# Patient Record
Sex: Female | Born: 1955
Health system: Southern US, Community
[De-identification: ages and names within clinical notes are randomized; demographics above are authoritative.]

## PROBLEM LIST (undated history)

## (undated) DIAGNOSIS — Z973 Presence of spectacles and contact lenses: Secondary | ICD-10-CM

## (undated) DIAGNOSIS — N186 End stage renal disease: Secondary | ICD-10-CM

## (undated) DIAGNOSIS — Z9289 Personal history of other medical treatment: Secondary | ICD-10-CM

## (undated) DIAGNOSIS — F329 Major depressive disorder, single episode, unspecified: Secondary | ICD-10-CM

## (undated) DIAGNOSIS — N189 Chronic kidney disease, unspecified: Secondary | ICD-10-CM

## (undated) DIAGNOSIS — H269 Unspecified cataract: Secondary | ICD-10-CM

## (undated) DIAGNOSIS — F32A Depression, unspecified: Secondary | ICD-10-CM

## (undated) DIAGNOSIS — E039 Hypothyroidism, unspecified: Secondary | ICD-10-CM

## (undated) DIAGNOSIS — C539 Malignant neoplasm of cervix uteri, unspecified: Secondary | ICD-10-CM

## (undated) DIAGNOSIS — N2581 Secondary hyperparathyroidism of renal origin: Secondary | ICD-10-CM

## (undated) DIAGNOSIS — N1330 Unspecified hydronephrosis: Secondary | ICD-10-CM

## (undated) DIAGNOSIS — I739 Peripheral vascular disease, unspecified: Secondary | ICD-10-CM

## (undated) DIAGNOSIS — D509 Iron deficiency anemia, unspecified: Secondary | ICD-10-CM

## (undated) DIAGNOSIS — G4700 Insomnia, unspecified: Secondary | ICD-10-CM

## (undated) HISTORY — DX: Peripheral vascular disease, unspecified: I73.9

## (undated) HISTORY — DX: Depression, unspecified: F32.A

## (undated) HISTORY — PX: TUBAL LIGATION: SHX77

## (undated) HISTORY — PX: URETER SURGERY: SHX823

## (undated) NOTE — *Deleted (*Deleted)
Critical lab: Hemoglobin-6.6 Dr. Cyd Silence paged.

---
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
## 1997-08-01 ENCOUNTER — Other Ambulatory Visit: Admission: RE | Admit: 1997-08-01 | Discharge: 1997-08-01 | Payer: Self-pay | Admitting: Gynecology

## 2001-04-06 ENCOUNTER — Other Ambulatory Visit: Admission: RE | Admit: 2001-04-06 | Discharge: 2001-04-06 | Payer: Self-pay | Admitting: Gynecology

## 2004-03-16 DIAGNOSIS — N1330 Unspecified hydronephrosis: Secondary | ICD-10-CM

## 2004-03-16 DIAGNOSIS — C539 Malignant neoplasm of cervix uteri, unspecified: Secondary | ICD-10-CM

## 2004-03-16 HISTORY — DX: Unspecified hydronephrosis: N13.30

## 2004-03-16 HISTORY — DX: Malignant neoplasm of cervix uteri, unspecified: C53.9

## 2004-05-29 ENCOUNTER — Encounter: Admission: RE | Admit: 2004-05-29 | Discharge: 2004-05-29 | Payer: Self-pay | Admitting: Internal Medicine

## 2004-05-29 IMAGING — US US RETROPERITONEAL COMPLETE
1 series · 14 of 25 positions shown · non-contrast
Comparison: none

CLINICAL DATA: Acute renal failure.
 ULTRASOUND OF THE KIDNEYS:
 Scans over the kidneys were performed.  There is moderate bilateral hydronephrosis present.  The ureters are dilated in to the pelvis.  The right kidney measures 10.7 cm sagittally with the left kidney measuring 11.8 cm.  The urinary bladder is grossly normal.  CT of the abdomen and pelvis are recommended to assess for the cause of bilateral hydronephrosis.

[Series 1: unknown · 0.26mm/px · 14 of 42 slices shown]
[im 1/42]
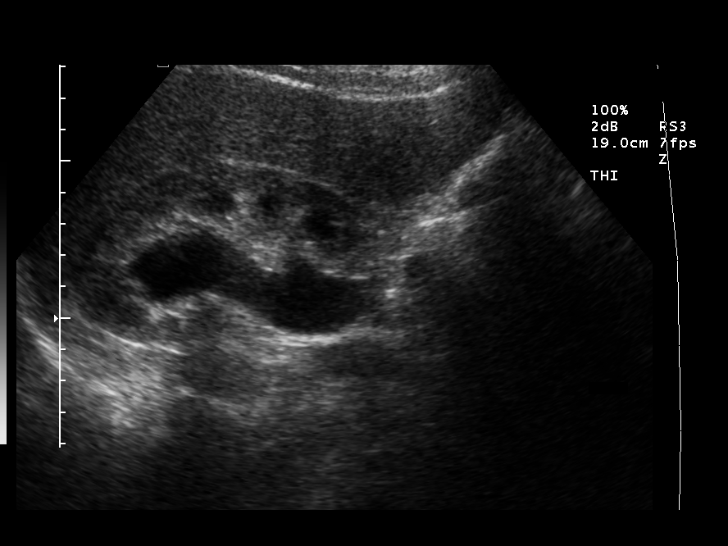
[im 4/42]
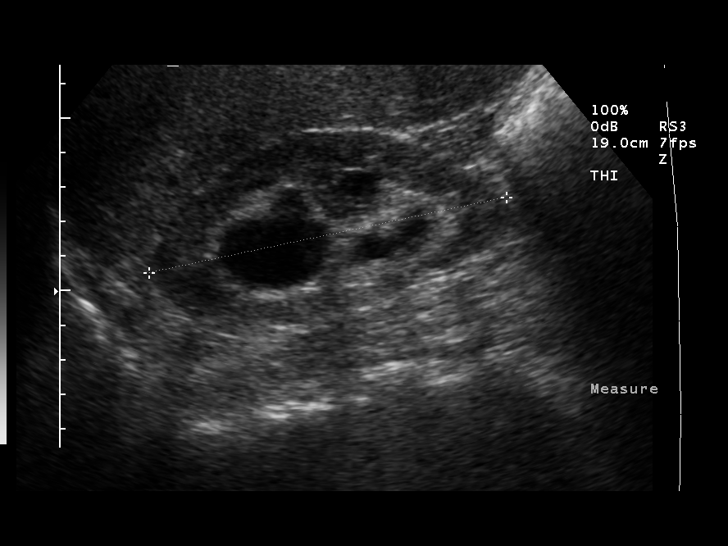
[im 7/42]
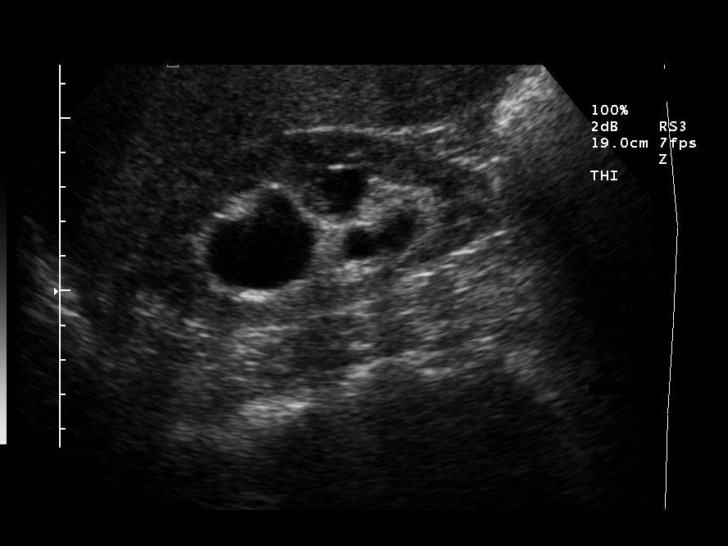
[im 11/42]
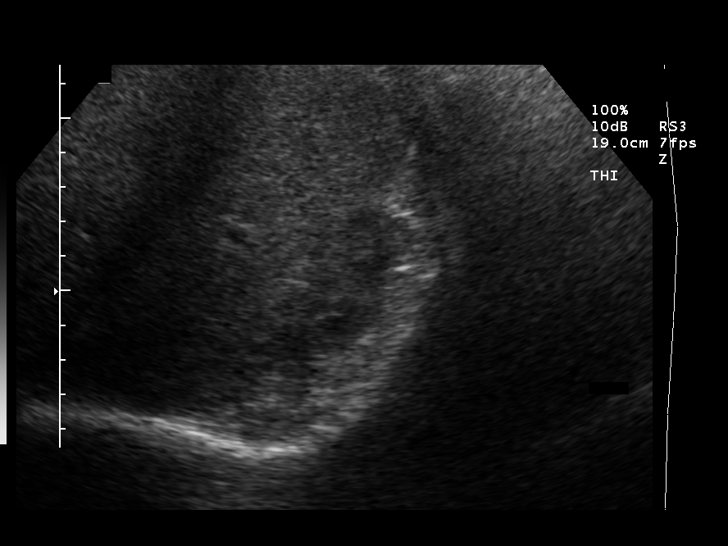
[im 14/42]
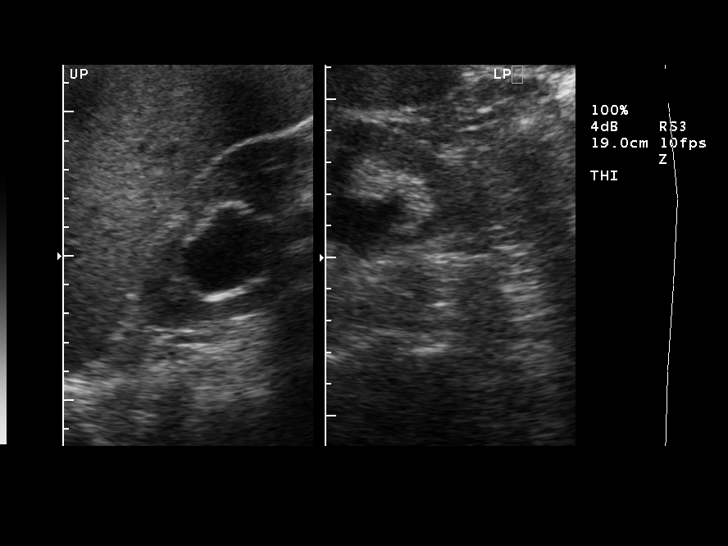
[im 16/42]
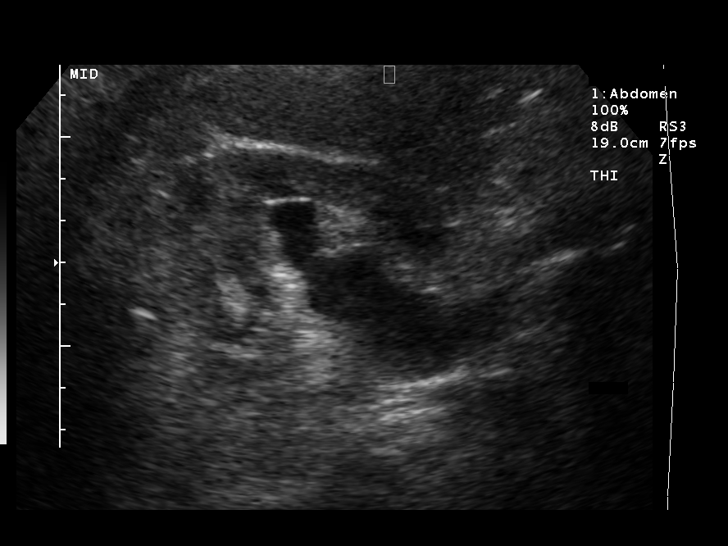
[im 19/42]
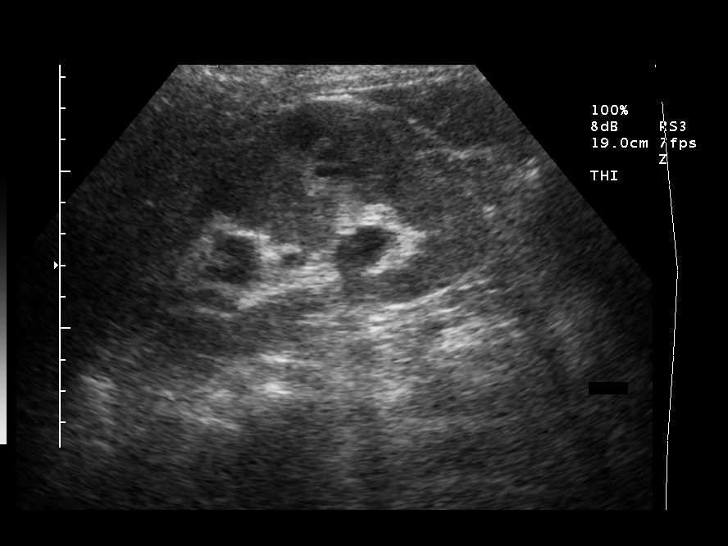
[im 23/42]
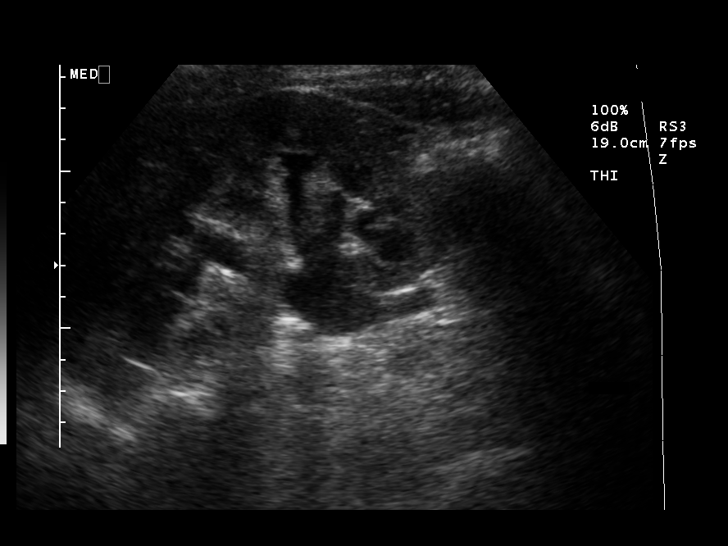
[im 26/42]
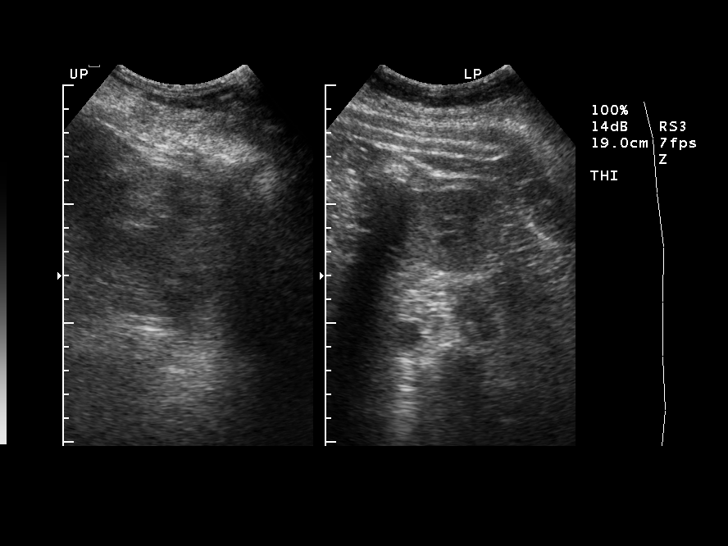
[im 28/42]
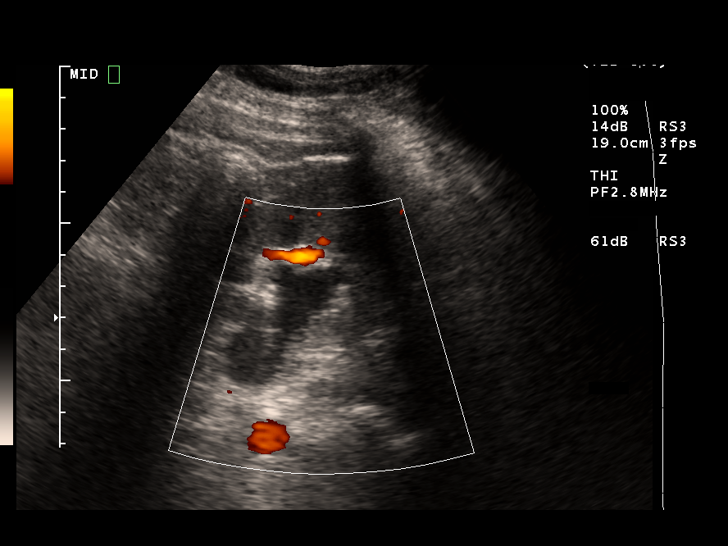
[im 31/42]
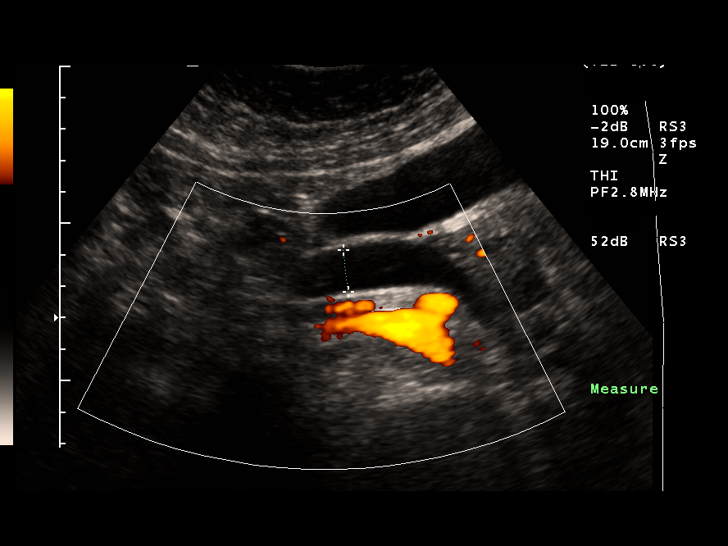
[im 35/42]
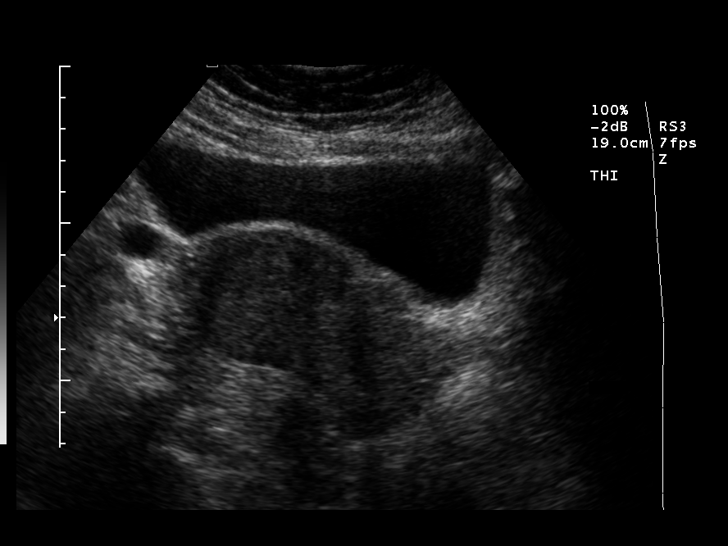
[im 38/42]
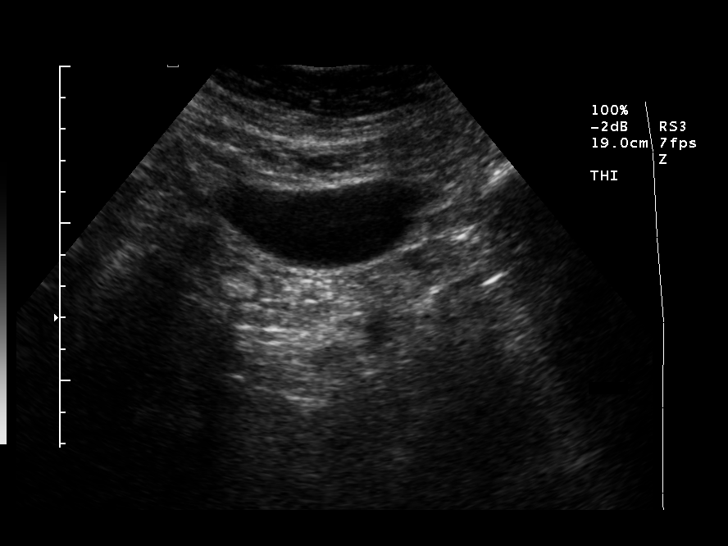
[im 42/42]
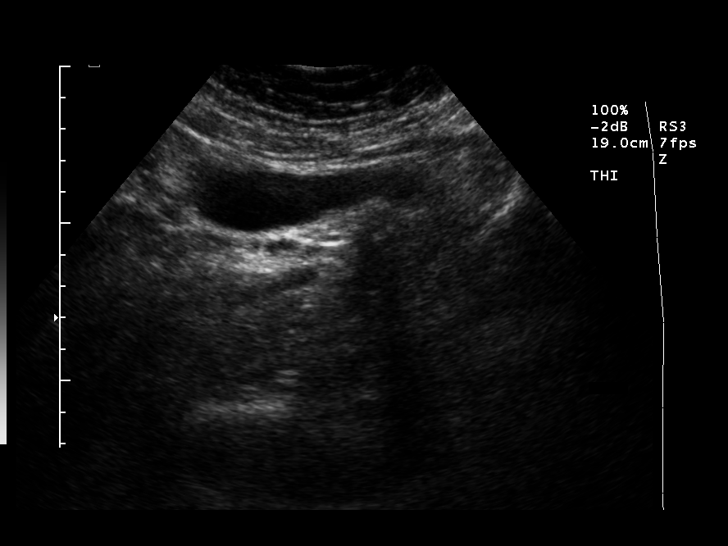

[14 of 25 positions shown; findings below may reference images not displayed]

IMPRESSION: 1.  Moderate bilateral hydronephrosis.  Suggest CT of the abdomen and pelvis to assess further. 
 2.  Incidental gallstones.

## 2004-05-29 IMAGING — CT CT PELVIS W/O CM
1 of 2 series · 15 of 32 positions shown, 19 images · IV contrast (agent unspecified)
Comparison: none

CLINICAL DATA: Acute renal failure. 
ABDOMINAL CT ? NO CONTRAST:

[Series 2: — · axial · 0.76mm/px · z∈[-384,+11]mm · 15 of 87 slices shown, 19 images]
[im 4/87  soft-tissue]
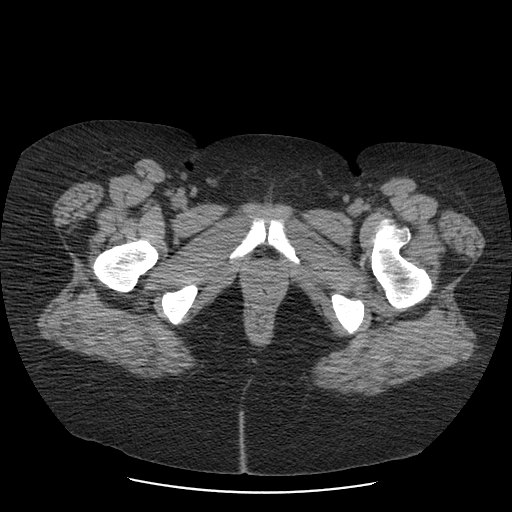
[im 4/87  bone]
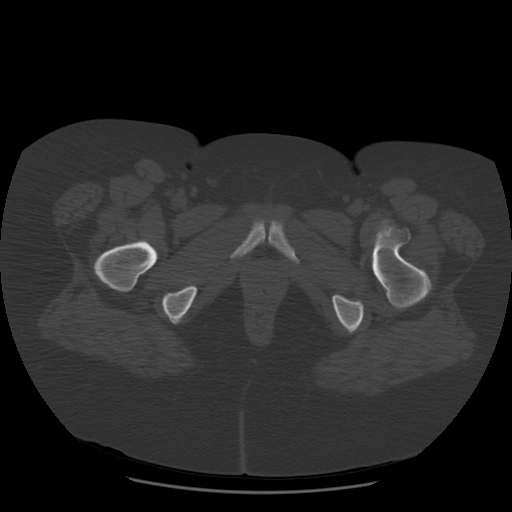
[im 12/87  soft-tissue]
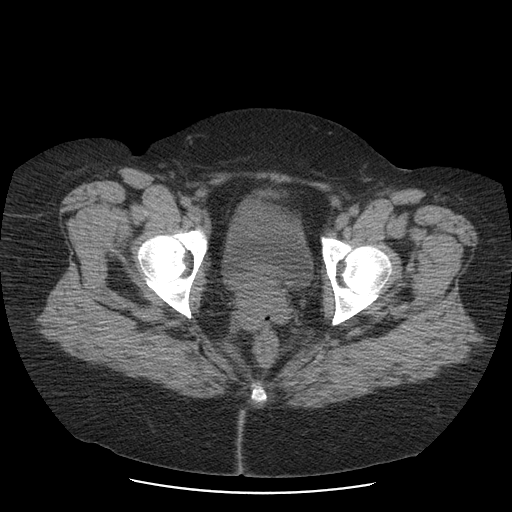
[im 19/87  soft-tissue]
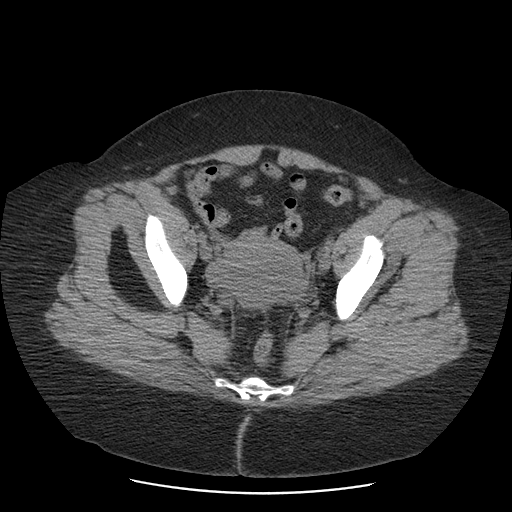
[im 23/87  soft-tissue]
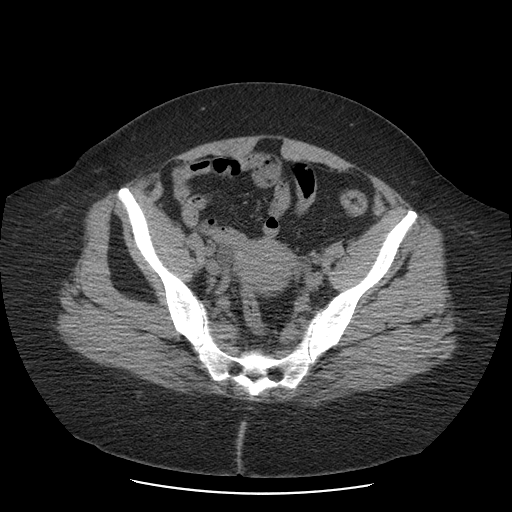
[im 30/87  soft-tissue]
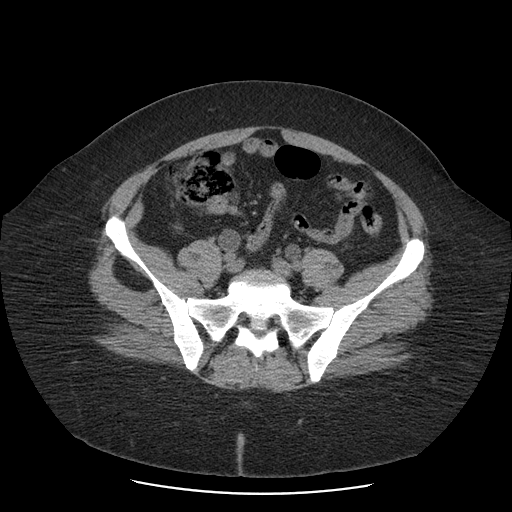
[im 38/87  soft-tissue]
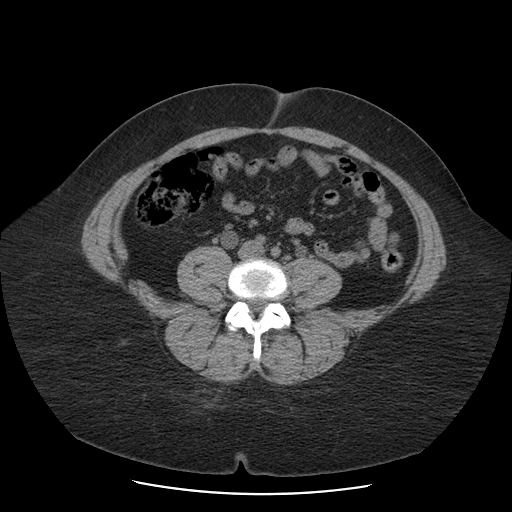
[im 45/87  soft-tissue]
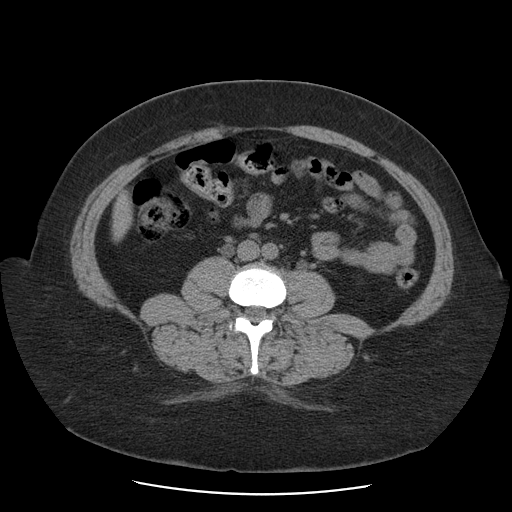
[im 49/87  soft-tissue]
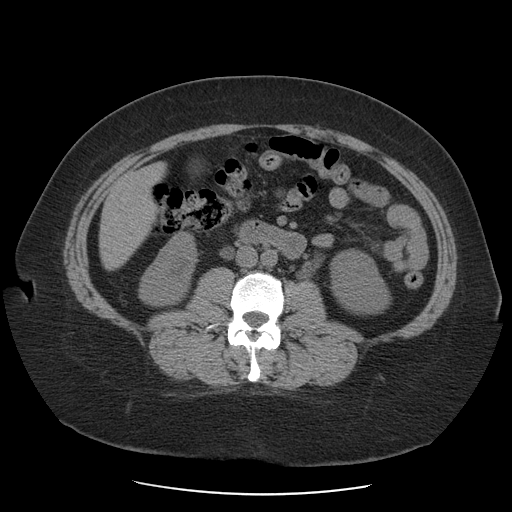
[im 57/87  soft-tissue]
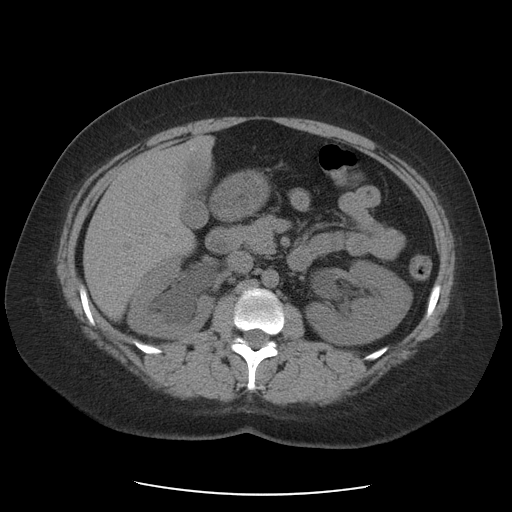
[im 57/87  bone]
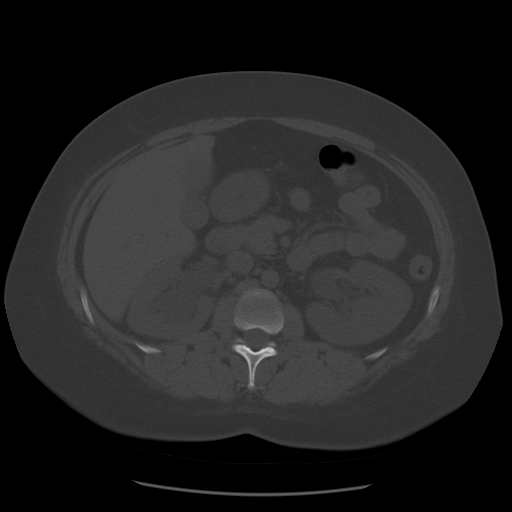
[im 64/87  soft-tissue]
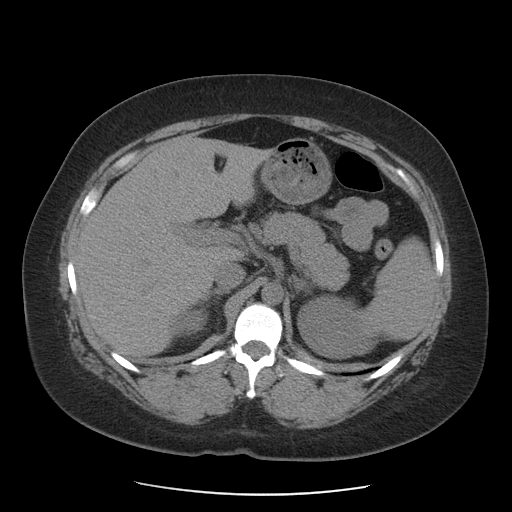
[im 68/87  soft-tissue]
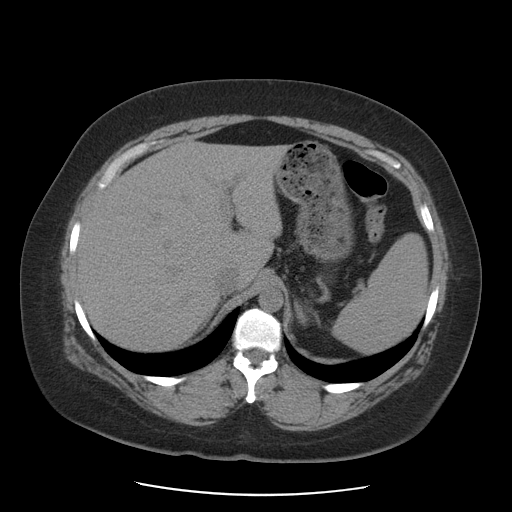
[im 72/87  lung]
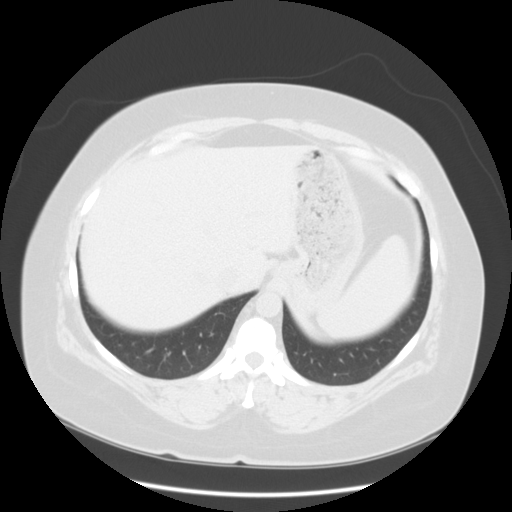
[im 75/87  soft-tissue]
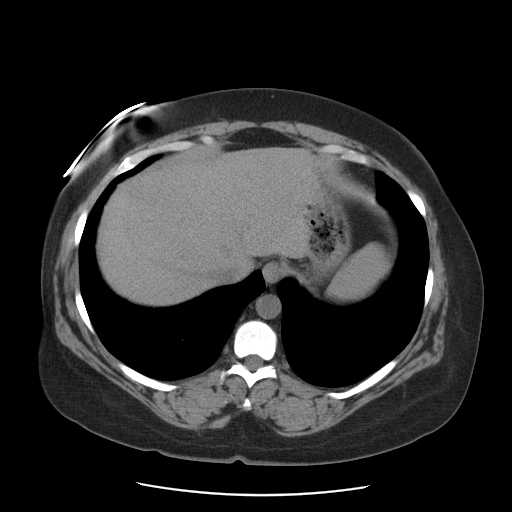
[im 75/87  lung]
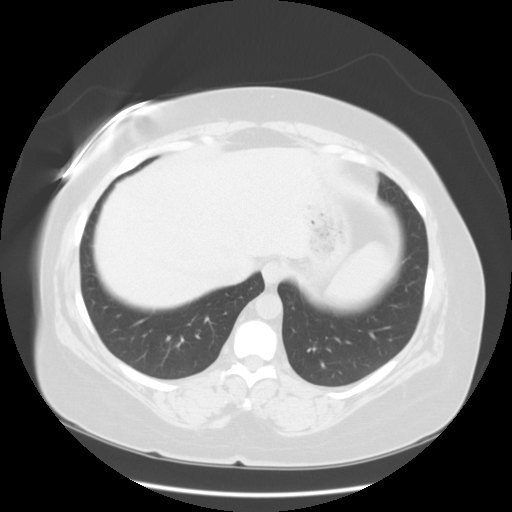
[im 79/87  lung]
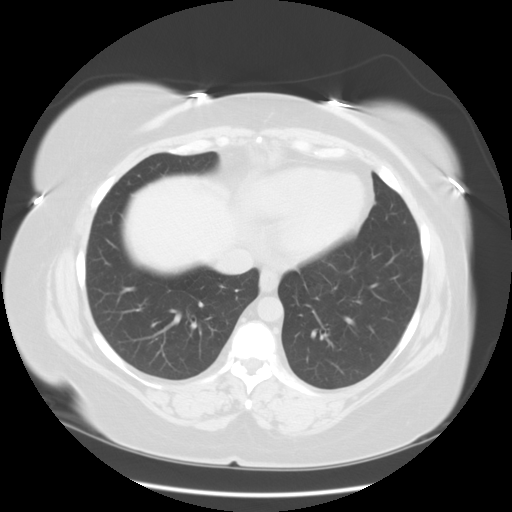
[im 83/87  soft-tissue]
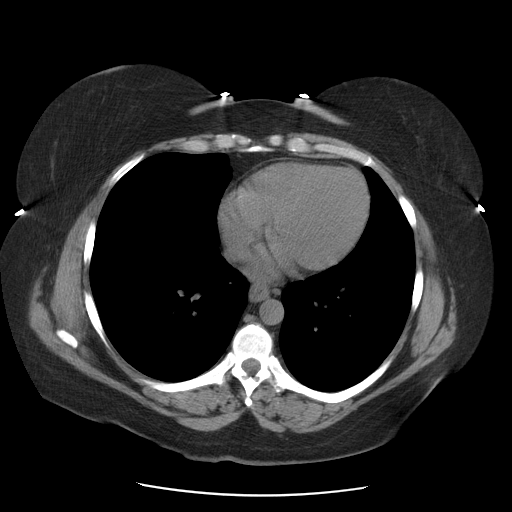
[im 83/87  lung]
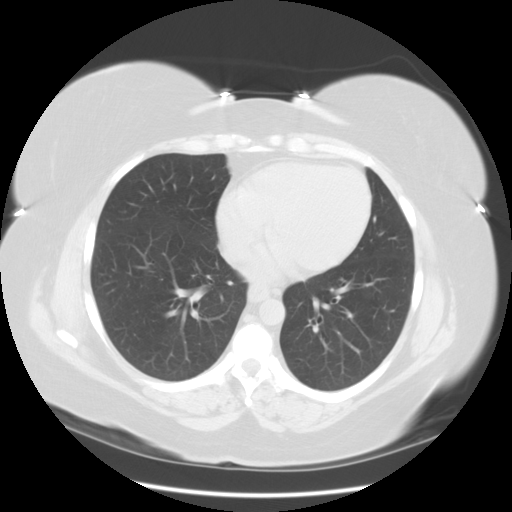

[15 of 32 positions shown; findings below may reference images not displayed]

FINDINGS: CT urogram technique with no oral nor IV contrast demonstrates marked bilateral hydronephrosis and hydroureter to the level of the distal pelvis.  Etiology of obstruction is not currently determined with no CT evidence for obstructing distal ureteral calculi.  Non-obstructing 2.2 cm gallbladder neck gallstone is seen.  Clear lung bases are noted.  The remaining abdominal organs appear normal allowing for no contrast with no adenopathy.  Right gluteal lipoma is seen interposed between the right gluteus medius and minimus.
IMPRESSION: 1.  Marked bilateral hydronephrosis and hydroureter with etiology of distal ureteral obstruction not determined on current study.
2.  Non-obstructing 2.2 cm gallstone.
3.  Incidental right gluteal lipoma.
4.  Otherwise negative.
PELVIC CT ? NO CONTRAST:
FINDINGS: CT urogram technique with no oral nor IV contrast demonstrates dilated right greater than left distal ureters (image 71) with no obstructing mass nor calculus identified.  The etiology of the obstruction is not determined on the current study.  No other significant abnormality is seen with specifically normal air-filled appendix noted and right gluteal lipoma seen.
IMPRESSION: 1.  Distal ureterectasis with etiology not determined on pelvic CT.
2.  Right gluteal lipoma.

## 2004-05-30 ENCOUNTER — Inpatient Hospital Stay (HOSPITAL_COMMUNITY): Admission: EM | Admit: 2004-05-30 | Discharge: 2004-06-03 | Payer: Self-pay | Admitting: *Deleted

## 2004-06-01 IMAGING — US US PELVIS COMPLETE MODIFY
1 series · 14 of 25 positions shown · non-contrast
Comparison: none

CLINICAL DATA: pelvic pain; acute renal failure secondary to ureteral obstruction; bilateral ureteral stent placement
 TRANSVAGINAL NON-OB/PELVIS ULTRASOUND:
 The uterus is normal in size and contour measuring 8.1 x 5.5 x 5.2cm in size.  There is a sonolucent area seen within the fundal portion of the uterus.  This may be fluid located superiorly within the endometrial cavity.  This also may represent a relatively hypoechoic uterine fibroid.  Followup study over time would be helpful (4 to 6 weeks) with attenuation to this area.  The right ovary is normal in size and contour.  The left ovary could not be visualized.  There is no free pelvic fluid.  The endometrial stripe thickness measures 7mm.

[Series 1: gyn · 0.38mm/px · 14 of 39 slices shown]
[im 1/39]
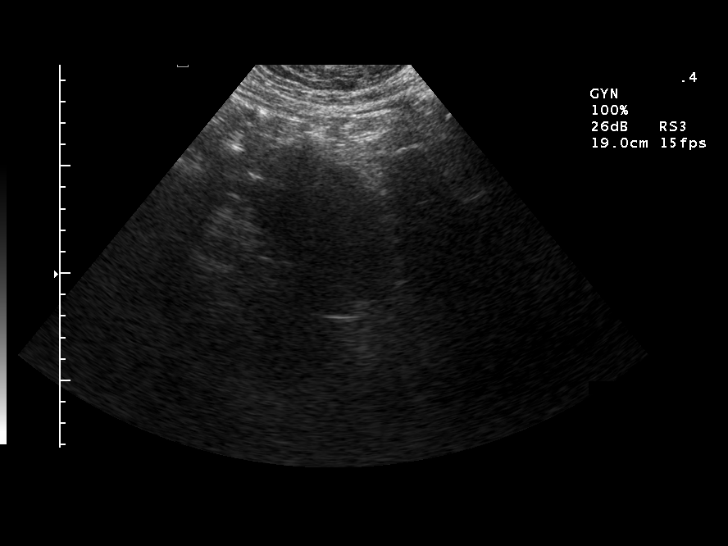
[im 4/39]
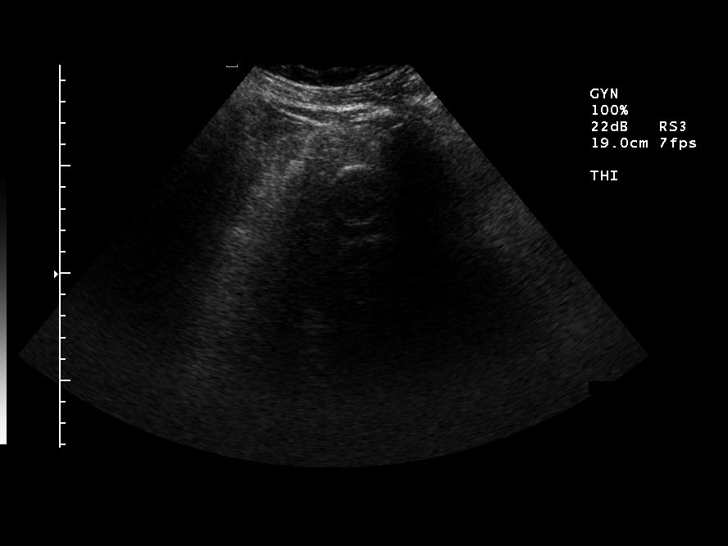
[im 7/39]
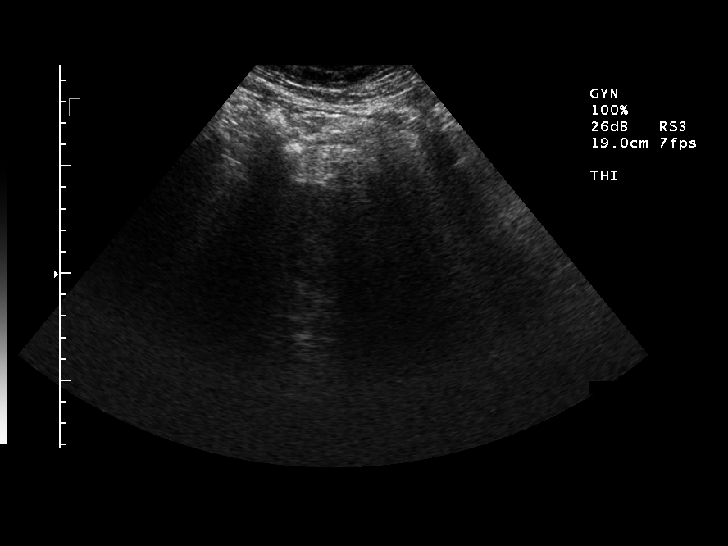
[im 10/39]
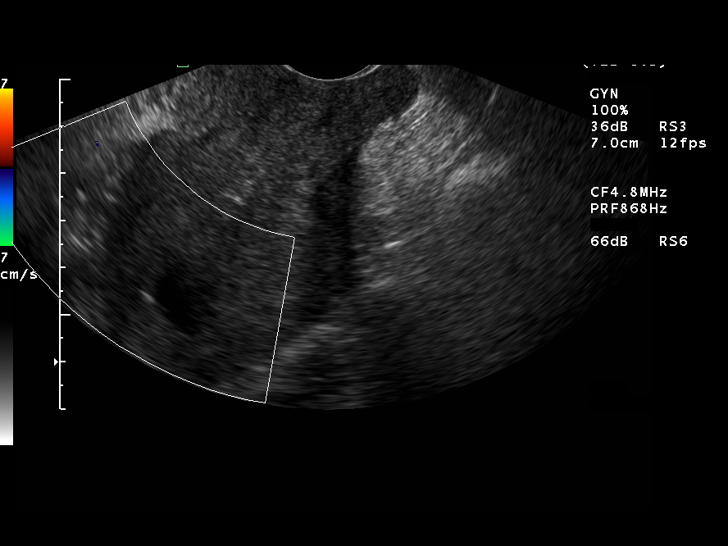
[im 13/39]
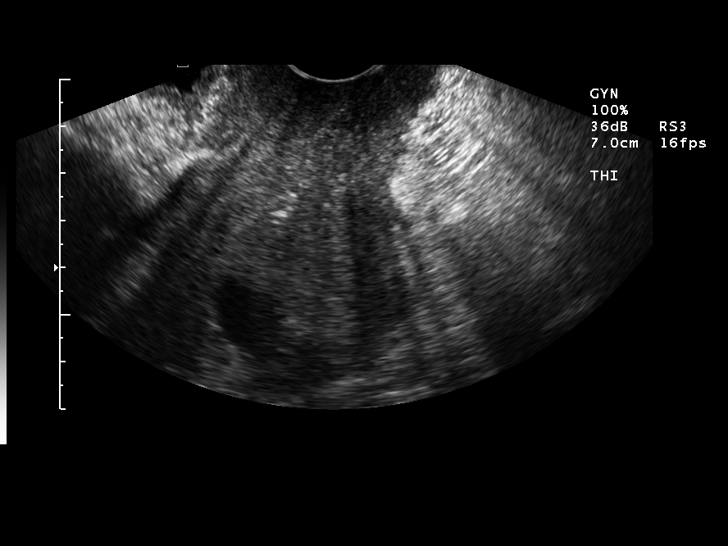
[im 15/39]
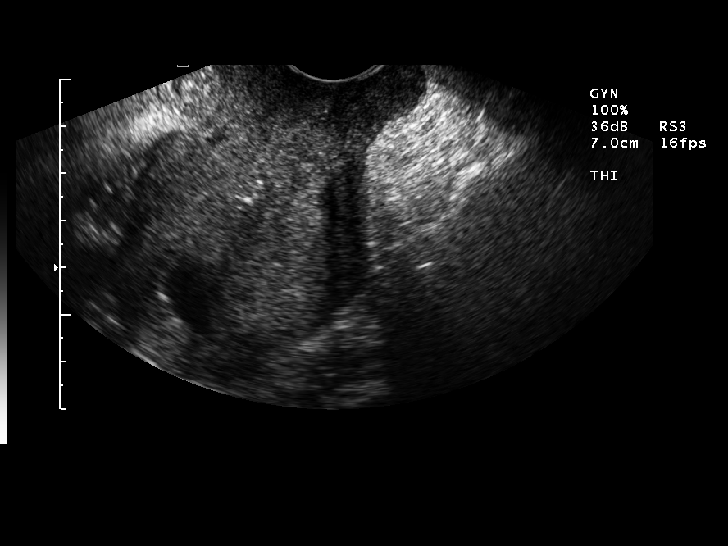
[im 18/39]
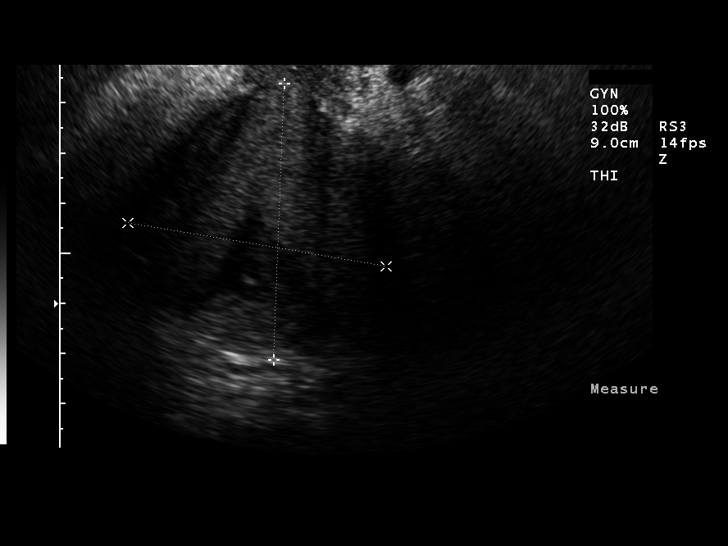
[im 21/39]
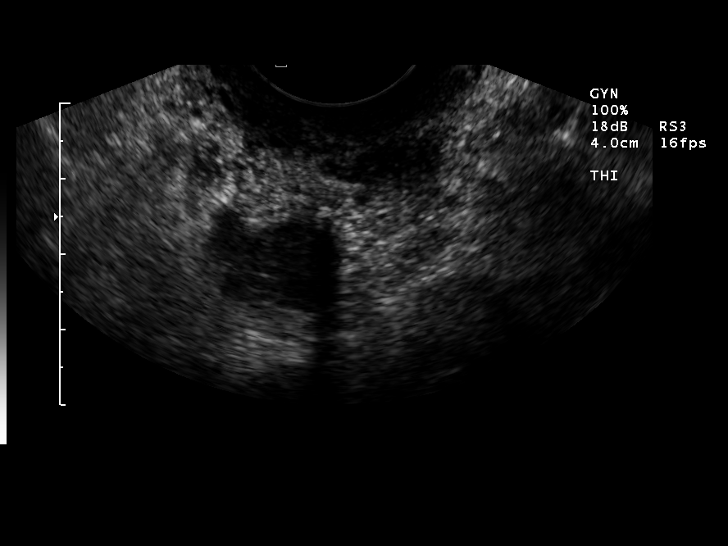
[im 24/39]
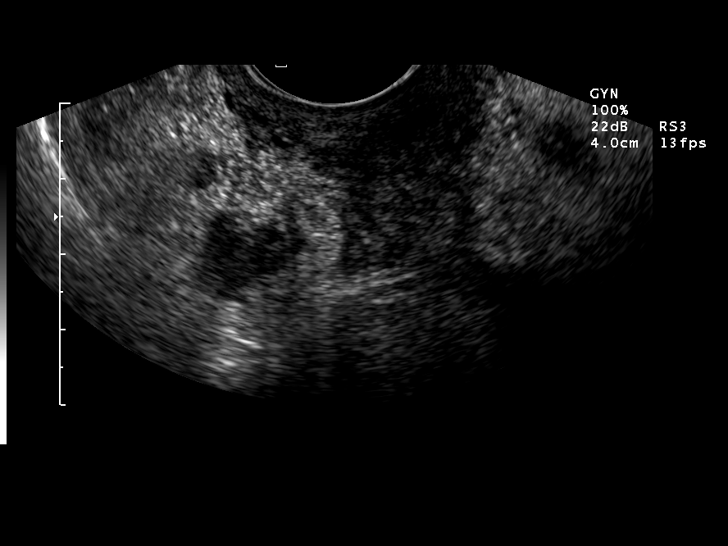
[im 26/39]
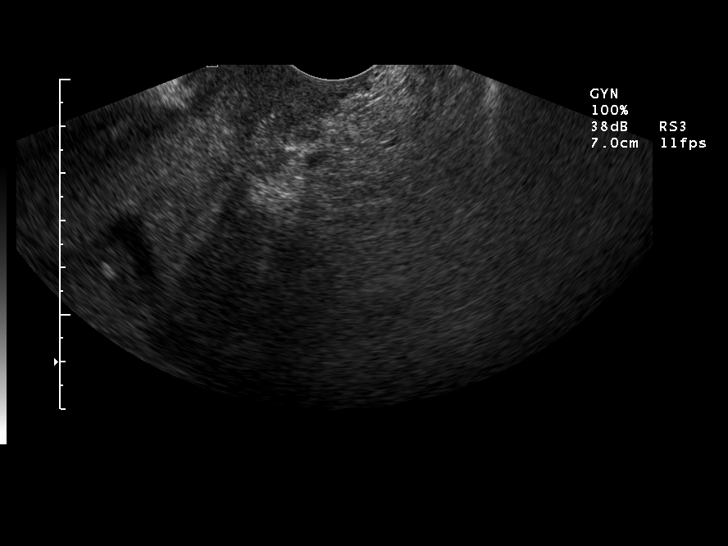
[im 29/39]
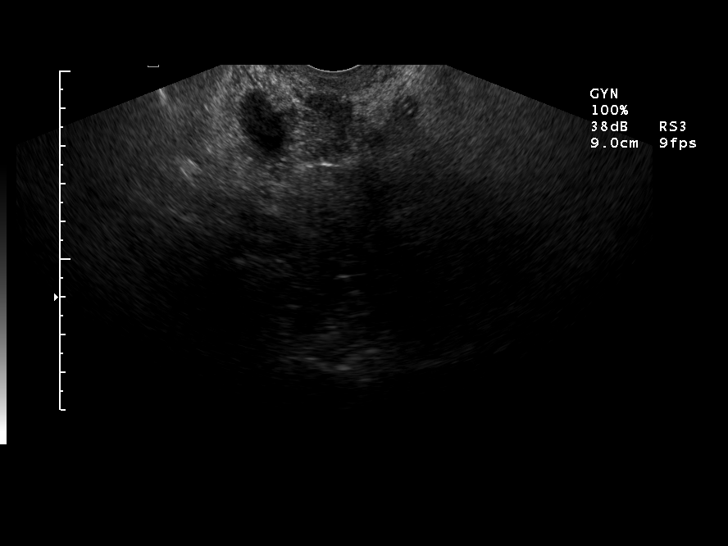
[im 32/39]
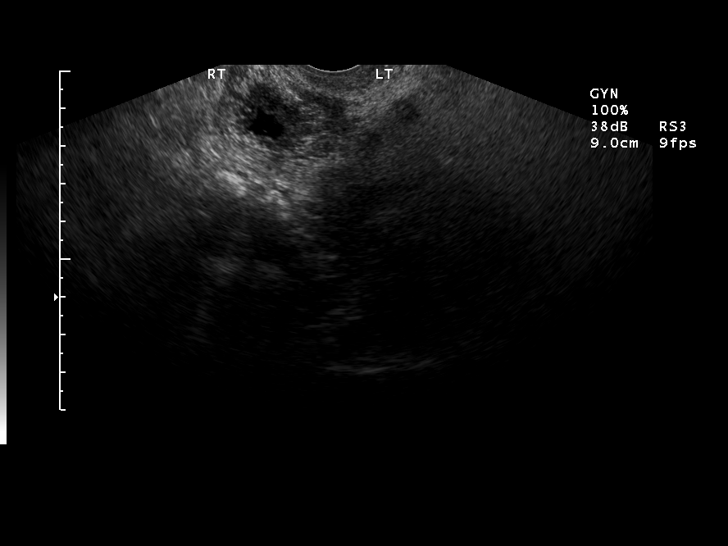
[im 35/39]
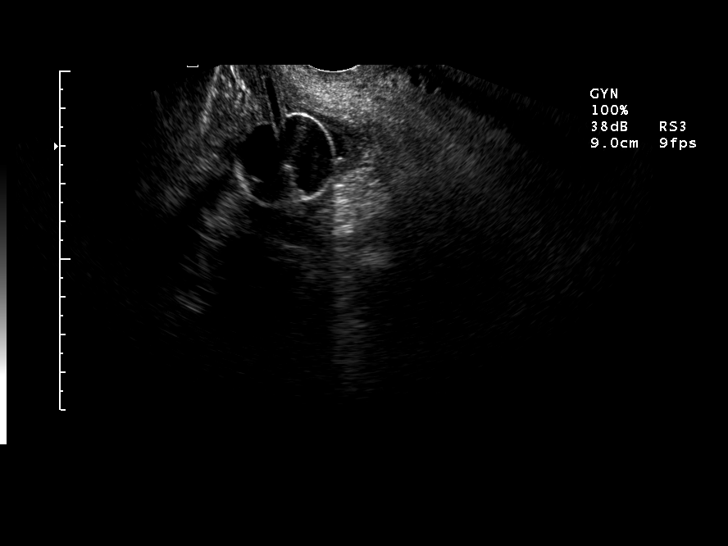
[im 39/39]
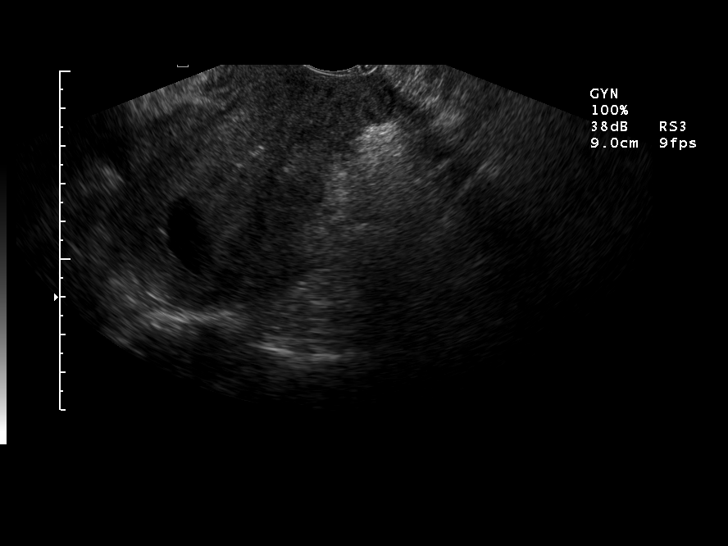

[14 of 25 positions shown; findings below may reference images not displayed]

IMPRESSION: 2.1 x 1.2cm in size hypoechoic area seen within the fundal portion of the uterus.  This may represent fluid within the superior portion of the endometrial cavity or possibly a hypoechoic fibroid.  Followup study in 4 to 6 weeks may be helpful to determine if this persists or resolves.  Normal-appearing right ovary with nonvisualization of the left ovary.

## 2004-06-04 ENCOUNTER — Other Ambulatory Visit: Admission: RE | Admit: 2004-06-04 | Discharge: 2004-06-04 | Payer: Self-pay | Admitting: Gynecology

## 2004-06-09 ENCOUNTER — Ambulatory Visit: Admission: RE | Admit: 2004-06-09 | Discharge: 2004-08-29 | Payer: Self-pay | Admitting: Radiation Oncology

## 2004-06-12 ENCOUNTER — Ambulatory Visit: Payer: Self-pay | Admitting: Internal Medicine

## 2004-07-25 IMAGING — CR DG CHEST 2V
2 series · 2 of 2 positions shown · non-contrast
Comparison: none

CLINICAL DATA: Cervical cancer, pre-op chest. 
 CHEST - 2 VIEW: 
 The lungs are clear and the heart and mediastinal structures are normal.

[view not recorded (1 of 2)]
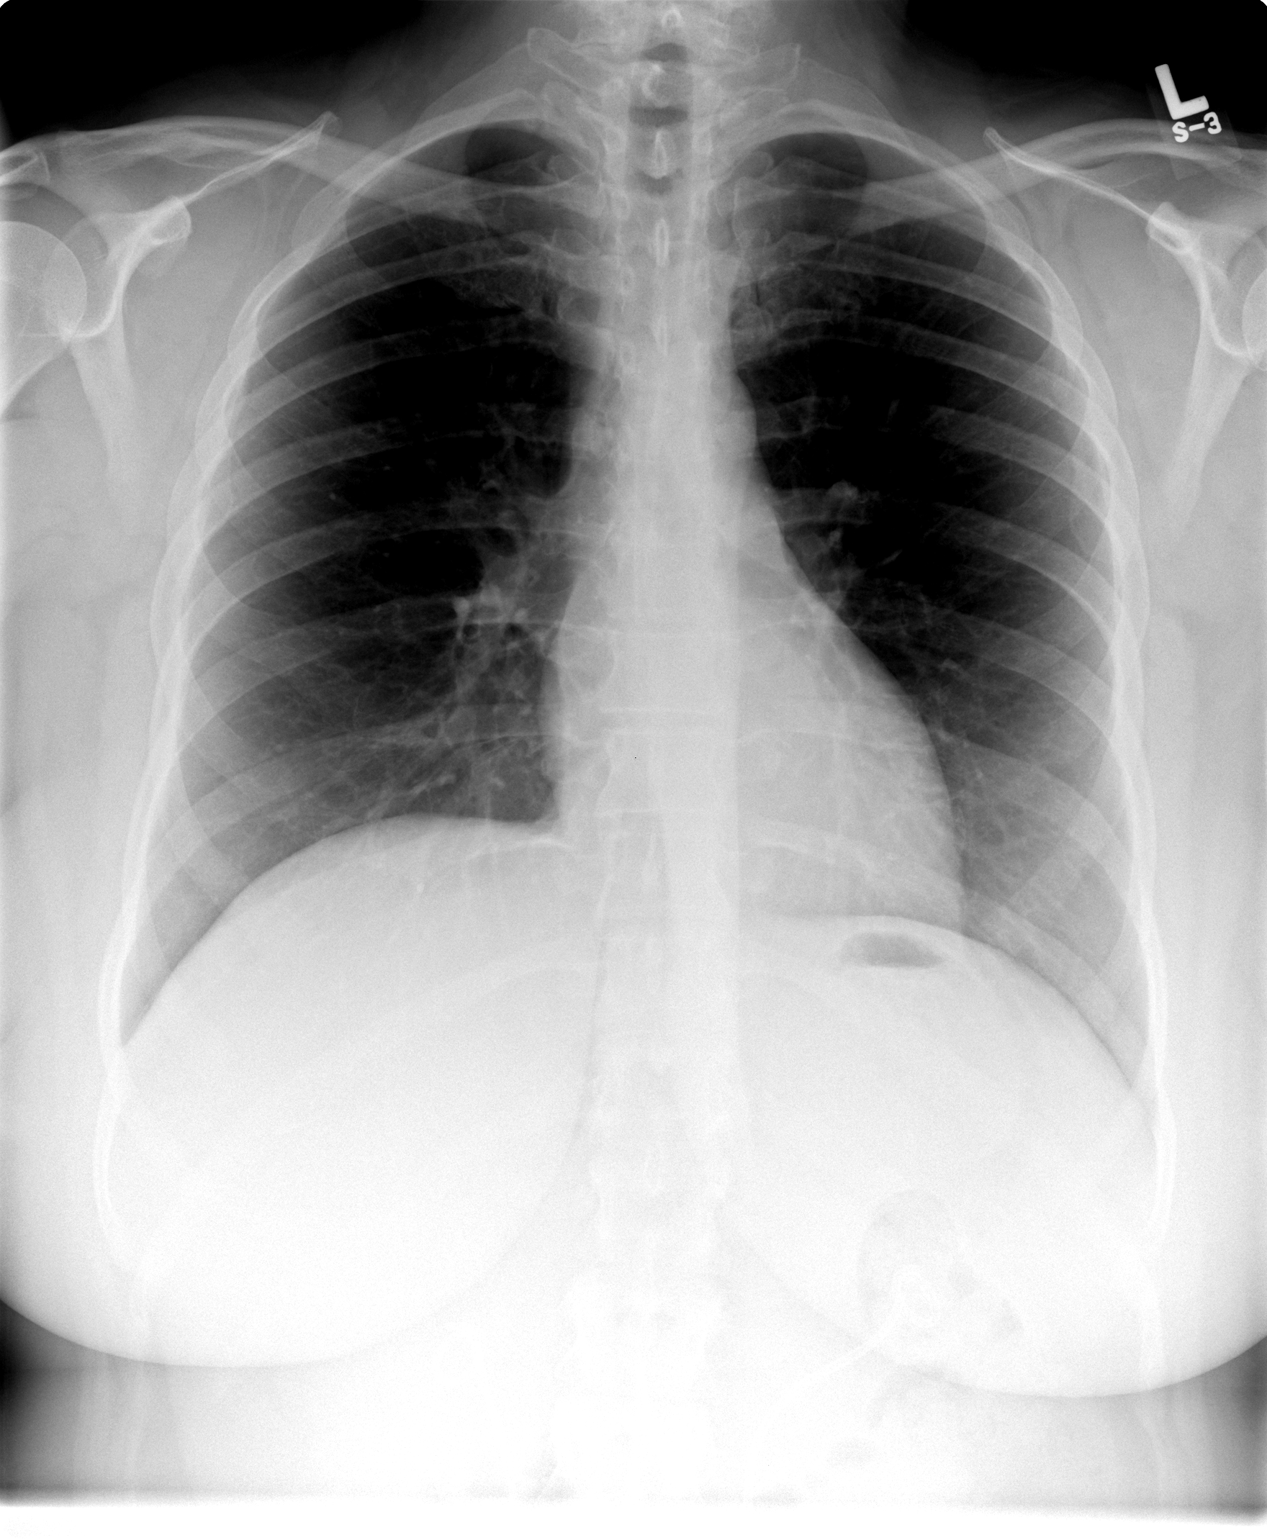

[view not recorded (2 of 2)]
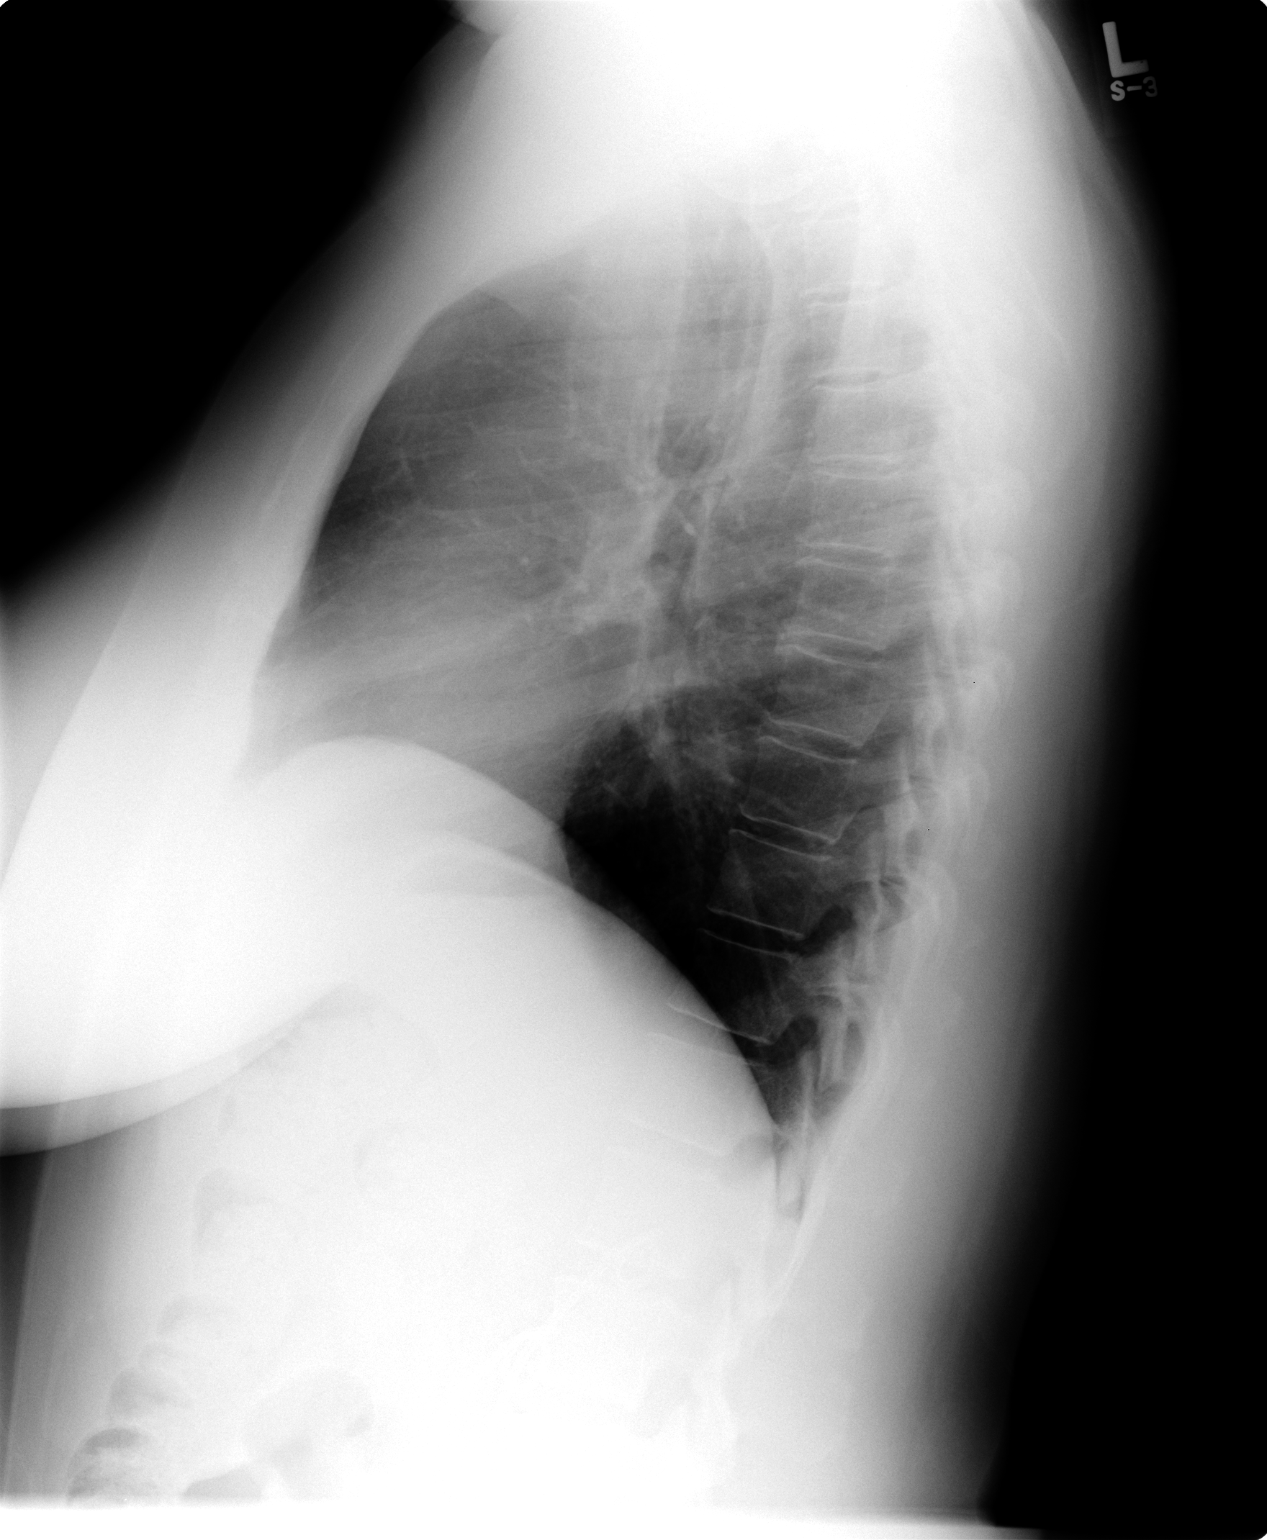

[2 of 2 positions shown; findings below may reference images not displayed]

IMPRESSION: No evidence for active chest disease.

## 2004-07-29 ENCOUNTER — Inpatient Hospital Stay (HOSPITAL_COMMUNITY): Admission: RE | Admit: 2004-07-29 | Discharge: 2004-08-01 | Payer: Self-pay | Admitting: Radiation Oncology

## 2004-07-29 IMAGING — US US INTRAOPERATIVE - NO REPORT
1 series · 10 of 10 positions shown · non-contrast
Comparison: none

CLINICAL DATA: 48 year old female; cervical carcinoma.  
 LIMITED INTRAOPERATIVE ULTRASOUND:
 Intraoperative transabdominal ultrasound was provided for cesium implant assistance.  The procedure was performed by Dr. CHAI.  No radiologist was present.
 Imaging reveals a Foley catheter in the bladder which is mildly distended.  The uterus is noted posteriorly with increased echogenicity along the lower uterine segment and endometrium related to the cesium implant.

[Series 1: unknown · 0.33mm/px · 10 of 10 slices shown]
[im 1/10]
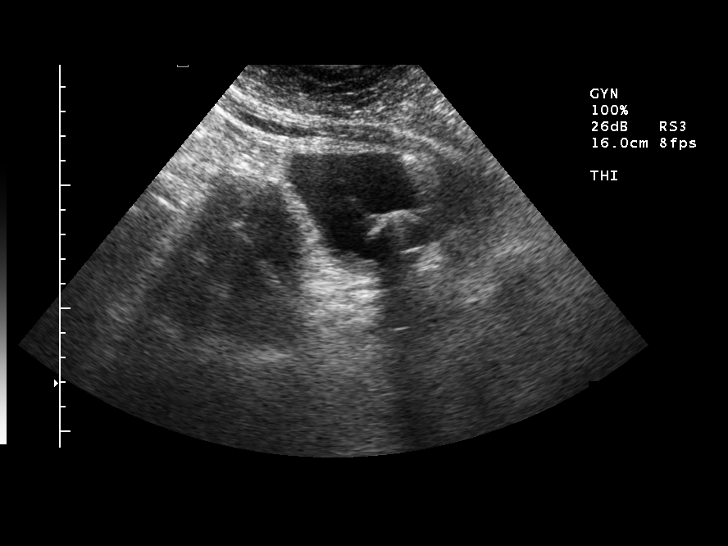
[im 2/10]
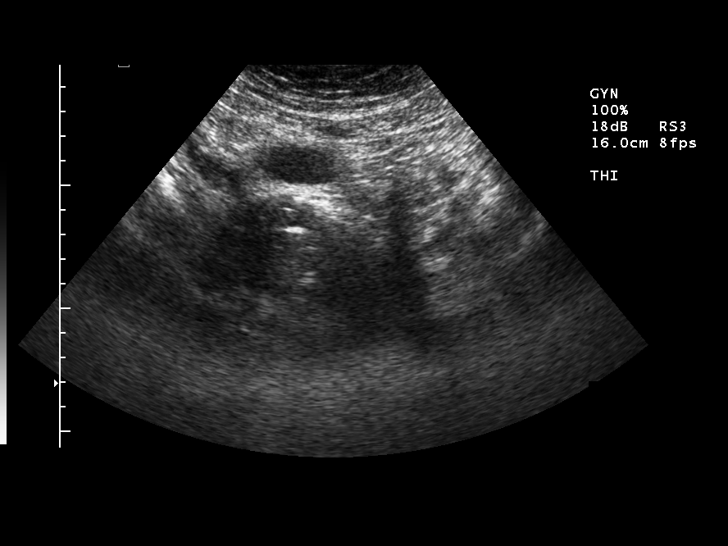
[im 3/10]
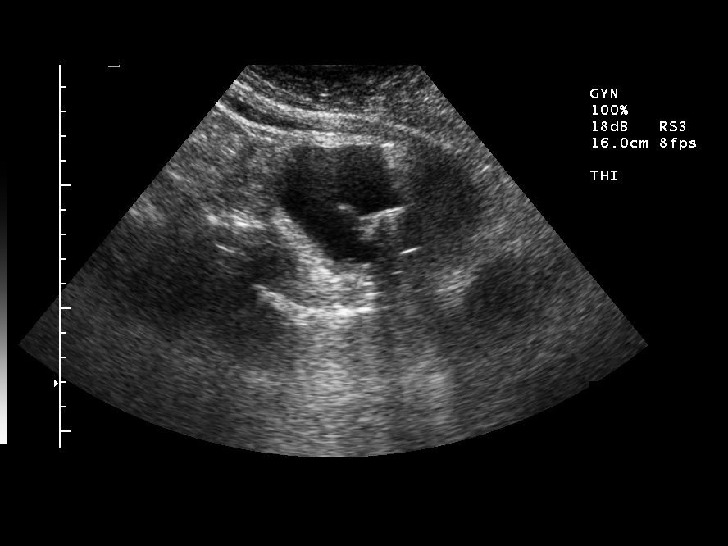
[im 4/10]
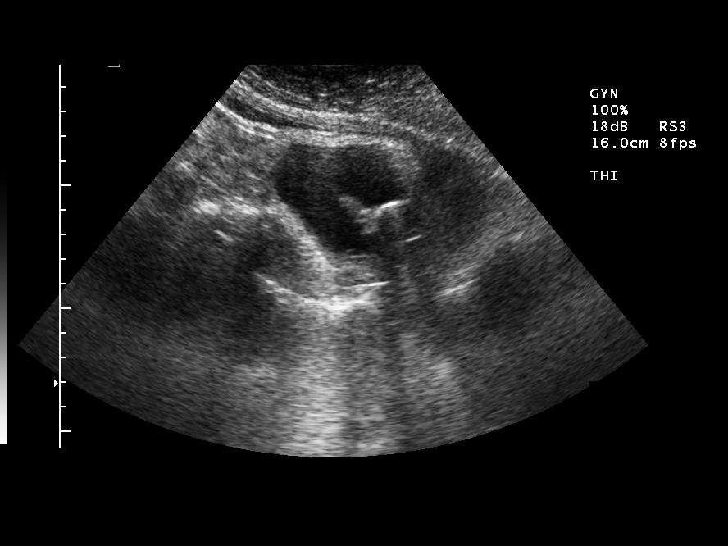
[im 5/10]
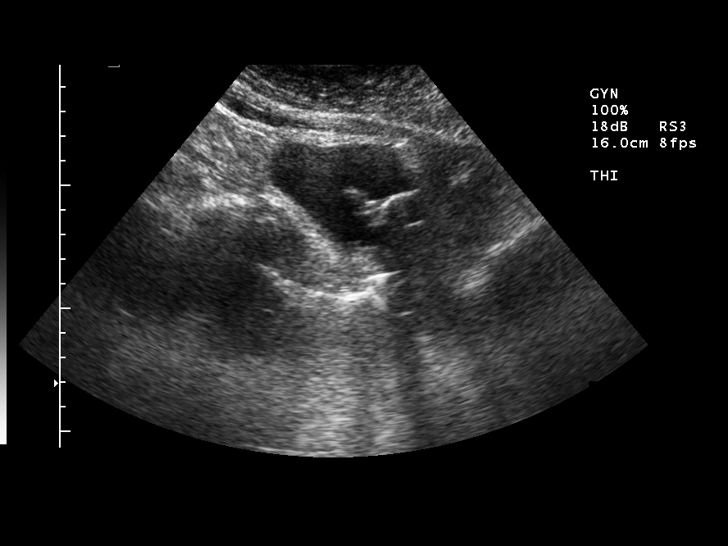
[im 6/10]
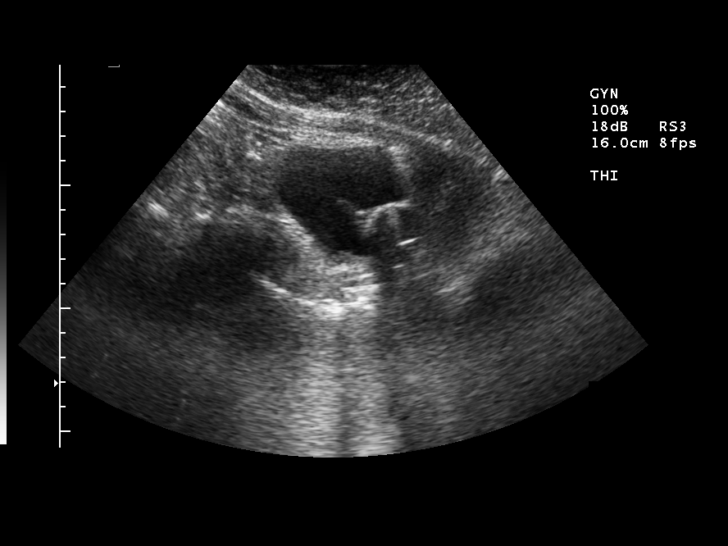
[im 7/10]
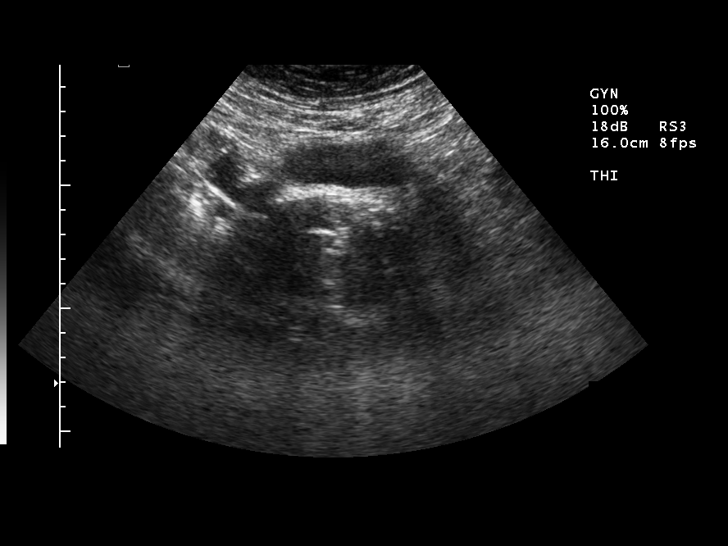
[im 8/10]
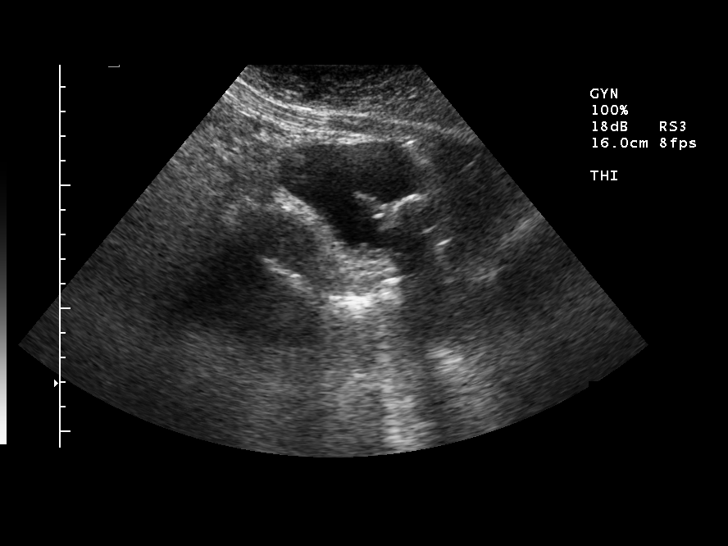
[im 9/10]
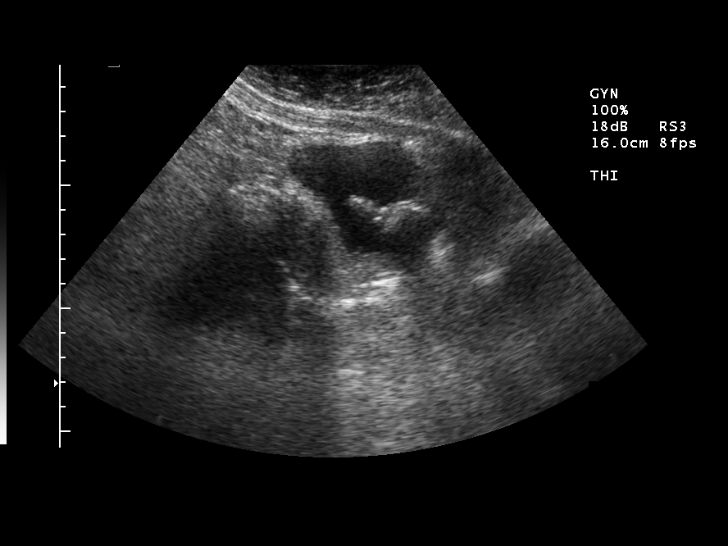
[im 10/10]
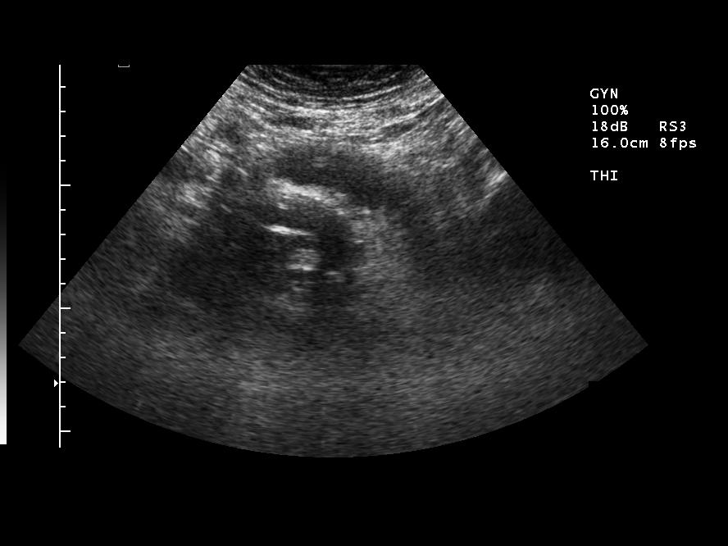

[10 of 10 positions shown; findings below may reference images not displayed]

IMPRESSION: Intraoperative ultrasound assistance for cesium implant performed by Dr. CHAI.

## 2004-08-08 ENCOUNTER — Ambulatory Visit: Payer: Self-pay | Admitting: Internal Medicine

## 2004-09-24 ENCOUNTER — Ambulatory Visit: Admission: RE | Admit: 2004-09-24 | Discharge: 2004-09-24 | Payer: Self-pay | Admitting: Radiation Oncology

## 2004-10-01 ENCOUNTER — Ambulatory Visit: Admission: RE | Admit: 2004-10-01 | Discharge: 2004-10-01 | Payer: Self-pay | Admitting: Radiation Oncology

## 2004-10-03 ENCOUNTER — Ambulatory Visit (HOSPITAL_COMMUNITY): Admission: RE | Admit: 2004-10-03 | Discharge: 2004-10-03 | Payer: Self-pay | Admitting: Urology

## 2004-10-03 ENCOUNTER — Ambulatory Visit (HOSPITAL_BASED_OUTPATIENT_CLINIC_OR_DEPARTMENT_OTHER): Admission: RE | Admit: 2004-10-03 | Discharge: 2004-10-03 | Payer: Self-pay | Admitting: Urology

## 2005-02-12 ENCOUNTER — Ambulatory Visit (HOSPITAL_COMMUNITY): Admission: RE | Admit: 2005-02-12 | Discharge: 2005-02-12 | Payer: Self-pay | Admitting: Urology

## 2005-02-12 ENCOUNTER — Ambulatory Visit (HOSPITAL_BASED_OUTPATIENT_CLINIC_OR_DEPARTMENT_OTHER): Admission: RE | Admit: 2005-02-12 | Discharge: 2005-02-12 | Payer: Self-pay | Admitting: Urology

## 2005-03-06 ENCOUNTER — Other Ambulatory Visit: Admission: RE | Admit: 2005-03-06 | Discharge: 2005-03-06 | Payer: Self-pay | Admitting: Radiation Oncology

## 2006-08-29 ENCOUNTER — Inpatient Hospital Stay (HOSPITAL_COMMUNITY): Admission: EM | Admit: 2006-08-29 | Discharge: 2006-09-03 | Payer: Self-pay | Admitting: Emergency Medicine

## 2006-08-29 IMAGING — CT CT PELVIS W/O CM
2 of 3 series · 16 of 46 positions shown, 18 images · IV contrast (agent unspecified)
Comparison: CT abdomen and pelvis [DATE].

CLINICAL DATA: Patient with history of cervical cancer causing urinary tract obstruction.  Ureteral tents in place.  Two days of new left flank pain.  
ABDOMEN CT WITHOUT CONTRAST:
TECHNIQUE: Multidetector CT imaging of the abdomen was performed following the standard protocol without IV contrast.
TECHNIQUE: Multidetector CT imaging of the pelvis was performed following the standard protocol without IV contrast.

[Series 2: stone_wo 5.0 b40f st · axial · 0.55mm/px · z∈[-462,-146]mm · 13 of 91 slices shown, 15 images]
[im 6/91  soft-tissue]
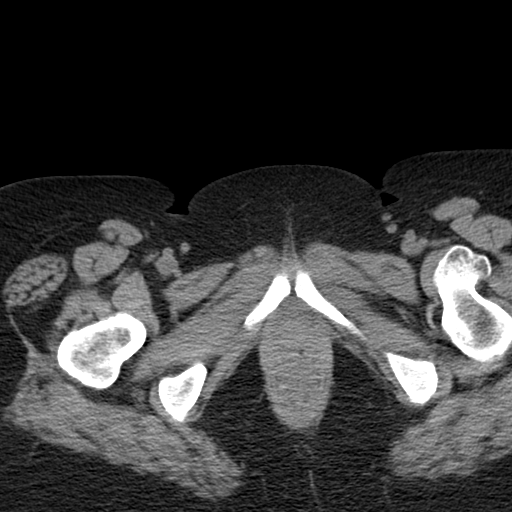
[im 6/91  bone]
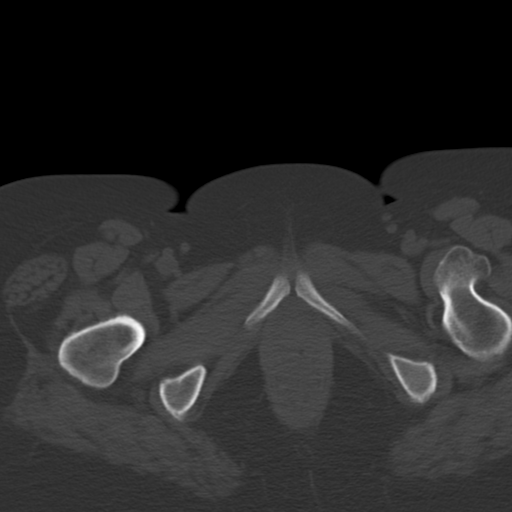
[im 12/91  soft-tissue]
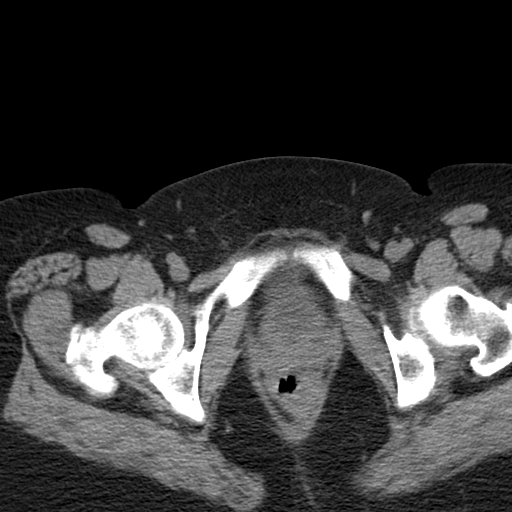
[im 18/91  soft-tissue]
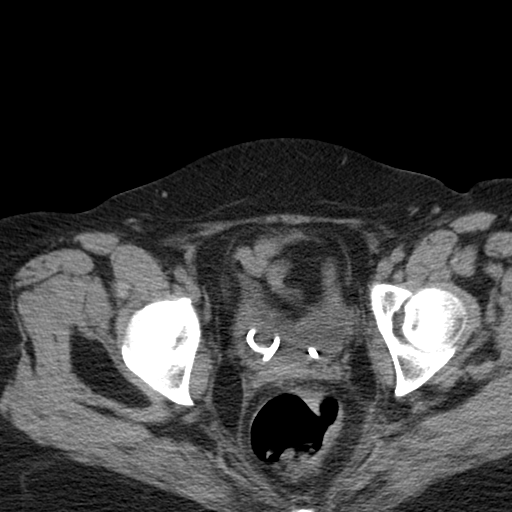
[im 27/91  soft-tissue]
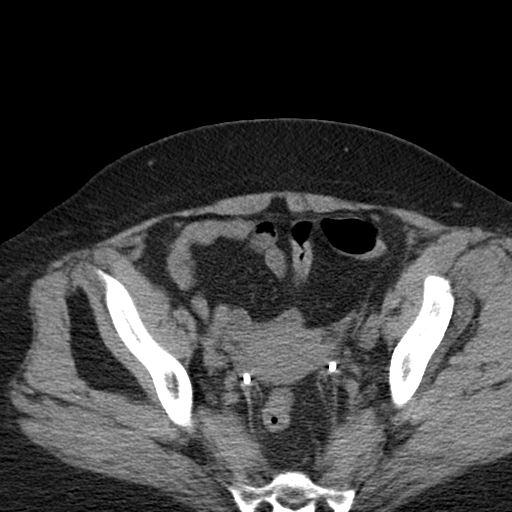
[im 32/91  soft-tissue]
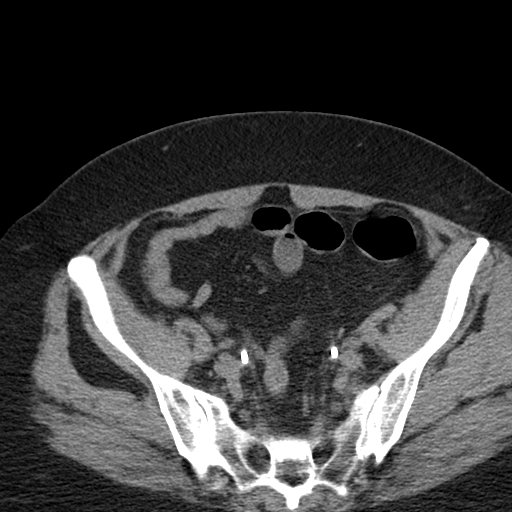
[im 38/91  soft-tissue]
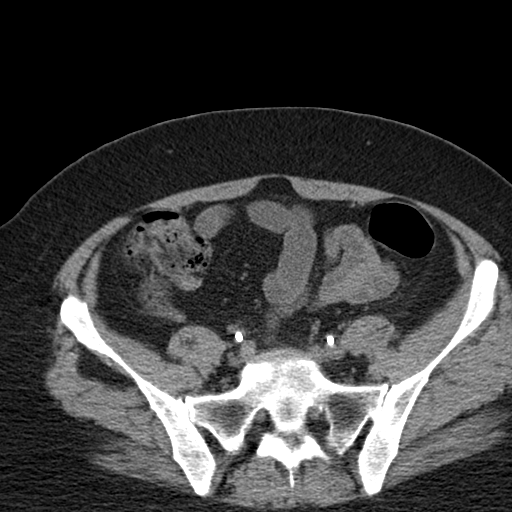
[im 47/91  soft-tissue]
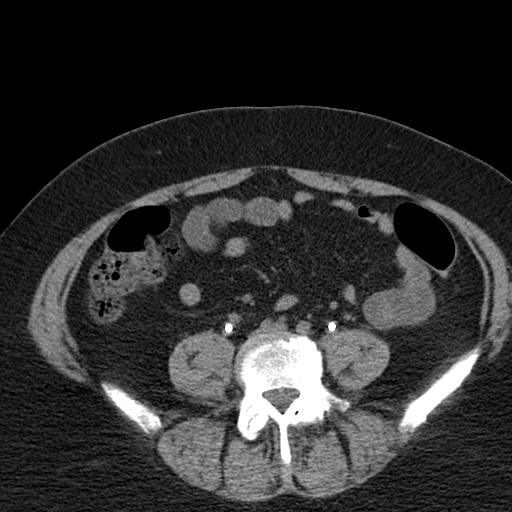
[im 53/91  soft-tissue]
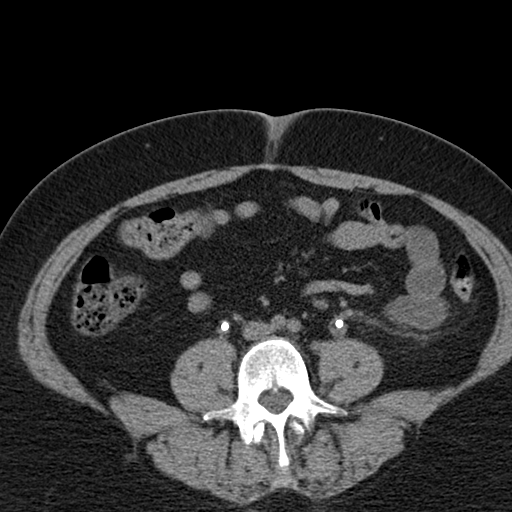
[im 59/91  soft-tissue]
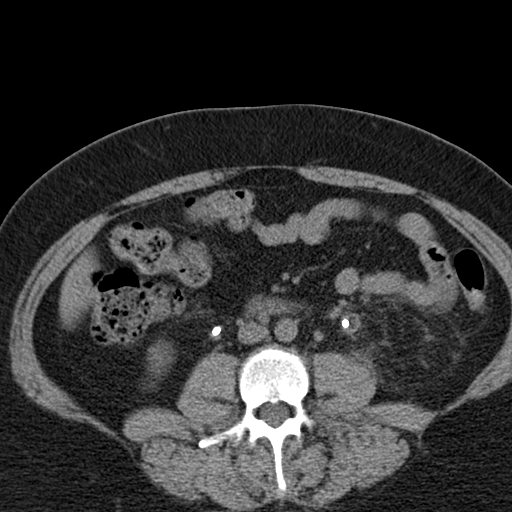
[im 59/91  bone]
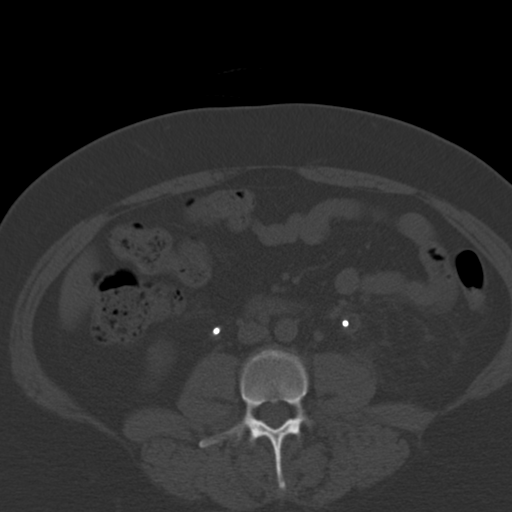
[im 64/91  soft-tissue]
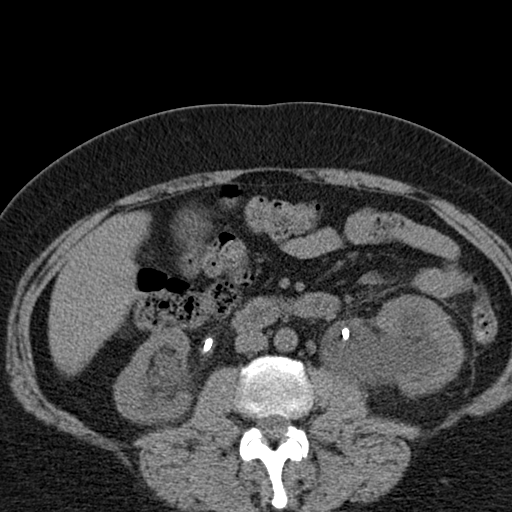
[im 73/91  soft-tissue]
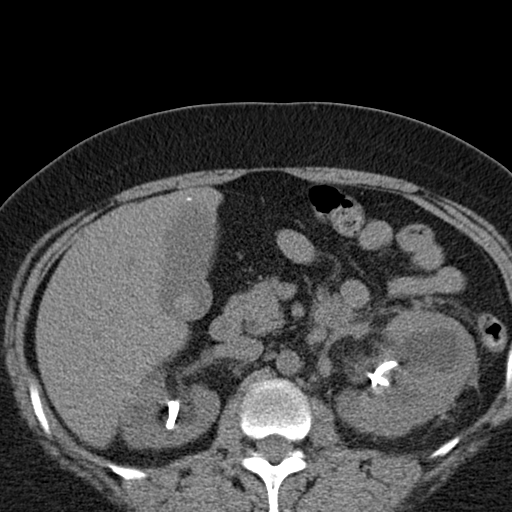
[im 79/91  soft-tissue]
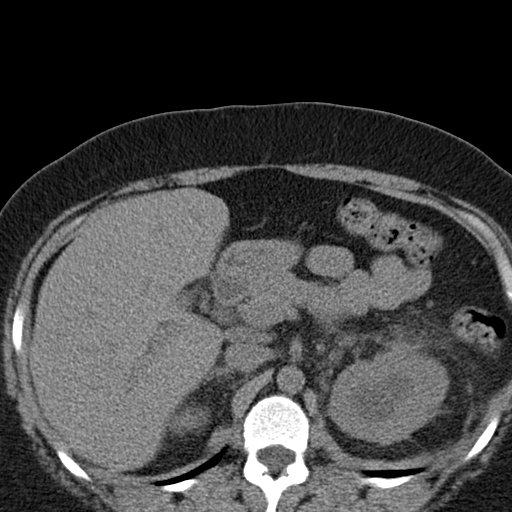
[im 85/91  soft-tissue]
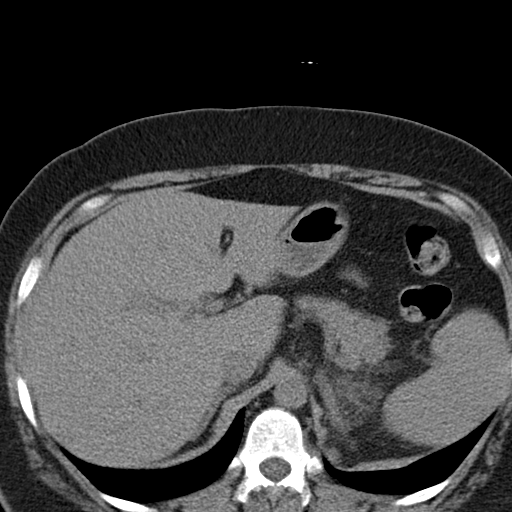

[Series 602: coronal · coronal · 0.75mm/px · 3 of 95 slices shown]
[im 32/95  soft-tissue]
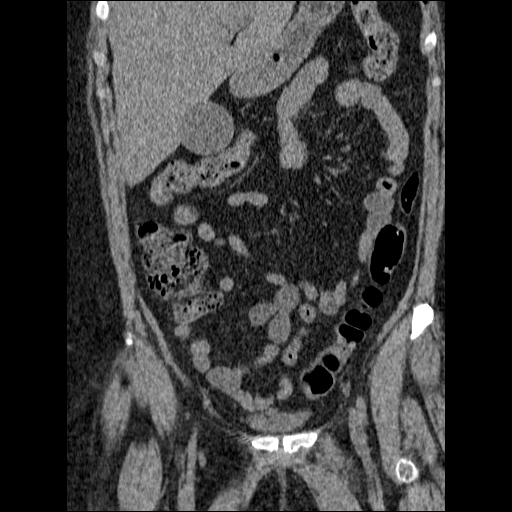
[im 42/95  soft-tissue]
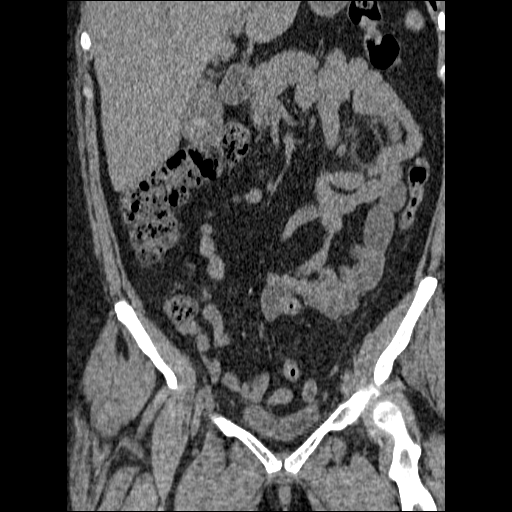
[im 53/95  soft-tissue]
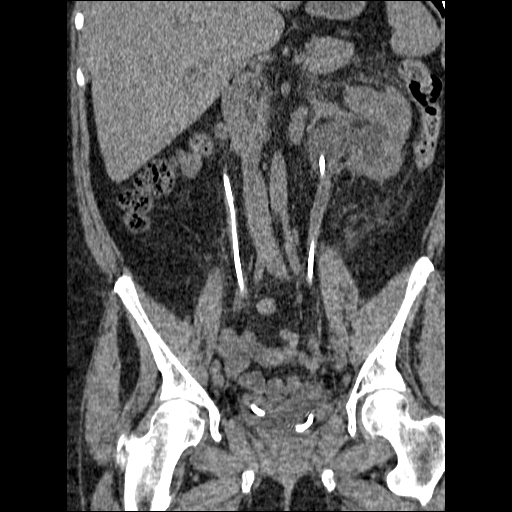

[16 of 46 positions shown; findings below may reference images not displayed]

FINDINGS: Small amount of lung parenchyma is imaged and is clear.  No pleural effusion.  The patient has severe left hydroureteronephrosis with double-J stent in place.  Stent appears to be in good position with the proximal loop in the renal pelvis and distal loop in the urinary bladder.  No stone is identified.  There is perirenal and periureteral stranding.  The patient also has a right double-J stent catheter in place with mild fullness in the right renal collecting system and periureteral stranding, but no frank hydronephrosis.  No right urinary tract stones.  Kidneys are otherwise unremarkable.  
The uninfused appearance of the spleen, pancreas, adrenal glands, and imaged liver is normal.  Stones are again noted within the gallbladder, but there is no CT evidence of acute cholecystitis, and the biliary tree is unremarkable.  The patient has new retroperitoneal lymphadenopathy including a left paracaval lymph node on image #22, measuring 1.3 x 1.2 cm in the axial plane by 2.2 cm craniocaudal.  New lymph node just superior to the left renal hilum posterior to the left renal vein measures 0.9 cm transverse x 1.3 cm AP on image #15.  Several smaller lymph nodes are identified in the retroperitoneum.  Stomach and small bowel appear normal.  There is no focal fluid collection.  No focal bony abnormality.
IMPRESSION: 1.  Severe left hydroureteronephrosis with a double-J stent in place in good position.  Cause for obstruction is not visualized.
2.  New retroperitoneal lymphadenopathy.  This could be reactive in nature, but metastatic disease is also possible. 
3.  Gallstones. 
PELVIS CT WITHOUT CONTRAST:
FINDINGS: Distal ends of double-J stents are again noted.  Postoperative/treatment changes noted in the cervix.  The urinary bladder is decompressed, but otherwise unremarkable.  Some small pelvic sidewall lymph nodes are noted, but no pathological lymphadenopathy by CT size criteria.  Simple-appearing lipoma is noted within the right gluteus medius muscle.  The colon and appendix are normal in appearance.  No focal fluid collection.  Osteochondroma off the anterior aspect of the left femur is noted.
IMPRESSION: No acute finding in the pelvis.

## 2006-08-31 ENCOUNTER — Ambulatory Visit: Payer: Self-pay | Admitting: Internal Medicine

## 2006-09-02 ENCOUNTER — Encounter (INDEPENDENT_AMBULATORY_CARE_PROVIDER_SITE_OTHER): Payer: Self-pay | Admitting: Interventional Radiology

## 2006-09-02 IMAGING — CT CT BIOPSY
4 of 10 series · 11 of 32 positions shown, 16 images · non-contrast
Comparison: none

CLINICAL DATA: History of cervical carcinoma.  Mildly enlarged left periaortic retroperitoneal lymph node.  Patient presents for biopsy of this node.
 CT GUIDED NEEDLE ASPIRATE AND CORE BIOPSY OF LEFT PERIAORTIC RETROPERITONEAL LYMPH NODE ? [DATE]: 
 Prior to the procedure informed consent was obtained.   
 Sedation:  2 mg IV Versed, 100 mcg IV Versed.  
 Total Moderate Sedation Time:   30 minutes.
 Procedure:  Preliminary unenhanced CT was performed through the mid abdomen with the patient in a prone position.  The left paraspinous region was then marked and prepped and draped.  Local anesthesia was provided with 1% lidocaine.   A 22 gauge spinal needle was introduced to act as a guide for trocar needle placement as well as a method of administrating deep anesthetic.  A 17 gauge trocar needle was then advanced to the level of the left periaortic lymph node.  Initial core biopsy was performed with an automated 18 gauge device.  Three core samples were obtained and placed in formalin.  In order to maximize yield of biopsy, needle aspirates were also obtained for cytology.  Three aspirates were placed on slides utilizing 22 gauge HASAN ENES.  HASAN ENES fourth needle aspirate was placed in solution.
 Complications:  None.

[Series 2: abd_pel 5.0 b40f st · axial · 0.59mm/px · z∈[+2,+62]mm · 5 of 18 slices shown, 10 images (1 of 4)]
[im 3/18  soft-tissue]
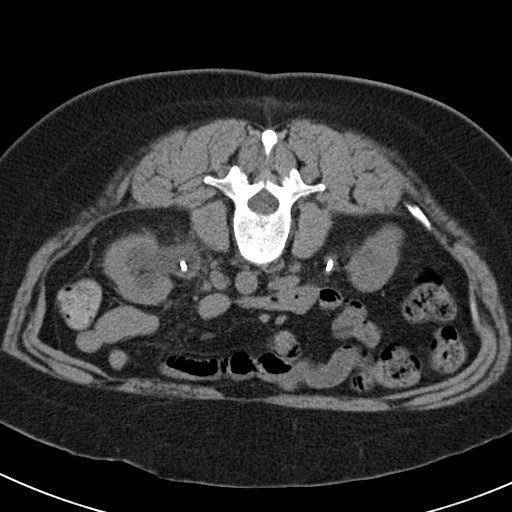
[im 3/18  bone]
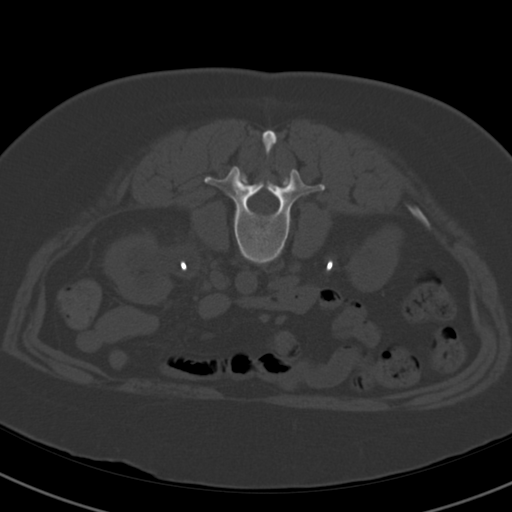
[im 6/18  soft-tissue]
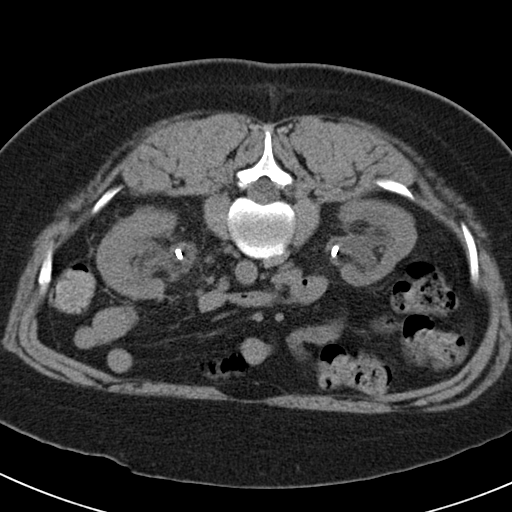
[im 6/18  lung]
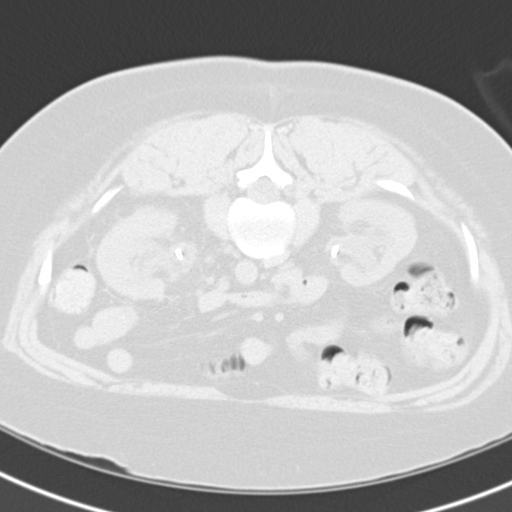
[im 9/18  soft-tissue]
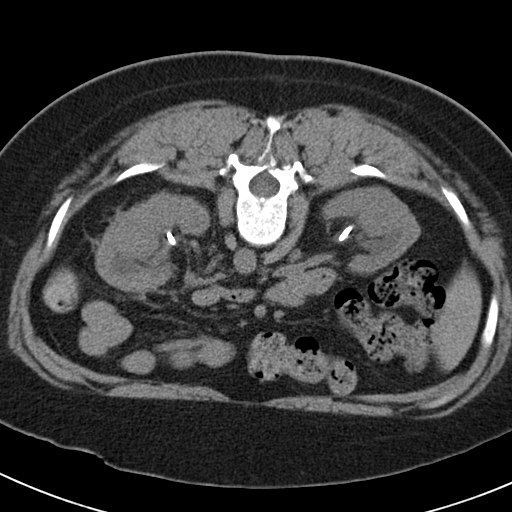
[im 9/18  lung]
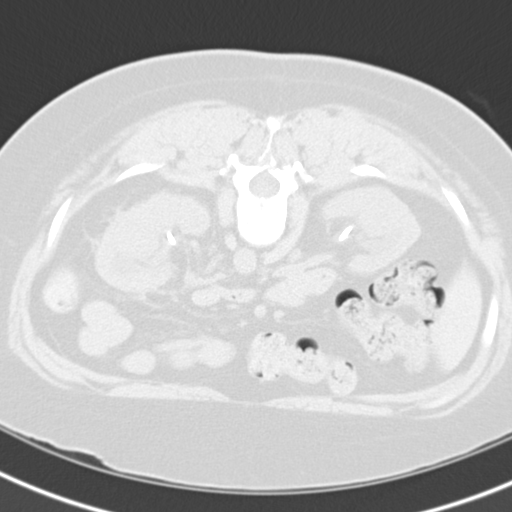
[im 12/18  soft-tissue]
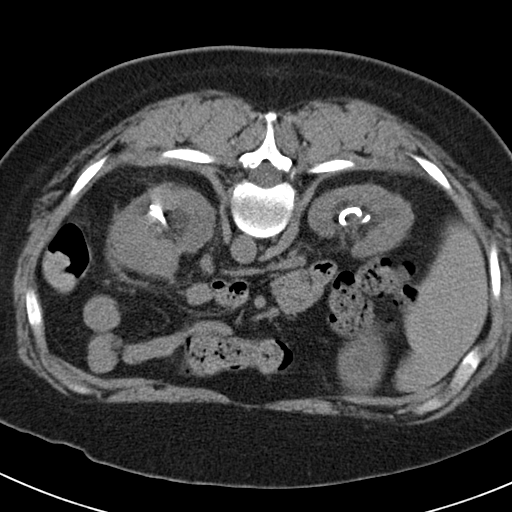
[im 12/18  lung]
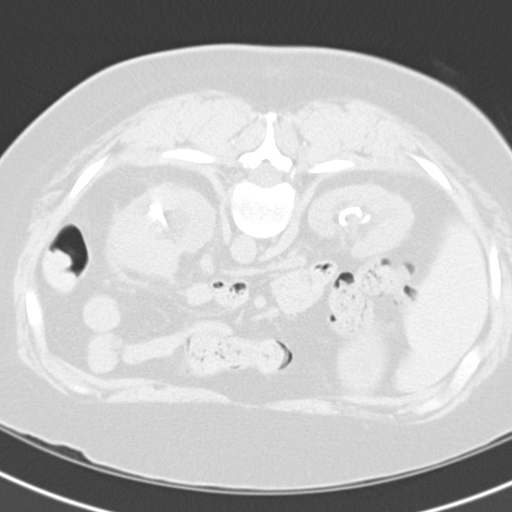
[im 15/18  soft-tissue]
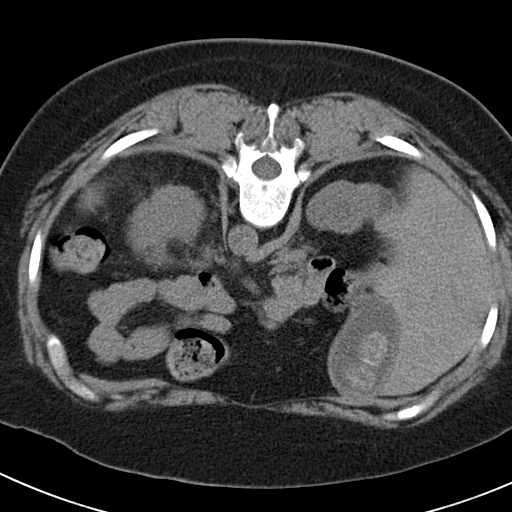
[im 15/18  lung]
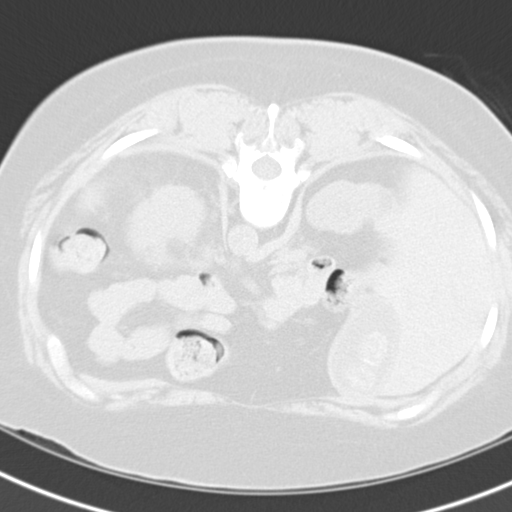

[Series 7: abd_pel 5.0 b40f st · axial · 0.47mm/px · z∈[+0,+10]mm · 2 of 8 slices shown (2 of 4)]
[im 3/8  soft-tissue]
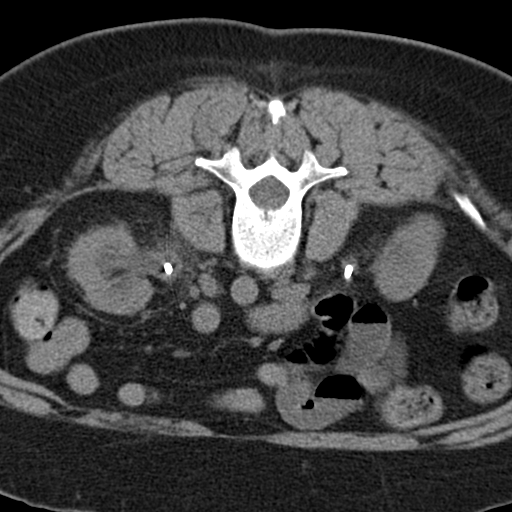
[im 5/8  soft-tissue]
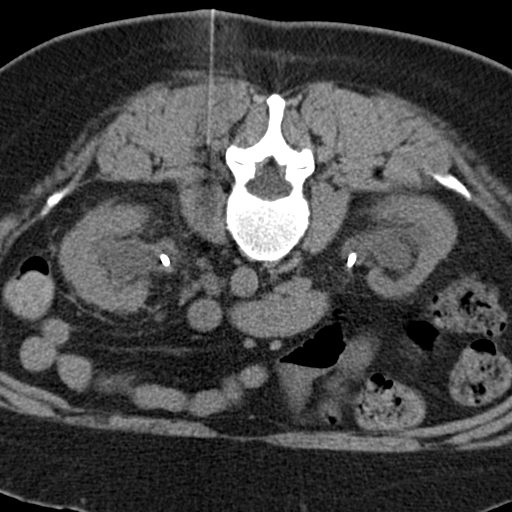

[Series 8: abd_pel 5.0 b40f st · axial · 0.47mm/px · z∈[+0,+10]mm · 2 of 8 slices shown (3 of 4)]
[im 3/8  soft-tissue]
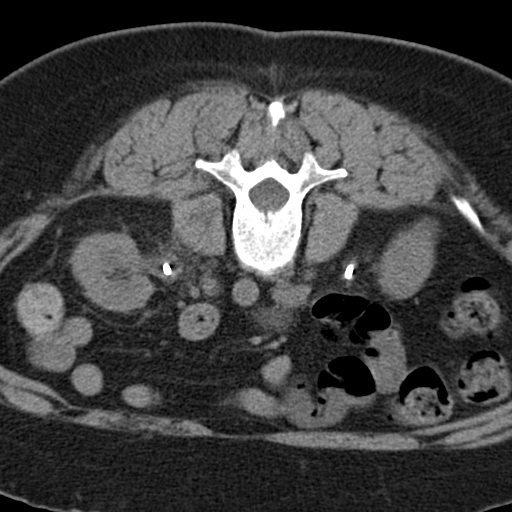
[im 5/8  soft-tissue]
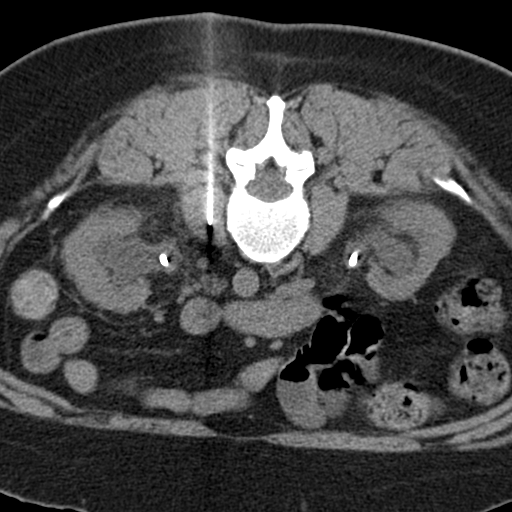

[Series 9: abd_pel 5.0 b40f st · axial · 0.47mm/px · z∈[+0,+10]mm · 2 of 8 slices shown (4 of 4)]
[im 3/8  soft-tissue]
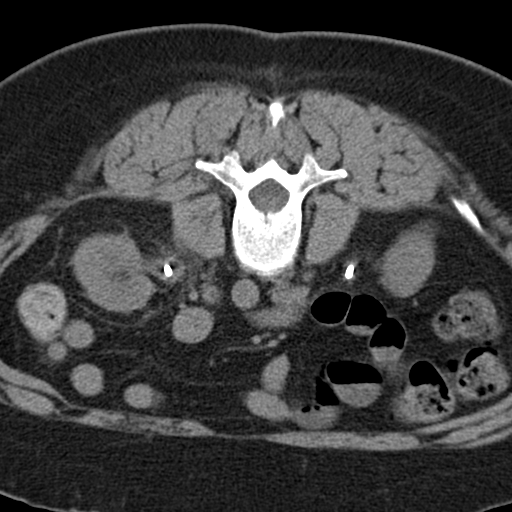
[im 5/8  soft-tissue]
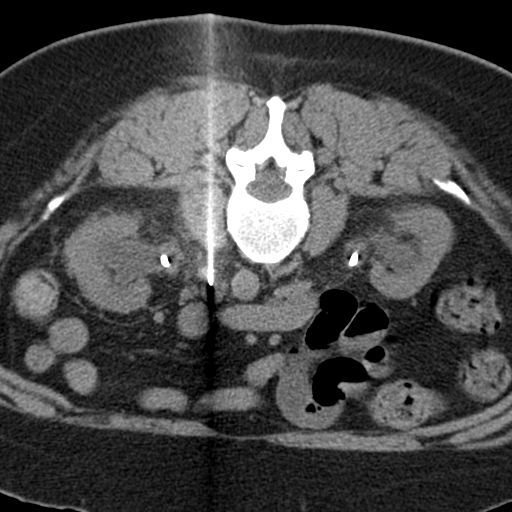

[11 of 32 positions shown; findings below may reference images not displayed]

FINDINGS: Biopsy was performed at the level of the single prominent left periaortic node.  There were no immediate bleeding complications. The patient is currently hospitalized and will be placed on bedrest for three hours after the biopsy procedure.
IMPRESSION: CT-guided needle aspirate and core biopsy of left periaortic lymph node.  18 gauge core samples and 22 gauge needle aspirate samples were obtained.

## 2006-12-20 ENCOUNTER — Ambulatory Visit (HOSPITAL_BASED_OUTPATIENT_CLINIC_OR_DEPARTMENT_OTHER): Admission: RE | Admit: 2006-12-20 | Discharge: 2006-12-20 | Payer: Self-pay | Admitting: Urology

## 2007-01-24 ENCOUNTER — Ambulatory Visit (HOSPITAL_COMMUNITY): Admission: RE | Admit: 2007-01-24 | Discharge: 2007-01-24 | Payer: Self-pay | Admitting: Urology

## 2007-02-15 ENCOUNTER — Ambulatory Visit: Payer: Self-pay | Admitting: Internal Medicine

## 2007-04-28 ENCOUNTER — Ambulatory Visit (HOSPITAL_BASED_OUTPATIENT_CLINIC_OR_DEPARTMENT_OTHER): Admission: RE | Admit: 2007-04-28 | Discharge: 2007-04-28 | Payer: Self-pay | Admitting: Urology

## 2007-08-22 ENCOUNTER — Ambulatory Visit (HOSPITAL_BASED_OUTPATIENT_CLINIC_OR_DEPARTMENT_OTHER): Admission: RE | Admit: 2007-08-22 | Discharge: 2007-08-22 | Payer: Self-pay | Admitting: Urology

## 2008-04-23 ENCOUNTER — Ambulatory Visit (HOSPITAL_BASED_OUTPATIENT_CLINIC_OR_DEPARTMENT_OTHER): Admission: RE | Admit: 2008-04-23 | Discharge: 2008-04-23 | Payer: Self-pay | Admitting: Urology

## 2010-04-06 ENCOUNTER — Encounter: Payer: Self-pay | Admitting: Internal Medicine

## 2010-07-01 LAB — CBC
HCT: 38 % (ref 36.0–46.0)
Hemoglobin: 12.7 g/dL (ref 12.0–15.0)
MCHC: 33.4 g/dL (ref 30.0–36.0)
MCV: 91.9 fL (ref 78.0–100.0)
RBC: 4.14 MIL/uL (ref 3.87–5.11)
RDW: 13.9 % (ref 11.5–15.5)
WBC: 9.2 10*3/uL (ref 4.0–10.5)

## 2010-07-01 LAB — COMPREHENSIVE METABOLIC PANEL
ALT: 13 U/L (ref 0–35)
AST: 18 U/L (ref 0–37)
Albumin: 3.6 g/dL (ref 3.5–5.2)
Alkaline Phosphatase: 128 U/L — ABNORMAL HIGH (ref 39–117)
BUN: 42 mg/dL — ABNORMAL HIGH (ref 6–23)
CO2: 19 mEq/L (ref 19–32)
Calcium: 9 mg/dL (ref 8.4–10.5)
Chloride: 111 mEq/L (ref 96–112)
Creatinine, Ser: 3.71 mg/dL — ABNORMAL HIGH (ref 0.4–1.2)
GFR calc Af Amer: 15 mL/min — ABNORMAL LOW (ref >60)
GFR calc non Af Amer: 13 mL/min — ABNORMAL LOW (ref >60)
Glucose, Bld: 121 mg/dL — ABNORMAL HIGH (ref 70–99)
Potassium: 3.9 mEq/L (ref 3.5–5.1)
Sodium: 140 mEq/L (ref 135–145)
Total Bilirubin: 0.6 mg/dL (ref 0.3–1.2)
Total Protein: 7.2 g/dL (ref 6.0–8.3)

## 2010-07-29 NOTE — Op Note (Signed)
Mary Wilcox NO.:  [id number]   MEDICAL RECORD NO.:  [id number]         PATIENT TYPE:  LAMB   LOCATION:                                FACILITY:  [facility]   PHYSICIAN:  Bernestine Amass, M.D.  DATE OF BIRTH:  1956-02-22   DATE OF PROCEDURE:  01/24/2007  DATE OF DISCHARGE:                               OPERATIVE REPORT   PREOPERATIVE DIAGNOSES:  1. Severe left hydronephrosis.  2. Chronic renal failure.  3. History of cervical cancer.   POSTOPERATIVE DIAGNOSIS:  1. Severe left hydronephrosis.  2. Chronic renal failure.  3. History of cervical cancer.   PROCEDURE PERFORMED:  Cystoscopy, left retrograde pyelogram and left  double-J stent placement.   SURGEON:  Bernestine Amass, M.D.   ANESTHESIA:  General.   INDICATIONS:  Ms. Mary Wilcox has a somewhat complicated history.  She is 55  years of age.  She had originally presented with relatively asymptomatic  severe bilateral hydronephrosis with severe renal insufficiency.  At  that time we noted that she had a markedly abnormal cervix and  eventually we ended up placing double-J stents and she had treatment for  what turned out to be squamous cell carcinoma of the cervix and had  chemotherapy and radiation.  Renal function returned to normal but she  never came back in for any follow-up with regard to her stents.  She  does got readmitted to the hospital with recurrent renal insufficiency  and a febrile urinary tract infection.  One of my partners ended up  exchanging her double-J stents and her creatinine improved to about 3.5  but never got down to her fairly normal baseline levels.  The patient  had had some retroperitoneal adenopathy but a CT guided biopsy was  negative.  The patient elected not to follow-up with her oncologist and  has not been keeping up with Nephrology either.  About a month ago, we  reassessed her.  Retrograde pyelogram on the left side did not look bad  at that time.  We ended up  exchanging her right double-J stent but  taking out her left stent.  Follow-up showed recurrent severe  hydronephrosis with worsening of her creatinine up to approximately 6.  We recommended repeat insertion of left double-J stent along with  subsequent follow-up with Dr. Justin Mend and also additional oncologic follow  with Dr. Julien Nordmann.  She presents now for this.  Of note, her hemoglobin  preoperatively is 8.9, undoubtedly due to her chronic renal failure and  her creatinine today was 6.1.   TECHNIQUE AND FINDINGS:  The patient was brought to the operating room  where she had successful induction of general anesthesia.  She was  placed in lithotomy position, prepped and draped in the usual manner.  Cystoscopy showed the right double-J stent to be in good position.   Left retrograde pyelogram was done with fluoroscopic guidance utilizing  the open-ended catheter.  The entire distal ureter appeared to show  evidence of some extrinsic compression.  At the junction of the distal  and mid ureter, there was a fairly significant transition  point, at  which point the ureter and whole collecting system became markedly  dilated.  A guidewire was placed up to the left renal pelvis.   Stent was then placed.  We chose a 7-French 24 cm stent which was  positioned with fluoroscopic as well as visual guidance without  difficulty.  The patient appeared to tolerate the procedure well and  there were no obvious complications or problems.           ______________________________  Bernestine Amass, M.D.  Electronically Signed     DSG/MEDQ  D:  01/24/2007  T:  01/24/2007  Job:  [id number]   cc:   Sherril Croon, M.D.  Fax: [phone]   Eilleen Kempf, MD  Fax: [phone]

## 2010-07-29 NOTE — Op Note (Signed)
NAMEKENNETHA, LUDWIG NO.:  [id number]   MEDICAL RECORD NO.:  [id number]          PATIENT TYPE:  AMB   LOCATION:  [facility]                         FACILITY:  [facility]   PHYSICIAN:  Bernestine Amass, M.D.  DATE OF BIRTH:  [birth year]   DATE OF PROCEDURE:  12/20/2006  DATE OF DISCHARGE:  12/20/2006                               OPERATIVE REPORT   PREOPERATIVE DIAGNOSES:  1. Bilateral hydronephrosis with retained double-J stents.  2. History of cervical cancer.   POSTOPERATIVE DIAGNOSES:  1. Bilateral hydronephrosis with retained double-J stents.  2. History of cervical cancer.   PROCEDURE PERFORMED:  Cystoscopy with bilateral retrograde pyelography  and removal of left double-J stent and replacement of right double-J  stent.   SURGEON:  Bernestine Amass, M.D.   ANESTHESIA:  General.   INDICATIONS:  [name] is a 55 year old female.  She has a somewhat  complex history.  She was originally seen about 2-1/2 years ago with at  that time asymptomatic unexplained severe bilateral hydronephrosis with  marked renal insufficiency.  She had extrinsic compression of both of  her distal ureters and a markedly hard, indurated cervix.  She was  diagnosis with squamous cell carcinoma of the cervix and eventually had  chemotherapy and radiation therapy.  Renal function subsequently  improved and she had a stent exchange.  She never came back for any  follow-up and subsequently presented to the hospital with acute renal  failure and a febrile urinary tract infection.  The patient understood  that she had failed to follow-up and to keep her part of the doctor-  patient relationship.  Eventually one of my partners ended up taking out  her double-J stents and replacing those.  She was also noted to have  retroperitoneal adenopathy and during hospitalization had a CT guided  biopsy which was negative.  The patient's creatinine improved but never  returned to normal and has been in  the 3 to 4 range.  When we saw her we  felt was appropriate to reassess her.  The patient had some repeat of  blood studies done in August which showed her creatinine to be 3.5.  We  felt that she needed repeat assessment and determination of whether she  still need for double-J stent.  She presents now for that.   TECHNIQUE AND FINDINGS:  The patient was brought to the operating room  where she had successful induction of general anesthesia.  She was  placed in lithotomy position and prepped and draped in the usual manner.  Cystoscopy revealed two double-J stents in good position.  These were  both removed.   Attention was then turned towards retrograde pyelography.  This was done  with an 8-French cone-tip catheter on both sides.  We found that both  ureters were of relatively normal caliber without obvious significant  extrinsic compression.  There was slight dilation of both collecting  systems consistent with chronic double-J stent placement but there did  not appear to be any significant obstruction and both sides drained  reasonably well.  I felt it would probably be  prudent not to remove both  double-Js at the same time, but rather to place another double-J in one  of the kidneys and to see how she did.  For that reason I chose the  right kidney and a new 6-French 24 cm stent was placed  with fluoroscopic as well as direct visual guidance.  We elected to  leave the stent out of the left kidney and to monitor her closely with  serial ultrasounds, clinical assessment and renal function studies.  The  patient appeared to tolerate the procedure well.  There were no obvious  complications or difficulties.           ______________________________  Bernestine Amass, M.D.  Electronically Signed     DSG/MEDQ  D:  12/29/2006  T:  12/30/2006  Job:  [id number]

## 2010-07-29 NOTE — Consult Note (Signed)
NAMEKYLEA, CHAPMAN NO.:  [id number]   MEDICAL RECORD NO.:  [id number]          PATIENT TYPE:  INP   LOCATION:  0107                         FACILITY:  [facility]   PHYSICIAN:  Raynelle Bring, MD      DATE OF BIRTH:  10-21-1955   DATE OF CONSULTATION:  08/29/2006  DATE OF DISCHARGE:                                 CONSULTATION   REASON FOR CONSULTATION:  1. Acute renal failure.  2. Ureteral obstruction.  3. Febrile urinary tract infection.   REQUESTING PHYSICIAN:  Dr. Davonna Belling.   HISTORY:  Mary Wilcox is a 55 year old female with a history of cervical  cancer who also had a history of bilateral ureteral obstruction  secondary to her cervical cancer.  She underwent ureteral stent  placement in 2006 by Dr. Risa Grill with her last stents being placed in the  fall of 2006.  The patient received radiation and chemotherapy for  treatment of her cervical cancer and has been told that she has no  evidence of disease recurrence since her treatment.  She presented today  with a 2-day history of left-sided flank pain with some radiation to her  left upper quadrant.  She also has had fever to 101.7 degrees  Fahrenheit.  The patient also has nausea and vomiting and has not been  able to eat over the past 24 hours.  On evaluation the emergency  department, the patient was found to be in acute renal failure with a  creatinine of 5.5.  A CT scan was performed without contrast which  demonstrated left-sided hydronephrosis.  Her stents were noted to be in  proper position.   PAST MEDICAL HISTORY:  Cervical cancer.   PAST SURGICAL HISTORY:  None.   MEDICATIONS:  None.   ALLERGIES:  PROZAC and a questionable allergy to an ANESTHETIC AGENT.   FAMILY HISTORY:  The patient's father died of lung cancer.   SOCIAL HISTORY:  No tobacco or alcohol.   REVIEW OF SYSTEMS:  A complete review of systems was performed.  Pertinent positives include recent fever as well as nausea and  vomiting  and anorexia.   PHYSICAL EXAM:  VITAL SIGNS:  Temperature 101.7, heart rate 105,  respirations 16, blood pressure 158/99.  CONSTITUTIONAL:  Alert and oriented in no acute distress.  HEENT:  Normocephalic, atraumatic.  NECK:  Supple without lymphadenopathy.  CARDIOVASCULAR:  Regular rate and rhythm without obvious murmurs.  LUNGS:  Clear bilaterally.  ABDOMEN:  Soft, nondistended with minimal left upper quadrant  tenderness.  BACK:  Moderate left CVA tenderness.  EXTREMITIES:  No edema.  PSYCHIATRIC:  Normal mood and affect.  LYMPH NODE SURVEY:  No cervical, supraclavicular, or inguinal  lymphadenopathy.   LABS:  Urinalysis with pH 6.5, with 7-10 white blood cells, 3-6 red  blood cells, and many bacteria.   White blood count 16.9, hemoglobin 10.   Basic metabolic panel demonstrate a potassium of 5.2, serum creatinine  is 5.5.   IMAGING:  The patient's CT scan was independently reviewed.  This  demonstrates bilateral ureteral stents in the appropriate position.  There is,  however, significant left-sided hydronephrosis.  There does  not appear to be extensive calcification along the stents bilaterally.   IMPRESSION:  1. Acute renal failure.  2. Ureteral obstruction.  3. Febrile urinary tract infection.   PLAN:  The patient has a urine culture that has been sent.  She has been  empirically started on ciprofloxacin.  She will be taken to the  operating room for an attempted bilateral ureteral stent change.  Due to  the lengthy time that her stents have been in place, she was counseled  that this could prove to be difficult and if her stents are unable to be  removed, that she may require bilateral nephrostomy drainage.  Risks,  complications and alternatives to the proposed procedure were discussed  with the patient and informed consent was obtained.   The patient will then be admitted to the hospital for careful monitoring  of a possible postobstructive diuresis  and to monitor her renal  function.           ______________________________  Raynelle Bring, MD  Electronically Signed     LB/MEDQ  D:  08/29/2006  T:  08/30/2006  Job:  [id number]   cc:   Mackie Pai, MD

## 2010-07-29 NOTE — Discharge Summary (Signed)
NAMETIAJAH, LABEAN NO.:  [id number]   MEDICAL RECORD NO.:  [id number]          PATIENT TYPE:  INP   LOCATION:  [id number]                         FACILITY:  [facility] [place]   PHYSICIAN:  Raynelle Bring, MD      DATE OF BIRTH:  Jul 22, 1955   DATE OF ADMISSION:  08/29/2006  DATE OF DISCHARGE:                               DISCHARGE SUMMARY   ADMISSION DIAGNOSES:  1. Ureteral obstruction.  2. Acute renal failure.  3. Possible recurrent cervical cancer.   DISCHARGE DIAGNOSES:  1. Ureteral obstruction.  2. Acute renal failure.  3. Possible recurrent cervical cancer.   HISTORY AND PHYSICAL:  For full details, please see admission history  and physical.  Briefly, Mary Wilcox is a 55 year old female with a  history of cervical cancer and bilateral ureteral obstruction secondary  to her cervical cancer.  She underwent therapy with chemoradiation and  her ureteral obstruction was managed with ureteral stent placement.  She  has not had further followup since 2006 and presented to the emergency  department with flank pain.  She was found to have bilateral  hydronephrosis, worse on the left, with acute renal failure.   HOSPITAL COURSE:  The patient was taken to the operating room on August 29, 2006, and underwent a bilateral ureteral stent change which was  uncomplicated.  She was then admitted to the hospital and monitored for  postobstructive diuresis.  Her electrolytes and fluid status appeared  stable over the first 2 days of admission and her renal function  improved but then stabilized at 4.4.  A nephrology consultation was  obtained as it was felt she likely had an element of chronic kidney  disease as well as acute renal failure.  During her hospitalization, the  patient did have a difficult time with blood draws, but prior to  discharge her creatinine continued to improve at 4.1.  She was felt to  be stable for discharge from a nephrology standpoint with further care  directed as an outpatient.  She also was noted to have some  retroperitoneal lymphadenopathy on her CT scan noted on admission.  This  was concerning for possible recurrent cervical cancer.  The patient  therefore underwent a CT-guided biopsy at the request of Dr. Earlie Server,  who was consulted during her hospitalization.  This was performed  without complication.  The pathology results are currently pending at  the time of this dictation.  On September 03, 2006, the patient appeared to  be stable for discharge.   DISPOSITION:  Home.   DISCHARGE MEDICATIONS:  The patient will not be placed on any new  medications.   FOLLOWUP:  The patient was instructed to call [place]  and the [facility] for followup within the next 1-2 weeks  for further treatment of her oncologic and nephrology issues.  I also  personally put in a call to both the cancer center and [place] to notify them about Ms. Mary Wilcox  need for an appointment.  Mary Wilcox has previously been taken care of  by Dr. Risa Grill for  her urologic needs.  While I have been caring for her  due to Dr. Cy Blamer absence this week, I will plan to schedule the  patient to follow up with Dr. Risa Grill in 2 months for further assessment  of her urologic needs and to consider her next stent change.           ______________________________  Raynelle Bring, MD  Electronically Signed     LB/MEDQ  D:  09/03/2006  T:  09/03/2006  Job:  [id number]

## 2010-07-29 NOTE — Op Note (Signed)
Mary Wilcox, Mary Wilcox NO.:  [id number]   MEDICAL RECORD NO.:  [id number]          PATIENT TYPE:  INP   LOCATION:  0107                         FACILITY:  [facility]   PHYSICIAN:  Raynelle Bring, MD      DATE OF BIRTH:  1955/10/20   DATE OF PROCEDURE:  08/29/2006  DATE OF DISCHARGE:                               OPERATIVE REPORT   PREOPERATIVE DIAGNOSES:  1. Acute renal failure.  2. Ureteral obstruction.  3. Febrile urinary tract infection.   POSTOPERATIVE DIAGNOSES:  1. Acute renal failure.  2. Ureteral obstruction.  3. Febrile urinary tract infection.   PROCEDURES:  1. Cystoscopy.  2. Left retrograde pyelography.  3. Bilateral ureteral stent change (6 x 24).   SURGEON:  Raynelle Bring, MD   ANESTHESIA:  LMA.   COMPLICATIONS:  None.   INDICATION:  Ms. Mary Wilcox is a 55 year old female with a history of  cervical cancer and a history of bilateral ureteral obstruction due to  her cervical cancer.  By report, her last followup following treatment  of her cervical cancer revealed no evidence of disease recurrence and  she informed me that she believed she is cancer-free at this time.  She  did have a history of ureteral obstruction managed by ureteral stents  last placed in the fall of 2006.  She presented today in acute renal  failure and with evidence of hydronephrosis on a CT scan and also with  fever.  After discussing options with the patient, it was decided to  proceed with ureteral stent change.  Potential risks, complications and  alternatives were discussed with the patient as well as the likely need  for possible nephrostomy tube placement.  Informed consent was obtained.   DESCRIPTION OF PROCEDURE:  The patient was taken to the operating room  and a general anesthetic was administered.  The patient was given  preoperative antibiotics in the emergency department, placed in the  dorsal lithotomy position and prepped and draped in the usual sterile  fashion.  Next a preoperative timeout was performed.  Cystourethroscopy  was then performed which demonstrated indwelling bilateral ureteral  stents along with debris within the bladder.  No obvious bladder tumors  or other mucosal pathology was identified.  The left ureteral stent was  then grasped with the flexible graspers and brought out through the  ureteral meatus.  A 0.38 Sensor guidewire was able to be advanced up the  stent and into the left renal pelvis with relative ease.  The stent was  removed without difficulty.  To confirm placement, a 6-French ureteral  catheter was advanced over the wire and contrast was injected, which  demonstrated the stent to be within the left renal pelvis.  The left  renal pelvis was noted to be extremely hydronephrotic without evidence  of filling defects.  Care was taken to keep the intrarenal pressures low  due to the patient's likely febrile urinary tract infection.  A Sensor  guidewire was then replaced into the left renal pelvis; this wire was  backloaded onto the cystoscopy and a 6 x 24 double-J ureteral stent  was  placed using Seldinger technique.  It was appropriately positioned under  fluoroscopic and cystoscopic guidance  and the wire was removed with a  good curl noted in the renal pelvis as well as in the bladder.  Attention then turned to the left ureteral stent, which was also able to  be brought out through the urethral meatus with ease.  Again, a 0.38  Sensor guidewire was advanced through the stent up into the left renal  pelvis under fluoroscopic guidance.  The stent was removed without  difficulty and the wire was again backloaded onto the cystoscope.  A 6 x  24 double-J ureteral stent was then advanced over the wire and again  positioned under fluoroscopic and cystoscopic guidance.  The wire was  removed again with a good curl noted in the renal pelvis as well as in  the bladder.  A 16-French Foley catheter was placed into the  bladder.  The patient tolerated the procedure well and without complication.  She  was able to be awakened and transferred to the recovery unit in  satisfactory condition.           ______________________________  Raynelle Bring, MD  Electronically Signed     LB/MEDQ  D:  08/29/2006  T:  08/30/2006  Job:  [id number]

## 2010-07-29 NOTE — Consult Note (Signed)
NAME:  Mary Wilcox, Mary Wilcox NO.:  [id number]   MEDICAL RECORD NO.:  [id number]          PATIENT TYPE:  INP   LOCATION:  1431                         [facility]ACILITY:  [facility]   PHYSICIAN:  Sherril Croon, M.D.   DATE OF BIRTH:  1956-02-10   DATE OF CONSULTATION:  09/01/2006  DATE OF DISCHARGE:                                 CONSULTATION   This is a 55 year old white female with stage IIIA cervical cancer  diagnosed in May of 2008. She also at the same time had developed  bilateral obstructive uropathy status post double J stents placed by Dr.  Risa Grill March 2006. Unfortunately, she was lost to followup after  receiving combination chemotherapy and radiation treatment to cervix.  Serum creatinine in July 2006 was last measured at 1.2. She denies any  regular medications. She denies any over-the-counter nonsteroidal anti-  inflammatory drugs. She states that over the past week she has been  complaining of left sided flank pain, nausea and vomiting over the last  3 to 4 days, decreased p.o. intake. Presented to the emergency room with  a creatinine of 6.9, and a CT demonstrated left sided hydronephrosis.  She developed tremors, rigors, fevers up to 101.5. She was taken to the  operating room for stent exchange by Dr. Alinda Money August 29, 2006. She was  also found on CT scan to have retroperitoneal lymphadenopathy.   PAST MEDICAL HISTORY:  1. History of cervical cancer, stage IIIA.  2. History of bilateral obstructive uropathy.   CURRENT MEDICATIONS:  Cipro 500 mg daily.   ALLERGIES:  PROZAC, NONSTEROIDAL ANTI-INFLAMMATORY DRUGS.   REVIEW OF SYSTEMS:  Fever on admission. No sweats. History of chills. No  weight loss. No growth on current urine culture. EYES:  No visual loss,  no diplopia, no eye pain, no redness. EARS, NOSE, MOUTH, THROAT:  No  hearing loss, no epistaxis, no sore throat, no dental complaints.  CARDIOVASCULAR:  No chest pain, shortness of breath, syncope or  palpitations. RESPIRATORY:  Denies cough, wheeze, hemoptysis. ABDOMINAL  SYSTEM:  Flank pain. Not improved. No change in bowel habits. Does have  a history of nausea and vomiting over the past 3 to 4 days. UROGENITAL:  Frequency of urine over the past week, foaminess of urine over the past  week. MUSCULOSKELETAL:  No nonsteroidal anti-inflammatories. No back  pain. NEUROLOGICAL:  No strokes, seizures, numbness, tingling, pins or  needles, weakness. No diplopia, dysarthria, dysphagia. DERMATOLOGIC:  No  skin rashes or itching. ENDOCRINE:  No diabetes. No thyroid disease.   FAMILY HISTORY:  Father died of lung cancer.   SOCIAL HISTORY:  She is divorced. Two children in their 39s. No tobacco.  No alcohol. Works at __________ . Has a cat at home.   PHYSICAL EXAMINATION:  Alert, pale lady in no obvious distress. Blood  pressure 137/78, pulse 55, temperature 98.4.  HEAD AND EYES:  Normocephalic, atraumatic. Pupils are equal, round, and  reactive. Funduscopic evaluation deferred. Pupils are round, equal and  reactive. Extraocular movements are intact. EARS, NOSE, MOUTH AND  THROAT:  __________ was normal. Nasal mucosa clear. Oropharynx  was  clear.  NECK:  Was supple. JVP not elevated.  CARDIOVASCULAR:  Regular rate and rhythm. No murmurs, rubs, or gallops.  BREAST EVALUATION:  Deferred.  LUNGS:  Fields clear to auscultation. No wheezes or rales.  ABDOMEN:  Soft, nontender. Bowel sounds present. No organosplenomegaly  palpable.  UROGENITAL:  Foley catheter to straight drain.  EXTREMITIES:  No edema. Pulses bilateral, symmetric.   Most recent labs:  Sodium 138, potassium 4.3, chloride 107, CO2 22, BUN  44, creatinine 4.4, glucose 98, calcium 8.5. WBC 60.9, hemoglobin 10,  platelets 524. Urinalysis:  Greater than 300 of protein, moderate blood,  WBCs 7 to 10, RBCs 3 to 6, bacteruria.   ASSESSMENT AND PLAN:  1. Acute renal failure status post hydronephrosis, now slowly      recovering with  renal function. Will continue to follow.  2. History of urinary tract infection. Renally adjusted Cipro 500 mg      daily.  3. Anemia. Hemoglobin and hematocrit stable. Target hemoglobin of 10.      Bones.  Will check PTH and 25-hydroxyvitamin D level.  4. New lymphadenopathy. Will need biopsy and then salvage      chemotherapy.  5. Proteinuria. Possibly paraneoplastic syndrome. Will check      protein/creatinine ratio to quantitate.      Sherril Croon, M.D.  Electronically Signed     MWW/MEDQ  D:  09/01/2006  T:  09/01/2006  Job:  [id number]   cc:   Dutch Gray, M.D.  Fax: [phone]

## 2010-07-29 NOTE — Consult Note (Signed)
NAMECATHEY, BATTLE NO.:  [id number]   MEDICAL RECORD NO.:  [id number]          PATIENT TYPE:  INP   LOCATION:  [id number]                         [facility]ACILITY:  [facility]   PHYSICIAN:  Eilleen Kempf, MD  DATE OF BIRTH:  November 21, 1955   DATE OF CONSULTATION:  08/31/2006  DATE OF DISCHARGE:  09/03/2006                                 CONSULTATION   HISTORY:  We were asked to see this patient by Dr. Alinda Money.  [name]  is a 55 year old white female from [place], [place], admitted  with acute renal failure, ureteral obstruction and febrile UTI.  She has  a history of stage III cervical cancer diagnosed in March 2006, treated  with concurrent chemoradiation with chemotherapy in the form of  Cisplatin.  She is followed by Dr. Risa Grill for her bilateral  hydronephrosis and is status post J stent placement.  A CT abdomen of  the abdomen and pelvis on August 29, 2006, revealed new retroperitoneal  lymphadenopathy and we are being consulted regarding this new finding.   REVIEW OF SYSTEMS:  Prior to admission, patient had a 7-10 day history  of back pain that progressively worsened and was associated with nausea,  vomiting and fever.  Today she denied fever, chills, headache, blurred  division or double vision.  She has had no chest pain, no shortness of  breath, cough, hemoptysis, palpitations or syncope.  She denied  abdominal pain, diarrhea, constipation, no dysuria or hematuria.   PAST MEDICAL HISTORY:  1. Hypertension.  2. Renal insufficiency secondary to bilateral hydronephrosis.  3. History of stage III cervical cancer.  4. She denied any history of diabetes or coronary artery disease.   FAMILY HISTORY:  Significant for father with lung cancer diagnosed at  the age of 53.  Mother was diagnosed with thyroid and kidney cancer.   SOCIAL HISTORY:  She is divorced with two children, both in their late  39s.  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  She has a  remote  brief history of social cigarette smoking.  She denied any history of  alcohol or drug abuse.   ALLERGIES:  She is allergic to [facility].   HOME MEDICATIONS:  None.   PHYSICAL EXAMINATION:  VITAL SIGNS:  Temperature 98.2 with a pulse of  78, respirations 18, blood pressure 156/90, O2 saturation on room air  was 98%.  GENERAL APPEARANCE:  A very pleasant 55 year old white female who is  awake, alert and in no acute distress.  HEENT:  Normocephalic and atraumatic.  Sclerae are anicteric.  Pharynx  clear.  NECK:  Supple without lymphadenopathy.  CHEST:  Clear to auscultation without wheezing, rhonchi or rales.  CARDIOVASCULAR:  Examination reveals an occasional ectopic beat,  otherwise regular rate and rhythm without appreciable murmurs, rubs, or  gallops.  ABDOMEN:  Obese, soft, nontender and nondistended with no appreciable  masses.  EXTREMITIES:  Without edema.   IMAGING STUDIES:  1. CT of the abdomen and pelvis without contrast revealed one severe      left hydroureteronephrosis with a double J stent in place  in good      position.  2. New retroperitoneal lymphadenopathy which is questionably reactive      versus metastatic disease.  3. Gallstones.   ASSESSMENT/PLAN:  1. Acute renal failure.  2. Ureteral obstruction.  3. Febrile urinary tract infection.  Management will be continued per      Dr. Alinda Money.  4. New retroperitoneal lymphadenopathy which may be reactive versus      questionable metastatic disease from previous cervical cancer.   RECOMMENDATIONS:  A CT guided biopsy of the retroperitoneal lymph nodes  to obtain tissue diagnosis.   Thank you for allowing [place] to participate in [name] care.      Awilda Metro, PA.      Eilleen Kempf, MD  Electronically Signed    AJ/MEDQ  D:  09/05/2006  T:  09/06/2006  Job:  [id number]

## 2010-07-29 NOTE — Op Note (Signed)
NAME:  Mary Wilcox, WALLOCH NO.:  [id number]   MEDICAL RECORD NO.:  [id number]          PATIENT TYPE:  AMB   LOCATION:  NESC                         FACILITY:  [facility]   PHYSICIAN:  Bernestine Amass, M.D.  DATE OF BIRTH:  January 19, 1956   DATE OF PROCEDURE:  DATE OF DISCHARGE:                               OPERATIVE REPORT   PREOPERATIVE DIAGNOSES:  1. Bilateral chronic hydronephrosis.  2. Chronic renal failure.  3. History of metastatic cervical cancer.   POSTOPERATIVE DIAGNOSES:  1. Bilateral chronic hydronephrosis.  2. Chronic renal failure.  3. History of metastatic cervical cancer.   PROCEDURE PERFORMED:  Cystoscopy with bilateral double-J stent exchange.   SURGEON:  Bernestine Amass, M.D.   ANESTHESIA:  General.   INDICATIONS:  [name] is a 55 year old female.  She has had chronic  longstanding bilateral hydronephrosis secondary to a prior history of  cervical cancer.  She also has significant chronic renal insufficiency  with a creatinine that typically runs approximately 5.  The status of  her current cancer is somewhat unknown since the patient has refused to  go back for oncologic follow up.  She supposedly under the care of Dr.  Edrick Oh for chronic renal failure, but also remains somewhat in  denial with regard to her chronic renal insufficiency.  We had attempted  to remove stents in the past, but she developed recurrent hydronephrosis  and flank pain.  Whether this represents continued evidence of  retroperitoneal cancer or scarring from prior treatment is unclear,  again since she has not gone back for any follow-up.  She has agreed to  bilateral double-J stent exchange.  In the past, was noncompliant in  coming in for that procedure as well.  Her last stent exchange occurred  about 4 months ago, and she currently complains of no abdominal or flank  pain and has been doing well.  She is here now for routine stent  exchange.   TECHNIQUE AND  FINDINGS:  The patient was brought to the operating room  where she had successful induction of general anesthesia.  Cystoscopy  revealed both stents to be in good position.  Both stents were partially  removed with a grasper and guidewires were placed up both stents in the  renal pelvis.  Both stents were found to be patent without any evidence  of significant incrustation.  New 7-French 24 cm stents were placed  bilaterally with  fluoroscopic, as well as direct visual guidance and confirmed to be in  good position.  The patient had no obvious complications or procedures.  Again, preoperatively she was encouraged to continue with oncologic and  nephrology care, but she is unlikely to be compliant with those  recommendations.           ______________________________  Bernestine Amass, M.D.  Electronically Signed     DSG/MEDQ  D:  08/22/2007  T:  08/22/2007  Job:  [id number]   cc:   Eilleen Kempf, MD  Fax: [phone]   Sherril Croon, M.D.  Fax: [phone]

## 2010-08-01 NOTE — Op Note (Signed)
Mary Wilcox, Mary Wilcox NO.:  [id number]   MEDICAL RECORD NO.:  [id number]          PATIENT TYPE:  AMB   LOCATION:  NESC                         FACILITY:  [facility]   PHYSICIAN:  Bernestine Amass, M.D.  DATE OF BIRTH:  12/06/1955   DATE OF PROCEDURE:  10/06/2004  DATE OF DISCHARGE:  10/03/2004                                 OPERATIVE REPORT   PREOPERATIVE DIAGNOSES:  1.  Bilateral hydronephrosis.  2.  Bilateral distal ureteral obstruction secondary to locally advanced      cervical carcinoma.   POSTOPERATIVE DIAGNOSES:  1.  Bilateral hydronephrosis.  2.  Bilateral distal ureteral obstruction secondary to locally advanced      cervical carcinoma.   PROCEDURE PERFORMED:  Cystoscopy, bilateral retrograde pyelography,  bilateral double-J stent exchange.   SURGEON:  Bernestine Amass, M.D.   ANESTHESIA:  General.   INDICATIONS:  [name] originally presented to see me in March of this  year. She was sent because of unexplained renal insufficiency with a  creatinine in the 3-4 three to four range. The patient's creatinine  increased further, and an ultrasound showed bilateral hydronephrosis, again  of uncertain etiology.  At the time of evaluation, she had bilateral distal  ureteral obstruction and a very firm cervix.  She eventually was diagnosed  with cervical carcinoma and has undergone treatment for this.  She is now  due for stent exchange or reassessment of her urinary tract with possible  removal over double-J stents pending assessment. She has tolerated the  stents reasonably well and has no major difficulties. She has been diagnosed  with was felt to be stage IIB squamous cell carcinoma of the cervix.  Again  she has received chemotherapy and has had radiation therapy.   TECHNIQUE AND FINDINGS:  The patient was brought to the operating room where  she had the successful induction of general anesthesia.  She placed in the  lithotomy position, prepped,  draped in usual manner.  Cystoscopy revealed  two normally placed double-J stents. The right side was removed through a  guidewire.  A retrograde pyelogram was done with a cone-tip catheter. There  did not appear to be any high-grade obstruction, but there still was some  narrowing in the distal ureter.  For that reason we felt it prudent to go  ahead and put another double-J stent in.  The contralateral side was  similar.  The stent was removed without difficulty and retrograde continue  show an area of narrowing distally.  For that reason, the stent was  replaced.  At this point it is a little difficult to tell how much  obstruction she really has.  I think at this point would prudent to allow  her to have the double-J stents in for least a couple more months. We may  then remove one at a time in the office and follow her closely clinically  with  regard to pain as well as her renal function.  We will need to continue to  get serial ultrasounds, possible IVPs or renal scans to assess her.  If she  does well with one stent out we will probably remove the second one and then  continue to monitor her carefully.  She was brought to the recovery room in  stable condition.       DSG/MEDQ  D:  10/06/2004  T:  10/06/2004  Job:  [id number]   cc:   Selinda Orion, M.D.  [address]  [place] [address]  Fax: [phone]   Marti Sleigh, M.D.   Wallie Renshaw, M.D.  [address]  [place] [address]  Fax: [phone]   Blair Promise, M.D.  [address] [address]  Fax: [phone]

## 2010-08-01 NOTE — Discharge Summary (Signed)
NAMESURAIYA, BARIS NO.:  [id number]   MEDICAL RECORD NO.:  [id number]          PATIENT TYPE:  INP   LOCATION:  [id number]                         FACILITY:  [facility]   PHYSICIAN:  Blair Promise, M.D.  DATE OF BIRTH:  1955/11/17   DATE OF ADMISSION:  07/29/2004  DATE OF DISCHARGE:  08/01/2004                                 DISCHARGE SUMMARY   ADMISSION DIAGNOSES:  Stage III-B squamous cell carcinoma of the cervix.   REASON FOR ADMISSION:  Cesium 137 radiation intracavitary treatment.   DISCHARGE MEDICATIONS:  The patient is to resume her outpatient Norvasc 5 mg  p.o. daily, the patient is also to resume Compazine 10 mg q.6 h as needed  for nausea.   DISCHARGE INSTRUCTIONS:  The patient is to call if she should experience a  temperature of greater than 101.5 or to experience significant vaginal  bleeding.   FOLLOW UP:  The patient is to return to the radiation oncology clinic May 24  to resume her external beam radiation treatment.   DIET:  No restrictions.   ACTIVITY:  No restrictions.   HISTORY:  [name] is a very pleasant 55 year old female who earlier this  year was diagnosed with stage III-B squamous cell carcinoma of the cervix.  She has recently completed approximately 4 1/2 weeks of external beam  radiation therapy and was admitted earlier in the week for her cesium  intracavitary treatment using the Fletcher-Suit applicator.   On the morning of May 16, the patient was taken to the operating room and on  examination under anesthesia was noted to have an excellent response to her  radiation and chemotherapy. The patient subsequently had placement of a  Fletcher-Suit applicator under ultrasound guidance documenting good  placement of the tandem within the uterine canal. The patient, after  planning, then proceeded to undergo her treatment. The start time for the  patient's radiation was 3 p.m. on May 16. The end time and date for the  patient's  treatment was May 19 at 7 a.m., total duration for the patient's  treatment was 64 hours. The tandem was loaded with 20, 15, 10 mg sources.  The ovoids were each loaded with 10 mg sources. The average point A dose was  approximately 60 mg per hour. On the morning of May 19, the patient's  radiation sources were subsequently removed after the prescribed 64 hour  implant. A radiation survey was performed by Patrina Levering, CMD showing no  retained sources within the patient, the room or bedside. The patient's  Fletcher-Suit applicator was subsequently removed without difficulty. Later  in the morning, the patient was  subsequently discharged home. She was to resume her outpatient radiation  therapy in the middle of next week to complete her whole pelvis radiation.  The patient will then proceed with a side wall boost to complete her  definitive course of radiation and chemotherapy.      JDK/MEDQ  D:  08/01/2004  T:  08/01/2004  Job:  [id number]   cc:   Marti Sleigh, M.D.   Eilleen Kempf, MD  Fax: [phone]

## 2010-08-01 NOTE — Op Note (Signed)
NAMERENDA, BERNSTEIN NO.:  [id number]   MEDICAL RECORD NO.:  [id number]          PATIENT TYPE:  AMB   LOCATION:  NESC                         FACILITY:  [facility]   PHYSICIAN:  Bernestine Amass, M.D.  DATE OF BIRTH:  [birth year]   DATE OF PROCEDURE:  02/12/2005  DATE OF DISCHARGE:                                 OPERATIVE REPORT   PREOPERATIVE DIAGNOSIS:  Acute renal failure with bilateral hydronephrosis.   POSTOPERATIVE DIAGNOSIS:  Acute renal failure with bilateral hydronephrosis.   PROCEDURE PERFORMED:  Cystoscopy with bilateral double-J stent exchange.   SURGEON:  Bernestine Amass, M.D.   ANESTHESIA:  MAC.   INDICATIONS:  Ms. Mary Wilcox is a 55 year old female who recently was sent to  me because of unexplained bilateral hydronephrosis and renal insufficiency  with creatinine of around 5-6.  At the time of my assessment, I felt that  she had an abnormal cervical mass.  She ended up having bilateral double-J  stents placed, which were subsequently exchanged 3-4 months ago.  Her renal  function did return to normal status post initiation of the ureteral  drainage. She was due to come back and see me but has not followed up.  She  was recently in to see Dr. Jacelyn Grip, and noted have a creatinine of 6.  Ultrasound in our office did confirm some bilateral hydronephrosis, and we  felt that she probably had obstructive uropathy and that the prudent thing  to do initially was to do a double-J stent exchange and see if her  renal  function improved.  She presents now for that.   TECHNIQUE AND FINDINGS:  The patient was brought to the operating room where  she had some intravenous sedation. She was placed in the lithotomy position,  prepped and draped in the usual manner. Cystoscopy revealed stents in good  position.  These were 6-French 24 cm stents.  The right was partially  removed, and a guidewire was placed through the stent to the renal pelvis.  Over the guidewire, we  then placed a new 7-French 24 cm double-J  stent.  The same procedure was done on the contralateral side.  The patient  appeared to tolerate the procedure well.  There were no obvious  complications or problems.  She was brought to the recovery in stable  condition.  The patient will be seen in approximately 5-7 days to be sure  the renal function has indeed improved.           ______________________________  Bernestine Amass, M.D.  Electronically Signed     DSG/MEDQ  D:  02/12/2005  T:  02/12/2005  Job:  [id number]   cc:   Eilleen Kempf, MD  Fax: [phone]   Blair Promise, M.D.  Fax: [place]. Jacelyn Grip, M.D.  Fax: [phone]   Marti Sleigh, M.D.  [place]  [place]  [place] [address]

## 2010-08-01 NOTE — H&P (Signed)
Mary Wilcox, Mary Wilcox NO.:  [id number]   MEDICAL RECORD NO.:  [id number]          PATIENT TYPE:  EMS   LOCATION:  ED                           FACILITY:  [facility]   PHYSICIAN:  Ashby Dawes. Polite, M.D. DATE OF BIRTH:  [birth year]   DATE OF ADMISSION:  05/30/2004  DATE OF DISCHARGE:                                HISTORY & PHYSICAL   CHIEF COMPLAINT:  Leg swelling, hypertension.   HISTORY OF PRESENT ILLNESS:  A 55 year old female with no significant past  medical history except for hypertension diagnosed 1 week ago and leg  swelling at night. The patient presents to the ED for direct admission after  being evaluated on an outpatient basis for the above complaints. The patient  was found to have elevated blood pressure and acute renal failure. Initial  creatinine is 3.8. Follow-up creatinine here in the ED is 7.6. Other  outpatient labs showed that the patient had obstructive uropathy by  ultrasound confirmed by CT; however, a demonstrable mass could not be  identified. The patient is sent to the ED for direct admission and urologic  evaluation for cystoscopy with possible stent. The patient denies any fever  or chills, no nausea, no vomiting, no diarrhea, no constipation. Does admit  to some change in her urine output, that she has been going more frequently.  Denies any blood in her urine, denies any foul odor in her urine. As far as  nephrotoxic medications, the patient states she does take Motrin  approximately two tablets a day approximately 3-4 times a week. She has been  doing this since January for headache symptoms. As stated, the patient has  acute renal failure with, most likely obstructive uropathy. Therefore,  admission is deemed necessary for further evaluation and treatment.   PAST MEDICAL HISTORY:  As stated above. In addition, the patient had  reactive depression in 1996.   MEDICATIONS ON ADMISSION:  The patient was given Norvasc and Lasix on March  16.   SOCIAL HISTORY:  Negative for tobacco, alcohol, or drugs.   PAST SURGICAL HISTORY:  None.   ALLERGIES:  PROZAC, which causes hives.   FAMILY HISTORY:  Noncontributory.   REVIEW OF SYSTEMS:  As stated in the HPI; otherwise, negative.   PHYSICAL EXAMINATION:  GENERAL:  The patient is alert and oriented x3, no  apparent distress.  VITAL SIGNS:  Blood pressure 170/80 after IV labetalol. On presentation to  the ED, blood pressure 206/94. Pulse 89, respiratory rate 18, temperature  97.9, saturating 99%.  HEENT:  Within normal limits.  CHEST:  Clear to auscultation, no rales, no rhonchi.  CARDIOVASCULAR:  Regular, no S3, no rub.  ABDOMEN:  Soft, nontender, no hepatosplenomegaly.  EXTREMITIES:  Show 1+ edema, 2+ pulses.  NEUROLOGIC:  Nonfocal.   DATA:  CBC:  White count 11.5, hemoglobin 12.8, hematocrit 36.8, platelets  288, neutrophil count 79. BMET:  Sodium 138, potassium 4.8, chloride 106,  carbon dioxide 22, BUN 63, creatinine 7.6, AST and ALT within normal limits,  bilirubin 0.6. UA:  Specific gravity 1.007, glucose negative, ketones  negative, LE and nitrite  negative.   ASSESSMENT:  1.  Acute renal failure secondary to obstructive uropathy. Please note,      creatinine has worsened from 3.8 to 7.6. Also, please note there is no      problem with hyperkalemia or acidosis on current BMET. Please note, the      patient has had a CT on an outpatient basis which showed bilateral      hydronephrosis and hydroureter to the distal pelvis. However, no stone      or demonstrable obstructive mass was identified.  2.  Hypertension, most likely secondary to problem #1.  3.  Obesity.   RECOMMEND:  The patient be admitted to a telemetry bed. Obtain a STAT  urologic evaluation for urgent cystoscopy and possible stent. Will check  follow-up electrolytes daily and start IV fluids for gentle hydration. Will  use IV labetalol p.r.n. for blood pressure control. Will make further   recommendations after review of the above studies.      RDP/MEDQ  D:  05/30/2004  T:  05/30/2004  Job:  [id number]   cc:   Wallie Renshaw, M.D.  [address] [place]  [place] [address]  Fax: [phone]

## 2010-08-01 NOTE — Op Note (Signed)
NAMEMALIKA, Wilcox NO.:  [id number]   MEDICAL RECORD NO.:  [id number]          PATIENT TYPE:  INP   LOCATION:  0356                         FACILITY:  [facility]   PHYSICIAN:  Bernestine Amass, M.D.  DATE OF BIRTH:  05-25-1955   DATE OF PROCEDURE:  DATE OF DISCHARGE:                                 OPERATIVE REPORT   PREOPERATIVE DIAGNOSES:  1.  Acute renal failure.  2.  Bilateral hydronephrosis.   POSTOPERATIVE DIAGNOSES:  1.  Acute renal failure.  2.  Bilateral hydronephrosis.   PROCEDURE PERFORMED:  Cystoscopy, bilateral retrograde pyelography,  bilateral double-J stent placement, exam under anesthesia.   SURGEON:  Bernestine Amass, M.D.   ANESTHESIA:  General.   INDICATIONS:  Ms. Mary Wilcox is a young woman with no previous urologic  history.  She apparently went in to see her primary care doctor, Dr. Wallie Renshaw, recently because of some lower extremity edema.  She apparently  was noted to be hypertensive, which was a new diagnosis.  Some routine labs  were obtained, which showed an elevated serum creatinine in the 3-4 range.  She was sent appropriately for an ultrasound, which showed bilateral  hydronephrosis of uncertain etiology.  She subsequently had a noncontrast CT  scan which showed fairly moderate-to-severe bilateral hydronephrosis with  dilated ureters down to the level of the pelvis/bladder.  We were contacted  by Dr. Yaakov Guthrie.  We suggested that she be urgently admitted to the  hospital with assessment.  I spoke with the patient about her situation.  At  this point, all we know is that she has acute renal failure of uncertain  etiology, but it is likely to be obstructive in nature.  She clearly has  bilateral distal ureteral obstruction without evidence of obvious stone.  She had no evidence of obvious pelvic tumor, and the patient had no previous  history of malignancy.  My concern would be that she has an unrecognized  advanced  malignancy potentially causing extrinsic compression of her  ureters.   TECHNIQUE/FINDINGS:  Patient was brought to the operating room, where she  had successful induction of general anesthesia.  She was placed in the  lithotomy position and prepped and draped in the usual manner.  Cystoscopy  revealed a relatively unremarkable bladder with normal ureteral orifices  bilaterally.  Retrograde pyelograms bilaterally showed a severe ureteral  obstruction.  The distal 1-2 cm of both ureters appear to be extrinsically  compressed.  In the more proximal ureter and collecting system bilaterally  showed significant dilation.  With some difficulty, we were able to get a  Glidewire up both sides, and double-J stents were placed.  On the left, we  were able to get a 7 French 24 cm stent on the right.  We were limited to a  6 French 24 cm stent.  Both of these were done with fluoroscopic as well as  cystoscopic guidance and appeared to be in excellent position.  I feel that  the patient has extrinsic compression of her most distal intermural ureter.  The concern would be  that she has an infiltrative malignant process  obstructing both ureters.  There did not appear to be an obvious tumor  within her bladder, and my concern would be that she has an unrecognized GYN  malignancy as a potential cause for this.  Under examination under  anesthesia, I did feel that she had some induration of her cervix, but I did  not feel an  obvious discrete mass, and there appeared to be no bladder fixation.  At the  end of the procedure, a Foley catheter was inserted, and the patient did  appear to have evidence of a fairly immediate post-obstructive diuresis.  She was brought to the recovery room in stable condition.      DSG/MEDQ  D:  05/30/2004  T:  05/30/2004  Job:  [id number]   cc:   Edwyna Shell. Jacelyn Grip, M.D.  [address]  [place]  [place] [address]  Fax: [phone]

## 2010-08-01 NOTE — Discharge Summary (Signed)
Mary Wilcox, Mary Wilcox               ACCOUNT NO.:  [id number]   MEDICAL RECORD NO.:  [id number]          PATIENT TYPE:  INP   LOCATION:  [id number]                         FACILITY:  [facility]   PHYSICIAN:  Sheila Oats, M.D.DATE OF BIRTH:  [birth year]   DATE OF ADMISSION:  05/30/2004  DATE OF DISCHARGE:                                 DISCHARGE SUMMARY   DISCHARGE DIAGNOSES:  1.  Obstructive nephropathy/bilateral ureteral obstruction with bilateral      hydronephrosis.  2.  Acute renal failure secondary to #1.  3.  Hypertension, uncontrolled.  4.  History of depression.  5.  Obesity.   STUDIES/PROCEDURES:  1.  CT scan of the abdomen and pelvis.  There is marked bilateral      hydronephrosis and hydroureter with etiology of distal ureteral      obstruction, not determined on current study.  A nonobstructing 2.2 cm      gallstone.  Incidental right gluteal lipoma.  On the pelvic CT, distal      ureterectasis with etiology not determined on pelvic CT, right gluteal      lipoma.  2.  Renal ultrasound, moderate bilateral hydronephrosis.  Incidental      gallstones.  3.  Ultrasound of the pelvis.  There is a 2.1 x 1.2 cm in size hypoechoic      area in the fundal portion of the uterus.  May represent fluid within      the superior portion of the endometrial cavity or possibly hypoechoic      fibroid.  Follow-up study in 4-6 weeks may be helpful to determine if      persists or resolves.  Normal-appearing right ovary with      nonvisualization of the left ovary.  4.  Bilateral J-J stent placement, per urology, on March 17.   HISTORY:  The patient is a 55 year old white female with no significant past  medical history except for hypertension diagnosed about a week prior to  presentation, who presents with leg swelling.  The patient was brought to  the emergency department for direct admission after he was evaluated in the  outpatient clinic with the above complaints.  The patient was found  to have  an elevated blood pressure as well as acute renal failure.  The initial  creatinine was 3.8 and follow-up creatinine in the ER was noted to be 7.6.  Other outpatient labs showed that the patient had obstructive uropathy by  ultrasound, confirmed on CT; however, a mass or obstructing etiology could  not be identified.  The patient denied fever, chills, nausea, vomiting,  diarrhea, or constipation.  She did admit to a change in her urine output,  stating that she had been urinating more frequently.  She denied hematuria.  She admitted to taking Motrin, about two tablets a day, approximately 3-4  times a week and had been doing this since January for headaches.  The  patient reported that she had previously had acute renal failure that might  also have been secondary to obstructive uropathy.  The patient was admitted  to the [facility]' service  for further evaluation and management.   PHYSICAL EXAMINATION ON ADMISSION:  VITAL SIGNS:  Blood pressure 170/80  after she received IV labetalol.  Prior blood pressure was 206/94.  Her  pulse was 89.  Respiratory rate 18.  Temperature 97.9.  O2 sat 99%.  EXTREMITIES:  There was 1+ edema, +2 pulses.  The rest of the exam was  within normal limits.   LABORATORY DATA:  White cell count 11.5, hemoglobin 12.8, hematocrit 36.8,  platelet count 288, neutrophil count 79.  Sodium 138, potassium 4.8,  chloride 106, CO2 22, BUN 63, creatinine 7.6.  LFTs within normal limits.  The specific gravity of the urine was 1.007.  Glucose negative.  Ketones  negative.  Leukocyte esterase and nitrite negative.   HOSPITAL COURSE:  Problem 1:  Obstructive nephropathy/bilateral ureteral  obstruction:  Urology was consulted upon admission, and Dr. Risa Grill saw the  patient.  J stents were placed on May 30, 2004 with steady improvement in  her creatinine from 7.6 on admission to her last creatinine today, which was  3.  As already discussed, CT scan did not  show any etiology for the  obstruction.  A pelvic/intravaginal ultrasound was done, and the results are  as stated above with no clear cause of her obstruction still.  Dr. Risa Grill  has talked with the patient's gynecologist, and she is to follow up with her  OB/GYN for further evaluation for other possible causes of this obstruction.  Dr. Risa Grill saw the patient and recommended that she be discharged.  He gave  the patient a prescription for Macrobid 1 p.o. q.d. as well as Ditropan XL  10 mg p.o. daily.  Patient is to follow up with him.  Problem 2:  Acute renal failure secondary to #1:  It was also noted that the  patient had been on Motrin.  [name] creatinine steadily decreased after  the stents were placed, as already discussed above.  The patient is to  follow up with Dr. Risa Grill as scheduled.  NSAIDs were discontinued while the  patient was in the hospital.  Patient is to avoid this on discharge.  Problem 3:  Uncontrolled hypertension, likely secondary to #2:  The patient  was started on clonidine patches upon admission weekly and IV labetalol  p.r.n., but this was not controlling the patient's blood pressure and so  Norvasc 10 mg daily was added.  This antihypertensive still did not control  the patient's blood pressure, and so the clonidine patches were  discontinued, and the patient was started on the scheduled oral labetalol  along with her Norvasc.  The patient's blood pressure appeared to do better,  but shortly after she was given her BP meds, she was noted to be  orthostatic.  The patient was therefore kept in the hospital to monitor her  blood pressures and further adjust her blood pressure medications prior to  discharge.  The patient is currently on labetalol 200 mg p.o. b.i.d. and  Norvasc 10 mg p.o. q.d. and clonidine is ordered only on a p.r.n. basis for  systolic blood pressures greater than 160.  As already discussed, the patient's blood pressures will be monitored.  If  they remain stable and  controlled overnight, the patient will be discharged with the appropriate  antihypertensives to follow up with her primary care physician.  Problem 4:  Hypokalemia:  [name] potassium was replaced while she was in  the hospital.   DISCHARGE MEDICATIONS:  1.  Norvasc 10 mg p.o. q.d.  2.  Labetalol 200 mg p.o. q.d.  3.  Patient is to discontinue Lasix until she follows up with Dr. Risa Grill and      her primary care physician, Dr. Browning Blas.  4.  Macrobid 1 p.o. q.d.  5.  Ditropan XL 10 mg 1 p.o. q.d.  6.  Patient is to avoid anti-inflammatories/NSAIDs.   FOLLOW-UP CARE:  1.  Dr. Ubaldo Glassing, OB/GYN, upon discharge as scheduled.  2.  Dr. Risa Grill, urologist, as scheduled.  3.  Dr. Sela Hilding, primary care physician, in one week.   DISCHARGE CONDITION:  Improved.      ACV/MEDQ  D:  06/02/2004  T:  06/02/2004  Job:  [id number]   cc:   Wallie Renshaw, M.D.  [address] [place]  [place] [address]  Fax: [phone]

## 2010-08-01 NOTE — Op Note (Signed)
NAME:  Mary Wilcox, PACKER NO.:  [id number]   MEDICAL RECORD NO.:  [id number]          PATIENT TYPE:  INP   LOCATION:  0004                         FACILITY:  [facility]   PHYSICIAN:  Blair Promise, M.D.  DATE OF BIRTH:  19-May-1955   DATE OF PROCEDURE:  07/29/2004  DATE OF DISCHARGE:                                 OPERATIVE REPORT   PREOPERATIVE DIAGNOSES:  Stage IIIB squamous cell carcinoma of the cervix.   POSTOPERATIVE DIAGNOSES:  Stage IIIB squamous cell carcinoma of the cervix.   PROCEDURE:  1.  Examination under anesthesia.  2.  Placement of a Fletcher-Suit applicator in preparation for cesium 137      radiation treatment.   ANESTHESIA:  General.   SPECIMENS:  None.   DRAINS:  Foley catheter.   COMPLICATIONS:  None.   ESTIMATED BLOOD LOSS:  Minimal.   HISTORY:  [name] is a very pleasant 55 year old female with a history  of stage IIIB squamous cell carcinoma of the cervix. She initially presented  in renal failure and was found to have a creatinine of 7.6. She had  placement of bilateral ureteral stents for her hydronephrosis. She was  subsequently found to have on biopsy a cervical cancer. She was staged as a  clinical stage IIIB. The patient proceeded to undergo a combination of  external beam radiation therapy directed at the pelvis. In addition, the  patient receive radiosensitizing chemotherapy under the direction of Dr.  Julien Nordmann. The patient is now brought to the operating room for her  brachytherapy to complete her definitive course of treatment.   DESCRIPTION OF PROCEDURE:  [name] was taken to the operating room and  prepped in the usual sterile fashion. On examination under anesthesia, the  patient's cervical mass had decreased significantly in size. The vaginal  fornices were obliterated  by tumor. Two gold seed markers were placed at  the region of the cervix. The patient then underwent uterine sounding  dilatation. The uterus was  noted to be mildly anteverted. Intraoperative  ultrasound was used initially showing good image to the uterine sound and  equipment within the uterus. The patient did have approximately 200 mL of  sterile water placed within the bladder to improve ultrasound images. The  uterus sounded to approximately 6 cm. A moderate curvature tandem was placed  within the uterus without difficulty. The patient then had two ovoids placed  within the problem vagina. In light of the narrowed vaginal fornices  secondary to tumor, minimal shielding caps were placed to admit inferior  positioning of the ovoids. After placement of the  patient's ovoids then had packing of the vaginal area with sterile gauze  soaked in Estrace cream. The labia majora was then sewn together with two  sutures. The patient tolerated the procedure well. Later in the day, she  will undergo planning and eventual loading of her Fletcher-Suit applicator  for her cesium 137 radiation treatment.      JDK/MEDQ  D:  07/29/2004  T:  07/29/2004  Job:  [id number]   cc:   Eilleen Kempf, MD  Fax: [phone]   Marti Sleigh, M.D.

## 2010-08-01 NOTE — Op Note (Signed)
Mary Wilcox, BOTERO NO.:  [id number]   MEDICAL RECORD NO.:  [id number]          PATIENT TYPE:  AMB   LOCATION:  [facility]                         FACILITY:  [facility]   PHYSICIAN:  Bernestine Amass, M.D.  DATE OF BIRTH:  02/28/1956   DATE OF PROCEDURE:  04/24/2008  DATE OF DISCHARGE:                               OPERATIVE REPORT   PREOPERATIVE DIAGNOSES:  1. Chronic bilateral hydronephrosis.  2. History of cervical cancer.  3. Chronic renal failure.   POSTOPERATIVE DIAGNOSES:  1. Chronic bilateral hydronephrosis.  2. History of cervical cancer.  3. Chronic renal failure.   PROCEDURE PERFORMED:  Cystoscopy with bilateral retrograde pyelogram and  bilateral double-J stent exchange.   SURGEON:  Bernestine Amass, M.D.   ANESTHESIA:  General.   INDICATIONS:  Mary Wilcox has had a complicated history.  She is  currently 55 years of age.  The patient has had some longstanding  bilateral hydronephrosis.  This is thought to be secondary to her prior  history of cervical cancer with treatment and extrinsic compression of  her ureters.  Because of chronic hydronephrosis, she does have chronic  renal insufficiency.  Her creatinine had been in the 5 to 6 range.  Today her creatinine had actually improved to 3.7.  The actual status of  her cancer is not entirely clear since she has not had any follow-up  with oncology despite numerous suggestions that she make an appointment  for reassessment.  She has also been only intermittently compliant with  seeing her nephrologist.  The patient's last stent exchange was done in  June 2009 and therefore she is way overdue for double-J stent exchange.  Clinically she has, however, done well.   TECHNIQUE AND FINDINGS:  The patient was brought to the operating room  where she had successful induction of general anesthesia.  She received  perioperative antibiotics.  Cystoscopy revealed some mild diffuse  bladder erythema and inflammation.   Stents were seen in good position.  The left stent was partially extracted.  A guidewire was placed through  the stent.  The stent was then removed.  There were minimal to no  encrustations despite approximately 50-month dwell on the stent.  Retrograde pyelogram was performed by placing an open-ended catheter  over the wire and then removing the guidewire.  There continued to be  some chronic dilation of the renal pelvis.  One could not see a lot of  extrinsic compression of her ureter, however.  The guidewire was  replaced and a 7-French 24 cm stent was again inserted over the  guidewire.  Everything was done an analogous manner on the contralateral  side.  Again there was some dilation of the renal pelvis on retrograde  fluoroscopic interpretation.  Stent exchange occurred without  difficulty.  The patient does not appear to have any high-grade  obstruction of her ureter but the last time we did not put a stent and  she developed severe recurrent flank discomfort.  We need to continue to  encourage her to seek oncologic follow up as well as nephrology follow-  up  with regard to her situation.  In the interim, we will  continue to manage her with her double-J stents since I am concerned  about her compliance and the fact that she did develop severe recurrent  hydronephrosis and flank pain the last time we tried to leave those out.  She was brought to recovery room in stable condition.      Bernestine Amass, M.D.  Electronically Signed     DSG/MEDQ  D:  04/24/2008  T:  04/24/2008  Job:  [id number]   cc:   Eilleen Kempf, MD  Fax: [phone]   Sherril Croon, M.D.  Fax: [phone]

## 2010-08-01 NOTE — Op Note (Signed)
[name], Mary Wilcox NO.:  [id number]   MEDICAL RECORD NO.:  [id number]          PATIENT TYPE:  AMB   LOCATION:  [facility]                         FACILITY:  [facility]   PHYSICIAN:  Bernestine Amass, M.D.  DATE OF BIRTH:  [birth year]   DATE OF PROCEDURE:  DATE OF DISCHARGE:  04/28/2007                               OPERATIVE REPORT   PREOPERATIVE DIAGNOSES:  1. Chronic bilateral hydronephrosis.  2. Acute left flank pain with increased hydronephrosis.  3. History of metastatic/locally advanced cervical carcinoma.   POSTOPERATIVE DIAGNOSES:  1. Chronic bilateral hydronephrosis.  2. Acute left flank pain with increased hydronephrosis.  3. History of metastatic/locally advanced cervical carcinoma.   PROCEDURE PERFORMED:  Cystoscopy with bilateral double-J stent exchange  and left retrograde pyelogram.   SURGEON:  Bernestine Amass, MD   ANESTHESIA:  General.   INDICATIONS:  Mary Wilcox has had a complicated urologic history.  She  initially presented with acute renal failure and severe bilateral  hydronephrosis.  We noted abnormally hard cervix and eventually she was  diagnosed with locally advanced cervical cancer.  She was treated by  oncology and radiation oncology.  She never came back in for stent  exchange.  She subsequently presented with worsening of her renal  function.  New stents were placed, but her renal function never returned  to normal.  She has been a very noncompliant patient and just recently  missed a followup appointment to discuss stent exchange.  She has also  failed to go back to oncology despite what we feel is a high likelihood  of metastatic and/or recurrent disease.  She does have substantial  chronic renal insufficiency and sees Dr. Justin Mend for this.  She recently  presented with substantial increase in left flank pain.  CT showed  stents to be in good position, but what appeared to be severe  hydronephrosis suggesting an occluded left stent.   She presents now for  bilateral stent exchange.   TECHNIQUE AND FINDINGS:  The patient was brought to the operating room.  She had successful induction of general anesthesia.  Cystoscopy revealed  well-positioned stents bilaterally.  The right stent was partially  removed and the guide wire was placed through the stent.  A new 7-French  24 cm double-J stent was placed with direct visual as well as  fluoroscopic guidance.  On the left the stent was also partially removed  and the guide wire threaded through the stent.  We then placed an open-  ended catheter over the guide wire and removed the guide wire.  Retrograde pyelogram confirmed marked dilation of the left renal pelvis.  There did not appear to be an obvious filling defect, and I suspected  her hydronephrosis was due to stent occlusion.   Over the guide wire ew placed another 7-French, 24 cm stent, and again  confirmed good positioning.  The patient appeared to tolerate the  procedure well.  She was brought to the recovery room in stable  condition.           ______________________________  Bernestine Amass, M.D.  Electronically  Signed     DSG/MEDQ  D:  04/29/2007  T:  04/30/2007  Job:  [id number]

## 2010-08-13 ENCOUNTER — Ambulatory Visit (HOSPITAL_COMMUNITY)
Admission: RE | Admit: 2010-08-13 | Discharge: 2010-08-13 | Disposition: A | Discharge status: Home / Self Care | Payer: BC Managed Care – PPO | Source: Ambulatory Visit | Attending: Urology | Admitting: Urology

## 2010-08-13 DIAGNOSIS — C539 Malignant neoplasm of cervix uteri, unspecified: Secondary | ICD-10-CM | POA: Insufficient documentation

## 2010-08-13 DIAGNOSIS — Z91199 Patient's noncompliance with other medical treatment and regimen due to unspecified reason: Secondary | ICD-10-CM | POA: Insufficient documentation

## 2010-08-13 DIAGNOSIS — D649 Anemia, unspecified: Secondary | ICD-10-CM | POA: Insufficient documentation

## 2010-08-13 DIAGNOSIS — Z01812 Encounter for preprocedural laboratory examination: Secondary | ICD-10-CM | POA: Insufficient documentation

## 2010-08-13 DIAGNOSIS — Z9119 Patient's noncompliance with other medical treatment and regimen: Secondary | ICD-10-CM | POA: Insufficient documentation

## 2010-08-13 DIAGNOSIS — N189 Chronic kidney disease, unspecified: Secondary | ICD-10-CM | POA: Insufficient documentation

## 2010-08-13 DIAGNOSIS — N1330 Unspecified hydronephrosis: Secondary | ICD-10-CM | POA: Insufficient documentation

## 2010-08-13 LAB — CBC
HCT: 32.1 % — ABNORMAL LOW (ref 36.0–46.0)
Hemoglobin: 10.8 g/dL — ABNORMAL LOW (ref 12.0–15.0)
MCH: 30.1 pg (ref 26.0–34.0)
MCHC: 33.6 g/dL (ref 30.0–36.0)
MCV: 89.4 fL (ref 78.0–100.0)
Platelets: 383 10*3/uL (ref 150–400)
RBC: 3.59 MIL/uL — ABNORMAL LOW (ref 3.87–5.11)
RDW: 12.7 % (ref 11.5–15.5)
WBC: 11.5 10*3/uL — ABNORMAL HIGH (ref 4.0–10.5)

## 2010-08-13 LAB — POCT I-STAT 4, (NA,K, GLUC, HGB,HCT)
Glucose, Bld: 122 mg/dL — ABNORMAL HIGH (ref 70–99)
HCT: 35 % — ABNORMAL LOW (ref 36.0–46.0)
Hemoglobin: 11.9 g/dL — ABNORMAL LOW (ref 12.0–15.0)
Potassium: 3.8 mEq/L (ref 3.5–5.1)
Sodium: 139 mEq/L (ref 135–145)

## 2010-08-13 LAB — SURGICAL PCR SCREEN
MRSA, PCR: NEGATIVE
Staphylococcus aureus: NEGATIVE

## 2010-08-18 ENCOUNTER — Ambulatory Visit (HOSPITAL_BASED_OUTPATIENT_CLINIC_OR_DEPARTMENT_OTHER): Admission: RE | Admit: 2010-08-18 | Payer: BC Managed Care – PPO | Source: Ambulatory Visit | Admitting: Urology

## 2010-08-18 NOTE — Op Note (Signed)
Mary Wilcox, Mary Wilcox NO.:  [id number]  MEDICAL RECORD NO.:  [id number]           PATIENT TYPE:  O  LOCATION:  [facility]                         FACILITY:  [facility]  PHYSICIAN:  Bernestine Amass, M.D.  DATE OF BIRTH:  23-Mar-1955  DATE OF PROCEDURE: DATE OF DISCHARGE:                              OPERATIVE REPORT   PREOPERATIVE DIAGNOSES: 1. Chronic bilateral hydronephrosis. 2. Chronic renal failure.  POSTOPERATIVE DIAGNOSES: 1. Chronic bilateral hydronephrosis. 2. Chronic renal failure.  PROCEDURE PERFORMED:  Cystoscopy, bilateral retrograde pyelograms with fluoroscopic interpretation, bilateral double-J stent exchange.  SURGEON:  Bernestine Amass, M.D.  ANESTHESIA:  General.  INDICATIONS:  Ms. Mary Wilcox is 55 years of age.  We have cared for her some time but she is a chronically noncompliant patient.  When we first saw her, she had bilateral hydronephrosis and a markedly hard and fixed cervix.  She was subsequent diagnosed with cervical cancer and required double-J stents with normalization of her renal function.  The patient, subsequently, was seen by Oncology and also Nephrology.  Subsequently, she had her stents removed on one occasion but developed recurrent significant flank discomfort.  Double-J stents were replaced but she has chronically been noncompliant and has not shown up for stent exchanges. The patient previously had a baseline creatinine that had increased to the 3.5-5.5 range but the patient refused to have any ongoing followup with Dr. Edrick Wilcox from nephrology and also refused to have any oncology followup.  She continued to report that she felt fine and then think it was necessary.  The patient last had double-J stent exchanges in 2010.  She understood that she was supposed to have those exchanged every 4 to 6 months but has not been back to our office for over 2 years.  Again, this is because she has been feeling fine.  Recently, she did  have a little bit of low back discomfort on the right side but that resolved and she was concerned that maybe at this point she did need to be reassessed.  She was in our office recently and a KUB showed the stents to be in what appeared to be good position without any definitive calcifications or encrustations which was really quite surprising given the time frame involved.  Her creatinine, however, was over 7 with a normal potassium.  It was unclear how much of this has just been progression, of what is obviously some chronic renal failure versus obstruction due to poorly draining stents at this point.  We have moved her surgery up as urgently as possible and she presents now today for bilateral stent exchange.  TECHNIQUE AND FINDINGS:  The patient was brought to the operating room. She had successful induction of general anesthesia.  She was placed in lithotomy position and prepped and draped in the usual manner. Appropriate surgical time-out was performed.  Cystoscopy showed 2 stents to be in good position without any significant encrustations.  The right double-J stent was partially removed and a guidewire was placed up the stent without difficulty.  We then placed an open-ended catheter over the guidewire and the guidewire was removed.  Retrograde pyelogram showed a dilated proximal ureter and collecting system.  This certainly could be chronic dilation from reflux from the stent and there was a definitive evidence of obvious obstruction.  A new 7-French 24-cm stent was then placed over the guidewire with fluoroscopic as well as visual guidance.  Good positioning confirmed.  Attention was turned to the left side which was done in an analogous manner.  Retrograde pyelogram was done with fluoroscopic interpretation. Her ureter appeared fairly delicate but she did have a dilated renal pelvis once again.  A new stent was placed without difficulty.  Her bladder was emptied and she was  brought to recovery room in stable condition.  Preoperatively, I discussed the importance of her following up to assess renal function.  My plan will be to recheck a BUN and creatinine on her in approximately 4 to 5 days to see if there is a trend towards improvement.  It is extremely unlikely that urine creatinine is going to improve a whole lot given her history and I think it is imperative that she be reassessed by Nephrology.  We will try to get her back and see Dr. Edrick Wilcox.     Bernestine Amass, M.D.     DSG/MEDQ  D:  08/13/2010  T:  08/13/2010  Job:  [id number]  cc:   Sherril Croon, M.D. [phone]  Electronically Signed by Rana Snare M.D. on 08/18/2010 12:13:52 PM

## 2010-09-12 ENCOUNTER — Encounter: Payer: BC Managed Care – PPO | Admitting: Internal Medicine

## 2010-09-23 ENCOUNTER — Encounter (INDEPENDENT_AMBULATORY_CARE_PROVIDER_SITE_OTHER): Payer: BC Managed Care – PPO

## 2010-09-23 ENCOUNTER — Ambulatory Visit (INDEPENDENT_AMBULATORY_CARE_PROVIDER_SITE_OTHER): Payer: BC Managed Care – PPO | Admitting: Vascular Surgery

## 2010-09-23 ENCOUNTER — Ambulatory Visit: Payer: BC Managed Care – PPO | Admitting: Vascular Surgery

## 2010-09-23 DIAGNOSIS — Z01810 Encounter for preprocedural cardiovascular examination: Secondary | ICD-10-CM

## 2010-09-23 DIAGNOSIS — N189 Chronic kidney disease, unspecified: Secondary | ICD-10-CM

## 2010-09-23 DIAGNOSIS — N186 End stage renal disease: Secondary | ICD-10-CM

## 2010-09-23 NOTE — Consult Note (Signed)
NEW PATIENT CONSULTATION  Mary Wilcox, Mary Wilcox DOB:  [birth year]                                       09/23/2010 CHART#:[id number]  The patient is a 55 year old female with chronic renal insufficiency approaching hemodialysis referred for vascular access by Dr. Edrick Oh.  The patient is right-handed.  She has never been on hemodialysis. She has a history of ureteral stents performed by Dr. Risa Grill and recently had a creatinine up to 7 which decreased to 6 after some further stenting procedures.  CHRONIC MEDICAL PROBLEMS: 1. History of cervical cancer with chemotherapy utilized through left     upper extremity the most. 2. History of ureteral stents. 3. Chronic renal insufficiency. 4. Negative for diabetes, hypertension, hyperlipidemia, COPD or     stroke.  SOCIAL HISTORY:  The patient is single, has 2 children, is a Counsellor. Does not use tobacco or alcohol.  FAMILY HISTORY:  Negative for coronary artery disease, diabetes or stroke.  REVIEW OF SYSTEMS:  Negative for chest pain, dyspnea on exertion, PND, orthopnea.  Does have depression, chronic renal insufficiency.  All other systems negative in a complete review of systems.  PHYSICAL EXAM:  Vital signs:  Blood pressure 164/70, heart rate 95, respirations 18.  General:  She is a well-developed, well-nourished female in no apparent distress, alert and oriented x3.  HEENT:  Exam normal for age.  EOMs intact.  Lungs:  Clear to auscultation.  No rhonchi or wheezing.  Cardiovascular:  Regular rhythm.  No murmurs. Carotid pulses 3+.  No audible bruits.  Abdomen:  Soft, nontender with no masses.  Musculoskeletal:  Exam is free of major deformities. Neurological:  Normal.  Skin:  Free of rashes.  Upper extremities:  Exam reveals 3+ brachial and 2+ radial pulses bilaterally.  She does not have any obvious surface veins on physical exam which would be adequate for AV fistula.  Today I ordered a bilateral upper  extremity vein mapping which I have reviewed and interpreted and I also performed an independent bedside SonoSite ultrasound exam of both upper extremities.  She has borderline cephalic veins in the upper arms bilaterally which are slightly deep and may not be suitable in the long run for AV fistulas although their use is a possibility for maturation.  Basilic veins are larger with the right being larger than the left and a right basilic vein transposition would be the next option after brachiocephalic fistulas.  I discussed this at length with her and suggested that we try a left upper arm brachiocephalic fistula as an initial step which I think has about a 50/50 chance of maturing and being adequate.  If that is not adequate then I think we should then move to a right basilic vein transposition as the next step.  She is not ready to proceed with this and wants to talk further with Dr. Justin Mend and when she is ready to proceed we will be happy to schedule it as soon as our schedule permits.    Mary Wilcox, M.D. Electronically Signed  JDL/MEDQ  D:  09/23/2010  T:  09/23/2010  Job:  [id number]  cc:   Sherril Croon, M.D.

## 2010-10-01 NOTE — Procedures (Unsigned)
CEPHALIC VEIN MAPPING  INDICATION:  Upper extremity preoperative vein mapping.  HISTORY:  EXAM:  The right cephalic vein is compressible.  Diameter measurements range from 0.24 to 0.40 cm.  The right basilic vein is compressible.  Diameter measurements range from 0.3 to 0.5 cm.  The left cephalic vein is compressible.  Diameter measurements range from 0.25 to 0.3 cm.  The left basilic vein is compressible.  Diameter measurements range from 0.3 to 0.46 cm.  See attached worksheet for all measurements.  IMPRESSION:  Patent bilateral cephalic and basilic veins with diameter measurements as described above.  ___________________________________________ Nelda Severe. Kellie Simmering, M.D.  CH/MEDQ  D:  09/25/2010  T:  09/25/2010  Job:  [id number]

## 2010-12-05 LAB — POCT I-STAT 4, (NA,K, GLUC, HGB,HCT)
Glucose, Bld: 90
HCT: 36
Hemoglobin: 12.2
Operator id: 268271
Potassium: 4.3
Sodium: 140

## 2010-12-11 LAB — POCT I-STAT 4, (NA,K, GLUC, HGB,HCT)
Glucose, Bld: 91
HCT: 33 — ABNORMAL LOW
Hemoglobin: 11.2 — ABNORMAL LOW
Operator id: 268271
Potassium: 4.3
Sodium: 142

## 2010-12-23 LAB — CBC
HCT: 25.8 — ABNORMAL LOW
Hemoglobin: 8.9 — ABNORMAL LOW
MCHC: 34.3
MCV: 86.3
Platelets: 507 — ABNORMAL HIGH
RBC: 2.99 — ABNORMAL LOW
RDW: 13.8
WBC: 13.4 — ABNORMAL HIGH

## 2010-12-23 LAB — BASIC METABOLIC PANEL
BUN: 58 — ABNORMAL HIGH
CO2: 20
Calcium: 8.8
Chloride: 107
Creatinine, Ser: 6.3 — ABNORMAL HIGH
GFR calc Af Amer: 8 — ABNORMAL LOW
GFR calc non Af Amer: 7 — ABNORMAL LOW
Glucose, Bld: 133 — ABNORMAL HIGH
Potassium: 3.9
Sodium: 138

## 2010-12-25 LAB — POCT HEMOGLOBIN-HEMACUE
Hemoglobin: 12.7
Operator id: 268271

## 2010-12-31 LAB — DIFFERENTIAL
Basophils Absolute: 0
Basophils Relative: 0
Eosinophils Absolute: 0.1
Eosinophils Relative: 1
Lymphocytes Relative: 7 — ABNORMAL LOW
Lymphs Abs: 1.1
Monocytes Absolute: 0.8 — ABNORMAL HIGH
Monocytes Relative: 5
Neutro Abs: 14.9 — ABNORMAL HIGH
Neutrophils Relative %: 88 — ABNORMAL HIGH

## 2010-12-31 LAB — BASIC METABOLIC PANEL
BUN: 44 — ABNORMAL HIGH
BUN: 44 — ABNORMAL HIGH
BUN: 44 — ABNORMAL HIGH
BUN: 47 — ABNORMAL HIGH
BUN: 54 — ABNORMAL HIGH
CO2: 20
CO2: 21
CO2: 21
CO2: 22
CO2: 24
Calcium: 8.2 — ABNORMAL LOW
Calcium: 8.3 — ABNORMAL LOW
Calcium: 8.5
Calcium: 8.5
Calcium: 8.7
Chloride: 105
Chloride: 106
Chloride: 106
Chloride: 107
Chloride: 108
Creatinine, Ser: 4.41 — ABNORMAL HIGH
Creatinine, Ser: 4.46 — ABNORMAL HIGH
Creatinine, Ser: 4.59 — ABNORMAL HIGH
Creatinine, Ser: 5.01 — ABNORMAL HIGH
Creatinine, Ser: 5.51 — ABNORMAL HIGH
GFR calc Af Amer: 10 — ABNORMAL LOW
GFR calc Af Amer: 11 — ABNORMAL LOW
GFR calc Af Amer: 12 — ABNORMAL LOW
GFR calc Af Amer: 13 — ABNORMAL LOW
GFR calc Af Amer: 13 — ABNORMAL LOW
GFR calc non Af Amer: 10 — ABNORMAL LOW
GFR calc non Af Amer: 10 — ABNORMAL LOW
GFR calc non Af Amer: 11 — ABNORMAL LOW
GFR calc non Af Amer: 8 — ABNORMAL LOW
GFR calc non Af Amer: 9 — ABNORMAL LOW
Glucose, Bld: 105 — ABNORMAL HIGH
Glucose, Bld: 112 — ABNORMAL HIGH
Glucose, Bld: 126 — ABNORMAL HIGH
Glucose, Bld: 133 — ABNORMAL HIGH
Glucose, Bld: 98
Potassium: 4.1
Potassium: 4.3
Potassium: 4.4
Potassium: 4.9
Potassium: 5.2 — ABNORMAL HIGH
Sodium: 136
Sodium: 137
Sodium: 138
Sodium: 138
Sodium: 140

## 2010-12-31 LAB — URINALYSIS, ROUTINE W REFLEX MICROSCOPIC
Bilirubin Urine: NEGATIVE
Glucose, UA: NEGATIVE
Ketones, ur: NEGATIVE
Nitrite: POSITIVE — AB
Protein, ur: >300 — AB
Specific Gravity, Urine: 1.013
Urobilinogen, UA: 0.2
pH: 6.5

## 2010-12-31 LAB — URINE MICROSCOPIC-ADD ON

## 2010-12-31 LAB — CBC
HCT: 29.8 — ABNORMAL LOW
Hemoglobin: 10 — ABNORMAL LOW
MCHC: 33.7
MCV: 90.9
Platelets: 524 — ABNORMAL HIGH
RBC: 3.28 — ABNORMAL LOW
RDW: 14
WBC: 16.9 — ABNORMAL HIGH

## 2010-12-31 LAB — URINE CULTURE
Colony Count: NO GROWTH
Culture: NO GROWTH

## 2010-12-31 LAB — RENAL FUNCTION PANEL
Albumin: 2.6 — ABNORMAL LOW
BUN: 51 — ABNORMAL HIGH
CO2: 20
Calcium: 8.7
Chloride: 109
Creatinine, Ser: 4.12 — ABNORMAL HIGH
GFR calc Af Amer: 14 — ABNORMAL LOW
GFR calc non Af Amer: 11 — ABNORMAL LOW
Glucose, Bld: 92
Phosphorus: 5.8 — ABNORMAL HIGH
Potassium: 4
Sodium: 139

## 2010-12-31 LAB — CREATININE, URINE, RANDOM: Creatinine, Urine: 55.4

## 2010-12-31 LAB — PROTEIN, URINE, RANDOM: Total Protein, Urine: 124

## 2011-02-27 ENCOUNTER — Other Ambulatory Visit: Payer: Self-pay | Admitting: *Deleted

## 2011-03-04 ENCOUNTER — Encounter (HOSPITAL_COMMUNITY): Payer: Self-pay

## 2011-03-04 ENCOUNTER — Inpatient Hospital Stay: Admission: RE | Admit: 2011-03-04 | Payer: BC Managed Care – PPO | Source: Ambulatory Visit

## 2011-03-04 ENCOUNTER — Other Ambulatory Visit: Payer: Self-pay | Admitting: *Deleted

## 2011-03-04 ENCOUNTER — Inpatient Hospital Stay (HOSPITAL_COMMUNITY)
Admission: EM | Admit: 2011-03-04 | Discharge: 2011-03-15 | DRG: 315 | Disposition: A | Discharge status: Home / Self Care | Payer: BC Managed Care – PPO | Attending: Internal Medicine | Admitting: Internal Medicine

## 2011-03-04 ENCOUNTER — Emergency Department (HOSPITAL_COMMUNITY): Payer: BC Managed Care – PPO

## 2011-03-04 ENCOUNTER — Other Ambulatory Visit: Payer: Self-pay | Admitting: Nephrology

## 2011-03-04 ENCOUNTER — Encounter (HOSPITAL_COMMUNITY): Payer: Self-pay | Admitting: *Deleted

## 2011-03-04 DIAGNOSIS — D473 Essential (hemorrhagic) thrombocythemia: Secondary | ICD-10-CM | POA: Diagnosis not present

## 2011-03-04 DIAGNOSIS — E872 Acidosis, unspecified: Secondary | ICD-10-CM

## 2011-03-04 DIAGNOSIS — N1330 Unspecified hydronephrosis: Secondary | ICD-10-CM

## 2011-03-04 DIAGNOSIS — N185 Chronic kidney disease, stage 5: Secondary | ICD-10-CM

## 2011-03-04 DIAGNOSIS — M545 Low back pain, unspecified: Secondary | ICD-10-CM

## 2011-03-04 DIAGNOSIS — D72829 Elevated white blood cell count, unspecified: Secondary | ICD-10-CM

## 2011-03-04 DIAGNOSIS — D75839 Thrombocytosis, unspecified: Secondary | ICD-10-CM | POA: Diagnosis present

## 2011-03-04 DIAGNOSIS — N13722 Vesicoureteral-reflux with reflux nephropathy without hydroureter, bilateral: Secondary | ICD-10-CM | POA: Diagnosis present

## 2011-03-04 DIAGNOSIS — N19 Unspecified kidney failure: Secondary | ICD-10-CM

## 2011-03-04 DIAGNOSIS — B965 Pseudomonas (aeruginosa) (mallei) (pseudomallei) as the cause of diseases classified elsewhere: Secondary | ICD-10-CM | POA: Diagnosis present

## 2011-03-04 DIAGNOSIS — R1032 Left lower quadrant pain: Secondary | ICD-10-CM

## 2011-03-04 DIAGNOSIS — N186 End stage renal disease: Principal | ICD-10-CM

## 2011-03-04 DIAGNOSIS — E079 Disorder of thyroid, unspecified: Secondary | ICD-10-CM | POA: Diagnosis present

## 2011-03-04 DIAGNOSIS — N179 Acute kidney failure, unspecified: Secondary | ICD-10-CM

## 2011-03-04 DIAGNOSIS — G253 Myoclonus: Secondary | ICD-10-CM | POA: Diagnosis present

## 2011-03-04 DIAGNOSIS — N390 Urinary tract infection, site not specified: Secondary | ICD-10-CM

## 2011-03-04 DIAGNOSIS — R5381 Other malaise: Secondary | ICD-10-CM | POA: Diagnosis not present

## 2011-03-04 DIAGNOSIS — Z8541 Personal history of malignant neoplasm of cervix uteri: Secondary | ICD-10-CM

## 2011-03-04 DIAGNOSIS — R209 Unspecified disturbances of skin sensation: Secondary | ICD-10-CM | POA: Diagnosis not present

## 2011-03-04 DIAGNOSIS — N189 Chronic kidney disease, unspecified: Secondary | ICD-10-CM | POA: Diagnosis present

## 2011-03-04 DIAGNOSIS — D631 Anemia in chronic kidney disease: Secondary | ICD-10-CM

## 2011-03-04 DIAGNOSIS — N039 Chronic nephritic syndrome with unspecified morphologic changes: Secondary | ICD-10-CM | POA: Diagnosis present

## 2011-03-04 DIAGNOSIS — N2581 Secondary hyperparathyroidism of renal origin: Secondary | ICD-10-CM | POA: Diagnosis present

## 2011-03-04 DIAGNOSIS — R112 Nausea with vomiting, unspecified: Secondary | ICD-10-CM | POA: Diagnosis present

## 2011-03-04 DIAGNOSIS — I10 Essential (primary) hypertension: Secondary | ICD-10-CM

## 2011-03-04 LAB — URINALYSIS, ROUTINE W REFLEX MICROSCOPIC
Bilirubin Urine: NEGATIVE
Glucose, UA: NEGATIVE mg/dL
Ketones, ur: NEGATIVE mg/dL
Nitrite: NEGATIVE
Protein, ur: >300 mg/dL — AB
Specific Gravity, Urine: 1.016 (ref 1.005–1.030)
Urobilinogen, UA: 0.2 mg/dL (ref 0.0–1.0)
pH: 5.5 (ref 5.0–8.0)

## 2011-03-04 LAB — URINE MICROSCOPIC-ADD ON

## 2011-03-04 LAB — DIFFERENTIAL
Basophils Absolute: 0 10*3/uL (ref 0.0–0.1)
Basophils Relative: 0 % (ref 0–1)
Eosinophils Absolute: 0 10*3/uL (ref 0.0–0.7)
Eosinophils Relative: 0 % (ref 0–5)
Lymphocytes Relative: 8 % — ABNORMAL LOW (ref 12–46)
Lymphs Abs: 2.5 10*3/uL (ref 0.7–4.0)
Monocytes Absolute: 1.9 10*3/uL — ABNORMAL HIGH (ref 0.1–1.0)
Monocytes Relative: 6 % (ref 3–12)
Neutro Abs: 26.7 10*3/uL — ABNORMAL HIGH (ref 1.7–7.7)
Neutrophils Relative %: 86 % — ABNORMAL HIGH (ref 43–77)

## 2011-03-04 LAB — POCT I-STAT 3, ART BLOOD GAS (G3+)
Acid-base deficit: 17 mmol/L — ABNORMAL HIGH (ref 0.0–2.0)
Bicarbonate: 7.9 mEq/L — ABNORMAL LOW (ref 20.0–24.0)
O2 Saturation: 98 %
TCO2: 8 mmol/L (ref 0–100)
pCO2 arterial: 17.5 mmHg — CL (ref 35.0–45.0)
pH, Arterial: 7.264 — ABNORMAL LOW (ref 7.350–7.400)
pO2, Arterial: 115 mmHg — ABNORMAL HIGH (ref 80.0–100.0)

## 2011-03-04 LAB — CBC
HCT: 24.6 % — ABNORMAL LOW (ref 36.0–46.0)
Hemoglobin: 8.8 g/dL — ABNORMAL LOW (ref 12.0–15.0)
MCH: 31.4 pg (ref 26.0–34.0)
MCHC: 35.8 g/dL (ref 30.0–36.0)
MCV: 87.9 fL (ref 78.0–100.0)
Platelets: 741 10*3/uL — ABNORMAL HIGH (ref 150–400)
RBC: 2.8 MIL/uL — ABNORMAL LOW (ref 3.87–5.11)
RDW: 14.8 % (ref 11.5–15.5)
WBC: 31.1 10*3/uL — ABNORMAL HIGH (ref 4.0–10.5)

## 2011-03-04 LAB — BASIC METABOLIC PANEL
BUN: 211 mg/dL — ABNORMAL HIGH (ref 6–23)
CO2: 9 mEq/L — CL (ref 19–32)
Calcium: 11.1 mg/dL — ABNORMAL HIGH (ref 8.4–10.5)
Chloride: 85 mEq/L — ABNORMAL LOW (ref 96–112)
Creatinine, Ser: 16.44 mg/dL — ABNORMAL HIGH (ref 0.50–1.10)
GFR calc Af Amer: 2 mL/min — ABNORMAL LOW (ref >90)
GFR calc non Af Amer: 2 mL/min — ABNORMAL LOW (ref >90)
Glucose, Bld: 100 mg/dL — ABNORMAL HIGH (ref 70–99)
Potassium: 3.9 mEq/L (ref 3.5–5.1)
Sodium: 127 mEq/L — ABNORMAL LOW (ref 135–145)

## 2011-03-04 LAB — PROTIME-INR
INR: 1.3 (ref 0.00–1.49)
Prothrombin Time: 16.4 seconds — ABNORMAL HIGH (ref 11.6–15.2)

## 2011-03-04 LAB — APTT: aPTT: 37 seconds (ref 24–37)

## 2011-03-04 IMAGING — CR DG CHEST 1V PORT
1 series · 1 of 1 positions shown · non-contrast
Comparison: [HOSPITAL] chest radiograph dated [DATE]

CLINICAL DATA: Shortness of breath, elevated white count

PORTABLE CHEST - 1 VIEW

[view not recorded]
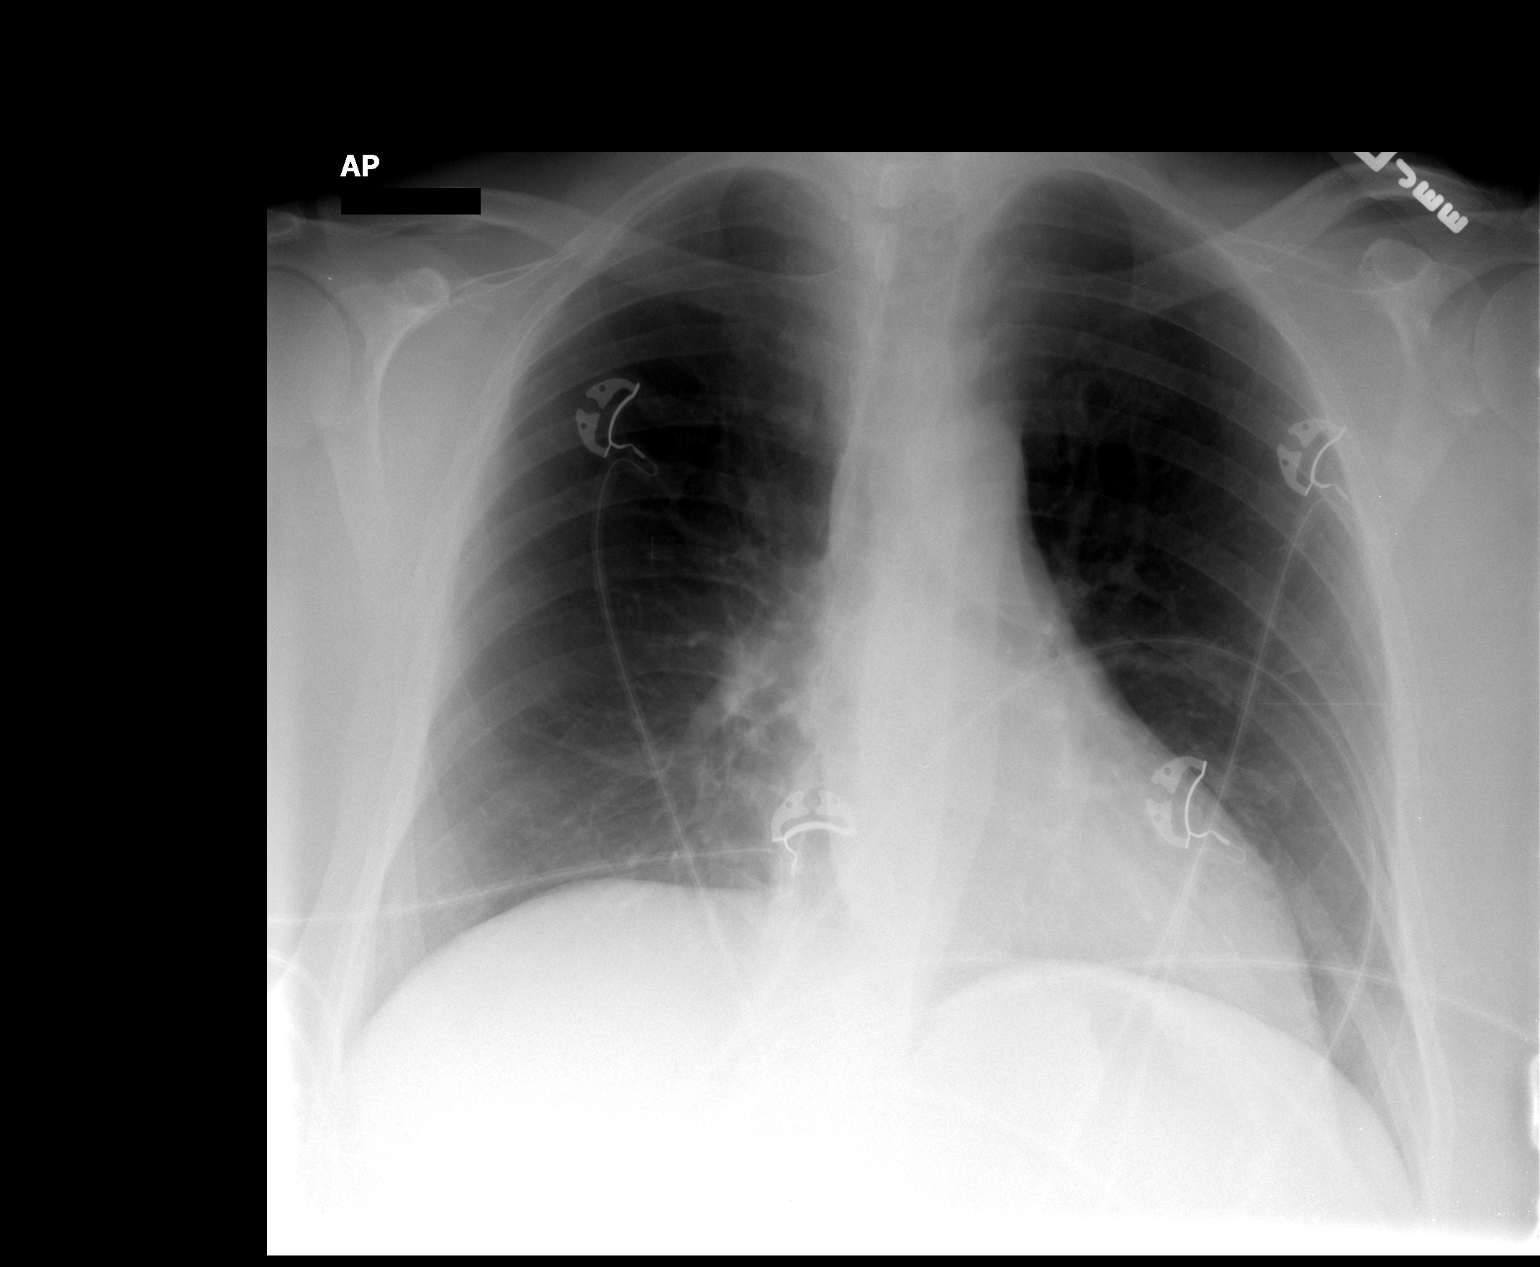

[1 of 1 positions shown; findings below may reference images not displayed]

FINDINGS: Lungs are clear.  No pleural effusion or pneumothorax.

Cardiomediastinal silhouette is within normal limits.
IMPRESSION: No evidence of acute cardiopulmonary disease.

## 2011-03-04 MED ORDER — SODIUM BICARBONATE 8.4 % IV SOLN
INTRAVENOUS | Status: DC
  Administered 2011-03-04 – 2011-03-05 (×2): via INTRAVENOUS
  Filled 2011-03-04 (×5): qty 150

## 2011-03-04 MED ORDER — SODIUM CHLORIDE 0.9 % IV SOLN: INTRAVENOUS | Status: DC

## 2011-03-04 MED ORDER — DEXTROSE 5 % IV SOLN
1.5000 g | INTRAVENOUS | Status: DC
  Filled 2011-03-04: qty 1.5

## 2011-03-04 NOTE — ED Notes (Signed)
Received pt. From or via w/c pt. Alert and oriented NAD noted, pt. Transferred to stretcher , weakness noted, pt. Placed on cardiac monitor

## 2011-03-04 NOTE — ED Provider Notes (Signed)
History     CSN: [id number] Arrival date & time: 03/04/2011  5:57 PM   First MD Initiated Contact with Patient 03/04/11 1911      Chief Complaint  Patient presents with  . Acute Renal Failure    (Consider location/radiation/quality/duration/timing/severity/associated sxs/prior treatment) The history is provided by the patient.   patient states she was told to come in for dialysis. She states she saw Dr. Justin Mend was told that she had dialysis. She states she just feels bad overall. No nausea vomiting diarrhea. No chest pain. Feels little dizzy.  Past Medical History  Diagnosis Date  . Chronic kidney disease   . Thyroid disorder   . Cancer     cervical ca 2006    Past Surgical History  Procedure Date  . Ureter surgery     stent  . Tubal ligation     History reviewed. No pertinent family history.  History  Substance Use Topics  . Smoking status: Never Smoker   . Smokeless tobacco: Not on file  . Alcohol Use: No    OB History    Grav Para Term Preterm Abortions TAB SAB Ect Mult Living                  Review of Systems  Constitutional: Positive for chills and fatigue.  HENT: Negative for ear pain.   Respiratory: Positive for shortness of breath. Negative for cough.   Cardiovascular: Negative for chest pain and leg swelling.  Genitourinary: Negative for flank pain.  Musculoskeletal: Negative for back pain.  Neurological: Negative for numbness.    Allergies  Prozac  Home Medications   Current Outpatient Rx  Name Route Sig Dispense Refill  . RENA-VITE PO TABS Oral Take 1 tablet by mouth daily.      Marland Kitchen PARICALCITOL 1 MCG PO CAPS Oral Take 1 mcg by mouth daily.        BP 157/83  Pulse 61  Temp(Src) 97.9 F (36.6 C) (Oral)  Resp 22  SpO2 100%  Physical Exam  Nursing note and vitals reviewed. Constitutional: She is oriented to person, place, and time. She appears well-developed and well-nourished.       Patient was initially ill appearing, but improved  somewhat with IV fluids.  HENT:  Head: Normocephalic and atraumatic.  Eyes: EOM are normal. Pupils are equal, round, and reactive to light.  Neck: Normal range of motion. Neck supple.  Cardiovascular: Normal rate, regular rhythm and normal heart sounds.   No murmur heard. Pulmonary/Chest: Effort normal and breath sounds normal. No respiratory distress. She has no wheezes. She has no rales.  Abdominal: Soft. Bowel sounds are normal. She exhibits no distension. There is no tenderness. There is no rebound and no guarding.  Musculoskeletal: Normal range of motion.  Neurological: She is alert and oriented to person, place, and time. No cranial nerve deficit.  Skin: Skin is warm and dry.  Psychiatric: She has a normal mood and affect. Her speech is normal.    ED Course  Procedures (including critical care time)  Labs Reviewed  CBC - Abnormal; Notable for the following:    WBC 31.1 (*)    RBC 2.80 (*)    Hemoglobin 8.8 (*)    HCT 24.6 (*)    Platelets 741 (*)    All other components within normal limits  DIFFERENTIAL - Abnormal; Notable for the following:    Neutrophils Relative 86 (*)    Lymphocytes Relative 8 (*)    Neutro Abs 26.7 (*)  Monocytes Absolute 1.9 (*)    All other components within normal limits  BASIC METABOLIC PANEL - Abnormal; Notable for the following:    Sodium 127 (*)    Chloride 85 (*)    CO2 9 (*)    Glucose, Bld 100 (*)    BUN 211 (*)    Creatinine, Ser 16.44 (*)    Calcium 11.1 (*)    GFR calc non Af Amer 2 (*)    GFR calc Af Amer 2 (*)    All other components within normal limits  PROTIME-INR - Abnormal; Notable for the following:    Prothrombin Time 16.4 (*)    All other components within normal limits  URINALYSIS, ROUTINE W REFLEX MICROSCOPIC - Abnormal; Notable for the following:    Color, Urine RED (*) BIOCHEMICALS MAY BE AFFECTED BY COLOR   APPearance CLOUDY (*)    Hgb urine dipstick LARGE (*)    Protein, ur >300 (*)    Leukocytes, UA LARGE  (*)    All other components within normal limits  POCT I-STAT 3, BLOOD GAS (G3+) - Abnormal; Notable for the following:    pH, Arterial 7.264 (*)    pCO2 arterial 17.5 (*)    pO2, Arterial 115.0 (*)    Bicarbonate 7.9 (*)    Acid-base deficit 17.0 (*)    All other components within normal limits  URINE MICROSCOPIC-ADD ON - Abnormal; Notable for the following:    Squamous Epithelial / LPF MANY (*)    Bacteria, UA MANY (*)    All other components within normal limits  APTT   Dg Chest Port 1 View  03/04/2011  *RADIOLOGY REPORT*  Clinical Data: Shortness of breath, elevated white count  PORTABLE CHEST - 1 VIEW  Comparison: [facility] chest radiograph dated 07/25/2004  Findings: Lungs are clear.  No pleural effusion or pneumothorax.  Cardiomediastinal silhouette is within normal limits.  IMPRESSION: No evidence of acute cardiopulmonary disease.  Original Report Authenticated By: Julian Hy, M.D.     1. Renal failure   2. Metabolic acidosis   3. Leukocytosis     Date: 03/04/2011  Rate: 62  Rhythm: normal sinus rhythm  QRS Axis: normal  Intervals: normal  ST/T Wave abnormalities: normal  Conduction Disutrbances:none  Narrative Interpretation:   Old EKG Reviewed: unchanged     MDM  Patient was sent in from nephrology for admission to medicine. I discussed with Dr. Posey Pronto. Patient had outside laboratory showed a white count of 29. BUN 200. Creatinine 17. A bicarbonate of 8. Her potassium was 4.8. She's not had dialysis access yet. She initially looked ill but improved with fluids. Chest x-ray does not show infection. Urine now shows a UTI. She's in metabolic and respiratory acidosis. She was seen by critical care, but they discuss with medicine and she'll be a step down admission for medicine.        Jasper Riling. Alvino Chapel, MD 03/05/11 [phone]

## 2011-03-04 NOTE — ED Notes (Signed)
pt

## 2011-03-05 ENCOUNTER — Encounter (HOSPITAL_COMMUNITY)
Admission: EM | Disposition: A | Discharge status: Home / Self Care | Payer: Self-pay | Source: Home / Self Care | Attending: Internal Medicine

## 2011-03-05 ENCOUNTER — Ambulatory Visit (HOSPITAL_COMMUNITY)
Admission: RE | Admit: 2011-03-05 | Payer: BC Managed Care – PPO | Source: Ambulatory Visit | Admitting: Vascular Surgery

## 2011-03-05 ENCOUNTER — Inpatient Hospital Stay (HOSPITAL_COMMUNITY): Payer: BC Managed Care – PPO | Admitting: Anesthesiology

## 2011-03-05 ENCOUNTER — Encounter (HOSPITAL_COMMUNITY): Payer: Self-pay | Admitting: Anesthesiology

## 2011-03-05 ENCOUNTER — Encounter (HOSPITAL_COMMUNITY): Payer: Self-pay | Admitting: Internal Medicine

## 2011-03-05 ENCOUNTER — Inpatient Hospital Stay (HOSPITAL_COMMUNITY): Payer: BC Managed Care – PPO

## 2011-03-05 ENCOUNTER — Other Ambulatory Visit: Payer: Self-pay

## 2011-03-05 DIAGNOSIS — E872 Acidosis: Secondary | ICD-10-CM | POA: Diagnosis present

## 2011-03-05 DIAGNOSIS — N1330 Unspecified hydronephrosis: Secondary | ICD-10-CM | POA: Diagnosis present

## 2011-03-05 DIAGNOSIS — D72829 Elevated white blood cell count, unspecified: Secondary | ICD-10-CM | POA: Diagnosis present

## 2011-03-05 DIAGNOSIS — N186 End stage renal disease: Secondary | ICD-10-CM | POA: Diagnosis present

## 2011-03-05 DIAGNOSIS — N390 Urinary tract infection, site not specified: Secondary | ICD-10-CM | POA: Diagnosis present

## 2011-03-05 DIAGNOSIS — Z992 Dependence on renal dialysis: Secondary | ICD-10-CM | POA: Diagnosis present

## 2011-03-05 HISTORY — PX: AV FISTULA PLACEMENT: SHX1204

## 2011-03-05 HISTORY — PX: INSERTION OF DIALYSIS CATHETER: SHX1324

## 2011-03-05 HISTORY — DX: Chronic kidney disease, unspecified: N18.9

## 2011-03-05 LAB — COMPREHENSIVE METABOLIC PANEL
ALT: 24 U/L (ref 0–35)
AST: 17 U/L (ref 0–37)
Albumin: 3 g/dL — ABNORMAL LOW (ref 3.5–5.2)
Alkaline Phosphatase: 96 U/L (ref 39–117)
BUN: 211 mg/dL — ABNORMAL HIGH (ref 6–23)
CO2: 12 mEq/L — ABNORMAL LOW (ref 19–32)
Calcium: 10.6 mg/dL — ABNORMAL HIGH (ref 8.4–10.5)
Chloride: 86 mEq/L — ABNORMAL LOW (ref 96–112)
Creatinine, Ser: 15.62 mg/dL — ABNORMAL HIGH (ref 0.50–1.10)
GFR calc Af Amer: 3 mL/min — ABNORMAL LOW (ref >90)
GFR calc non Af Amer: 2 mL/min — ABNORMAL LOW (ref >90)
Glucose, Bld: 116 mg/dL — ABNORMAL HIGH (ref 70–99)
Potassium: 3.3 mEq/L — ABNORMAL LOW (ref 3.5–5.1)
Sodium: 132 mEq/L — ABNORMAL LOW (ref 135–145)
Total Bilirubin: 0.2 mg/dL — ABNORMAL LOW (ref 0.3–1.2)
Total Protein: 9.1 g/dL — ABNORMAL HIGH (ref 6.0–8.3)

## 2011-03-05 LAB — IRON AND TIBC
Iron: 73 ug/dL (ref 42–135)
Saturation Ratios: 39 % (ref 20–55)
TIBC: 189 ug/dL — ABNORMAL LOW (ref 250–470)
UIBC: 116 ug/dL — ABNORMAL LOW (ref 125–400)

## 2011-03-05 LAB — CBC
HCT: 23.3 % — ABNORMAL LOW (ref 36.0–46.0)
Hemoglobin: 8.3 g/dL — ABNORMAL LOW (ref 12.0–15.0)
MCH: 31.1 pg (ref 26.0–34.0)
MCHC: 35.6 g/dL (ref 30.0–36.0)
MCV: 87.3 fL (ref 78.0–100.0)
Platelets: 731 K/uL — ABNORMAL HIGH (ref 150–400)
RBC: 2.67 MIL/uL — ABNORMAL LOW (ref 3.87–5.11)
RDW: 14.8 % (ref 11.5–15.5)
WBC: 27.7 K/uL — ABNORMAL HIGH (ref 4.0–10.5)

## 2011-03-05 LAB — URINE CULTURE
Colony Count: NO GROWTH
Culture  Setup Time: 201212200406
Culture: NO GROWTH

## 2011-03-05 LAB — FERRITIN: Ferritin: 1188 ng/mL — ABNORMAL HIGH (ref 10–291)

## 2011-03-05 LAB — TSH: TSH: 2.447 u[IU]/mL (ref 0.350–4.500)

## 2011-03-05 LAB — MRSA PCR SCREENING: MRSA by PCR: NEGATIVE

## 2011-03-05 LAB — HEPATITIS B SURFACE ANTIGEN: Hepatitis B Surface Ag: NEGATIVE

## 2011-03-05 LAB — ALT: ALT: 17 U/L (ref 0–35)

## 2011-03-05 LAB — HEPATITIS B SURFACE ANTIBODY,QUALITATIVE: Hep B S Ab: NEGATIVE

## 2011-03-05 LAB — PROTIME-INR
INR: 1.27 (ref 0.00–1.49)
Prothrombin Time: 16.2 s — ABNORMAL HIGH (ref 11.6–15.2)

## 2011-03-05 LAB — PHOSPHORUS: Phosphorus: 9.3 mg/dL — ABNORMAL HIGH (ref 2.3–4.6)

## 2011-03-05 IMAGING — CR DG CHEST 1V
1 series · 1 of 1 positions shown · non-contrast
Comparison: Portable chest x-ray of [DATE]

CLINICAL DATA: Post dialysis catheter placement

CHEST - 1 VIEW

[AP]
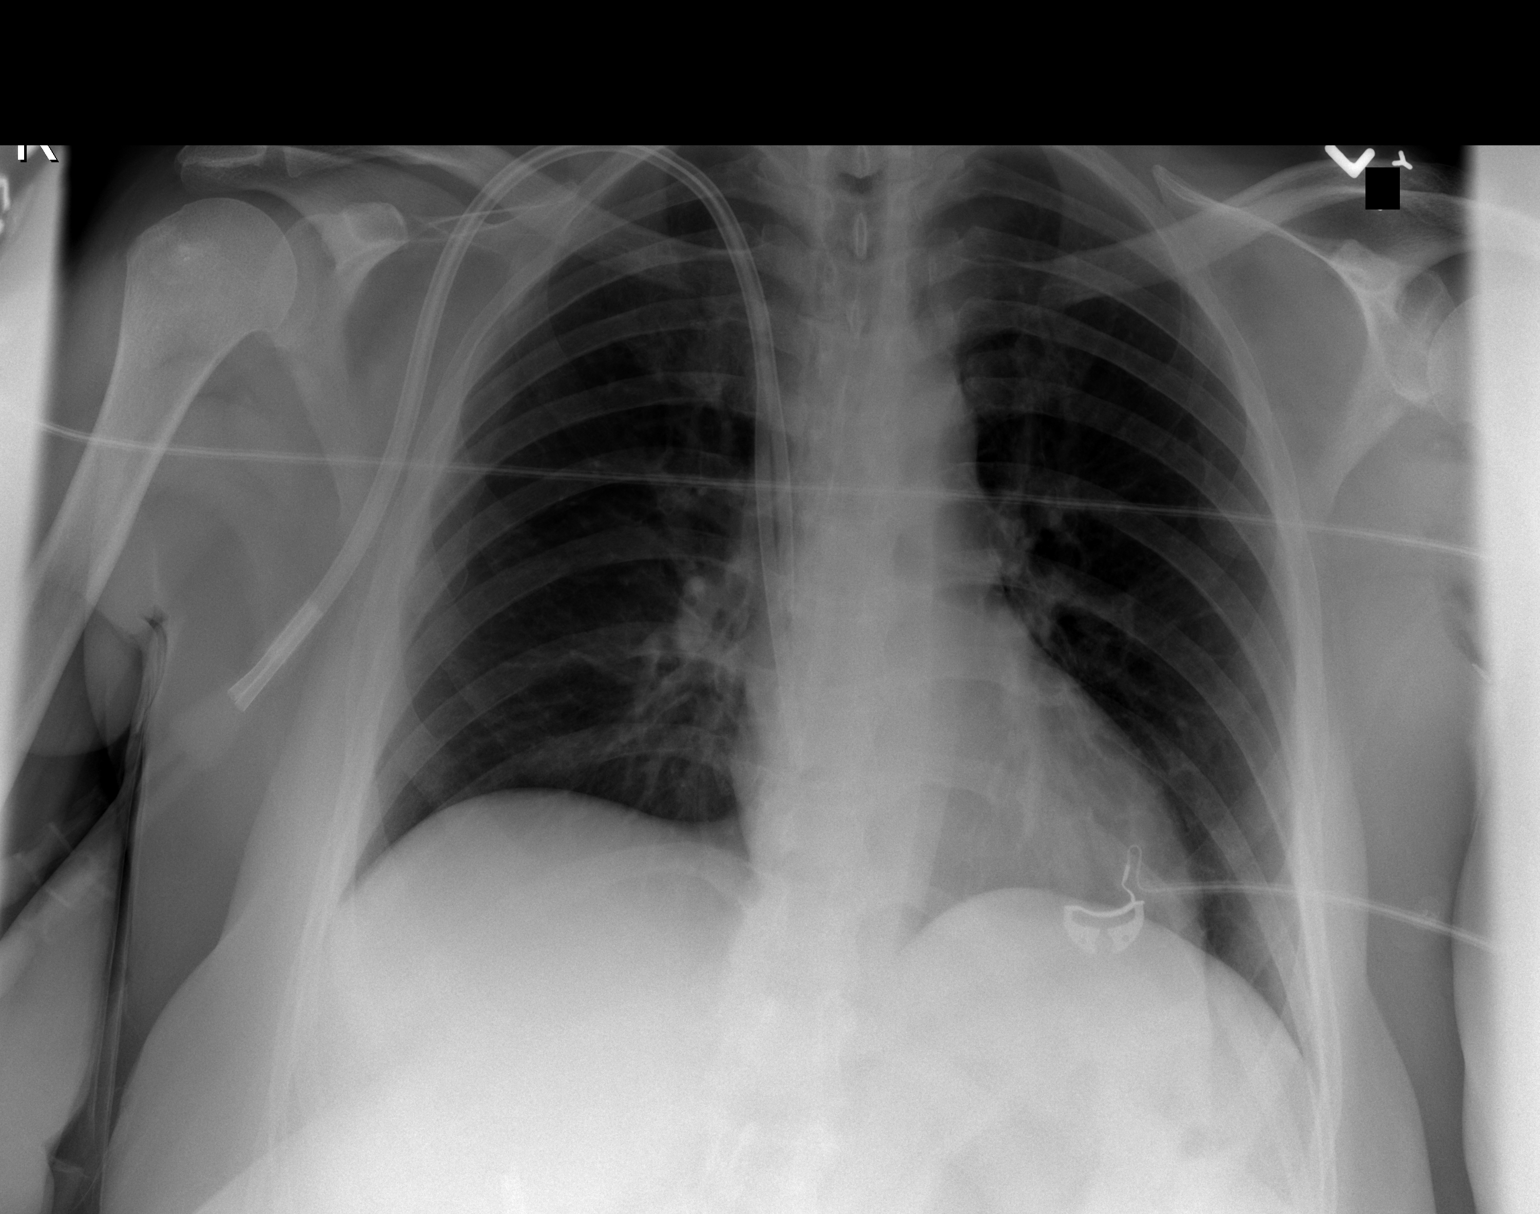

[1 of 1 positions shown; findings below may reference images not displayed]

FINDINGS: A right-sided dialysis catheter is present with the tips
at the expected SVC - RA junction.  No pneumothorax is seen.  The
lungs are clear.  The heart is within normal limits in size.
Mediastinal contours are stable.
IMPRESSION: Dialysis catheter tips at SVC/RA junction.  No pneumothorax.

## 2011-03-05 SURGERY — INSERTION OF DIALYSIS CATHETER
Anesthesia: Monitor Anesthesia Care | Site: Neck | Laterality: Right | Wound class: Clean

## 2011-03-05 MED ORDER — SODIUM CHLORIDE 0.9 % IR SOLN
Status: DC | PRN
  Administered 2011-03-05: 12:00:00

## 2011-03-05 MED ORDER — ZOLPIDEM TARTRATE 5 MG PO TABS
5.0000 mg | ORAL_TABLET | Freq: Every evening | ORAL | Status: DC | PRN
  Administered 2011-03-05: 5 mg via ORAL
  Filled 2011-03-05: qty 1

## 2011-03-05 MED ORDER — THROMBIN 20000 UNITS EX KIT
PACK | CUTANEOUS | Status: DC | PRN
  Administered 2011-03-05: 12:00:00 via TOPICAL

## 2011-03-05 MED ORDER — HEPARIN SODIUM (PORCINE) 1000 UNIT/ML DIALYSIS
1000.0000 [IU] | INTRAMUSCULAR | Status: DC | PRN
  Filled 2011-03-05: qty 1

## 2011-03-05 MED ORDER — SODIUM CHLORIDE 0.9 % IV SOLN
INTRAVENOUS | Status: DC
  Administered 2011-03-05: 03:00:00 via INTRAVENOUS

## 2011-03-05 MED ORDER — SODIUM CHLORIDE 0.9 % IV SOLN: 100.0000 mL | INTRAVENOUS | Status: DC | PRN

## 2011-03-05 MED ORDER — HEMOSTATIC AGENTS (NO CHARGE) OPTIME: TOPICAL | Status: DC | PRN

## 2011-03-05 MED ORDER — LIDOCAINE-PRILOCAINE 2.5-2.5 % EX CREA: 1.0000 "application " | TOPICAL_CREAM | CUTANEOUS | Status: DC | PRN

## 2011-03-05 MED ORDER — CALCIUM CARBONATE 1250 MG/5ML PO SUSP
500.0000 mg | Freq: Four times a day (QID) | ORAL | Status: DC | PRN
  Filled 2011-03-05: qty 5

## 2011-03-05 MED ORDER — SODIUM CHLORIDE 0.9 % IV SOLN
INTRAVENOUS | Status: DC | PRN
  Administered 2011-03-05: 10:00:00 via INTRAVENOUS

## 2011-03-05 MED ORDER — NEPRO/CARBSTEADY PO LIQD: 237.0000 mL | Freq: Three times a day (TID) | ORAL | Status: DC | PRN

## 2011-03-05 MED ORDER — FENTANYL CITRATE 0.05 MG/ML IJ SOLN
INTRAMUSCULAR | Status: DC | PRN
  Administered 2011-03-05: 50 ug via INTRAVENOUS

## 2011-03-05 MED ORDER — DEXTROSE 5 % IV SOLN
1.0000 g | Freq: Once | INTRAVENOUS | Status: AC
  Administered 2011-03-05: 1 g via INTRAVENOUS
  Filled 2011-03-05: qty 10

## 2011-03-05 MED ORDER — RENA-VITE PO TABS
1.0000 | ORAL_TABLET | Freq: Every day | ORAL | Status: DC
  Administered 2011-03-06 – 2011-03-15 (×9): 1 via ORAL
  Filled 2011-03-05 (×10): qty 1

## 2011-03-05 MED ORDER — PENTAFLUOROPROP-TETRAFLUOROETH EX AERO: 1.0000 "application " | INHALATION_SPRAY | CUTANEOUS | Status: DC | PRN

## 2011-03-05 MED ORDER — ACETAMINOPHEN 325 MG PO TABS
650.0000 mg | ORAL_TABLET | Freq: Four times a day (QID) | ORAL | Status: DC | PRN
Start: 2011-03-05 — End: 2011-03-15
  Administered 2011-03-05: 650 mg via ORAL

## 2011-03-05 MED ORDER — ONDANSETRON HCL 4 MG PO TABS: 4.0000 mg | ORAL_TABLET | Freq: Four times a day (QID) | ORAL | Status: DC | PRN

## 2011-03-05 MED ORDER — HEPARIN SODIUM (PORCINE) 1000 UNIT/ML DIALYSIS
40.0000 [IU]/kg | Freq: Once | INTRAMUSCULAR | Status: AC
  Administered 2011-03-05: 3000 [IU] via INTRAVENOUS_CENTRAL
  Filled 2011-03-05: qty 3

## 2011-03-05 MED ORDER — ONDANSETRON HCL 4 MG PO TABS
4.0000 mg | ORAL_TABLET | Freq: Four times a day (QID) | ORAL | Status: DC | PRN
  Filled 2011-03-05: qty 1

## 2011-03-05 MED ORDER — ACETAMINOPHEN 325 MG PO TABS
650.0000 mg | ORAL_TABLET | Freq: Four times a day (QID) | ORAL | Status: DC | PRN
  Filled 2011-03-05: qty 2

## 2011-03-05 MED ORDER — ACETAMINOPHEN 650 MG RE SUPP: 650.0000 mg | Freq: Four times a day (QID) | RECTAL | Status: DC | PRN

## 2011-03-05 MED ORDER — HYDROXYZINE HCL 25 MG PO TABS
25.0000 mg | ORAL_TABLET | Freq: Three times a day (TID) | ORAL | Status: DC | PRN
  Filled 2011-03-05: qty 1

## 2011-03-05 MED ORDER — OXYCODONE HCL 5 MG PO TABS
5.0000 mg | ORAL_TABLET | Freq: Three times a day (TID) | ORAL | Status: DC | PRN
  Administered 2011-03-05 (×2): 5 mg via ORAL
  Filled 2011-03-05: qty 1

## 2011-03-05 MED ORDER — DARBEPOETIN ALFA-POLYSORBATE 200 MCG/0.4ML IJ SOLN
INTRAMUSCULAR | Status: AC
  Filled 2011-03-05: qty 0.4

## 2011-03-05 MED ORDER — FENTANYL CITRATE 0.05 MG/ML IJ SOLN: 25.0000 ug | INTRAMUSCULAR | Status: DC | PRN

## 2011-03-05 MED ORDER — LIDOCAINE HCL (PF) 1 % IJ SOLN
5.0000 mL | INTRAMUSCULAR | Status: DC | PRN
  Filled 2011-03-05: qty 5

## 2011-03-05 MED ORDER — SODIUM CHLORIDE 0.9 % IR SOLN
Status: DC | PRN
  Administered 2011-03-05: 1000 mL

## 2011-03-05 MED ORDER — MIDAZOLAM HCL 5 MG/5ML IJ SOLN
INTRAMUSCULAR | Status: DC | PRN
  Administered 2011-03-05: 2 mg via INTRAVENOUS

## 2011-03-05 MED ORDER — INFLUENZA VIRUS VACC SPLIT PF IM SUSP
0.5000 mL | INTRAMUSCULAR | Status: AC
  Administered 2011-03-06: 0.5 mL via INTRAMUSCULAR
  Filled 2011-03-05: qty 0.5

## 2011-03-05 MED ORDER — SORBITOL 70 % SOLN
30.0000 mL | Status: DC | PRN
  Filled 2011-03-05: qty 30

## 2011-03-05 MED ORDER — OXYCODONE HCL 5 MG PO TABS
ORAL_TABLET | ORAL | Status: AC
  Administered 2011-03-06: 5 mg via ORAL
  Filled 2011-03-05: qty 1

## 2011-03-05 MED ORDER — DARBEPOETIN ALFA-POLYSORBATE 200 MCG/0.4ML IJ SOLN
200.0000 ug | INTRAMUSCULAR | Status: DC
  Administered 2011-03-05 – 2011-03-12 (×2): 200 ug via INTRAVENOUS
  Filled 2011-03-05: qty 0.4

## 2011-03-05 MED ORDER — HYDRALAZINE HCL 20 MG/ML IJ SOLN
5.0000 mg | Freq: Three times a day (TID) | INTRAMUSCULAR | Status: DC | PRN
  Administered 2011-03-05: 5 mg via INTRAVENOUS
  Filled 2011-03-05: qty 0.25

## 2011-03-05 MED ORDER — PROPOFOL 10 MG/ML IV EMUL
INTRAVENOUS | Status: DC | PRN
  Administered 2011-03-05: 75 ug/kg/min via INTRAVENOUS

## 2011-03-05 MED ORDER — METOCLOPRAMIDE HCL 5 MG/ML IJ SOLN
10.0000 mg | Freq: Once | INTRAMUSCULAR | Status: DC | PRN
  Filled 2011-03-05: qty 2

## 2011-03-05 MED ORDER — PROTAMINE SULFATE 10 MG/ML IV SOLN
INTRAVENOUS | Status: DC | PRN
  Administered 2011-03-05: 10 mg via INTRAVENOUS
  Administered 2011-03-05: 20 mg via INTRAVENOUS

## 2011-03-05 MED ORDER — ALTEPLASE 2 MG IJ SOLR
2.0000 mg | Freq: Once | INTRAMUSCULAR | Status: AC | PRN
  Filled 2011-03-05: qty 2

## 2011-03-05 MED ORDER — ONDANSETRON HCL 4 MG/2ML IJ SOLN: 4.0000 mg | Freq: Four times a day (QID) | INTRAMUSCULAR | Status: DC | PRN

## 2011-03-05 MED ORDER — DOCUSATE SODIUM 283 MG RE ENEM
1.0000 | ENEMA | RECTAL | Status: DC | PRN
  Filled 2011-03-05: qty 1

## 2011-03-05 MED ORDER — HEPARIN SODIUM (PORCINE) 1000 UNIT/ML IJ SOLN
INTRAMUSCULAR | Status: DC | PRN
  Administered 2011-03-05: 1000 [IU]

## 2011-03-05 MED ORDER — ACETAMINOPHEN 650 MG RE SUPP
650.0000 mg | Freq: Four times a day (QID) | RECTAL | Status: DC | PRN
  Filled 2011-03-05: qty 1

## 2011-03-05 MED ORDER — LIDOCAINE-EPINEPHRINE (PF) 1 %-1:200000 IJ SOLN
INTRAMUSCULAR | Status: DC | PRN
  Administered 2011-03-05: 30 mL

## 2011-03-05 MED ORDER — LIDOCAINE HCL (PF) 1 % IJ SOLN
INTRAMUSCULAR | Status: DC | PRN
  Administered 2011-03-05: 19 mL

## 2011-03-05 MED ORDER — CAMPHOR-MENTHOL 0.5-0.5 % EX LOTN: 1.0000 "application " | TOPICAL_LOTION | Freq: Three times a day (TID) | CUTANEOUS | Status: DC | PRN

## 2011-03-05 MED ORDER — ONDANSETRON HCL 4 MG/2ML IJ SOLN
4.0000 mg | Freq: Four times a day (QID) | INTRAMUSCULAR | Status: DC | PRN
  Administered 2011-03-06: 4 mg via INTRAVENOUS
  Filled 2011-03-05 (×2): qty 2

## 2011-03-05 MED ORDER — NEPRO/CARBSTEADY PO LIQD
237.0000 mL | ORAL | Status: DC | PRN
  Filled 2011-03-05: qty 237

## 2011-03-05 SURGICAL SUPPLY — 58 items
ADH SKN CLS APL DERMABOND .7 (GAUZE/BANDAGES/DRESSINGS) ×3
BAG DECANTER FOR FLEXI CONT (MISCELLANEOUS) ×2 IMPLANT
CANISTER SUCTION 2500CC (MISCELLANEOUS) ×2 IMPLANT
CATH CANNON HEMO 15F 50CM (CATHETERS) IMPLANT
CATH CANNON HEMO 15FR 19 (HEMODIALYSIS SUPPLIES) IMPLANT
CATH CANNON HEMO 15FR 23CM (HEMODIALYSIS SUPPLIES) ×2 IMPLANT
CATH CANNON HEMO 15FR 31CM (HEMODIALYSIS SUPPLIES) IMPLANT
CATH CANNON HEMO 15FR 32 (HEMODIALYSIS SUPPLIES) IMPLANT
CATH CANNON HEMO 15FR 32CM (HEMODIALYSIS SUPPLIES) IMPLANT
CLIP TI MEDIUM 6 (CLIP) ×2 IMPLANT
CLIP TI WIDE RED SMALL 6 (CLIP) ×4 IMPLANT
CLOTH BEACON ORANGE TIMEOUT ST (SAFETY) ×4 IMPLANT
COVER PROBE W GEL 5X96 (DRAPES) ×2 IMPLANT
COVER SURGICAL LIGHT HANDLE (MISCELLANEOUS) ×6 IMPLANT
DERMABOND ADVANCED (GAUZE/BANDAGES/DRESSINGS) ×1
DERMABOND ADVANCED .7 DNX12 (GAUZE/BANDAGES/DRESSINGS) ×1 IMPLANT
DRAPE C-ARM 42X72 X-RAY (DRAPES) ×4 IMPLANT
DRAPE CHEST BREAST 15X10 FENES (DRAPES) ×4 IMPLANT
GAUZE SPONGE 2X2 8PLY STRL LF (GAUZE/BANDAGES/DRESSINGS) ×3 IMPLANT
GAUZE SPONGE 4X4 16PLY XRAY LF (GAUZE/BANDAGES/DRESSINGS) ×6 IMPLANT
GLOVE BIO SURGEON STRL SZ 6.5 (GLOVE) ×4 IMPLANT
GLOVE BIO SURGEON STRL SZ7.5 (GLOVE) ×12 IMPLANT
GLOVE BIOGEL PI IND STRL 6.5 (GLOVE) ×4 IMPLANT
GLOVE BIOGEL PI IND STRL 7.5 (GLOVE) ×7 IMPLANT
GLOVE BIOGEL PI INDICATOR 6.5 (GLOVE) ×4
GLOVE BIOGEL PI INDICATOR 7.5 (GLOVE) ×5
GLOVE ECLIPSE 6.0 STRL STRAW (GLOVE) ×2 IMPLANT
GOWN PREVENTION PLUS XLARGE (GOWN DISPOSABLE) ×4 IMPLANT
GOWN STRL NON-REIN LRG LVL3 (GOWN DISPOSABLE) ×10 IMPLANT
GRAFT GORETEX STRT 4-7X45 (Vascular Products) ×2 IMPLANT
KIT BASIN OR (CUSTOM PROCEDURE TRAY) ×4 IMPLANT
KIT ROOM TURNOVER OR (KITS) ×4 IMPLANT
NDL 18GX1X1/2 (RX/OR ONLY) (NEEDLE) ×2 IMPLANT
NDL HYPO 25GX1X1/2 BEV (NEEDLE) ×2 IMPLANT
NEEDLE 18GX1X1/2 (RX/OR ONLY) (NEEDLE) ×4 IMPLANT
NEEDLE 22X1 1/2 (OR ONLY) (NEEDLE) ×4 IMPLANT
NEEDLE HYPO 25GX1X1/2 BEV (NEEDLE) ×4 IMPLANT
NS IRRIG 1000ML POUR BTL (IV SOLUTION) ×6 IMPLANT
PACK CV ACCESS (CUSTOM PROCEDURE TRAY) ×2 IMPLANT
PACK SURGICAL SETUP 50X90 (CUSTOM PROCEDURE TRAY) ×2 IMPLANT
PAD ARMBOARD 7.5X6 YLW CONV (MISCELLANEOUS) ×10 IMPLANT
SOAP 2 % CHG 4 OZ (WOUND CARE) ×4 IMPLANT
SPONGE GAUZE 2X2 STER 10/PKG (GAUZE/BANDAGES/DRESSINGS) ×1
SPONGE GAUZE 4X4 12PLY (GAUZE/BANDAGES/DRESSINGS) ×2 IMPLANT
SUT ETHILON 3 0 PS 1 (SUTURE) ×4 IMPLANT
SUT PROLENE 6 0 BV (SUTURE) ×6 IMPLANT
SUT VIC AB 3-0 SH 27 (SUTURE) ×4
SUT VIC AB 3-0 SH 27X BRD (SUTURE) ×1 IMPLANT
SUT VICRYL 4-0 PS2 18IN ABS (SUTURE) ×8 IMPLANT
SYR 20CC LL (SYRINGE) ×6 IMPLANT
SYR 30ML LL (SYRINGE) IMPLANT
SYR 5ML LL (SYRINGE) ×8 IMPLANT
SYR CONTROL 10ML LL (SYRINGE) ×2 IMPLANT
SYRINGE 10CC LL (SYRINGE) ×4 IMPLANT
TOWEL OR 17X24 6PK STRL BLUE (TOWEL DISPOSABLE) ×4 IMPLANT
TOWEL OR 17X26 10 PK STRL BLUE (TOWEL DISPOSABLE) ×4 IMPLANT
UNDERPAD 30X30 INCONTINENT (UNDERPADS AND DIAPERS) ×2 IMPLANT
WATER STERILE IRR 1000ML POUR (IV SOLUTION) ×6 IMPLANT

## 2011-03-05 NOTE — Progress Notes (Signed)
I have examined the patient and reviewed the chart. I agree with the above note.   Debbe Odea MD [phone]

## 2011-03-05 NOTE — ED Notes (Signed)
Report called pt. Transferred to admit bed with RN and cardiac Monitor, NAD noted, pt. Alert and orierted

## 2011-03-05 NOTE — Consult Note (Signed)
Name: KRISHA FANTAUZZI MRN: [id number] DOB: 20-Jun-1955  LOS:   CRITICAL CARE CONSULT NOTE  History of Present Illness: 55 y/o female with CKD followed by Dr. Justin Mend as an outpatient who was instructed by Dr. Justin Mend to come to the ED this evening for markedly abnormal labs and to initiate hemodialysis during this admission.  The patient states that she has been feeling well recently, no fevers, chills, nausea, or vomiting. She has had some hiccups in the last 24 hours, but no abdominal pain.  She has noticed some fatigue in the last few weeks and becomes short of breath on exertion.  She has otherwise noted no resting dyspnea, swelling, or orthopnea.  No recent cough, dysuria, diarrhea.  She states that she was surprised to hear that her labs had progressed so quickly.  Her urine output has been consistent in the last few weeks.  We were called to consider ICU placement because her serum bicarb was 8.  Lines / Drains:   Cultures / Sepsis markers:   Antibiotics:   Tests / Events:      Past Medical History  Diagnosis Date  . Chronic kidney disease   . Thyroid disorder   . Cancer     cervical ca 2006   Past Surgical History  Procedure Date  . Ureter surgery     stent  . Tubal ligation    Prior to Admission medications   Medication Sig Start Date End Date Taking? Authorizing Provider  multivitamin (RENA-VIT) TABS tablet Take 1 tablet by mouth daily.      Historical Provider, MD  paricalcitol (ZEMPLAR) 1 MCG capsule Take 1 mcg by mouth daily.      Historical Provider, MD   Allergies  Allergen Reactions  . Prozac (Fluoxetine Hcl) Hives   Family History  Problem Relation Age of Onset  . Lung cancer Father    Social History  reports that she has never smoked. She does not have any smokeless tobacco history on file. She reports that she does not drink alcohol or use illicit drugs.  Review Of Systems   Gen: Denies fever, chills, weight change, + fatigue, no night sweats HEENT:  Denies blurred vision, double vision, hearing loss, tinnitus, sinus congestion, rhinorrhea, sore throat, neck stiffness, dysphagia PULM: Denies shortness of breath, cough, sputum production, hemoptysis, wheezing CV: Denies chest pain, edema, orthopnea, paroxysmal nocturnal dyspnea, palpitations GI: Denies abdominal pain, nausea, vomiting, diarrhea, hematochezia, melena, constipation, change in bowel habits GU: Denies dysuria, hematuria, polyuria, oliguria, urethral discharge Endocrine: Denies hot or cold intolerance, polyuria, polyphagia or appetite change Derm: Denies rash, dry skin, scaling or peeling skin change Heme: Denies easy bruising, bleeding, bleeding gums Neuro: Denies headache, numbness, weakness, slurred speech, loss of memory or consciousness  Vital Signs:   97.9 HR 56 BP 131/81 RR 18, O2 99% RA  Physical Examination: Gen: well appearing, no acute distress HEENT: NCAT, PERRL, EOMi, OP clear, MMM Neck: supple without masses PULM: CTA B CV: RRR, no mgr, no JVD AB: BS+, soft, nontender, no hsm Ext: warm, no edema, no clubbing, no cyanosis Derm: no rash or skin breakdown Neuro: A&Ox4, CN II-XII intact, strength 5/5 in all 4 extremities Psyche: Normal mood and affect  Labs and Imaging:    BMET    Component Value Date/Time   NA 127* 03/04/2011 1946   K 3.9 03/04/2011 1946   CL 85* 03/04/2011 1946   CO2 9* 03/04/2011 1946   GLUCOSE 100* 03/04/2011 1946   BUN 211* 03/04/2011  1946   CREATININE 16.44* 03/04/2011 1946   CALCIUM 11.1* 03/04/2011 1946   GFRNONAA 2* 03/04/2011 1946   GFRAA 2* 03/04/2011 1946    CBC    Component Value Date/Time   WBC 31.1* 03/04/2011 1946   RBC 2.80* 03/04/2011 1946   HGB 8.8* 03/04/2011 1946   HCT 24.6* 03/04/2011 1946   PLT 741* 03/04/2011 1946   MCV 87.9 03/04/2011 1946   MCH 31.4 03/04/2011 1946   MCHC 35.8 03/04/2011 1946   RDW 14.8 03/04/2011 1946   LYMPHSABS 2.5 03/04/2011 1946   MONOABS 1.9* 03/04/2011 1946   EOSABS 0.0  03/04/2011 1946   BASOSABS 0.0 03/04/2011 1946    Impression: 1) CKD, now ESRD 2) Metabolic acidosis 3) Uremia 4) hypertension  Assessment and Plan:  55 y/o female with CKD, now ESRD based on labs who has a BUN >200 and low serum bicarb.  She is currently compensating for her metabolic acidosis from renal failure quite well but needs dialysis in the morning.  At this point she is not volume overloaded, hyperkalemic and does not need dialysis at this time.  Does not need ICU placement at this point.  We agree with step down admission on [facility] hospitalist service. At this point the plan is for her to have an AV fistula and perm cath placed in the OR tomorrow and to start dialysis afterwards.  We agree with plans to give a bicarbonate drip tonight.  We will be available to see her on 03/05/11 and can place hemodialysis access tomorrow if her permcath placement is put on hold for some reason.  Roselie Awkward, M.D. Pulmonary and [place] Pager: [phone]

## 2011-03-05 NOTE — Transfer of Care (Signed)
Immediate Anesthesia Transfer of Care Note  Patient: CHANIYAH VANDONGEN  Procedure(s) Performed:  INSERTION OF DIALYSIS CATHETER - start [id number]- finish 1051; INSERTION OF ARTERIOVENOUS (AV) GORE-TEX GRAFT ARM - start 1115   finish 1250  Patient Location: PACU  Anesthesia Type: MAC  Level of Consciousness: awake and alert   Airway & Oxygen Therapy: Patient Spontanous Breathing and Patient connected to nasal cannula oxygen  Post-op Assessment: Report given to PACU RN and Patient moving all extremities X 4  Post vital signs: Reviewed and stable  Complications: No apparent anesthesia complications

## 2011-03-05 NOTE — Anesthesia Postprocedure Evaluation (Signed)
  Anesthesia Post-op Note  Patient: Mary Wilcox  Procedure(s) Performed:  INSERTION OF DIALYSIS CATHETER - start [id number]- finish 1051; INSERTION OF ARTERIOVENOUS (AV) GORE-TEX GRAFT ARM - start 1115   finish 1250  Patient Location: PACU  Anesthesia Type: MAC  Level of Consciousness: awake and alert   Airway and Oxygen Therapy: Patient Spontanous Breathing and Patient connected to nasal cannula oxygen  Post-op Pain: mild  Post-op Assessment: Post-op Vital signs reviewed, [name] Cardiovascular Status Stable, Respiratory Function Stable, Patent Airway, No signs of Nausea or vomiting and Pain level controlled  Post-op Vital Signs: Reviewed and stable  Complications: No apparent anesthesia complications

## 2011-03-05 NOTE — Anesthesia Preprocedure Evaluation (Addendum)
Anesthesia Evaluation  Patient identified by MRN, date of birth, ID band Patient awake    Reviewed: Allergy & Precautions, H&P , NPO status , [name] Chart, lab work & pertinent test results, reviewed documented beta blocker date and time   Airway Mallampati: II TM Distance: >3 FB Neck ROM: full    Dental  (+) Dental Advidsory Given   Pulmonary neg pulmonary ROS,          Cardiovascular neg cardio ROS     Neuro/Psych Negative Neurological ROS  Negative Psych ROS   GI/Hepatic negative GI ROS, Neg liver ROS,   Endo/Other  Negative Endocrine ROS  Renal/GU CRF and ARF  Genitourinary negative   Musculoskeletal   Abdominal   Peds  Hematology negative hematology ROS (+)   Anesthesia Other Findings See surgeon's H&P   Reproductive/Obstetrics negative OB ROS                          Anesthesia Physical Anesthesia Plan  ASA: III  Anesthesia Plan: MAC   Post-op Pain Management:    Induction:   Airway Management Planned: Simple Face Mask  Additional Equipment:   Intra-op Plan:   Post-operative Plan:   Informed Consent: I have reviewed the patients History and Physical, chart, labs and discussed the procedure including the risks, benefits and alternatives for the proposed anesthesia with the patient or authorized representative who has indicated his/her understanding and acceptance.     Plan Discussed with: CRNA and Surgeon  Anesthesia Plan Comments:         Anesthesia Quick Evaluation

## 2011-03-05 NOTE — Progress Notes (Signed)
   CARE MANAGEMENT NOTE 03/05/2011  Patient:  Mary Wilcox, Mary Wilcox   Account Number:  [id number]  Date Initiated:  03/05/2011  Documentation initiated by:  Lars Pinks  Subjective/Objective Assessment:   PT WAS ADMITTED WITH CREAT OF 16 AND BICARD OF 7     Action/Plan:   PROGRESSION OF CARE AND DISCHARGE PLANNING   Anticipated DC Date:  03/09/2011   Anticipated DC Plan:  [facility]  CM consult      Choice offered to / List presented to:             Status of service:  In process, will continue to follow Medicare Important Message given?   (If response is "NO", the following Medicare IM given date fields will be blank) Date Medicare IM given:   Date Additional Medicare IM given:    Discharge Disposition:    Per UR Regulation:  Reviewed for med. necessity/level of care/duration of stay  Comments:  UR COMPLETED 03/05/2011 Gaudencio Chesnut, RN,BSN [id number] PT WAS ADMITTED AND PLACED ON BICARB GTT, WILL F/U ON DC NEEDS

## 2011-03-05 NOTE — Preoperative (Signed)
Beta Blockers   Reason not to administer Beta Blockers:Not Applicable 

## 2011-03-05 NOTE — Progress Notes (Signed)
Subjective: Flat affect. Denies any complaints such as chest pain, shortness of breath, orthopnea cough,fevers or chills. She did endorse she was continuing to make urine at home. Transporter at bedside awaiting patient.  Objective: Vital signs in last 24 hours: Temp:  [97.5 F (36.4 C)-97.9 F (36.6 C)] 97.6 F (36.4 C) (12/20 0800) Pulse Rate:  [56-84] 65  (12/20 0800) Resp:  [15-24] 16  (12/20 0800) BP: (113-191)/(63-109) 139/65 mmHg (12/20 0800) SpO2:  [98 %-100 %] 99 % (12/20 0800) Weight:  [75.5 kg (166 lb 7.2 oz)-76 kg (167 lb 8.8 oz)] 167 lb 8.8 oz (76 kg) (12/20 0500) Weight change:  Last BM Date: 03/05/11  Intake/Output from previous day: 12/19 0701 - 12/20 0700 In: 440 [I.V.:440] Out: 50 [Urine:50] Intake/Output this shift: Total I/O In: 200 [I.V.:200] Out: 0   General appearance: cooperative, appears older than stated age, no distress and slowed mentation, uremic-appearing Resp: clear to auscultation bilaterally, she is on room air maintaining oxygen saturations between 99 and 100%, respiratory effort is nonlabored nontachypneic. Cardio: Normal sinus rhythm with regular rate and rhythm, S1, S2 normal, no murmur, click, rub or gallop GI: soft, non-tender; bowel sounds normal; no masses,  no organomegaly Extremities: extremities normal, atraumatic, no cyanosis or edema Neurologic: Grossly normal but with an extremely flat affect, exam is otherwise nonfocal, patient is oriented x4.  Lab Results:  Basename 03/05/11 0500 03/04/11 1946  WBC 27.7* 31.1*  HGB 8.3* 8.8*  HCT 23.3* 24.6*  PLT 731* 741*   BMET  Basename 03/05/11 0500 03/04/11 1946  NA 132* 127*  K 3.3* 3.9  CL 86* 85*  CO2 12* 9*  GLUCOSE 116* 100*  BUN 211* 211*  CREATININE 15.62* 16.44*  CALCIUM 10.6* 11.1*    Studies/Results: Dg Chest Port 1 View  03/04/2011  *RADIOLOGY REPORT*  Clinical Data: Shortness of breath, elevated white count  PORTABLE CHEST - 1 VIEW  Comparison: [facility] chest  radiograph dated 07/25/2004  Findings: Lungs are clear.  No pleural effusion or pneumothorax.  Cardiomediastinal silhouette is within normal limits.  IMPRESSION: No evidence of acute cardiopulmonary disease.  Original Report Authenticated By: Julian Hy, M.D.    Medications:  I have reviewed the patient's current medications. Scheduled:   . cefTRIAXone (ROCEPHIN)  IV  1 g Intravenous Once  . darbepoetin (ARANESP) injection - DIALYSIS  200 mcg Intravenous Q Thu-HD  . heparin  40 Units/kg Dialysis Once in dialysis  . influenza  inactive virus vaccine  0.5 mL Intramuscular Tomorrow-1000  . multivitamin  1 tablet Oral Daily    Assessment/Plan:  Principal Problem:  *Chronic kidney disease, stage V requiring chronic dialysis/Metabolic acidosis At admission this patient endorsed that her baseline creatinine was 7.0 but unfortunately at presentation her creatinine had increased to 16.43 her pH on the blood gas was 7.26 with a compensatory hypocarbia of 17.5, her bicarbonate was low at 7.9 she had a significant base deficit of 17 all of this consistent with renal mediated acute on chronic metabolic acidosis. She was started on a bicarbonate drip by the admitting physician but this is only a temporizing measure until patient can undergo hemodialysis treatments. Fortunately she does not have associated hyperkalemia. Dr. Posey Pronto has seen this patient in consultation for the renal service. She is to undergo placement of a Diatek catheter today and begin dialysis treatments urgently on 03/05/2011.  I have reviewed the nephrologist consultation note in the etiology of this patient's chronic kidney disease is related to bilateral hydronephrosis and reflux  nephropathy. She also has a recurrent chronic UTIs. She has been poorly compliant in the past and has refused dialysis/access planning. Over the past 2 weeks prior to presentation she is been experiencing progressive nausea weakness and anorexia and  subsequently developed myoclonic jerks consistent with severe uremia.  Active Problems:  Leukocytosis/Hydronephrosis (bilateral)/Chronic UTI She presented with a WBC count of 31,100. Given her history of recurrent UTIs it is suspected that this is the likely source of the leukocytosis although given her acute renal decompensation this could all just be related to acidosis and inflammatory change. She is afebrile so it is less likely that an acute infectious process is the etiology to the leukocytosis. Blood cultures and urine cultures have been obtained. Her urinalysis is abnormal with greater than 300 of protein, large leukocytes, too numerous to count wbc's and rbc's, many squamous epithelials and many bacteria. She has been placed empirically on Rocephin IV-today is day #2. Lower PT CBC in the morning.  Disposition At the present time she will remain in the step down unit until she demonstrates she is tolerating hemodialysis well and her electrolyte abnormalities have stabilized. She also may have the degrees of SIRS or involved sepsis appear  LOS: 1 day   Erin Hearing, ANP pager [phone]  [facility]-team 8 [email or web address] Password: [email or web address]  03/05/2011, 11:42 AM

## 2011-03-05 NOTE — Op Note (Signed)
NAME: Mary Wilcox    MRN: [id number] DOB: Mar 19, 1955    DATE OF OPERATION: 03/05/2011  PREOP DIAGNOSIS: Chronic kidney disease  POSTOP DIAGNOSIS: same  PROCEDURE: 1. Ultrasound guided placement of right IJ 23 cm dietetic catheter. 2. Exploration of left upper arm cephalic vein and basilic vein. 3. Placement of left upper arm loop AV graft  SURGEON: Judeth Cornfield. Scot Dock, MD, FACS  ASSIST: Evorn Gong PA  ANESTHESIA: local with sedation   EBL: local with sedation  INDICATIONS: Mary Wilcox is a 55 y.o. female with chronic kidney disease. We were asked to place a dialysis access and she is to begin dialysis.  FINDINGS: the cephalic vein was tiny and not usable for an AV fistula. Likewise the basilic vein in the left arm was not usable. She had a very small brachial artery at the antecubital level,  therefore an upper arm loop graft was placed.  TECHNIQUE: the patient was taken to the operating room and sedated by anesthesia. The neck and upper chest were prepped and draped in usual sterile fashion. After the skin was infiltrated with 1% lidocaine, and under ultrasound guidance, the right IJ was cannulated and a guidewire introduced into the superior vena cava under fluoroscopic control. The tract over the wire was dilated and then a dilator and peel-away sheath were passed over the wire and the wire and dilator removed. The catheter was passed through the peel-away sheath in position and the right atrium. The exit site the cath was selected and the skin anesthetized between the 2 areas. The catheter was then brought through the tunnel cut to the appropriate length and the distal ports were attached. Both ports withdrew easily it was then flushed with heparinized saline and filled with concentrated heparin. The catheter was secured at its exit site with a 3-0 nylon suture. The IJ cannulation site was closed with a 4-0 subcuticular stitch. A dressing was applied.  Next the left upper  extremity was prepped and draped in the usual sterile fashion. Preoperative vein mapping suggested the basilic vein and cephalic veins were very small. I interrogated myself with the Doppler the veins appeared very small. I elected to explore them however to give her every chance of having a fistula. After the skin was anesthetized, a transverse incision was made at the antecubital level. Here the cephalic vein was noted to be 1-2 mm in size and was not usable as a fistula. Likewise the basilic vein was very small. The brachial artery itself was very small also. I therefore elected to place an upper arm loop graft in order to take the graft off a larger artery. A separate longitudinal incision was made beneath the axilla after the skin was anesthetized. Here the high brachial vein and high brachial artery were dissected free. The artery was larger at this level. A 4-7 mm graft was then tunneled in a loop fashion in the upper arm with the arterial aspect laterally. The patient was then heparinized. The high brachial artery was clamped proximally and distally. A longitudinal arteriotomy was made.a short segment of the 4 mm end of the graft was excised, the graft slightly spatulated, and the graft was sewn end-to-side to the brachial artery using continuous 6-0 Prolene suture. The graft and pulled the appropriate length for anastomosis to the brachial vein. The vein was ligated distally. Then spatulated proximally. It took a 5 mm dilator. The graft was cut to the appropriate length spatulated and sewn end-to-end to the vein using continuous  6-0 Prolene suture. At the completion was an excellent thrill in the graft. There was a radial and ulnar signal with the Doppler. Hemostasis was obtained in the wounds. The wounds were closed the deep layer of 3-0 Vicryl and the skin closed with 4-0 Vicryl. Dermabond was applied. All needle and sponge counts were correct. The patient tolerated the procedure well and was transferred  to the recovery room in stable condition.  Deitra Mayo, MD, FACS [facility]  DATE OF DICTATION:   03/05/2011

## 2011-03-05 NOTE — Progress Notes (Signed)
This patient presents for placement of a left AV fistula and Diatek catheter. I have reviewed her vein mapped from July when she was seen in the office by Dr. Kellie Simmering. The vein appears quite small. Both the cephalic vein and basilic veins are quite small. I plan on placement of the catheter. The veins are not adequate, we'll place an AV graft as discussed with Dr. Posey Pronto.  Mary Wilcox. Scot Dock, [place], [place] [phone] 03/05/2011

## 2011-03-05 NOTE — H&P (Signed)
Mary Wilcox is an 55 y.o. female.   PCP - Usually followed by Dr. Justin Mend Nephrologist. Chief Complaint: Abnormal labs was told to come to the ER. HPI: 55 year-old female with chronic kidney disease and as per patient her creatinine is usually 7 had blood work done yesterday and was told by her nephrologist to come to the hospital today because of abnormal labs. Her bicarbonate in the hospital labs done today is around 8 and creatinines around 16 she also has significant leukocytosis. Denies any shortness of breath or chest pain she states she makes urine. Denies abdominal pain nausea vomiting or diarrhea. Denies any fever chills. Patient has significant metabolic acidosis initially pulmonary critical care was called for admission but at this time hospitalist has been requested for admission.  Past Medical History  Diagnosis Date  . Chronic kidney disease   . Thyroid disorder   . Cancer     cervical ca 2006    Past Surgical History  Procedure Date  . Ureter surgery     stent  . Tubal ligation     History reviewed. No pertinent family history. Social History:  reports that she has never smoked. She does not have any smokeless tobacco history on file. She reports that she does not drink alcohol or use illicit drugs.  Allergies:  Allergies  Allergen Reactions  . Prozac (Fluoxetine Hcl) Hives    Medications Prior to Admission  Medication Dose Route Frequency Provider Last Rate Last Dose  . 0.9 %  sodium chloride infusion   Intravenous Continuous Angelia Mould, MD      . cefTRIAXone (ROCEPHIN) 1 g in dextrose 5 % 50 mL IVPB  1 g Intravenous Once Jasper Riling. Pickering, MD      . cefUROXime (ZINACEF) 1.5 g in dextrose 5 % 50 mL IVPB  1.5 g Intravenous On Call to [place], MD      . sodium bicarbonate 150 mEq in dextrose 5 % 1,000 mL infusion   Intravenous Continuous Jasper Riling. Alvino Chapel, MD 100 mL/hr at 03/04/11 2230     No current outpatient prescriptions on file  as of 03/04/2011.    Results for orders placed during the hospital encounter of 03/04/11 (from the past 48 hour(s))  CBC     Status: Abnormal   Collection Time   03/04/11  7:46 PM      Component Value Range Comment   WBC 31.1 (*) 4.0 - 10.5 (K/uL)    RBC 2.80 (*) 3.87 - 5.11 (MIL/uL)    Hemoglobin 8.8 (*) 12.0 - 15.0 (g/dL)    HCT 24.6 (*) 36.0 - 46.0 (%)    MCV 87.9  78.0 - 100.0 (fL)    MCH 31.4  26.0 - 34.0 (pg)    MCHC 35.8  30.0 - 36.0 (g/dL)    RDW 14.8  11.5 - 15.5 (%)    Platelets 741 (*) 150 - 400 (K/uL)   DIFFERENTIAL     Status: Abnormal   Collection Time   03/04/11  7:46 PM      Component Value Range Comment   Neutrophils Relative 86 (*) 43 - 77 (%)    Lymphocytes Relative 8 (*) 12 - 46 (%)    Monocytes Relative 6  3 - 12 (%)    Eosinophils Relative 0  0 - 5 (%)    Basophils Relative 0  0 - 1 (%)    Neutro Abs 26.7 (*) 1.7 - 7.7 (K/uL)  Lymphs Abs 2.5  0.7 - 4.0 (K/uL)    Monocytes Absolute 1.9 (*) 0.1 - 1.0 (K/uL)    Eosinophils Absolute 0.0  0.0 - 0.7 (K/uL)    Basophils Absolute 0.0  0.0 - 0.1 (K/uL)    WBC Morphology MILD LEFT SHIFT (1-5% METAS, OCC MYELO, OCC BANDS)      Smear Review LARGE PLATELETS PRESENT     BASIC METABOLIC PANEL     Status: Abnormal   Collection Time   03/04/11  7:46 PM      Component Value Range Comment   Sodium 127 (*) 135 - 145 (mEq/L)    Potassium 3.9  3.5 - 5.1 (mEq/L)    Chloride 85 (*) 96 - 112 (mEq/L)    CO2 9 (*) 19 - 32 (mEq/L)    Glucose, Bld 100 (*) 70 - 99 (mg/dL)    BUN 211 (*) 6 - 23 (mg/dL)    Creatinine, Ser 16.44 (*) 0.50 - 1.10 (mg/dL)    Calcium 11.1 (*) 8.4 - 10.5 (mg/dL)    GFR calc non Af Amer 2 (*) >90 (mL/min)    GFR calc Af Amer 2 (*) >90 (mL/min)   PROTIME-INR     Status: Abnormal   Collection Time   03/04/11  7:46 PM      Component Value Range Comment   Prothrombin Time 16.4 (*) 11.6 - 15.2 (seconds)    INR 1.30  0.00 - 1.49    APTT     Status: Normal   Collection Time   03/04/11  7:46 PM       Component Value Range Comment   aPTT 37  24 - 37 (seconds)   POCT I-STAT 3, BLOOD GAS (G3+)     Status: Abnormal   Collection Time   03/04/11  9:23 PM      Component Value Range Comment   pH, Arterial 7.264 (*) 7.350 - 7.400     pCO2 arterial 17.5 (*) 35.0 - 45.0 (mmHg)    pO2, Arterial 115.0 (*) 80.0 - 100.0 (mmHg)    Bicarbonate 7.9 (*) 20.0 - 24.0 (mEq/L)    TCO2 8  0 - 100 (mmol/L)    O2 Saturation 98.0      Acid-base deficit 17.0 (*) 0.0 - 2.0 (mmol/L)    Collection site RADIAL, ALLEN'S TEST ACCEPTABLE      Drawn by RT      Sample type ARTERIAL      Comment NOTIFIED PHYSICIAN     URINALYSIS, ROUTINE W REFLEX MICROSCOPIC     Status: Abnormal   Collection Time   03/04/11  9:36 PM      Component Value Range Comment   Color, Urine RED (*) YELLOW  BIOCHEMICALS MAY BE AFFECTED BY COLOR   APPearance CLOUDY (*) CLEAR     Specific Gravity, Urine 1.016  1.005 - 1.030     pH 5.5  5.0 - 8.0     Glucose, UA NEGATIVE  NEGATIVE (mg/dL)    Hgb urine dipstick LARGE (*) NEGATIVE     Bilirubin Urine NEGATIVE  NEGATIVE     Ketones, ur NEGATIVE  NEGATIVE (mg/dL)    Protein, ur >300 (*) NEGATIVE (mg/dL)    Urobilinogen, UA 0.2  0.0 - 1.0 (mg/dL)    Nitrite NEGATIVE  NEGATIVE     Leukocytes, UA LARGE (*) NEGATIVE    URINE MICROSCOPIC-ADD ON     Status: Abnormal   Collection Time   03/04/11  9:36 PM  Component Value Range Comment   Squamous Epithelial / LPF MANY (*) RARE     WBC, UA TOO NUMEROUS TO COUNT  <3 (WBC/hpf)    RBC / HPF TOO NUMEROUS TO COUNT  <3 (RBC/hpf)    Bacteria, UA MANY (*) RARE     Dg Chest Port 1 View  03/04/2011  *RADIOLOGY REPORT*  Clinical Data: Shortness of breath, elevated white count  PORTABLE CHEST - 1 VIEW  Comparison: [facility] chest radiograph dated 07/25/2004  Findings: Lungs are clear.  No pleural effusion or pneumothorax.  Cardiomediastinal silhouette is within normal limits.  IMPRESSION: No evidence of acute cardiopulmonary disease.  Original Report  Authenticated By: Julian Hy, M.D.    Review of Systems  HENT: Negative.   Eyes: Negative.   Respiratory: Negative.   Cardiovascular: Negative.   Gastrointestinal: Negative.   Genitourinary: Negative.   Musculoskeletal: Negative.   Skin: Negative.   Neurological: Positive for weakness.  Endo/Heme/Allergies: Negative.   Psychiatric/Behavioral: Negative.     Blood pressure 157/83, pulse 61, temperature 97.9 F (36.6 C), temperature source Oral, resp. rate 22, SpO2 100.00%. Physical Exam  Constitutional: She is oriented to person, place, and time. She appears well-developed and well-nourished. No distress.  HENT:  Head: Normocephalic and atraumatic.  Right Ear: External ear normal.  Left Ear: External ear normal.  Nose: Nose normal.  Mouth/Throat: Oropharynx is clear and moist. No oropharyngeal exudate.  Eyes: Conjunctivae and EOM are normal. Pupils are equal, round, and reactive to light. Right eye exhibits no discharge. Left eye exhibits no discharge. No scleral icterus.  Neck: Normal range of motion. Neck supple.  Cardiovascular: Normal rate, regular rhythm and normal heart sounds.   Respiratory: Effort normal and breath sounds normal. No respiratory distress. She has no wheezes. She has no rales.  GI: Soft. Bowel sounds are normal. She exhibits no distension. There is no tenderness. There is no rebound.  Musculoskeletal: Normal range of motion. She exhibits no edema and no tenderness.  Neurological: She is alert and oriented to person, place, and time.       Moves upper and lower limbs.  Skin: Skin is warm and dry. No rash noted. She is not diaphoretic. No erythema.  Psychiatric: Her behavior is normal.     Assessment/Plan #1. Acute renal failure on chronic kidney disease with metabolic acidosis - patient has been already started on bicarbonate drip recheck her metabolic panel in a.m. Keep patient on strict intake output and daily weights. I have discussed with Dr. Graylon Gunning nephrologist on call. Further recommendations per nephrology. #2. Significant leukocytosis - we'll get blood cultures and urine cultures and at this time UA shows possibility of UTI so patient has been started on ceftriaxone which we will continue. #3. History of cancer cervix status post radiation and chemotherapy.  CODE STATUS - full code.    Evelina Lore N. 03/05/2011, 12:17 AM

## 2011-03-05 NOTE — Consult Note (Signed)
Reason for Consult: Advanced CKD, now needing dialysis Referring Physician: Debera Lat MD  Mary Wilcox is an 55 y.o. female.  HPI: 55 yo WF with a history of cervical cancer with bilateral hydronephrosis s/p double J stent placement/revision with CKD from what appears to be reflux nephropathy. Has chronic UTIs as well that have compounded CKD progression. She has been poorly compliant in the past refusing dialysis/access planning. Now for the last 2-3 weeks has been having worsening nausea, weakness and decreasing appetite along with development of myoclonic jerks. Seen earlier today in clinic by Dr.Webb who did labs and found her to have a creatinine that was up to 18 and BUN of 211. Also has a metabolic acidosis with bicarbonate of 8. Not hyperkalemic and volume status acceptable. Advised to be admitted to start HD- now to which she is agreeable.     Past Medical History  Diagnosis Date  . Chronic kidney disease   . Thyroid disorder   . Cancer     cervical ca 2006    Past Surgical History  Procedure Date  . Ureter surgery     stent  . Tubal ligation     History reviewed. No pertinent family history.  Social History:  reports that she has never smoked. She does not have any smokeless tobacco history on file. She reports that she does not drink alcohol or use illicit drugs.  Allergies:  Allergies  Allergen Reactions  . Prozac (Fluoxetine Hcl) Hives    Medications:  Scheduled:   . cefTRIAXone (ROCEPHIN)  IV  1 g Intravenous Once    Results for orders placed during the hospital encounter of 03/04/11 (from the past 48 hour(s))  CBC     Status: Abnormal   Collection Time   03/04/11  7:46 PM      Component Value Range Comment   WBC 31.1 (*) 4.0 - 10.5 (K/uL)    RBC 2.80 (*) 3.87 - 5.11 (MIL/uL)    Hemoglobin 8.8 (*) 12.0 - 15.0 (g/dL)    HCT 24.6 (*) 36.0 - 46.0 (%)    MCV 87.9  78.0 - 100.0 (fL)    MCH 31.4  26.0 - 34.0 (pg)    MCHC 35.8  30.0 - 36.0 (g/dL)    RDW 14.8  11.5 - 15.5 (%)    Platelets 741 (*) 150 - 400 (K/uL)   DIFFERENTIAL     Status: Abnormal   Collection Time   03/04/11  7:46 PM      Component Value Range Comment   Neutrophils Relative 86 (*) 43 - 77 (%)    Lymphocytes Relative 8 (*) 12 - 46 (%)    Monocytes Relative 6  3 - 12 (%)    Eosinophils Relative 0  0 - 5 (%)    Basophils Relative 0  0 - 1 (%)    Neutro Abs 26.7 (*) 1.7 - 7.7 (K/uL)    Lymphs Abs 2.5  0.7 - 4.0 (K/uL)    Monocytes Absolute 1.9 (*) 0.1 - 1.0 (K/uL)    Eosinophils Absolute 0.0  0.0 - 0.7 (K/uL)    Basophils Absolute 0.0  0.0 - 0.1 (K/uL)    WBC Morphology MILD LEFT SHIFT (1-5% METAS, OCC MYELO, OCC BANDS)      Smear Review LARGE PLATELETS PRESENT     BASIC METABOLIC PANEL     Status: Abnormal   Collection Time   03/04/11  7:46 PM      Component Value Range Comment  Sodium 127 (*) 135 - 145 (mEq/L)    Potassium 3.9  3.5 - 5.1 (mEq/L)    Chloride 85 (*) 96 - 112 (mEq/L)    CO2 9 (*) 19 - 32 (mEq/L)    Glucose, Bld 100 (*) 70 - 99 (mg/dL)    BUN 211 (*) 6 - 23 (mg/dL)    Creatinine, Ser 16.44 (*) 0.50 - 1.10 (mg/dL)    Calcium 11.1 (*) 8.4 - 10.5 (mg/dL)    GFR calc non Af Amer 2 (*) >90 (mL/min)    GFR calc Af Amer 2 (*) >90 (mL/min)   PROTIME-INR     Status: Abnormal   Collection Time   03/04/11  7:46 PM      Component Value Range Comment   Prothrombin Time 16.4 (*) 11.6 - 15.2 (seconds)    INR 1.30  0.00 - 1.49    APTT     Status: Normal   Collection Time   03/04/11  7:46 PM      Component Value Range Comment   aPTT 37  24 - 37 (seconds)   POCT I-STAT 3, BLOOD GAS (G3+)     Status: Abnormal   Collection Time   03/04/11  9:23 PM      Component Value Range Comment   pH, Arterial 7.264 (*) 7.350 - 7.400     pCO2 arterial 17.5 (*) 35.0 - 45.0 (mmHg)    pO2, Arterial 115.0 (*) 80.0 - 100.0 (mmHg)    Bicarbonate 7.9 (*) 20.0 - 24.0 (mEq/L)    TCO2 8  0 - 100 (mmol/L)    O2 Saturation 98.0      Acid-base deficit 17.0 (*) 0.0 - 2.0  (mmol/L)    Collection site RADIAL, ALLEN'S TEST ACCEPTABLE      Drawn by RT      Sample type ARTERIAL      Comment NOTIFIED PHYSICIAN     URINALYSIS, ROUTINE W REFLEX MICROSCOPIC     Status: Abnormal   Collection Time   03/04/11  9:36 PM      Component Value Range Comment   Color, Urine RED (*) YELLOW  BIOCHEMICALS MAY BE AFFECTED BY COLOR   APPearance CLOUDY (*) CLEAR     Specific Gravity, Urine 1.016  1.005 - 1.030     pH 5.5  5.0 - 8.0     Glucose, UA NEGATIVE  NEGATIVE (mg/dL)    Hgb urine dipstick LARGE (*) NEGATIVE     Bilirubin Urine NEGATIVE  NEGATIVE     Ketones, ur NEGATIVE  NEGATIVE (mg/dL)    Protein, ur >300 (*) NEGATIVE (mg/dL)    Urobilinogen, UA 0.2  0.0 - 1.0 (mg/dL)    Nitrite NEGATIVE  NEGATIVE     Leukocytes, UA LARGE (*) NEGATIVE    URINE MICROSCOPIC-ADD ON     Status: Abnormal   Collection Time   03/04/11  9:36 PM      Component Value Range Comment   Squamous Epithelial / LPF MANY (*) RARE     WBC, UA TOO NUMEROUS TO COUNT  <3 (WBC/hpf)    RBC / HPF TOO NUMEROUS TO COUNT  <3 (RBC/hpf)    Bacteria, UA MANY (*) RARE      Dg Chest Port 1 View  03/04/2011  *RADIOLOGY REPORT*  Clinical Data: Shortness of breath, elevated white count  PORTABLE CHEST - 1 VIEW  Comparison: [facility] chest radiograph dated 07/25/2004  Findings: Lungs are clear.  No pleural effusion or pneumothorax.  Cardiomediastinal  silhouette is within normal limits.  IMPRESSION: No evidence of acute cardiopulmonary disease.  Original Report Authenticated By: Julian Hy, M.D.    Review of Systems  Constitutional: Positive for chills, weight loss and malaise/fatigue. Negative for fever and diaphoresis.  HENT: Positive for neck pain. Negative for hearing loss, ear pain, nosebleeds, congestion, sore throat, tinnitus and ear discharge.   Eyes: Negative for blurred vision, double vision, photophobia, pain, discharge and redness.  Respiratory: Positive for cough and shortness of breath.  Negative for hemoptysis, sputum production, wheezing and stridor.   Cardiovascular: Negative for chest pain, palpitations, orthopnea, claudication, leg swelling and PND.  Gastrointestinal: Positive for nausea and abdominal pain. Negative for heartburn, vomiting, diarrhea, constipation, blood in stool and melena.  Genitourinary: Positive for urgency, frequency and flank pain. Negative for dysuria and hematuria.  Musculoskeletal: Positive for myalgias and back pain. Negative for joint pain and falls.  Skin: Negative for itching and rash.  Neurological: Positive for dizziness, tremors and weakness. Negative for tingling, sensory change, speech change, focal weakness, seizures, loss of consciousness and headaches.  Endo/Heme/Allergies: Negative for environmental allergies and polydipsia. Does not bruise/bleed easily.  Psychiatric/Behavioral: Negative for depression, suicidal ideas, hallucinations, memory loss and substance abuse. The patient is nervous/anxious. The patient does not have insomnia.   All other systems reviewed and are negative.   Blood pressure 173/86, pulse 70, temperature 97.9 F (36.6 C), temperature source Oral, resp. rate 21, SpO2 99.00%. Physical Exam  Nursing note and vitals reviewed. Constitutional: She is oriented to person, place, and time. She appears well-developed and well-nourished. No distress.  HENT:  Head: Normocephalic and atraumatic.  Right Ear: External ear normal.  Left Ear: External ear normal.  Nose: Nose normal.  Mouth/Throat: No oropharyngeal exudate.  Eyes: Conjunctivae and EOM are normal. Pupils are equal, round, and reactive to light. Right eye exhibits no discharge. Left eye exhibits no discharge. No scleral icterus.  Neck: Normal range of motion. Neck supple. No JVD present. No tracheal deviation present. No thyromegaly present.  Cardiovascular: Normal rate and regular rhythm.  Exam reveals no gallop and no friction rub.   Murmur heard. Respiratory:  Effort normal. No stridor. No respiratory distress. She has no wheezes. She has rales. She exhibits no tenderness.  GI: Soft. She exhibits distension. She exhibits no mass. There is tenderness. There is no rebound and no guarding.  Musculoskeletal: Normal range of motion. She exhibits edema. She exhibits no tenderness.  Lymphadenopathy:    She has no cervical adenopathy.  Neurological: She is alert and oriented to person, place, and time. No cranial nerve deficit. Coordination normal.       ASTERIXIS PRESENT  Skin: Skin is warm and dry. No rash noted. She is not diaphoretic. No erythema. No pallor.  Psychiatric: Her behavior is normal. Judgment and thought content normal.    Assessment/Plan: 1. Advanced CKD: Now ESRD and plan to start dialysis as soon as tomorrow after dialysis catheter placed (in tandem with AVF for permanent access option). Agree with intravenous bicarbonate drip for acidemia management overnight. No acute dialysis needs tonight. Clinically now displaying signs of uremia.  2. Metabolic acidosis: Bicarb drip, dialysis tomorrow 3. Anemia: Check iron studies and start ESA 4. UTI/Leukocytosis: on rocephin, urin cultures pending  Raihan Kimmel K. 03/05/2011, 12:51 AM

## 2011-03-05 NOTE — Progress Notes (Signed)
Subjective: Interval History: has complaints unhappy.  Objective: Vital signs in last 24 hours: Temp:  [97.5 F (36.4 C)-97.9 F (36.6 C)] 97.6 F (36.4 C) (12/20 0800) Pulse Rate:  [56-84] 65  (12/20 0800) Resp:  [15-24] 16  (12/20 0800) BP: (113-191)/(63-109) 139/65 mmHg (12/20 0800) SpO2:  [98 %-100 %] 99 % (12/20 0800) Weight:  [75.5 kg (166 lb 7.2 oz)-76 kg (167 lb 8.8 oz)] 167 lb 8.8 oz (76 kg) (12/20 0500) Weight change:   Intake/Output from previous day: 12/19 0701 - 12/20 0700 In: 440 [I.V.:440] Out: 50 [Urine:50] Intake/Output this shift:    General appearance: alert and pale Resp: diminished breath sounds bilaterally Cardio: S1, S2 normal and systolic murmur: holosystolic 2/6, blowing at apex GI: liver down 4 cm Extremities: edema 2 plus  Lab Results:  Basename 03/05/11 0500 03/04/11 1946  WBC 27.7* 31.1*  HGB 8.3* 8.8*  HCT 23.3* 24.6*  PLT 731* 741*   BMET:  Basename 03/05/11 0500 03/04/11 1946  NA 132* 127*  K 3.3* 3.9  CL 86* 85*  CO2 12* 9*  GLUCOSE 116* 100*  BUN 211* 211*  CREATININE 15.62* 16.44*  CALCIUM 10.6* 11.1*   No results found for this basename: PTH:2 in the last 72 hours Iron Studies: No results found for this basename: IRON,TIBC,TRANSFERRIN,FERRITIN in the last 72 hours  Studies/Results: Dg Chest Port 1 View  03/04/2011  *RADIOLOGY REPORT*  Clinical Data: Shortness of breath, elevated white count  PORTABLE CHEST - 1 VIEW  Comparison: [facility] chest radiograph dated 07/25/2004  Findings: Lungs are clear.  No pleural effusion or pneumothorax.  Cardiomediastinal silhouette is within normal limits.  IMPRESSION: No evidence of acute cardiopulmonary disease.  Original Report Authenticated By: Julian Hy, M.D.    I have reviewed the patient's current medications.  Assessment/Plan: 1 ESRD adherence and non acceptance are primary issues.  [place], needs HD after access 2 Anemia start EPO, check fe 3 HPTH check 4 Non adherence  counsel P access, HD, counsel, check labs    LOS: 1 day   Mary Wilcox 03/05/2011,8:55 AM

## 2011-03-06 ENCOUNTER — Inpatient Hospital Stay (HOSPITAL_COMMUNITY): Payer: BC Managed Care – PPO

## 2011-03-06 ENCOUNTER — Encounter (HOSPITAL_COMMUNITY): Payer: Self-pay | Admitting: Vascular Surgery

## 2011-03-06 DIAGNOSIS — N189 Chronic kidney disease, unspecified: Secondary | ICD-10-CM | POA: Diagnosis present

## 2011-03-06 DIAGNOSIS — D75839 Thrombocytosis, unspecified: Secondary | ICD-10-CM | POA: Diagnosis present

## 2011-03-06 DIAGNOSIS — D473 Essential (hemorrhagic) thrombocythemia: Secondary | ICD-10-CM | POA: Diagnosis present

## 2011-03-06 DIAGNOSIS — E782 Mixed hyperlipidemia: Secondary | ICD-10-CM

## 2011-03-06 DIAGNOSIS — N179 Acute kidney failure, unspecified: Secondary | ICD-10-CM

## 2011-03-06 DIAGNOSIS — I10 Essential (primary) hypertension: Secondary | ICD-10-CM

## 2011-03-06 LAB — RENAL FUNCTION PANEL
Albumin: 2.9 g/dL — ABNORMAL LOW (ref 3.5–5.2)
Albumin: 2.7 g/dL — ABNORMAL LOW (ref 3.5–5.2)
BUN: 102 mg/dL — ABNORMAL HIGH (ref 6–23)
BUN: 98 mg/dL — ABNORMAL HIGH (ref 6–23)
CO2: 18 mEq/L — ABNORMAL LOW (ref 19–32)
CO2: 21 mEq/L (ref 19–32)
Calcium: 9.4 mg/dL (ref 8.4–10.5)
Calcium: 9.6 mg/dL (ref 8.4–10.5)
Chloride: 95 mEq/L — ABNORMAL LOW (ref 96–112)
Chloride: 92 mEq/L — ABNORMAL LOW (ref 96–112)
Creatinine, Ser: 8.94 mg/dL — ABNORMAL HIGH (ref 0.50–1.10)
Creatinine, Ser: 8.07 mg/dL — ABNORMAL HIGH (ref 0.50–1.10)
GFR calc Af Amer: 5 mL/min — ABNORMAL LOW (ref >90)
GFR calc Af Amer: 6 mL/min — ABNORMAL LOW (ref >90)
GFR calc non Af Amer: 4 mL/min — ABNORMAL LOW (ref >90)
GFR calc non Af Amer: 5 mL/min — ABNORMAL LOW (ref >90)
Glucose, Bld: 123 mg/dL — ABNORMAL HIGH (ref 70–99)
Glucose, Bld: 136 mg/dL — ABNORMAL HIGH (ref 70–99)
Phosphorus: 7.2 mg/dL — ABNORMAL HIGH (ref 2.3–4.6)
Phosphorus: 6 mg/dL — ABNORMAL HIGH (ref 2.3–4.6)
Potassium: 3.4 mEq/L — ABNORMAL LOW (ref 3.5–5.1)
Potassium: 2.7 mEq/L — CL (ref 3.5–5.1)
Sodium: 139 mEq/L (ref 135–145)
Sodium: 135 mEq/L (ref 135–145)

## 2011-03-06 LAB — DIFFERENTIAL
Basophils Absolute: 0.1 10*3/uL (ref 0.0–0.1)
Basophils Relative: 0 % (ref 0–1)
Eosinophils Absolute: 0 10*3/uL (ref 0.0–0.7)
Eosinophils Relative: 0 % (ref 0–5)
Lymphocytes Relative: 5 % — ABNORMAL LOW (ref 12–46)
Lymphs Abs: 1.5 10*3/uL (ref 0.7–4.0)
Monocytes Absolute: 1.7 10*3/uL — ABNORMAL HIGH (ref 0.1–1.0)
Monocytes Relative: 6 % (ref 3–12)
Neutro Abs: 27.2 10*3/uL — ABNORMAL HIGH (ref 1.7–7.7)
Neutrophils Relative %: 89 % — ABNORMAL HIGH (ref 43–77)

## 2011-03-06 LAB — CBC
HCT: 20.6 % — ABNORMAL LOW (ref 36.0–46.0)
HCT: 19.2 % — ABNORMAL LOW (ref 36.0–46.0)
Hemoglobin: 7.5 g/dL — ABNORMAL LOW (ref 12.0–15.0)
Hemoglobin: 6.6 g/dL — CL (ref 12.0–15.0)
MCH: 29.9 pg (ref 26.0–34.0)
MCH: 30.6 pg (ref 26.0–34.0)
MCHC: 33.5 g/dL (ref 30.0–36.0)
MCHC: 34.4 g/dL (ref 30.0–36.0)
MCV: 89.2 fL (ref 78.0–100.0)
MCV: 88.9 fL (ref 78.0–100.0)
Platelets: 568 10*3/uL — ABNORMAL HIGH (ref 150–400)
Platelets: 488 10*3/uL — ABNORMAL HIGH (ref 150–400)
RBC: 2.31 MIL/uL — ABNORMAL LOW (ref 3.87–5.11)
RBC: 2.16 MIL/uL — ABNORMAL LOW (ref 3.87–5.11)
RDW: 14.9 % (ref 11.5–15.5)
RDW: 14.9 % (ref 11.5–15.5)
WBC: 30.5 10*3/uL — ABNORMAL HIGH (ref 4.0–10.5)
WBC: 22.1 10*3/uL — ABNORMAL HIGH (ref 4.0–10.5)

## 2011-03-06 LAB — HEPATITIS B CORE ANTIBODY, TOTAL: Hep B Core Total Ab: NEGATIVE

## 2011-03-06 IMAGING — US US RENAL PORT
1 series · 14 of 21 positions shown · non-contrast
Comparison: [HOSPITAL]CT [DATE]. [HOSPITAL]
ultrasound [DATE].

CLINICAL DATA: Renal failure.

RENAL/URINARY TRACT ULTRASOUND COMPLETE

[Series 1: us renal port · 0.30mm/px · 14 of 21 slices shown]
[im 1/21]
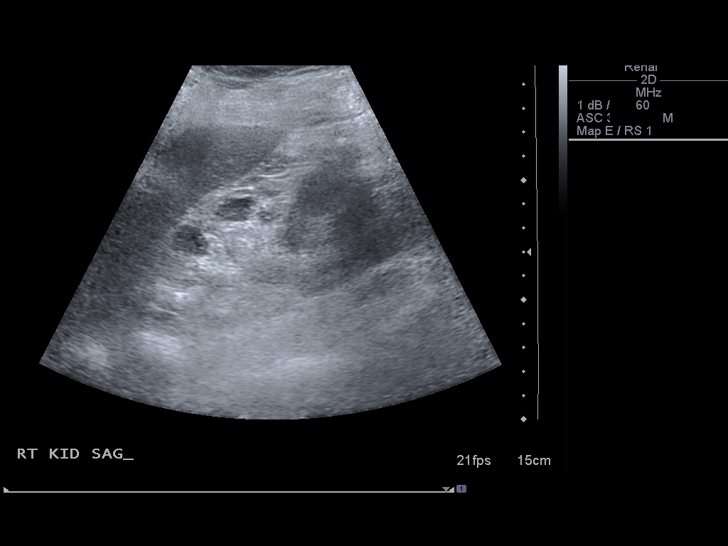
[im 3/21]
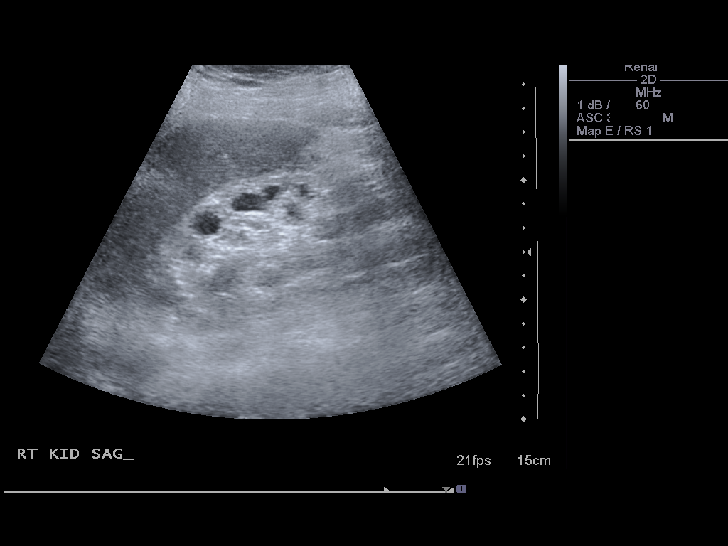
[im 4/21]
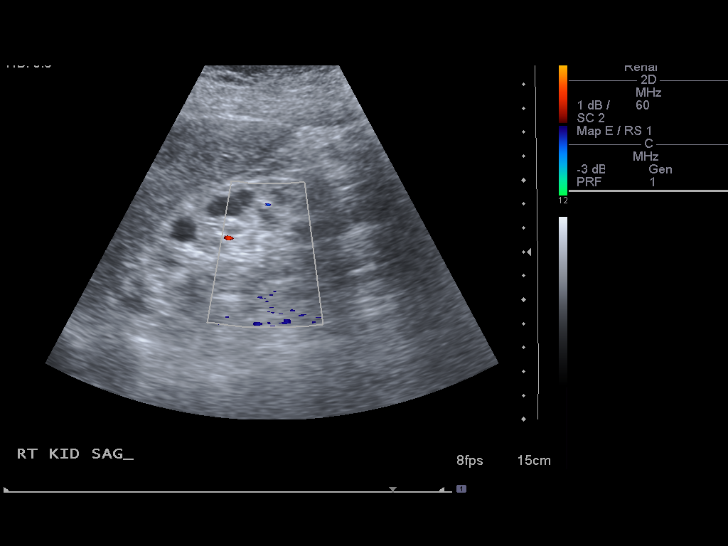
[im 6/21]
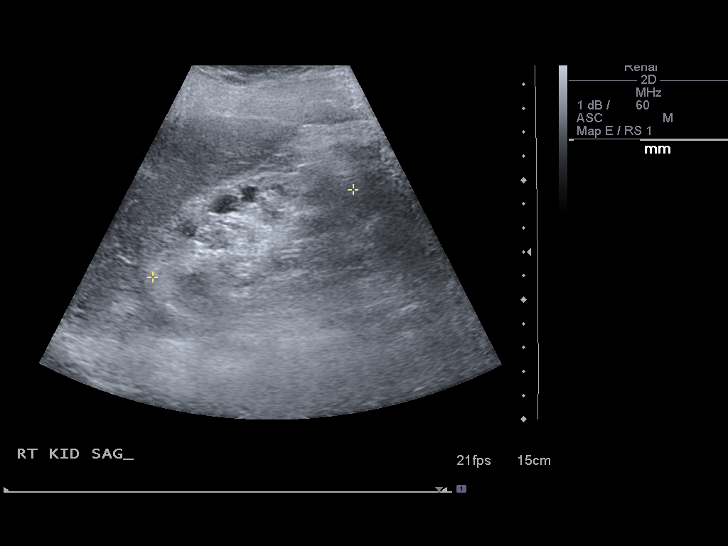
[im 7/21]
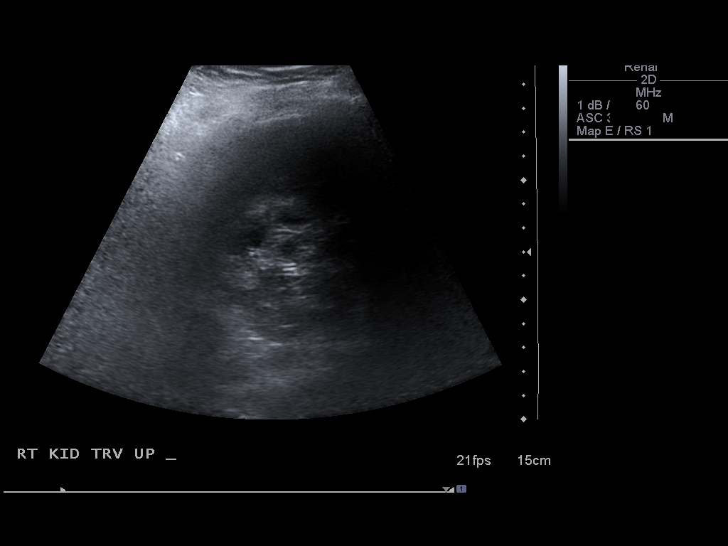
[im 9/21]
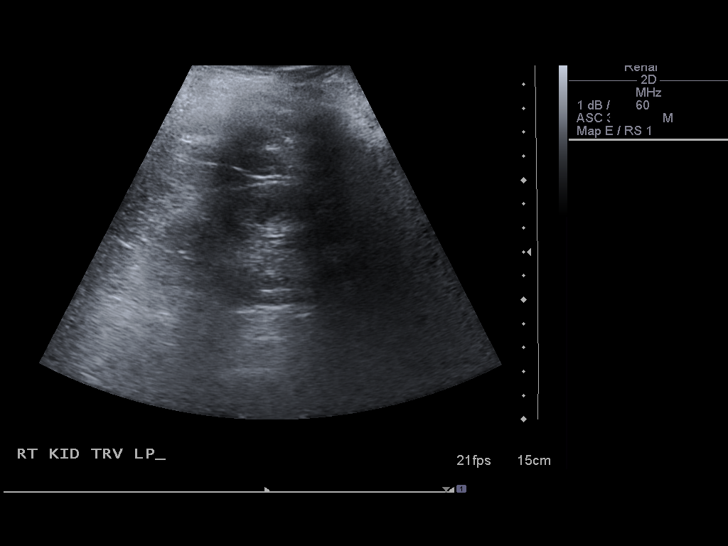
[im 10/21]
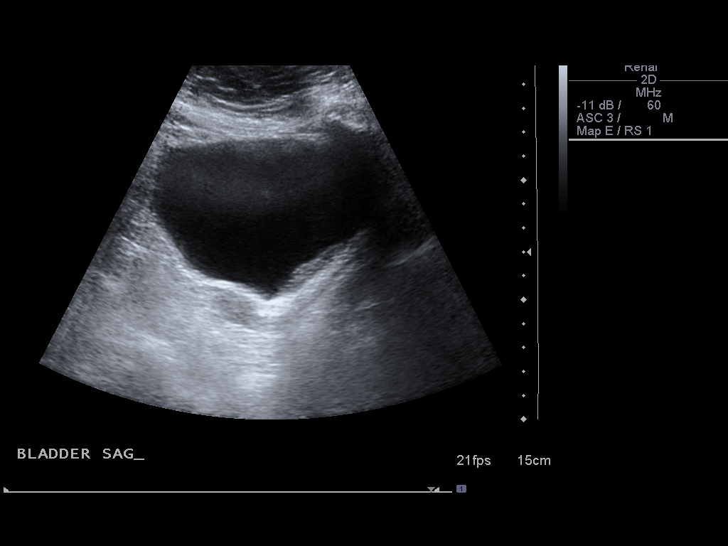
[im 12/21]
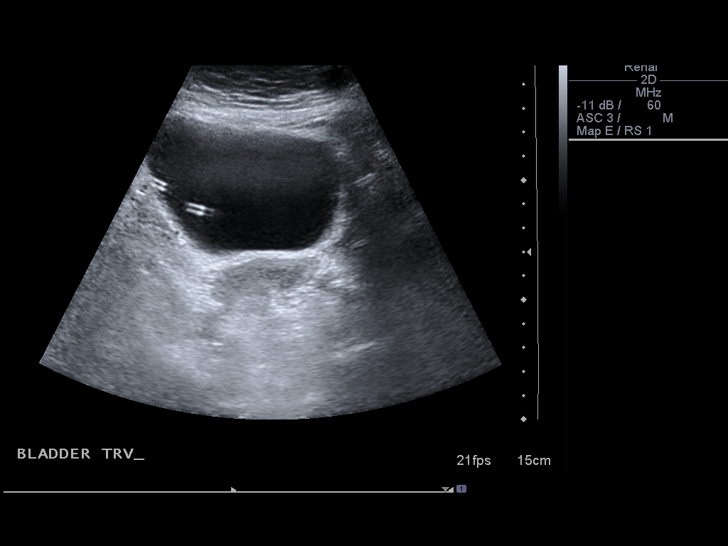
[im 13/21]
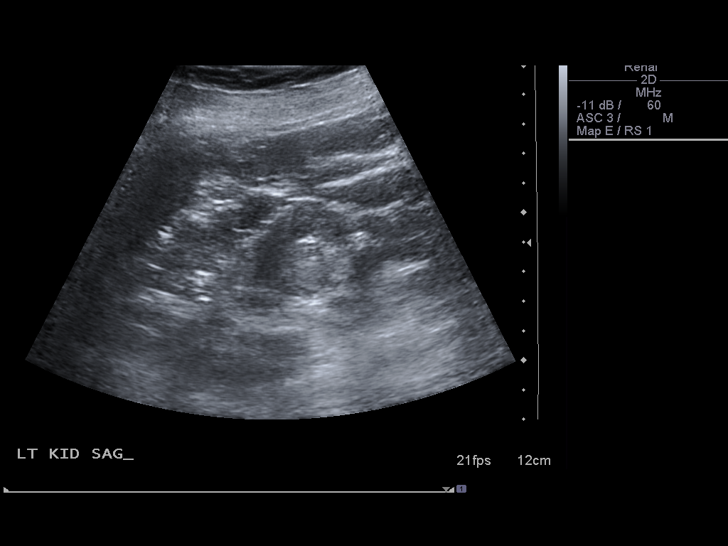
[im 15/21]
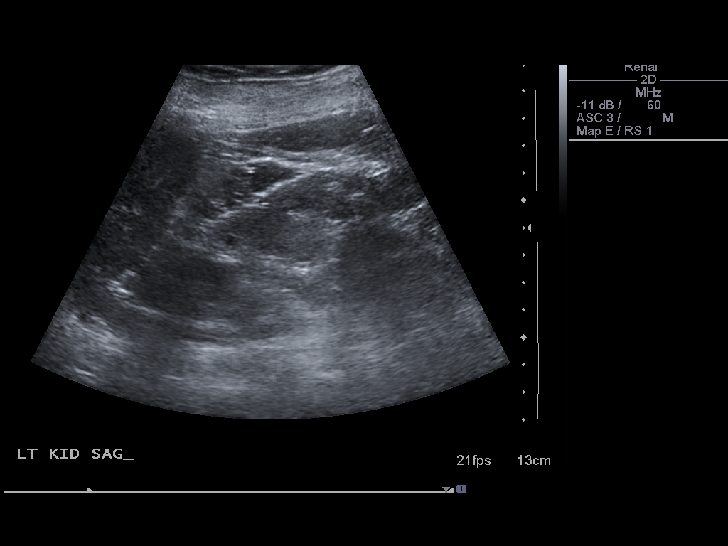
[im 16/21]
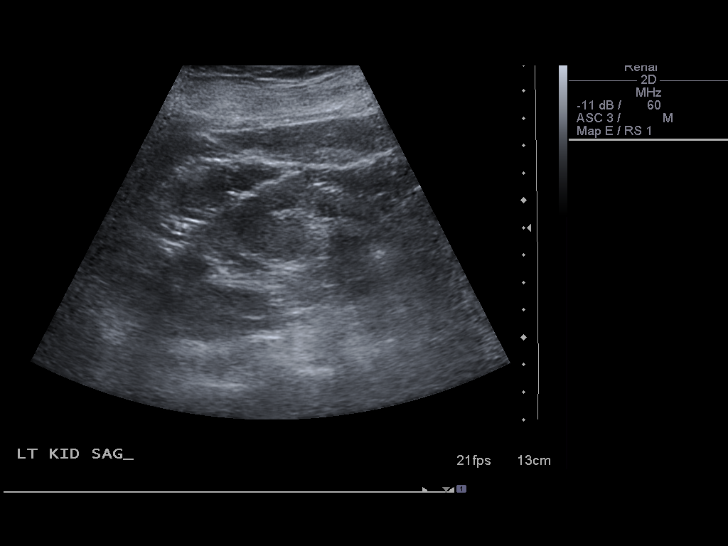
[im 18/21]
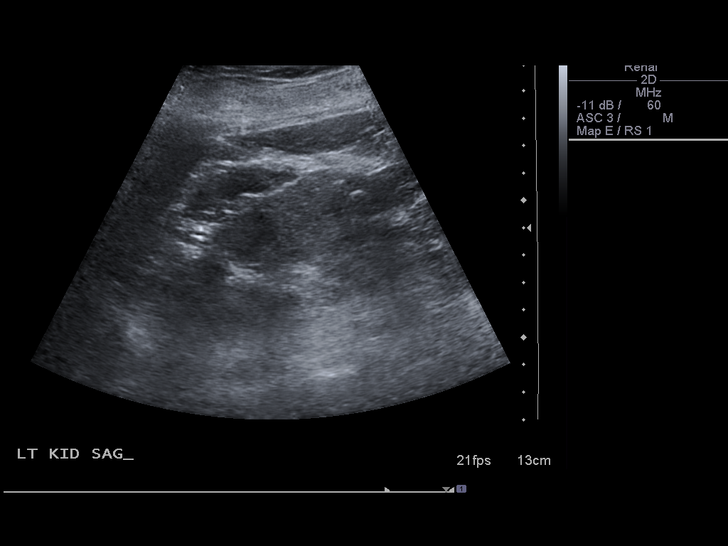
[im 19/21]
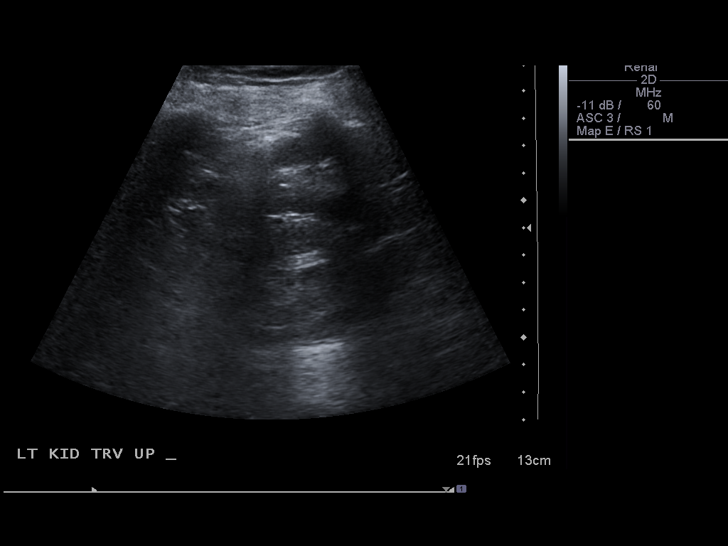
[im 21/21]
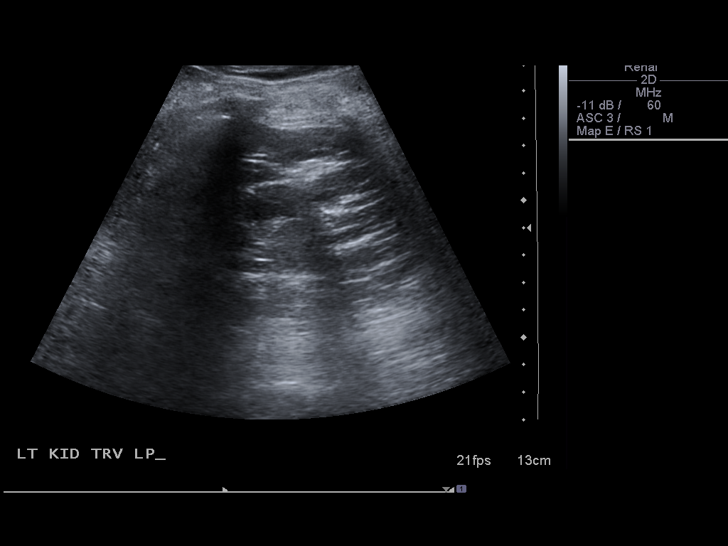

[14 of 21 positions shown; findings below may reference images not displayed]

FINDINGS: Right Kidney:  9.1 cm.  Severely echogenic.  No hydronephrosis.
Diffuse cortical thinning.

Left Kidney:  9.4 cm.  Severely echogenic.  Diffuse cortical
thinning.  No hydronephrosis.

Bladder:  Unremarkable.  Right ureteral stent is seen extending
into the bladder.
IMPRESSION: Severely echogenic kidneys bilaterally with cortical thinning
compatible with chronic medical renal disease.

Right ureteral stent visualized.

No hydronephrosis.

## 2011-03-06 MED ORDER — HEPARIN SODIUM (PORCINE) 1000 UNIT/ML DIALYSIS
100.0000 [IU]/kg | INTRAMUSCULAR | Status: DC | PRN
  Administered 2011-03-06: 7600 [IU] via INTRAVENOUS_CENTRAL
  Filled 2011-03-06: qty 8

## 2011-03-06 MED ORDER — ONDANSETRON HCL 4 MG/2ML IJ SOLN
4.0000 mg | INTRAMUSCULAR | Status: DC
  Administered 2011-03-06 – 2011-03-08 (×7): 4 mg via INTRAVENOUS
  Filled 2011-03-06 (×6): qty 2

## 2011-03-06 MED ORDER — ONDANSETRON HCL 4 MG PO TABS
4.0000 mg | ORAL_TABLET | ORAL | Status: DC
  Administered 2011-03-07 (×2): 4 mg via ORAL
  Filled 2011-03-06 (×13): qty 1

## 2011-03-06 MED ORDER — OXYCODONE HCL 5 MG PO TABS
5.0000 mg | ORAL_TABLET | Freq: Four times a day (QID) | ORAL | Status: DC | PRN
  Administered 2011-03-06 – 2011-03-15 (×26): 5 mg via ORAL
  Filled 2011-03-06 (×25): qty 1

## 2011-03-06 MED ORDER — HEPARIN SODIUM (PORCINE) 1000 UNIT/ML DIALYSIS
100.0000 [IU]/kg | INTRAMUSCULAR | Status: DC | PRN
  Filled 2011-03-06: qty 8

## 2011-03-06 MED ORDER — SODIUM CHLORIDE 0.9 % IJ SOLN
62.5000 mg | INTRAVENOUS | Status: DC
  Administered 2011-03-06: 62.5 mg via INTRAVENOUS
  Filled 2011-03-06 (×2): qty 5

## 2011-03-06 MED ORDER — OXYCODONE HCL 5 MG PO TABS
ORAL_TABLET | ORAL | Status: AC
  Administered 2011-03-07: 5 mg via ORAL
  Filled 2011-03-06: qty 1

## 2011-03-06 NOTE — Progress Notes (Signed)
HEMODIALYSIS- See flowsheet. Dr. Jimmy Footman paged about critical lab value: HGB=6.6/HCT 19.2. No orders received. Will recheck in AM per Dr. Jimmy Footman.

## 2011-03-06 NOTE — Progress Notes (Signed)
VASCULAR PROGRESS NOTE  SUBJECTIVE: had pain in the left upper arm last night which has improved.  PHYSICAL EXAM: Filed Vitals:   03/05/11 2200 03/05/11 2300 03/06/11 0045 03/06/11 0442  BP: 130/58 164/70 168/79 149/71  Pulse: 86 90 96 93  Temp:   98.7 F (37.1 C) 98.8 F (37.1 C)  TempSrc:   Oral Oral  Resp: 25 23 22 18   Height:      Weight:   166 lb 14.2 oz (75.7 kg)   SpO2: 100% 100% 99% 100%   Lungs: clear to auscultation Significant ecchymosis in the left upper extremity from tunneling. Good thrill and bruit and left upper arm AV graft. Left hand adequately perfused.  LABS: Lab Results  Component Value Date   WBC 30.5* 03/06/2011   HGB 7.5* 03/06/2011   HCT 20.6* 03/06/2011   MCV 89.2 03/06/2011   PLT 568* 03/06/2011   Lab Results  Component Value Date   CREATININE 8.94* 03/06/2011   Lab Results  Component Value Date   INR 1.27 03/05/2011   CBG (last 3)  No results found for this basename: GLUCAP:3 in the last 72 hours   ASSESSMENT/PLAN: 1. 1 Day Post-Op s/p: INSERTION OF DIALYSIS CATHETER INSERTION OF ARTERIOVENOUS (AV) GORE-TEX GRAFT LEFT UPPER ARM 2. Will be available as needed.  Deitra Mayo, MD, FACS Beeper: [phone] 03/06/2011

## 2011-03-06 NOTE — Progress Notes (Signed)
  Consult for renal diet ed in pt with new HD  Pt followed low sodium, low phosphorus diet prior to admit. Instructed pt on HD diet.  Renal book provided with RD name and number.  Pt expressed no questions at this time.  Mary Wilcox, [place]

## 2011-03-06 NOTE — Progress Notes (Signed)
Subjective: Interval History: has complaints sore L arm.  Objective: Vital signs in last 24 hours: Temp:  [96.6 F (35.9 C)-98.8 F (37.1 C)] 98.8 F (37.1 C) (12/21 0442) Pulse Rate:  [30-96] 93  (12/21 0442) Resp:  [15-25] 18  (12/21 0442) BP: (91-171)/(20-79) 149/71 mmHg (12/21 0442) SpO2:  [77 %-100 %] 100 % (12/21 0442) Weight:  [75.6 kg (166 lb 10.7 oz)-77.2 kg (170 lb 3.1 oz)] 166 lb 14.2 oz (75.7 kg) (12/21 0045) Weight change: 1.7 kg (3 lb 12 oz)  Intake/Output from previous day: 12/20 0701 - 12/21 0700 In: 800 [I.V.:800] Out: 1025 [Blood:50] Intake/Output this shift:    General appearance: alert, cooperative and pale Resp: clear to auscultation bilaterally Cardio: S1, S2 normal and systolic murmur: holosystolic 2/6, blowing at lower left sternal border GI: pos bs, liver down 4 cm Extremities: LUA bruised, pos B &  T  Lab Results:  Basename 03/06/11 0446 03/05/11 0500  WBC 30.5* 27.7*  HGB 7.5* 8.3*  HCT 20.6* 23.3*  PLT 568* 731*   BMET:  Basename 03/06/11 0446 03/05/11 0500  NA 139 132*  K 3.4* 3.3*  CL 95* 86*  CO2 18* 12*  GLUCOSE 123* 116*  BUN 102* 211*  CREATININE 8.94* 15.62*  CALCIUM 9.4 10.6*   No results found for this basename: PTH:2 in the last 72 hours Iron Studies:  Basename 03/05/11 0109  IRON 73  TIBC 189*  TRANSFERRIN --  FERRITIN 1188*    Studies/Results: X-ray Chest Pa Or Ap  03/05/2011  *RADIOLOGY REPORT*  Clinical Data: Post dialysis catheter placement  CHEST - 1 VIEW  Comparison: Portable chest x-ray of 03/04/2011  Findings: A right-sided dialysis catheter is present with the tips at the expected SVC - RA junction.  No pneumothorax is seen.  The lungs are clear.  The heart is within normal limits in size. Mediastinal contours are stable.  IMPRESSION: Dialysis catheter tips at SVC/RA junction.  No pneumothorax.  Original Report Authenticated By: Joretta Bachelor, M.D.   Dg Chest Port 1 View  03/04/2011  *RADIOLOGY REPORT*   Clinical Data: Shortness of breath, elevated white count  PORTABLE CHEST - 1 VIEW  Comparison: [facility] chest radiograph dated 07/25/2004  Findings: Lungs are clear.  No pleural effusion or pneumothorax.  Cardiomediastinal silhouette is within normal limits.  IMPRESSION: No evidence of acute cardiopulmonary disease.  Original Report Authenticated By: Julian Hy, M.D.   Dg Fluoro Guide Cv Line-no Report  03/05/2011  CLINICAL DATA: operative   FLOURO GUIDE CV LINE  Fluoroscopy was utilized by the requesting physician.  No radiographic  interpretation.      I have reviewed the patient's current medications.  Assessment/Plan: 1 CRF for HD today, get Clipped.  Vol fair, less uremic 2 Anemic EPO use Fe 3Nonadherence 4 HPTH P  P hd meds, EPO, Fe    LOS: 2 days   Kadia Abaya L 03/06/2011,8:08 AM

## 2011-03-06 NOTE — Progress Notes (Signed)
History of Present Illness: 55 y/o female with CKD followed by Dr. Justin Mend as an outpatient who was instructed by Dr. Justin Mend to come to the ED this evening for markedly abnormal labs and to initiate hemodialysis during this admission. The patient states that she has been feeling well recently, no fevers, chills, nausea, or vomiting. She has had some hiccups in the last 24 hours, but no abdominal pain. She has noticed some fatigue in the last few weeks and becomes short of breath on exertion. She has otherwise noted no resting dyspnea, swelling, or orthopnea. No recent cough, dysuria, diarrhea. She states that she was surprised to hear that her labs had progressed so quickly. Her urine output has been consistent in the last few weeks.  We were called to consider ICU placement because her serum bicarb was 8.   Lines / Drains:  PIV  Cultures / Sepsis markers:   Antibiotics:   Tests / Events:  Subjective: No events overnight.  Physical Exam: Filed Vitals:   03/06/11 0800  BP: 142/63  Pulse: 79  Temp: 98.2 F (36.8 C)  Resp: 20    Intake/Output Summary (Last 24 hours) at 03/06/11 1137 Last data filed at 03/05/11 2040  Gross per 24 hour  Intake    600 ml  Output   1025 ml  Net   -425 ml   Neuro: Alert and oriented. Cardiac: RRR, Nl S1/S2, -M/R/G. Pulmonary: Bibasilar rales. GI: Soft, NT, ND and +BS Extremities: 1+ edema and -tenderness.  Labs: CBC    Component Value Date/Time   WBC 30.5* 03/06/2011 0446   RBC 2.31* 03/06/2011 0446   HGB 7.5* 03/06/2011 0446   HCT 20.6* 03/06/2011 0446   PLT 568* 03/06/2011 0446   MCV 89.2 03/06/2011 0446   MCH 29.9 03/06/2011 0446   MCHC 33.5 03/06/2011 0446   RDW 14.9 03/06/2011 0446   LYMPHSABS 1.5 03/06/2011 0446   MONOABS 1.7* 03/06/2011 0446   EOSABS 0.0 03/06/2011 0446   BASOSABS 0.1 03/06/2011 0446    BMET    Component Value Date/Time   NA 139 03/06/2011 0446   K 3.4* 03/06/2011 0446   CL 95* 03/06/2011 0446   CO2 18*  03/06/2011 0446   GLUCOSE 123* 03/06/2011 0446   BUN 102* 03/06/2011 0446   CREATININE 8.94* 03/06/2011 0446   CALCIUM 9.4 03/06/2011 0446   GFRNONAA 4* 03/06/2011 0446   GFRAA 5* 03/06/2011 0446    @cmet @ ABG    Component Value Date/Time   PHART 7.264* 03/04/2011 2123   PCO2ART 17.5* 03/04/2011 2123   PO2ART 115.0* 03/04/2011 2123   HCO3 7.9* 03/04/2011 2123   TCO2 8 03/04/2011 2123   ACIDBASEDEF 17.0* 03/04/2011 2123   O2SAT 98.0 03/04/2011 2123    No results found for this basename: MG in the last 168 hours Lab Results  Component Value Date   CALCIUM 9.4 03/06/2011   PHOS 7.2* 03/06/2011    Chest Xray:   Assessment & Plan: 68F with CKD now in ESRD for whom PCCM was called to consider an ICU transfer since patient was profoundly acidotic.  Acidosis was due to renal failure (all metabolic) and patient was clinically stable.  Patient was deemed too stable to be in the ICU.  After dialysis the patient's acid-base status seemed much better.  There is no further need for PCCM here.  PCCM signing off, please call back if needed.   Mary Wilcox, Mary Wilcox

## 2011-03-06 NOTE — Progress Notes (Signed)
Subjective: Still has somewhat of a flat affect but appeared to be more engaging today. Complaining of persistent nausea as well as emesis after attempting to eat breakfast this morning. Also endorses left upper extremity AV fistula surgical site still remains very sore. Denies shortness of breath or chest pain.  Objective: Vital signs in last 24 hours: Temp:  [96.6 F (35.9 C)-98.8 F (37.1 C)] 98.2 F (36.8 C) (12/21 0800) Pulse Rate:  [30-96] 79  (12/21 0800) Resp:  [15-25] 20  (12/21 0800) BP: (91-171)/(20-79) 142/63 mmHg (12/21 0800) SpO2:  [77 %-100 %] 99 % (12/21 0800) Weight:  [75.6 kg (166 lb 10.7 oz)-77.2 kg (170 lb 3.1 oz)] 166 lb 14.2 oz (75.7 kg) (12/21 0045) Weight change: 1.7 kg (3 lb 12 oz) Last BM Date: 03/05/11  Intake/Output from previous day: 12/20 0701 - 12/21 0700 In: 800 [I.V.:800] Out: 1025 [Blood:50] Intake/Output this shift:    General appearance: cooperative, appears older than stated age, no distress and slowed mentation, uremic-appearing Resp: clear to auscultation bilaterally, she is on room air maintaining oxygen saturations between 99 and 100%, respiratory effort is nonlabored nontachypneic. Cardio: Normal sinus rhythm with regular rate and rhythm, S1, S2 normal, no murmur, click, rub or gallop GI: soft, non-tender; bowel sounds normal; no masses,  no organomegaly Extremities: extremities normal, atraumatic, no cyanosis or edema, left upper extremity AV fistula site has subtle palpable thrill and easily also will bruit, the skin surrounding the site is very ecchymotic with dark changes but no evidence of underlying hematoma Neurologic: Grossly normal but with an extremely flat affect, exam is otherwise nonfocal, patient is oriented x4.  Lab Results:  Basename 03/06/11 0446 03/05/11 0500  WBC 30.5* 27.7*  HGB 7.5* 8.3*  HCT 20.6* 23.3*  PLT 568* 731*   BMET  Basename 03/06/11 0446 03/05/11 0500  NA 139 132*  K 3.4* 3.3*  CL 95* 86*  CO2 18*  12*  GLUCOSE 123* 116*  BUN 102* 211*  CREATININE 8.94* 15.62*  CALCIUM 9.4 10.6*    Studies/Results: X-ray Chest Pa Or Ap  03/05/2011  *RADIOLOGY REPORT*  Clinical Data: Post dialysis catheter placement  CHEST - 1 VIEW  Comparison: Portable chest x-ray of 03/04/2011  Findings: A right-sided dialysis catheter is present with the tips at the expected SVC - RA junction.  No pneumothorax is seen.  The lungs are clear.  The heart is within normal limits in size. Mediastinal contours are stable.  IMPRESSION: Dialysis catheter tips at SVC/RA junction.  No pneumothorax.  Original Report Authenticated By: Joretta Bachelor, M.D.   Dg Chest Port 1 View  03/04/2011  *RADIOLOGY REPORT*  Clinical Data: Shortness of breath, elevated white count  PORTABLE CHEST - 1 VIEW  Comparison: [facility] chest radiograph dated 07/25/2004  Findings: Lungs are clear.  No pleural effusion or pneumothorax.  Cardiomediastinal silhouette is within normal limits.  IMPRESSION: No evidence of acute cardiopulmonary disease.  Original Report Authenticated By: Julian Hy, M.D.   Dg Fluoro Guide Cv Line-no Report  03/05/2011  CLINICAL DATA: operative   FLOURO GUIDE CV LINE  Fluoroscopy was utilized by the requesting physician.  No radiographic  interpretation.      Medications:  I have reviewed the patient's current medications. Scheduled:    . darbepoetin      . darbepoetin (ARANESP) injection - DIALYSIS  200 mcg Intravenous Q Thu-HD  . ferric glucontate (NULECIT) IV  62.5 mg Intravenous Weekly  . heparin  40 Units/kg Dialysis Once in  dialysis  . influenza  inactive virus vaccine  0.5 mL Intramuscular Tomorrow-1000  . multivitamin  1 tablet Oral Daily  . ondansetron (ZOFRAN) IV  4 mg Intravenous Q4H   Or  . ondansetron  4 mg Oral Q4H  . oxyCODONE        Assessment/Plan:  Principal Problem:  *Chronic kidney disease, stage V requiring chronic dialysis/Metabolic acidosis At admission this patient endorsed that  her baseline creatinine was 7.0 but unfortunately at presentation her creatinine had increased to 16.43 her pH on the blood gas was 7.26 with a compensatory hypocarbia of 17.5, her bicarbonate was low at 7.9 she had a significant base deficit of 17 all of this consistent with renal mediated acute on chronic metabolic acidosis. She was started on a bicarbonate drip by the admitting physician but this was only a temporizing measure until patient could undergo hemodialysis treatments. Fortunately she does not have associated hyperkalemia. Dr. Posey Pronto has seen this patient in consultation for the renal service. She underwent placement of a Diatek  and began dialysis treatments urgently on 03/05/2011. Today after one hemodialysis treatment her creatinine is down to 8.94 and her BUN has decreased from 211 to 102.  I have reviewed the nephrologist consultation note in the etiology of this patient's chronic kidney disease is related to bilateral hydronephrosis and reflux nephropathy. A renal ultrasound has been ordered his admission. She also has a recurrent chronic UTIs. She has been poorly compliant in the past and has refused dialysis/access planning. Over the past 2 weeks prior to presentation she is been experiencing progressive nausea weakness and anorexia and subsequently developed myoclonic jerks consistent with severe uremia.  She is having persistent pain in the left upper extremity AV fistula. The site appears to be functional and does not appear to have any type of infectious process and the skin. Will add a K pad today for comfort.  She is also asked for nutritional counseling regarding appropriate renal diet. I did mention to her it is especially important to be cautious with sodium intake because of significant undesired symptoms such as severe leg cramping while receiving dialysis if excessive sodium intake occurs.  Active Problems:  Leukocytosis/Hydronephrosis (bilateral)/Chronic UTI She presented  with a WBC count of 31,100 and her WBC count today is 30,500 after decreasing slightly on. Given her history of recurrent UTIs it is suspected that this is the likely source of the leukocytosis although given her acute renal decompensation this could all just be related to acidosis and inflammatory change. As noted above a renal ultrasound has been ordered this admission. She is afebrile so it is less likely that an acute infectious process is the etiology to the leukocytosis. Blood cultures and urine cultures have been obtained. Her urinalysis is abnormal with greater than 300 of protein, large leukocytes, too numerous to count wbc's and rbc's, many squamous epithelials and many bacteria. She has been placed empirically on Rocephin IV-today is day #3. Lower PT CBC in the morning. Pancultures thus far have been negative for any acute infectious process.  Persistent nausea This is likely due to underlying uremia and suspect will resolve after recurrent hemodialysis treatments. In the interim I will put her on scheduled Zofran IV or by mouth every 4 hours.  Thrombocytosis/anemia of chronic kidney disease Hemoglobin has drifted downward slightly from 8.8-7.5 this admission. She is hemodynamically stable. Aranesp has been started. We'll defer to nephrology as to if transfusion is indicated. As with the above-noted leukocytosis suspect her thrombocytosis is  also reactive related to progressive renal disease with recent acidosis and inflammatory change. We'll continue to follow CBC.  Disposition At the present time she will remain in the step down unit until she demonstrates she is tolerating hemodialysis well and her electrolyte abnormalities have stabilized. She also may have the degrees of SIRS or involved sepsis.  LOS: 2 days   Erin Hearing, ANP pager [phone]  [facility] [email or web address] Password: [email or web address]  03/06/2011, 10:37 AM

## 2011-03-07 ENCOUNTER — Inpatient Hospital Stay (HOSPITAL_COMMUNITY): Payer: BC Managed Care – PPO

## 2011-03-07 LAB — CBC
HCT: 20.2 % — ABNORMAL LOW (ref 36.0–46.0)
HCT: 20.4 % — ABNORMAL LOW (ref 36.0–46.0)
Hemoglobin: 6.8 g/dL — CL (ref 12.0–15.0)
Hemoglobin: 6.8 g/dL — CL (ref 12.0–15.0)
MCH: 30.6 pg (ref 26.0–34.0)
MCH: 30.8 pg (ref 26.0–34.0)
MCHC: 33.7 g/dL (ref 30.0–36.0)
MCHC: 33.3 g/dL (ref 30.0–36.0)
MCV: 91 fL (ref 78.0–100.0)
MCV: 92.3 fL (ref 78.0–100.0)
Platelets: 442 10*3/uL — ABNORMAL HIGH (ref 150–400)
Platelets: 497 10*3/uL — ABNORMAL HIGH (ref 150–400)
RBC: 2.22 MIL/uL — ABNORMAL LOW (ref 3.87–5.11)
RBC: 2.21 MIL/uL — ABNORMAL LOW (ref 3.87–5.11)
RDW: 14.9 % (ref 11.5–15.5)
RDW: 15.3 % (ref 11.5–15.5)
WBC: 38.8 10*3/uL — ABNORMAL HIGH (ref 4.0–10.5)
WBC: 38.6 10*3/uL — ABNORMAL HIGH (ref 4.0–10.5)

## 2011-03-07 LAB — RENAL FUNCTION PANEL
Albumin: 2.9 g/dL — ABNORMAL LOW (ref 3.5–5.2)
Albumin: 2.8 g/dL — ABNORMAL LOW (ref 3.5–5.2)
BUN: 58 mg/dL — ABNORMAL HIGH (ref 6–23)
BUN: 68 mg/dL — ABNORMAL HIGH (ref 6–23)
CO2: 22 mEq/L (ref 19–32)
CO2: 23 mEq/L (ref 19–32)
Calcium: 9.7 mg/dL (ref 8.4–10.5)
Calcium: 10.1 mg/dL (ref 8.4–10.5)
Chloride: 97 mEq/L (ref 96–112)
Chloride: 94 mEq/L — ABNORMAL LOW (ref 96–112)
Creatinine, Ser: 5.91 mg/dL — ABNORMAL HIGH (ref 0.50–1.10)
Creatinine, Ser: 6.72 mg/dL — ABNORMAL HIGH (ref 0.50–1.10)
GFR calc Af Amer: 8 mL/min — ABNORMAL LOW (ref >90)
GFR calc Af Amer: 7 mL/min — ABNORMAL LOW (ref >90)
GFR calc non Af Amer: 7 mL/min — ABNORMAL LOW (ref >90)
GFR calc non Af Amer: 6 mL/min — ABNORMAL LOW (ref >90)
Glucose, Bld: 108 mg/dL — ABNORMAL HIGH (ref 70–99)
Glucose, Bld: 114 mg/dL — ABNORMAL HIGH (ref 70–99)
Phosphorus: 5.5 mg/dL — ABNORMAL HIGH (ref 2.3–4.6)
Phosphorus: 6.3 mg/dL — ABNORMAL HIGH (ref 2.3–4.6)
Potassium: 3.3 mEq/L — ABNORMAL LOW (ref 3.5–5.1)
Potassium: 3.1 mEq/L — ABNORMAL LOW (ref 3.5–5.1)
Sodium: 138 mEq/L (ref 135–145)
Sodium: 137 mEq/L (ref 135–145)

## 2011-03-07 NOTE — Progress Notes (Signed)
Subjective: Continues to have a depressed affect.  Complains of pain in left arm where fistula was created yesterday.  Also has c/o nausea which started with dialysis. She is, however, able to tolerate her meals without difficult.   Objective: Blood pressure 163/86, pulse 105, temperature 99.3 F (37.4 C), temperature source Oral, resp. rate 18, height 5\' 1"  (1.549 m), weight 76.3 kg (168 lb 3.4 oz), SpO2 98.00%. Weight change: -1.7 kg (-3 lb 12 oz)  Intake/Output Summary (Last 24 hours) at 03/07/11 1027 Last data filed at 03/07/11 0600  Gross per 24 hour  Intake      0 ml  Output   1326 ml  Net  -1326 ml    Physical Exam: General appearance: cooperative, appears older than stated age, no distress and slowed mentation, uremic-appearing  Resp: clear to auscultation bilaterall Cardio: Normal sinus rhythm with regular rate and rhythm, S1, S2 normal, no murmur, click, rub or gallop  GI: soft, non-tender; bowel sounds normal; no masses, no organomegaly  Extremities: extremities normal, atraumatic, no cyanosis or edema, left upper extremity AV fistula site has subtle palpable thrill and easily also will bruit, the skin surrounding the site is very ecchymotic with dark changes but no evidence of underlying hematoma  Neurologic: Grossly normal   Lab Results:  Basename 03/07/11 0400 03/06/11 2025  NA 138 135  K 3.3* 2.7*  CL 97 92*  CO2 22 21  GLUCOSE 108* 136*  BUN 58* 98*  CREATININE 5.91* 8.07*  CALCIUM 9.7 9.6  MG -- --  PHOS 5.5* 6.0*    Basename 03/07/11 0400 03/06/11 2025 03/05/11 1748 03/05/11 0500  AST -- -- -- 17  ALT -- -- 17 24  ALKPHOS -- -- -- 96  BILITOT -- -- -- 0.2*  PROT -- -- -- 9.1*  ALBUMIN 2.9* 2.7* -- --   No results found for this basename: LIPASE:2,AMYLASE:2 in the last 72 hours  Basename 03/07/11 0400 03/06/11 2025 03/06/11 0446 03/04/11 1946  WBC 38.8* 22.1* -- --  NEUTROABS -- -- 27.2* 26.7*  HGB 6.8* 6.6* -- --  HCT 20.2* 19.2* -- --  MCV  91.0 88.9 -- --  PLT 442* 488* -- --   No results found for this basename: CKTOTAL:3,CKMB:3,CKMBINDEX:3,TROPONINI:3 in the last 72 hours No components found with this basename: POCBNP:3 No results found for this basename: DDIMER:2 in the last 72 hours No results found for this basename: HGBA1C:2 in the last 72 hours No results found for this basename: CHOL:2,HDL:2,LDLCALC:2,TRIG:2,CHOLHDL:2,LDLDIRECT:2 in the last 72 hours  Basename 03/05/11 0500  TSH 2.447  T4TOTAL --  T3FREE --  THYROIDAB --    Basename 03/05/11 0109  VITAMINB12 --  FOLATE --  FERRITIN 1188*  TIBC 189*  IRON 73  RETICCTPCT --    Micro Results: Recent Results (from the past 240 hour(s))  URINE CULTURE     Status: Normal   Collection Time   03/04/11  9:36 PM      Component Value Range Status Comment   Specimen Description URINE, CLEAN CATCH   Final    Special Requests NONE   Final    Setup Time [id number]   Final    Colony Count NO GROWTH   Final    Culture NO GROWTH   Final    Report Status 03/05/2011 FINAL   Final   MRSA PCR SCREENING     Status: Normal   Collection Time   03/05/11  2:26 AM      Component Value  Range Status Comment   MRSA by PCR NEGATIVE  NEGATIVE  Final   CULTURE, BLOOD (ROUTINE X 2)     Status: Normal (Preliminary result)   Collection Time   03/05/11  5:00 AM      Component Value Range Status Comment   Specimen Description BLOOD RIGHT HAND   Final    Special Requests BOTTLES DRAWN AEROBIC AND ANAEROBIC 5CC EACH   Final    Setup Time [id number]   Final    Culture     Final    Value:        BLOOD CULTURE RECEIVED NO GROWTH TO DATE CULTURE WILL BE HELD FOR 5 DAYS BEFORE ISSUING A FINAL NEGATIVE REPORT   Report Status PENDING   Incomplete   CULTURE, BLOOD (ROUTINE X 2)     Status: Normal (Preliminary result)   Collection Time   03/05/11  5:15 AM      Component Value Range Status Comment   Specimen Description BLOOD LEFT HAND   Final    Special Requests     Final    Value:  BOTTLES DRAWN AEROBIC AND ANAEROBIC 10CC AEROBIC [place] ANAEROBIC   Setup Time [id number]   Final    Culture     Final    Value:        BLOOD CULTURE RECEIVED NO GROWTH TO DATE CULTURE WILL BE HELD FOR 5 DAYS BEFORE ISSUING A FINAL NEGATIVE REPORT   Report Status PENDING   Incomplete      Medications: Scheduled Meds:   . darbepoetin (ARANESP) injection - DIALYSIS  200 mcg Intravenous Q Thu-HD  . ferric glucontate (NULECIT) IV  62.5 mg Intravenous Weekly  . influenza  inactive virus vaccine  0.5 mL Intramuscular Tomorrow-1000  . multivitamin  1 tablet Oral Daily  . ondansetron (ZOFRAN) IV  4 mg Intravenous Q4H   Or  . ondansetron  4 mg Oral Q4H   Continuous Infusions:  PRN Meds:.acetaminophen, acetaminophen, acetaminophen, acetaminophen, calcium carbonate (dosed in mg elemental calcium), camphor-menthol, docusate sodium, feeding supplement (NEPRO CARB STEADY), heparin, heparin, heparin, hydrALAZINE, hydrOXYzine, lidocaine, lidocaine-prilocaine, oxyCODONE, pentafluoroprop-tetrafluoroeth, sorbitol, zolpidem  Assessment/Plan: Chronic kidney disease, stage V requiring dialysis/Metabolic acidosis  At admission this patient endorsed that her baseline creatinine was 7.0 but unfortunately at presentation her creatinine had increased to 16.43 her pH on the blood gas was 7.26 with a compensatory hypocarbia of 17.5, her bicarbonate was low at 7.9 she had a significant base deficit of 17 all of this consistent with renal mediated acute on chronic metabolic acidosis. She was started on a bicarbonate drip by the admitting physician but this was only a temporizing measure until patient could undergo hemodialysis treatments. Fortunately she does not have associated hyperkalemia. Dr. Posey Pronto has seen this patient in consultation for the renal service. She underwent placement of a Diatek and began dialysis treatments urgently on 03/05/2011.   -  the nephrologist consultation note mentions the etiology of this  patient's chronic kidney disease is related to bilateral hydronephrosis and reflux nephropathy. A renal ultrasound has been ordered his admission and is consistent with medical-renal disease.   She also has a recurrent chronic UTIs.   She has been poorly compliant in the past and has refused dialysis/access planning. Over the past 2 weeks prior to presentation she is been experiencing progressive nausea weakness and anorexia and subsequently developed myoclonic jerks consistent with severe uremia.  She is having persistent pain in the left upper extremity AV fistula. The site appears to  be functional and does not appear to have any type of infectious process and the skin.   Leukocytosis/Hydronephrosis (bilateral)/Chronic UTI  She presented with a WBC count of 31,100.   Given her history of recurrent UTIs it is suspected that this is the likely source of the leukocytosis although given her acute renal decompensation this could all just be related to acidosis and inflammatory change.   Blood cultures and urine cultures have been obtained and are negative.  Her urinalysis is abnormal with greater than 300 of protein, large leukocytes, too numerous to count wbc's and rbc's, many squamous epithelials and many bacteria. She has been placed empirically on Rocephin IV-today is day #4.  - I will repeat a UA and urine culture. If culture is again negative, would recommend that we stop Rocephin.  Persistent nausea  This is likely due to underlying uremia and suspect will resolve after recurrent hemodialysis treatments.   Thrombocytosis/anemia of chronic kidney disease  Hemoglobin has drifted downward slightly from 8.8-7.5 this admission. She is hemodynamically stable. Aranesp and Ferric Gluconate has been started.  We'll defer to nephrology as to if transfusion is indicated.  As with the above-noted leukocytosis suspect her thrombocytosis is also reactive related to progressive renal disease with recent  acidosis and inflammatory change. We'll continue to follow CBC.   Disposition  At the present time she will remain in the step down unit until she demonstrates she is tolerating hemodialysis well and her electrolyte abnormalities have stabilized. She also may have the degrees of SIRS or involved sepsis.    LOS: 3 days   [facility] [phone] 03/07/2011, 10:27 AM

## 2011-03-07 NOTE — Progress Notes (Signed)
Subjective: Interval History: much better  Objective: Vital signs in last 24 hours: Temp:  [97.1 F (36.2 C)-99.2 F (37.3 C)] 99.2 F (37.3 C) (12/22 0100) Pulse Rate:  [76-107] 98  (12/22 0300) Resp:  [13-36] 36  (12/22 0205) BP: (124-193)/(46-81) 158/66 mmHg (12/22 0300) SpO2:  [95 %-99 %] 96 % (12/22 0300) Weight:  [75.5 kg (166 lb 7.2 oz)-76.3 kg (168 lb 3.4 oz)] 168 lb 3.4 oz (76.3 kg) (12/22 0100) Weight change: -1.7 kg (-3 lb 12 oz)  Intake/Output from previous day: 12/21 0701 - 12/22 0700 In: -  Out: 1075  Intake/Output this shift: Total I/O In: -  Out: 1075 [Other:1075]  General appearance: alert, cooperative and pale Resp: clear to auscultation bilaterally Cardio: S1, S2 normal and systolic murmur: systolic ejection 2/6, decrescendo at 2nd left intercostal space GI: liver down2 cm Extremities: AVG LUA  Lab Results:  Basename 03/07/11 0400 03/06/11 2025  WBC 38.8* 22.1*  HGB 6.8* 6.6*  HCT 20.2* 19.2*  PLT 442* 488*   BMET:  Basename 03/07/11 0400 03/06/11 2025  NA 138 135  K 3.3* 2.7*  CL 97 92*  CO2 22 21  GLUCOSE 108* 136*  BUN 58* 98*  CREATININE 5.91* 8.07*  CALCIUM 9.7 9.6   No results found for this basename: PTH:2 in the last 72 hours Iron Studies:  Basename 03/05/11 0109  IRON 73  TIBC 189*  TRANSFERRIN --  FERRITIN 1188*    Studies/Results: X-ray Chest Pa Or Ap  03/05/2011  *RADIOLOGY REPORT*  Clinical Data: Post dialysis catheter placement  CHEST - 1 VIEW  Comparison: Portable chest x-ray of 03/04/2011  Findings: A right-sided dialysis catheter is present with the tips at the expected SVC - RA junction.  No pneumothorax is seen.  The lungs are clear.  The heart is within normal limits in size. Mediastinal contours are stable.  IMPRESSION: Dialysis catheter tips at SVC/RA junction.  No pneumothorax.  Original Report Authenticated By: Joretta Bachelor, M.D.   US Renal Port  03/06/2011  *RADIOLOGY REPORT*  Clinical Data: Renal failure.   RENAL/URINARY TRACT ULTRASOUND COMPLETE  [facility]  [facility]. [facility] ultrasound 01/11/2007.  Findings:  Right Kidney:  9.1 cm.  Severely echogenic.  No hydronephrosis. Diffuse cortical thinning.  Left Kidney:  9.4 cm.  Severely echogenic.  Diffuse cortical thinning.  No hydronephrosis.  Bladder:  Unremarkable.  Right ureteral stent is seen extending into the bladder.  IMPRESSION: Severely echogenic kidneys bilaterally with cortical thinning compatible with chronic medical renal disease.  Right ureteral stent visualized.  No hydronephrosis.  Original Report Authenticated By: Raelyn Number, M.D.   Dg Fluoro Guide Cv Line-no Report  03/05/2011  CLINICAL DATA: operative   FLOURO GUIDE CV LINE  Fluoroscopy was utilized by the requesting physician.  No radiographic  interpretation.      I have reviewed the patient's current medications.  Assessment/Plan: ! ESRD 3rd HD, Awaiting CLIP.  Vol ok.  Uremia better 2 Anemian EPO, check Fe may need Tx 3HPTH 4 Nonadherence 5 HTn follow closely P HD, EPO, check Hb.    LOS: 3 days   Mary Wilcox 03/07/2011,5:36 AM

## 2011-03-08 LAB — PROCALCITONIN: Procalcitonin: 5.64 ng/mL

## 2011-03-08 MED ORDER — SODIUM CHLORIDE 0.9 % IV SOLN: 100.0000 mL | INTRAVENOUS | Status: DC | PRN

## 2011-03-08 MED ORDER — NEPRO/CARBSTEADY PO LIQD: 237.0000 mL | ORAL | Status: DC | PRN

## 2011-03-08 MED ORDER — HEPARIN SODIUM (PORCINE) 1000 UNIT/ML DIALYSIS: 100.0000 [IU]/kg | INTRAMUSCULAR | Status: DC | PRN

## 2011-03-08 MED ORDER — LIDOCAINE-PRILOCAINE 2.5-2.5 % EX CREA: 1.0000 "application " | TOPICAL_CREAM | CUTANEOUS | Status: DC | PRN

## 2011-03-08 MED ORDER — HEPARIN SODIUM (PORCINE) 1000 UNIT/ML DIALYSIS: 1000.0000 [IU] | INTRAMUSCULAR | Status: DC | PRN

## 2011-03-08 MED ORDER — ALTEPLASE 2 MG IJ SOLR
2.0000 mg | Freq: Once | INTRAMUSCULAR | Status: AC | PRN
  Filled 2011-03-08: qty 2

## 2011-03-08 MED ORDER — LIDOCAINE HCL (PF) 1 % IJ SOLN: 5.0000 mL | INTRAMUSCULAR | Status: DC | PRN

## 2011-03-08 MED ORDER — PENTAFLUOROPROP-TETRAFLUOROETH EX AERO: 1.0000 "application " | INHALATION_SPRAY | CUTANEOUS | Status: DC | PRN

## 2011-03-08 NOTE — Progress Notes (Signed)
Subjective: Interval History: none.  Objective: Vital signs in last 24 hours: Temp:  [98.3 F (36.8 C)-99.9 F (37.7 C)] 99.9 F (37.7 C) (12/23 0859) Pulse Rate:  [80-108] 98  (12/23 0859) Resp:  [18-35] 24  (12/23 0859) BP: (118-173)/(41-88) 135/61 mmHg (12/23 0859) SpO2:  [95 %-100 %] 95 % (12/23 0859) Weight:  [74.5 kg (164 lb 3.9 oz)-76.3 kg (168 lb 3.4 oz)] 166 lb 7.2 oz (75.5 kg) (12/23 0500) Weight change: 0.8 kg (1 lb 12.2 oz)  Intake/Output from previous day: 12/22 0701 - 12/23 0700 In: 540 [P.O.:540] Out: -344  Intake/Output this shift: Total I/O In: 120 [P.O.:120] Out: -   General appearance: alert, cooperative and pale Resp: diminished breath sounds bilaterally and rales bibasilar Cardio: S1, S2 normal and systolic murmur: systolic ejection 2/6, decrescendo at 2nd left intercostal space GI: liver down 4 cm, pos bs, soft Extremities: AVG LUA B & T  Lab Results:  Basename 03/07/11 1330 03/07/11 0400  WBC 38.6* 38.8*  HGB 6.8* 6.8*  HCT 20.4* 20.2*  PLT 497* 442*   BMET:  Basename 03/07/11 1330 03/07/11 0400  NA 137 138  K 3.1* 3.3*  CL 94* 97  CO2 23 22  GLUCOSE 114* 108*  BUN 68* 58*  CREATININE 6.72* 5.91*  CALCIUM 10.1 9.7   No results found for this basename: PTH:2 in the last 72 hours Iron Studies: No results found for this basename: IRON,TIBC,TRANSFERRIN,FERRITIN in the last 72 hours  Studies/Results: US Renal Port  03/06/2011  *RADIOLOGY REPORT*  Clinical Data: Renal failure.  RENAL/URINARY TRACT ULTRASOUND COMPLETE  [facility]  [facility]. [facility] ultrasound 01/11/2007.  Findings:  Right Kidney:  9.1 cm.  Severely echogenic.  No hydronephrosis. Diffuse cortical thinning.  Left Kidney:  9.4 cm.  Severely echogenic.  Diffuse cortical thinning.  No hydronephrosis.  Bladder:  Unremarkable.  Right ureteral stent is seen extending into the bladder.  IMPRESSION: Severely echogenic kidneys bilaterally with cortical  thinning compatible with chronic medical renal disease.  Right ureteral stent visualized.  No hydronephrosis.  Original Report Authenticated By: Raelyn Number, M.D.    I have reviewed the patient's current medications.  Assessment/Plan: 1 ESRD awaiting CLIP to get HD in am 2 Anemia will tx if <7 tomorrow 3 HPTH Vit D 4 HT controlled 5 Nonadherencd  p HD, EPO, Fe, CLIP    LOS: 4 days   Tonie Elsey L 03/08/2011,9:35 AM

## 2011-03-08 NOTE — Progress Notes (Signed)
Subjective: She states that the pain in the left arm where the fistula was created has improved today.  Also has continued c/o nausea and states that it remains moderately controlled as long as she is receiving medication. She is, however, able to tolerate her meals without difficult.   Objective: Blood pressure 130/55, pulse 81, temperature 99.8 F (37.7 C), temperature source Oral, resp. rate 18, height 5\' 1"  (1.549 m), weight 75.5 kg (166 lb 7.2 oz), SpO2 96.00%. Weight change: 0.8 kg (1 lb 12.2 oz)  Intake/Output Summary (Last 24 hours) at 03/08/11 0820 Last data filed at 03/07/11 1800  Gross per 24 hour  Intake    300 ml  Output   -344 ml  Net    644 ml    Physical Exam: General appearance: cooperative, appears older than stated age, no distress and slowed mentation, uremic-appearing  Resp: clear to auscultation bilaterall Cardio: Normal sinus rhythm with regular rate and rhythm, S1, S2 normal, no murmur, click, rub or gallop  GI: soft, non-tender; bowel sounds normal; no masses, no organomegaly  Extremities: extremities normal, atraumatic, no cyanosis or edema, left upper extremity AV fistula site has a palpable thrill; the skin surrounding the site is very ecchymotic but no evidence of underlying hematoma. Area is much less tender today.  Neurologic: Grossly normal   Lab Results:  Basename 03/07/11 1330 03/07/11 0400  NA 137 138  K 3.1* 3.3*  CL 94* 97  CO2 23 22  GLUCOSE 114* 108*  BUN 68* 58*  CREATININE 6.72* 5.91*  CALCIUM 10.1 9.7  MG -- --  PHOS 6.3* 5.5*    Basename 03/07/11 1330 03/07/11 0400 03/05/11 1748  AST -- -- --  ALT -- -- 17  ALKPHOS -- -- --  BILITOT -- -- --  PROT -- -- --  ALBUMIN 2.8* 2.9* --   No results found for this basename: LIPASE:2,AMYLASE:2 in the last 72 hours  Basename 03/07/11 1330 03/07/11 0400 03/06/11 0446  WBC 38.6* 38.8* --  NEUTROABS -- -- 27.2*  HGB 6.8* 6.8* --  HCT 20.4* 20.2* --  MCV 92.3 91.0 --  PLT 497* 442*  --   No results found for this basename: CKTOTAL:3,CKMB:3,CKMBINDEX:3,TROPONINI:3 in the last 72 hours No components found with this basename: POCBNP:3 No results found for this basename: DDIMER:2 in the last 72 hours No results found for this basename: HGBA1C:2 in the last 72 hours No results found for this basename: CHOL:2,HDL:2,LDLCALC:2,TRIG:2,CHOLHDL:2,LDLDIRECT:2 in the last 72 hours No results found for this basename: TSH,T4TOTAL,FREET3,T3FREE,THYROIDAB in the last 72 hours No results found for this basename: VITAMINB12:2,FOLATE:2,FERRITIN:2,TIBC:2,IRON:2,RETICCTPCT:2 in the last 72 hours  Micro Results: Recent Results (from the past 240 hour(s))  URINE CULTURE     Status: Normal   Collection Time   03/04/11  9:36 PM      Component Value Range Status Comment   Specimen Description URINE, CLEAN CATCH   Final    Special Requests NONE   Final    Setup Time [id number]   Final    Colony Count NO GROWTH   Final    Culture NO GROWTH   Final    Report Status 03/05/2011 FINAL   Final   MRSA PCR SCREENING     Status: Normal   Collection Time   03/05/11  2:26 AM      Component Value Range Status Comment   MRSA by PCR NEGATIVE  NEGATIVE  Final   CULTURE, BLOOD (ROUTINE X 2)     Status: Normal (  Preliminary result)   Collection Time   03/05/11  5:00 AM      Component Value Range Status Comment   Specimen Description BLOOD RIGHT HAND   Final    Special Requests BOTTLES DRAWN AEROBIC AND ANAEROBIC 5CC EACH   Final    Setup Time [id number]   Final    Culture     Final    Value:        BLOOD CULTURE RECEIVED NO GROWTH TO DATE CULTURE WILL BE HELD FOR 5 DAYS BEFORE ISSUING A FINAL NEGATIVE REPORT   Report Status PENDING   Incomplete   CULTURE, BLOOD (ROUTINE X 2)     Status: Normal (Preliminary result)   Collection Time   03/05/11  5:15 AM      Component Value Range Status Comment   Specimen Description BLOOD LEFT HAND   Final    Special Requests     Final    Value: BOTTLES DRAWN  AEROBIC AND ANAEROBIC 10CC AEROBIC [place] ANAEROBIC   Setup Time [id number]   Final    Culture     Final    Value:        BLOOD CULTURE RECEIVED NO GROWTH TO DATE CULTURE WILL BE HELD FOR 5 DAYS BEFORE ISSUING A FINAL NEGATIVE REPORT   Report Status PENDING   Incomplete      Medications: Scheduled Meds:    . darbepoetin (ARANESP) injection - DIALYSIS  200 mcg Intravenous Q Thu-HD  . ferric glucontate (NULECIT) IV  62.5 mg Intravenous Weekly  . multivitamin  1 tablet Oral Daily  . ondansetron (ZOFRAN) IV  4 mg Intravenous Q4H   Or  . ondansetron  4 mg Oral Q4H   Continuous Infusions:  PRN Meds:.acetaminophen, acetaminophen, acetaminophen, acetaminophen, calcium carbonate (dosed in mg elemental calcium), camphor-menthol, docusate sodium, feeding supplement (NEPRO CARB STEADY), heparin, heparin, heparin, hydrALAZINE, hydrOXYzine, lidocaine, lidocaine-prilocaine, oxyCODONE, pentafluoroprop-tetrafluoroeth, sorbitol, zolpidem  Assessment/Plan:  Chronic kidney disease, stage V requiring dialysis/Metabolic acidosis  -She has been poorly compliant in the past and has refused dialysis/access planning. Over the past 2 weeks prior to presentation she is been experiencing progressive nausea weakness and anorexia and subsequently developed myoclonic jerks consistent with severe uremia.   -At admission this patient endorsed that her baseline creatinine was 7.0 but unfortunately at presentation her creatinine had increased to 16.43 her pH on the blood gas was 7.26 with a compensatory hypocarbia of 17.5, her bicarbonate was low at 7.9 she had a significant base deficit of 17 all of this consistent with renal mediated acute on chronic metabolic acidosis.  -She underwent placement of a Diatek and began dialysis treatments urgently on 03/05/2011.  -  the nephrologist consultation note mentions the etiology of this patient's chronic kidney disease is related to bilateral hydronephrosis and reflux nephropathy.  A renal ultrasound has been ordered his admission and is consistent with medical-renal disease. -She also has a recurrent chronic UTIs.  -She has been tolerating dialysis and will be set up for outpt dialysis  Leukocytosis/Hydronephrosis (bilateral)/Chronic UTI  She presented with a WBC count of 31,100.   Given her history of recurrent UTIs it is suspected that this is the likely source of the leukocytosis although given her acute renal decompensation this could all just be related to acidosis and inflammatory change.  Unfortunately her counts have not improved despite Rocepin x 5 days.  Blood cultures and urine cultures have been obtained and are negative.  I have ordered a repeat UA and urine culture  on 12/22 which have yet to be sent.  I will check a procalcitonin. If negative, I will be discontinuing the Rocephin. We will continue to monitor her WBC counts.    Hypokalemia- should be replaced with dialysis.   Persistent nausea  This is likely due to underlying uremia and suspect will resolve after recurrent hemodialysis treatments.   Thrombocytosis/anemia of chronic kidney disease  Hemoglobin has drifted downward slightly from 8.8-6.8 this admission. She is hemodynamically stable. Aranesp and Ferric Gluconate has been started by nephrology.  As her hemoglobin has been persistently below 7, she may need a 1 unit PRBC to be administered while she is being dialyzed.    Disposition  We will transfer her to 6700 if a bed is available today.    LOS: 4 days   [facility] [id number] 03/08/2011, 8:20 AM

## 2011-03-09 ENCOUNTER — Inpatient Hospital Stay (HOSPITAL_COMMUNITY): Payer: BC Managed Care – PPO

## 2011-03-09 LAB — URINALYSIS, ROUTINE W REFLEX MICROSCOPIC
Bilirubin Urine: NEGATIVE
Glucose, UA: NEGATIVE mg/dL
Ketones, ur: NEGATIVE mg/dL
Nitrite: NEGATIVE
Protein, ur: >300 mg/dL — AB
Specific Gravity, Urine: 1.011 (ref 1.005–1.030)
Urobilinogen, UA: 0.2 mg/dL (ref 0.0–1.0)
pH: 6 (ref 5.0–8.0)

## 2011-03-09 LAB — PHOSPHORUS: Phosphorus: 5.9 mg/dL — ABNORMAL HIGH (ref 2.3–4.6)

## 2011-03-09 LAB — COMPREHENSIVE METABOLIC PANEL
ALT: 19 U/L (ref 0–35)
AST: 32 U/L (ref 0–37)
Albumin: 2.2 g/dL — ABNORMAL LOW (ref 3.5–5.2)
Alkaline Phosphatase: 98 U/L (ref 39–117)
BUN: 63 mg/dL — ABNORMAL HIGH (ref 6–23)
CO2: 22 mEq/L (ref 19–32)
Calcium: 9.2 mg/dL (ref 8.4–10.5)
Chloride: 92 mEq/L — ABNORMAL LOW (ref 96–112)
Creatinine, Ser: 7.64 mg/dL — ABNORMAL HIGH (ref 0.50–1.10)
GFR calc Af Amer: 6 mL/min — ABNORMAL LOW (ref >90)
GFR calc non Af Amer: 5 mL/min — ABNORMAL LOW (ref >90)
Glucose, Bld: 137 mg/dL — ABNORMAL HIGH (ref 70–99)
Potassium: 2.5 mEq/L — CL (ref 3.5–5.1)
Sodium: 133 mEq/L — ABNORMAL LOW (ref 135–145)
Total Bilirubin: 0.2 mg/dL — ABNORMAL LOW (ref 0.3–1.2)
Total Protein: 7.1 g/dL (ref 6.0–8.3)

## 2011-03-09 LAB — URINE MICROSCOPIC-ADD ON

## 2011-03-09 LAB — CBC
HCT: 21.8 % — ABNORMAL LOW (ref 36.0–46.0)
Hemoglobin: 7 g/dL — ABNORMAL LOW (ref 12.0–15.0)
MCH: 30.8 pg (ref 26.0–34.0)
MCHC: 32.1 g/dL (ref 30.0–36.0)
MCV: 96 fL (ref 78.0–100.0)
Platelets: 433 10*3/uL — ABNORMAL HIGH (ref 150–400)
RBC: 2.27 MIL/uL — ABNORMAL LOW (ref 3.87–5.11)
RDW: 15.1 % (ref 11.5–15.5)
WBC: 36 10*3/uL — ABNORMAL HIGH (ref 4.0–10.5)

## 2011-03-09 LAB — PARATHYROID HORMONE, INTACT (NO CA): PTH: 707.8 pg/mL — ABNORMAL HIGH (ref 14.0–72.0)

## 2011-03-09 MED ORDER — SODIUM CHLORIDE 0.9 % IV SOLN: 75.0000 mg/kg | Freq: Three times a day (TID) | INTRAVENOUS | Status: DC

## 2011-03-09 MED ORDER — VANCOMYCIN HCL 1000 MG IV SOLR
1500.0000 mg | Freq: Once | INTRAVENOUS | Status: AC
  Administered 2011-03-09: 1500 mg via INTRAVENOUS
  Filled 2011-03-09: qty 1500

## 2011-03-09 MED ORDER — SODIUM CHLORIDE 0.9 % IV SOLN
75.0000 mg/kg | Freq: Three times a day (TID) | INTRAVENOUS | Status: DC
  Filled 2011-03-09 (×2): qty 5.86

## 2011-03-09 MED ORDER — PIPERACILLIN-TAZOBACTAM 3.375 G IVPB
3.3750 g | Freq: Three times a day (TID) | INTRAVENOUS | Status: DC
  Filled 2011-03-09 (×2): qty 50

## 2011-03-09 MED ORDER — VANCOMYCIN HCL 1000 MG IV SOLR
750.0000 mg | INTRAVENOUS | Status: DC
  Administered 2011-03-12: 750 mg via INTRAVENOUS
  Filled 2011-03-09: qty 750

## 2011-03-09 MED ORDER — PIPERACILLIN-TAZOBACTAM IN DEX 2-0.25 GM/50ML IV SOLN
2.2500 g | Freq: Three times a day (TID) | INTRAVENOUS | Status: DC
  Administered 2011-03-09 – 2011-03-13 (×12): 2.25 g via INTRAVENOUS
  Filled 2011-03-09 (×16): qty 50

## 2011-03-09 NOTE — Progress Notes (Signed)
Patient ID: Mary Wilcox, female   DOB: 02-03-1956, 55 y.o.   MRN: [id number] I was present at this session.  I have reviewed the session itself and made appropriate changes.  Conn Trombetta L 12/24/201211:13 AM

## 2011-03-09 NOTE — Progress Notes (Addendum)
ANTIBIOTIC CONSULT NOTE - INITIAL  Pharmacy Consult for vancomycin Indication: leukocytosis and ? UTI  Allergies  Allergen Reactions  . Prozac (Fluoxetine Hcl) Hives    Patient Measurements: Height: 5\' 1"  (154.9 cm) Weight: 171 lb 15.3 oz (78 kg) IBW/kg (Calculated) : 47.8  Adjusted Body Weight:  Vital Signs: Temp: 98.7 F (37.1 C) (12/24 1035) Temp src: Oral (12/24 1035) BP: 133/65 mmHg (12/24 1230) Pulse Rate: 112  (12/24 1230) Intake/Output from previous day: 12/23 0701 - 12/24 0700 In: 600 [P.O.:600] Out: 300 [Urine:300] Intake/Output from this shift: Total I/O In: 120 [P.O.:120] Out: -   Labs:  Basename 03/09/11 1120 03/09/11 0912 03/07/11 1330 03/07/11 0400  WBC -- 36.0* 38.6* 38.8*  HGB -- 7.0* 6.8* 6.8*  PLT -- 433* 497* 442*  LABCREA -- -- -- --  CREATININE 7.64* -- 6.72* 5.91*   Estimated Creatinine Clearance: 7.7 ml/min (by C-G formula based on Cr of 7.64).   Microbiology: Recent Results (from the past 720 hour(s))  URINE CULTURE     Status: Normal   Collection Time   03/04/11  9:36 PM      Component Value Range Status Comment   Specimen Description URINE, CLEAN CATCH   Final    Special Requests NONE   Final    Setup Time [id number]   Final    Colony Count NO GROWTH   Final    Culture NO GROWTH   Final    Report Status 03/05/2011 FINAL   Final   MRSA PCR SCREENING     Status: Normal   Collection Time   03/05/11  2:26 AM      Component Value Range Status Comment   MRSA by PCR NEGATIVE  NEGATIVE  Final   CULTURE, BLOOD (ROUTINE X 2)     Status: Normal (Preliminary result)   Collection Time   03/05/11  5:00 AM      Component Value Range Status Comment   Specimen Description BLOOD RIGHT HAND   Final    Special Requests BOTTLES DRAWN AEROBIC AND ANAEROBIC 5CC EACH   Final    Setup Time [id number]   Final    Culture     Final    Value:        BLOOD CULTURE RECEIVED NO GROWTH TO DATE CULTURE WILL BE HELD FOR 5 DAYS BEFORE ISSUING A FINAL  NEGATIVE REPORT   Report Status PENDING   Incomplete   CULTURE, BLOOD (ROUTINE X 2)     Status: Normal (Preliminary result)   Collection Time   03/05/11  5:15 AM      Component Value Range Status Comment   Specimen Description BLOOD LEFT HAND   Final    Special Requests     Final    Value: BOTTLES DRAWN AEROBIC AND ANAEROBIC 10CC AEROBIC [place] ANAEROBIC   Setup Time [id number]   Final    Culture     Final    Value:        BLOOD CULTURE RECEIVED NO GROWTH TO DATE CULTURE WILL BE HELD FOR 5 DAYS BEFORE ISSUING A FINAL NEGATIVE REPORT   Report Status PENDING   Incomplete     Medical History: Past Medical History  Diagnosis Date  . Chronic kidney disease   . Thyroid disorder   . Cancer     cervical ca 2006    Medications:  Scheduled:    . darbepoetin (ARANESP) injection - DIALYSIS  200 mcg Intravenous Q Thu-HD  . ferric glucontate (NULECIT) IV  62.5 mg Intravenous Weekly  . multivitamin  1 tablet Oral Daily  . piperacillin-tazobactam (ZOSYN)  IV  3.375 g Intravenous Q8H  . DISCONTD: piperacillin-tazo (ZOSYN) NICU IV syringe 200 mg/mL  75 mg/kg Intravenous Q8H  . DISCONTD: piperacillin-tazo (ZOSYN) NICU IV syringe 200 mg/mL  75 mg/kg Intravenous Q8H   Assessment: Admit Complaint:abnormal labs Pharmacist System-Based Medication Review: Anticoagulation: N/A.  Infectious Disease: Admitted with leukocytosis, given rocephin 1gm x1 in ED (MD note states that patient received 5 day course which she has not). Urine cx and blood cx = NG. Tm=99.9, WBC=36.  Appears has h/o recurrent UTIs. Started on zosyn and vancomycin today.   Cardiovascular: VSS, no previous cardiac history Endocrinology: No history of DM, hyperparathyroid, corr ca=10.64, phos=5.9 Gastrointestinal / Nutrition: persistent nausea, renal diet Neurology: WNL Nephrology: CKD, new start to HD 12/20, being dialyzed today. Admitted with metabolic acidosis, improved. K=2.5, Phos=5.9 Pulmonary: 93% RA Hematology / Oncology: h/o  cervical cancer. On IV iron qHD and aranesp 280mcg qthur for anemia CKD, Hgb<7, plts=433 Best practices: on SCD, GI prophylaxis not indicated  Goal of Therapy:  Pre-HD level 15-28mcg/ml  Plan:  1. Vancomycin 1500mg  IV x1 after HD today then 750mg  IV qHD TTS 2. Zosyn dose needs adjusted to 2.25gm IV q8h for HD.   Clovis Riley 03/09/2011,1:00 PM

## 2011-03-09 NOTE — Progress Notes (Signed)
CRITICAL VALUE ALERT  Critical value received:  **potassium 2.5*  Date of notification:  03/09/11  Time of notification:  Telephone   Critical value read back:yes  Nurse who received alert:  Katharine Look Amador Braddy, rn  MD notified (1st page):  Amalia Hailey, pac   Time of first page:  1212  MD notified (2nd page):  Time of second page:  Responding MD:  Amalia Hailey, pac  Time MD responded:  [phone]

## 2011-03-09 NOTE — Progress Notes (Signed)
Subjective: Patient still complaining of left arm pain. She denies cough, diarrhea, dyspnea, abdominal pain.  Objective: Filed Vitals:   03/09/11 1050 03/09/11 1100 03/09/11 1130 03/09/11 1200  BP: 124/63 125/68 131/60 113/41  Pulse: 89 92 102 105  Temp:      TempSrc:      Resp: 18 14 16 16   Height:      Weight:      SpO2:       Weight change:   Intake/Output Summary (Last 24 hours) at 03/09/11 1232 Last data filed at 03/09/11 0844  Gross per 24 hour  Intake    600 ml  Output    300 ml  Net    300 ml    General: Alert, awake, oriented x3, in no acute distress.  HEENT: No bruits, no goiter.  Heart: Regular rate and rhythm, without murmurs, rubs, gallops.  Lungs: Crackles left side, bilateral air movement.  Abdomen: Soft, nontender, nondistended, positive bowel sounds.  Neuro: Grossly intact, nonfocal. Extremity: Left upper extremity fistula site with swelling, has blister, hematoma.   Lab Results:  Basename 03/09/11 1120 03/07/11 1330  NA 133* 137  K 2.5* 3.1*  CL 92* 94*  CO2 22 23  GLUCOSE 137* 114*  BUN 63* 68*  CREATININE 7.64* 6.72*  CALCIUM 9.2 10.1  MG -- --  PHOS 5.9* 6.3*    Basename 03/09/11 1120 03/07/11 1330  AST 32 --  ALT 19 --  ALKPHOS 98 --  BILITOT 0.2* --  PROT 7.1 --  ALBUMIN 2.2* 2.8*   Basename 03/09/11 0912 03/07/11 1330  WBC 36.0* 38.6*  NEUTROABS -- --  HGB 7.0* 6.8*  HCT 21.8* 20.4*  MCV 96.0 92.3  PLT 433* 497*    Micro Results: Recent Results (from the past 240 hour(s))  URINE CULTURE     Status: Normal   Collection Time   03/04/11  9:36 PM      Component Value Range Status Comment   Specimen Description URINE, CLEAN CATCH   Final    Special Requests NONE   Final    Setup Time [id number]   Final    Colony Count NO GROWTH   Final    Culture NO GROWTH   Final    Report Status 03/05/2011 FINAL   Final   MRSA PCR SCREENING     Status: Normal   Collection Time   03/05/11  2:26 AM      Component Value Range Status  Comment   MRSA by PCR NEGATIVE  NEGATIVE  Final   CULTURE, BLOOD (ROUTINE X 2)     Status: Normal (Preliminary result)   Collection Time   03/05/11  5:00 AM      Component Value Range Status Comment   Specimen Description BLOOD RIGHT HAND   Final    Special Requests BOTTLES DRAWN AEROBIC AND ANAEROBIC 5CC EACH   Final    Setup Time [id number]   Final    Culture     Final    Value:        BLOOD CULTURE RECEIVED NO GROWTH TO DATE CULTURE WILL BE HELD FOR 5 DAYS BEFORE ISSUING A FINAL NEGATIVE REPORT   Report Status PENDING   Incomplete   CULTURE, BLOOD (ROUTINE X 2)     Status: Normal (Preliminary result)   Collection Time   03/05/11  5:15 AM      Component Value Range Status Comment   Specimen Description BLOOD LEFT HAND   Final  Special Requests     Final    Value: BOTTLES DRAWN AEROBIC AND ANAEROBIC 10CC AEROBIC [place] ANAEROBIC   Setup Time [id number]   Final    Culture     Final    Value:        BLOOD CULTURE RECEIVED NO GROWTH TO DATE CULTURE WILL BE HELD FOR 5 DAYS BEFORE ISSUING A FINAL NEGATIVE REPORT   Report Status PENDING   Incomplete     Studies/Results: No results found.  Medications: I have reviewed the patient's current medications.   Patient Active Hospital Problem List:  Chronic kidney disease, stage V requiring chronic dialysis (03/05/2011) Now new dialysis patient. Electrolytes abnormalities will need to be corrected with dialysis.   Metabolic acidosis ([id number]) Improved.  Likely secondary to renal failure.   Hydronephrosis, bilateral (03/05/2011) Renal ultrasound  Done 12-21 showed Severely echogenic kidneys bilaterally with cortical thinning  compatible with chronic medical renal disease.  Right ureteral stent visualized.  No hydronephrosis.  Leukocytosis (03/05/2011): Patient received 5 days of ceftriaxone for UTI ? Procalcitonin  level high . She denies cough diarrhea. A chest x-ray was negative for infiltrates. Blood cultures  obtained  12-20 no growth to date. Urine culture  obtain on 12-19 no growth.   Concern for left upper extremity  Graft  infection, patient has a blister in the skin. I will start empirically vancomycin and Zosyn. I will consult vascular for further evaluation. If this is not the site of infection will consider CT scan. I will check peripheral smear. Anemia associated with chronic renal failure (03/06/2011)  HB today at 7. 6 month ago was at 10 . Continue with ararnesp, IV iron. I will check guaiac stool.  Thrombocytosis (03/06/2011) probably reactive secondary to infection .      LOS: 5 days   Norell Brisbin M.D.  [facility] 03/09/2011, 12:32 PM

## 2011-03-09 NOTE — Progress Notes (Signed)
Patient ID: Mary Wilcox, female   DOB: 20-Jun-1955, 55 y.o.   MRN: [id number] [facility]  Subjective  - POD #4 Left upper arm AV graft. Called to see pt to examine site of graft as possible source of leukocytosis.  Pt had WBC 31k on admission and has persistantly been greater than 30K was 36k today.  She has been afebrile.  She complains of some left arm pain but this has been slowly improving.  Objective 121/65 98 98.7 F (37.1 C) (Oral) 18 93%  Intake/Output Summary (Last 24 hours) at 03/09/11 1523 Last data filed at 03/09/11 1300  Gross per 24 hour  Intake    360 ml  Output      0 ml  Net    360 ml   Left upper arm loop graft + thrill, diffuse ecchymosis, blister 2 x 3 cm adjacent to medial aspect of antecubital incision, left hand warm motor intact sl decreased sensation in digits  Assessment/Planning: No signs of graft infection at this point, pt does have leukocytosis but this has been present since prior to graft placement. Graft currently has appearance of recent graft placement rather than infection I do not believe blister represents infection but rather appears more like a tape blister  Xeroform gauze and Kerlix to protect arm daily dressing change Consider ID or Hematology consult if no other source for her leukocytosis  Jabier Deese E 03/09/2011 3:23 PM --  Laboratory Lab Results:  [facility] 03/09/11 0912 03/07/11 1330  WBC 36.0* 38.6*  HGB 7.0* 6.8*  HCT 21.8* 20.4*  PLT 433* 497*   BMET  Basename 03/09/11 1120 03/07/11 1330  NA 133* 137  K 2.5* 3.1*  CL 92* 94*  CO2 22 23  GLUCOSE 137* 114*  BUN 63* 68*  CREATININE 7.64* 6.72*  CALCIUM 9.2 10.1    COAG Lab Results  Component Value Date   INR 1.27 03/05/2011   INR 1.30 03/04/2011   No results found for this basename: PTT    Antibiotics Anti-infectives     Start     Dose/Rate Route Frequency Ordered Stop   03/12/11 1200   vancomycin (VANCOCIN) 750 mg  in sodium chloride 0.9 % 150 mL IVPB        750 mg 150 mL/hr over 60 Minutes Intravenous Every T-Th-Sa (Hemodialysis) 03/09/11 1319     03/09/11 1430   vancomycin (VANCOCIN) 1,500 mg in sodium chloride 0.9 % 500 mL IVPB        1,500 mg 250 mL/hr over 120 Minutes Intravenous  Once 03/09/11 1319     03/09/11 1400   piperacillin-tazobactam (ZOSYN) IVPB 3.375 g  Status:  Discontinued        3.375 g 12.5 mL/hr over 240 Minutes Intravenous 3 times per day 03/09/11 1235 03/09/11 1320   03/09/11 1400  piperacillin-tazobactam (ZOSYN) IVPB 2.25 g       2.25 g 100 mL/hr over 30 Minutes Intravenous Every 8 hours 03/09/11 1321     03/09/11 1245   piperacillin-tazo (ZOSYN) NICU IV syringe 200 mg/mL  Status:  Discontinued        75 mg/kg  78 kg 58.6 mL/hr over 30 Minutes Intravenous Every 8 hours 03/09/11 1235 03/09/11 1236   03/09/11 1230   piperacillin-tazo (ZOSYN) NICU IV syringe 200 mg/mL  Status:  Discontinued        75 mg/kg  78 kg 58.6 mL/hr over 30 Minutes Intravenous Every 8 hours 03/09/11 1228 03/09/11 1235  03/05/11 0015   cefTRIAXone (ROCEPHIN) 1 g in dextrose 5 % 50 mL IVPB        1 g 100 mL/hr over 30 Minutes Intravenous  Once 03/05/11 0005 03/05/11 0142

## 2011-03-09 NOTE — Progress Notes (Signed)
Subjective: Interval History: has complaints hand still numb.  Objective: Vital signs in last 24 hours: Temp:  [98 F (36.7 C)-99.4 F (37.4 C)] 99.4 F (37.4 C) (12/24 0946) Pulse Rate:  [71-90] 81  (12/24 0946) Resp:  [17-24] 18  (12/24 0946) BP: (108-138)/(57-83) 118/80 mmHg (12/24 0946) SpO2:  [91 %-98 %] 91 % (12/24 0946) Weight change:   Intake/Output from previous day: 12/23 0701 - 12/24 0700 In: 600 [P.O.:600] Out: 300 [Urine:300] Intake/Output this shift: Total I/O In: 120 [P.O.:120] Out: -   General appearance: alert, cooperative and pale Resp: clear to auscultation bilaterally Cardio: S1, S2 normal, S4 present and systolic murmur: holosystolic 2/6, blowing at apex GI: liver sown 4 cm pos bs soft Extremities: avg LUA B & T, full move ment L hand  Lab Results:  Basename 03/07/11 1330 03/07/11 0400  WBC 38.6* 38.8*  HGB 6.8* 6.8*  HCT 20.4* 20.2*  PLT 497* 442*   BMET:  Basename 03/07/11 1330 03/07/11 0400  NA 137 138  K 3.1* 3.3*  CL 94* 97  CO2 23 22  GLUCOSE 114* 108*  BUN 68* 58*  CREATININE 6.72* 5.91*  CALCIUM 10.1 9.7   No results found for this basename: PTH:2 in the last 72 hours Iron Studies: No results found for this basename: IRON,TIBC,TRANSFERRIN,FERRITIN in the last 72 hours  Studies/Results: No results found.  I have reviewed the patient's current medications.  Assessment/Plan: 1 ESRD vol ok, for HD,  CLIP, 2 Anemia May need tx, epo/fe 3HPTH stable 4 nonadherence  P HD, CLIP     pTH    LOS: 5 days   Demarques Pilz L 03/09/2011,9:51 AM

## 2011-03-10 LAB — CBC
HCT: 17.9 % — ABNORMAL LOW (ref 36.0–46.0)
HCT: 23.7 % — ABNORMAL LOW (ref 36.0–46.0)
Hemoglobin: 5.7 g/dL — CL (ref 12.0–15.0)
Hemoglobin: 7.8 g/dL — ABNORMAL LOW (ref 12.0–15.0)
MCH: 31.3 pg (ref 26.0–34.0)
MCH: 30.2 pg (ref 26.0–34.0)
MCHC: 31.8 g/dL (ref 30.0–36.0)
MCHC: 32.9 g/dL (ref 30.0–36.0)
MCV: 98.4 fL (ref 78.0–100.0)
MCV: 91.9 fL (ref 78.0–100.0)
Platelets: 318 10*3/uL (ref 150–400)
Platelets: 298 10*3/uL (ref 150–400)
RBC: 1.82 MIL/uL — ABNORMAL LOW (ref 3.87–5.11)
RBC: 2.58 MIL/uL — ABNORMAL LOW (ref 3.87–5.11)
RDW: 15.1 % (ref 11.5–15.5)
RDW: 17.3 % — ABNORMAL HIGH (ref 11.5–15.5)
WBC: 28.9 10*3/uL — ABNORMAL HIGH (ref 4.0–10.5)
WBC: 26.5 10*3/uL — ABNORMAL HIGH (ref 4.0–10.5)

## 2011-03-10 LAB — RENAL FUNCTION PANEL
Albumin: 1.9 g/dL — ABNORMAL LOW (ref 3.5–5.2)
BUN: 33 mg/dL — ABNORMAL HIGH (ref 6–23)
CO2: 28 mEq/L (ref 19–32)
Calcium: 8.5 mg/dL (ref 8.4–10.5)
Chloride: 92 mEq/L — ABNORMAL LOW (ref 96–112)
Creatinine, Ser: 5.1 mg/dL — ABNORMAL HIGH (ref 0.50–1.10)
GFR calc Af Amer: 10 mL/min — ABNORMAL LOW (ref >90)
GFR calc non Af Amer: 9 mL/min — ABNORMAL LOW (ref >90)
Glucose, Bld: 99 mg/dL (ref 70–99)
Phosphorus: 4.5 mg/dL (ref 2.3–4.6)
Potassium: 3.7 mEq/L (ref 3.5–5.1)
Sodium: 135 mEq/L (ref 135–145)

## 2011-03-10 LAB — BASIC METABOLIC PANEL
BUN: 24 mg/dL — ABNORMAL HIGH (ref 6–23)
CO2: 28 mEq/L (ref 19–32)
Calcium: 8.7 mg/dL (ref 8.4–10.5)
Chloride: 94 mEq/L — ABNORMAL LOW (ref 96–112)
Creatinine, Ser: 4.12 mg/dL — ABNORMAL HIGH (ref 0.50–1.10)
GFR calc Af Amer: 13 mL/min — ABNORMAL LOW (ref >90)
GFR calc non Af Amer: 11 mL/min — ABNORMAL LOW (ref >90)
Glucose, Bld: 115 mg/dL — ABNORMAL HIGH (ref 70–99)
Potassium: 3.5 mEq/L (ref 3.5–5.1)
Sodium: 136 mEq/L (ref 135–145)

## 2011-03-10 LAB — ABO/RH: ABO/RH(D): AB POS

## 2011-03-10 LAB — PREPARE RBC (CROSSMATCH)

## 2011-03-10 MED ORDER — CINACALCET HCL 30 MG PO TABS
60.0000 mg | ORAL_TABLET | ORAL | Status: DC
  Administered 2011-03-10 – 2011-03-15 (×6): 60 mg via ORAL
  Filled 2011-03-10 (×6): qty 2

## 2011-03-10 MED ORDER — ONDANSETRON HCL 4 MG/2ML IJ SOLN
4.0000 mg | Freq: Four times a day (QID) | INTRAMUSCULAR | Status: DC | PRN
  Administered 2011-03-10: 4 mg via INTRAVENOUS
  Filled 2011-03-10: qty 2

## 2011-03-10 MED ORDER — PARICALCITOL 5 MCG/ML IV SOLN
4.0000 ug | INTRAVENOUS | Status: DC
  Administered 2011-03-12: 4 ug via INTRAVENOUS
  Filled 2011-03-10 (×3): qty 0.8

## 2011-03-10 NOTE — Progress Notes (Signed)
Subjective: Patient relate that the left arm pain is better. She denies chest pain, shortness of breath, diarrhea, abdominal pain. Objective: Filed Vitals:   03/10/11 1008 03/10/11 1055 03/10/11 1140 03/10/11 1336  BP: 109/79 121/79 108/72 111/73  Pulse: 92 95 90 93  Temp: 98.6 F (37 C) 98.7 F (37.1 C) 98.9 F (37.2 C) 98.5 F (36.9 C)  TempSrc: Oral Oral  Oral  Resp: 18 20 18 18   Height:      Weight:      SpO2: 97%   96%   Weight change:   Intake/Output Summary (Last 24 hours) at 03/10/11 1417 Last data filed at 03/10/11 1235  Gross per 24 hour  Intake   1010 ml  Output    475 ml  Net    535 ml    General: Alert, awake, oriented x3, in no acute distress.  HEENT: No bruits, no goiter.  Heart: Regular rate and rhythm, without murmurs, rubs, gallops.  Lungs: Clear to auscultation, bilateral air movement.  Abdomen: Soft, nontender, nondistended, positive bowel sounds.  Neuro: Grossly intact, nonfocal. Extremities: left arm , graft with dressing.    Lab Results:  Basename 03/10/11 0500 03/09/11 1120  NA 136 133*  K 3.5 2.5*  CL 94* 92*  CO2 28 22  GLUCOSE 115* 137*  BUN 24* 63*  CREATININE 4.12* 7.64*  CALCIUM 8.7 9.2  MG -- --  PHOS -- 5.9*    Basename 03/09/11 1120  AST 32  ALT 19  ALKPHOS 98  BILITOT 0.2*  PROT 7.1  ALBUMIN 2.2*    Basename 03/10/11 0500 03/09/11 0912  WBC 28.9* 36.0*  NEUTROABS -- --  HGB 5.7* 7.0*  HCT 17.9* 21.8*  MCV 98.4 96.0  PLT 318 433*    Micro Results: Recent Results (from the past 240 hour(s))  URINE CULTURE     Status: Normal   Collection Time   03/04/11  9:36 PM      Component Value Range Status Comment   Specimen Description URINE, CLEAN CATCH   Final    Special Requests NONE   Final    Setup Time [id number]   Final    Colony Count NO GROWTH   Final    Culture NO GROWTH   Final    Report Status 03/05/2011 FINAL   Final   MRSA PCR SCREENING     Status: Normal   Collection Time   03/05/11  2:26 AM    Component Value Range Status Comment   MRSA by PCR NEGATIVE  NEGATIVE  Final   CULTURE, BLOOD (ROUTINE X 2)     Status: Normal (Preliminary result)   Collection Time   03/05/11  5:00 AM      Component Value Range Status Comment   Specimen Description BLOOD RIGHT HAND   Final    Special Requests BOTTLES DRAWN AEROBIC AND ANAEROBIC 5CC EACH   Final    Setup Time [id number]   Final    Culture     Final    Value:        BLOOD CULTURE RECEIVED NO GROWTH TO DATE CULTURE WILL BE HELD FOR 5 DAYS BEFORE ISSUING A FINAL NEGATIVE REPORT   Report Status PENDING   Incomplete   CULTURE, BLOOD (ROUTINE X 2)     Status: Normal (Preliminary result)   Collection Time   03/05/11  5:15 AM      Component Value Range Status Comment   Specimen Description BLOOD LEFT HAND   Final  Special Requests     Final    Value: BOTTLES DRAWN AEROBIC AND ANAEROBIC 10CC AEROBIC [place] ANAEROBIC   Setup Time [id number]   Final    Culture     Final    Value:        BLOOD CULTURE RECEIVED NO GROWTH TO DATE CULTURE WILL BE HELD FOR 5 DAYS BEFORE ISSUING A FINAL NEGATIVE REPORT   Report Status PENDING   Incomplete     Studies/Results: No results found.  Medications: I have reviewed the patient's current medications.  Patient Active Hospital Problem List:   Chronic kidney disease, stage V requiring chronic dialysis (03/05/2011) New dialysis patient.  Had dialysis 12-24.  k today at 3.5.  Metabolic acidosis ([id number])  Resolved.   Likely secondary to renal failure.   Hydronephrosis, bilateral (03/05/2011)  Renal ultrasound Done 12-21 showed  Severely echogenic kidneys bilaterally with cortical thinning  compatible with chronic medical renal disease.  Right ureteral stent visualized.  No hydronephrosis.  Leukocytosis (03/05/2011):  Patient received 5 days of ceftriaxone for UTI.   Procalcitonin level high . She denies cough, diarrhea. A chest x-ray was negative for infiltrates. Blood cultures obtained  12-20 no growth to date. Urine culture obtain on 12-19 no growth. Graft reviewed by vascular, no evidence of graft infection.  Repeated UA on 12-25 wit too numerous to count WBC, will follow culture.  Vancomycin and Zosyn started 12-24, day 2.  WBC decrease to 28. I will order Peripheral smear for second time..  Anemia associated with chronic renal failure (03/06/2011) Hb 61month ago was at 10 . Continue with ararnesp, IV iron.  guaiac stool pending. Transfused 2 unit. Blood loss during dialysis treatment yesterday. Bili Nl unlikely hemolysis. Anemia panel 12-20 showed: Iron at 73, saturation ratio 39,  ferritin at 1188. Anemia of chronic disease.    Thrombocytosis (03/06/2011) probably reactive secondary to infection .        LOS: 6 days   Persephanie Laatsch M.D.  [facility] 03/10/2011, 2:17 PM

## 2011-03-10 NOTE — Progress Notes (Signed)
Subjective: Interval History: none.  Objective: Vital signs in last 24 hours: Temp:  [98 F (36.7 C)-99.3 F (37.4 C)] 98.6 F (37 C) (12/25 1008) Pulse Rate:  [92-112] 92  (12/25 1008) Resp:  [16-25] 18  (12/25 1008) BP: (106-139)/(41-80) 109/79 mmHg (12/25 1008) SpO2:  [93 %-97 %] 97 % (12/25 1008) Weight:  [77.6 kg (171 lb 1.2 oz)-78 kg (171 lb 15.3 oz)] 171 lb 1.2 oz (77.6 kg) (12/24 2130) Weight change:   Intake/Output from previous day: 12/24 0701 - 12/25 0700 In: 170 [P.O.:120; IV Piggyback:50] Out: 475  Intake/Output this shift: Total I/O In: 120 [P.O.:120] Out: -   General appearance: alert, cooperative and pale Resp: clear to auscultation bilaterally Cardio: S1, S2 normal and systolic murmur: systolic ejection 2/6, decrescendo at 2nd left intercostal space GI: soft, pos bs, liver down 4 cm Extremities: AVG LUA, nonerythematous, nontender.  Lab Results:  Basename 03/10/11 0500 03/09/11 0912  WBC 28.9* 36.0*  HGB 5.7* 7.0*  HCT 17.9* 21.8*  PLT 318 433*   BMET:  Basename 03/10/11 0500 03/09/11 1120  NA 136 133*  K 3.5 2.5*  CL 94* 92*  CO2 28 22  GLUCOSE 115* 137*  BUN 24* 63*  CREATININE 4.12* 7.64*  CALCIUM 8.7 9.2   No results found for this basename: PTH:2 in the last 72 hours Iron Studies: No results found for this basename: IRON,TIBC,TRANSFERRIN,FERRITIN in the last 72 hours  Studies/Results: No results found.  I have reviewed the patient's current medications.  Assessment/Plan: 1 ESRD HD TTS vol ok 2 Anemia lost blood yest with clotting of system for TX , limit to 1 unit and follow vol & K. 3 ^ wBC Present on admit, ? Cause .  Culture neg pyuria 4 HPTH give Vit D and Sensipar.  p as above     LOS: 6 days   Sissi Padia L 03/10/2011,11:08 AM

## 2011-03-11 LAB — COMPREHENSIVE METABOLIC PANEL
ALT: 76 U/L — ABNORMAL HIGH (ref 0–35)
AST: 156 U/L — ABNORMAL HIGH (ref 0–37)
Albumin: 1.8 g/dL — ABNORMAL LOW (ref 3.5–5.2)
Alkaline Phosphatase: 773 U/L — ABNORMAL HIGH (ref 39–117)
BUN: 48 mg/dL — ABNORMAL HIGH (ref 6–23)
CO2: 23 mEq/L (ref 19–32)
Calcium: 7.8 mg/dL — ABNORMAL LOW (ref 8.4–10.5)
Chloride: 92 mEq/L — ABNORMAL LOW (ref 96–112)
Creatinine, Ser: 6.46 mg/dL — ABNORMAL HIGH (ref 0.50–1.10)
GFR calc Af Amer: 7 mL/min — ABNORMAL LOW (ref >90)
GFR calc non Af Amer: 6 mL/min — ABNORMAL LOW (ref >90)
Glucose, Bld: 98 mg/dL (ref 70–99)
Potassium: 3.9 mEq/L (ref 3.5–5.1)
Sodium: 133 mEq/L — ABNORMAL LOW (ref 135–145)
Total Bilirubin: 0.4 mg/dL (ref 0.3–1.2)
Total Protein: 6.5 g/dL (ref 6.0–8.3)

## 2011-03-11 LAB — DIFFERENTIAL
Basophils Absolute: 0 10*3/uL (ref 0.0–0.1)
Basophils Relative: 0 % (ref 0–1)
Eosinophils Absolute: 0.2 10*3/uL (ref 0.0–0.7)
Eosinophils Relative: 1 % (ref 0–5)
Lymphocytes Relative: 8 % — ABNORMAL LOW (ref 12–46)
Lymphs Abs: 2 10*3/uL (ref 0.7–4.0)
Monocytes Absolute: 2 10*3/uL — ABNORMAL HIGH (ref 0.1–1.0)
Monocytes Relative: 8 % (ref 3–12)
Neutro Abs: 20.6 10*3/uL — ABNORMAL HIGH (ref 1.7–7.7)
Neutrophils Relative %: 83 % — ABNORMAL HIGH (ref 43–77)

## 2011-03-11 LAB — URINE MICROSCOPIC-ADD ON

## 2011-03-11 LAB — CULTURE, BLOOD (ROUTINE X 2)
Culture  Setup Time: 201212200823
Culture  Setup Time: 201212200823
Culture: NO GROWTH
Culture: NO GROWTH

## 2011-03-11 LAB — URINALYSIS, ROUTINE W REFLEX MICROSCOPIC
Bilirubin Urine: NEGATIVE
Glucose, UA: NEGATIVE mg/dL
Ketones, ur: NEGATIVE mg/dL
Nitrite: NEGATIVE
Protein, ur: >300 mg/dL — AB
Specific Gravity, Urine: 1.01 (ref 1.005–1.030)
Urobilinogen, UA: 0.2 mg/dL (ref 0.0–1.0)
pH: 7.5 (ref 5.0–8.0)

## 2011-03-11 LAB — CBC
HCT: 23.2 % — ABNORMAL LOW (ref 36.0–46.0)
Hemoglobin: 7.7 g/dL — ABNORMAL LOW (ref 12.0–15.0)
MCH: 30.3 pg (ref 26.0–34.0)
MCHC: 33.2 g/dL (ref 30.0–36.0)
MCV: 91.3 fL (ref 78.0–100.0)
Platelets: ADEQUATE 10*3/uL (ref 150–400)
RBC: 2.54 MIL/uL — ABNORMAL LOW (ref 3.87–5.11)
RDW: 18 % — ABNORMAL HIGH (ref 11.5–15.5)
WBC: 24.8 10*3/uL — ABNORMAL HIGH (ref 4.0–10.5)

## 2011-03-11 LAB — OCCULT BLOOD X 1 CARD TO LAB, STOOL: Fecal Occult Bld: NEGATIVE

## 2011-03-11 LAB — PHOSPHORUS: Phosphorus: 4.9 mg/dL — ABNORMAL HIGH (ref 2.3–4.6)

## 2011-03-11 MED ORDER — HEPARIN SODIUM (PORCINE) 1000 UNIT/ML DIALYSIS: 1000.0000 [IU] | INTRAMUSCULAR | Status: DC | PRN

## 2011-03-11 MED ORDER — LIDOCAINE HCL (PF) 1 % IJ SOLN: 5.0000 mL | INTRAMUSCULAR | Status: DC | PRN

## 2011-03-11 MED ORDER — HEPARIN SODIUM (PORCINE) 1000 UNIT/ML DIALYSIS
100.0000 [IU]/kg | INTRAMUSCULAR | Status: DC | PRN
  Administered 2011-03-12: 8000 [IU] via INTRAVENOUS_CENTRAL
  Filled 2011-03-11: qty 8

## 2011-03-11 MED ORDER — SODIUM CHLORIDE 0.9 % IV SOLN: 100.0000 mL | INTRAVENOUS | Status: DC | PRN

## 2011-03-11 MED ORDER — LIDOCAINE-PRILOCAINE 2.5-2.5 % EX CREA: 1.0000 "application " | TOPICAL_CREAM | CUTANEOUS | Status: DC | PRN

## 2011-03-11 MED ORDER — POLYETHYLENE GLYCOL 3350 17 G PO PACK
17.0000 g | PACK | Freq: Two times a day (BID) | ORAL | Status: DC
  Administered 2011-03-11 – 2011-03-12 (×2): 17 g via ORAL
  Filled 2011-03-11 (×10): qty 1

## 2011-03-11 MED ORDER — NEPRO/CARBSTEADY PO LIQD: 237.0000 mL | ORAL | Status: DC | PRN

## 2011-03-11 MED ORDER — ALTEPLASE 2 MG IJ SOLR
2.0000 mg | Freq: Once | INTRAMUSCULAR | Status: AC | PRN
  Filled 2011-03-11: qty 2

## 2011-03-11 MED ORDER — PENTAFLUOROPROP-TETRAFLUOROETH EX AERO: 1.0000 "application " | INHALATION_SPRAY | CUTANEOUS | Status: DC | PRN

## 2011-03-11 NOTE — Progress Notes (Signed)
Subjective: 55 year old with past medical history significant for chronic kidney disease, history of Chronic hydronephrosis, ureteral Stent who was admitted to the hospital on December 20 after being referred by her nephrologist to the emergency department for abnormal lab work. Her creatinine baseline is usually in the 7 range, her creatinine  the day of admission was at 16. Patient was admitted with progression renal failure and metabolic acidosis. She was also found to have leukocytosis at 35. - She underwent placement of a Diatek and began dialysis treatments urgently on 03/05/2011.  The nephrologist consultation note mentions the etiology of this patient's chronic kidney disease is related to bilateral hydronephrosis and reflux nephropathy. A renal ultrasound has been ordered his admission and is consistent with medical-renal disease, negative for hydronephrosis.  -She also has history of  recurrent  UTIs. She has been tolerating dialysis and will be set up for outpt dialysis.    Patient feeling okay relate that of left arm pain has improved. Denies cough, diarrhea, chest pain.  Objective: Filed Vitals:   03/10/11 2202 03/11/11 0610 03/11/11 0909 03/11/11 1320  BP: 119/70 120/70 128/94 135/93  Pulse: 83 88 96 88  Temp: 98.7 F (37.1 C) 98.4 F (36.9 C) 98.9 F (37.2 C) 98.8 F (37.1 C)  TempSrc: Oral Oral Oral Oral  Resp: 19 18 17 17   Height:      Weight: 80.3 kg (177 lb 0.5 oz)     SpO2: 96% 98% 96% 95%   Weight change: 2.3 kg (5 lb 1.1 oz)  Intake/Output Summary (Last 24 hours) at 03/11/11 1640 Last data filed at 03/11/11 1321  Gross per 24 hour  Intake    440 ml  Output    100 ml  Net    340 ml    General: Alert, awake, oriented x3, in no acute distress.  HEENT: No bruits, no goiter.  Heart: Regular rate and rhythm, without murmurs, rubs, gallops.  Lungs: Crackles left side, bilateral air movement.  Abdomen: Soft, nontender, nondistended, positive bowel sounds.  Neuro:  Grossly intact, nonfocal. Extremities: left upper extremity, graft with dressing. LE no edema.   Lab Results:  Basename 03/11/11 0600 03/10/11 1513  NA 133* 135  K 3.9 3.7  CL 92* 92*  CO2 23 28  GLUCOSE 98 99  BUN 48* 33*  CREATININE 6.46* 5.10*  CALCIUM 7.8* 8.5  MG -- --  PHOS 4.9* 4.5    Basename 03/11/11 0600 03/10/11 1513 03/09/11 1120  AST 156* -- 32  ALT 76* -- 19  ALKPHOS 773* -- 98  BILITOT 0.4 -- 0.2*  PROT 6.5 -- 7.1  ALBUMIN 1.8* 1.9* --    Basename 03/11/11 0600 03/10/11 1512  WBC 24.8* 26.5*  NEUTROABS 20.6* --  HGB 7.7* 7.8*  HCT 23.2* 23.7*  MCV 91.3 91.9  PLT PLATELET CLUMPS NOTED ON SMEAR, COUNT APPEARS ADEQUATE 298    Micro Results: Recent Results (from the past 240 hour(s))  URINE CULTURE     Status: Normal   Collection Time   03/04/11  9:36 PM      Component Value Range Status Comment   Specimen Description URINE, CLEAN CATCH   Final    Special Requests NONE   Final    Setup Time [id number]   Final    Colony Count NO GROWTH   Final    Culture NO GROWTH   Final    Report Status 03/05/2011 FINAL   Final   MRSA PCR SCREENING  Status: Normal   Collection Time   03/05/11  2:26 AM      Component Value Range Status Comment   MRSA by PCR NEGATIVE  NEGATIVE  Final   CULTURE, BLOOD (ROUTINE X 2)     Status: Normal   Collection Time   03/05/11  5:00 AM      Component Value Range Status Comment   Specimen Description BLOOD RIGHT HAND   Final    Special Requests BOTTLES DRAWN AEROBIC AND ANAEROBIC [facility]   Final    Setup Time [id number]   Final    Culture NO GROWTH 5 DAYS   Final    Report Status 03/11/2011 FINAL   Final   CULTURE, BLOOD (ROUTINE X 2)     Status: Normal   Collection Time   03/05/11  5:15 AM      Component Value Range Status Comment   Specimen Description BLOOD LEFT HAND   Final    Special Requests     Final    Value: BOTTLES DRAWN AEROBIC AND ANAEROBIC 10CC AEROBIC [place] ANAEROBIC   Setup Time [id number]   Final      Culture NO GROWTH 5 DAYS   Final    Report Status 03/11/2011 FINAL   Final   URINE CULTURE     Status: Normal (Preliminary result)   Collection Time   03/09/11  8:17 PM      Component Value Range Status Comment   Specimen Description URINE, CLEAN CATCH   Final    Special Requests NONE   Final    Setup Time [id number]   Final    Colony Count >=100,000 COLONIES/ML   Final    Culture GRAM NEGATIVE RODS   Final    Report Status PENDING   Incomplete     Studies/Results: No results found.  Medications: I have reviewed the patient's current medications.  Chronic kidney disease, stage V requiring chronic dialysis (03/05/2011) New dialysis patient.  Continue with Dialysis.  Metabolic acidosis ([id number])  Resolved.  Likely secondary to renal failure.   Hydronephrosis, bilateral (03/05/2011)  Renal ultrasound Done 12-21 showed :Severely echogenic kidneys bilaterally with cortical thinning compatible with chronic medical renal disease. Right ureteral stent visualized.  No hydronephrosis.  Leukocytosis (03/05/2011):  Patient received 5 days of ceftriaxone for UTI.  Procalcitonin level high . She denies cough, diarrhea. A chest x-ray was negative for infiltrates. Blood cultures obtained 12-20 no growth to date. Urine culture obtain on 12-19 no growth. Graft reviewed by vascular, no evidence of graft infection.  Repeated UA on 12-25 with too numerous to count WBC, will follow culture.  Vancomycin and Zosyn started 12-24, day 3.  WBC decreasing after started vancomycin and zosyn.  Today at  24. Peripheral Smear Pending.  If WBC continue to increase consider ID/ Hematology consult.   Anemia associated with chronic renal failure (03/06/2011) Hb 20month ago was at 10 . Continue with ararnesp, IV iron. guaiac stool pending. SP Transfused 1 unit 12-25. Blood loss during dialysis treatment 12-24. Bili Nl unlikely hemolysis. Anemia panel 12-20 showed: Iron at 73, saturation ratio 39, ferritin  at 1188. Anemia of chronic disease.   Thrombocytosis (03/06/2011) probably reactive secondary to infection .        LOS: 7 days   REGALADO,BELKYS M.D.  [facility] 03/11/2011, 4:40 PM

## 2011-03-11 NOTE — Progress Notes (Signed)
Subjective: Interval History: has complaints constip.  Objective: Vital signs in last 24 hours: Temp:  [98.4 F (36.9 C)-98.9 F (37.2 C)] 98.9 F (37.2 C) (12/26 0909) Pulse Rate:  [83-96] 96  (12/26 0909) Resp:  [17-20] 17  (12/26 0909) BP: (108-129)/(70-94) 128/94 mmHg (12/26 0909) SpO2:  [96 %-98 %] 96 % (12/26 0909) Weight:  [80.3 kg (177 lb 0.5 oz)] 177 lb 0.5 oz (80.3 kg) (12/25 2202) Weight change: 2.3 kg (5 lb 1.1 oz)  Intake/Output from previous day: 12/25 0701 - 12/26 0700 In: 1080 [P.O.:480; I.V.:250; Blood:350] Out: -  Intake/Output this shift: Total I/O In: 200 [P.O.:200] Out: -   General appearance: alert, cooperative and pale Resp: clear to auscultation bilaterally Cardio: S1, S2 normal and systolic murmur: holosystolic 2/6, blowing at apex GI: fullness,pos bs, liver down 4 cm Extremities: LUA AVG, hand mildly cool, Normal function.  Lab Results:  Basename 03/11/11 0600 03/10/11 1512  WBC 24.8* 26.5*  HGB 7.7* 7.8*  HCT 23.2* 23.7*  PLT PLATELET CLUMPS NOTED ON SMEAR, COUNT APPEARS ADEQUATE 298   BMET:  Basename 03/11/11 0600 03/10/11 1513  NA 133* 135  K 3.9 3.7  CL 92* 92*  CO2 23 28  GLUCOSE 98 99  BUN 48* 33*  CREATININE 6.46* 5.10*  CALCIUM 7.8* 8.5   No results found for this basename: PTH:2 in the last 72 hours Iron Studies: No results found for this basename: IRON,TIBC,TRANSFERRIN,FERRITIN in the last 72 hours  Studies/Results: No results found.  I have reviewed the patient's current medications.  Assessment/Plan: 1 ESRD for HD,  2 Anemia EPO/fe 3 HPTH meds 4 Pyuria has stent and suspect is related. And suspect is cause of ^ WBC 5 Debill  P C & S, HD, EPO/fe   LOS: 7 days   Arayna Illescas L 03/11/2011,11:06 AM

## 2011-03-12 ENCOUNTER — Inpatient Hospital Stay (HOSPITAL_COMMUNITY): Payer: BC Managed Care – PPO

## 2011-03-12 LAB — CBC
HCT: 20.7 % — ABNORMAL LOW (ref 36.0–46.0)
Hemoglobin: 6.8 g/dL — CL (ref 12.0–15.0)
MCH: 30.1 pg (ref 26.0–34.0)
MCHC: 32.9 g/dL (ref 30.0–36.0)
MCV: 91.6 fL (ref 78.0–100.0)
Platelets: 248 10*3/uL (ref 150–400)
RBC: 2.26 MIL/uL — ABNORMAL LOW (ref 3.87–5.11)
RDW: 17.1 % — ABNORMAL HIGH (ref 11.5–15.5)
WBC: 5.5 10*3/uL (ref 4.0–10.5)

## 2011-03-12 LAB — COMPREHENSIVE METABOLIC PANEL
ALT: 57 U/L — ABNORMAL HIGH (ref 0–35)
AST: 58 U/L — ABNORMAL HIGH (ref 0–37)
Albumin: 1.7 g/dL — ABNORMAL LOW (ref 3.5–5.2)
Alkaline Phosphatase: 488 U/L — ABNORMAL HIGH (ref 39–117)
BUN: 61 mg/dL — ABNORMAL HIGH (ref 6–23)
CO2: 23 mEq/L (ref 19–32)
Calcium: 7 mg/dL — ABNORMAL LOW (ref 8.4–10.5)
Chloride: 91 mEq/L — ABNORMAL LOW (ref 96–112)
Creatinine, Ser: 7.8 mg/dL — ABNORMAL HIGH (ref 0.50–1.10)
GFR calc Af Amer: 6 mL/min — ABNORMAL LOW (ref >90)
GFR calc non Af Amer: 5 mL/min — ABNORMAL LOW (ref >90)
Glucose, Bld: 113 mg/dL — ABNORMAL HIGH (ref 70–99)
Potassium: 3.3 mEq/L — ABNORMAL LOW (ref 3.5–5.1)
Sodium: 134 mEq/L — ABNORMAL LOW (ref 135–145)
Total Bilirubin: 0.2 mg/dL — ABNORMAL LOW (ref 0.3–1.2)
Total Protein: 6.3 g/dL (ref 6.0–8.3)

## 2011-03-12 LAB — URINE CULTURE
Colony Count: >=100000
Colony Count: >=100000
Culture  Setup Time: 201212242102
Culture  Setup Time: 201212261405

## 2011-03-12 LAB — PATHOLOGIST SMEAR REVIEW

## 2011-03-12 LAB — PHOSPHORUS: Phosphorus: 5.5 mg/dL — ABNORMAL HIGH (ref 2.3–4.6)

## 2011-03-12 MED ORDER — PARICALCITOL 5 MCG/ML IV SOLN
INTRAVENOUS | Status: AC
  Administered 2011-03-12: 4 ug via INTRAVENOUS
  Filled 2011-03-12: qty 1

## 2011-03-12 MED ORDER — OXYCODONE HCL 5 MG PO TABS
ORAL_TABLET | ORAL | Status: AC
  Administered 2011-03-12: 5 mg via ORAL
  Filled 2011-03-12: qty 1

## 2011-03-12 MED ORDER — DARBEPOETIN ALFA-POLYSORBATE 200 MCG/0.4ML IJ SOLN
INTRAMUSCULAR | Status: AC
  Administered 2011-03-12: 200 ug via INTRAVENOUS
  Filled 2011-03-12: qty 0.4

## 2011-03-12 MED ORDER — SODIUM CHLORIDE 0.9 % IV SOLN
INTRAVENOUS | Status: DC
  Administered 2011-03-12 (×2): 20 mL/h via INTRAVENOUS

## 2011-03-12 NOTE — Progress Notes (Signed)
Per Dr. Cherlyn Cushing orders. HD cath accessed for IVF.  Blue port accessed, pulled 10cc of blood off, flushed with 10cc NS, IVF-NS started at 20cc/hr with a filer.  Kristi RN is aware.  [employer]

## 2011-03-12 NOTE — Progress Notes (Signed)
Off unit.

## 2011-03-12 NOTE — Progress Notes (Signed)
Dressing came off patients l arm. Replaced xeroform gauze and rewrapped with kerlix

## 2011-03-12 NOTE — Progress Notes (Signed)
Subjective: 55 year old with past medical history significant for chronic kidney disease, history of Chronic hydronephrosis, ureteral Stent who was admitted to the hospital on December 20 after being referred by her nephrologist to the emergency department for abnormal lab work. Her creatinine baseline is usually in the 7 range, her creatinine  the day of admission was at 16. Patient was admitted with progression renal failure and metabolic acidosis. She was also found to have leukocytosis at 35. - She underwent placement of a Diatek and began dialysis treatments urgently on 03/05/2011.  The nephrologist consultation note mentions the etiology of this patient's chronic kidney disease is related to bilateral hydronephrosis and reflux nephropathy. A renal ultrasound has been ordered his admission and is consistent with medical-renal disease, negative for hydronephrosis.  -She also has history of  recurrent  UTIs. She has been tolerating dialysis and will be set up for outpt dialysis.    Tired after HD today  Objective: Filed Vitals:   03/12/11 1330 03/12/11 1847 03/12/11 1942 03/12/11 2124  BP: 109/72 110/75  121/67  Pulse: 92 85  78  Temp: 99.3 F (37.4 C) 99.7 F (37.6 C)  98.7 F (37.1 C)  TempSrc: Oral Oral  Oral  Resp: 17 17  18   Height:      Weight:   78.2 kg (172 lb 6.4 oz)   SpO2: 95% 95%  96%   Weight change: 0.4 kg (14.1 oz)  Intake/Output Summary (Last 24 hours) at 03/12/11 2128 Last data filed at 03/12/11 1848  Gross per 24 hour  Intake   1980 ml  Output   1500 ml  Net    480 ml    General: Alert, awake, oriented x3, in no acute distress.  Heart: Regular rate and rhythm, without murmurs, rubs, gallops.  Lungs: good bilateral air movement.  Abdomen: Soft, nontender, nondistended, positive bowel sounds.  Neuro: Grossly intact, nonfocal. Extremities: left upper extremity, graft with dressing. LE no edema.   Lab Results:  [facility] 03/12/11 0758 03/11/11 0600  NA 134*  133*  K 3.3* 3.9  CL 91* 92*  CO2 23 23  GLUCOSE 113* 98  BUN 61* 48*  CREATININE 7.80* 6.46*  CALCIUM 7.0* 7.8*  MG -- --  PHOS 5.5* 4.9*    Basename 03/12/11 0758 03/11/11 0600  AST 58* 156*  ALT 57* 76*  ALKPHOS 488* 773*  BILITOT 0.2* 0.4  PROT 6.3 6.5  ALBUMIN 1.7* 1.8*    Basename 03/12/11 0758 03/11/11 0600  WBC 5.5 24.8*  NEUTROABS -- 20.6*  HGB 6.8* 7.7*  HCT 20.7* 23.2*  MCV 91.6 91.3  PLT 248 PLATELET CLUMPS NOTED ON SMEAR, COUNT APPEARS ADEQUATE    Micro Results: Recent Results (from the past 240 hour(s))  URINE CULTURE     Status: Normal   Collection Time   03/04/11  9:36 PM      Component Value Range Status Comment   Specimen Description URINE, CLEAN CATCH   Final    Special Requests NONE   Final    Setup Time [id number]   Final    Colony Count NO GROWTH   Final    Culture NO GROWTH   Final    Report Status 03/05/2011 FINAL   Final   MRSA PCR SCREENING     Status: Normal   Collection Time   03/05/11  2:26 AM      Component Value Range Status Comment   MRSA by PCR NEGATIVE  NEGATIVE  Final  CULTURE, BLOOD (ROUTINE X 2)     Status: Normal   Collection Time   03/05/11  5:00 AM      Component Value Range Status Comment   Specimen Description BLOOD RIGHT HAND   Final    Special Requests BOTTLES DRAWN AEROBIC AND ANAEROBIC [facility]   Final    Setup Time [id number]   Final    Culture NO GROWTH 5 DAYS   Final    Report Status 03/11/2011 FINAL   Final   CULTURE, BLOOD (ROUTINE X 2)     Status: Normal   Collection Time   03/05/11  5:15 AM      Component Value Range Status Comment   Specimen Description BLOOD LEFT HAND   Final    Special Requests     Final    Value: BOTTLES DRAWN AEROBIC AND ANAEROBIC 10CC AEROBIC [place] ANAEROBIC   Setup Time [id number]   Final    Culture NO GROWTH 5 DAYS   Final    Report Status 03/11/2011 FINAL   Final   URINE CULTURE     Status: Normal   Collection Time   03/09/11  8:17 PM      Component Value Range  Status Comment   Specimen Description URINE, CLEAN CATCH   Final    Special Requests NONE   Final    Setup Time [id number]   Final    Colony Count >=100,000 COLONIES/ML   Final    Culture PSEUDOMONAS AERUGINOSA   Final    Report Status 03/12/2011 FINAL   Final    Organism ID, Bacteria PSEUDOMONAS AERUGINOSA   Final   PATHOLOGIST SMEAR REVIEW     Status: Normal   Collection Time   03/10/11  3:12 PM      Component Value Range Status Comment   Tech Review NORMOCYTIC ANEMIA. NEUTROPHILIA.   Final   URINE CULTURE     Status: Normal   Collection Time   03/11/11  1:42 PM      Component Value Range Status Comment   Specimen Description URINE, CLEAN CATCH   Final    Special Requests NONE   Final    Setup Time [id number]   Final    Colony Count >=100,000 COLONIES/ML   Final    Culture     Final    Value: Multiple bacterial morphotypes present, none predominant. Suggest appropriate recollection if clinically indicated.   Report Status 03/12/2011 FINAL   Final     Studies/Results: No results found.  Medications: I have reviewed the patient's current medications.  Chronic kidney disease, stage V requiring chronic dialysis (03/05/2011) New dialysis patient.  Continue with Dialysis.  Metabolic acidosis ([id number])  Resolved.  Likely secondary to renal failure.   Hx of Hydronephrosis, bilateral (03/05/2011)  Renal ultrasound Done 12-21 showed :Severely echogenic kidneys bilaterally with cortical thinning compatible with chronic medical renal disease. Right ureteral stent visualized.  No hydronephrosis.  Leukocytosis (03/05/2011):  Patient received 5 days of ceftriaxone for UTI.  Procalcitonin level high . She denies cough, diarrhea. A chest x-ray was negative for infiltrates. Blood cultures obtained 12-20 no growth to date. Urine culture obtain on 12-19 no growth. Graft reviewed by vascular, no evidence of graft infection.  Repeated UA on 12-25 with too numerous to count WBC, will  follow culture.  Vancomycin and Zosyn started 12/24 .  -  WBC decreasing after started vancomycin and zosyn.    Anemia associated with chronic renal failure (03/06/2011) Hb 25month ago was  at 10 . Continue with ararnesp, IV iron. guaiac stool pending. SP Transfused 1 unit 12-25. Blood loss during dialysis treatment 12-24. Bili Nl unlikely hemolysis. Anemia panel 12-20 showed: Iron at 73, saturation ratio 39, ferritin at 1188. c/w Anemia of chronic disease.   Thrombocytosis (03/06/2011) probably reactive secondary to infection .        LOS: 8 days   Leonel Mccollum M.D.  [facility] 03/12/2011, 9:28 PM

## 2011-03-12 NOTE — Progress Notes (Signed)
On HD via R IJ tunneled cath.  BP 136/65, Goal 1 L  Hgb 7.7 yesterday , K 3.9 yesterday Fe/TIBC 39% sat, ferritin 1188--on Aranesp 200/wk and IV Fe 62.5/wk--will d/c IV Fe PTH 708 (on zemplar 27mcg each HD)  She still has R ureteral stent.  She says this was last changed by Dr. Risa Grill in May 2012.  It's possible this could be source of elevated WBC.  I'd suggest that he see [name] to let us know whether stent needs to be changes.  Still on zosyn  and Vanco.  Awaiting approval for outpatient dialysis ([place])

## 2011-03-12 NOTE — Consult Note (Signed)
Urology Consult  Referring physician: Marye Round Reason for referral: Retained JJ stents  History of Present Illness: Mary Wilcox is very well known to me over the last several years. When we initially cared for her she had severe bilateral hydronephrosis and a fixed cervix. She was eventually diagnosed with cervical cancer. The patient was subsequently seen by oncology and nephrology but was extremely noncompliant in general he did not keep any are for followup appointments. Her renal function did normalize after placement of double-J stent that then she continued to be extremely noncompliant and stent exchanges were often delayed much beyond what would be accepted. When she again followed up in May of 2012 her creatinine was just over 7. Double-J stent exchange was again performed and she saw no real improvement in her overall renal function and it's not entirely clear the stents are really providing much benefit to her at this point. She continued to be noncompliant with followup although she has seen Dr. Justin Mend on occasion. She has failed to show up for appointments for consideration of AV graft formation in her situation culminated in acute renal failure with recent admission. I reviewed her notes. She is now being started on hemodialysis. She has had a leukocytosis was some concern that she may have some chronic urinary tract infection exacerbated by her double-J stent. Her most recent urine culture is positive for pseudomonas sensitive to ciprofloxacin. She is really otherwise without complaints at this time.  Past Medical History  Diagnosis Date  . Chronic kidney disease   . Thyroid disorder   . Cancer     cervical ca 2006   Past Surgical History  Procedure Date  . Ureter surgery     stent  . Tubal ligation   . Insertion of dialysis catheter 03/05/2011    Procedure: INSERTION OF DIALYSIS CATHETER;  Surgeon: Angelia Mould, MD;  Location: [place];  Service: Vascular;  Laterality: Right;   start 1041- finish 1051  . Av fistula placement 03/05/2011    Procedure: INSERTION OF ARTERIOVENOUS (AV) GORE-TEX GRAFT ARM;  Surgeon: Angelia Mould, MD;  Location: [facility];  Service: Vascular;  Laterality: Left;  start 1115   finish 1250    Medications:  Scheduled:   . cinacalcet  60 mg Oral 1 day or 1 dose  . darbepoetin (ARANESP) injection - DIALYSIS  200 mcg Intravenous Q Thu-HD  . multivitamin  1 tablet Oral Daily  . paricalcitol  4 mcg Intravenous 3 times weekly  . piperacillin-tazobactam (ZOSYN)  IV  2.25 g Intravenous Q8H  . polyethylene glycol  17 g Oral BID  . vancomycin  750 mg Intravenous Q T,Th,Sa-HD  . DISCONTD: ferric glucontate (NULECIT) IV  62.5 mg Intravenous Weekly    Allergies:  Allergies  Allergen Reactions  . Prozac (Fluoxetine Hcl) Hives    Family History  Problem Relation Age of Onset  . Lung cancer Father     Social History:  reports that she has never smoked. She does not have any smokeless tobacco history on file. She reports that she does not drink alcohol or use illicit drugs.  Review of Systems: Pertinent items are noted in HPI. A comprehensive review of systems was negative except for: Constitutional: positive for fatigue  Physical Exam:  Vital signs in last 24 hours: Temp:  [97 F (36.1 C)-99.8 F (37.7 C)] 99.3 F (37.4 C) (12/27 1330) Pulse Rate:  [79-104] 92  (12/27 1330) Resp:  [17-24] 17  (12/27 1330) BP: (95-137)/(51-85) 109/72 mmHg (  12/27 1330) SpO2:  [85 %-100 %] 95 % (12/27 1330) Weight:  [78.6 kg (173 lb 4.5 oz)-80.8 kg (178 lb 2.1 oz)] 173 lb 4.5 oz (78.6 kg) (12/27 1213) General appearance: alert and appears stated age Head: Normocephalic, without obvious abnormality, atraumatic Eyes: conjunctivae/corneas clear. EOM's intact.  Oropharynx: moist mucous membranes Neck: supple, symmetrical, trachea midline Resp: normal respiratory effort Cardio: regular rate and rhythm Back: symmetric, no curvature. ROM normal. No CVA  tenderness. GI: soft, non-tender; bowel sounds normal; no masses,  no organomegaly PE FEMALE GENITAL EXAM UROLOGY not done   Laboratory Data:   Basename 03/12/11 0758 03/11/11 0600 03/10/11 1512  WBC 5.5 24.8* 26.5*  HGB 6.8* 7.7* 7.8*  HCT 20.7* 23.2* 23.7*   BMET  Basename 03/12/11 0758 03/11/11 0600  NA 134* 133*  K 3.3* 3.9  CL 91* 92*  CO2 23 23  GLUCOSE 113* 98  BUN 61* 48*  CREATININE 7.80* 6.46*  CALCIUM 7.0* 7.8*   No results found for this basename: LABPT:3,INR:3 in the last 72 hours No results found for this basename: LABURIN:1 in the last 72 hours Results for orders placed during the hospital encounter of 03/04/11  URINE CULTURE     Status: Normal   Collection Time   03/04/11  9:36 PM      Component Value Range Status Comment   Specimen Description URINE, CLEAN CATCH   Final    Special Requests NONE   Final    Setup Time [id number]   Final    Colony Count NO GROWTH   Final    Culture NO GROWTH   Final    Report Status 03/05/2011 FINAL   Final   MRSA PCR SCREENING     Status: Normal   Collection Time   03/05/11  2:26 AM      Component Value Range Status Comment   MRSA by PCR NEGATIVE  NEGATIVE  Final   CULTURE, BLOOD (ROUTINE X 2)     Status: Normal   Collection Time   03/05/11  5:00 AM      Component Value Range Status Comment   Specimen Description BLOOD RIGHT HAND   Final    Special Requests BOTTLES DRAWN AEROBIC AND ANAEROBIC [facility] EACH   Final    Setup Time [id number]   Final    Culture NO GROWTH 5 DAYS   Final    Report Status 03/11/2011 FINAL   Final   CULTURE, BLOOD (ROUTINE X 2)     Status: Normal   Collection Time   03/05/11  5:15 AM      Component Value Range Status Comment   Specimen Description BLOOD LEFT HAND   Final    Special Requests     Final    Value: BOTTLES DRAWN AEROBIC AND ANAEROBIC 10CC AEROBIC [place] ANAEROBIC   Setup Time [id number]   Final    Culture NO GROWTH 5 DAYS   Final    Report Status 03/11/2011 FINAL   Final     URINE CULTURE     Status: Normal   Collection Time   03/09/11  8:17 PM      Component Value Range Status Comment   Specimen Description URINE, CLEAN CATCH   Final    Special Requests NONE   Final    Setup Time [id number]   Final    Colony Count >=100,000 COLONIES/ML   Final    Culture PSEUDOMONAS AERUGINOSA   Final    Report Status 03/12/2011 FINAL  Final    Organism ID, Bacteria PSEUDOMONAS AERUGINOSA   Final   PATHOLOGIST SMEAR REVIEW     Status: Normal   Collection Time   03/10/11  3:12 PM      Component Value Range Status Comment   Tech Review NORMOCYTIC ANEMIA. NEUTROPHILIA.   Final    Creatinine:  Basename 03/12/11 0758 03/11/11 0600 03/10/11 1513 03/10/11 0500 03/09/11 1120 03/07/11 1330 03/07/11 0400  CREATININE 7.80* 6.46* 5.10* 4.12* 7.64* 6.72* 5.91*    Imaging: No results found.  Impression/Assessment:  Mary Wilcox has always been extremely pleasant but chronically noncompliant patient. Over the years she has developed progressive renal failure. Initially this was clearly obstructive in nature but more recently I suspect this was progressive without definitive ongoing obstructive component. She did have double-J stents were placed approximately 6 months ago. At that time the stents did not show any evidence of obstruction or encrustation. After replacement of her double-J stents we saw no further improvement in her renal function. At this point I don't believe the ureteral stents are really serving any good clinical purpose. She is currently colonized with Pseudomonas and it does appear that her antibiotics may need to be changed. I would plan on removal of her double-J stents sometime in the near future. This potentially could be done at the bedside hear during this acute hospitalization or shortly after discharge in our office which would be certainly easier facility to perform that task. Given the lack of hydronephrosis it does not appear that the stents are currently  obstructed and her clinical situation is improving and therefore I do not believe that any type of urgent intervention is required at this time.  Plan:  As above  Lyla Jasek S 03/12/2011, 6:00 PM

## 2011-03-12 NOTE — Progress Notes (Addendum)
Pharmacy Consult - Follow-up  Consult for Vancomycin (Day #4) Indication: Leukocytosis and ?UTI  Assessment: 58yoF with CKD (baseline Scr ~7) and hx recurrent UTIs admitted for leukocytosis and started on Vancomycin and Zosyn. New to hemodialysis.  Pharmacist System-Based Medication Review: Infectious Disease: Admitted with leukocytosis, given rocephin 1gm x1 in ED. On Zosyn and received Vancomycin 1500mg  after HD on 12/24; in HD now, vancomycin 750mg  dose scheduled for today.  Urine cx (+) pseudomonas sensitive to ciprofloxacin. Blood cultures negative. Has R urethral stent--potential cause for elevated WBC? Tm=99.8, WBC down to 5.5 (from 24.8 yesterday).  H/o recurrent UTIs.  Cardiovascular: VSS, no previous cardiac history Endocrinology: No history of DM, hyperparathyroid, Phos=5.5, corrected calcium 8.84; On cinacalcet, paricalciol Gastrointestinal / Nutrition: renal diet;  jump in AST/ALT yesterday, improved today Neurology: WNL Nephrology: ESRD, new start to HD 12/20, being dialyzed today.  K=3.3 Pulmonary: 93% RA Hematology / Oncology: h/o cervical cancer. On IV iron qHD and aranesp 244mcg qthur for anemia CKD, Hgb 6.8, thrombocytosis (plts 497) secondary to infection improved (plts 248); per MD note, some blood loss with HD 12/24; guiac stool pending Best practices: on SCD, GI prophylaxis not indicated; awaiting for approval for outpatient dialysis  Goals  Pre-HD level: 15-76mcg/ml  Plan:  Continue Vancomycin 750 mg IV qHD-TTSa  Consider switching Vanc/Zosyn to PO ciprofloxacin (recommend Cipro 500mg  PO Q24 hours x 7 days for ~10 day course total). Cipro should be given after HD on hemodialysis days.  Woodroe Chen, PharmD     Pager [phone] 03/12/2011   11:38 AM    -----   Blood pressure 127/75, pulse 90, temperature 98.9 F (37.2 C), temperature source Oral, resp. rate 21, height 5\' 1"  (1.549 m), weight 178 lb 2.1 oz (80.8 kg), SpO2 100.00%.   Lab 03/12/11 0758  03/11/11 0600 03/10/11 1513 03/10/11 0500 03/09/11 1120 03/07/11 1330  NA 134* 133* 135 136 133* --  K 3.3* 3.9 -- -- -- --  CL 91* 92* 92* 94* 92* --  CO2 23 23 28 28 22  --  GLUCOSE 113* 98 99 115* 137* --  BUN 61* 48* 33* 24* 63* --  CREATININE 7.80* 6.46* 5.10* 4.12* 7.64* --  CALCIUM 7.0* 7.8* 8.5 8.7 9.2 --  MG -- -- -- -- -- --  PHOS 5.5* 4.9* 4.5 -- 5.9* 6.3*    Lab 03/12/11 0758 03/11/11 0600 03/10/11 1512 03/10/11 0500 03/09/11 0912 03/06/11 0446  WBC 5.5 24.8* 26.5* 28.9* 36.0* --  NEUTROABS -- 20.6* -- -- -- 27.2*  HGB 6.8* 7.7* 7.8* 5.7* 7.0* --  HCT 20.7* 23.2* 23.7* 17.9* 21.8* --  MCV 91.6 91.3 91.9 98.4 96.0 --  PLT 248 PLATELET CLUMPS NOTED ON SMEAR, COUNT APPEARS ADEQUATE 298 318 433* --   Estimated Creatinine Clearance: 7.7 ml/min (by C-G formula based on Cr of 7.8).  Microbiology: Lab Results  Component Value Date   CULT PSEUDOMONAS AERUGINOSA 03/09/2011   CULT NO GROWTH 5 DAYS 03/05/2011   CULT NO GROWTH 5 DAYS 03/05/2011   CULT NO GROWTH 03/04/2011   CULT NO GROWTH 08/29/2006    Lab 03/09/11 2017  CULT PSEUDOMONAS AERUGINOSA  SDES URINE, CLEAN CATCH

## 2011-03-13 LAB — TYPE AND SCREEN
ABO/RH(D): AB POS
Antibody Screen: NEGATIVE
Unit division: 0
Unit division: 0
Unit division: 0

## 2011-03-13 LAB — PREPARE RBC (CROSSMATCH)

## 2011-03-13 MED ORDER — LIDOCAINE HCL (PF) 1 % IJ SOLN: 5.0000 mL | INTRAMUSCULAR | Status: DC | PRN

## 2011-03-13 MED ORDER — PENTAFLUOROPROP-TETRAFLUOROETH EX AERO: 1.0000 "application " | INHALATION_SPRAY | CUTANEOUS | Status: DC | PRN

## 2011-03-13 MED ORDER — LIDOCAINE-PRILOCAINE 2.5-2.5 % EX CREA: 1.0000 "application " | TOPICAL_CREAM | CUTANEOUS | Status: DC | PRN

## 2011-03-13 MED ORDER — HEPARIN SODIUM (PORCINE) 1000 UNIT/ML DIALYSIS
20.0000 [IU]/kg | INTRAMUSCULAR | Status: DC | PRN
  Filled 2011-03-13: qty 2

## 2011-03-13 MED ORDER — SODIUM CHLORIDE 0.9 % IV SOLN: 100.0000 mL | INTRAVENOUS | Status: DC | PRN

## 2011-03-13 MED ORDER — DEXTROSE 5 % IV SOLN
1.0000 g | Freq: Two times a day (BID) | INTRAVENOUS | Status: DC
  Administered 2011-03-13 – 2011-03-14 (×3): 1 g via INTRAVENOUS
  Filled 2011-03-13 (×4): qty 1

## 2011-03-13 MED ORDER — HEPARIN SODIUM (PORCINE) 1000 UNIT/ML DIALYSIS
1000.0000 [IU] | INTRAMUSCULAR | Status: DC | PRN
  Filled 2011-03-13: qty 1

## 2011-03-13 MED ORDER — NEPRO/CARBSTEADY PO LIQD: 237.0000 mL | ORAL | Status: DC | PRN

## 2011-03-13 MED ORDER — ALTEPLASE 2 MG IJ SOLR
2.0000 mg | Freq: Once | INTRAMUSCULAR | Status: AC | PRN
  Filled 2011-03-13: qty 2

## 2011-03-13 NOTE — Progress Notes (Addendum)
Subjective: Some pain L upper arm (recent avg placed 20 Dec) Dialyzed yesterday.  2 units PRBC given in HD yesterday for hgb 6.7  Objective: Vital signs in last 24 hours: Blood pressure 106/68, pulse 74, temperature 98.3 F (36.8 C), temperature source Oral, resp. rate 20, height 5\' 1"  (1.549 m), weight 78.2 kg (172 lb 6.4 oz), SpO2 96.00%.   Intake/Output from previous day: 12/27 0701 - 12/28 0700 In: 2504 [P.O.:1160; I.V.:494; Blood:700; IV Piggyback:150] Out: 1600 [Urine:400] Intake/Output this shift: Total I/O In: 240 [P.O.:240] Out: -  Wt Readings from Last 3 Encounters:  03/12/11 78.2 kg (172 lb 6.4 oz)  03/12/11 78.2 kg (172 lb 6.4 oz)     PHYSICAL EXAM General--awake, alert, friendly Chest--clear, cath r ij clean, clear to ausc Heart--no rub Abd--nontender Extr--no edema, AVG patent L upper arm  Lab Results:   Lab 03/12/11 0758 03/11/11 0600 03/10/11 1513  NA 134* 133* 135  K 3.3* 3.9 3.7  CL 91* 92* 92*  CO2 23 23 28   BUN 61* 48* 33*  CREATININE 7.80* 6.46* 5.10*  EGFR -- -- --  GLUCOSE 113* -- --  CALCIUM 7.0* 7.8* 8.5  PHOS 5.5* 4.9* 4.5      Basename 03/12/11 0758 03/11/11 0600  WBC 5.5 24.8*  HGB 6.8* 7.7*  HCT 20.7* 23.2*  PLT 248 PLATELET CLUMPS NOTED ON SMEAR, COUNT APPEARS ADEQUATE    Scheduled:   . cinacalcet  60 mg Oral 1 day or 1 dose  . darbepoetin (ARANESP) injection - DIALYSIS  200 mcg Intravenous Q Thu-HD  . multivitamin  1 tablet Oral Daily  . paricalcitol  4 mcg Intravenous 3 times weekly  . piperacillin-tazobactam (ZOSYN)  IV  2.25 g Intravenous Q8H  . polyethylene glycol  17 g Oral BID  . vancomycin  750 mg Intravenous Q T,Th,Sa-HD   Continuous:   . sodium chloride 20 mL/hr at 03/12/11 1800    Assessment/Plan:   1.   ESRD for HD.  Approved to start Monday [facility] Dec at [facility] ([place])  AM shift.  Dialyze tomorrow 2 .  Anemia-- EPO/fe . Transfused yesterday 3.   Secondary PTH   Ca/phos 7/5.5  PTH   708  On zemplar + sensipar--no binder 4 .  Pseudomonas UTI--sens to cefipime and cipro.  Will d/c zosyn and vanco and begin cefipime 1g/d while in hosp.  She can    get 2g each HD as outpt in dialysis center to complete course 5.   Debility--PT/OT   LOS: 9 days   Desmond Tufano F 03/13/2011,11:37 AM

## 2011-03-13 NOTE — Progress Notes (Signed)
Pharmacy Consult - Follow-up  Consult for Vancomycin (Day #5) Indication: Leukocytosis and ?UTI  Assessment: 56yoF with CKD (baseline Scr ~7) and hx recurrent UTIs admitted for leukocytosis and started on Vancomycin and Zosyn. New to hemodialysis.  Pharmacist System-Based Medication Review: Infectious Disease:   Admitted with leukocytosis, given rocephin 1gm x1 in ED.   Day#5 Vanc/Zosyn; Vancomycin 1500mg  IV x1 12/24 after HD, now scheduled vancomycin 750mg  IV q HD.  Zosyn dose appropriate.    Urine cx (+) pseudomonas sensitive to ciprofloxacin. Blood cultures negative.   CXR negative for infiltrates per note.  PCT 5.64 elevated.   Tm=99.7, WBC 24.8-->5.5.  H/o recurrent UTIs.   Anticoag:  SCDs Cardiovascular: VSS, no previous cardiac history Endocrinology: No history of DM, hyperparathyroid, Phos=5.5, corrected calcium 8.84; On cinacalcet, paricalciol Gastrointestinal / Nutrition: renal diet;  jump in AST/ALT yesterday, improved today Neurology: WNL Nephrology: ESRD, new start to HD 12/20, being dialyzed today.  K=3.3 Pulmonary: RA Hematology / Oncology: h/o cervical cancer. On IV iron qHD and aranesp 258mcg qthur for anemia CKD, Hgb 6.8, thrombocytosis secondary to infection improved (plts 248); per MD note, some blood loss with HD 12/24; s/p 1 unit PRBC 12/25; guiac stool pending Best practices: on SCD, GI prophylaxis not indicated; awaiting for approval for outpatient dialysis  Goals  Pre-HD level: 15-8mcg/ml  Plan:  Continue Vancomycin 750 mg IV qHD-TTSa  Consider switching Vanc/Zosyn to PO ciprofloxacin (recommend Cipro 500mg  PO Q24 hours x 7 days for ~10 day course total). Cipro should be given after HD on hemodialysis days.   Mary Wilcox, PharmD 03/13/2011 11:20 AM  Pager: [phone]    -----   Blood pressure 106/68, pulse 74, temperature 98.3 F (36.8 C), temperature source Oral, resp. rate 20, height 5\' 1"  (1.549 m), weight 172 lb 6.4 oz (78.2 kg), SpO2  96.00%.   Lab 03/12/11 0758 03/11/11 0600 03/10/11 1513 03/10/11 0500 03/09/11 1120 03/07/11 1330  NA 134* 133* 135 136 133* --  K 3.3* 3.9 -- -- -- --  CL 91* 92* 92* 94* 92* --  CO2 23 23 28 28 22  --  GLUCOSE 113* 98 99 115* 137* --  BUN 61* 48* 33* 24* 63* --  CREATININE 7.80* 6.46* 5.10* 4.12* 7.64* --  CALCIUM 7.0* 7.8* 8.5 8.7 9.2 --  MG -- -- -- -- -- --  PHOS 5.5* 4.9* 4.5 -- 5.9* 6.3*    Lab 03/12/11 0758 03/11/11 0600 03/10/11 1512 03/10/11 0500 03/09/11 0912  WBC 5.5 24.8* 26.5* 28.9* 36.0*  NEUTROABS -- 20.6* -- -- --  HGB 6.8* 7.7* 7.8* 5.7* 7.0*  HCT 20.7* 23.2* 23.7* 17.9* 21.8*  MCV 91.6 91.3 91.9 98.4 96.0  PLT 248 PLATELET CLUMPS NOTED ON SMEAR, COUNT APPEARS ADEQUATE 298 318 433*   Estimated Creatinine Clearance: 7.5 ml/min (by C-G formula based on Cr of 7.8).  Microbiology: Lab Results  Component Value Date   CULT Multiple bacterial morphotypes present, none predominant. Suggest appropriate recollection if clinically indicated. 03/11/2011   CULT PSEUDOMONAS AERUGINOSA 03/09/2011   CULT NO GROWTH 5 DAYS 03/05/2011   CULT NO GROWTH 5 DAYS 03/05/2011   CULT NO GROWTH 03/04/2011    Lab 03/11/11 1342 03/09/11 2017  CULT Multiple bacterial morphotypes present, none predominant. Suggest appropriate recollection if clinically indicated. PSEUDOMONAS AERUGINOSA  SDES URINE, CLEAN CATCH URINE, [place]

## 2011-03-13 NOTE — Progress Notes (Signed)
Subjective: 55 year old with past medical history significant for chronic kidney disease, history of Chronic hydronephrosis, ureteral Stent who was admitted to the hospital on December 20 after being referred by her nephrologist to the emergency department for abnormal lab work. Her creatinine baseline is usually in the 7 range, her creatinine  the day of admission was at 16. Patient was admitted with progression renal failure and metabolic acidosis. She was also found to have leukocytosis at 35. - She underwent placement of a Diatek and began dialysis treatments urgently on 03/05/2011.  The nephrologist consultation note mentions the etiology of this patient's chronic kidney disease is related to bilateral hydronephrosis and reflux nephropathy. A renal ultrasound has been ordered his admission and is consistent with medical-renal disease, negative for hydronephrosis.  -She also has history of  recurrent  UTIs. She has been tolerating dialysis and will be set up for outpt dialysis.    No new complaints  Objective: Filed Vitals:   03/13/11 0500 03/13/11 1019 03/13/11 1323 03/13/11 1708  BP: 110/75 106/68 131/86 110/70  Pulse: 76 74 77 68  Temp: 98.1 F (36.7 C) 98.3 F (36.8 C) 98.5 F (36.9 C) 98.4 F (36.9 C)  TempSrc: Oral Oral Oral Oral  Resp: 19 20 18 18   Height:      Weight:      SpO2: 93% 96% 98% 98%   Weight change: -2.1 kg (-4 lb 10.1 oz)  Intake/Output Summary (Last 24 hours) at 03/13/11 1834 Last data filed at 03/13/11 1324  Gross per 24 hour  Intake   1534 ml  Output    675 ml  Net    859 ml    General: Alert, awake, oriented x3, in no acute distress.  Heart: Regular rate and rhythm, without murmurs, rubs, gallops.  Lungs: good bilateral air movement.  Abdomen: Soft, nontender, nondistended, positive bowel sounds.  Neuro: Grossly intact, nonfocal. Extremities: left upper extremity, graft with dressing. LE no edema.   Lab Results:  [facility] 03/12/11 0758 03/11/11  0600  NA 134* 133*  K 3.3* 3.9  CL 91* 92*  CO2 23 23  GLUCOSE 113* 98  BUN 61* 48*  CREATININE 7.80* 6.46*  CALCIUM 7.0* 7.8*  MG -- --  PHOS 5.5* 4.9*    Basename 03/12/11 0758 03/11/11 0600  AST 58* 156*  ALT 57* 76*  ALKPHOS 488* 773*  BILITOT 0.2* 0.4  PROT 6.3 6.5  ALBUMIN 1.7* 1.8*    Basename 03/12/11 0758 03/11/11 0600  WBC 5.5 24.8*  NEUTROABS -- 20.6*  HGB 6.8* 7.7*  HCT 20.7* 23.2*  MCV 91.6 91.3  PLT 248 PLATELET CLUMPS NOTED ON SMEAR, COUNT APPEARS ADEQUATE    Micro Results: Recent Results (from the past 240 hour(s))  URINE CULTURE     Status: Normal   Collection Time   03/04/11  9:36 PM      Component Value Range Status Comment   Specimen Description URINE, CLEAN CATCH   Final    Special Requests NONE   Final    Setup Time [id number]   Final    Colony Count NO GROWTH   Final    Culture NO GROWTH   Final    Report Status 03/05/2011 FINAL   Final   MRSA PCR SCREENING     Status: Normal   Collection Time   03/05/11  2:26 AM      Component Value Range Status Comment   MRSA by PCR NEGATIVE  NEGATIVE  Final  CULTURE, BLOOD (ROUTINE X 2)     Status: Normal   Collection Time   03/05/11  5:00 AM      Component Value Range Status Comment   Specimen Description BLOOD RIGHT HAND   Final    Special Requests BOTTLES DRAWN AEROBIC AND ANAEROBIC [facility]   Final    Setup Time [id number]   Final    Culture NO GROWTH 5 DAYS   Final    Report Status 03/11/2011 FINAL   Final   CULTURE, BLOOD (ROUTINE X 2)     Status: Normal   Collection Time   03/05/11  5:15 AM      Component Value Range Status Comment   Specimen Description BLOOD LEFT HAND   Final    Special Requests     Final    Value: BOTTLES DRAWN AEROBIC AND ANAEROBIC 10CC AEROBIC [place] ANAEROBIC   Setup Time [id number]   Final    Culture NO GROWTH 5 DAYS   Final    Report Status 03/11/2011 FINAL   Final   URINE CULTURE     Status: Normal   Collection Time   03/09/11  8:17 PM      Component  Value Range Status Comment   Specimen Description URINE, CLEAN CATCH   Final    Special Requests NONE   Final    Setup Time [id number]   Final    Colony Count >=100,000 COLONIES/ML   Final    Culture PSEUDOMONAS AERUGINOSA   Final    Report Status 03/12/2011 FINAL   Final    Organism ID, Bacteria PSEUDOMONAS AERUGINOSA   Final   PATHOLOGIST SMEAR REVIEW     Status: Normal   Collection Time   03/10/11  3:12 PM      Component Value Range Status Comment   Tech Review NORMOCYTIC ANEMIA. NEUTROPHILIA.   Final   URINE CULTURE     Status: Normal   Collection Time   03/11/11  1:42 PM      Component Value Range Status Comment   Specimen Description URINE, CLEAN CATCH   Final    Special Requests NONE   Final    Setup Time [id number]   Final    Colony Count >=100,000 COLONIES/ML   Final    Culture     Final    Value: Multiple bacterial morphotypes present, none predominant. Suggest appropriate recollection if clinically indicated.   Report Status 03/12/2011 FINAL   Final     Studies/Results: No results found.  Medications: I have reviewed the patient's current medications.  Chronic kidney disease, stage V requiring chronic dialysis (03/05/2011) New dialysis patient.  Continue with Dialysis.  Metabolic acidosis ([id number])  Resolved.  Likely secondary to renal failure.   Hx of Hydronephrosis, bilateral (03/05/2011)  Renal ultrasound Done 12-21 showed :Severely echogenic kidneys bilaterally with cortical thinning compatible with chronic medical renal disease. Right ureteral stent visualized.  No hydronephrosis.  Leukocytosis (03/05/2011): Probably due to complicated pseudomonas aeruginosa urinary tract infection Patient received 5 days of ceftriaxone for UTI. Obviously was unsuccessful Procalcitonin level high . She denies cough, diarrhea. A chest x-ray was negative for infiltrates. Blood cultures obtained 12-20 no growth to date. Urine culture obtain on 12-19 no growth. Graft  reviewed by vascular, no evidence of graft infection.  Vancomycin and Zosyn started 12/24 .  - Stopped on March 13, 2011 Plan for 2 weeks of iv cefepime   Anemia associated with chronic renal failure (03/06/2011) Hb 53month ago was at  10 . Continue with ararnesp, IV iron. guaiac stool pending. SP Transfused 1 unit 12-25. Blood loss during dialysis treatment 12-24. Bili Nl unlikely hemolysis. Anemia panel 12-20 showed: Iron at 73, saturation ratio 39, ferritin at 1188. c/w Anemia of chronic disease.   Thrombocytosis (03/06/2011) probably reactive secondary to infection .        LOS: 9 days   Reiana Poteet M.D.  [facility] 03/13/2011, 6:34 PM

## 2011-03-14 ENCOUNTER — Inpatient Hospital Stay (HOSPITAL_COMMUNITY): Payer: BC Managed Care – PPO

## 2011-03-14 LAB — RENAL FUNCTION PANEL
Albumin: 1.7 g/dL — ABNORMAL LOW (ref 3.5–5.2)
BUN: 43 mg/dL — ABNORMAL HIGH (ref 6–23)
CO2: 22 mEq/L (ref 19–32)
Calcium: 6.8 mg/dL — ABNORMAL LOW (ref 8.4–10.5)
Chloride: 93 mEq/L — ABNORMAL LOW (ref 96–112)
Creatinine, Ser: 6.08 mg/dL — ABNORMAL HIGH (ref 0.50–1.10)
GFR calc Af Amer: 8 mL/min — ABNORMAL LOW (ref >90)
GFR calc non Af Amer: 7 mL/min — ABNORMAL LOW (ref >90)
Glucose, Bld: 84 mg/dL (ref 70–99)
Phosphorus: 4 mg/dL (ref 2.3–4.6)
Potassium: 3.3 mEq/L — ABNORMAL LOW (ref 3.5–5.1)
Sodium: 132 mEq/L — ABNORMAL LOW (ref 135–145)

## 2011-03-14 LAB — CBC
HCT: 29.5 % — ABNORMAL LOW (ref 36.0–46.0)
Hemoglobin: 9.6 g/dL — ABNORMAL LOW (ref 12.0–15.0)
MCH: 28.8 pg (ref 26.0–34.0)
MCHC: 32.5 g/dL (ref 30.0–36.0)
MCV: 88.6 fL (ref 78.0–100.0)
Platelets: 236 10*3/uL (ref 150–400)
RBC: 3.33 MIL/uL — ABNORMAL LOW (ref 3.87–5.11)
RDW: 17.2 % — ABNORMAL HIGH (ref 11.5–15.5)
WBC: 18.7 10*3/uL — ABNORMAL HIGH (ref 4.0–10.5)

## 2011-03-14 MED ORDER — DEXTROSE 5 % IV SOLN
1.0000 g | INTRAVENOUS | Status: DC
  Administered 2011-03-15: 1 g via INTRAVENOUS
  Filled 2011-03-14: qty 1

## 2011-03-14 MED ORDER — OXYCODONE HCL 5 MG PO TABS
ORAL_TABLET | ORAL | Status: AC
  Administered 2011-03-14: 5 mg via ORAL
  Filled 2011-03-14: qty 1

## 2011-03-14 NOTE — Progress Notes (Signed)
Subjective: 55 year old with past medical history significant for chronic kidney disease, history of Chronic hydronephrosis, ureteral Stent who was admitted to the hospital on December 20 after being referred by her nephrologist to the emergency department for abnormal lab work. Her creatinine baseline is usually in the 7 range, her creatinine  the day of admission was at 16. Patient was admitted with progression renal failure and metabolic acidosis. She was also found to have leukocytosis at 35. - She underwent placement of a Diatek and began dialysis treatments urgently on 03/05/2011.  The nephrologist consultation note mentions the etiology of this patient's chronic kidney disease is related to bilateral hydronephrosis and reflux nephropathy. A renal ultrasound has been ordered his admission and is consistent with medical-renal disease, negative for hydronephrosis.  -She also has history of  recurrent  UTIs. She has been tolerating dialysis and has been set up for outpt dialysis.  She says she's feeling better today   Objective: Filed Vitals:   03/14/11 0703 03/14/11 0800 03/14/11 0830 03/14/11 0900  BP: 123/61 126/62 136/70 136/68  Pulse: 76 71 87 69  Temp:      TempSrc:      Resp:      Height:      Weight:      SpO2:       Weight change: -0.85 kg (-1 lb 14 oz)  Intake/Output Summary (Last 24 hours) at 03/14/11 0930 Last data filed at 03/14/11 0700  Gross per 24 hour  Intake    650 ml  Output    575 ml  Net     75 ml   Filed Vitals:   03/14/11 0900  BP: 136/68  Pulse: 69  Temp:   Resp:   99 (37.2) 98.9 (37.2) 97.2 (36.2) Alert and oriented x3 Chest clear to auscultation without wheezes rhonchi crackles Heart regular the rhythm without murmurs rubs or gallops Left arm with decreased edema  Lab Results:  [facility] 03/14/11 0750 03/12/11 0758  NA 132* 134*  K 3.3* 3.3*  CL 93* 91*  CO2 22 23  GLUCOSE 84 113*  BUN 43* 61*  CREATININE 6.08* 7.80*  CALCIUM 6.8* 7.0*    MG -- --  PHOS 4.0 5.5*    Basename 03/14/11 0750 03/12/11 0758  AST -- 58*  ALT -- 57*  ALKPHOS -- 488*  BILITOT -- 0.2*  PROT -- 6.3  ALBUMIN 1.7* 1.7*    Basename 03/14/11 0755 03/12/11 0758  WBC 18.7* 5.5  NEUTROABS -- --  HGB 9.6* 6.8*  HCT 29.5* 20.7*  MCV 88.6 91.6  PLT 236 248    Micro Results: Recent Results (from the past 240 hour(s))  URINE CULTURE     Status: Normal   Collection Time   03/04/11  9:36 PM      Component Value Range Status Comment   Specimen Description URINE, CLEAN CATCH   Final    Special Requests NONE   Final    Setup Time [id number]   Final    Colony Count NO GROWTH   Final    Culture NO GROWTH   Final    Report Status 03/05/2011 FINAL   Final   MRSA PCR SCREENING     Status: Normal   Collection Time   03/05/11  2:26 AM      Component Value Range Status Comment   MRSA by PCR NEGATIVE  NEGATIVE  Final   CULTURE, BLOOD (ROUTINE X 2)     Status: Normal  Collection Time   03/05/11  5:00 AM      Component Value Range Status Comment   Specimen Description BLOOD RIGHT HAND   Final    Special Requests BOTTLES DRAWN AEROBIC AND ANAEROBIC [facility] EACH   Final    Setup Time [id number]   Final    Culture NO GROWTH 5 DAYS   Final    Report Status 03/11/2011 FINAL   Final   CULTURE, BLOOD (ROUTINE X 2)     Status: Normal   Collection Time   03/05/11  5:15 AM      Component Value Range Status Comment   Specimen Description BLOOD LEFT HAND   Final    Special Requests     Final    Value: BOTTLES DRAWN AEROBIC AND ANAEROBIC 10CC AEROBIC [place] ANAEROBIC   Setup Time [id number]   Final    Culture NO GROWTH 5 DAYS   Final    Report Status 03/11/2011 FINAL   Final   URINE CULTURE     Status: Normal   Collection Time   03/09/11  8:17 PM      Component Value Range Status Comment   Specimen Description URINE, CLEAN CATCH   Final    Special Requests NONE   Final    Setup Time [id number]   Final    Colony Count >=100,000 COLONIES/ML   Final     Culture PSEUDOMONAS AERUGINOSA   Final    Report Status 03/12/2011 FINAL   Final    Organism ID, Bacteria PSEUDOMONAS AERUGINOSA   Final   PATHOLOGIST SMEAR REVIEW     Status: Normal   Collection Time   03/10/11  3:12 PM      Component Value Range Status Comment   Tech Review NORMOCYTIC ANEMIA. NEUTROPHILIA.   Final   URINE CULTURE     Status: Normal   Collection Time   03/11/11  1:42 PM      Component Value Range Status Comment   Specimen Description URINE, CLEAN CATCH   Final    Special Requests NONE   Final    Setup Time [id number]   Final    Colony Count >=100,000 COLONIES/ML   Final    Culture     Final    Value: Multiple bacterial morphotypes present, none predominant. Suggest appropriate recollection if clinically indicated.   Report Status 03/12/2011 FINAL   Final    PSEUDOMONAS AERUGINOSA 12/24      Antibiotic  Sensitivity  Microscan  Status    CEFEPIME  Sensitive  8  Final    Method:  MIC    CEFTAZIDIME  Resistant  >=64  Final    Method:  MIC    CIPROFLOXACIN  Sensitive  <=0.25  Final    Method:  MIC    GENTAMICIN  Sensitive  <=1  Final    Method:  MIC    IMIPENEM  Sensitive  <=1  Final    Method:  MIC    TOBRAMYCIN  Sensitive  <=1  Final    Method:  MIC     Comments  PSEUDOMONAS AERUGINOSA (MIC)      PSEUDOMONAS AERUGINOSA      Medications:     . ceFEPime (MAXIPIME) IV  1 g Intravenous Q12H  . cinacalcet  60 mg Oral 1 day or 1 dose  . darbepoetin (ARANESP) injection - DIALYSIS  200 mcg Intravenous Q Thu-HD  . multivitamin  1 tablet Oral Daily  . paricalcitol  4 mcg Intravenous 3  times weekly  . polyethylene glycol  17 g Oral BID  . DISCONTD: piperacillin-tazobactam (ZOSYN)  IV  2.25 g Intravenous Q8H  . DISCONTD: vancomycin  750 mg Intravenous Q T,Th,Sa-HD      Chronic kidney disease, stage V requiring chronic dialysis (03/05/2011) New dialysis patient.  Continue with Dialysis.  Metabolic acidosis ([id number])  Resolved.  Likely secondary to  renal failure.   Hx of Hydronephrosis, bilateral (03/05/2011)  Renal ultrasound Done 12-21 showed :Severely echogenic kidneys bilaterally with cortical thinning compatible with chronic medical renal disease. Right ureteral stent visualized.  No hydronephrosis. She will have the stent removed as outpatient.   Leukocytosis (03/05/2011): Probably due to complicated pseudomonas aeruginosa urinary tract infection Patient received 5 days of ceftriaxone for UTI. Obviously was unsuccessful Procalcitonin level high . She denies cough, diarrhea. A chest x-ray was negative for infiltrates. Blood cultures obtained 12-20 no growth to date. Urine culture obtain on 12-19 no growth. Graft reviewed by vascular, no evidence of graft infection.  Vancomycin and Zosyn started 12/24 .  - Stopped on March 13, 2011 Plan for 2 weeks of iv cefepime start date 03/14/11    Anemia associated with chronic renal failure (03/06/2011) Hb 85month ago was at 10 . Continue with ararnesp, IV iron. guaiac stool pending. SP Transfused 1 unit 12-25. Blood loss during dialysis treatment 12-24. Bili Nl unlikely hemolysis. Anemia panel 12-20 showed: Iron at 73, saturation ratio 39, ferritin at 1188. c/w Anemia of chronic disease.  Hemoglobin has been stable no  Thrombocytosis (03/06/2011) probably reactive secondary to infection .   Discharge home tomorrow if leukocytosis continues to improve     LOS: 10 days   Angelgabriel Willmore M.D.  [facility] 03/14/2011, 9:30 AM

## 2011-03-14 NOTE — Progress Notes (Signed)
ANTIBIOTIC CONSULT NOTE - FOLLOW UP  Indication: Pseudomonas UTI  Allergies  Allergen Reactions  . Prozac (Fluoxetine Hcl) Hives    Patient Measurements: Height: 5\' 1"  (154.9 cm) Weight: 168 lb 10.4 oz (76.5 kg) IBW/kg (Calculated) : 47.8  Adjusted Body Weight:   Vital Signs: Temp: 99.7 F (37.6 C) (12/29 1400) Temp src: Oral (12/29 1400) BP: 127/81 mmHg (12/29 1400) Pulse Rate: 84  (12/29 1400) Intake/Output from previous day: 12/28 0701 - 12/29 0700 In: 890 [P.O.:840; IV Piggyback:50] Out: 575 [Urine:575] Intake/Output from this shift: Total I/O In: 240 [P.O.:240] Out: -   Labs:  Basename 03/14/11 0755 03/14/11 0750 03/12/11 0758  WBC 18.7* -- 5.5  HGB 9.6* -- 6.8*  PLT 236 -- 248  LABCREA -- -- --  CREATININE -- 6.08* 7.80*   Estimated Creatinine Clearance: 9.6 ml/min (by C-G formula based on Cr of 6.08). No results found for this basename: VANCOTROUGH:2,VANCOPEAK:2,VANCORANDOM:2,GENTTROUGH:2,GENTPEAK:2,GENTRANDOM:2,TOBRATROUGH:2,TOBRAPEAK:2,TOBRARND:2,AMIKACINPEAK:2,AMIKACINTROU:2,AMIKACIN:2, in the last 72 hours   Microbiology: Recent Results (from the past 720 hour(s))  URINE CULTURE     Status: Normal   Collection Time   03/04/11  9:36 PM      Component Value Range Status Comment   Specimen Description URINE, CLEAN CATCH   Final    Special Requests NONE   Final    Setup Time [id number]   Final    Colony Count NO GROWTH   Final    Culture NO GROWTH   Final    Report Status 03/05/2011 FINAL   Final   MRSA PCR SCREENING     Status: Normal   Collection Time   03/05/11  2:26 AM      Component Value Range Status Comment   MRSA by PCR NEGATIVE  NEGATIVE  Final   CULTURE, BLOOD (ROUTINE X 2)     Status: Normal   Collection Time   03/05/11  5:00 AM      Component Value Range Status Comment   Specimen Description BLOOD RIGHT HAND   Final    Special Requests BOTTLES DRAWN AEROBIC AND ANAEROBIC [facility] EACH   Final    Setup Time [id number]   Final    Culture NO GROWTH 5 DAYS   Final    Report Status 03/11/2011 FINAL   Final   CULTURE, BLOOD (ROUTINE X 2)     Status: Normal   Collection Time   03/05/11  5:15 AM      Component Value Range Status Comment   Specimen Description BLOOD LEFT HAND   Final    Special Requests     Final    Value: BOTTLES DRAWN AEROBIC AND ANAEROBIC 10CC AEROBIC [place] ANAEROBIC   Setup Time [id number]   Final    Culture NO GROWTH 5 DAYS   Final    Report Status 03/11/2011 FINAL   Final   URINE CULTURE     Status: Normal   Collection Time   03/09/11  8:17 PM      Component Value Range Status Comment   Specimen Description URINE, CLEAN CATCH   Final    Special Requests NONE   Final    Setup Time [id number]   Final    Colony Count >=100,000 COLONIES/ML   Final    Culture PSEUDOMONAS AERUGINOSA   Final    Report Status 03/12/2011 FINAL   Final    Organism ID, Bacteria PSEUDOMONAS AERUGINOSA   Final   PATHOLOGIST SMEAR REVIEW     Status: Normal   Collection Time   03/10/11  3:12 PM      Component Value Range Status Comment   Tech Review NORMOCYTIC ANEMIA. NEUTROPHILIA.   Final   URINE CULTURE     Status: Normal   Collection Time   03/11/11  1:42 PM      Component Value Range Status Comment   Specimen Description URINE, CLEAN CATCH   Final    Special Requests NONE   Final    Setup Time [id number]   Final    Colony Count >=100,000 COLONIES/ML   Final    Culture     Final    Value: Multiple bacterial morphotypes present, none predominant. Suggest appropriate recollection if clinically indicated.   Report Status 03/12/2011 FINAL   Final     Anti-infectives     Start     Dose/Rate Route Frequency Ordered Stop   03/13/11 1200   ceFEPIme (MAXIPIME) 1 g in dextrose 5 % 50 mL IVPB        1 g 100 mL/hr over 30 Minutes Intravenous Every 12 hours 03/13/11 1151     03/12/11 1200   vancomycin (VANCOCIN) 750 mg in sodium chloride 0.9 % 150 mL IVPB  Status:  Discontinued        750 mg 150 mL/hr over 60  Minutes Intravenous Every T-Th-Sa (Hemodialysis) 03/09/11 1319 03/13/11 1151   03/09/11 1430   vancomycin (VANCOCIN) 1,500 mg in sodium chloride 0.9 % 500 mL IVPB        1,500 mg 250 mL/hr over 120 Minutes Intravenous  Once 03/09/11 1319 03/09/11 1624   03/09/11 1400   piperacillin-tazobactam (ZOSYN) IVPB 3.375 g  Status:  Discontinued        3.375 g 12.5 mL/hr over 240 Minutes Intravenous 3 times per day 03/09/11 1235 03/09/11 1320   03/09/11 1400   piperacillin-tazobactam (ZOSYN) IVPB 2.25 g  Status:  Discontinued        2.25 g 100 mL/hr over 30 Minutes Intravenous Every 8 hours 03/09/11 1321 03/13/11 1151   03/09/11 1245   piperacillin-tazo (ZOSYN) NICU IV syringe 200 mg/mL  Status:  Discontinued        75 mg/kg  78 kg 58.6 mL/hr over 30 Minutes Intravenous Every 8 hours 03/09/11 1235 03/09/11 1236   03/09/11 1230   piperacillin-tazo (ZOSYN) NICU IV syringe 200 mg/mL  Status:  Discontinued        75 mg/kg  78 kg 58.6 mL/hr over 30 Minutes Intravenous Every 8 hours 03/09/11 1228 03/09/11 1235   03/05/11 0015   cefTRIAXone (ROCEPHIN) 1 g in dextrose 5 % 50 mL IVPB        1 g 100 mL/hr over 30 Minutes Intravenous  Once 03/05/11 0005 03/05/11 0142          Assessment: 55yof with ESRD on HD who received 5 days of Vancomycin and Zosyn for Pseudomonas UTI. Antibiotics have now been changed to Cefepime 1g q12h - will adjust to appropriate HD regimen.  Goal of Therapy:  Clinical improvement  Plan:  1. Change Cefepime to 1g IV q24h. Once outpatient, can also change to 2g qHD [phone] per references).  2. Consider transitioning to oral therapy (Cipro) if and when clinically appropriate.   Earleen Newport [id number] 03/14/2011,4:53 PM

## 2011-03-14 NOTE — Progress Notes (Signed)
On HD via R IJ tunneled cath.  L upper arm AVG patent (placed 20 Dec by Dr. Scot Dock).  BP 126/67 , Goal 2.8 L. Renal profile and CBC pending.  Hgb was 6.8 on 27 Dec and she received 2U PRBC in dialysis.  Currently on cefipime for pseudomonas UTI.  Outpt dialysis center ([facility]) has ordered cefipime and it should be there Monday. Wt Readings from Last 3 Encounters:  03/12/11 78.2 kg (172 lb 6.4 oz)  03/12/11 78.2 kg (172 lb 6.4 oz)

## 2011-03-15 LAB — CBC
HCT: 28.4 % — ABNORMAL LOW (ref 36.0–46.0)
Hemoglobin: 9.1 g/dL — ABNORMAL LOW (ref 12.0–15.0)
MCH: 29 pg (ref 26.0–34.0)
MCHC: 32 g/dL (ref 30.0–36.0)
MCV: 90.4 fL (ref 78.0–100.0)
Platelets: 225 10*3/uL (ref 150–400)
RBC: 3.14 MIL/uL — ABNORMAL LOW (ref 3.87–5.11)
RDW: 17.1 % — ABNORMAL HIGH (ref 11.5–15.5)
WBC: 14.3 10*3/uL — ABNORMAL HIGH (ref 4.0–10.5)

## 2011-03-15 MED ORDER — CINACALCET HCL 30 MG PO TABS: 60.0000 mg | ORAL_TABLET | ORAL | Status: DC

## 2011-03-15 MED ORDER — OXYCODONE HCL 5 MG PO TABS: 5.0000 mg | ORAL_TABLET | Freq: Four times a day (QID) | ORAL | Status: AC | PRN

## 2011-03-15 MED ORDER — DEXTROSE 5 % IV SOLN: 1.0000 g | Freq: Once | INTRAVENOUS | Status: DC

## 2011-03-15 MED ORDER — DEXTROSE 5 % IV SOLN: INTRAVENOUS | Status: DC

## 2011-03-15 NOTE — Progress Notes (Signed)
Subjective: 55 year old with past medical history significant for chronic kidney disease, history of Chronic hydronephrosis, ureteral Stent who was admitted to the hospital on December 20 after being referred by her nephrologist to the emergency department for abnormal lab work. Her creatinine baseline is usually in the 7 range, her creatinine  the day of admission was at 16. Patient was admitted with progression renal failure and metabolic acidosis. She was also found to have leukocytosis at 35. - She underwent placement of a Diatek and began dialysis treatments urgently on 03/05/2011.  The nephrologist consultation note mentions the etiology of this patient's chronic kidney disease is related to bilateral hydronephrosis and reflux nephropathy. A renal ultrasound has been ordered his admission and is consistent with medical-renal disease, negative for hydronephrosis.  -She also has history of  recurrent  UTIs. She has been tolerating dialysis and has been set up for outpt dialysis.  Feels better   Objective: Filed Vitals:   03/14/11 1800 03/14/11 2020 03/14/11 2341 03/15/11 0515  BP: 119/73 122/62  132/78  Pulse: 80 84  72  Temp: 98.9 F (37.2 C) 98.9 F (37.2 C)  98.1 F (36.7 C)  TempSrc: Oral Oral  Oral  Resp: 19 18  20   Height:      Weight:   79 kg (174 lb 2.6 oz)   SpO2: 94% 96%  96%   Weight change: -1.25 kg (-2 lb 12.1 oz)  Intake/Output Summary (Last 24 hours) at 03/15/11 0833 Last data filed at 03/15/11 0700  Gross per 24 hour  Intake   1020 ml  Output   1766 ml  Net   -746 ml   Filed Vitals:   03/15/11 0515  BP: 132/78  Pulse: 72  Temp: 98.1 F (36.7 C)  Resp: 20  99 (37.2) 98.9 (37.2) 97.2 (36.2) Alert and oriented x3 Chest clear to auscultation without wheezes rhonchi crackles Heart regular the rhythm without murmurs rubs or gallops Left arm with decreased edema  Lab Results:  [facility] 03/14/11 0750  NA 132*  K 3.3*  CL 93*  CO2 22  GLUCOSE 84  BUN  43*  CREATININE 6.08*  CALCIUM 6.8*  MG --  PHOS 4.0    Basename 03/14/11 0750  AST --  ALT --  ALKPHOS --  BILITOT --  PROT --  ALBUMIN 1.7*    Basename 03/15/11 0705 03/14/11 0755  WBC 14.3* 18.7*  NEUTROABS -- --  HGB 9.1* 9.6*  HCT 28.4* 29.5*  MCV 90.4 88.6  PLT 225 236    Micro Results: Recent Results (from the past 240 hour(s))  URINE CULTURE     Status: Normal   Collection Time   03/09/11  8:17 PM      Component Value Range Status Comment   Specimen Description URINE, CLEAN CATCH   Final    Special Requests NONE   Final    Setup Time [id number]   Final    Colony Count >=100,000 COLONIES/ML   Final    Culture PSEUDOMONAS AERUGINOSA   Final    Report Status 03/12/2011 FINAL   Final    Organism ID, Bacteria PSEUDOMONAS AERUGINOSA   Final   PATHOLOGIST SMEAR REVIEW     Status: Normal   Collection Time   03/10/11  3:12 PM      Component Value Range Status Comment   Tech Review NORMOCYTIC ANEMIA. NEUTROPHILIA.   Final   URINE CULTURE     Status: Normal   Collection Time  03/11/11  1:42 PM      Component Value Range Status Comment   Specimen Description URINE, CLEAN CATCH   Final    Special Requests NONE   Final    Setup Time [id number]   Final    Colony Count >=100,000 COLONIES/ML   Final    Culture     Final    Value: Multiple bacterial morphotypes present, none predominant. Suggest appropriate recollection if clinically indicated.   Report Status 03/12/2011 FINAL   Final    PSEUDOMONAS AERUGINOSA 12/24      Antibiotic  Sensitivity  Microscan  Status    CEFEPIME  Sensitive  8  Final    Method:  MIC    CEFTAZIDIME  Resistant  >=64  Final    Method:  MIC    CIPROFLOXACIN  Sensitive  <=0.25  Final    Method:  MIC    GENTAMICIN  Sensitive  <=1  Final    Method:  MIC    IMIPENEM  Sensitive  <=1  Final    Method:  MIC    TOBRAMYCIN  Sensitive  <=1  Final    Method:  MIC     Comments  PSEUDOMONAS AERUGINOSA (MIC)      PSEUDOMONAS AERUGINOSA       Medications:     . ceFEPime (MAXIPIME) IV  1 g Intravenous Q24H  . cinacalcet  60 mg Oral 1 day or 1 dose  . darbepoetin (ARANESP) injection - DIALYSIS  200 mcg Intravenous Q Thu-HD  . multivitamin  1 tablet Oral Daily  . paricalcitol  4 mcg Intravenous 3 times weekly  . polyethylene glycol  17 g Oral BID  . DISCONTD: ceFEPime (MAXIPIME) IV  1 g Intravenous Q12H      Chronic kidney disease, stage V requiring chronic dialysis (03/05/2011) New dialysis patient.  Continue with Dialysis.  Metabolic acidosis ([id number])  Resolved.  Likely secondary to renal failure.   Hx of Hydronephrosis, bilateral (03/05/2011)  Renal ultrasound Done 12-21 showed :Severely echogenic kidneys bilaterally with cortical thinning compatible with chronic medical renal disease. Right ureteral stent visualized.  No hydronephrosis. She will have the stent removed as outpatient.   Leukocytosis (03/05/2011): Probably due to complicated pseudomonas aeruginosa urinary tract infection Patient received 5 days of ceftriaxone for UTI. Obviously was unsuccessful Procalcitonin level high . She denies cough, diarrhea. A chest x-ray was negative for infiltrates. Blood cultures obtained 12-20 no growth to date. Urine culture obtain on 12-19 no growth. Graft reviewed by vascular, no evidence of graft infection.  Vancomycin and Zosyn started 12/24 .  - Stopped on March 13, 2011 Plan for 2 weeks of iv cefepime start date 03/14/11    Anemia associated with chronic renal failure (03/06/2011) Hb 36month ago was at 10 . Continue with ararnesp, IV iron. guaiac stool pending. SP Transfused 1 unit 12-25. Blood loss during dialysis treatment 12-24. Bili Nl unlikely hemolysis. Anemia panel 12-20 showed: Iron at 73, saturation ratio 39, ferritin at 1188. c/w Anemia of chronic disease.  Hemoglobin has been stable no  Thrombocytosis (03/06/2011) probably reactive secondary to infection .   Discharge home   LOS: 11 days    Missy Baksh M.D.  [facility] 03/15/2011, 8:33 AM

## 2011-03-15 NOTE — Progress Notes (Signed)
Subjective: Mary Wilcox tells me she'll be going home ~4PM today.  Scheduled for HD tomorrow at [facility] at 11:30   Objective: Vital signs in last 24 hours: Blood pressure 115/75, pulse 74, temperature 98.3 F (36.8 C), temperature source Oral, resp. rate 18, height 5\' 1"  (1.549 m), weight 79 kg (174 lb 2.6 oz), SpO2 95.00%.   Intake/Output from previous day: 12/29 0701 - 12/30 0700 In: 1020 [P.O.:780; I.V.:240] Out: 1766  Intake/Output this shift: Total I/O In: 240 [P.O.:240] Out: 125 [Urine:100; Stool:25] Wt Readings from Last 3 Encounters:  03/14/11 79 kg (174 lb 2.6 oz)  03/14/11 79 kg (174 lb 2.6 oz)     PHYSICAL EXAM General--Awake, alert, friendly Chest--clear Heart--no rub Abd--nontender Extr--no edema, AVG patent in L upper arm  Lab Results:   Lab 03/14/11 0750 03/12/11 0758 03/11/11 0600  NA 132* 134* 133*  K 3.3* 3.3* 3.9  CL 93* 91* 92*  CO2 22 23 23   BUN 43* 61* 48*  CREATININE 6.08* 7.80* 6.46*  EGFR -- -- --  GLUCOSE 84 -- --  CALCIUM 6.8* 7.0* 7.8*  PHOS 4.0 5.5* 4.9*      Basename 03/15/11 0705 03/14/11 0755  WBC 14.3* 18.7*  HGB 9.1* 9.6*  HCT 28.4* 29.5*  PLT 225 236   PTH 708-- on zemplar 4 mcg each HD  Plus sensipar 60/d Hgb 9.1 (transfused earlier this week) Fe/TIBC 39%, ferritin 1188--on Aranesp 200/wk EDW 77 kg To get 2 weeks cefipime 2g each HD x 2 wk (start date 29 Dec)  Assessment/Plan:  1. ESRD for HD. Approved to start Monday 31 Dec at [facility] dialysis ctr [address] [facility] [address])11:30  AM shift. Dialyze there  tomorrow  2 . Anemia--aranesp 3. Secondary PTH Ca/phos 7/5.5 PTH 708 On zemplar + sensipar--no binder  4 . Pseudomonas UTI--sens to cefipime. She can get 2g each HD as outpt in dialysis center to complete course  5. Debility--better  OK for d/c   LOS: 11 days   [name] F 03/15/2011,2:06 PM

## 2011-03-15 NOTE — Progress Notes (Signed)
Discharge instructions reviewed with patient. Patient verbalized understanding. Patient waiting on ride at present.

## 2011-03-15 NOTE — Discharge Summary (Signed)
Patient ID: Mary Wilcox MRN: [id number] DOB/AGE: 57-Jun-1957 57 y.o. Primary Care Physician:No primary provider on file. Admit date: 03/04/2011 Discharge date: 03/15/2011    Discharge Diagnoses:   Principal Problem:  *Chronic kidney disease, stage V requiring chronic dialysis Active Problems:  Metabolic acidosis  Hydronephrosis, bilateral  Chronic UTI  Leukocytosis  Anemia associated with chronic renal failure  Thrombocytosis   Medication List  As of 03/15/2011  2:32 PM   START taking these medications         cinacalcet 30 MG tablet   Commonly known as: SENSIPAR   Take 2 tablets (60 mg total) by mouth 1 day or 1 dose.      dextrose 5 % SOLN 50 mL with ceFEPIme 1 G SOLR   2 grams with dialysis      oxyCODONE 5 MG immediate release tablet   Commonly known as: Oxy IR/ROXICODONE   Take 1 tablet (5 mg total) by mouth every 6 (six) hours as needed for pain.         CONTINUE taking these medications         multivitamin Tabs tablet      paricalcitol 1 MCG capsule   Commonly known as: ZEMPLAR          Where to get your medications    These are the prescriptions that you need to pick up.   You may get these medications from any pharmacy.         cinacalcet 30 MG tablet   oxyCODONE 5 MG immediate release tablet         Information on where to get these meds is not yet available. Ask your nurse or doctor.         dextrose 5 % SOLN 50 mL with ceFEPIme 1 G SOLR            Discharged Condition: Good  Consults: [facility], [facility]  Significant Diagnostic Studies: X-ray Chest Pa Or Ap  03/05/2011  *RADIOLOGY REPORT*  Clinical Data: Post dialysis catheter placement  CHEST - 1 VIEW  Comparison: Portable chest x-ray of 03/04/2011  Findings: A right-sided dialysis catheter is present with the tips at the expected SVC - RA junction.  No pneumothorax is seen.  The lungs are clear.  The heart is within normal limits in size.  Mediastinal contours are stable.  IMPRESSION: Dialysis catheter tips at SVC/RA junction.  No pneumothorax.  Original Report Authenticated By: Joretta Bachelor, M.D.   US Renal Port  03/06/2011  *RADIOLOGY REPORT*  Clinical Data: Renal failure.  RENAL/URINARY TRACT ULTRASOUND COMPLETE  Comparison:  [facility]. [facility] ultrasound 01/11/2007.  Findings:  Right Kidney:  9.1 cm.  Severely echogenic.  No hydronephrosis. Diffuse cortical thinning.  Left Kidney:  9.4 cm.  Severely echogenic.  Diffuse cortical thinning.  No hydronephrosis.  Bladder:  Unremarkable.  Right ureteral stent is seen extending into the bladder.  IMPRESSION: Severely echogenic kidneys bilaterally with cortical thinning compatible with chronic medical renal disease.  Right ureteral stent visualized.  No hydronephrosis.  Original Report Authenticated By: Raelyn Number, M.D.   Dg Chest Port 1 View  03/04/2011  *RADIOLOGY REPORT*  Clinical Data: Shortness of breath, elevated white count  PORTABLE CHEST - 1 VIEW  Comparison: [facility] chest radiograph dated 07/25/2004  Findings: Lungs are clear.  No pleural effusion or pneumothorax.  Cardiomediastinal silhouette is within normal limits.  IMPRESSION: No evidence of acute cardiopulmonary disease.  Original Report  Authenticated By: Julian Hy, M.D.   Dg Fluoro Guide Cv Line-no Report  03/05/2011  CLINICAL DATA: operative   FLOURO GUIDE CV LINE  Fluoroscopy was utilized by the requesting physician.  No radiographic  interpretation.      Lab Results: Results for orders placed during the hospital encounter of 03/04/11 (from the past 48 hour(s))  RENAL FUNCTION PANEL     Status: Abnormal   Collection Time   03/14/11  7:50 AM      Component Value Range Comment   Sodium 132 (*) 135 - 145 (mEq/L)    Potassium 3.3 (*) 3.5 - 5.1 (mEq/L)    Chloride 93 (*) 96 - 112 (mEq/L)    CO2 22  19 - 32 (mEq/L)    Glucose, Bld 84  70 - 99 (mg/dL)    BUN 43 (*) 6 - 23  (mg/dL)    Creatinine, Ser 6.08 (*) 0.50 - 1.10 (mg/dL)    Calcium 6.8 (*) 8.4 - 10.5 (mg/dL)    Phosphorus 4.0  2.3 - 4.6 (mg/dL)    Albumin 1.7 (*) 3.5 - 5.2 (g/dL)    GFR calc non Af Amer 7 (*) >90 (mL/min)    GFR calc Af Amer 8 (*) >90 (mL/min)   CBC     Status: Abnormal   Collection Time   03/14/11  7:55 AM      Component Value Range Comment   WBC 18.7 (*) 4.0 - 10.5 (K/uL)    RBC 3.33 (*) 3.87 - 5.11 (MIL/uL)    Hemoglobin 9.6 (*) 12.0 - 15.0 (g/dL) POST TRANSFUSION SPECIMEN   HCT 29.5 (*) 36.0 - 46.0 (%)    MCV 88.6  78.0 - 100.0 (fL)    MCH 28.8  26.0 - 34.0 (pg)    MCHC 32.5  30.0 - 36.0 (g/dL)    RDW 17.2 (*) 11.5 - 15.5 (%)    Platelets 236  150 - 400 (K/uL)   CBC     Status: Abnormal   Collection Time   03/15/11  7:05 AM      Component Value Range Comment   WBC 14.3 (*) 4.0 - 10.5 (K/uL)    RBC 3.14 (*) 3.87 - 5.11 (MIL/uL)    Hemoglobin 9.1 (*) 12.0 - 15.0 (g/dL)    HCT 28.4 (*) 36.0 - 46.0 (%)    MCV 90.4  78.0 - 100.0 (fL)    MCH 29.0  26.0 - 34.0 (pg)    MCHC 32.0  30.0 - 36.0 (g/dL)    RDW 17.1 (*) 11.5 - 15.5 (%)    Platelets 225  150 - 400 (K/uL)    Recent Results (from the past 240 hour(s))  URINE CULTURE     Status: Normal   Collection Time   03/09/11  8:17 PM      Component Value Range Status Comment   Specimen Description URINE, CLEAN CATCH   Final    Special Requests NONE   Final    Setup Time [id number]   Final    Colony Count >=100,000 COLONIES/ML   Final    Culture PSEUDOMONAS AERUGINOSA   Final    Report Status 03/12/2011 FINAL   Final    Organism ID, Bacteria PSEUDOMONAS AERUGINOSA   Final   PATHOLOGIST SMEAR REVIEW     Status: Normal   Collection Time   03/10/11  3:12 PM      Component Value Range Status Comment   Tech Review NORMOCYTIC ANEMIA. NEUTROPHILIA.   Final  URINE CULTURE     Status: Normal   Collection Time   03/11/11  1:42 PM      Component Value Range Status Comment   Specimen Description URINE, CLEAN CATCH   Final      Special Requests NONE   Final    Setup Time [id number]   Final    Colony Count >=100,000 COLONIES/ML   Final    Culture     Final    Value: Multiple bacterial morphotypes present, none predominant. Suggest appropriate recollection if clinically indicated.   Report Status 03/12/2011 FINAL   Final      Hospital Course:  54 year old with past medical history significant for chronic kidney disease, history of Chronic hydronephrosis, ureteral Stent who was admitted to the hospital on December 20 after being referred by her nephrologist to the emergency department for abnormal lab work. Her creatinine baseline is usually in the 7 range, her creatinine the day of admission was at 16. Patient was admitted with progression renal failure and metabolic acidosis. She was also found to have leukocytosis at 35,000. -  She underwent placement of a Diatek and began dialysis treatments urgently on 03/05/2011. The nephrologist consultation note mentions the etiology of this patient's chronic kidney disease is related to bilateral hydronephrosis and reflux nephropathy. A renal ultrasound has been ordered his admission and is consistent with medical-renal disease, negative for hydronephrosis.     Chronic kidney disease, stage V now requiring chronic dialysis  New dialysis patient.  The patient has been set up for Tuesday Thursday Saturday at [facility] dialysis center  Metabolic acidosis  Resolved with dialysis  Likely secondary to renal failure.   Hx of Hydronephrosis, bilateral Renal ultrasound Done 12-21 showed :Severely echogenic kidneys bilaterally with cortical thinning compatible with chronic medical renal disease. Right ureteral stent visualized, but no new hydronephrosis.  The patient was evaluated by Dr. Risa Grill and she will have the stent removed as outpatient.   Leukocytosis (03/05/2011): Probably due to complicated pseudomonas aeruginosa urinary tract infection  Patient received 5  days of ceftriaxone for UTI. Obviously was unsuccessful  Procalcitonin level was found elevated . She denied cough or diarrhea. A chest x-ray was negative for infiltrates. Blood cultures obtained 12-20 did not grow anything. Urine culture obtain on 12/24 grew Pseudomonas aeruginosa. Graft reviewed by vascular, no evidence of graft infection.  Vancomycin and Zosyn started 12/24 . - Stopped on March 13, 2011  Plan for 2 weeks of iv cefepime start date 03/14/11    Anemia associated with chronic renal failure (03/06/2011) Hb 57month ago was at 10 . Continue with ararnesp, IV iron. guaiac stool pending. SP Transfused 1 unit 12-25. Blood loss during dialysis treatment 12-24.  Anemia panel 12-20 showed: Iron at 73, saturation ratio 39, ferritin at 1188. c/w Anemia of chronic kidney disease.  Hemoglobin has been stable for 48 hours prior to discharge.  Thrombocytosis (03/06/2011) probably reactive secondary to infection .        Discharge Exam: Blood pressure 120/78, pulse 76, temperature 98.4 F (36.9 C), temperature source Oral, resp. rate 18, height 5\' 1"  (1.549 m), weight 79 kg (174 lb 2.6 oz), SpO2 96.00%. Alert oriented x3 Chest clear to auscultation without wheeze or rhonchi crackles Heart regular rate and rhythm without murmurs rubs or gallops Abdomen soft nontender bowel sounds are present Left arm with decreased edema decreased ecchymosis  Disposition: Home Time spent preparing this discharge 40 minutes  Discharge Orders    Future Orders Please  Complete By Expires   Diet renal 60/70-04-17-1198      Increase activity slowly         Follow-up Information    Follow up with DUNHAM,CYNTHIA B, MD. (at dialysis unit)    Contact information:   [place]       Follow up with Bernestine Amass, MD. Make an appointment in 1 week.   Contact information:   [place] [place] [phone]          Signed: Edythe Lynn 03/15/2011, 2:32 PM

## 2011-03-16 NOTE — Progress Notes (Signed)
   CARE MANAGEMENT NOTE 03/16/2011  Patient:  JOLIANNA, ENDRES   Account Number:  [id number]  Date Initiated:  03/05/2011  Documentation initiated by:  Lars Pinks  Subjective/Objective Assessment:   PT WAS ADMITTED WITH CREAT OF 16 AND BICARD OF 7     Action/Plan:   PROGRESSION OF CARE AND DISCHARGE PLANNING   Anticipated DC Date:  03/09/2011   Anticipated DC Plan:  [place]  CM consult      Choice offered to / List presented to:             Status of service:  Completed, signed off Medicare Important Message given?   (If response is "NO", the following Medicare IM given date fields will be blank) Date Medicare IM given:   Date Additional Medicare IM given:    Discharge Disposition:  HOME/SELF CARE  Per UR Regulation:  Reviewed for med. necessity/level of care/duration of stay  Comments:  03/11/11- 1445- Marvetta Gibbons RN, BSN [phone] Pt is new HD pt, renal following. PTA pt lived at home alone. CM to follow for d/c needs  UR COMPLETED 03/05/2011 Iya Hamed, RN,BSN 1447 PT WAS ADMITTED AND PLACED ON BICARB GTT, WILL F/U ON DC NEEDS

## 2011-04-11 ENCOUNTER — Encounter: Payer: Self-pay | Admitting: Nephrology

## 2011-04-11 ENCOUNTER — Observation Stay (HOSPITAL_COMMUNITY)
Admission: EM | Admit: 2011-04-11 | Discharge: 2011-04-12 | DRG: 395 | Disposition: A | Discharge status: Home / Self Care | Payer: BC Managed Care – PPO | Attending: Nephrology | Admitting: Nephrology

## 2011-04-11 ENCOUNTER — Emergency Department (HOSPITAL_COMMUNITY): Payer: BC Managed Care – PPO

## 2011-04-11 ENCOUNTER — Other Ambulatory Visit: Payer: Self-pay | Admitting: Nephrology

## 2011-04-11 ENCOUNTER — Inpatient Hospital Stay (HOSPITAL_COMMUNITY): Admission: AD | Admit: 2011-04-11 | Payer: Self-pay | Source: Ambulatory Visit | Admitting: Nephrology

## 2011-04-11 DIAGNOSIS — N2581 Secondary hyperparathyroidism of renal origin: Secondary | ICD-10-CM | POA: Insufficient documentation

## 2011-04-11 DIAGNOSIS — Z992 Dependence on renal dialysis: Secondary | ICD-10-CM | POA: Insufficient documentation

## 2011-04-11 DIAGNOSIS — R509 Fever, unspecified: Secondary | ICD-10-CM | POA: Insufficient documentation

## 2011-04-11 DIAGNOSIS — I120 Hypertensive chronic kidney disease with stage 5 chronic kidney disease or end stage renal disease: Secondary | ICD-10-CM | POA: Insufficient documentation

## 2011-04-11 DIAGNOSIS — D631 Anemia in chronic kidney disease: Principal | ICD-10-CM | POA: Insufficient documentation

## 2011-04-11 DIAGNOSIS — E46 Unspecified protein-calorie malnutrition: Secondary | ICD-10-CM | POA: Insufficient documentation

## 2011-04-11 DIAGNOSIS — N186 End stage renal disease: Secondary | ICD-10-CM | POA: Insufficient documentation

## 2011-04-11 DIAGNOSIS — D72829 Elevated white blood cell count, unspecified: Secondary | ICD-10-CM

## 2011-04-11 DIAGNOSIS — N390 Urinary tract infection, site not specified: Secondary | ICD-10-CM | POA: Insufficient documentation

## 2011-04-11 HISTORY — DX: Secondary hyperparathyroidism of renal origin: N25.81

## 2011-04-11 HISTORY — DX: Malignant neoplasm of cervix uteri, unspecified: C53.9

## 2011-04-11 HISTORY — DX: Iron deficiency anemia, unspecified: D50.9

## 2011-04-11 HISTORY — DX: Personal history of other medical treatment: Z92.89

## 2011-04-11 HISTORY — DX: Unspecified hydronephrosis: N13.30

## 2011-04-11 LAB — MRSA PCR SCREENING: MRSA by PCR: NEGATIVE

## 2011-04-11 LAB — PREPARE RBC (CROSSMATCH)

## 2011-04-11 LAB — CBC
HCT: 21.8 % — ABNORMAL LOW (ref 36.0–46.0)
Hemoglobin: 6.8 g/dL — CL (ref 12.0–15.0)
MCH: 28.6 pg (ref 26.0–34.0)
MCHC: 30.3 g/dL (ref 30.0–36.0)
MCV: 94.4 fL (ref 78.0–100.0)
Platelets: 233 10*3/uL (ref 150–400)
RBC: 2.31 MIL/uL — ABNORMAL LOW (ref 3.87–5.11)
RDW: 19.4 % — ABNORMAL HIGH (ref 11.5–15.5)
WBC: 18.7 10*3/uL — ABNORMAL HIGH (ref 4.0–10.5)

## 2011-04-11 LAB — BASIC METABOLIC PANEL
BUN: 8 mg/dL (ref 6–23)
CO2: 29 mEq/L (ref 19–32)
Calcium: 8.3 mg/dL — ABNORMAL LOW (ref 8.4–10.5)
Chloride: 98 mEq/L (ref 96–112)
Creatinine, Ser: 2.27 mg/dL — ABNORMAL HIGH (ref 0.50–1.10)
GFR calc Af Amer: 26 mL/min — ABNORMAL LOW (ref >90)
GFR calc non Af Amer: 23 mL/min — ABNORMAL LOW (ref >90)
Glucose, Bld: 112 mg/dL — ABNORMAL HIGH (ref 70–99)
Potassium: 3.7 mEq/L (ref 3.5–5.1)
Sodium: 137 mEq/L (ref 135–145)

## 2011-04-11 IMAGING — CR DG CHEST 2V
2 series · 2 of 2 positions shown · non-contrast
Comparison: [DATE] and earlier.

CLINICAL DATA: 55-year-old female with low grade fever, cough,
anemia.

CHEST - 2 VIEW

[w chest pa]
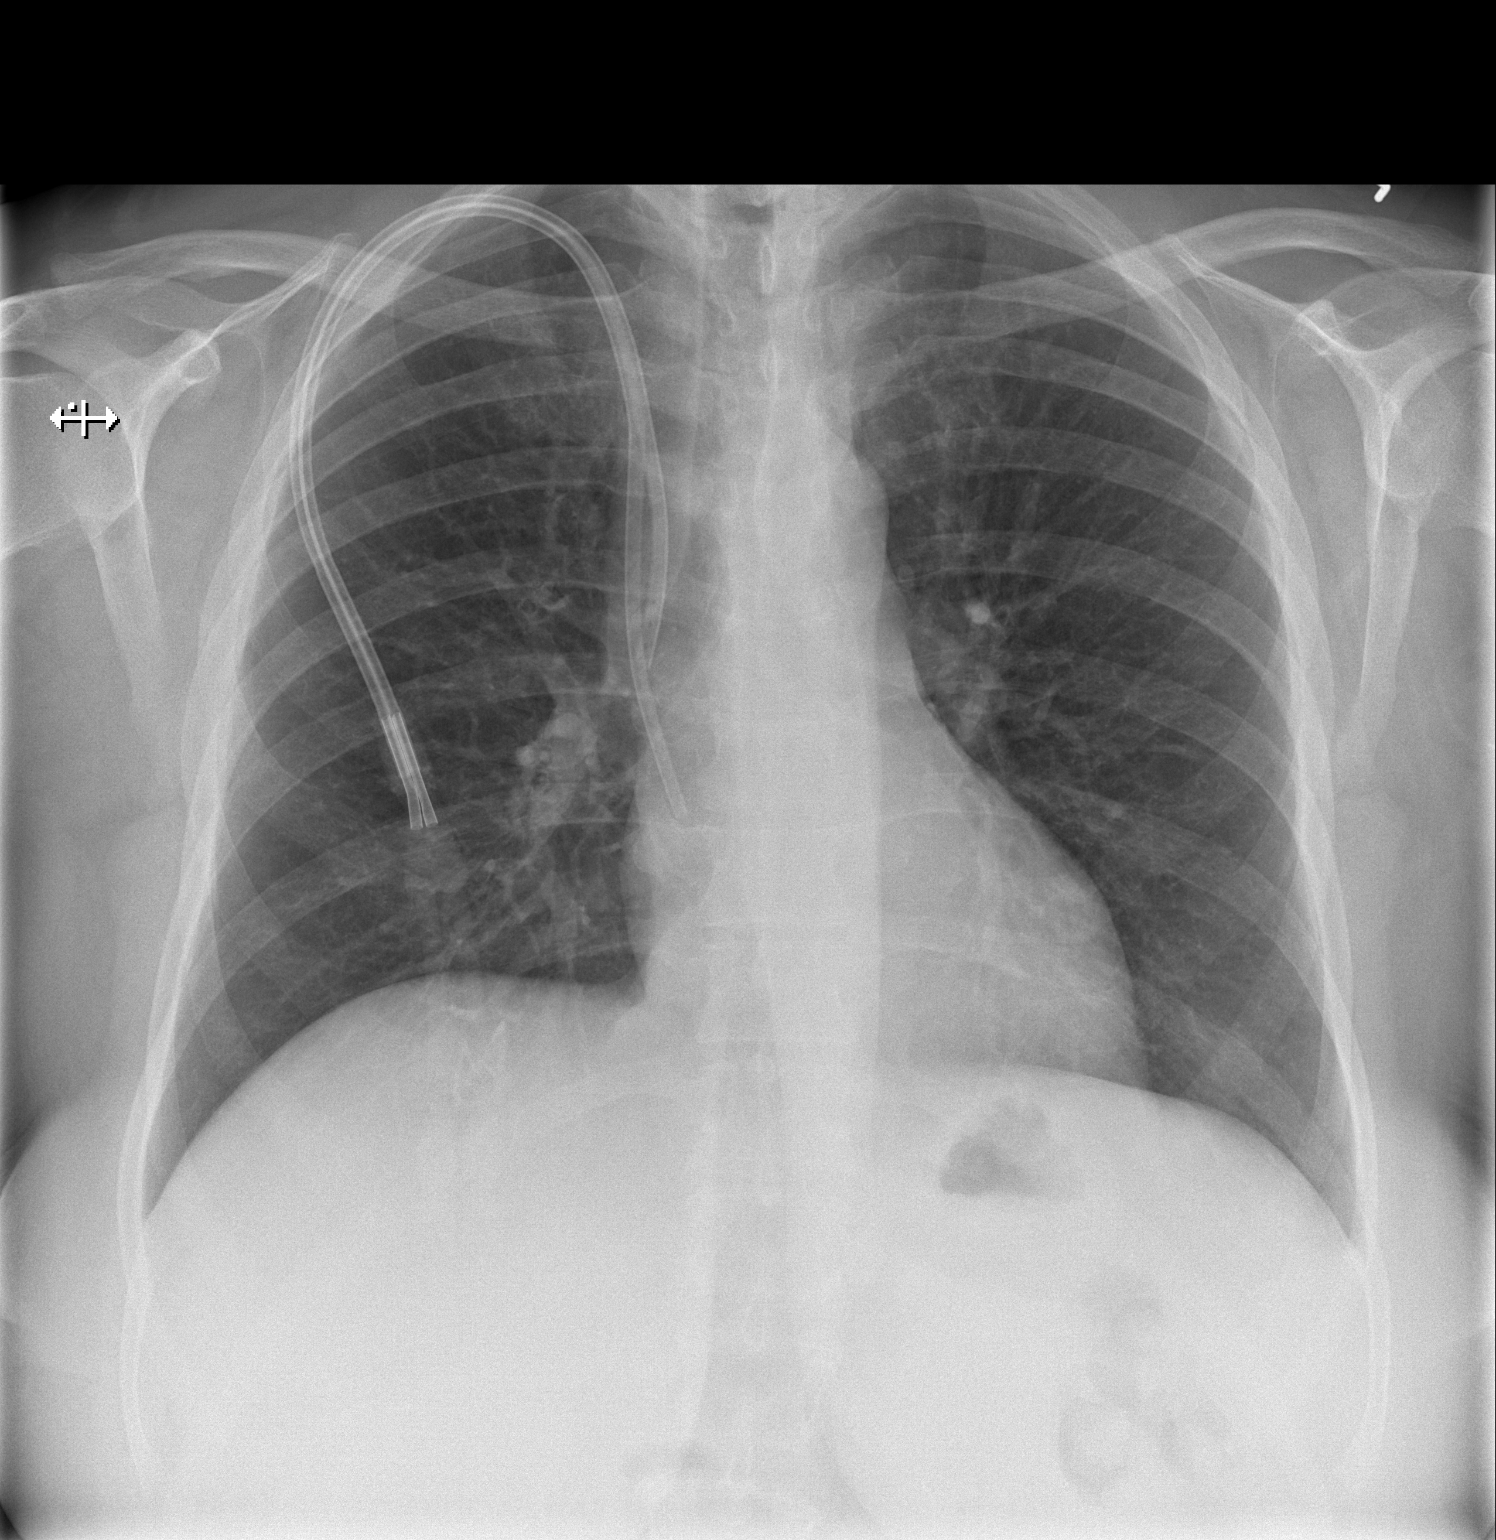

[w chest lat]
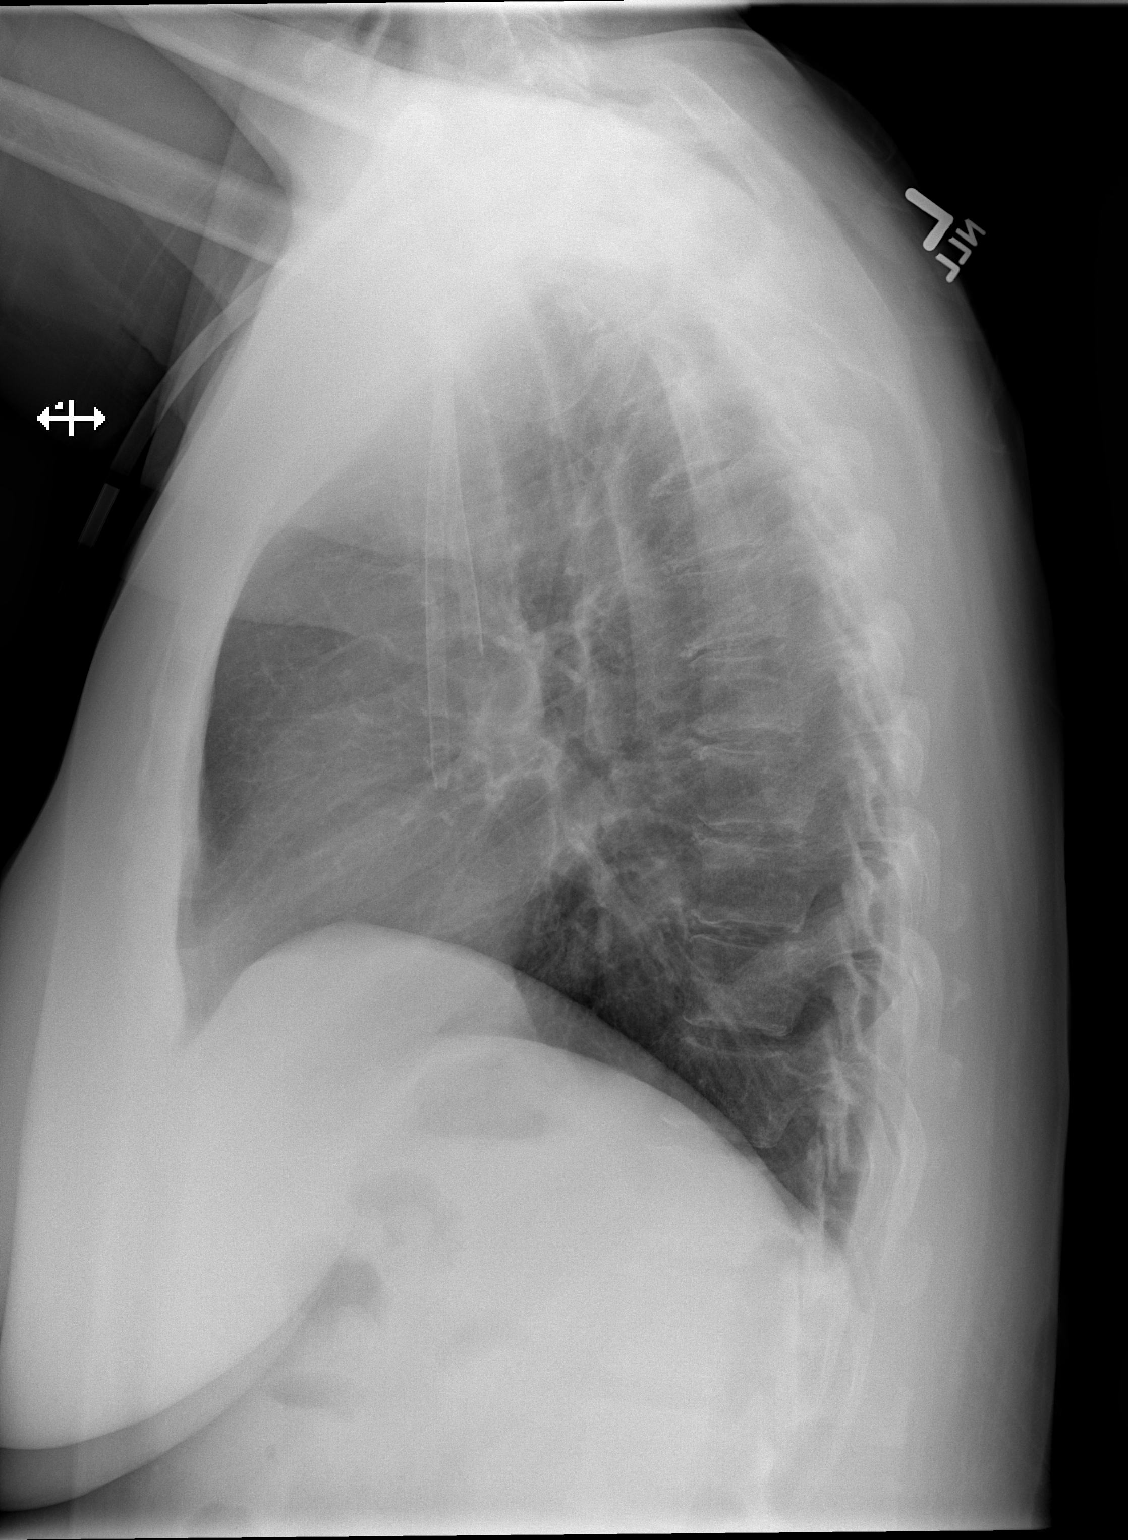

[2 of 2 positions shown; findings below may reference images not displayed]

FINDINGS: Stable right chest tunneled IJ dual lumen dialysis type
catheter.  Normal lung volumes.  The lungs are clear.  No
pneumothorax or effusion.  Cardiac size and mediastinal contours
are within normal limits.  No acute osseous abnormality identified.
IMPRESSION: No acute cardiopulmonary abnormality.

## 2011-04-11 MED ORDER — RENA-VITE PO TABS
1.0000 | ORAL_TABLET | Freq: Every day | ORAL | Status: DC
  Administered 2011-04-11 – 2011-04-12 (×2): 1 via ORAL
  Filled 2011-04-11 (×2): qty 1

## 2011-04-11 MED ORDER — SODIUM CHLORIDE 0.9 % IV SOLN
250.0000 mL | INTRAVENOUS | Status: DC | PRN
  Administered 2011-04-11 (×2): 250 mL via INTRAVENOUS

## 2011-04-11 MED ORDER — ACETAMINOPHEN 325 MG PO TABS
650.0000 mg | ORAL_TABLET | Freq: Four times a day (QID) | ORAL | Status: DC | PRN
  Administered 2011-04-12: 650 mg via ORAL
  Filled 2011-04-11: qty 2

## 2011-04-11 MED ORDER — ACETAMINOPHEN 325 MG PO TABS
650.0000 mg | ORAL_TABLET | Freq: Once | ORAL | Status: AC
  Administered 2011-04-11: 650 mg via ORAL
  Filled 2011-04-11: qty 2

## 2011-04-11 MED ORDER — NEPRO/CARBSTEADY PO LIQD: 237.0000 mL | Freq: Three times a day (TID) | ORAL | Status: DC | PRN

## 2011-04-11 MED ORDER — ZOLPIDEM TARTRATE 5 MG PO TABS: 5.0000 mg | ORAL_TABLET | Freq: Every evening | ORAL | Status: DC | PRN

## 2011-04-11 MED ORDER — CIPROFLOXACIN IN D5W 400 MG/200ML IV SOLN
400.0000 mg | INTRAVENOUS | Status: DC
  Administered 2011-04-11: 400 mg via INTRAVENOUS
  Filled 2011-04-11 (×2): qty 200

## 2011-04-11 MED ORDER — CALCIUM CARBONATE 1250 MG/5ML PO SUSP: 500.0000 mg | Freq: Four times a day (QID) | ORAL | Status: DC | PRN

## 2011-04-11 MED ORDER — CAMPHOR-MENTHOL 0.5-0.5 % EX LOTN
1.0000 "application " | TOPICAL_LOTION | Freq: Three times a day (TID) | CUTANEOUS | Status: DC | PRN
  Filled 2011-04-11: qty 222

## 2011-04-11 MED ORDER — VANCOMYCIN HCL 1000 MG IV SOLR: 750.0000 mg | INTRAVENOUS | Status: DC

## 2011-04-11 MED ORDER — ONDANSETRON HCL 4 MG/2ML IJ SOLN: 4.0000 mg | Freq: Four times a day (QID) | INTRAMUSCULAR | Status: DC | PRN

## 2011-04-11 MED ORDER — SODIUM CHLORIDE 0.9 % IJ SOLN: 3.0000 mL | INTRAMUSCULAR | Status: DC | PRN

## 2011-04-11 MED ORDER — ZOLPIDEM TARTRATE 5 MG PO TABS
5.0000 mg | ORAL_TABLET | Freq: Every day | ORAL | Status: DC
  Administered 2011-04-11: 5 mg via ORAL
  Filled 2011-04-11: qty 1

## 2011-04-11 MED ORDER — SODIUM CHLORIDE 0.9 % IJ SOLN
3.0000 mL | Freq: Two times a day (BID) | INTRAMUSCULAR | Status: DC
  Administered 2011-04-12: 3 mL via INTRAVENOUS

## 2011-04-11 MED ORDER — ACETAMINOPHEN 650 MG RE SUPP: 650.0000 mg | Freq: Four times a day (QID) | RECTAL | Status: DC | PRN

## 2011-04-11 MED ORDER — ONDANSETRON HCL 4 MG PO TABS: 4.0000 mg | ORAL_TABLET | Freq: Four times a day (QID) | ORAL | Status: DC | PRN

## 2011-04-11 MED ORDER — ZOLPIDEM TARTRATE 5 MG PO TABS: 10.0000 mg | ORAL_TABLET | Freq: Every day | ORAL | Status: DC

## 2011-04-11 MED ORDER — VANCOMYCIN HCL 1000 MG IV SOLR
1500.0000 mg | INTRAVENOUS | Status: AC
  Administered 2011-04-11: 1500 mg via INTRAVENOUS
  Filled 2011-04-11: qty 1500

## 2011-04-11 MED ORDER — HYDROXYZINE HCL 25 MG PO TABS: 25.0000 mg | ORAL_TABLET | Freq: Three times a day (TID) | ORAL | Status: DC | PRN

## 2011-04-11 MED ORDER — DIPHENHYDRAMINE HCL 25 MG PO CAPS
25.0000 mg | ORAL_CAPSULE | Freq: Once | ORAL | Status: AC
  Administered 2011-04-11: 25 mg via ORAL
  Filled 2011-04-11: qty 1

## 2011-04-11 NOTE — ED Notes (Signed)
Patient was at dialysis and her hemoglobin test result came back and it was 6.5.  Patient complain of being weak.

## 2011-04-11 NOTE — Progress Notes (Signed)
ANTIBIOTIC CONSULT NOTE - INITIAL  Pharmacy Consult for vancomycin Indication: fever (? HD graft infection)  Allergies  Allergen Reactions  . Prozac (Fluoxetine Hcl) Hives    Patient Measurements:   Adjusted Body Weight:   Vital Signs: Temp: 99.7 F (37.6 C) (01/26 1558) Temp src: Oral (01/26 1558) BP: 142/77 mmHg (01/26 1558) Pulse Rate: 106  (01/26 1558) Intake/Output from previous day:   Intake/Output from this shift:    Labs:  [facility] 04/11/11 1639  WBC 18.7*  HGB 6.8*  PLT 233  LABCREA --  CREATININE --   The CrCl is unknown because both a height and weight (above a minimum accepted value) are required for this calculation. No results found for this basename: VANCOTROUGH:2,VANCOPEAK:2,VANCORANDOM:2,GENTTROUGH:2,GENTPEAK:2,GENTRANDOM:2,TOBRATROUGH:2,TOBRAPEAK:2,TOBRARND:2,AMIKACINPEAK:2,AMIKACINTROU:2,AMIKACIN:2, in the last 72 hours   Microbiology: No results found for this or any previous visit (from the past 720 hour(s)).  Medical History: Past Medical History  Diagnosis Date  . Chronic kidney disease     hx several episodes of ARF secondary to obstructive uropathy  . Cervical cancer 2006    IIIB s/p ext radiation and intracavitary cessium  . Secondary hyperparathyroidism (of renal origin)   . Iron deficiency anemia   . Hypertension   . Bilateral hydronephrosis 2006    due to obstruction from cervical cancer  . Transfusion history     02/2011 for Hgb 6.8    Assessment: Patient is a 56 y.o F with ESRD (on HD Tue, Thurs, Sat schedule) admitted to the ED today with fever and anemia (hgb 6.8).  She received ~ 4 hours of HD today PTA.  Goal of Therapy:  Pre-HD vancomycin level= 15-25  Plan:  1) Vancomycin 1500 mg IV x1 load today 2) Vancomycin 750 mg IV after each HD session  Lowella Kindley P 04/11/2011,5:02 PM

## 2011-04-11 NOTE — H&P (Signed)
[facility] History and Physical  Cc: Need for blood transfusion HPI:  Mary Wilcox is a 56 y.o. female with ESRD secondary to obstructive uropathy who started on dialysis in December 2012.  Hgb at discharge from [facility] was 9.1 12/30 with tsat of 39%.  She was discharged on high dose Epogen.  Hgb has failed to significantly increase and then recently dropped significantly with monthly labs down to 6.9.  BP is not low, but patient has fallen several times over the past week or so.  On one instance she cut her head and presented to dialysis with dried blood on hair and face and looking disheveled by dialysis report.  She refused ED evaluation.  She states one fall was due to running to the BR on tip toes and the other was off the couch due to "tangled legs." She returned stool cards that were negative.  However, her tsat for January had dropped to 26%.  Ferritin in December was 1188 12/20. January 24th was 786.  Of note was that Hgb also dropped to 6.8 in December 27th  and was transfused three units during that time frame.    She was to have come directly to the hospital after dialysis today, but went home and took a nap and came late after she was called by the RN from her dialysis center.  In the ED she reports a chronic cough for about 10-12 days since her ureteral stents were removed and states the wound on her left arm graft has "been that way since it was put in".  She denies fever, chills, dysuria or urgency, sob, DOE, CP, dizziness, ringing of the ears, N,V,D or constipation, numbness or tingling of extremities or any pain.  Appetite is improving. She does have itching.  Dialysis Orders: Center: [facility]   on TTS EDW 75 HD Bath 3 2.25 Ca  Time 4 Heparin 4000 Access right I-J and left upper AVGG BFR 400  DFR A 1.5   Zemplar 39mcg IV/HD Epogen 28K  Units IV/HD  Venofer  100 x 10  started 1/26  Past Medical History  Diagnosis Date  . Chronic kidney disease     hx several episodes  of ARF secondary to obstructive uropathy  . Cervical cancer 2006    IIIB s/p ext radiation and intracavitary cessium  . Secondary hyperparathyroidism (of renal origin)   . Iron deficiency anemia   . Hypertension   . Bilateral hydronephrosis 2006    due to obstruction from cervical cancer  . Transfusion history     02/2011 for Hgb 6.8   Past Surgical History  Procedure Date  . Ureter surgery     stent, Dr. Risa Grill for bilateral hydro  . Tubal ligation   . Insertion of dialysis catheter 03/05/2011    Procedure: INSERTION OF DIALYSIS CATHETER;  Surgeon: Angelia Mould, MD;  Location: [place];  Service: Vascular;  Laterality: Right;  start 1041- finish 1051  . Av fistula placement 03/05/2011    Procedure: INSERTION OF ARTERIOVENOUS (AV) GORE-TEX GRAFT ARM;  Surgeon: Angelia Mould, MD;  Location[facility];  Service: Vascular;  Laterality: Left;  start 1115   finish 1250   Family History  Problem Relation Age of Onset  . Lung cancer Father   . Cancer Mother     kidney and thyroid   Social History:  reports that she has never smoked. She does not have any smokeless tobacco history on file. She reports that she  does not drink alcohol or use illicit drugs. Allergies  Allergen Reactions  . Prozac (Fluoxetine Hcl) Hives   Prior to Admission medications   Medication Sig Start Date End Date Taking? Authorizing Provider  multivitamin (RENA-VIT) TABS tablet Take 1 tablet by mouth daily.     Yes Historical Provider, MD  paricalcitol (ZEMPLAR) 1 MCG capsule Take 1 mcg by mouth daily.     Yes Historical Provider, MD  zolpidem (AMBIEN) 10 MG tablet Take 10 mg by mouth at bedtime as needed. For sleep.   Yes Historical Provider, MD   I have reviewed the patient's current medications She thinks she is taking zemplar everyother day, but is not taking sensipar which was on the hospital d/c list.    Results for orders placed during the hospital encounter of 04/11/11 (from the past 48  hour(s))  PREPARE RBC (CROSSMATCH)     Status: Normal   Collection Time   04/11/11  4:30 PM      Component Value Range Comment   Order Confirmation ORDER PROCESSED BY BLOOD BANK     BASIC METABOLIC PANEL     Status: Abnormal   Collection Time   04/11/11  4:39 PM      Component Value Range Comment   Sodium 137  135 - 145 (mEq/L)    Potassium 3.7  3.5 - 5.1 (mEq/L)    Chloride 98  96 - 112 (mEq/L)    CO2 29  19 - 32 (mEq/L)    Glucose, Bld 112 (*) 70 - 99 (mg/dL)    BUN 8  6 - 23 (mg/dL)    Creatinine, Ser 2.27 (*) 0.50 - 1.10 (mg/dL)    Calcium 8.3 (*) 8.4 - 10.5 (mg/dL)    GFR calc non Af Amer 23 (*) >90 (mL/min)    GFR calc Af Amer 26 (*) >90 (mL/min)   CBC     Status: Abnormal   Collection Time   04/11/11  4:39 PM      Component Value Range Comment   WBC 18.7 (*) 4.0 - 10.5 (K/uL)    RBC 2.31 (*) 3.87 - 5.11 (MIL/uL)    Hemoglobin 6.8 (*) 12.0 - 15.0 (g/dL)    HCT 21.8 (*) 36.0 - 46.0 (%)    MCV 94.4  78.0 - 100.0 (fL)    MCH 28.6  26.0 - 34.0 (pg)    MCHC 30.3  30.0 - 36.0 (g/dL)    RDW 19.4 (*) 11.5 - 15.5 (%)    Platelets 233  150 - 400 (K/uL)    ROS: as per HPI.  Physical Exam: Filed Vitals:   04/11/11 1558  BP: 142/77  Pulse: 106  Temp: 99.7 F (37.6 C)  Resp: 20     General: pale NAD on ED stretcher HEENT: no glossitis, sclera clear, missing teeth, MMM Neck: no LAD, no JVD   Heart: tachy regular Lungs: grossly CTA bilaterally Abdomen: obese soft NT NT Extremities: trace LE edema;  Neuro: A & O x 3 Psych:  Initially affect flat, but more engaging with conversation Dialysis Access: left AVGG distal antecubital area open, inflammed with sticky drainage; + bruit and thrill         Right I-J cath site dry, slight erythema, no drainage    Assessment/Plan: 1. Anemia - plan for transfusion 2 units PRBC tonight; check hemocult again 2. Fever/leukocytosis - found incidentally upon arrival.  Possible sources include graft, urine, blood or respiratory; I suspect  graft and/or urine. Panculture. Emperic Vanc  and cipro.  She had a pseudomonas UTI in Dec that was sensitive to cipro and resistant to fortaz. CXR NAD.  Will contact [facility] in the am to evaluate access.  3. Hypertension/volume  - not on BP meds. I think she needs more volume removed. 4. ESRD -  TTS; s/p HD today; check BMP tonight and in am after transfusion 5. Metabolic bone disease -  Apparently taking po zemplar and getting IV Zemplar: P controlled without binders; IV zemplar only 6. Nutrition - high protein renal diet Alb 2.5 1/24  7. Hx bilateral ureteral stents removed 1/14 Dr. Risa Grill; [place] in December showed no hydro 8. Hx stage III B cervical cancer s/p ext radiation and intracavitary cessium 2006 - follow-up appt with Dr. Earlie Server in February; Has been a now show 3 times and this is her last opportunity or she will be dropped from the practice. - per dialysis notes  Myriam Jacobson, PA-C [place] [phone] 04/11/2011, 5:43 PM

## 2011-04-12 LAB — URINALYSIS, ROUTINE W REFLEX MICROSCOPIC
Bilirubin Urine: NEGATIVE
Glucose, UA: NEGATIVE mg/dL
Ketones, ur: NEGATIVE mg/dL
Nitrite: NEGATIVE
Protein, ur: 100 mg/dL — AB
Specific Gravity, Urine: 1.008 (ref 1.005–1.030)
Urobilinogen, UA: 0.2 mg/dL (ref 0.0–1.0)
pH: 8.5 — ABNORMAL HIGH (ref 5.0–8.0)

## 2011-04-12 LAB — BASIC METABOLIC PANEL
BUN: 12 mg/dL (ref 6–23)
CO2: 28 mEq/L (ref 19–32)
Calcium: 8.5 mg/dL (ref 8.4–10.5)
Chloride: 96 mEq/L (ref 96–112)
Creatinine, Ser: 3.36 mg/dL — ABNORMAL HIGH (ref 0.50–1.10)
GFR calc Af Amer: 16 mL/min — ABNORMAL LOW (ref >90)
GFR calc non Af Amer: 14 mL/min — ABNORMAL LOW (ref >90)
Glucose, Bld: 83 mg/dL (ref 70–99)
Potassium: 3.6 mEq/L (ref 3.5–5.1)
Sodium: 134 mEq/L — ABNORMAL LOW (ref 135–145)

## 2011-04-12 LAB — URINE MICROSCOPIC-ADD ON

## 2011-04-12 MED ORDER — CIPROFLOXACIN HCL 500 MG PO TABS: 500.0000 mg | ORAL_TABLET | ORAL | Status: AC

## 2011-04-12 MED ORDER — AMOXICILLIN-POT CLAVULANATE 500-125 MG PO TABS: 1.0000 | ORAL_TABLET | Freq: Two times a day (BID) | ORAL | Status: AC

## 2011-04-12 MED ORDER — ZOLPIDEM TARTRATE 5 MG PO TABS: 5.0000 mg | ORAL_TABLET | Freq: Every evening | ORAL | Status: DC | PRN

## 2011-04-12 NOTE — Consult Note (Signed)
Vascular and Vein Specialist of [facility]  Vascular Consult Note    Patient name: Mary Wilcox MRN: [id number] DOB: 10-26-1955 Sex: female   Referred by: Renal service  Reason for referral: Evaluation of left upper arm AV graft  HISTORY OF PRESENT ILLNESS: The patient is a 56 year old white female needed to hemodialysis. She had a right IJ hemodialysis catheter and left upper arm loop graft placed by Dr. Scot Dock on 03/05/2011. She was admitted for transfusion due to anemia and we're asked to see her regarding wound issues in her access. According to the history and physical she has been poorly compliant regarding dialysis and followup.  Past Medical History  Diagnosis Date  . Chronic kidney disease     hx several episodes of ARF secondary to obstructive uropathy  . Cervical cancer 2006    IIIB s/p ext radiation and intracavitary cessium  . Secondary hyperparathyroidism (of renal origin)   . Iron deficiency anemia   . Hypertension   . Bilateral hydronephrosis 2006    due to obstruction from cervical cancer  . Transfusion history     02/2011 for Hgb 6.8    Past Surgical History  Procedure Date  . Ureter surgery     stent, Dr. Risa Grill for bilateral hydro  . Tubal ligation   . Insertion of dialysis catheter 03/05/2011    Procedure: INSERTION OF DIALYSIS CATHETER;  Surgeon: Angelia Mould, MD;  Location: [place];  Service: Vascular;  Laterality: Right;  start 1041- finish 1051  . Av fistula placement 03/05/2011    Procedure: INSERTION OF ARTERIOVENOUS (AV) GORE-TEX GRAFT ARM;  Surgeon: Angelia Mould, MD;  Location: [place];  Service: Vascular;  Laterality: Left;  start 1115   finish 1250    History   Social History  . Marital Status: Divorced    Spouse Name: N/A    Number of Children: N/A  . Years of Education: N/A   Occupational History  . Not on file.   Social History Main Topics  . Smoking status: Never Smoker   . Smokeless tobacco: Not on file  .  Alcohol Use: No  . Drug Use: No  . Sexually Active: No   Other Topics Concern  . Not on file   Social History Narrative  . No narrative on file    Family History  Problem Relation Age of Onset  . Lung cancer Father   . Cancer Mother     kidney and thyroid    Allergies  Allergen Reactions  . Prozac (Fluoxetine Hcl) Hives    Prior to Admission medications   Medication Sig Start Date End Date Taking? Authorizing Provider  multivitamin (RENA-VIT) TABS tablet Take 1 tablet by mouth daily.     Yes Historical Provider, MD  paricalcitol (ZEMPLAR) 1 MCG capsule Take 1 mcg by mouth daily.     Yes Historical Provider, MD  zolpidem (AMBIEN) 10 MG tablet Take 10 mg by mouth at bedtime as needed. For sleep.   Yes Historical Provider, MD    PHYSICAL EXAMINATION:  Filed Vitals:   04/12/11 0545  BP: 137/90  Pulse: 89  Temp: 98.9 F (37.2 C)  Resp: 20    General: The patient appears their stated age.   Musculoskeletal: There are no major deformities.  There is no significant extremity pain. Neurologic: No focal weakness or paresthesias are detected,  Psychiatric: The patient has normal affect. Her left arm as opening in the axillary and antecubital wound. There is mild erythema at  the antecubital wound. There is a thrill in the Gore-Tex graft. There is no graft exposure..  Diagnostic Studies: None    Medication Changes: None  Assessment:  5 weeks status post left upper arm AV graft placement. Open incision at the antecubital and axillary wound. No graft exposure  Plan: Normal saline wet-to-dry local wound care to the wound. Antibiotics as written by renal service. We will continue to follow.  Elick Aguilera F 1/27/20138:54 AM

## 2011-04-12 NOTE — Progress Notes (Signed)
I have seen and examined this patient and agree with the assessment/plan as outlined above by [facility] PA. Appreciate input from vascular surgery in management planning. She is clinically better after PRBC transfusion yesterday and appears suitable for DC today with wound care instructions and oral antibiotics. Encouraged compliance with recommendations/visits with oncology. Winry Egnew K.,MD 04/12/2011 9:46 AM

## 2011-04-12 NOTE — Discharge Summary (Signed)
Physician Discharge Summary  Patient ID: Mary Wilcox MRN: [id number] DOB/AGE: 1956/03/15 56 y.o.  Admit date: 04/11/2011 Discharge date: 04/12/2011  Discharge Diagnoses:  1. Anemia 2. Fever  A. UTI             B. Left antecubital wound 3. ESRD 4. Secondary hyperparathyroidism 5. Protein malnutrition 6. Hypertension 7. Hx stage III B cervical cancer   Consults: vascular surgery - Dr. Donnetta Hutching Significant Diagnostic Studies: Radiology: CXR: NAD  [id number] Mary Wilcox is a 57 y.o. female with ESRD secondary to obstructive uropathy who started on dialysis in December 2012. Hgb at discharge from [facility] was 9.1 12/30 with tsat of 39%. She was discharged on high dose Epogen. Hgb has failed to significantly increase and then recently dropped significantly with monthly labs down to 6.9. BP is not low, but patient has fallen several times over the past week or so. On one instance she cut her head and presented to dialysis with dried blood on hair and face and looking disheveled by dialysis report. She refused ED evaluation. She states one fall was due to running to the BR on tip toes and the other was off the couch due to "tangled legs." She returned stool cards that were negative. However, her tsat for January had dropped to 26%. Ferritin in December was 1188 12/20. January 24th was 786. Of note was that Hgb also dropped to 6.8 in December 27th and was transfused three units during that time frame.  She was to have come directly to the hospital after dialysis today, but went home and took a nap and came late after she was called by the RN from her dialysis center.  In the ED she reports a chronic cough for about 10-12 days since her ureteral stents were removed and states the wound on her left arm graft has "been that way since it was put in". She denies fever, chills, dysuria or urgency, sob, DOE, CP, dizziness, ringing of the ears, N,V,D or constipation, numbness or tingling of extremities or  any pain. Appetite is improving. She does have itching.   Hospital Course: 1. Anemia - Pt initially to be admitted just for transfusion. Admission Hgb was 6.8 with an increase to 9.7 after 2 units PRBC.  I would expect that to equilibrate some.  She needs to continue on max Epogen at her outpatient dialysis center and complete her course of IV iron.  Hgb needs to be monitored closely as it is concerning that she had just received 3 units PRBC 12/27 with reported neg stool hemocults. 2. Fever/leukocytosis - Her fever and leukocytosis were found incidentally upon arrival.  They are most likely secondary to UTI or antecubital wound or both.  Urine and blood cultures are pending.  Wound culture is not in the computer.  She was treated empirically with Vancomycin  and Cipro. The later was chosen because she had a pseudomonas UTI in December that was sensitive to cipro and resistant to fortaz. CXR showed NAD. Dr. Donnetta Hutching evaluated arm wound and recommmended wet to dry dressing.  Graft material was not exposed. As she was afebrile, WBC had greatly improved and felt better, she was discharged to home with a prescription for Augmentin and Cipro and instructions on how to do BID wet to dry dressings.  The graft is over one month old.  I will ask her dialysis center to monitor this closely.  I am somewhat concerned about consistent compliance with medications and dressing changes. 3. Hypertension/volume -  not on BP meds. I think she needs more volume removed as BP was slightly high. Lower outpatient EDW to 74 kg.  4. ESRD - TTS; s/p HD today; check BMP tonight and in am after transfusion;   5. Metabolic bone disease - Apparently taking po zemplar and getting IV Zemplar: P controlled without binders. Plan to continue IV zemplar only. January iPTH 164. 6. Nutrition - high protein renal diet Alb 2.5 1/24; she received 30 cc prostat each outpatient dialysis treatment 7. Hx bilateral ureteral stents removed 1/14 Dr. Risa Grill;  [place] in December showed no hydro  8. Hx stage III B cervical cancer s/p ext radiation and intracavitary cessium 2006 - follow-up appt with Dr. Earlie Server in February; She has been a now show three times and this is her last opportunity or she will be dropped from the practice. - per dialysis notes     Diet: renal 1200 cc/day Discharge Medications:  Medication List  As of 04/12/2011 11:23 AM   STOP taking these medications         paricalcitol 1 MCG capsule         TAKE these medications         amoxicillin-clavulanate 500-125 MG per tablet   Commonly known as: AUGMENTIN   Take 1 tablet (500 mg total) by mouth 2 (two) times daily with a meal.x 10 days      ciprofloxacin 500 MG tablet   Commonly known as: CIPRO   Take 1 tablet (500 mg total) by mouth 1 day or 1 dose.x 10 days      multivitamin Tabs tablet   Take 1 tablet by mouth daily.      zolpidem 5 MG tablet   Commonly known as: AMBIEN   Take 1 tablet (5 mg total) by mouth at bedtime as needed for sleep (Insomnia).            Disposition: normal Discharge Orders    Future Appointments: Provider: Department: Dept Phone: Center:   04/27/2011 9:00 AM Earle Gell [facility] [phone] None   04/27/2011 9:30 AM Mohamed K. Julien Nordmann, MD [facility] [phone] None     Discharge Vital Signs and Labs: Temp:  [98.9 F (37.2 C)-99.8 F (37.7 C)] 99.3 F (37.4 C) (01/27 0943) Pulse Rate:  [81-106] 90  (01/27 0943) Resp:  [16-20] 16  (01/27 0943) BP: (132-144)/(69-90) 132/69 mmHg (01/27 0943) SpO2:  [93 %-99 %] 96 % (01/27 0943) Weight:  [74.617 kg (164 lb 8 oz)] 74.617 kg (164 lb 8 oz) (01/26 1900)  Basename 04/12/11 0504 04/11/11 1639  WBC 12.5* 18.7*  HGB 9.7* 6.8*  HCT 29.3* 21.8*  PLT 159 233   Renal:  Basename 04/12/11 0504 04/11/11 1639  NA 134* 137  K 3.6 3.7  CL 96 98  CO2 28 29  GLUCOSE 83 112*  BUN 12 8  CREATININE 3.36* 2.27*  CALCIUM 8.5 8.3*  PHOS -- --  ALBUMIN -- --   40 minutes were spent  completing this discharge summary, discharge med reconciliation, discharge patient instructions and communicating discharge information to the patient's dialysis center.  Hoot Owl, PA-C [facility] Beeper [phone] 04/12/2011, 11:23 AM  Attending Physician: Dr. Elmarie Shiley

## 2011-04-12 NOTE — Progress Notes (Signed)
[facility] Progress Note Subjective:  Feels better, "yesterday felt like she had to crawl"; slept well except for nursing checks during blood transfusion.  Objective Filed Vitals:   04/12/11 0126 04/12/11 0225 04/12/11 0316 04/12/11 0545  BP: 139/83 137/84 136/87 137/90  Pulse: 93 83 81 89  Temp: 98.9 F (37.2 C) 99.2 F (37.3 C) 99 F (37.2 C) 98.9 F (37.2 C)  TempSrc: Oral Oral Oral Oral  Resp: 18 18 18 20   Height:      Weight:      SpO2:    98%   Physical Exam: General: color better Heart: RRR Lungs: CTA bilaterally Abdomen: soft NTND Extremities: tr LE edema Dialysis Access: left upper AVGG patent, dressing intact  Problem/Plan: 1. Anemia -s/p transfusion 2 units PRBC tonight; Hbg to 9.7. 2. Fever/leukocytosis - WBC significantly improved. Afebrile. CXR neg; mostly likely secondary to UTI or graft related infx or both; emperic Vanc and cipro; cultures pending; arm was supposed to be cultured, do not see wound cultures in computer; Dr. Donnetta Hutching to evaluate left arm graft wound this am. 3. Hypertension/volume - not on BP meds. I think she needs more volume removed.  4. ESRD - TTS; K in 3s post transfusion 5. Metabolic bone disease - Apparently taking po zemplar and getting IV Zemplar: P controlled without binders; IV zemplar only  6. Nutrition - high protein renal diet Alb 2.5 1/24  7. Hx bilateral ureteral stents removed 1/14 Dr. Risa Grill; [place] in December showed no hydro  8. Hx stage III B cervical cancer s/p ext radiation and intracavitary cessium 2006 - follow-up appt with Dr. Earlie Server in February; Has been a now show 3 times and this is her last opportunity or she will be dropped from the practice. - per dialysis notes  9. Disp : pending [facility]VS eval - could possible go home today with outpt antibiotics.   Additional Objective Labs: Urine: WBC TNTC, many bacteria, 7-10 RBC and few epith, turbid; C & S pending Also,            BC x 2 pending  Basic Metabolic  Panel:  Lab [id number] 04/11/11 1639  NA 134* 137  K 3.6 3.7  CL 96 98  CO2 28 29  GLUCOSE 83 112*  BUN 12 8  CREATININE 3.36* 2.27*  CALCIUM 8.5 8.3*  ALB -- --  PHOS -- --   CBC:  Lab 04/12/11 0504 04/11/11 1639  WBC 12.5* 18.7*  NEUTROABS -- --  HGB 9.7* 6.8*  HCT 29.3* 21.8*  MCV 90.2 94.4  PLT 159 233   CStudies/Results: Dg Chest 2 View  04/11/2011  *RADIOLOGY REPORT*  Clinical Data: 56 year old female with low grade fever, cough, anemia.  CHEST - 2 VIEW  Comparison: 03/05/2011 and earlier.  Findings: Stable right chest tunneled IJ dual lumen dialysis type catheter.  Normal lung volumes.  The lungs are clear.  No pneumothorax or effusion.  Cardiac size and mediastinal contours are within normal limits.  No acute osseous abnormality identified.  IMPRESSION: No acute cardiopulmonary abnormality.  Original Report Authenticated By: Randall An, M.D.   Medications:      . acetaminophen  650 mg Oral Once  . ciprofloxacin  400 mg Intravenous Q24H  . diphenhydrAMINE  25 mg Oral Once  . multivitamin  1 tablet Oral Daily  . sodium chloride  3 mL Intravenous Q12H  . vancomycin  1,500 mg Intravenous To Major  . vancomycin  750 mg Intravenous Q T,Th,Sa-HD  .  zolpidem  5 mg Oral QHS  . DISCONTD: zolpidem  10 mg Oral QHS    I  have reviewed scheduled and prn medications.  Myriam Jacobson, PA-C [place]  04/12/2011,8:55 AM  LOS: 1 day

## 2011-04-12 NOTE — H&P (Signed)
Late Entry for patient encounter on 04/11/2011 I have seen and examined this patient and agree with the assessment/plan as outlined above by [facility] PA. Keagan Anthis K.,MD 04/12/2011 9:42 AM

## 2011-04-13 LAB — TYPE AND SCREEN
ABO/RH(D): AB POS
Antibody Screen: NEGATIVE
Unit division: 0
Unit division: 0

## 2011-04-13 LAB — CBC
HCT: 29.3 % — ABNORMAL LOW (ref 36.0–46.0)
Hemoglobin: 9.7 g/dL — ABNORMAL LOW (ref 12.0–15.0)
MCH: 29.8 pg (ref 26.0–34.0)
MCHC: 33.1 g/dL (ref 30.0–36.0)
MCV: 90.2 fL (ref 78.0–100.0)
Platelets: 159 10*3/uL (ref 150–400)
RBC: 3.25 MIL/uL — ABNORMAL LOW (ref 3.87–5.11)
RDW: 16.9 % — ABNORMAL HIGH (ref 11.5–15.5)
WBC: 12.5 10*3/uL — ABNORMAL HIGH (ref 4.0–10.5)

## 2011-04-14 LAB — URINE CULTURE
Colony Count: >=100000
Culture  Setup Time: 201301271102

## 2011-04-16 NOTE — Discharge Summary (Signed)
I have seen and examined this patient and agree with the assessment/plan as outlined above by [facility] PA. Aydn Ferrara K.,MD 04/16/2011 10:37 AM

## 2011-04-17 LAB — CULTURE, BLOOD (ROUTINE X 2)
Culture  Setup Time: 201301262057
Culture  Setup Time: 201301262057
Culture: NO GROWTH
Culture: NO GROWTH

## 2011-04-27 ENCOUNTER — Ambulatory Visit: Payer: BC Managed Care – PPO | Admitting: Internal Medicine

## 2011-04-27 ENCOUNTER — Other Ambulatory Visit (HOSPITAL_BASED_OUTPATIENT_CLINIC_OR_DEPARTMENT_OTHER): Payer: BC Managed Care – PPO | Admitting: Lab

## 2011-04-27 VITALS — BP 142/74 | HR 89 | Temp 97.6°F | Ht 61.0 in | Wt 168.7 lb

## 2011-04-27 DIAGNOSIS — C539 Malignant neoplasm of cervix uteri, unspecified: Secondary | ICD-10-CM

## 2011-04-27 DIAGNOSIS — D473 Essential (hemorrhagic) thrombocythemia: Secondary | ICD-10-CM

## 2011-04-27 LAB — CBC WITH DIFFERENTIAL/PLATELET
BASO%: 0.2 % (ref 0.0–2.0)
Basophils Absolute: 0 10*3/uL (ref 0.0–0.1)
EOS%: 2.3 % (ref 0.0–7.0)
Eosinophils Absolute: 0.3 10*3/uL (ref 0.0–0.5)
HCT: 34.6 % — ABNORMAL LOW (ref 34.8–46.6)
HGB: 11.1 g/dL — ABNORMAL LOW (ref 11.6–15.9)
LYMPH%: 12.7 % — ABNORMAL LOW (ref 14.0–49.7)
MCH: 30.6 pg (ref 25.1–34.0)
MCHC: 32 g/dL (ref 31.5–36.0)
MCV: 95.7 fL (ref 79.5–101.0)
MONO#: 0.5 10*3/uL (ref 0.1–0.9)
MONO%: 4.7 % (ref 0.0–14.0)
NEUT#: 8.7 10*3/uL — ABNORMAL HIGH (ref 1.5–6.5)
NEUT%: 80.1 % — ABNORMAL HIGH (ref 38.4–76.8)
Platelets: 223 10*3/uL (ref 145–400)
RBC: 3.62 10*6/uL — ABNORMAL LOW (ref 3.70–5.45)
RDW: 23.4 % — ABNORMAL HIGH (ref 11.2–14.5)
WBC: 10.8 10*3/uL — ABNORMAL HIGH (ref 3.9–10.3)
lymph#: 1.4 10*3/uL (ref 0.9–3.3)

## 2011-04-27 LAB — COMPREHENSIVE METABOLIC PANEL
ALT: 10 U/L (ref 0–35)
AST: 14 U/L (ref 0–37)
Albumin: 3.1 g/dL — ABNORMAL LOW (ref 3.5–5.2)
Alkaline Phosphatase: 89 U/L (ref 39–117)
BUN: 32 mg/dL — ABNORMAL HIGH (ref 6–23)
CO2: 24 mEq/L (ref 19–32)
Calcium: 9.7 mg/dL (ref 8.4–10.5)
Chloride: 97 mEq/L (ref 96–112)
Creatinine, Ser: 6 mg/dL — ABNORMAL HIGH (ref 0.50–1.10)
Glucose, Bld: 102 mg/dL — ABNORMAL HIGH (ref 70–99)
Potassium: 4.1 mEq/L (ref 3.5–5.3)
Sodium: 138 mEq/L (ref 135–145)
Total Bilirubin: 0.3 mg/dL (ref 0.3–1.2)
Total Protein: 6.3 g/dL (ref 6.0–8.3)

## 2011-04-28 NOTE — Progress Notes (Signed)
Patient was discharged from the clinic several years ago. She made it to my schedule by mistake. She will need to reschedule with another physician as a new patient. Mr. Deborah Chalk is aware of this situation and he spoke to the patient before she leaves the clinic.

## 2011-05-01 ENCOUNTER — Telehealth: Payer: Self-pay | Admitting: *Deleted

## 2011-05-01 NOTE — Telephone Encounter (Signed)
Mary Wilcox presented to [facility] for appt for a 15" EST PT follow up acquired for her by [employer]. However, per letter dictated 02/17/2007, patient had been released from practice from previous physician. Dollene Primrose, Office manager went in to discuss with patient that she would not be seen today.  Given the fact patient has attempted to re-establish, we will accept her as a new patient due to the duration of her absence and Dr Marko Plume will be re-establishing her as a new patient. Since her last visit, she has become reliant on dialysis treatments and is attempting to get on the Kidney transplant list and needs to be seen by an Oncologist. Patient request Wednesdays or Fridays due to current dialysis schedule.

## 2011-05-05 ENCOUNTER — Encounter: Payer: Self-pay | Admitting: Vascular Surgery

## 2011-05-05 ENCOUNTER — Telehealth: Payer: Self-pay | Admitting: Oncology

## 2011-05-05 NOTE — Telephone Encounter (Signed)
Called pt , left message x 2, still waiting for a call back.

## 2011-05-06 ENCOUNTER — Ambulatory Visit: Payer: BC Managed Care – PPO | Admitting: Vascular Surgery

## 2011-05-06 ENCOUNTER — Telehealth: Payer: Self-pay | Admitting: Oncology

## 2011-05-06 NOTE — Telephone Encounter (Signed)
Pt left message, called pt, back and left another message asking pt to call us back to confirm appt

## 2011-05-07 ENCOUNTER — Telehealth: Payer: Self-pay | Admitting: Oncology

## 2011-05-07 NOTE — Telephone Encounter (Signed)
Talked to pt, gave her appt date in April 2013, pt aware of location, she is a former pt here. She informed me that she will be coming to her appt , pt seeing Dr. Marko Plume.

## 2011-05-08 ENCOUNTER — Telehealth: Payer: Self-pay | Admitting: Oncology

## 2011-05-08 NOTE — Telephone Encounter (Signed)
Transfer from Dr. Tyrell Antonio Dx- Hx of cervical ca

## 2011-05-13 ENCOUNTER — Ambulatory Visit: Payer: BC Managed Care – PPO

## 2011-05-15 ENCOUNTER — Encounter: Payer: Self-pay | Admitting: Thoracic Diseases

## 2011-05-15 ENCOUNTER — Ambulatory Visit (INDEPENDENT_AMBULATORY_CARE_PROVIDER_SITE_OTHER): Payer: BC Managed Care – PPO | Admitting: Thoracic Diseases

## 2011-05-15 VITALS — HR 72 | Resp 18

## 2011-05-15 DIAGNOSIS — N186 End stage renal disease: Secondary | ICD-10-CM

## 2011-05-15 NOTE — Progress Notes (Signed)
VASCULAR & VEIN SPECIALISTS OF   Postoperative Visit hemodialysis access   Date of Surgery: 03/05/11 LUE Loop AVGG/insertion of dialysis cath. Surgeon: CSD Nephrologist: Dr. Justin Mend, HD T,TH,SAT  HPI: Mary Wilcox is a 56 y.o. female who is 9 weeks S/P creation/revision of left upper extremity Hemodialysis access. The patient complains of symptoms of numbness in the 4th and 5th fingers, but she can use the hand normally. She denies pain in the operative limb. Patient is here for post -op evaluation to assess healing and maturation of left UE Loop AVGG Pt states they have not used the graft yet because she had infection in it and she has completed a course of antibiotics .  Pt is on hemodialysis through  right IJ cath.  Physical Examination  Filed Vitals:   05/15/11 0955  Pulse: 72  Resp: 18  Sats 98%  WDWN female in NAD.  left upper extremity Incision is healed There was no fluctuance around graft. It well incorporated Skin color is normal   Hand grip is 5/5 and sensation in digits is intact except decreased in 4th and 5th fingers of the left hand; There is a good thrill and good bruit in the [place]. The graft/fistula is easily palpable and of adequate size  Assessment/Plan Mary Wilcox is a 56 y.o. year old who is s/p creation/revision of left upper extremity Hemodialysis access. Follow-up in 9 weeks The patient's access is ready is use now.  Clinic MD: [facility]

## 2011-05-22 ENCOUNTER — Other Ambulatory Visit (HOSPITAL_COMMUNITY): Payer: Self-pay | Admitting: Nephrology

## 2011-05-22 DIAGNOSIS — N186 End stage renal disease: Secondary | ICD-10-CM

## 2011-05-25 ENCOUNTER — Ambulatory Visit (HOSPITAL_COMMUNITY): Admission: RE | Admit: 2011-05-25 | Payer: BC Managed Care – PPO | Source: Ambulatory Visit

## 2011-05-27 ENCOUNTER — Ambulatory Visit (HOSPITAL_COMMUNITY): Payer: BC Managed Care – PPO

## 2011-05-27 ENCOUNTER — Ambulatory Visit (HOSPITAL_COMMUNITY)
Admission: RE | Admit: 2011-05-27 | Discharge: 2011-05-27 | Disposition: A | Discharge status: Home / Self Care | Payer: BC Managed Care – PPO | Source: Ambulatory Visit | Attending: Nephrology | Admitting: Nephrology

## 2011-05-27 ENCOUNTER — Other Ambulatory Visit (HOSPITAL_COMMUNITY): Payer: Self-pay | Admitting: Nephrology

## 2011-05-27 DIAGNOSIS — N186 End stage renal disease: Secondary | ICD-10-CM

## 2011-05-27 DIAGNOSIS — Z452 Encounter for adjustment and management of vascular access device: Secondary | ICD-10-CM | POA: Insufficient documentation

## 2011-05-27 IMAGING — XA IR REMOVAL TUNNELED CV CATH
1 series · 1 of 1 positions shown · non-contrast
Comparison: none

CLINICAL DATA: Functioning upper arm AV graft, catheter access no
longer required

[Series 1: care single · 1 of 1 slices shown]
[im 1/1]
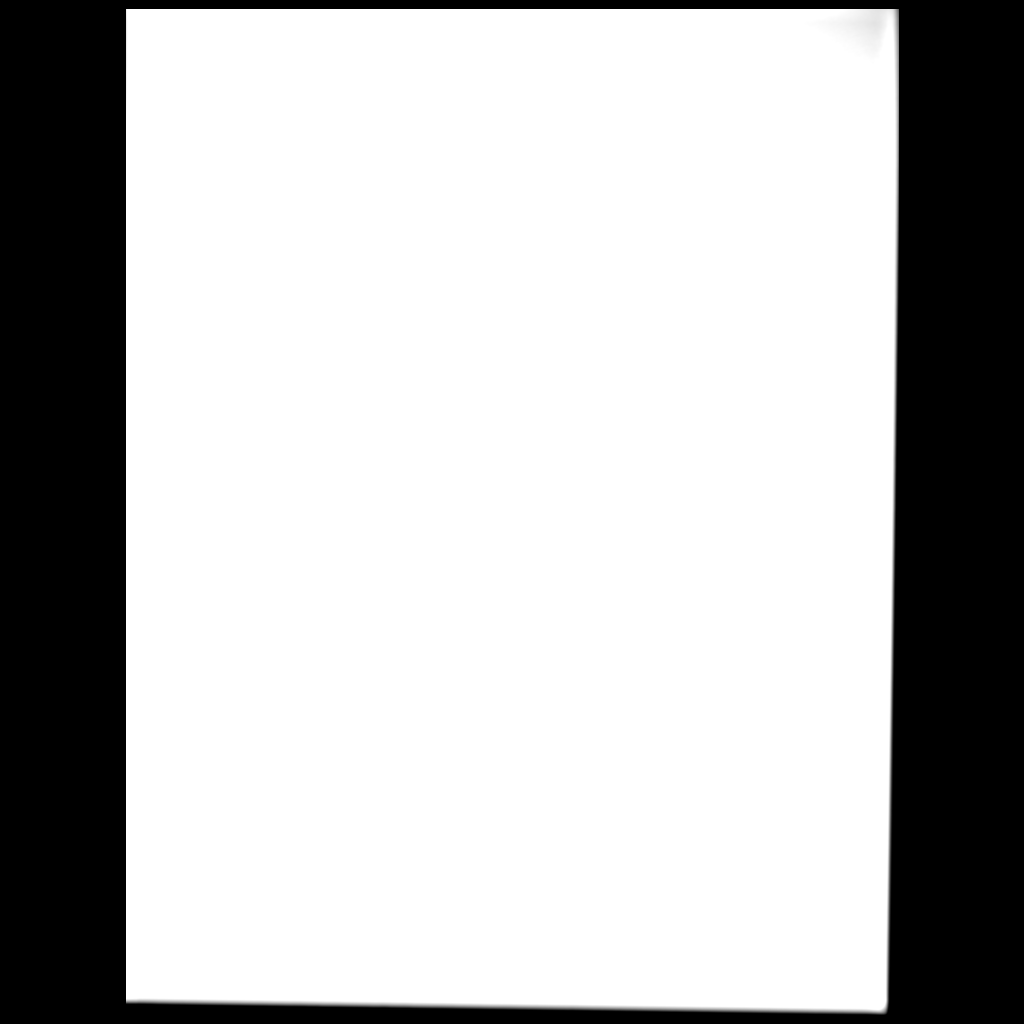

[1 of 1 positions shown; findings below may reference images not displayed]

RIGHT INTERNAL JUGULARTUNNELED HEMODIAYLISIS VANZARA REMOVAL

Date:  [DATE] [DATE]

Radiologist:  VANZARA, M.D.

Medications:  1% lidocaine with epinephrine locally

Complications:  No immediate

PROCEDURE/FINDINGS:

Informed consent was obtained from the patient following
explanation of the procedure, risks, benefits and alternatives.
The patient understands, agrees and consents for the procedure.
All questions were addressed.  A time out was performed.

Maximal barrier sterile technique utilized including caps, mask,
sterile gowns, sterile gloves, large sterile drape, hand hygiene,
and poor.

1% lidocaine was injected under sterile conditions along the
subcutaneous tunnel.  The read right internal jugulartunneled
catheter was removed utilizing blunt and sharp dissection to
release the retention cuff.  The catheter was removed entirely.
Hemostasis was obtained with compression.  No immediate
complication.  Sterile dressing applied.  The patient tolerated the
procedure well.
IMPRESSION: Successful right internal jugulartunnel dialysis catheter removal

## 2011-05-27 MED ORDER — SODIUM BICARBONATE 4 % IV SOLN
INTRAVENOUS | Status: AC
  Filled 2011-05-27: qty 5

## 2011-05-27 NOTE — Procedures (Signed)
Successful RT IJ HD cath removal No comp

## 2011-06-17 ENCOUNTER — Other Ambulatory Visit: Payer: BC Managed Care – PPO | Admitting: Lab

## 2011-06-17 ENCOUNTER — Ambulatory Visit: Payer: BC Managed Care – PPO

## 2011-06-17 ENCOUNTER — Encounter: Payer: BC Managed Care – PPO | Admitting: Oncology

## 2011-06-17 ENCOUNTER — Encounter: Payer: Self-pay | Admitting: Oncology

## 2011-06-17 NOTE — Progress Notes (Signed)
Discharged from [facility] in 2008 due to multiple no-shows with Dr Julien Nordmann. Set up as new patient to Dr Marko Plume today on referral from nephrology due to her past history of cancer, but not with any new or active cancer known per my review of referral information. Patient was no show for new patient visit today. Administration in agreement with physicians that we will not try to reschedule today's new patient appointment.

## 2011-06-22 ENCOUNTER — Other Ambulatory Visit: Payer: Self-pay | Admitting: Vascular Surgery

## 2011-07-16 ENCOUNTER — Ambulatory Visit (HOSPITAL_COMMUNITY)
Admission: RE | Admit: 2011-07-16 | Discharge: 2011-07-16 | Disposition: A | Discharge status: Home / Self Care | Payer: BC Managed Care – PPO | Source: Ambulatory Visit | Attending: Nephrology | Admitting: Nephrology

## 2011-07-16 ENCOUNTER — Other Ambulatory Visit (HOSPITAL_COMMUNITY): Payer: Self-pay | Admitting: Nephrology

## 2011-07-16 VITALS — BP 142/70 | HR 59 | Temp 98.2°F | Resp 18

## 2011-07-16 DIAGNOSIS — T82898A Other specified complication of vascular prosthetic devices, implants and grafts, initial encounter: Secondary | ICD-10-CM | POA: Insufficient documentation

## 2011-07-16 DIAGNOSIS — Z992 Dependence on renal dialysis: Secondary | ICD-10-CM | POA: Insufficient documentation

## 2011-07-16 DIAGNOSIS — N2581 Secondary hyperparathyroidism of renal origin: Secondary | ICD-10-CM | POA: Insufficient documentation

## 2011-07-16 DIAGNOSIS — I120 Hypertensive chronic kidney disease with stage 5 chronic kidney disease or end stage renal disease: Secondary | ICD-10-CM | POA: Insufficient documentation

## 2011-07-16 DIAGNOSIS — N186 End stage renal disease: Secondary | ICD-10-CM

## 2011-07-16 DIAGNOSIS — Y832 Surgical operation with anastomosis, bypass or graft as the cause of abnormal reaction of the patient, or of later complication, without mention of misadventure at the time of the procedure: Secondary | ICD-10-CM | POA: Insufficient documentation

## 2011-07-16 DIAGNOSIS — D509 Iron deficiency anemia, unspecified: Secondary | ICD-10-CM | POA: Insufficient documentation

## 2011-07-16 DIAGNOSIS — Z8541 Personal history of malignant neoplasm of cervix uteri: Secondary | ICD-10-CM | POA: Insufficient documentation

## 2011-07-16 LAB — POTASSIUM: Potassium: 5.5 mEq/L — ABNORMAL HIGH (ref 3.5–5.1)

## 2011-07-16 IMAGING — XA IR AV DIALYSIS GRAFT DECLOT
1 of 2 series · 12 of 24 positions shown · non-contrast
Comparison: none

CLINICAL DATA: End-stage renal disease, occluded left upper arm AV
graft

[Series 1: run · 12 of 49 slices shown]
[im 1/49]
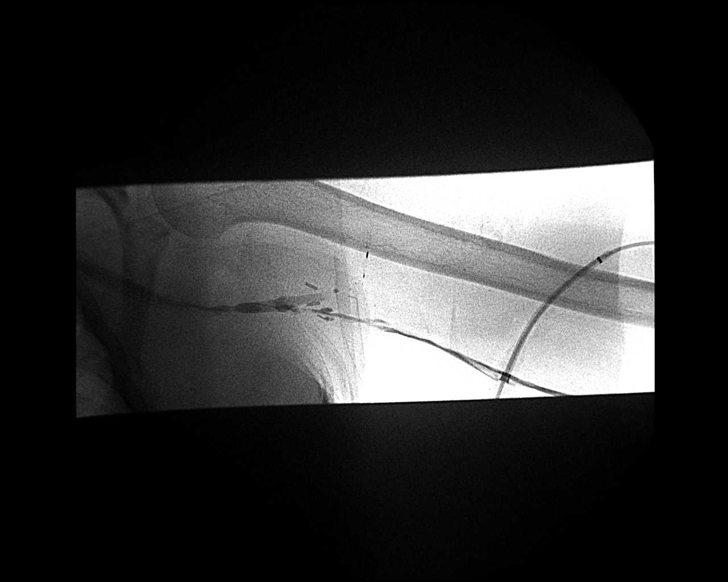
[im 5/49]
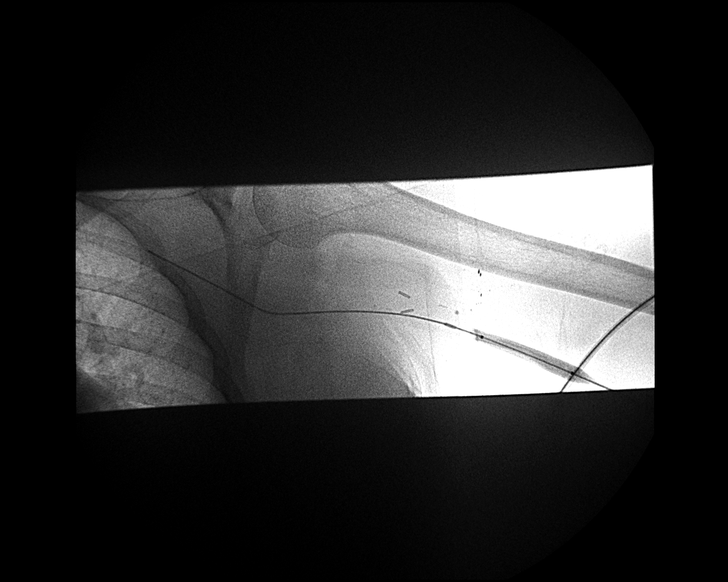
[im 9/49]
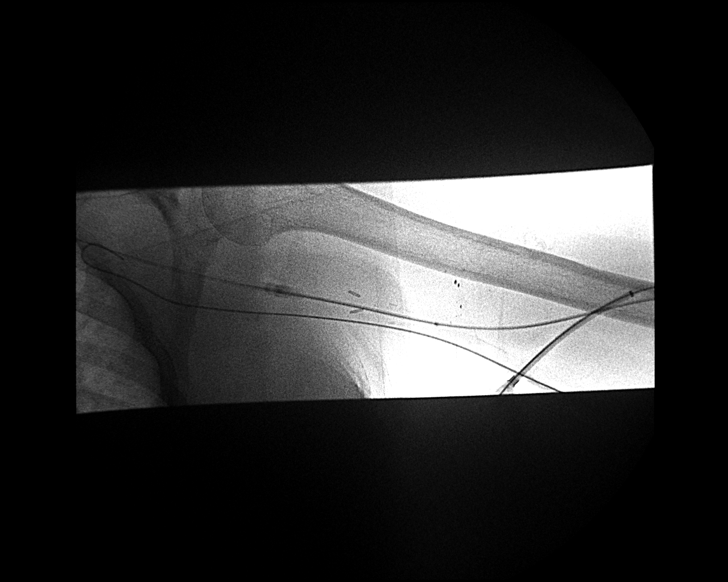
[im 14/49]
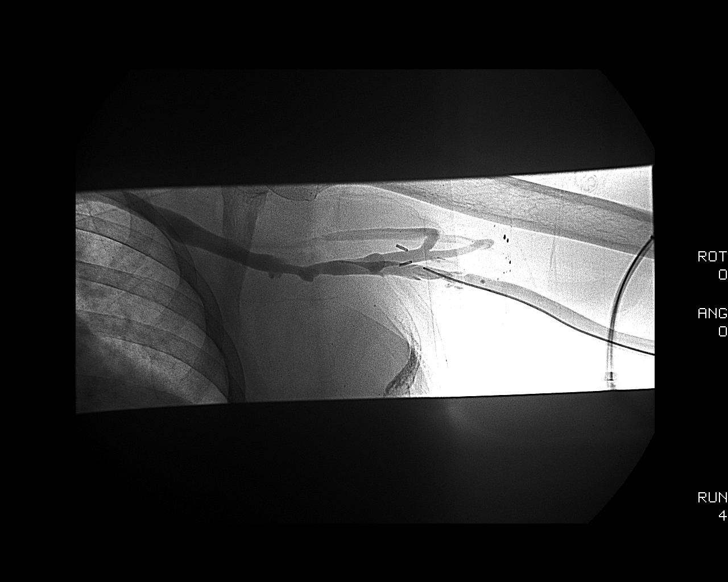
[im 18/49]
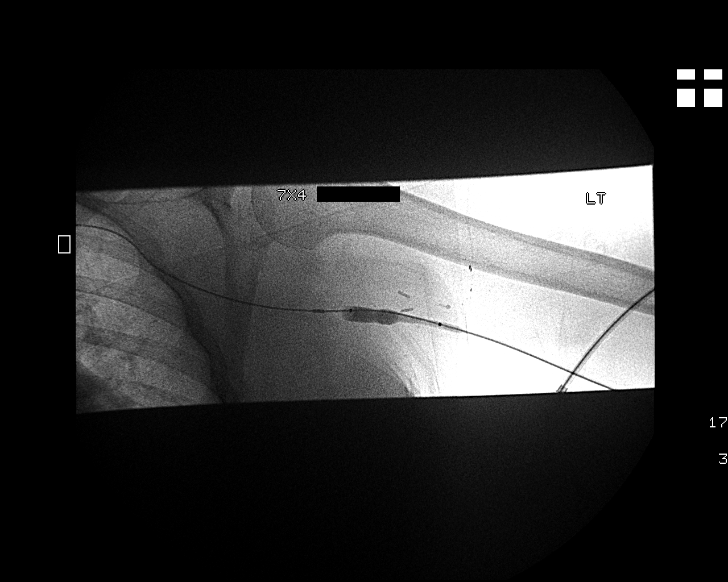
[im 22/49]
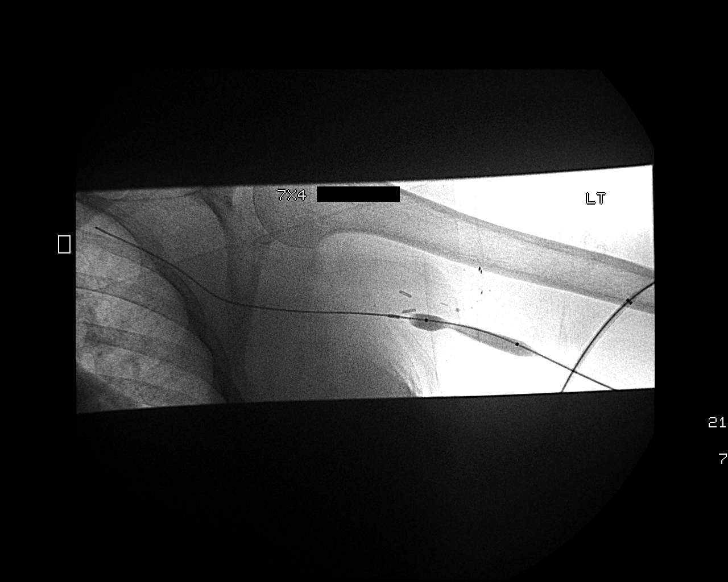
[im 27/49]
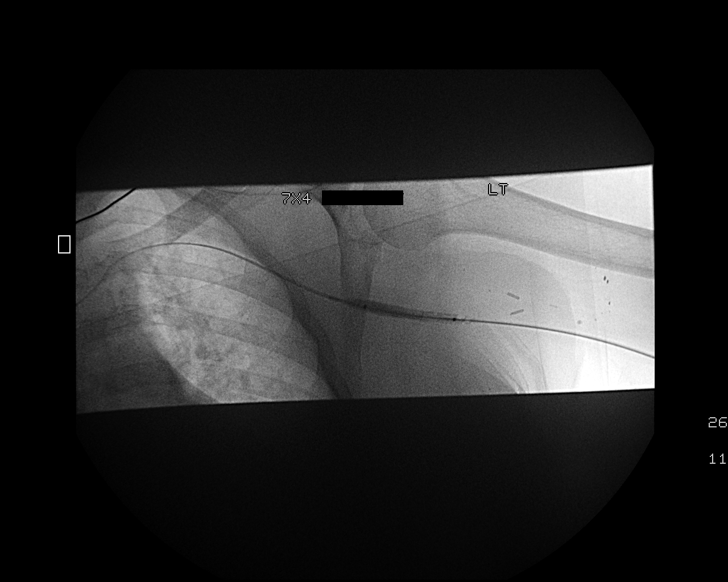
[im 31/49]
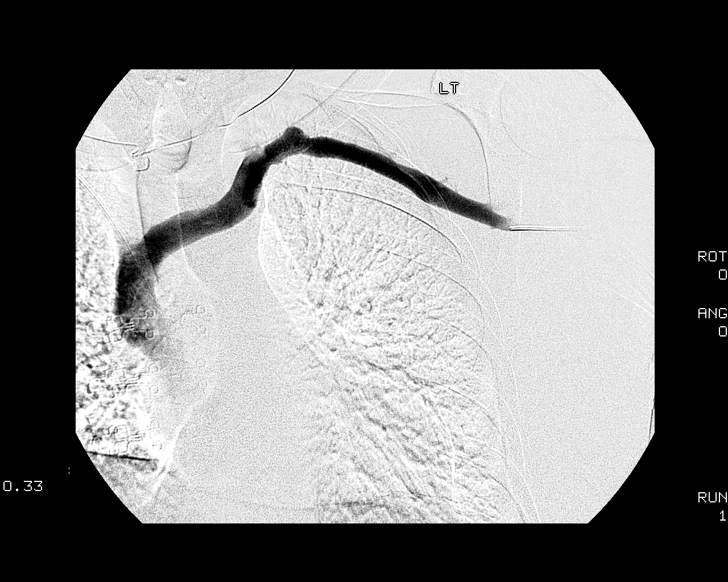
[im 35/49]
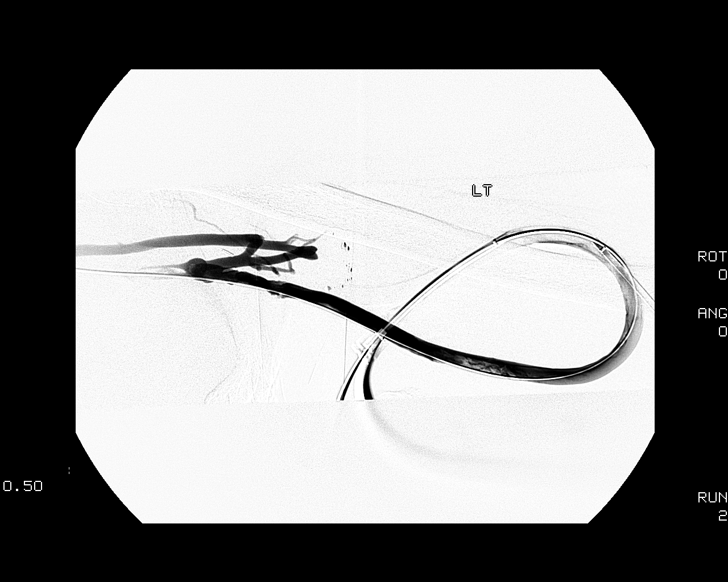
[im 40/49]
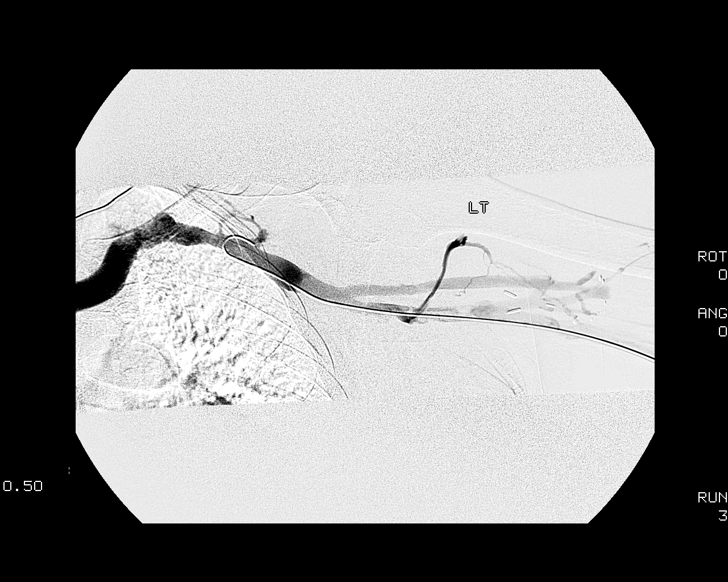
[im 44/49]
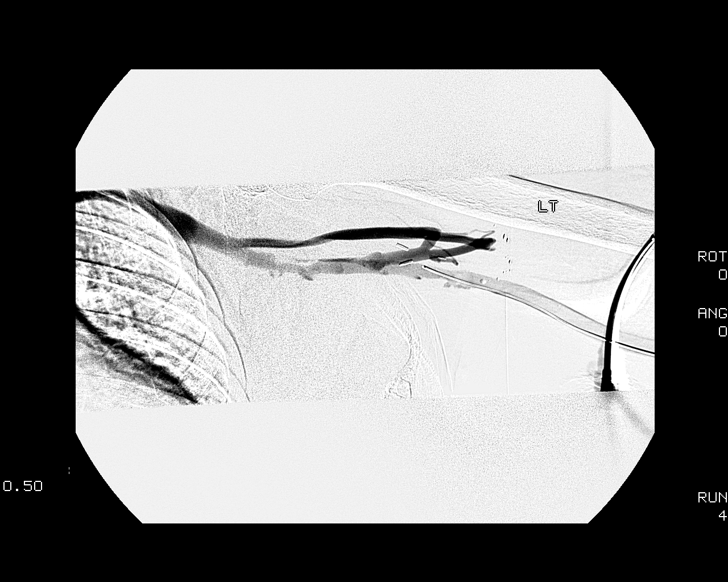
[im 49/49]
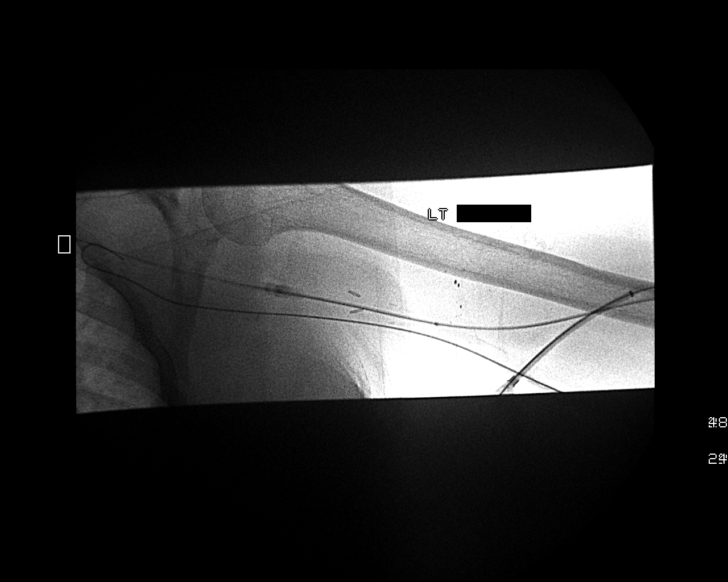

[12 of 24 positions shown; findings below may reference images not displayed]

ULTRASOUND GUIDANCE FOR VASCULAR ACCESS
LEFT UPPER ARM AV GRAFT THROMBOLYSIS/THROMBECTOMY
7 MM VENOUS ANGIOPLASTY

Date:  [DATE] [DATE]

Radiologist:  TONIO, M.D.

Medications:  4 mg Versed, 100 mcg Fentanyl, 2 mg TPA, [PG] units
heparin

Guidance:  Ultrasound and fluoroscopic

Fluoroscopy time:  5.6 minutes

Sedation time:  45 minutes

Contrast volume:  50 ml [PG]

Complications:  No immediate

PROCEDURE/FINDINGS:

Informed consent was obtained from the patient following
explanation of the procedure, risks, benefits and alternatives.
The patient understands, agrees and consents for the procedure.
All questions were addressed.  A time out was performed.

Maximal barrier sterile technique utilized including caps, mask,
sterile gowns, sterile gloves, large sterile drape, hand hygiene,
and betadine

Under sterile conditions and local anesthesia, the arterial side of
the graft was accessed at two separate sites with ultrasound.
Images obtained for documentation.  2 6-French sheaths inserted.
Contrast injection confirms thrombosis of the graft.  Catheter
guide wire advanced across the venous anastomosis.  Outflow
venogram confirms patency of the central veins.  No central
stenosis or occlusion.  Pullback venogram confirms thrombotic
occlusion of the high brachial vein.  2 mg TPA instilled for
thrombolysis.  Mechanical thrombectomy performed with the Teratola
device throughout the entire loop graft.  Overlapping 7 mm
angioplasty performed of the venous anastomosis and throughout the
graft.  Graft inflow reestablished by passing a 5fr Fogarty
catheter over a glide wire across the arterial anastomosis twice.
Sheaths were back bled and syringe aspirated.  Shuntogram
performed.

Shuntogram:  Following thrombolysis, thrombectomy and venous
angioplasty the left upper arm AV graft is now patent.  There is
brisk venous outflow.  No residual venous outflow stenosis.  Graft
is widely patent.

Sheaths removed.  Hemostasis obtained with pursestring sutures.  No
immediate complication.  The patient tolerated the procedure well.
IMPRESSION: Successful left upper arm AV graft thrombolysis, thrombectomy, and
venous angioplasty to restore flow.  Access ready for use.

Access management:  This access remains amenable to intervention

## 2011-07-16 MED ORDER — FENTANYL CITRATE 0.05 MG/ML IJ SOLN
INTRAMUSCULAR | Status: AC | PRN
  Administered 2011-07-16 (×2): 50 ug via INTRAVENOUS

## 2011-07-16 MED ORDER — SODIUM POLYSTYRENE SULFONATE 15 GM/60ML PO SUSP
20.0000 g | ORAL | Status: DC
  Filled 2011-07-16: qty 120

## 2011-07-16 MED ORDER — MIDAZOLAM HCL 5 MG/5ML IJ SOLN
INTRAMUSCULAR | Status: AC | PRN
  Administered 2011-07-16 (×2): 2 mg via INTRAVENOUS

## 2011-07-16 MED ORDER — MIDAZOLAM HCL 2 MG/2ML IJ SOLN
INTRAMUSCULAR | Status: AC
  Filled 2011-07-16: qty 6

## 2011-07-16 MED ORDER — FENTANYL CITRATE 0.05 MG/ML IJ SOLN
INTRAMUSCULAR | Status: AC
  Filled 2011-07-16: qty 4

## 2011-07-16 MED ORDER — HEPARIN SODIUM (PORCINE) 1000 UNIT/ML IJ SOLN
INTRAMUSCULAR | Status: AC
  Administered 2011-07-16: 3000 [IU]
  Filled 2011-07-16: qty 1

## 2011-07-16 MED ORDER — ALTEPLASE 2 MG IJ SOLR
2.0000 mg | Freq: Once | INTRAMUSCULAR | Status: AC
  Administered 2011-07-16: 2 mg
  Filled 2011-07-16: qty 2

## 2011-07-16 MED ORDER — IOHEXOL 300 MG/ML  SOLN
100.0000 mL | Freq: Once | INTRAMUSCULAR | Status: AC | PRN
  Administered 2011-07-16: 60 mL via INTRAVENOUS

## 2011-07-16 NOTE — Procedures (Signed)
Successful LUE AVG declot with 69mm pta No comp Ready to use Full report in pacs

## 2011-07-16 NOTE — ED Notes (Signed)
Discharge instructions have been reviewed with the patient and her mother.  Pt escorted to passenger side of mother's vehicle.

## 2011-07-16 NOTE — ED Notes (Signed)
O2 d/c'd 

## 2011-07-16 NOTE — ED Notes (Signed)
O2 2L [place] 

## 2011-07-16 NOTE — H&P (Signed)
Mary Wilcox is an 56 y.o. female.   Chief Complaint: clotted HD access - created 02/2011.   HPI: Access noted to be clotted upon HD arrival today.  PC removed 05/27/11.   No prior interventions on this access.   Past Medical History  Diagnosis Date  . Chronic kidney disease     hx several episodes of ARF secondary to obstructive uropathy  . Cervical cancer 2006    IIIB s/p ext radiation and intracavitary cessium  . Secondary hyperparathyroidism (of renal origin)   . Iron deficiency anemia   . Hypertension   . Bilateral hydronephrosis 2006    due to obstruction from cervical cancer  . Transfusion history     02/2011 for Hgb 6.8    Past Surgical History  Procedure Date  . Ureter surgery     stent, Dr. Risa Grill for bilateral hydro  . Tubal ligation   . Insertion of dialysis catheter 03/05/2011    Procedure: INSERTION OF DIALYSIS CATHETER;  Surgeon: Angelia Mould, MD;  Location: [facility];  Service: Vascular;  Laterality: Right;  start 1041- finish 1051  . Av fistula placement 03/05/2011    Procedure: INSERTION OF ARTERIOVENOUS (AV) GORE-TEX GRAFT ARM;  Surgeon: Angelia Mould, MD;  Location: [facility];  Service: Vascular;  Laterality: Left;  start 1115   finish 1250    Family History  Problem Relation Age of Onset  . Lung cancer Father   . Cancer Father   . Cancer Mother     kidney and thyroid  . Heart disease Brother    Social History:  reports that she has never smoked. She does not have any smokeless tobacco history on file. She reports that she does not drink alcohol or use illicit drugs.  Allergies:  Allergies  Allergen Reactions  . Prozac (Fluoxetine Hcl) Hives    Review of Systems  Constitutional: Negative for fever and chills.  Respiratory: Negative.   Cardiovascular: Negative.   Gastrointestinal: Negative.   Skin: Negative.   Neurological: Negative.   Psychiatric/Behavioral: Negative.     Physical Exam  Constitutional: She is oriented to person,  place, and time. She appears well-developed and well-nourished. No distress.  HENT:  Head: Normocephalic and atraumatic.  Cardiovascular: Normal rate, regular rhythm and normal heart sounds.  Exam reveals no gallop and no friction rub.   No murmur heard. Respiratory: Effort normal and breath sounds normal. No respiratory distress. She has no wheezes. She has no rales.  GI: Soft.  Musculoskeletal: Normal range of motion. She exhibits no edema.       LUE AVG without thrill. Palpable arterial pulse.  No evidence of hematoma, erythema or drainage. Nontender   Neurological: She is alert and oriented to person, place, and time.  Skin: Skin is warm and dry. She is not diaphoretic.  Psychiatric: She has a normal mood and affect. Her behavior is normal. Judgment and thought content normal.     Assessment/Plan Patient with new AVG just created 02/2011.  Presented to HD today with clotted access.  Declot procedure details and potential complications including but not limited to infection, bleeding, vessel damage, unsuccessful declot and possible complications with moderate sedation discussed with the patient's apparent understanding.  She is aware an HD catheter would be placed if unsuccessful with declot today. Written consent obtained.  Plan for declot with moderate sedation.   Vayden Weinand D 07/16/2011, 11:46 AM

## 2011-07-16 NOTE — ED Notes (Signed)
Potasium results called to C. Tyrone Schimke, [place].  New order for for kayexlate 30 G PO

## 2011-07-16 NOTE — Discharge Instructions (Signed)
Arteriovenous (AV) Access for Hemodialysis An arteriovenous (AV) access is a surgically created or placed tube that allows for repeated access to the blood in your body. This access is required for hemodialysis, a type of dialysis. Dialysis is a treatment process that filters and cleans the blood in order to eliminate toxic wastes from the body when the kidneys fail to do this on their own. There are several different access methods used for hemodialysis. ACCESS METHODS  Double lumen catheter. A flexible tube with 2 channels may be used on a temporary or long-term basis. A long-term use catheter often has a cuff that holds it in place. The catheter is surgically placed and tunneled under the skin. This catheter is often placed when dialysis is needed in an emergency, such as when the kidneys suddenly stop working. It may also be needed when a permanent AV access fails or has not yet been placed. A catheter is usually placed into one of the following:   Large vein under your collarbone (subclavian vein).   Large vein in your neck (jugular vein).   Temporarily, in the large vein in your groin (femoral vein).   AV graft. A man-made (synthetic) material may be used to connect an artery and vein in the arm or thigh. This graft takes around 2 weeks to develop (mature) and is often placed a few weeks prior to use. A graft can generally last from 1 to 2 years.   AV fistula. A minor surgical procedure may be done to connect an artery and vein, creating a fistula. This causes arterial blood to flow directly into a vein. The vein gets larger, allowing easier access for dialysis. The fistula takes around 12 weeks to mature and must be placed several months before dialysis is anticipated. A fistula provides the best access for hemodialysis and can last for several years.  It is critical that veins in patients at high risk to develop kidney failure are preserved. This may include avoiding blood pressure checks and  intravenous (IV) or lab draws from the arm. This maximizes the chance for creating a functioning AV access when needed. Although a fistula is the most desirable access to use for hemodialysis, it may not be possible. If the veins are not large enough or there is no time to wait for a fistula to mature, a graft or catheter may be used. RISKS AND COMPLICATIONS   Double lumen catheter. A catheter may develop serious infections. It may also develop a clot (thrombosis) and fail. A catheter can cause clots in the vein in which it is placed. A subclavian vein catheter is the most likely to cause thrombosis. When the subclavian vein clots, it makes it very difficult for the patient to sustain an AV graft or fistula. When possible, catheters should be avoided and a more permanent AV access should be placed.   AV graft. A graft may swell after surgery, but this should decrease as it heals. A graft may stop working properly due to thrombosis or the diameter of the tube getting smaller. The tube can eventually become blocked (stenosis). If a partial blockage is found relatively early, it can be treated. Left untreated, the stenosis will progress until the vessel is completely blocked. Infection may also occur.   AV fistula. Infection and thrombosis are the biggest risks with a fistula. However, the rates for infection and stenosis are lower with fistulas than the other 2 methods. Frequent thrombosis may require creating a backup fistula at another site.   This will allow for dialysis when one access is blocked.  HOME CARE INSTRUCTIONS   Keep the site of the cut (incision) clean and dry while it heals. This helps prevent infection.   If you have a catheter, do not shower while the incision is healing. After it is healed, ask your caregiver for recommendations on showering. The bandage (dressing) will be changed at the dialysis center. Do not remove this dressing. However, if it becomes wet or loose, a sterile gauze  dressing may be placed over the site and secured by placing adhesive tape on the edges.   If you have a graft or fistula, clean the site daily until it heals completely. Stitches will be removed, usually after about 10 to 14 days. Once the access is being used for dialysis, a small dressing will be placed over the needle sites after the treatment is done. Keep this dressing on for at least 12 hours. Keep your arm or thigh clean and dry during this time.   A small amount of bleeding is normal, especially if the access is new. If you have a large amount of bleeding and cannot stop it, this is not normal. Call your caregiver right away. You will be taught how to hold your graft or fistula sites to stop the bleeding.  If you have a graft or fistula:  A "bruit" is a noise that is heard with a stethoscope and a "thrill" is a vibration felt over the graft or fistula. The presence of the bruit and thrill indicate that the access is working. You will be taught to feel for the thrill each day. If this is not felt, the access may be clotted. Call your caregiver.   You may use the arm freely after the site heals. Keep the following in mind:   Avoid pressure on the arm.   Avoid lifting heavy objects with the arm.   Avoid sleeping on the arm with the graft or fistula.   Avoid wearing tight-sleeved shirts or jewelry around the graft or fistula.   Do not allow blood pressure monitoring or needle punctures on the side where the graft or fistula is located.   With permission from your caregiver, you may do exercises to help with blood flow through a fistula. These exercises involve squeezing a rubber ball or other soft objects as instructed.  SEEK MEDICAL CARE IF:   Chills develop.   You have an oral temperature above 102 F (38.9 C).   Swelling around the graft or fistula gets worse.   New pain develops.   Unusual bleeding develops.   Pus or other fluid (drainage) is seen at the AV access site.    Skin redness or red streaking is seen on the skin around, above, or below the AV access.  SEEK IMMEDIATE MEDICAL CARE IF:   Pain, numbness, or an unusual pale skin color develops in the hand on the side of your fistula.   Dizziness or weakness develops that you have not had before.   Shortness of breath develops.   Chest pain develops.   The AV access has bleeding that cannot be easily controlled.  Wear a medical alert bracelet to let caregivers know you are a dialysis patient, so they can care for your veins appropriately. Document Released: 05/23/2002 Document Revised: 02/19/2011 Document Reviewed: 07/30/2009 [facility] Patient Information 2012 [place].  Moderate Sedation, Adult Moderate sedation is given to help you relax or even sleep through a procedure. You may remain sleepy,  be clumsy, or have poor balance for several hours following this procedure. Arrange for a responsible adult, family member, or friend to take you home. A responsible adult should stay with you for at least 24 hours or until the medicines have worn off.  Do not participate in any activities where you could become injured for the next 24 hours, or until you feel normal again. Do not:   Drive.   Swim.   Ride a bicycle.   Operate heavy machinery.   Cook.   Use power tools.   Climb ladders.   Work at [employer].   Do not make important decisions or sign legal documents until you are improved.   Vomiting may occur if you eat too soon. When you can drink without vomiting, try water, juice, or soup. Try solid foods if you feel little or no nausea.   Only take over-the-counter or prescription medications for pain, discomfort, or fever as directed by your caregiver.If pain medications have been prescribed for you, ask your caregiver how soon it is safe to take them.   Make sure you and your family fully understands everything about the medication given to you. Make sure you understand what side  effects may occur.   You should not drink alcohol, take sleeping pills, or medications that cause drowsiness for at least 24 hours.   If you smoke, do not smoke alone.   If you are feeling better, you may resume normal activities 24 hours after receiving sedation.   Keep all appointments as scheduled. Follow all instructions.   Ask questions if you do not understand.  SEEK MEDICAL CARE IF:   Your skin is pale or bluish in color.   You continue to feel sick to your stomach (nauseous) or throw up (vomit).   Your pain is getting worse and not helped by medication.   You have bleeding or swelling.   You are still sleepy or feeling clumsy after 24 hours.  SEEK IMMEDIATE MEDICAL CARE IF:   You develop a rash.   You have difficulty breathing.   You develop any type of allergic problem.   You have a fever.  Document Released: 11/25/2000 Document Revised: 02/19/2011 Document Reviewed: 04/18/2007 [facility] Patient Information 2012 [place].

## 2011-08-03 ENCOUNTER — Other Ambulatory Visit: Payer: Self-pay

## 2011-08-03 DIAGNOSIS — T82898A Other specified complication of vascular prosthetic devices, implants and grafts, initial encounter: Secondary | ICD-10-CM

## 2011-08-04 ENCOUNTER — Encounter: Payer: Self-pay | Admitting: Vascular Surgery

## 2011-08-05 ENCOUNTER — Ambulatory Visit (INDEPENDENT_AMBULATORY_CARE_PROVIDER_SITE_OTHER): Payer: BC Managed Care – PPO | Admitting: Vascular Surgery

## 2011-08-05 ENCOUNTER — Encounter: Payer: Self-pay | Admitting: Vascular Surgery

## 2011-08-05 VITALS — BP 134/80 | HR 71 | Temp 98.2°F | Ht 61.0 in | Wt 175.0 lb

## 2011-08-05 DIAGNOSIS — T82898A Other specified complication of vascular prosthetic devices, implants and grafts, initial encounter: Secondary | ICD-10-CM

## 2011-08-05 DIAGNOSIS — N186 End stage renal disease: Secondary | ICD-10-CM

## 2011-08-05 NOTE — Progress Notes (Signed)
[facility] [place]  Patient name: Mary ONOFREY MRN: [id number] DOB: 06-14-1955 Sex: female  REASON FOR VISIT: possible steal syndrome left upper extremity. Referred by Dr. Jamal Maes  HPI: Mary Wilcox is a 56 y.o. female who had a left upper arm loop AV graft placed on 03/04/2011. I explored the cephalic and basilic veins and she was not a candidate for a fistula. She had a very small brachial artery and therefore are placed an upper arm loop graft on the left. I used a 4-7 tapered PTFE graft because of the risk of steal. Her graft worked well without any symptoms of steal initially. Her grafts occluded on 07/16/2011 and she underwent thrombolyzes by interventional radiology. She states that since that procedure she's had pain in her left hand when she goes for dialysis. This usually occurs the last hour of treatment. He symptoms have been stable and her graft has been working adequately.   REVIEW OF SYSTEMS: Valu.Nieves ] denotes positive finding; [  ] denotes negative finding  CARDIOVASCULAR:  [ ]  chest pain   [ ]  dyspnea on exertion    CONSTITUTIONAL:  [ ]  fever   [ ]  chills  PHYSICAL EXAM: Filed Vitals:   08/05/11 1614  BP: 134/80  Pulse: 71  Temp: 98.2 F (36.8 C)  TempSrc: Oral  Height: 5\' 1"  (1.549 m)  Weight: 175 lb (79.379 kg)   Body mass index is 33.07 kg/(m^2). GENERAL: The patient is a well-nourished female, in no acute distress. The vital signs are documented above. CARDIOVASCULAR: There is a regular rate and rhythm  PULMONARY: There is good air exchange bilaterally without wheezing or rales. She has an excellent thrill in her left upper arm loop graft. I cannot palpate a radial or ulnar pulse on the left. She does have a radial and ulnar signal with the Doppler which augment with compression of her fistula.  MEDICAL ISSUES: This patient presents with symptoms consistent with steal syndrome.  As this is an upper arm loop graft, I do not think she  is a candidate for a DRIL procedure. In addition I used a 4-7 mm tapered graft and therefore there are no real options to revise or plicate the proximal end of the graft to an improved blood flow to the hand. Given that she was not having steal and developed steal after her thrombolyzes, there is the possibility that she had some embolus to her left arm. Before proceeding with a formal arteriogram I would like to obtain an arterial duplex to fully evaluate the left upper extremity for possible embolic disease and also evaluate her graft. Unfortunately her visit was leg enough in the day that the vascular lab was closed we'll bring her back next week for the study. Hopefully this will allow [place] to decide what options we might have to improve the blood flow although at this does not we may have to consider formal arteriography via a femoral approach to further assess the left upper extremity. The only other option would be to proceed with access in the right arm which is very resistant to consider.  [place] [facility] [place] Beeper: [phone]

## 2011-08-11 ENCOUNTER — Encounter: Payer: Self-pay | Admitting: Vascular Surgery

## 2011-08-12 ENCOUNTER — Ambulatory Visit: Payer: BC Managed Care – PPO | Admitting: Vascular Surgery

## 2011-08-18 ENCOUNTER — Encounter: Payer: Self-pay | Admitting: Vascular Surgery

## 2011-08-19 ENCOUNTER — Ambulatory Visit (INDEPENDENT_AMBULATORY_CARE_PROVIDER_SITE_OTHER): Payer: BC Managed Care – PPO | Admitting: Vascular Surgery

## 2011-08-19 ENCOUNTER — Encounter (INDEPENDENT_AMBULATORY_CARE_PROVIDER_SITE_OTHER): Payer: BC Managed Care – PPO | Admitting: *Deleted

## 2011-08-19 ENCOUNTER — Encounter: Payer: Self-pay | Admitting: Vascular Surgery

## 2011-08-19 VITALS — BP 151/81 | HR 74 | Temp 97.9°F | Ht 61.0 in | Wt 172.0 lb

## 2011-08-19 DIAGNOSIS — N186 End stage renal disease: Secondary | ICD-10-CM

## 2011-08-19 DIAGNOSIS — T82598A Other mechanical complication of other cardiac and vascular devices and implants, initial encounter: Secondary | ICD-10-CM

## 2011-08-19 DIAGNOSIS — Z01810 Encounter for preprocedural cardiovascular examination: Secondary | ICD-10-CM

## 2011-08-19 DIAGNOSIS — T82898A Other specified complication of vascular prosthetic devices, implants and grafts, initial encounter: Secondary | ICD-10-CM

## 2011-08-19 NOTE — Progress Notes (Signed)
[facility] [place]  Patient name: Mary Wilcox MRN: [id number] DOB: 04-Aug-1955 Sex: female  REASON FOR VISIT: follow up of steal syndrome left upper extremity  HPI: Mary Wilcox is a 56 y.o. female who had a left upper arm loop AV graft placed on 03/04/2011. She had a very small brachial artery even at the axillary level. A 47 mm tapered graft was used. I saw her on 08/05/2011 with evidence of steal which had developed after she had undergone thrombolisis of her graft on 5-13. She came in today or a  Graft duplex scan. She states that his symptoms in her hand have been stable and certainly have not worsened any. She feels that her symptoms are tolerable. Her graft has been working well.  Of note however, she had a small eschar over her graft near the distal loop on the venous side of her graft. This came off. She has not had any erythema or drainage in this area that she is aware of.   REVIEW OF SYSTEMS: Valu.Nieves ] denotes positive finding; [  ] denotes negative finding  CARDIOVASCULAR:  [ ]  chest pain   [ ]  dyspnea on exertion    CONSTITUTIONAL:  [ ]  fever   [ ]  chills  PHYSICAL EXAM: Filed Vitals:   08/19/11 1536  BP: 151/81  Pulse: 74  Temp: 97.9 F (36.6 C)  TempSrc: Oral  Height: 5\' 1"  (1.549 m)  Weight: 172 lb (78.019 kg)   Body mass index is 32.50 kg/(m^2). GENERAL: The patient is a well-nourished female, in no acute distress. The vital signs are documented above. CARDIOVASCULAR: There is a regular rate and rhythm  PULMONARY: There is good air exchange bilaterally without wheezing or rales. Her graft has an excellent thrill. I cannot palpate radial or oral pulse on the left side. She has a small wound on the venous limb of the graft near the distal loop. Do not see any exposed graft. However I feel that the graft is likely close by.  Graft duplex scan shows markedly elevated velocities in the axillary artery and also in the proximal graft. There also  elevated velocities in the outflow vein. This may simply be related to the very small size of these blood vessels.  MEDICAL ISSUES: Given the wound overlying the graft I have recommended that we explore this area to be sure there is no exposed graft and try to prevent graft infection. I also discussed exploring the arterial and venous anastomoses in the axilla however she was very reluctant to consider this as the graft has been working well and her steal symptoms have improved. Again this may be related to the small size of the vessels. I'll discuss this again when she comes in for surgery which is been scheduled for 08/28/2011. He dialyzes on Tuesdays Thursdays and Saturdays.  [place] [facility] [place] Beeper: [phone]

## 2011-08-26 NOTE — Procedures (Unsigned)
DIALYSIS GRAFT DUPLEX EVALUATION  INDICATION:  Complication of dialysis.  HISTORY: Thrombectomy of left brachial artery, brachial vein loop graft by Radiology, 07/16/2011.  DUPLEX:                                  Duplex Velocities Inflow artery                   Greater than 700 cm/sec Inflow anastomosis              807 cm/s, stenotic and turbulent. Mid arterial limb               123 cm/s, within normal limits Mid graft                       86 cm/s, within normal limits Mid venous limb                 140 cm/s Outflow anastomosis             372 cm/s, within normal limits Outflow vein                    535 cm/s (axillary vein), turbulent with narrowing  IMAGE CHARACTERISTICS:  Stenotic  IMAGE CHARACTERISTICS:  Within normal limits  IMPRESSION: 1. Patent left loop graft. 2. High grade stenosis in the brachial artery of the AVG anastomosis. 3. Questionable narrowing the left axillary vein.  ___________________________________________ Judeth Cornfield. Scot Dock, M.D.  SS/MEDQ  D:  08/19/2011  T:  08/19/2011  Job:  [id number]

## 2011-08-26 NOTE — Procedures (Unsigned)
VASCULAR LAB EXAM  INDICATION:  Complication of dialysis.  History, end-stage renal disease.  HISTORY: Diabetes: Cardiac: Hypertension:  Yes.  EXAM:  Duplex of the left upper extremity arterial system, velocities and waveforms.  IMPRESSION: 1. See attached sheet. 2. High grade stenosis in the left brachial artery at the     arteriovenous graft anastomosis in the proximal upper arm. 3. Technically difficult examination due to the close proximity of the     arteriovenous anastomosis with the arteriovenous graft.  ___________________________________________ Judeth Cornfield. Scot Dock, M.D.  SS/MEDQ  D:  08/19/2011  T:  08/19/2011  Job:  [id number]

## 2011-08-27 ENCOUNTER — Other Ambulatory Visit: Payer: Self-pay | Admitting: *Deleted

## 2011-08-27 ENCOUNTER — Encounter (HOSPITAL_COMMUNITY): Payer: Self-pay | Admitting: *Deleted

## 2011-08-27 MED ORDER — DEXTROSE 5 % IV SOLN
1.5000 g | INTRAVENOUS | Status: AC
  Administered 2011-08-28: 1.5 g via INTRAVENOUS
  Filled 2011-08-27: qty 1.5

## 2011-08-27 MED ORDER — SODIUM CHLORIDE 0.9 % IV SOLN
INTRAVENOUS | Status: DC
  Administered 2011-08-28: 12:00:00 via INTRAVENOUS

## 2011-08-27 NOTE — Progress Notes (Signed)
T/TH/Sat Dialysis on [place]

## 2011-08-27 NOTE — Progress Notes (Signed)
Pt doesn't have a cardiologist   Denies ever having an echo/stress test/heart cath  Medical MD is Dr[facility]bb [facility]  ekg in epic from 03/05/11 cxr in epic from 03/2011

## 2011-08-28 ENCOUNTER — Ambulatory Visit (HOSPITAL_COMMUNITY): Payer: BC Managed Care – PPO

## 2011-08-28 ENCOUNTER — Encounter (HOSPITAL_COMMUNITY)
Admission: RE | Disposition: A | Discharge status: Home / Self Care | Payer: Self-pay | Source: Ambulatory Visit | Attending: Vascular Surgery

## 2011-08-28 ENCOUNTER — Ambulatory Visit (HOSPITAL_COMMUNITY)
Admission: RE | Admit: 2011-08-28 | Discharge: 2011-08-28 | Disposition: A | Discharge status: Home / Self Care | Payer: BC Managed Care – PPO | Source: Ambulatory Visit | Attending: Vascular Surgery | Admitting: Vascular Surgery

## 2011-08-28 ENCOUNTER — Encounter (HOSPITAL_COMMUNITY): Payer: Self-pay

## 2011-08-28 ENCOUNTER — Encounter (HOSPITAL_COMMUNITY): Payer: Self-pay | Admitting: Anesthesiology

## 2011-08-28 ENCOUNTER — Encounter (HOSPITAL_COMMUNITY): Payer: Self-pay | Admitting: Pharmacy Technician

## 2011-08-28 DIAGNOSIS — N186 End stage renal disease: Secondary | ICD-10-CM

## 2011-08-28 DIAGNOSIS — T82898A Other specified complication of vascular prosthetic devices, implants and grafts, initial encounter: Secondary | ICD-10-CM | POA: Insufficient documentation

## 2011-08-28 DIAGNOSIS — Y849 Medical procedure, unspecified as the cause of abnormal reaction of the patient, or of later complication, without mention of misadventure at the time of the procedure: Secondary | ICD-10-CM | POA: Insufficient documentation

## 2011-08-28 DIAGNOSIS — G458 Other transient cerebral ischemic attacks and related syndromes: Secondary | ICD-10-CM

## 2011-08-28 HISTORY — DX: Insomnia, unspecified: G47.00

## 2011-08-28 LAB — PROTIME-INR
INR: 1.04 (ref 0.00–1.49)
Prothrombin Time: 13.8 seconds (ref 11.6–15.2)

## 2011-08-28 LAB — POCT I-STAT 4, (NA,K, GLUC, HGB,HCT)
Glucose, Bld: 97 mg/dL (ref 70–99)
HCT: 37 % (ref 36.0–46.0)
Hemoglobin: 12.6 g/dL (ref 12.0–15.0)
Potassium: 4.5 mEq/L (ref 3.5–5.1)
Sodium: 137 mEq/L (ref 135–145)

## 2011-08-28 LAB — SURGICAL PCR SCREEN
MRSA, PCR: NEGATIVE
Staphylococcus aureus: NEGATIVE

## 2011-08-28 SURGERY — REVISION OF ARTERIOVENOUS GORETEX GRAFT
Anesthesia: Monitor Anesthesia Care | Site: Arm Upper | Laterality: Left | Wound class: Clean

## 2011-08-28 MED ORDER — SODIUM CHLORIDE 0.9 % IR SOLN
Status: DC | PRN
  Administered 2011-08-28: 13:00:00

## 2011-08-28 MED ORDER — FENTANYL CITRATE 0.05 MG/ML IJ SOLN
INTRAMUSCULAR | Status: DC | PRN
  Administered 2011-08-28: 100 ug via INTRAVENOUS

## 2011-08-28 MED ORDER — HYDROMORPHONE HCL PF 1 MG/ML IJ SOLN: 0.2500 mg | INTRAMUSCULAR | Status: DC | PRN

## 2011-08-28 MED ORDER — PROPOFOL 10 MG/ML IV EMUL
INTRAVENOUS | Status: DC | PRN
  Administered 2011-08-28: 140 mg via INTRAVENOUS

## 2011-08-28 MED ORDER — MUPIROCIN 2 % EX OINT
TOPICAL_OINTMENT | CUTANEOUS | Status: AC
  Administered 2011-08-28: 1 via NASAL
  Filled 2011-08-28: qty 22

## 2011-08-28 MED ORDER — SODIUM CHLORIDE 0.9 % IV SOLN
INTRAVENOUS | Status: DC | PRN
  Administered 2011-08-28: 12:00:00 via INTRAVENOUS

## 2011-08-28 MED ORDER — OXYCODONE HCL 5 MG PO TABS: 5.0000 mg | ORAL_TABLET | ORAL | Status: AC | PRN

## 2011-08-28 MED ORDER — 0.9 % SODIUM CHLORIDE (POUR BTL) OPTIME
TOPICAL | Status: DC | PRN
  Administered 2011-08-28: 1000 mL

## 2011-08-28 MED ORDER — MUPIROCIN 2 % EX OINT
TOPICAL_OINTMENT | Freq: Two times a day (BID) | CUTANEOUS | Status: DC
  Filled 2011-08-28: qty 22

## 2011-08-28 MED ORDER — ONDANSETRON HCL 4 MG/2ML IJ SOLN: 4.0000 mg | Freq: Once | INTRAMUSCULAR | Status: DC | PRN

## 2011-08-28 MED ORDER — MIDAZOLAM HCL 5 MG/5ML IJ SOLN
INTRAMUSCULAR | Status: DC | PRN
  Administered 2011-08-28: 2 mg via INTRAVENOUS

## 2011-08-28 MED ORDER — HYDROMORPHONE HCL PF 1 MG/ML IJ SOLN
INTRAMUSCULAR | Status: AC
  Filled 2011-08-28: qty 1

## 2011-08-28 MED ORDER — HYDROMORPHONE HCL PF 1 MG/ML IJ SOLN
0.2500 mg | INTRAMUSCULAR | Status: DC | PRN
  Administered 2011-08-28 (×2): 0.5 mg via INTRAVENOUS

## 2011-08-28 SURGICAL SUPPLY — 39 items
ADH SKN CLS APL DERMABOND .7 (GAUZE/BANDAGES/DRESSINGS) ×1
ADH SKN CLS LQ APL DERMABOND (GAUZE/BANDAGES/DRESSINGS) ×1
CANISTER SUCTION 2500CC (MISCELLANEOUS) ×2 IMPLANT
CLIP TI MEDIUM 6 (CLIP) ×2 IMPLANT
CLIP TI WIDE RED SMALL 6 (CLIP) ×2 IMPLANT
CLOTH BEACON ORANGE TIMEOUT ST (SAFETY) ×2 IMPLANT
COVER SURGICAL LIGHT HANDLE (MISCELLANEOUS) ×4 IMPLANT
DECANTER SPIKE VIAL GLASS SM (MISCELLANEOUS) ×2 IMPLANT
DERMABOND ADHESIVE PROPEN (GAUZE/BANDAGES/DRESSINGS) ×1
DERMABOND ADVANCED (GAUZE/BANDAGES/DRESSINGS) ×1
DERMABOND ADVANCED .7 DNX12 (GAUZE/BANDAGES/DRESSINGS) ×1 IMPLANT
DERMABOND ADVANCED .7 DNX6 (GAUZE/BANDAGES/DRESSINGS) IMPLANT
ELECT REM PT RETURN 9FT ADLT (ELECTROSURGICAL) ×2
ELECTRODE REM PT RTRN 9FT ADLT (ELECTROSURGICAL) ×1 IMPLANT
GLOVE BIO SURGEON STRL SZ 6.5 (GLOVE) ×1 IMPLANT
GLOVE BIO SURGEON STRL SZ7.5 (GLOVE) ×2 IMPLANT
GLOVE BIOGEL PI IND STRL 7.0 (GLOVE) IMPLANT
GLOVE BIOGEL PI IND STRL 7.5 (GLOVE) ×1 IMPLANT
GLOVE BIOGEL PI INDICATOR 7.0 (GLOVE) ×3
GLOVE BIOGEL PI INDICATOR 7.5 (GLOVE) ×1
GLOVE SURG SS PI 7.0 STRL IVOR (GLOVE) ×1 IMPLANT
GLOVE SURG SS PI 7.5 STRL IVOR (GLOVE) ×1 IMPLANT
GOWN STRL NON-REIN LRG LVL3 (GOWN DISPOSABLE) ×4 IMPLANT
GOWN STRL REIN 2XL XLG LVL4 (GOWN DISPOSABLE) ×1 IMPLANT
GOWN STRL REIN XL XLG (GOWN DISPOSABLE) ×1 IMPLANT
KIT BASIN OR (CUSTOM PROCEDURE TRAY) ×2 IMPLANT
KIT ROOM TURNOVER OR (KITS) ×2 IMPLANT
NS IRRIG 1000ML POUR BTL (IV SOLUTION) ×2 IMPLANT
PACK CV ACCESS (CUSTOM PROCEDURE TRAY) ×2 IMPLANT
PAD ARMBOARD 7.5X6 YLW CONV (MISCELLANEOUS) ×4 IMPLANT
SPONGE SURGIFOAM ABS GEL 100 (HEMOSTASIS) IMPLANT
SUT PROLENE 6 0 BV (SUTURE) ×4 IMPLANT
SUT VIC AB 3-0 SH 27 (SUTURE) ×4
SUT VIC AB 3-0 SH 27X BRD (SUTURE) ×2 IMPLANT
SUT VICRYL 4-0 PS2 18IN ABS (SUTURE) ×4 IMPLANT
TOWEL OR 17X24 6PK STRL BLUE (TOWEL DISPOSABLE) ×2 IMPLANT
TOWEL OR 17X26 10 PK STRL BLUE (TOWEL DISPOSABLE) ×2 IMPLANT
UNDERPAD 30X30 INCONTINENT (UNDERPADS AND DIAPERS) ×2 IMPLANT
WATER STERILE IRR 1000ML POUR (IV SOLUTION) ×2 IMPLANT

## 2011-08-28 NOTE — Transfer of Care (Signed)
Immediate Anesthesia Transfer of Care Note  Patient: Mary Wilcox  Procedure(s) Performed: Procedure(s) (LRB): REVISION OF ARTERIOVENOUS GORETEX GRAFT (Left)  Patient Location: PACU  Anesthesia Type: General  Level of Consciousness: awake, alert , oriented and sedated  Airway & Oxygen Therapy: Patient Spontanous Breathing and Patient connected to nasal cannula oxygen  Post-op Assessment: Report given to PACU RN, Post -op Vital signs reviewed and stable and Patient moving all extremities  Post vital signs: Reviewed and stable  Complications: No apparent anesthesia complications

## 2011-08-28 NOTE — Preoperative (Signed)
Beta Blockers   Reason not to administer Beta Blockers:Not Applicable 

## 2011-08-28 NOTE — Anesthesia Postprocedure Evaluation (Signed)
  Anesthesia Post-op Note  Patient: Mary Wilcox  Procedure(s) Performed: Procedure(s) (LRB): REVISION OF ARTERIOVENOUS GORETEX GRAFT (Left)  Patient Location: PACU  Anesthesia Type: General  Level of Consciousness: awake, alert  and oriented  Airway and Oxygen Therapy: Patient Spontanous Breathing  Post-op Pain: none  Post-op Assessment: Post-op Vital signs reviewed and [name] Cardiovascular Status Stable  Post-op Vital Signs: stable  Complications: No apparent anesthesia complications

## 2011-08-28 NOTE — Anesthesia Preprocedure Evaluation (Signed)
Anesthesia Evaluation  Patient identified by MRN, date of birth, ID band Patient awake    Reviewed: Allergy & Precautions, H&P , NPO status , [name] Chart, lab work & pertinent test results  Airway Mallampati: II TM Distance: >3 FB Neck ROM: Full    Dental  (+) Partial Lower and Partial Upper   Pulmonary  breath sounds clear to auscultation        Cardiovascular Rhythm:Regular Rate:Normal     Neuro/Psych    GI/Hepatic   Endo/Other    Renal/GU      Musculoskeletal   Abdominal   Peds  Hematology   Anesthesia Other Findings   Reproductive/Obstetrics                           Anesthesia Physical Anesthesia Plan  ASA: III  Anesthesia Plan: General   Post-op Pain Management:    Induction: Intravenous  Airway Management Planned: LMA  Additional Equipment:   Intra-op Plan:   Post-operative Plan:   Informed Consent: I have reviewed the patients History and Physical, chart, labs and discussed the procedure including the risks, benefits and alternatives for the proposed anesthesia with the patient or authorized representative who has indicated his/her understanding and acceptance.   Dental advisory given  Plan Discussed with:   Anesthesia Plan Comments: (ESRD last HD 6/13 K- 4.6 H/O cervical Ca S/P XRT Chemo Rx Mild obesity  Plan GA with LMA  Roberts Gaudy, MD)        Anesthesia Quick Evaluation

## 2011-08-28 NOTE — Op Note (Signed)
NAME: Mary Wilcox   MRN: [id number] DOB: 01-Feb-1956    DATE OF OPERATION: 08/28/2011  PREOP DIAGNOSIS: exposed segment of left upper arm AV graft   POSTOP DIAGNOSIS: same  PROCEDURE: exploration of left upper arm AV graft wound.  SURGEON: Judeth Cornfield. Scot Dock, MD, FACS  ASSIST: Wray Kearns PA  ANESTHESIA: Gen.   EBL: minimal  INDICATIONS: Mary Wilcox is a 56 y.o. female resident functioning left upper arm AV graft. She was noted to have a small wound over the distal loop of the graft on the venous half of the graft. Felt it was necessary to expose to be sure there was no evidence of graft infection were exposed graft.  FINDINGS: there was no evidence of gross graft infection. There was no drainage. The graft was well incorporated.  TECHNIQUE: Patient was brought to the operating room and received a general anesthetic. The left upper extremity was prepped and draped in the usual sterile fashion. An elliptical incision was made encompassing the area of concern over the left arm graft. The dissection was carried down to the graft which was well incorporated. The wound appear to tract to one small area of the graft although there was no gross purulence in this area. I mobilize soft tissue on both sides of the wound and then closed in 2 layers with a deep layer of 3-0 Vicryl, and a subcuticular layer of 4-0 Vicryl. Dermabond was applied. Patient tolerated the procedure well and was transferred to the recovery room in stable condition. All needle and sponge counts were correct.  Deitra Mayo, MD, FACS [facility]  DATE OF DICTATION:   08/28/2011

## 2011-08-28 NOTE — H&P (View-Only) (Signed)
[facility] [place]  Patient name: Mary Wilcox MRN: [id number] DOB: September 12, 1955 Sex: female  REASON FOR VISIT: follow up of steal syndrome left upper extremity  HPI: Mary Wilcox is a 56 y.o. female who had a left upper arm loop AV graft placed on 03/04/2011. She had a very small brachial artery even at the axillary level. A 47 mm tapered graft was used. I saw her on 08/05/2011 with evidence of steal which had developed after she had undergone thrombolisis of her graft on 5-13. She came in today or a  Graft duplex scan. She states that his symptoms in her hand have been stable and certainly have not worsened any. She feels that her symptoms are tolerable. Her graft has been working well.  Of note however, she had a small eschar over her graft near the distal loop on the venous side of her graft. This came off. She has not had any erythema or drainage in this area that she is aware of.   REVIEW OF SYSTEMS: Valu.Nieves ] denotes positive finding; [  ] denotes negative finding  CARDIOVASCULAR:  [ ]  chest pain   [ ]  dyspnea on exertion    CONSTITUTIONAL:  [ ]  fever   [ ]  chills  PHYSICAL EXAM: Filed Vitals:   08/19/11 1536  BP: 151/81  Pulse: 74  Temp: 97.9 F (36.6 C)  TempSrc: Oral  Height: 5\' 1"  (1.549 m)  Weight: 172 lb (78.019 kg)   Body mass index is 32.50 kg/(m^2). GENERAL: The patient is a well-nourished female, in no acute distress. The vital signs are documented above. CARDIOVASCULAR: There is a regular rate and rhythm  PULMONARY: There is good air exchange bilaterally without wheezing or rales. Her graft has an excellent thrill. I cannot palpate radial or oral pulse on the left side. She has a small wound on the venous limb of the graft near the distal loop. Do not see any exposed graft. However I feel that the graft is likely close by.  Graft duplex scan shows markedly elevated velocities in the axillary artery and also in the proximal graft. There also  elevated velocities in the outflow vein. This may simply be related to the very small size of these blood vessels.  MEDICAL ISSUES: Given the wound overlying the graft I have recommended that we explore this area to be sure there is no exposed graft and try to prevent graft infection. I also discussed exploring the arterial and venous anastomoses in the axilla however she was very reluctant to consider this as the graft has been working well and her steal symptoms have improved. Again this may be related to the small size of the vessels. I'll discuss this again when she comes in for surgery which is been scheduled for 08/28/2011. He dialyzes on Tuesdays Thursdays and Saturdays.  [place] [facility] [place] Beeper: [phone]

## 2011-08-28 NOTE — Anesthesia Postprocedure Evaluation (Signed)
  Anesthesia Post-op Note  Patient: Mary Wilcox  Procedure(s) Performed: Procedure(s) (LRB): REVISION OF ARTERIOVENOUS GORETEX GRAFT (Left)  Patient Location: PACU  Anesthesia Type: General  Level of Consciousness: awake, alert  and oriented  Airway and Oxygen Therapy: Patient Spontanous Breathing and Patient connected to nasal cannula oxygen  Post-op Pain: none  Post-op Assessment: Post-op Vital signs reviewed and [name] Cardiovascular Status Stable  Post-op Vital Signs: stable  Complications: No apparent anesthesia complications

## 2011-08-28 NOTE — Interval H&P Note (Signed)
History and Physical Interval Note:  08/28/2011 11:45 AM  Mary Wilcox  has presented today for surgery, with the diagnosis of ESRD,COMPLICATION AVG  The various methods of treatment have been discussed with the patient and family. After consideration of risks, benefits and other options for treatment, the patient has consented to: Datil (Left) as a surgical intervention .  The patients' history has been reviewed, patient examined, no change in status, stable for surgery.  I have reviewed the patients' chart and labs.  Questions were answered to the patient's satisfaction.     Larine Fielding S

## 2011-08-31 ENCOUNTER — Telehealth: Payer: Self-pay | Admitting: Vascular Surgery

## 2011-08-31 NOTE — Telephone Encounter (Signed)
Message copied by Gena Fray on Mon Aug 31, 2011  9:27 AM ------      Message from: Alfonso Patten      Created: Fri Aug 28, 2011  4:48 PM      Regarding: FW: charges       FYI NO FOLLOW UP UNLESS NEW PROBLEM      ----- Message -----         From: Angelia Mould, MD         Sent: 08/28/2011   1:35 PM           To: Patrici Ranks, Alfonso Patten, RN      Subject: charges                                                  PROCEDURE: exploration of left upper arm AV graft wound.            SURGEON: Judeth Cornfield. Scot Dock, MD, FACS            ASSIST: Wray Kearns PA            No F/U needed            CSD

## 2011-08-31 NOTE — Telephone Encounter (Signed)
No follow up needed per CSD, documentation only, dpm

## 2011-12-28 NOTE — Progress Notes (Signed)
This encounter was created in error - please disregard.

## 2016-03-17 DIAGNOSIS — N2581 Secondary hyperparathyroidism of renal origin: Secondary | ICD-10-CM | POA: Diagnosis not present

## 2016-03-17 DIAGNOSIS — D631 Anemia in chronic kidney disease: Secondary | ICD-10-CM | POA: Diagnosis not present

## 2016-03-17 DIAGNOSIS — N186 End stage renal disease: Secondary | ICD-10-CM | POA: Diagnosis not present

## 2016-03-19 DIAGNOSIS — N186 End stage renal disease: Secondary | ICD-10-CM | POA: Diagnosis not present

## 2016-03-19 DIAGNOSIS — N2581 Secondary hyperparathyroidism of renal origin: Secondary | ICD-10-CM | POA: Diagnosis not present

## 2016-03-19 DIAGNOSIS — D631 Anemia in chronic kidney disease: Secondary | ICD-10-CM | POA: Diagnosis not present

## 2016-03-21 DIAGNOSIS — N186 End stage renal disease: Secondary | ICD-10-CM | POA: Diagnosis not present

## 2016-03-21 DIAGNOSIS — D631 Anemia in chronic kidney disease: Secondary | ICD-10-CM | POA: Diagnosis not present

## 2016-03-21 DIAGNOSIS — N2581 Secondary hyperparathyroidism of renal origin: Secondary | ICD-10-CM | POA: Diagnosis not present

## 2016-03-24 DIAGNOSIS — N2581 Secondary hyperparathyroidism of renal origin: Secondary | ICD-10-CM | POA: Diagnosis not present

## 2016-03-24 DIAGNOSIS — D631 Anemia in chronic kidney disease: Secondary | ICD-10-CM | POA: Diagnosis not present

## 2016-03-24 DIAGNOSIS — N186 End stage renal disease: Secondary | ICD-10-CM | POA: Diagnosis not present

## 2016-03-26 DIAGNOSIS — N186 End stage renal disease: Secondary | ICD-10-CM | POA: Diagnosis not present

## 2016-03-26 DIAGNOSIS — N2581 Secondary hyperparathyroidism of renal origin: Secondary | ICD-10-CM | POA: Diagnosis not present

## 2016-03-26 DIAGNOSIS — D631 Anemia in chronic kidney disease: Secondary | ICD-10-CM | POA: Diagnosis not present

## 2016-03-28 DIAGNOSIS — N186 End stage renal disease: Secondary | ICD-10-CM | POA: Diagnosis not present

## 2016-03-28 DIAGNOSIS — N2581 Secondary hyperparathyroidism of renal origin: Secondary | ICD-10-CM | POA: Diagnosis not present

## 2016-03-28 DIAGNOSIS — D631 Anemia in chronic kidney disease: Secondary | ICD-10-CM | POA: Diagnosis not present

## 2016-03-31 DIAGNOSIS — N2581 Secondary hyperparathyroidism of renal origin: Secondary | ICD-10-CM | POA: Diagnosis not present

## 2016-03-31 DIAGNOSIS — N186 End stage renal disease: Secondary | ICD-10-CM | POA: Diagnosis not present

## 2016-03-31 DIAGNOSIS — D631 Anemia in chronic kidney disease: Secondary | ICD-10-CM | POA: Diagnosis not present

## 2016-04-03 DIAGNOSIS — D631 Anemia in chronic kidney disease: Secondary | ICD-10-CM | POA: Diagnosis not present

## 2016-04-03 DIAGNOSIS — N2581 Secondary hyperparathyroidism of renal origin: Secondary | ICD-10-CM | POA: Diagnosis not present

## 2016-04-03 DIAGNOSIS — N186 End stage renal disease: Secondary | ICD-10-CM | POA: Diagnosis not present

## 2016-04-04 DIAGNOSIS — N2581 Secondary hyperparathyroidism of renal origin: Secondary | ICD-10-CM | POA: Diagnosis not present

## 2016-04-04 DIAGNOSIS — N186 End stage renal disease: Secondary | ICD-10-CM | POA: Diagnosis not present

## 2016-04-04 DIAGNOSIS — D631 Anemia in chronic kidney disease: Secondary | ICD-10-CM | POA: Diagnosis not present

## 2016-04-07 DIAGNOSIS — N2581 Secondary hyperparathyroidism of renal origin: Secondary | ICD-10-CM | POA: Diagnosis not present

## 2016-04-07 DIAGNOSIS — N186 End stage renal disease: Secondary | ICD-10-CM | POA: Diagnosis not present

## 2016-04-07 DIAGNOSIS — D631 Anemia in chronic kidney disease: Secondary | ICD-10-CM | POA: Diagnosis not present

## 2016-04-09 DIAGNOSIS — N186 End stage renal disease: Secondary | ICD-10-CM | POA: Diagnosis not present

## 2016-04-09 DIAGNOSIS — D631 Anemia in chronic kidney disease: Secondary | ICD-10-CM | POA: Diagnosis not present

## 2016-04-09 DIAGNOSIS — N2581 Secondary hyperparathyroidism of renal origin: Secondary | ICD-10-CM | POA: Diagnosis not present

## 2016-04-11 DIAGNOSIS — N186 End stage renal disease: Secondary | ICD-10-CM | POA: Diagnosis not present

## 2016-04-11 DIAGNOSIS — D631 Anemia in chronic kidney disease: Secondary | ICD-10-CM | POA: Diagnosis not present

## 2016-04-11 DIAGNOSIS — N2581 Secondary hyperparathyroidism of renal origin: Secondary | ICD-10-CM | POA: Diagnosis not present

## 2016-04-14 DIAGNOSIS — N2581 Secondary hyperparathyroidism of renal origin: Secondary | ICD-10-CM | POA: Diagnosis not present

## 2016-04-14 DIAGNOSIS — D631 Anemia in chronic kidney disease: Secondary | ICD-10-CM | POA: Diagnosis not present

## 2016-04-14 DIAGNOSIS — N186 End stage renal disease: Secondary | ICD-10-CM | POA: Diagnosis not present

## 2016-04-15 DIAGNOSIS — N139 Obstructive and reflux uropathy, unspecified: Secondary | ICD-10-CM | POA: Diagnosis not present

## 2016-04-15 DIAGNOSIS — N186 End stage renal disease: Secondary | ICD-10-CM | POA: Diagnosis not present

## 2016-04-15 DIAGNOSIS — Z992 Dependence on renal dialysis: Secondary | ICD-10-CM | POA: Diagnosis not present

## 2016-04-16 DIAGNOSIS — N2581 Secondary hyperparathyroidism of renal origin: Secondary | ICD-10-CM | POA: Diagnosis not present

## 2016-04-16 DIAGNOSIS — N186 End stage renal disease: Secondary | ICD-10-CM | POA: Diagnosis not present

## 2016-04-18 DIAGNOSIS — N2581 Secondary hyperparathyroidism of renal origin: Secondary | ICD-10-CM | POA: Diagnosis not present

## 2016-04-18 DIAGNOSIS — N186 End stage renal disease: Secondary | ICD-10-CM | POA: Diagnosis not present

## 2016-04-21 DIAGNOSIS — N186 End stage renal disease: Secondary | ICD-10-CM | POA: Diagnosis not present

## 2016-04-21 DIAGNOSIS — N2581 Secondary hyperparathyroidism of renal origin: Secondary | ICD-10-CM | POA: Diagnosis not present

## 2016-04-23 DIAGNOSIS — N186 End stage renal disease: Secondary | ICD-10-CM | POA: Diagnosis not present

## 2016-04-23 DIAGNOSIS — N2581 Secondary hyperparathyroidism of renal origin: Secondary | ICD-10-CM | POA: Diagnosis not present

## 2016-04-25 DIAGNOSIS — N2581 Secondary hyperparathyroidism of renal origin: Secondary | ICD-10-CM | POA: Diagnosis not present

## 2016-04-25 DIAGNOSIS — N186 End stage renal disease: Secondary | ICD-10-CM | POA: Diagnosis not present

## 2016-04-28 DIAGNOSIS — N2581 Secondary hyperparathyroidism of renal origin: Secondary | ICD-10-CM | POA: Diagnosis not present

## 2016-04-28 DIAGNOSIS — N186 End stage renal disease: Secondary | ICD-10-CM | POA: Diagnosis not present

## 2016-04-30 DIAGNOSIS — N2581 Secondary hyperparathyroidism of renal origin: Secondary | ICD-10-CM | POA: Diagnosis not present

## 2016-04-30 DIAGNOSIS — N186 End stage renal disease: Secondary | ICD-10-CM | POA: Diagnosis not present

## 2016-05-02 DIAGNOSIS — N2581 Secondary hyperparathyroidism of renal origin: Secondary | ICD-10-CM | POA: Diagnosis not present

## 2016-05-02 DIAGNOSIS — N186 End stage renal disease: Secondary | ICD-10-CM | POA: Diagnosis not present

## 2016-05-05 DIAGNOSIS — N186 End stage renal disease: Secondary | ICD-10-CM | POA: Diagnosis not present

## 2016-05-05 DIAGNOSIS — N2581 Secondary hyperparathyroidism of renal origin: Secondary | ICD-10-CM | POA: Diagnosis not present

## 2016-05-07 DIAGNOSIS — N186 End stage renal disease: Secondary | ICD-10-CM | POA: Diagnosis not present

## 2016-05-07 DIAGNOSIS — N2581 Secondary hyperparathyroidism of renal origin: Secondary | ICD-10-CM | POA: Diagnosis not present

## 2016-05-09 DIAGNOSIS — N186 End stage renal disease: Secondary | ICD-10-CM | POA: Diagnosis not present

## 2016-05-09 DIAGNOSIS — N2581 Secondary hyperparathyroidism of renal origin: Secondary | ICD-10-CM | POA: Diagnosis not present

## 2016-05-12 DIAGNOSIS — N186 End stage renal disease: Secondary | ICD-10-CM | POA: Diagnosis not present

## 2016-05-12 DIAGNOSIS — N2581 Secondary hyperparathyroidism of renal origin: Secondary | ICD-10-CM | POA: Diagnosis not present

## 2016-05-13 DIAGNOSIS — N186 End stage renal disease: Secondary | ICD-10-CM | POA: Diagnosis not present

## 2016-05-13 DIAGNOSIS — Z992 Dependence on renal dialysis: Secondary | ICD-10-CM | POA: Diagnosis not present

## 2016-05-13 DIAGNOSIS — N139 Obstructive and reflux uropathy, unspecified: Secondary | ICD-10-CM | POA: Diagnosis not present

## 2016-05-14 DIAGNOSIS — N2581 Secondary hyperparathyroidism of renal origin: Secondary | ICD-10-CM | POA: Diagnosis not present

## 2016-05-14 DIAGNOSIS — N186 End stage renal disease: Secondary | ICD-10-CM | POA: Diagnosis not present

## 2016-05-16 DIAGNOSIS — N2581 Secondary hyperparathyroidism of renal origin: Secondary | ICD-10-CM | POA: Diagnosis not present

## 2016-05-16 DIAGNOSIS — N186 End stage renal disease: Secondary | ICD-10-CM | POA: Diagnosis not present

## 2016-05-19 DIAGNOSIS — N2581 Secondary hyperparathyroidism of renal origin: Secondary | ICD-10-CM | POA: Diagnosis not present

## 2016-05-19 DIAGNOSIS — N186 End stage renal disease: Secondary | ICD-10-CM | POA: Diagnosis not present

## 2016-05-20 DIAGNOSIS — Z992 Dependence on renal dialysis: Secondary | ICD-10-CM | POA: Diagnosis not present

## 2016-05-20 DIAGNOSIS — I871 Compression of vein: Secondary | ICD-10-CM | POA: Diagnosis not present

## 2016-05-20 DIAGNOSIS — N186 End stage renal disease: Secondary | ICD-10-CM | POA: Diagnosis not present

## 2016-05-20 DIAGNOSIS — T82858A Stenosis of vascular prosthetic devices, implants and grafts, initial encounter: Secondary | ICD-10-CM | POA: Diagnosis not present

## 2016-05-21 DIAGNOSIS — N2581 Secondary hyperparathyroidism of renal origin: Secondary | ICD-10-CM | POA: Diagnosis not present

## 2016-05-21 DIAGNOSIS — N186 End stage renal disease: Secondary | ICD-10-CM | POA: Diagnosis not present

## 2016-05-23 DIAGNOSIS — N186 End stage renal disease: Secondary | ICD-10-CM | POA: Diagnosis not present

## 2016-05-23 DIAGNOSIS — N2581 Secondary hyperparathyroidism of renal origin: Secondary | ICD-10-CM | POA: Diagnosis not present

## 2016-05-26 DIAGNOSIS — N186 End stage renal disease: Secondary | ICD-10-CM | POA: Diagnosis not present

## 2016-05-26 DIAGNOSIS — N2581 Secondary hyperparathyroidism of renal origin: Secondary | ICD-10-CM | POA: Diagnosis not present

## 2016-05-28 DIAGNOSIS — N186 End stage renal disease: Secondary | ICD-10-CM | POA: Diagnosis not present

## 2016-05-28 DIAGNOSIS — N2581 Secondary hyperparathyroidism of renal origin: Secondary | ICD-10-CM | POA: Diagnosis not present

## 2016-05-30 DIAGNOSIS — N2581 Secondary hyperparathyroidism of renal origin: Secondary | ICD-10-CM | POA: Diagnosis not present

## 2016-05-30 DIAGNOSIS — N186 End stage renal disease: Secondary | ICD-10-CM | POA: Diagnosis not present

## 2016-06-02 DIAGNOSIS — N2581 Secondary hyperparathyroidism of renal origin: Secondary | ICD-10-CM | POA: Diagnosis not present

## 2016-06-02 DIAGNOSIS — N186 End stage renal disease: Secondary | ICD-10-CM | POA: Diagnosis not present

## 2016-06-04 DIAGNOSIS — N2581 Secondary hyperparathyroidism of renal origin: Secondary | ICD-10-CM | POA: Diagnosis not present

## 2016-06-04 DIAGNOSIS — N186 End stage renal disease: Secondary | ICD-10-CM | POA: Diagnosis not present

## 2016-06-06 DIAGNOSIS — N186 End stage renal disease: Secondary | ICD-10-CM | POA: Diagnosis not present

## 2016-06-06 DIAGNOSIS — N2581 Secondary hyperparathyroidism of renal origin: Secondary | ICD-10-CM | POA: Diagnosis not present

## 2016-06-09 DIAGNOSIS — N186 End stage renal disease: Secondary | ICD-10-CM | POA: Diagnosis not present

## 2016-06-09 DIAGNOSIS — N2581 Secondary hyperparathyroidism of renal origin: Secondary | ICD-10-CM | POA: Diagnosis not present

## 2016-06-11 DIAGNOSIS — N2581 Secondary hyperparathyroidism of renal origin: Secondary | ICD-10-CM | POA: Diagnosis not present

## 2016-06-11 DIAGNOSIS — N186 End stage renal disease: Secondary | ICD-10-CM | POA: Diagnosis not present

## 2016-06-13 DIAGNOSIS — N186 End stage renal disease: Secondary | ICD-10-CM | POA: Diagnosis not present

## 2016-06-13 DIAGNOSIS — Z992 Dependence on renal dialysis: Secondary | ICD-10-CM | POA: Diagnosis not present

## 2016-06-13 DIAGNOSIS — N139 Obstructive and reflux uropathy, unspecified: Secondary | ICD-10-CM | POA: Diagnosis not present

## 2016-06-13 DIAGNOSIS — N2581 Secondary hyperparathyroidism of renal origin: Secondary | ICD-10-CM | POA: Diagnosis not present

## 2016-06-15 DIAGNOSIS — N186 End stage renal disease: Secondary | ICD-10-CM | POA: Diagnosis not present

## 2016-06-15 DIAGNOSIS — N2581 Secondary hyperparathyroidism of renal origin: Secondary | ICD-10-CM | POA: Diagnosis not present

## 2016-06-16 DIAGNOSIS — N186 End stage renal disease: Secondary | ICD-10-CM | POA: Diagnosis not present

## 2016-06-16 DIAGNOSIS — N2581 Secondary hyperparathyroidism of renal origin: Secondary | ICD-10-CM | POA: Diagnosis not present

## 2016-06-18 DIAGNOSIS — N2581 Secondary hyperparathyroidism of renal origin: Secondary | ICD-10-CM | POA: Diagnosis not present

## 2016-06-18 DIAGNOSIS — N186 End stage renal disease: Secondary | ICD-10-CM | POA: Diagnosis not present

## 2016-06-20 DIAGNOSIS — N2581 Secondary hyperparathyroidism of renal origin: Secondary | ICD-10-CM | POA: Diagnosis not present

## 2016-06-20 DIAGNOSIS — N186 End stage renal disease: Secondary | ICD-10-CM | POA: Diagnosis not present

## 2016-06-23 DIAGNOSIS — N2581 Secondary hyperparathyroidism of renal origin: Secondary | ICD-10-CM | POA: Diagnosis not present

## 2016-06-23 DIAGNOSIS — N186 End stage renal disease: Secondary | ICD-10-CM | POA: Diagnosis not present

## 2016-06-25 DIAGNOSIS — N2581 Secondary hyperparathyroidism of renal origin: Secondary | ICD-10-CM | POA: Diagnosis not present

## 2016-06-25 DIAGNOSIS — N186 End stage renal disease: Secondary | ICD-10-CM | POA: Diagnosis not present

## 2016-06-27 DIAGNOSIS — N2581 Secondary hyperparathyroidism of renal origin: Secondary | ICD-10-CM | POA: Diagnosis not present

## 2016-06-27 DIAGNOSIS — N186 End stage renal disease: Secondary | ICD-10-CM | POA: Diagnosis not present

## 2016-06-30 DIAGNOSIS — N186 End stage renal disease: Secondary | ICD-10-CM | POA: Diagnosis not present

## 2016-06-30 DIAGNOSIS — N2581 Secondary hyperparathyroidism of renal origin: Secondary | ICD-10-CM | POA: Diagnosis not present

## 2016-07-02 DIAGNOSIS — N2581 Secondary hyperparathyroidism of renal origin: Secondary | ICD-10-CM | POA: Diagnosis not present

## 2016-07-02 DIAGNOSIS — N186 End stage renal disease: Secondary | ICD-10-CM | POA: Diagnosis not present

## 2016-07-04 DIAGNOSIS — N2581 Secondary hyperparathyroidism of renal origin: Secondary | ICD-10-CM | POA: Diagnosis not present

## 2016-07-04 DIAGNOSIS — N186 End stage renal disease: Secondary | ICD-10-CM | POA: Diagnosis not present

## 2016-07-07 DIAGNOSIS — N186 End stage renal disease: Secondary | ICD-10-CM | POA: Diagnosis not present

## 2016-07-07 DIAGNOSIS — N2581 Secondary hyperparathyroidism of renal origin: Secondary | ICD-10-CM | POA: Diagnosis not present

## 2016-07-09 DIAGNOSIS — N186 End stage renal disease: Secondary | ICD-10-CM | POA: Diagnosis not present

## 2016-07-09 DIAGNOSIS — N2581 Secondary hyperparathyroidism of renal origin: Secondary | ICD-10-CM | POA: Diagnosis not present

## 2016-07-11 DIAGNOSIS — N2581 Secondary hyperparathyroidism of renal origin: Secondary | ICD-10-CM | POA: Diagnosis not present

## 2016-07-11 DIAGNOSIS — N186 End stage renal disease: Secondary | ICD-10-CM | POA: Diagnosis not present

## 2016-07-13 DIAGNOSIS — Z992 Dependence on renal dialysis: Secondary | ICD-10-CM | POA: Diagnosis not present

## 2016-07-13 DIAGNOSIS — N139 Obstructive and reflux uropathy, unspecified: Secondary | ICD-10-CM | POA: Diagnosis not present

## 2016-07-13 DIAGNOSIS — N186 End stage renal disease: Secondary | ICD-10-CM | POA: Diagnosis not present

## 2016-07-14 DIAGNOSIS — N186 End stage renal disease: Secondary | ICD-10-CM | POA: Diagnosis not present

## 2016-07-14 DIAGNOSIS — N2581 Secondary hyperparathyroidism of renal origin: Secondary | ICD-10-CM | POA: Diagnosis not present

## 2016-07-16 DIAGNOSIS — N186 End stage renal disease: Secondary | ICD-10-CM | POA: Diagnosis not present

## 2016-07-16 DIAGNOSIS — N2581 Secondary hyperparathyroidism of renal origin: Secondary | ICD-10-CM | POA: Diagnosis not present

## 2016-07-18 DIAGNOSIS — N186 End stage renal disease: Secondary | ICD-10-CM | POA: Diagnosis not present

## 2016-07-18 DIAGNOSIS — N2581 Secondary hyperparathyroidism of renal origin: Secondary | ICD-10-CM | POA: Diagnosis not present

## 2016-07-21 DIAGNOSIS — N186 End stage renal disease: Secondary | ICD-10-CM | POA: Diagnosis not present

## 2016-07-21 DIAGNOSIS — N2581 Secondary hyperparathyroidism of renal origin: Secondary | ICD-10-CM | POA: Diagnosis not present

## 2016-07-23 DIAGNOSIS — N186 End stage renal disease: Secondary | ICD-10-CM | POA: Diagnosis not present

## 2016-07-23 DIAGNOSIS — N2581 Secondary hyperparathyroidism of renal origin: Secondary | ICD-10-CM | POA: Diagnosis not present

## 2016-07-25 DIAGNOSIS — N186 End stage renal disease: Secondary | ICD-10-CM | POA: Diagnosis not present

## 2016-07-25 DIAGNOSIS — N2581 Secondary hyperparathyroidism of renal origin: Secondary | ICD-10-CM | POA: Diagnosis not present

## 2016-07-28 DIAGNOSIS — N2581 Secondary hyperparathyroidism of renal origin: Secondary | ICD-10-CM | POA: Diagnosis not present

## 2016-07-28 DIAGNOSIS — N186 End stage renal disease: Secondary | ICD-10-CM | POA: Diagnosis not present

## 2016-07-30 DIAGNOSIS — N2581 Secondary hyperparathyroidism of renal origin: Secondary | ICD-10-CM | POA: Diagnosis not present

## 2016-07-30 DIAGNOSIS — N186 End stage renal disease: Secondary | ICD-10-CM | POA: Diagnosis not present

## 2016-08-01 DIAGNOSIS — N2581 Secondary hyperparathyroidism of renal origin: Secondary | ICD-10-CM | POA: Diagnosis not present

## 2016-08-01 DIAGNOSIS — N186 End stage renal disease: Secondary | ICD-10-CM | POA: Diagnosis not present

## 2016-08-04 DIAGNOSIS — N2581 Secondary hyperparathyroidism of renal origin: Secondary | ICD-10-CM | POA: Diagnosis not present

## 2016-08-04 DIAGNOSIS — N186 End stage renal disease: Secondary | ICD-10-CM | POA: Diagnosis not present

## 2016-08-06 DIAGNOSIS — N2581 Secondary hyperparathyroidism of renal origin: Secondary | ICD-10-CM | POA: Diagnosis not present

## 2016-08-06 DIAGNOSIS — N186 End stage renal disease: Secondary | ICD-10-CM | POA: Diagnosis not present

## 2016-08-08 DIAGNOSIS — N2581 Secondary hyperparathyroidism of renal origin: Secondary | ICD-10-CM | POA: Diagnosis not present

## 2016-08-08 DIAGNOSIS — N186 End stage renal disease: Secondary | ICD-10-CM | POA: Diagnosis not present

## 2016-08-11 DIAGNOSIS — N2581 Secondary hyperparathyroidism of renal origin: Secondary | ICD-10-CM | POA: Diagnosis not present

## 2016-08-11 DIAGNOSIS — N186 End stage renal disease: Secondary | ICD-10-CM | POA: Diagnosis not present

## 2016-08-13 DIAGNOSIS — N139 Obstructive and reflux uropathy, unspecified: Secondary | ICD-10-CM | POA: Diagnosis not present

## 2016-08-13 DIAGNOSIS — N186 End stage renal disease: Secondary | ICD-10-CM | POA: Diagnosis not present

## 2016-08-13 DIAGNOSIS — N2581 Secondary hyperparathyroidism of renal origin: Secondary | ICD-10-CM | POA: Diagnosis not present

## 2016-08-13 DIAGNOSIS — Z992 Dependence on renal dialysis: Secondary | ICD-10-CM | POA: Diagnosis not present

## 2016-08-15 DIAGNOSIS — N2581 Secondary hyperparathyroidism of renal origin: Secondary | ICD-10-CM | POA: Diagnosis not present

## 2016-08-15 DIAGNOSIS — N186 End stage renal disease: Secondary | ICD-10-CM | POA: Diagnosis not present

## 2016-08-18 DIAGNOSIS — N186 End stage renal disease: Secondary | ICD-10-CM | POA: Diagnosis not present

## 2016-08-18 DIAGNOSIS — N2581 Secondary hyperparathyroidism of renal origin: Secondary | ICD-10-CM | POA: Diagnosis not present

## 2016-08-20 DIAGNOSIS — N2581 Secondary hyperparathyroidism of renal origin: Secondary | ICD-10-CM | POA: Diagnosis not present

## 2016-08-20 DIAGNOSIS — N186 End stage renal disease: Secondary | ICD-10-CM | POA: Diagnosis not present

## 2016-08-22 DIAGNOSIS — N2581 Secondary hyperparathyroidism of renal origin: Secondary | ICD-10-CM | POA: Diagnosis not present

## 2016-08-22 DIAGNOSIS — N186 End stage renal disease: Secondary | ICD-10-CM | POA: Diagnosis not present

## 2016-08-25 DIAGNOSIS — N2581 Secondary hyperparathyroidism of renal origin: Secondary | ICD-10-CM | POA: Diagnosis not present

## 2016-08-25 DIAGNOSIS — N186 End stage renal disease: Secondary | ICD-10-CM | POA: Diagnosis not present

## 2016-08-27 DIAGNOSIS — N2581 Secondary hyperparathyroidism of renal origin: Secondary | ICD-10-CM | POA: Diagnosis not present

## 2016-08-27 DIAGNOSIS — N186 End stage renal disease: Secondary | ICD-10-CM | POA: Diagnosis not present

## 2016-08-29 DIAGNOSIS — N2581 Secondary hyperparathyroidism of renal origin: Secondary | ICD-10-CM | POA: Diagnosis not present

## 2016-08-29 DIAGNOSIS — N186 End stage renal disease: Secondary | ICD-10-CM | POA: Diagnosis not present

## 2016-09-01 DIAGNOSIS — N186 End stage renal disease: Secondary | ICD-10-CM | POA: Diagnosis not present

## 2016-09-01 DIAGNOSIS — N2581 Secondary hyperparathyroidism of renal origin: Secondary | ICD-10-CM | POA: Diagnosis not present

## 2016-09-03 DIAGNOSIS — N2581 Secondary hyperparathyroidism of renal origin: Secondary | ICD-10-CM | POA: Diagnosis not present

## 2016-09-03 DIAGNOSIS — N186 End stage renal disease: Secondary | ICD-10-CM | POA: Diagnosis not present

## 2016-09-05 DIAGNOSIS — N2581 Secondary hyperparathyroidism of renal origin: Secondary | ICD-10-CM | POA: Diagnosis not present

## 2016-09-05 DIAGNOSIS — N186 End stage renal disease: Secondary | ICD-10-CM | POA: Diagnosis not present

## 2016-09-08 DIAGNOSIS — N186 End stage renal disease: Secondary | ICD-10-CM | POA: Diagnosis not present

## 2016-09-08 DIAGNOSIS — N2581 Secondary hyperparathyroidism of renal origin: Secondary | ICD-10-CM | POA: Diagnosis not present

## 2016-09-10 DIAGNOSIS — N2581 Secondary hyperparathyroidism of renal origin: Secondary | ICD-10-CM | POA: Diagnosis not present

## 2016-09-10 DIAGNOSIS — N186 End stage renal disease: Secondary | ICD-10-CM | POA: Diagnosis not present

## 2016-09-12 DIAGNOSIS — N2581 Secondary hyperparathyroidism of renal origin: Secondary | ICD-10-CM | POA: Diagnosis not present

## 2016-09-12 DIAGNOSIS — Z992 Dependence on renal dialysis: Secondary | ICD-10-CM | POA: Diagnosis not present

## 2016-09-12 DIAGNOSIS — N139 Obstructive and reflux uropathy, unspecified: Secondary | ICD-10-CM | POA: Diagnosis not present

## 2016-09-12 DIAGNOSIS — N186 End stage renal disease: Secondary | ICD-10-CM | POA: Diagnosis not present

## 2016-09-14 DIAGNOSIS — D509 Iron deficiency anemia, unspecified: Secondary | ICD-10-CM | POA: Diagnosis not present

## 2016-09-14 DIAGNOSIS — N2581 Secondary hyperparathyroidism of renal origin: Secondary | ICD-10-CM | POA: Diagnosis not present

## 2016-09-14 DIAGNOSIS — N186 End stage renal disease: Secondary | ICD-10-CM | POA: Diagnosis not present

## 2016-09-15 DIAGNOSIS — N186 End stage renal disease: Secondary | ICD-10-CM | POA: Diagnosis not present

## 2016-09-15 DIAGNOSIS — D509 Iron deficiency anemia, unspecified: Secondary | ICD-10-CM | POA: Diagnosis not present

## 2016-09-15 DIAGNOSIS — N2581 Secondary hyperparathyroidism of renal origin: Secondary | ICD-10-CM | POA: Diagnosis not present

## 2016-09-17 DIAGNOSIS — N186 End stage renal disease: Secondary | ICD-10-CM | POA: Diagnosis not present

## 2016-09-17 DIAGNOSIS — D509 Iron deficiency anemia, unspecified: Secondary | ICD-10-CM | POA: Diagnosis not present

## 2016-09-17 DIAGNOSIS — N2581 Secondary hyperparathyroidism of renal origin: Secondary | ICD-10-CM | POA: Diagnosis not present

## 2016-09-19 DIAGNOSIS — N186 End stage renal disease: Secondary | ICD-10-CM | POA: Diagnosis not present

## 2016-09-19 DIAGNOSIS — D509 Iron deficiency anemia, unspecified: Secondary | ICD-10-CM | POA: Diagnosis not present

## 2016-09-19 DIAGNOSIS — N2581 Secondary hyperparathyroidism of renal origin: Secondary | ICD-10-CM | POA: Diagnosis not present

## 2016-09-22 DIAGNOSIS — N2581 Secondary hyperparathyroidism of renal origin: Secondary | ICD-10-CM | POA: Diagnosis not present

## 2016-09-22 DIAGNOSIS — N186 End stage renal disease: Secondary | ICD-10-CM | POA: Diagnosis not present

## 2016-09-22 DIAGNOSIS — D509 Iron deficiency anemia, unspecified: Secondary | ICD-10-CM | POA: Diagnosis not present

## 2016-09-24 DIAGNOSIS — D509 Iron deficiency anemia, unspecified: Secondary | ICD-10-CM | POA: Diagnosis not present

## 2016-09-24 DIAGNOSIS — N2581 Secondary hyperparathyroidism of renal origin: Secondary | ICD-10-CM | POA: Diagnosis not present

## 2016-09-24 DIAGNOSIS — N186 End stage renal disease: Secondary | ICD-10-CM | POA: Diagnosis not present

## 2016-09-26 DIAGNOSIS — D509 Iron deficiency anemia, unspecified: Secondary | ICD-10-CM | POA: Diagnosis not present

## 2016-09-26 DIAGNOSIS — N186 End stage renal disease: Secondary | ICD-10-CM | POA: Diagnosis not present

## 2016-09-26 DIAGNOSIS — N2581 Secondary hyperparathyroidism of renal origin: Secondary | ICD-10-CM | POA: Diagnosis not present

## 2016-09-29 DIAGNOSIS — D509 Iron deficiency anemia, unspecified: Secondary | ICD-10-CM | POA: Diagnosis not present

## 2016-09-29 DIAGNOSIS — N186 End stage renal disease: Secondary | ICD-10-CM | POA: Diagnosis not present

## 2016-09-29 DIAGNOSIS — N2581 Secondary hyperparathyroidism of renal origin: Secondary | ICD-10-CM | POA: Diagnosis not present

## 2016-10-01 DIAGNOSIS — D509 Iron deficiency anemia, unspecified: Secondary | ICD-10-CM | POA: Diagnosis not present

## 2016-10-01 DIAGNOSIS — N2581 Secondary hyperparathyroidism of renal origin: Secondary | ICD-10-CM | POA: Diagnosis not present

## 2016-10-01 DIAGNOSIS — N186 End stage renal disease: Secondary | ICD-10-CM | POA: Diagnosis not present

## 2016-10-03 DIAGNOSIS — N2581 Secondary hyperparathyroidism of renal origin: Secondary | ICD-10-CM | POA: Diagnosis not present

## 2016-10-03 DIAGNOSIS — N186 End stage renal disease: Secondary | ICD-10-CM | POA: Diagnosis not present

## 2016-10-03 DIAGNOSIS — D509 Iron deficiency anemia, unspecified: Secondary | ICD-10-CM | POA: Diagnosis not present

## 2016-10-06 DIAGNOSIS — N186 End stage renal disease: Secondary | ICD-10-CM | POA: Diagnosis not present

## 2016-10-06 DIAGNOSIS — N2581 Secondary hyperparathyroidism of renal origin: Secondary | ICD-10-CM | POA: Diagnosis not present

## 2016-10-06 DIAGNOSIS — D509 Iron deficiency anemia, unspecified: Secondary | ICD-10-CM | POA: Diagnosis not present

## 2016-10-08 DIAGNOSIS — N2581 Secondary hyperparathyroidism of renal origin: Secondary | ICD-10-CM | POA: Diagnosis not present

## 2016-10-08 DIAGNOSIS — N186 End stage renal disease: Secondary | ICD-10-CM | POA: Diagnosis not present

## 2016-10-08 DIAGNOSIS — D509 Iron deficiency anemia, unspecified: Secondary | ICD-10-CM | POA: Diagnosis not present

## 2016-10-10 DIAGNOSIS — D509 Iron deficiency anemia, unspecified: Secondary | ICD-10-CM | POA: Diagnosis not present

## 2016-10-10 DIAGNOSIS — N186 End stage renal disease: Secondary | ICD-10-CM | POA: Diagnosis not present

## 2016-10-10 DIAGNOSIS — N2581 Secondary hyperparathyroidism of renal origin: Secondary | ICD-10-CM | POA: Diagnosis not present

## 2016-10-13 DIAGNOSIS — Z992 Dependence on renal dialysis: Secondary | ICD-10-CM | POA: Diagnosis not present

## 2016-10-13 DIAGNOSIS — N186 End stage renal disease: Secondary | ICD-10-CM | POA: Diagnosis not present

## 2016-10-13 DIAGNOSIS — N139 Obstructive and reflux uropathy, unspecified: Secondary | ICD-10-CM | POA: Diagnosis not present

## 2016-10-13 DIAGNOSIS — N2581 Secondary hyperparathyroidism of renal origin: Secondary | ICD-10-CM | POA: Diagnosis not present

## 2016-10-13 DIAGNOSIS — D509 Iron deficiency anemia, unspecified: Secondary | ICD-10-CM | POA: Diagnosis not present

## 2016-10-15 DIAGNOSIS — N2581 Secondary hyperparathyroidism of renal origin: Secondary | ICD-10-CM | POA: Diagnosis not present

## 2016-10-15 DIAGNOSIS — N186 End stage renal disease: Secondary | ICD-10-CM | POA: Diagnosis not present

## 2016-10-17 DIAGNOSIS — N2581 Secondary hyperparathyroidism of renal origin: Secondary | ICD-10-CM | POA: Diagnosis not present

## 2016-10-17 DIAGNOSIS — N186 End stage renal disease: Secondary | ICD-10-CM | POA: Diagnosis not present

## 2016-10-20 DIAGNOSIS — N186 End stage renal disease: Secondary | ICD-10-CM | POA: Diagnosis not present

## 2016-10-20 DIAGNOSIS — N2581 Secondary hyperparathyroidism of renal origin: Secondary | ICD-10-CM | POA: Diagnosis not present

## 2016-10-22 DIAGNOSIS — N2581 Secondary hyperparathyroidism of renal origin: Secondary | ICD-10-CM | POA: Diagnosis not present

## 2016-10-22 DIAGNOSIS — N186 End stage renal disease: Secondary | ICD-10-CM | POA: Diagnosis not present

## 2016-10-24 DIAGNOSIS — N186 End stage renal disease: Secondary | ICD-10-CM | POA: Diagnosis not present

## 2016-10-24 DIAGNOSIS — N2581 Secondary hyperparathyroidism of renal origin: Secondary | ICD-10-CM | POA: Diagnosis not present

## 2016-10-27 DIAGNOSIS — N186 End stage renal disease: Secondary | ICD-10-CM | POA: Diagnosis not present

## 2016-10-27 DIAGNOSIS — N2581 Secondary hyperparathyroidism of renal origin: Secondary | ICD-10-CM | POA: Diagnosis not present

## 2016-10-29 DIAGNOSIS — N2581 Secondary hyperparathyroidism of renal origin: Secondary | ICD-10-CM | POA: Diagnosis not present

## 2016-10-29 DIAGNOSIS — N186 End stage renal disease: Secondary | ICD-10-CM | POA: Diagnosis not present

## 2016-10-31 DIAGNOSIS — N2581 Secondary hyperparathyroidism of renal origin: Secondary | ICD-10-CM | POA: Diagnosis not present

## 2016-10-31 DIAGNOSIS — N186 End stage renal disease: Secondary | ICD-10-CM | POA: Diagnosis not present

## 2016-11-03 DIAGNOSIS — N186 End stage renal disease: Secondary | ICD-10-CM | POA: Diagnosis not present

## 2016-11-03 DIAGNOSIS — N2581 Secondary hyperparathyroidism of renal origin: Secondary | ICD-10-CM | POA: Diagnosis not present

## 2016-11-05 DIAGNOSIS — N2581 Secondary hyperparathyroidism of renal origin: Secondary | ICD-10-CM | POA: Diagnosis not present

## 2016-11-05 DIAGNOSIS — N186 End stage renal disease: Secondary | ICD-10-CM | POA: Diagnosis not present

## 2016-11-07 DIAGNOSIS — N186 End stage renal disease: Secondary | ICD-10-CM | POA: Diagnosis not present

## 2016-11-07 DIAGNOSIS — N2581 Secondary hyperparathyroidism of renal origin: Secondary | ICD-10-CM | POA: Diagnosis not present

## 2016-11-10 DIAGNOSIS — N2581 Secondary hyperparathyroidism of renal origin: Secondary | ICD-10-CM | POA: Diagnosis not present

## 2016-11-10 DIAGNOSIS — N186 End stage renal disease: Secondary | ICD-10-CM | POA: Diagnosis not present

## 2016-11-12 DIAGNOSIS — N2581 Secondary hyperparathyroidism of renal origin: Secondary | ICD-10-CM | POA: Diagnosis not present

## 2016-11-12 DIAGNOSIS — N186 End stage renal disease: Secondary | ICD-10-CM | POA: Diagnosis not present

## 2016-11-13 DIAGNOSIS — Z992 Dependence on renal dialysis: Secondary | ICD-10-CM | POA: Diagnosis not present

## 2016-11-13 DIAGNOSIS — N186 End stage renal disease: Secondary | ICD-10-CM | POA: Diagnosis not present

## 2016-11-13 DIAGNOSIS — N139 Obstructive and reflux uropathy, unspecified: Secondary | ICD-10-CM | POA: Diagnosis not present

## 2016-11-14 DIAGNOSIS — Z23 Encounter for immunization: Secondary | ICD-10-CM | POA: Diagnosis not present

## 2016-11-14 DIAGNOSIS — N2581 Secondary hyperparathyroidism of renal origin: Secondary | ICD-10-CM | POA: Diagnosis not present

## 2016-11-14 DIAGNOSIS — N186 End stage renal disease: Secondary | ICD-10-CM | POA: Diagnosis not present

## 2016-11-17 DIAGNOSIS — N2581 Secondary hyperparathyroidism of renal origin: Secondary | ICD-10-CM | POA: Diagnosis not present

## 2016-11-17 DIAGNOSIS — N186 End stage renal disease: Secondary | ICD-10-CM | POA: Diagnosis not present

## 2016-11-17 DIAGNOSIS — Z23 Encounter for immunization: Secondary | ICD-10-CM | POA: Diagnosis not present

## 2016-11-19 DIAGNOSIS — N2581 Secondary hyperparathyroidism of renal origin: Secondary | ICD-10-CM | POA: Diagnosis not present

## 2016-11-19 DIAGNOSIS — N186 End stage renal disease: Secondary | ICD-10-CM | POA: Diagnosis not present

## 2016-11-19 DIAGNOSIS — Z23 Encounter for immunization: Secondary | ICD-10-CM | POA: Diagnosis not present

## 2016-11-21 DIAGNOSIS — Z23 Encounter for immunization: Secondary | ICD-10-CM | POA: Diagnosis not present

## 2016-11-21 DIAGNOSIS — N2581 Secondary hyperparathyroidism of renal origin: Secondary | ICD-10-CM | POA: Diagnosis not present

## 2016-11-21 DIAGNOSIS — N186 End stage renal disease: Secondary | ICD-10-CM | POA: Diagnosis not present

## 2016-11-24 DIAGNOSIS — N186 End stage renal disease: Secondary | ICD-10-CM | POA: Diagnosis not present

## 2016-11-24 DIAGNOSIS — N2581 Secondary hyperparathyroidism of renal origin: Secondary | ICD-10-CM | POA: Diagnosis not present

## 2016-11-24 DIAGNOSIS — Z23 Encounter for immunization: Secondary | ICD-10-CM | POA: Diagnosis not present

## 2016-11-26 DIAGNOSIS — Z23 Encounter for immunization: Secondary | ICD-10-CM | POA: Diagnosis not present

## 2016-11-26 DIAGNOSIS — N186 End stage renal disease: Secondary | ICD-10-CM | POA: Diagnosis not present

## 2016-11-26 DIAGNOSIS — N2581 Secondary hyperparathyroidism of renal origin: Secondary | ICD-10-CM | POA: Diagnosis not present

## 2016-11-28 DIAGNOSIS — N2581 Secondary hyperparathyroidism of renal origin: Secondary | ICD-10-CM | POA: Diagnosis not present

## 2016-11-28 DIAGNOSIS — N186 End stage renal disease: Secondary | ICD-10-CM | POA: Diagnosis not present

## 2016-11-28 DIAGNOSIS — Z23 Encounter for immunization: Secondary | ICD-10-CM | POA: Diagnosis not present

## 2016-12-01 DIAGNOSIS — Z23 Encounter for immunization: Secondary | ICD-10-CM | POA: Diagnosis not present

## 2016-12-01 DIAGNOSIS — N186 End stage renal disease: Secondary | ICD-10-CM | POA: Diagnosis not present

## 2016-12-01 DIAGNOSIS — N2581 Secondary hyperparathyroidism of renal origin: Secondary | ICD-10-CM | POA: Diagnosis not present

## 2016-12-03 DIAGNOSIS — N2581 Secondary hyperparathyroidism of renal origin: Secondary | ICD-10-CM | POA: Diagnosis not present

## 2016-12-03 DIAGNOSIS — Z23 Encounter for immunization: Secondary | ICD-10-CM | POA: Diagnosis not present

## 2016-12-03 DIAGNOSIS — N186 End stage renal disease: Secondary | ICD-10-CM | POA: Diagnosis not present

## 2016-12-05 DIAGNOSIS — N186 End stage renal disease: Secondary | ICD-10-CM | POA: Diagnosis not present

## 2016-12-05 DIAGNOSIS — N2581 Secondary hyperparathyroidism of renal origin: Secondary | ICD-10-CM | POA: Diagnosis not present

## 2016-12-05 DIAGNOSIS — Z23 Encounter for immunization: Secondary | ICD-10-CM | POA: Diagnosis not present

## 2016-12-08 DIAGNOSIS — Z23 Encounter for immunization: Secondary | ICD-10-CM | POA: Diagnosis not present

## 2016-12-08 DIAGNOSIS — N2581 Secondary hyperparathyroidism of renal origin: Secondary | ICD-10-CM | POA: Diagnosis not present

## 2016-12-08 DIAGNOSIS — N186 End stage renal disease: Secondary | ICD-10-CM | POA: Diagnosis not present

## 2016-12-10 DIAGNOSIS — N186 End stage renal disease: Secondary | ICD-10-CM | POA: Diagnosis not present

## 2016-12-10 DIAGNOSIS — N2581 Secondary hyperparathyroidism of renal origin: Secondary | ICD-10-CM | POA: Diagnosis not present

## 2016-12-10 DIAGNOSIS — Z23 Encounter for immunization: Secondary | ICD-10-CM | POA: Diagnosis not present

## 2016-12-12 DIAGNOSIS — N2581 Secondary hyperparathyroidism of renal origin: Secondary | ICD-10-CM | POA: Diagnosis not present

## 2016-12-12 DIAGNOSIS — N186 End stage renal disease: Secondary | ICD-10-CM | POA: Diagnosis not present

## 2016-12-12 DIAGNOSIS — Z23 Encounter for immunization: Secondary | ICD-10-CM | POA: Diagnosis not present

## 2016-12-13 DIAGNOSIS — N139 Obstructive and reflux uropathy, unspecified: Secondary | ICD-10-CM | POA: Diagnosis not present

## 2016-12-13 DIAGNOSIS — N186 End stage renal disease: Secondary | ICD-10-CM | POA: Diagnosis not present

## 2016-12-13 DIAGNOSIS — Z992 Dependence on renal dialysis: Secondary | ICD-10-CM | POA: Diagnosis not present

## 2016-12-15 DIAGNOSIS — N186 End stage renal disease: Secondary | ICD-10-CM | POA: Diagnosis not present

## 2016-12-15 DIAGNOSIS — N2581 Secondary hyperparathyroidism of renal origin: Secondary | ICD-10-CM | POA: Diagnosis not present

## 2016-12-17 DIAGNOSIS — N186 End stage renal disease: Secondary | ICD-10-CM | POA: Diagnosis not present

## 2016-12-17 DIAGNOSIS — N2581 Secondary hyperparathyroidism of renal origin: Secondary | ICD-10-CM | POA: Diagnosis not present

## 2016-12-19 DIAGNOSIS — N2581 Secondary hyperparathyroidism of renal origin: Secondary | ICD-10-CM | POA: Diagnosis not present

## 2016-12-19 DIAGNOSIS — N186 End stage renal disease: Secondary | ICD-10-CM | POA: Diagnosis not present

## 2016-12-22 DIAGNOSIS — N186 End stage renal disease: Secondary | ICD-10-CM | POA: Diagnosis not present

## 2016-12-22 DIAGNOSIS — N2581 Secondary hyperparathyroidism of renal origin: Secondary | ICD-10-CM | POA: Diagnosis not present

## 2016-12-24 DIAGNOSIS — N2581 Secondary hyperparathyroidism of renal origin: Secondary | ICD-10-CM | POA: Diagnosis not present

## 2016-12-24 DIAGNOSIS — N186 End stage renal disease: Secondary | ICD-10-CM | POA: Diagnosis not present

## 2016-12-26 DIAGNOSIS — N2581 Secondary hyperparathyroidism of renal origin: Secondary | ICD-10-CM | POA: Diagnosis not present

## 2016-12-26 DIAGNOSIS — N186 End stage renal disease: Secondary | ICD-10-CM | POA: Diagnosis not present

## 2016-12-29 DIAGNOSIS — N186 End stage renal disease: Secondary | ICD-10-CM | POA: Diagnosis not present

## 2016-12-29 DIAGNOSIS — N2581 Secondary hyperparathyroidism of renal origin: Secondary | ICD-10-CM | POA: Diagnosis not present

## 2016-12-31 DIAGNOSIS — N186 End stage renal disease: Secondary | ICD-10-CM | POA: Diagnosis not present

## 2016-12-31 DIAGNOSIS — N2581 Secondary hyperparathyroidism of renal origin: Secondary | ICD-10-CM | POA: Diagnosis not present

## 2017-01-02 DIAGNOSIS — N186 End stage renal disease: Secondary | ICD-10-CM | POA: Diagnosis not present

## 2017-01-02 DIAGNOSIS — N2581 Secondary hyperparathyroidism of renal origin: Secondary | ICD-10-CM | POA: Diagnosis not present

## 2017-01-05 DIAGNOSIS — N2581 Secondary hyperparathyroidism of renal origin: Secondary | ICD-10-CM | POA: Diagnosis not present

## 2017-01-05 DIAGNOSIS — N186 End stage renal disease: Secondary | ICD-10-CM | POA: Diagnosis not present

## 2017-01-07 DIAGNOSIS — N2581 Secondary hyperparathyroidism of renal origin: Secondary | ICD-10-CM | POA: Diagnosis not present

## 2017-01-07 DIAGNOSIS — N186 End stage renal disease: Secondary | ICD-10-CM | POA: Diagnosis not present

## 2017-01-09 DIAGNOSIS — N186 End stage renal disease: Secondary | ICD-10-CM | POA: Diagnosis not present

## 2017-01-09 DIAGNOSIS — N2581 Secondary hyperparathyroidism of renal origin: Secondary | ICD-10-CM | POA: Diagnosis not present

## 2017-01-12 DIAGNOSIS — N186 End stage renal disease: Secondary | ICD-10-CM | POA: Diagnosis not present

## 2017-01-12 DIAGNOSIS — N2581 Secondary hyperparathyroidism of renal origin: Secondary | ICD-10-CM | POA: Diagnosis not present

## 2017-01-13 DIAGNOSIS — N139 Obstructive and reflux uropathy, unspecified: Secondary | ICD-10-CM | POA: Diagnosis not present

## 2017-01-13 DIAGNOSIS — Z992 Dependence on renal dialysis: Secondary | ICD-10-CM | POA: Diagnosis not present

## 2017-01-13 DIAGNOSIS — N186 End stage renal disease: Secondary | ICD-10-CM | POA: Diagnosis not present

## 2017-01-14 DIAGNOSIS — N186 End stage renal disease: Secondary | ICD-10-CM | POA: Diagnosis not present

## 2017-01-14 DIAGNOSIS — N2581 Secondary hyperparathyroidism of renal origin: Secondary | ICD-10-CM | POA: Diagnosis not present

## 2017-01-16 DIAGNOSIS — N186 End stage renal disease: Secondary | ICD-10-CM | POA: Diagnosis not present

## 2017-01-16 DIAGNOSIS — N2581 Secondary hyperparathyroidism of renal origin: Secondary | ICD-10-CM | POA: Diagnosis not present

## 2017-01-19 DIAGNOSIS — N2581 Secondary hyperparathyroidism of renal origin: Secondary | ICD-10-CM | POA: Diagnosis not present

## 2017-01-19 DIAGNOSIS — N186 End stage renal disease: Secondary | ICD-10-CM | POA: Diagnosis not present

## 2017-01-21 DIAGNOSIS — N186 End stage renal disease: Secondary | ICD-10-CM | POA: Diagnosis not present

## 2017-01-21 DIAGNOSIS — N2581 Secondary hyperparathyroidism of renal origin: Secondary | ICD-10-CM | POA: Diagnosis not present

## 2017-01-23 DIAGNOSIS — N2581 Secondary hyperparathyroidism of renal origin: Secondary | ICD-10-CM | POA: Diagnosis not present

## 2017-01-23 DIAGNOSIS — N186 End stage renal disease: Secondary | ICD-10-CM | POA: Diagnosis not present

## 2017-01-26 DIAGNOSIS — N186 End stage renal disease: Secondary | ICD-10-CM | POA: Diagnosis not present

## 2017-01-26 DIAGNOSIS — N2581 Secondary hyperparathyroidism of renal origin: Secondary | ICD-10-CM | POA: Diagnosis not present

## 2017-01-28 DIAGNOSIS — N186 End stage renal disease: Secondary | ICD-10-CM | POA: Diagnosis not present

## 2017-01-28 DIAGNOSIS — N2581 Secondary hyperparathyroidism of renal origin: Secondary | ICD-10-CM | POA: Diagnosis not present

## 2017-01-30 DIAGNOSIS — N186 End stage renal disease: Secondary | ICD-10-CM | POA: Diagnosis not present

## 2017-01-30 DIAGNOSIS — N2581 Secondary hyperparathyroidism of renal origin: Secondary | ICD-10-CM | POA: Diagnosis not present

## 2017-02-01 DIAGNOSIS — N2581 Secondary hyperparathyroidism of renal origin: Secondary | ICD-10-CM | POA: Diagnosis not present

## 2017-02-01 DIAGNOSIS — N186 End stage renal disease: Secondary | ICD-10-CM | POA: Diagnosis not present

## 2017-02-03 DIAGNOSIS — N186 End stage renal disease: Secondary | ICD-10-CM | POA: Diagnosis not present

## 2017-02-03 DIAGNOSIS — N2581 Secondary hyperparathyroidism of renal origin: Secondary | ICD-10-CM | POA: Diagnosis not present

## 2017-02-06 DIAGNOSIS — N2581 Secondary hyperparathyroidism of renal origin: Secondary | ICD-10-CM | POA: Diagnosis not present

## 2017-02-06 DIAGNOSIS — N186 End stage renal disease: Secondary | ICD-10-CM | POA: Diagnosis not present

## 2017-02-09 DIAGNOSIS — N2581 Secondary hyperparathyroidism of renal origin: Secondary | ICD-10-CM | POA: Diagnosis not present

## 2017-02-09 DIAGNOSIS — N186 End stage renal disease: Secondary | ICD-10-CM | POA: Diagnosis not present

## 2017-02-11 DIAGNOSIS — N186 End stage renal disease: Secondary | ICD-10-CM | POA: Diagnosis not present

## 2017-02-11 DIAGNOSIS — N2581 Secondary hyperparathyroidism of renal origin: Secondary | ICD-10-CM | POA: Diagnosis not present

## 2017-02-12 DIAGNOSIS — N139 Obstructive and reflux uropathy, unspecified: Secondary | ICD-10-CM | POA: Diagnosis not present

## 2017-02-12 DIAGNOSIS — Z992 Dependence on renal dialysis: Secondary | ICD-10-CM | POA: Diagnosis not present

## 2017-02-12 DIAGNOSIS — N186 End stage renal disease: Secondary | ICD-10-CM | POA: Diagnosis not present

## 2017-02-13 DIAGNOSIS — N2581 Secondary hyperparathyroidism of renal origin: Secondary | ICD-10-CM | POA: Diagnosis not present

## 2017-02-13 DIAGNOSIS — N186 End stage renal disease: Secondary | ICD-10-CM | POA: Diagnosis not present

## 2017-02-16 DIAGNOSIS — N186 End stage renal disease: Secondary | ICD-10-CM | POA: Diagnosis not present

## 2017-02-16 DIAGNOSIS — N2581 Secondary hyperparathyroidism of renal origin: Secondary | ICD-10-CM | POA: Diagnosis not present

## 2017-02-18 DIAGNOSIS — N186 End stage renal disease: Secondary | ICD-10-CM | POA: Diagnosis not present

## 2017-02-18 DIAGNOSIS — N2581 Secondary hyperparathyroidism of renal origin: Secondary | ICD-10-CM | POA: Diagnosis not present

## 2017-02-20 DIAGNOSIS — N186 End stage renal disease: Secondary | ICD-10-CM | POA: Diagnosis not present

## 2017-02-20 DIAGNOSIS — N2581 Secondary hyperparathyroidism of renal origin: Secondary | ICD-10-CM | POA: Diagnosis not present

## 2017-02-25 DIAGNOSIS — N186 End stage renal disease: Secondary | ICD-10-CM | POA: Diagnosis not present

## 2017-02-25 DIAGNOSIS — N2581 Secondary hyperparathyroidism of renal origin: Secondary | ICD-10-CM | POA: Diagnosis not present

## 2017-02-27 DIAGNOSIS — N186 End stage renal disease: Secondary | ICD-10-CM | POA: Diagnosis not present

## 2017-02-27 DIAGNOSIS — N2581 Secondary hyperparathyroidism of renal origin: Secondary | ICD-10-CM | POA: Diagnosis not present

## 2017-03-02 DIAGNOSIS — N186 End stage renal disease: Secondary | ICD-10-CM | POA: Diagnosis not present

## 2017-03-02 DIAGNOSIS — N2581 Secondary hyperparathyroidism of renal origin: Secondary | ICD-10-CM | POA: Diagnosis not present

## 2017-03-04 DIAGNOSIS — N2581 Secondary hyperparathyroidism of renal origin: Secondary | ICD-10-CM | POA: Diagnosis not present

## 2017-03-04 DIAGNOSIS — N186 End stage renal disease: Secondary | ICD-10-CM | POA: Diagnosis not present

## 2017-03-06 DIAGNOSIS — N186 End stage renal disease: Secondary | ICD-10-CM | POA: Diagnosis not present

## 2017-03-06 DIAGNOSIS — N2581 Secondary hyperparathyroidism of renal origin: Secondary | ICD-10-CM | POA: Diagnosis not present

## 2017-03-08 DIAGNOSIS — N186 End stage renal disease: Secondary | ICD-10-CM | POA: Diagnosis not present

## 2017-03-08 DIAGNOSIS — N2581 Secondary hyperparathyroidism of renal origin: Secondary | ICD-10-CM | POA: Diagnosis not present

## 2017-03-11 DIAGNOSIS — N186 End stage renal disease: Secondary | ICD-10-CM | POA: Diagnosis not present

## 2017-03-11 DIAGNOSIS — N2581 Secondary hyperparathyroidism of renal origin: Secondary | ICD-10-CM | POA: Diagnosis not present

## 2017-03-13 DIAGNOSIS — N186 End stage renal disease: Secondary | ICD-10-CM | POA: Diagnosis not present

## 2017-03-13 DIAGNOSIS — N2581 Secondary hyperparathyroidism of renal origin: Secondary | ICD-10-CM | POA: Diagnosis not present

## 2017-03-15 DIAGNOSIS — N2581 Secondary hyperparathyroidism of renal origin: Secondary | ICD-10-CM | POA: Diagnosis not present

## 2017-03-15 DIAGNOSIS — N186 End stage renal disease: Secondary | ICD-10-CM | POA: Diagnosis not present

## 2017-03-15 DIAGNOSIS — Z992 Dependence on renal dialysis: Secondary | ICD-10-CM | POA: Diagnosis not present

## 2017-03-15 DIAGNOSIS — N139 Obstructive and reflux uropathy, unspecified: Secondary | ICD-10-CM | POA: Diagnosis not present

## 2017-03-18 DIAGNOSIS — N186 End stage renal disease: Secondary | ICD-10-CM | POA: Diagnosis not present

## 2017-03-18 DIAGNOSIS — N2581 Secondary hyperparathyroidism of renal origin: Secondary | ICD-10-CM | POA: Diagnosis not present

## 2017-03-20 DIAGNOSIS — N186 End stage renal disease: Secondary | ICD-10-CM | POA: Diagnosis not present

## 2017-03-20 DIAGNOSIS — N2581 Secondary hyperparathyroidism of renal origin: Secondary | ICD-10-CM | POA: Diagnosis not present

## 2017-03-23 DIAGNOSIS — N2581 Secondary hyperparathyroidism of renal origin: Secondary | ICD-10-CM | POA: Diagnosis not present

## 2017-03-23 DIAGNOSIS — N186 End stage renal disease: Secondary | ICD-10-CM | POA: Diagnosis not present

## 2017-03-25 DIAGNOSIS — N2581 Secondary hyperparathyroidism of renal origin: Secondary | ICD-10-CM | POA: Diagnosis not present

## 2017-03-25 DIAGNOSIS — N186 End stage renal disease: Secondary | ICD-10-CM | POA: Diagnosis not present

## 2017-03-27 DIAGNOSIS — N186 End stage renal disease: Secondary | ICD-10-CM | POA: Diagnosis not present

## 2017-03-27 DIAGNOSIS — N2581 Secondary hyperparathyroidism of renal origin: Secondary | ICD-10-CM | POA: Diagnosis not present

## 2017-03-30 DIAGNOSIS — N2581 Secondary hyperparathyroidism of renal origin: Secondary | ICD-10-CM | POA: Diagnosis not present

## 2017-03-30 DIAGNOSIS — N186 End stage renal disease: Secondary | ICD-10-CM | POA: Diagnosis not present

## 2017-04-01 DIAGNOSIS — N2581 Secondary hyperparathyroidism of renal origin: Secondary | ICD-10-CM | POA: Diagnosis not present

## 2017-04-01 DIAGNOSIS — N186 End stage renal disease: Secondary | ICD-10-CM | POA: Diagnosis not present

## 2017-04-03 DIAGNOSIS — N2581 Secondary hyperparathyroidism of renal origin: Secondary | ICD-10-CM | POA: Diagnosis not present

## 2017-04-03 DIAGNOSIS — N186 End stage renal disease: Secondary | ICD-10-CM | POA: Diagnosis not present

## 2017-04-06 DIAGNOSIS — N2581 Secondary hyperparathyroidism of renal origin: Secondary | ICD-10-CM | POA: Diagnosis not present

## 2017-04-06 DIAGNOSIS — N186 End stage renal disease: Secondary | ICD-10-CM | POA: Diagnosis not present

## 2017-04-08 DIAGNOSIS — N186 End stage renal disease: Secondary | ICD-10-CM | POA: Diagnosis not present

## 2017-04-08 DIAGNOSIS — N2581 Secondary hyperparathyroidism of renal origin: Secondary | ICD-10-CM | POA: Diagnosis not present

## 2017-04-09 DIAGNOSIS — T82858A Stenosis of vascular prosthetic devices, implants and grafts, initial encounter: Secondary | ICD-10-CM | POA: Diagnosis not present

## 2017-04-09 DIAGNOSIS — Z992 Dependence on renal dialysis: Secondary | ICD-10-CM | POA: Diagnosis not present

## 2017-04-09 DIAGNOSIS — N186 End stage renal disease: Secondary | ICD-10-CM | POA: Diagnosis not present

## 2017-04-09 DIAGNOSIS — I871 Compression of vein: Secondary | ICD-10-CM | POA: Diagnosis not present

## 2017-04-10 DIAGNOSIS — N2581 Secondary hyperparathyroidism of renal origin: Secondary | ICD-10-CM | POA: Diagnosis not present

## 2017-04-10 DIAGNOSIS — N186 End stage renal disease: Secondary | ICD-10-CM | POA: Diagnosis not present

## 2017-04-13 DIAGNOSIS — N2581 Secondary hyperparathyroidism of renal origin: Secondary | ICD-10-CM | POA: Diagnosis not present

## 2017-04-13 DIAGNOSIS — N186 End stage renal disease: Secondary | ICD-10-CM | POA: Diagnosis not present

## 2017-04-15 DIAGNOSIS — N139 Obstructive and reflux uropathy, unspecified: Secondary | ICD-10-CM | POA: Diagnosis not present

## 2017-04-15 DIAGNOSIS — N186 End stage renal disease: Secondary | ICD-10-CM | POA: Diagnosis not present

## 2017-04-15 DIAGNOSIS — Z992 Dependence on renal dialysis: Secondary | ICD-10-CM | POA: Diagnosis not present

## 2017-04-15 DIAGNOSIS — N2581 Secondary hyperparathyroidism of renal origin: Secondary | ICD-10-CM | POA: Diagnosis not present

## 2017-04-16 DIAGNOSIS — N186 End stage renal disease: Secondary | ICD-10-CM | POA: Diagnosis not present

## 2017-04-16 DIAGNOSIS — N139 Obstructive and reflux uropathy, unspecified: Secondary | ICD-10-CM | POA: Diagnosis not present

## 2017-04-16 DIAGNOSIS — Z992 Dependence on renal dialysis: Secondary | ICD-10-CM | POA: Diagnosis not present

## 2017-04-17 DIAGNOSIS — N2581 Secondary hyperparathyroidism of renal origin: Secondary | ICD-10-CM | POA: Diagnosis not present

## 2017-04-17 DIAGNOSIS — N186 End stage renal disease: Secondary | ICD-10-CM | POA: Diagnosis not present

## 2017-04-20 DIAGNOSIS — N186 End stage renal disease: Secondary | ICD-10-CM | POA: Diagnosis not present

## 2017-04-20 DIAGNOSIS — N2581 Secondary hyperparathyroidism of renal origin: Secondary | ICD-10-CM | POA: Diagnosis not present

## 2017-04-22 DIAGNOSIS — N2581 Secondary hyperparathyroidism of renal origin: Secondary | ICD-10-CM | POA: Diagnosis not present

## 2017-04-22 DIAGNOSIS — N186 End stage renal disease: Secondary | ICD-10-CM | POA: Diagnosis not present

## 2017-04-24 DIAGNOSIS — N2581 Secondary hyperparathyroidism of renal origin: Secondary | ICD-10-CM | POA: Diagnosis not present

## 2017-04-24 DIAGNOSIS — N186 End stage renal disease: Secondary | ICD-10-CM | POA: Diagnosis not present

## 2017-04-27 DIAGNOSIS — N2581 Secondary hyperparathyroidism of renal origin: Secondary | ICD-10-CM | POA: Diagnosis not present

## 2017-04-27 DIAGNOSIS — N186 End stage renal disease: Secondary | ICD-10-CM | POA: Diagnosis not present

## 2017-04-29 DIAGNOSIS — N186 End stage renal disease: Secondary | ICD-10-CM | POA: Diagnosis not present

## 2017-04-29 DIAGNOSIS — N2581 Secondary hyperparathyroidism of renal origin: Secondary | ICD-10-CM | POA: Diagnosis not present

## 2017-05-01 DIAGNOSIS — N2581 Secondary hyperparathyroidism of renal origin: Secondary | ICD-10-CM | POA: Diagnosis not present

## 2017-05-01 DIAGNOSIS — N186 End stage renal disease: Secondary | ICD-10-CM | POA: Diagnosis not present

## 2017-05-04 DIAGNOSIS — N186 End stage renal disease: Secondary | ICD-10-CM | POA: Diagnosis not present

## 2017-05-04 DIAGNOSIS — N2581 Secondary hyperparathyroidism of renal origin: Secondary | ICD-10-CM | POA: Diagnosis not present

## 2017-05-06 DIAGNOSIS — N186 End stage renal disease: Secondary | ICD-10-CM | POA: Diagnosis not present

## 2017-05-06 DIAGNOSIS — N2581 Secondary hyperparathyroidism of renal origin: Secondary | ICD-10-CM | POA: Diagnosis not present

## 2017-05-08 DIAGNOSIS — N2581 Secondary hyperparathyroidism of renal origin: Secondary | ICD-10-CM | POA: Diagnosis not present

## 2017-05-08 DIAGNOSIS — N186 End stage renal disease: Secondary | ICD-10-CM | POA: Diagnosis not present

## 2017-05-11 DIAGNOSIS — N186 End stage renal disease: Secondary | ICD-10-CM | POA: Diagnosis not present

## 2017-05-11 DIAGNOSIS — N2581 Secondary hyperparathyroidism of renal origin: Secondary | ICD-10-CM | POA: Diagnosis not present

## 2017-05-13 DIAGNOSIS — N2581 Secondary hyperparathyroidism of renal origin: Secondary | ICD-10-CM | POA: Diagnosis not present

## 2017-05-13 DIAGNOSIS — N186 End stage renal disease: Secondary | ICD-10-CM | POA: Diagnosis not present

## 2017-05-14 DIAGNOSIS — Z992 Dependence on renal dialysis: Secondary | ICD-10-CM | POA: Diagnosis not present

## 2017-05-14 DIAGNOSIS — N186 End stage renal disease: Secondary | ICD-10-CM | POA: Diagnosis not present

## 2017-05-14 DIAGNOSIS — N139 Obstructive and reflux uropathy, unspecified: Secondary | ICD-10-CM | POA: Diagnosis not present

## 2017-05-15 DIAGNOSIS — N186 End stage renal disease: Secondary | ICD-10-CM | POA: Diagnosis not present

## 2017-05-15 DIAGNOSIS — N2581 Secondary hyperparathyroidism of renal origin: Secondary | ICD-10-CM | POA: Diagnosis not present

## 2017-05-18 DIAGNOSIS — N2581 Secondary hyperparathyroidism of renal origin: Secondary | ICD-10-CM | POA: Diagnosis not present

## 2017-05-18 DIAGNOSIS — N186 End stage renal disease: Secondary | ICD-10-CM | POA: Diagnosis not present

## 2017-05-20 DIAGNOSIS — N186 End stage renal disease: Secondary | ICD-10-CM | POA: Diagnosis not present

## 2017-05-20 DIAGNOSIS — N2581 Secondary hyperparathyroidism of renal origin: Secondary | ICD-10-CM | POA: Diagnosis not present

## 2017-05-22 DIAGNOSIS — N186 End stage renal disease: Secondary | ICD-10-CM | POA: Diagnosis not present

## 2017-05-22 DIAGNOSIS — N2581 Secondary hyperparathyroidism of renal origin: Secondary | ICD-10-CM | POA: Diagnosis not present

## 2017-05-25 DIAGNOSIS — N2581 Secondary hyperparathyroidism of renal origin: Secondary | ICD-10-CM | POA: Diagnosis not present

## 2017-05-25 DIAGNOSIS — N186 End stage renal disease: Secondary | ICD-10-CM | POA: Diagnosis not present

## 2017-05-27 DIAGNOSIS — N186 End stage renal disease: Secondary | ICD-10-CM | POA: Diagnosis not present

## 2017-05-27 DIAGNOSIS — N2581 Secondary hyperparathyroidism of renal origin: Secondary | ICD-10-CM | POA: Diagnosis not present

## 2017-05-29 DIAGNOSIS — N2581 Secondary hyperparathyroidism of renal origin: Secondary | ICD-10-CM | POA: Diagnosis not present

## 2017-05-29 DIAGNOSIS — N186 End stage renal disease: Secondary | ICD-10-CM | POA: Diagnosis not present

## 2017-06-01 DIAGNOSIS — N2581 Secondary hyperparathyroidism of renal origin: Secondary | ICD-10-CM | POA: Diagnosis not present

## 2017-06-01 DIAGNOSIS — N186 End stage renal disease: Secondary | ICD-10-CM | POA: Diagnosis not present

## 2017-06-03 DIAGNOSIS — N2581 Secondary hyperparathyroidism of renal origin: Secondary | ICD-10-CM | POA: Diagnosis not present

## 2017-06-03 DIAGNOSIS — N186 End stage renal disease: Secondary | ICD-10-CM | POA: Diagnosis not present

## 2017-06-05 DIAGNOSIS — N186 End stage renal disease: Secondary | ICD-10-CM | POA: Diagnosis not present

## 2017-06-05 DIAGNOSIS — N2581 Secondary hyperparathyroidism of renal origin: Secondary | ICD-10-CM | POA: Diagnosis not present

## 2017-06-08 DIAGNOSIS — N186 End stage renal disease: Secondary | ICD-10-CM | POA: Diagnosis not present

## 2017-06-08 DIAGNOSIS — N2581 Secondary hyperparathyroidism of renal origin: Secondary | ICD-10-CM | POA: Diagnosis not present

## 2017-06-10 DIAGNOSIS — N2581 Secondary hyperparathyroidism of renal origin: Secondary | ICD-10-CM | POA: Diagnosis not present

## 2017-06-10 DIAGNOSIS — N186 End stage renal disease: Secondary | ICD-10-CM | POA: Diagnosis not present

## 2017-06-12 DIAGNOSIS — N2581 Secondary hyperparathyroidism of renal origin: Secondary | ICD-10-CM | POA: Diagnosis not present

## 2017-06-12 DIAGNOSIS — N186 End stage renal disease: Secondary | ICD-10-CM | POA: Diagnosis not present

## 2017-06-14 DIAGNOSIS — N186 End stage renal disease: Secondary | ICD-10-CM | POA: Diagnosis not present

## 2017-06-14 DIAGNOSIS — Z992 Dependence on renal dialysis: Secondary | ICD-10-CM | POA: Diagnosis not present

## 2017-06-14 DIAGNOSIS — N139 Obstructive and reflux uropathy, unspecified: Secondary | ICD-10-CM | POA: Diagnosis not present

## 2017-06-15 DIAGNOSIS — N186 End stage renal disease: Secondary | ICD-10-CM | POA: Diagnosis not present

## 2017-06-15 DIAGNOSIS — N2581 Secondary hyperparathyroidism of renal origin: Secondary | ICD-10-CM | POA: Diagnosis not present

## 2017-06-16 DIAGNOSIS — M1711 Unilateral primary osteoarthritis, right knee: Secondary | ICD-10-CM | POA: Diagnosis not present

## 2017-06-16 DIAGNOSIS — M1712 Unilateral primary osteoarthritis, left knee: Secondary | ICD-10-CM | POA: Diagnosis not present

## 2017-06-17 DIAGNOSIS — N2581 Secondary hyperparathyroidism of renal origin: Secondary | ICD-10-CM | POA: Diagnosis not present

## 2017-06-17 DIAGNOSIS — N186 End stage renal disease: Secondary | ICD-10-CM | POA: Diagnosis not present

## 2017-06-19 DIAGNOSIS — N2581 Secondary hyperparathyroidism of renal origin: Secondary | ICD-10-CM | POA: Diagnosis not present

## 2017-06-19 DIAGNOSIS — N186 End stage renal disease: Secondary | ICD-10-CM | POA: Diagnosis not present

## 2017-06-22 DIAGNOSIS — N186 End stage renal disease: Secondary | ICD-10-CM | POA: Diagnosis not present

## 2017-06-22 DIAGNOSIS — N2581 Secondary hyperparathyroidism of renal origin: Secondary | ICD-10-CM | POA: Diagnosis not present

## 2017-06-24 DIAGNOSIS — N186 End stage renal disease: Secondary | ICD-10-CM | POA: Diagnosis not present

## 2017-06-24 DIAGNOSIS — N2581 Secondary hyperparathyroidism of renal origin: Secondary | ICD-10-CM | POA: Diagnosis not present

## 2017-06-26 DIAGNOSIS — N2581 Secondary hyperparathyroidism of renal origin: Secondary | ICD-10-CM | POA: Diagnosis not present

## 2017-06-26 DIAGNOSIS — N186 End stage renal disease: Secondary | ICD-10-CM | POA: Diagnosis not present

## 2017-06-29 DIAGNOSIS — N2581 Secondary hyperparathyroidism of renal origin: Secondary | ICD-10-CM | POA: Diagnosis not present

## 2017-06-29 DIAGNOSIS — N186 End stage renal disease: Secondary | ICD-10-CM | POA: Diagnosis not present

## 2017-07-01 DIAGNOSIS — N186 End stage renal disease: Secondary | ICD-10-CM | POA: Diagnosis not present

## 2017-07-01 DIAGNOSIS — N2581 Secondary hyperparathyroidism of renal origin: Secondary | ICD-10-CM | POA: Diagnosis not present

## 2017-07-03 DIAGNOSIS — N2581 Secondary hyperparathyroidism of renal origin: Secondary | ICD-10-CM | POA: Diagnosis not present

## 2017-07-03 DIAGNOSIS — N186 End stage renal disease: Secondary | ICD-10-CM | POA: Diagnosis not present

## 2017-07-06 DIAGNOSIS — N186 End stage renal disease: Secondary | ICD-10-CM | POA: Diagnosis not present

## 2017-07-06 DIAGNOSIS — N2581 Secondary hyperparathyroidism of renal origin: Secondary | ICD-10-CM | POA: Diagnosis not present

## 2017-07-08 DIAGNOSIS — N186 End stage renal disease: Secondary | ICD-10-CM | POA: Diagnosis not present

## 2017-07-08 DIAGNOSIS — N2581 Secondary hyperparathyroidism of renal origin: Secondary | ICD-10-CM | POA: Diagnosis not present

## 2017-07-10 DIAGNOSIS — N2581 Secondary hyperparathyroidism of renal origin: Secondary | ICD-10-CM | POA: Diagnosis not present

## 2017-07-10 DIAGNOSIS — N186 End stage renal disease: Secondary | ICD-10-CM | POA: Diagnosis not present

## 2017-07-13 DIAGNOSIS — N2581 Secondary hyperparathyroidism of renal origin: Secondary | ICD-10-CM | POA: Diagnosis not present

## 2017-07-13 DIAGNOSIS — N186 End stage renal disease: Secondary | ICD-10-CM | POA: Diagnosis not present

## 2017-07-14 DIAGNOSIS — Z992 Dependence on renal dialysis: Secondary | ICD-10-CM | POA: Diagnosis not present

## 2017-07-14 DIAGNOSIS — N139 Obstructive and reflux uropathy, unspecified: Secondary | ICD-10-CM | POA: Diagnosis not present

## 2017-07-14 DIAGNOSIS — N186 End stage renal disease: Secondary | ICD-10-CM | POA: Diagnosis not present

## 2017-07-15 DIAGNOSIS — N2581 Secondary hyperparathyroidism of renal origin: Secondary | ICD-10-CM | POA: Diagnosis not present

## 2017-07-15 DIAGNOSIS — N186 End stage renal disease: Secondary | ICD-10-CM | POA: Diagnosis not present

## 2017-07-17 DIAGNOSIS — N2581 Secondary hyperparathyroidism of renal origin: Secondary | ICD-10-CM | POA: Diagnosis not present

## 2017-07-17 DIAGNOSIS — N186 End stage renal disease: Secondary | ICD-10-CM | POA: Diagnosis not present

## 2017-07-20 DIAGNOSIS — N2581 Secondary hyperparathyroidism of renal origin: Secondary | ICD-10-CM | POA: Diagnosis not present

## 2017-07-20 DIAGNOSIS — N186 End stage renal disease: Secondary | ICD-10-CM | POA: Diagnosis not present

## 2017-07-22 DIAGNOSIS — N2581 Secondary hyperparathyroidism of renal origin: Secondary | ICD-10-CM | POA: Diagnosis not present

## 2017-07-22 DIAGNOSIS — N186 End stage renal disease: Secondary | ICD-10-CM | POA: Diagnosis not present

## 2017-07-24 DIAGNOSIS — N2581 Secondary hyperparathyroidism of renal origin: Secondary | ICD-10-CM | POA: Diagnosis not present

## 2017-07-24 DIAGNOSIS — N186 End stage renal disease: Secondary | ICD-10-CM | POA: Diagnosis not present

## 2017-07-27 DIAGNOSIS — N2581 Secondary hyperparathyroidism of renal origin: Secondary | ICD-10-CM | POA: Diagnosis not present

## 2017-07-27 DIAGNOSIS — N186 End stage renal disease: Secondary | ICD-10-CM | POA: Diagnosis not present

## 2017-07-29 DIAGNOSIS — N2581 Secondary hyperparathyroidism of renal origin: Secondary | ICD-10-CM | POA: Diagnosis not present

## 2017-07-29 DIAGNOSIS — N186 End stage renal disease: Secondary | ICD-10-CM | POA: Diagnosis not present

## 2017-07-31 DIAGNOSIS — N2581 Secondary hyperparathyroidism of renal origin: Secondary | ICD-10-CM | POA: Diagnosis not present

## 2017-07-31 DIAGNOSIS — N186 End stage renal disease: Secondary | ICD-10-CM | POA: Diagnosis not present

## 2017-08-03 DIAGNOSIS — N186 End stage renal disease: Secondary | ICD-10-CM | POA: Diagnosis not present

## 2017-08-03 DIAGNOSIS — N2581 Secondary hyperparathyroidism of renal origin: Secondary | ICD-10-CM | POA: Diagnosis not present

## 2017-08-05 DIAGNOSIS — N186 End stage renal disease: Secondary | ICD-10-CM | POA: Diagnosis not present

## 2017-08-05 DIAGNOSIS — N2581 Secondary hyperparathyroidism of renal origin: Secondary | ICD-10-CM | POA: Diagnosis not present

## 2017-08-07 DIAGNOSIS — N186 End stage renal disease: Secondary | ICD-10-CM | POA: Diagnosis not present

## 2017-08-07 DIAGNOSIS — N2581 Secondary hyperparathyroidism of renal origin: Secondary | ICD-10-CM | POA: Diagnosis not present

## 2017-08-10 DIAGNOSIS — N186 End stage renal disease: Secondary | ICD-10-CM | POA: Diagnosis not present

## 2017-08-10 DIAGNOSIS — N2581 Secondary hyperparathyroidism of renal origin: Secondary | ICD-10-CM | POA: Diagnosis not present

## 2017-08-12 DIAGNOSIS — N2581 Secondary hyperparathyroidism of renal origin: Secondary | ICD-10-CM | POA: Diagnosis not present

## 2017-08-12 DIAGNOSIS — N186 End stage renal disease: Secondary | ICD-10-CM | POA: Diagnosis not present

## 2017-08-14 DIAGNOSIS — N139 Obstructive and reflux uropathy, unspecified: Secondary | ICD-10-CM | POA: Diagnosis not present

## 2017-08-14 DIAGNOSIS — N186 End stage renal disease: Secondary | ICD-10-CM | POA: Diagnosis not present

## 2017-08-14 DIAGNOSIS — N2581 Secondary hyperparathyroidism of renal origin: Secondary | ICD-10-CM | POA: Diagnosis not present

## 2017-08-14 DIAGNOSIS — Z992 Dependence on renal dialysis: Secondary | ICD-10-CM | POA: Diagnosis not present

## 2017-08-17 DIAGNOSIS — N186 End stage renal disease: Secondary | ICD-10-CM | POA: Diagnosis not present

## 2017-08-17 DIAGNOSIS — N2581 Secondary hyperparathyroidism of renal origin: Secondary | ICD-10-CM | POA: Diagnosis not present

## 2017-08-19 DIAGNOSIS — N2581 Secondary hyperparathyroidism of renal origin: Secondary | ICD-10-CM | POA: Diagnosis not present

## 2017-08-19 DIAGNOSIS — N186 End stage renal disease: Secondary | ICD-10-CM | POA: Diagnosis not present

## 2017-08-21 DIAGNOSIS — N2581 Secondary hyperparathyroidism of renal origin: Secondary | ICD-10-CM | POA: Diagnosis not present

## 2017-08-21 DIAGNOSIS — N186 End stage renal disease: Secondary | ICD-10-CM | POA: Diagnosis not present

## 2017-08-24 DIAGNOSIS — N2581 Secondary hyperparathyroidism of renal origin: Secondary | ICD-10-CM | POA: Diagnosis not present

## 2017-08-24 DIAGNOSIS — N186 End stage renal disease: Secondary | ICD-10-CM | POA: Diagnosis not present

## 2017-08-26 DIAGNOSIS — N186 End stage renal disease: Secondary | ICD-10-CM | POA: Diagnosis not present

## 2017-08-26 DIAGNOSIS — N2581 Secondary hyperparathyroidism of renal origin: Secondary | ICD-10-CM | POA: Diagnosis not present

## 2017-08-28 DIAGNOSIS — N2581 Secondary hyperparathyroidism of renal origin: Secondary | ICD-10-CM | POA: Diagnosis not present

## 2017-08-28 DIAGNOSIS — N186 End stage renal disease: Secondary | ICD-10-CM | POA: Diagnosis not present

## 2017-08-31 DIAGNOSIS — N2581 Secondary hyperparathyroidism of renal origin: Secondary | ICD-10-CM | POA: Diagnosis not present

## 2017-08-31 DIAGNOSIS — N186 End stage renal disease: Secondary | ICD-10-CM | POA: Diagnosis not present

## 2017-09-02 DIAGNOSIS — N186 End stage renal disease: Secondary | ICD-10-CM | POA: Diagnosis not present

## 2017-09-02 DIAGNOSIS — N2581 Secondary hyperparathyroidism of renal origin: Secondary | ICD-10-CM | POA: Diagnosis not present

## 2017-09-04 DIAGNOSIS — N186 End stage renal disease: Secondary | ICD-10-CM | POA: Diagnosis not present

## 2017-09-04 DIAGNOSIS — N2581 Secondary hyperparathyroidism of renal origin: Secondary | ICD-10-CM | POA: Diagnosis not present

## 2017-09-07 DIAGNOSIS — N2581 Secondary hyperparathyroidism of renal origin: Secondary | ICD-10-CM | POA: Diagnosis not present

## 2017-09-07 DIAGNOSIS — N186 End stage renal disease: Secondary | ICD-10-CM | POA: Diagnosis not present

## 2017-09-09 DIAGNOSIS — N186 End stage renal disease: Secondary | ICD-10-CM | POA: Diagnosis not present

## 2017-09-09 DIAGNOSIS — N2581 Secondary hyperparathyroidism of renal origin: Secondary | ICD-10-CM | POA: Diagnosis not present

## 2017-09-11 DIAGNOSIS — N2581 Secondary hyperparathyroidism of renal origin: Secondary | ICD-10-CM | POA: Diagnosis not present

## 2017-09-11 DIAGNOSIS — N186 End stage renal disease: Secondary | ICD-10-CM | POA: Diagnosis not present

## 2017-09-13 DIAGNOSIS — N139 Obstructive and reflux uropathy, unspecified: Secondary | ICD-10-CM | POA: Diagnosis not present

## 2017-09-13 DIAGNOSIS — Z992 Dependence on renal dialysis: Secondary | ICD-10-CM | POA: Diagnosis not present

## 2017-09-13 DIAGNOSIS — N186 End stage renal disease: Secondary | ICD-10-CM | POA: Diagnosis not present

## 2017-09-14 DIAGNOSIS — N2581 Secondary hyperparathyroidism of renal origin: Secondary | ICD-10-CM | POA: Diagnosis not present

## 2017-09-14 DIAGNOSIS — N186 End stage renal disease: Secondary | ICD-10-CM | POA: Diagnosis not present

## 2017-09-14 DIAGNOSIS — D631 Anemia in chronic kidney disease: Secondary | ICD-10-CM | POA: Diagnosis not present

## 2017-09-16 DIAGNOSIS — D631 Anemia in chronic kidney disease: Secondary | ICD-10-CM | POA: Diagnosis not present

## 2017-09-16 DIAGNOSIS — N186 End stage renal disease: Secondary | ICD-10-CM | POA: Diagnosis not present

## 2017-09-16 DIAGNOSIS — N2581 Secondary hyperparathyroidism of renal origin: Secondary | ICD-10-CM | POA: Diagnosis not present

## 2017-09-18 DIAGNOSIS — N2581 Secondary hyperparathyroidism of renal origin: Secondary | ICD-10-CM | POA: Diagnosis not present

## 2017-09-18 DIAGNOSIS — D631 Anemia in chronic kidney disease: Secondary | ICD-10-CM | POA: Diagnosis not present

## 2017-09-18 DIAGNOSIS — N186 End stage renal disease: Secondary | ICD-10-CM | POA: Diagnosis not present

## 2017-09-21 DIAGNOSIS — N2581 Secondary hyperparathyroidism of renal origin: Secondary | ICD-10-CM | POA: Diagnosis not present

## 2017-09-21 DIAGNOSIS — N186 End stage renal disease: Secondary | ICD-10-CM | POA: Diagnosis not present

## 2017-09-21 DIAGNOSIS — D631 Anemia in chronic kidney disease: Secondary | ICD-10-CM | POA: Diagnosis not present

## 2017-09-24 DIAGNOSIS — N2581 Secondary hyperparathyroidism of renal origin: Secondary | ICD-10-CM | POA: Diagnosis not present

## 2017-09-24 DIAGNOSIS — I871 Compression of vein: Secondary | ICD-10-CM | POA: Diagnosis not present

## 2017-09-24 DIAGNOSIS — Z992 Dependence on renal dialysis: Secondary | ICD-10-CM | POA: Diagnosis not present

## 2017-09-24 DIAGNOSIS — T82868A Thrombosis of vascular prosthetic devices, implants and grafts, initial encounter: Secondary | ICD-10-CM | POA: Diagnosis not present

## 2017-09-24 DIAGNOSIS — N186 End stage renal disease: Secondary | ICD-10-CM | POA: Diagnosis not present

## 2017-09-24 DIAGNOSIS — D631 Anemia in chronic kidney disease: Secondary | ICD-10-CM | POA: Diagnosis not present

## 2017-09-24 DIAGNOSIS — T82858A Stenosis of vascular prosthetic devices, implants and grafts, initial encounter: Secondary | ICD-10-CM | POA: Diagnosis not present

## 2017-09-25 DIAGNOSIS — D631 Anemia in chronic kidney disease: Secondary | ICD-10-CM | POA: Diagnosis not present

## 2017-09-25 DIAGNOSIS — N186 End stage renal disease: Secondary | ICD-10-CM | POA: Diagnosis not present

## 2017-09-25 DIAGNOSIS — N2581 Secondary hyperparathyroidism of renal origin: Secondary | ICD-10-CM | POA: Diagnosis not present

## 2017-09-28 DIAGNOSIS — N186 End stage renal disease: Secondary | ICD-10-CM | POA: Diagnosis not present

## 2017-09-28 DIAGNOSIS — N2581 Secondary hyperparathyroidism of renal origin: Secondary | ICD-10-CM | POA: Diagnosis not present

## 2017-09-28 DIAGNOSIS — D631 Anemia in chronic kidney disease: Secondary | ICD-10-CM | POA: Diagnosis not present

## 2017-09-30 DIAGNOSIS — N186 End stage renal disease: Secondary | ICD-10-CM | POA: Diagnosis not present

## 2017-09-30 DIAGNOSIS — D631 Anemia in chronic kidney disease: Secondary | ICD-10-CM | POA: Diagnosis not present

## 2017-09-30 DIAGNOSIS — N2581 Secondary hyperparathyroidism of renal origin: Secondary | ICD-10-CM | POA: Diagnosis not present

## 2017-10-02 DIAGNOSIS — D631 Anemia in chronic kidney disease: Secondary | ICD-10-CM | POA: Diagnosis not present

## 2017-10-02 DIAGNOSIS — N2581 Secondary hyperparathyroidism of renal origin: Secondary | ICD-10-CM | POA: Diagnosis not present

## 2017-10-02 DIAGNOSIS — N186 End stage renal disease: Secondary | ICD-10-CM | POA: Diagnosis not present

## 2017-10-05 DIAGNOSIS — N186 End stage renal disease: Secondary | ICD-10-CM | POA: Diagnosis not present

## 2017-10-05 DIAGNOSIS — N2581 Secondary hyperparathyroidism of renal origin: Secondary | ICD-10-CM | POA: Diagnosis not present

## 2017-10-05 DIAGNOSIS — D631 Anemia in chronic kidney disease: Secondary | ICD-10-CM | POA: Diagnosis not present

## 2017-10-07 DIAGNOSIS — N2581 Secondary hyperparathyroidism of renal origin: Secondary | ICD-10-CM | POA: Diagnosis not present

## 2017-10-07 DIAGNOSIS — D631 Anemia in chronic kidney disease: Secondary | ICD-10-CM | POA: Diagnosis not present

## 2017-10-07 DIAGNOSIS — N186 End stage renal disease: Secondary | ICD-10-CM | POA: Diagnosis not present

## 2017-10-09 DIAGNOSIS — N186 End stage renal disease: Secondary | ICD-10-CM | POA: Diagnosis not present

## 2017-10-09 DIAGNOSIS — D631 Anemia in chronic kidney disease: Secondary | ICD-10-CM | POA: Diagnosis not present

## 2017-10-09 DIAGNOSIS — N2581 Secondary hyperparathyroidism of renal origin: Secondary | ICD-10-CM | POA: Diagnosis not present

## 2017-10-12 DIAGNOSIS — N186 End stage renal disease: Secondary | ICD-10-CM | POA: Diagnosis not present

## 2017-10-12 DIAGNOSIS — D631 Anemia in chronic kidney disease: Secondary | ICD-10-CM | POA: Diagnosis not present

## 2017-10-12 DIAGNOSIS — N2581 Secondary hyperparathyroidism of renal origin: Secondary | ICD-10-CM | POA: Diagnosis not present

## 2017-10-14 DIAGNOSIS — Z992 Dependence on renal dialysis: Secondary | ICD-10-CM | POA: Diagnosis not present

## 2017-10-14 DIAGNOSIS — N139 Obstructive and reflux uropathy, unspecified: Secondary | ICD-10-CM | POA: Diagnosis not present

## 2017-10-14 DIAGNOSIS — N2581 Secondary hyperparathyroidism of renal origin: Secondary | ICD-10-CM | POA: Diagnosis not present

## 2017-10-14 DIAGNOSIS — N186 End stage renal disease: Secondary | ICD-10-CM | POA: Diagnosis not present

## 2017-10-14 DIAGNOSIS — D631 Anemia in chronic kidney disease: Secondary | ICD-10-CM | POA: Diagnosis not present

## 2017-10-16 DIAGNOSIS — D631 Anemia in chronic kidney disease: Secondary | ICD-10-CM | POA: Diagnosis not present

## 2017-10-16 DIAGNOSIS — N186 End stage renal disease: Secondary | ICD-10-CM | POA: Diagnosis not present

## 2017-10-16 DIAGNOSIS — N2581 Secondary hyperparathyroidism of renal origin: Secondary | ICD-10-CM | POA: Diagnosis not present

## 2017-10-19 DIAGNOSIS — N186 End stage renal disease: Secondary | ICD-10-CM | POA: Diagnosis not present

## 2017-10-19 DIAGNOSIS — D631 Anemia in chronic kidney disease: Secondary | ICD-10-CM | POA: Diagnosis not present

## 2017-10-19 DIAGNOSIS — N2581 Secondary hyperparathyroidism of renal origin: Secondary | ICD-10-CM | POA: Diagnosis not present

## 2017-10-21 DIAGNOSIS — D631 Anemia in chronic kidney disease: Secondary | ICD-10-CM | POA: Diagnosis not present

## 2017-10-21 DIAGNOSIS — N2581 Secondary hyperparathyroidism of renal origin: Secondary | ICD-10-CM | POA: Diagnosis not present

## 2017-10-21 DIAGNOSIS — N186 End stage renal disease: Secondary | ICD-10-CM | POA: Diagnosis not present

## 2017-10-23 DIAGNOSIS — D631 Anemia in chronic kidney disease: Secondary | ICD-10-CM | POA: Diagnosis not present

## 2017-10-23 DIAGNOSIS — N186 End stage renal disease: Secondary | ICD-10-CM | POA: Diagnosis not present

## 2017-10-23 DIAGNOSIS — N2581 Secondary hyperparathyroidism of renal origin: Secondary | ICD-10-CM | POA: Diagnosis not present

## 2017-10-26 DIAGNOSIS — N2581 Secondary hyperparathyroidism of renal origin: Secondary | ICD-10-CM | POA: Diagnosis not present

## 2017-10-26 DIAGNOSIS — N186 End stage renal disease: Secondary | ICD-10-CM | POA: Diagnosis not present

## 2017-10-26 DIAGNOSIS — D631 Anemia in chronic kidney disease: Secondary | ICD-10-CM | POA: Diagnosis not present

## 2017-10-28 DIAGNOSIS — D631 Anemia in chronic kidney disease: Secondary | ICD-10-CM | POA: Diagnosis not present

## 2017-10-28 DIAGNOSIS — N186 End stage renal disease: Secondary | ICD-10-CM | POA: Diagnosis not present

## 2017-10-28 DIAGNOSIS — N2581 Secondary hyperparathyroidism of renal origin: Secondary | ICD-10-CM | POA: Diagnosis not present

## 2017-10-30 DIAGNOSIS — N2581 Secondary hyperparathyroidism of renal origin: Secondary | ICD-10-CM | POA: Diagnosis not present

## 2017-10-30 DIAGNOSIS — D631 Anemia in chronic kidney disease: Secondary | ICD-10-CM | POA: Diagnosis not present

## 2017-10-30 DIAGNOSIS — N186 End stage renal disease: Secondary | ICD-10-CM | POA: Diagnosis not present

## 2017-11-02 DIAGNOSIS — D631 Anemia in chronic kidney disease: Secondary | ICD-10-CM | POA: Diagnosis not present

## 2017-11-02 DIAGNOSIS — N2581 Secondary hyperparathyroidism of renal origin: Secondary | ICD-10-CM | POA: Diagnosis not present

## 2017-11-02 DIAGNOSIS — N186 End stage renal disease: Secondary | ICD-10-CM | POA: Diagnosis not present

## 2017-11-04 DIAGNOSIS — D631 Anemia in chronic kidney disease: Secondary | ICD-10-CM | POA: Diagnosis not present

## 2017-11-04 DIAGNOSIS — N186 End stage renal disease: Secondary | ICD-10-CM | POA: Diagnosis not present

## 2017-11-04 DIAGNOSIS — N2581 Secondary hyperparathyroidism of renal origin: Secondary | ICD-10-CM | POA: Diagnosis not present

## 2017-11-06 DIAGNOSIS — N2581 Secondary hyperparathyroidism of renal origin: Secondary | ICD-10-CM | POA: Diagnosis not present

## 2017-11-06 DIAGNOSIS — D631 Anemia in chronic kidney disease: Secondary | ICD-10-CM | POA: Diagnosis not present

## 2017-11-06 DIAGNOSIS — N186 End stage renal disease: Secondary | ICD-10-CM | POA: Diagnosis not present

## 2017-11-09 DIAGNOSIS — N186 End stage renal disease: Secondary | ICD-10-CM | POA: Diagnosis not present

## 2017-11-09 DIAGNOSIS — D631 Anemia in chronic kidney disease: Secondary | ICD-10-CM | POA: Diagnosis not present

## 2017-11-09 DIAGNOSIS — N2581 Secondary hyperparathyroidism of renal origin: Secondary | ICD-10-CM | POA: Diagnosis not present

## 2017-11-11 DIAGNOSIS — N186 End stage renal disease: Secondary | ICD-10-CM | POA: Diagnosis not present

## 2017-11-11 DIAGNOSIS — D631 Anemia in chronic kidney disease: Secondary | ICD-10-CM | POA: Diagnosis not present

## 2017-11-11 DIAGNOSIS — N2581 Secondary hyperparathyroidism of renal origin: Secondary | ICD-10-CM | POA: Diagnosis not present

## 2017-11-13 DIAGNOSIS — D631 Anemia in chronic kidney disease: Secondary | ICD-10-CM | POA: Diagnosis not present

## 2017-11-13 DIAGNOSIS — N186 End stage renal disease: Secondary | ICD-10-CM | POA: Diagnosis not present

## 2017-11-13 DIAGNOSIS — N2581 Secondary hyperparathyroidism of renal origin: Secondary | ICD-10-CM | POA: Diagnosis not present

## 2017-11-14 DIAGNOSIS — Z992 Dependence on renal dialysis: Secondary | ICD-10-CM | POA: Diagnosis not present

## 2017-11-14 DIAGNOSIS — N139 Obstructive and reflux uropathy, unspecified: Secondary | ICD-10-CM | POA: Diagnosis not present

## 2017-11-14 DIAGNOSIS — N186 End stage renal disease: Secondary | ICD-10-CM | POA: Diagnosis not present

## 2017-11-16 DIAGNOSIS — Z23 Encounter for immunization: Secondary | ICD-10-CM | POA: Diagnosis not present

## 2017-11-16 DIAGNOSIS — N186 End stage renal disease: Secondary | ICD-10-CM | POA: Diagnosis not present

## 2017-11-16 DIAGNOSIS — N2581 Secondary hyperparathyroidism of renal origin: Secondary | ICD-10-CM | POA: Diagnosis not present

## 2017-11-18 DIAGNOSIS — Z23 Encounter for immunization: Secondary | ICD-10-CM | POA: Diagnosis not present

## 2017-11-18 DIAGNOSIS — N2581 Secondary hyperparathyroidism of renal origin: Secondary | ICD-10-CM | POA: Diagnosis not present

## 2017-11-18 DIAGNOSIS — N186 End stage renal disease: Secondary | ICD-10-CM | POA: Diagnosis not present

## 2017-11-20 DIAGNOSIS — N2581 Secondary hyperparathyroidism of renal origin: Secondary | ICD-10-CM | POA: Diagnosis not present

## 2017-11-20 DIAGNOSIS — Z23 Encounter for immunization: Secondary | ICD-10-CM | POA: Diagnosis not present

## 2017-11-20 DIAGNOSIS — N186 End stage renal disease: Secondary | ICD-10-CM | POA: Diagnosis not present

## 2017-11-23 DIAGNOSIS — Z23 Encounter for immunization: Secondary | ICD-10-CM | POA: Diagnosis not present

## 2017-11-23 DIAGNOSIS — N2581 Secondary hyperparathyroidism of renal origin: Secondary | ICD-10-CM | POA: Diagnosis not present

## 2017-11-23 DIAGNOSIS — N186 End stage renal disease: Secondary | ICD-10-CM | POA: Diagnosis not present

## 2017-11-25 DIAGNOSIS — Z23 Encounter for immunization: Secondary | ICD-10-CM | POA: Diagnosis not present

## 2017-11-25 DIAGNOSIS — N2581 Secondary hyperparathyroidism of renal origin: Secondary | ICD-10-CM | POA: Diagnosis not present

## 2017-11-25 DIAGNOSIS — N186 End stage renal disease: Secondary | ICD-10-CM | POA: Diagnosis not present

## 2017-11-27 DIAGNOSIS — Z23 Encounter for immunization: Secondary | ICD-10-CM | POA: Diagnosis not present

## 2017-11-27 DIAGNOSIS — N2581 Secondary hyperparathyroidism of renal origin: Secondary | ICD-10-CM | POA: Diagnosis not present

## 2017-11-27 DIAGNOSIS — N186 End stage renal disease: Secondary | ICD-10-CM | POA: Diagnosis not present

## 2017-11-30 DIAGNOSIS — N2581 Secondary hyperparathyroidism of renal origin: Secondary | ICD-10-CM | POA: Diagnosis not present

## 2017-11-30 DIAGNOSIS — Z23 Encounter for immunization: Secondary | ICD-10-CM | POA: Diagnosis not present

## 2017-11-30 DIAGNOSIS — N186 End stage renal disease: Secondary | ICD-10-CM | POA: Diagnosis not present

## 2017-12-02 DIAGNOSIS — Z23 Encounter for immunization: Secondary | ICD-10-CM | POA: Diagnosis not present

## 2017-12-02 DIAGNOSIS — N2581 Secondary hyperparathyroidism of renal origin: Secondary | ICD-10-CM | POA: Diagnosis not present

## 2017-12-02 DIAGNOSIS — N186 End stage renal disease: Secondary | ICD-10-CM | POA: Diagnosis not present

## 2017-12-04 DIAGNOSIS — Z23 Encounter for immunization: Secondary | ICD-10-CM | POA: Diagnosis not present

## 2017-12-04 DIAGNOSIS — N2581 Secondary hyperparathyroidism of renal origin: Secondary | ICD-10-CM | POA: Diagnosis not present

## 2017-12-04 DIAGNOSIS — N186 End stage renal disease: Secondary | ICD-10-CM | POA: Diagnosis not present

## 2017-12-07 DIAGNOSIS — N186 End stage renal disease: Secondary | ICD-10-CM | POA: Diagnosis not present

## 2017-12-07 DIAGNOSIS — N2581 Secondary hyperparathyroidism of renal origin: Secondary | ICD-10-CM | POA: Diagnosis not present

## 2017-12-07 DIAGNOSIS — Z23 Encounter for immunization: Secondary | ICD-10-CM | POA: Diagnosis not present

## 2017-12-09 DIAGNOSIS — Z23 Encounter for immunization: Secondary | ICD-10-CM | POA: Diagnosis not present

## 2017-12-09 DIAGNOSIS — N186 End stage renal disease: Secondary | ICD-10-CM | POA: Diagnosis not present

## 2017-12-09 DIAGNOSIS — N2581 Secondary hyperparathyroidism of renal origin: Secondary | ICD-10-CM | POA: Diagnosis not present

## 2017-12-11 DIAGNOSIS — Z23 Encounter for immunization: Secondary | ICD-10-CM | POA: Diagnosis not present

## 2017-12-11 DIAGNOSIS — N186 End stage renal disease: Secondary | ICD-10-CM | POA: Diagnosis not present

## 2017-12-11 DIAGNOSIS — N2581 Secondary hyperparathyroidism of renal origin: Secondary | ICD-10-CM | POA: Diagnosis not present

## 2017-12-14 DIAGNOSIS — N186 End stage renal disease: Secondary | ICD-10-CM | POA: Diagnosis not present

## 2017-12-14 DIAGNOSIS — N139 Obstructive and reflux uropathy, unspecified: Secondary | ICD-10-CM | POA: Diagnosis not present

## 2017-12-14 DIAGNOSIS — N2581 Secondary hyperparathyroidism of renal origin: Secondary | ICD-10-CM | POA: Diagnosis not present

## 2017-12-14 DIAGNOSIS — Z992 Dependence on renal dialysis: Secondary | ICD-10-CM | POA: Diagnosis not present

## 2017-12-17 DIAGNOSIS — N2581 Secondary hyperparathyroidism of renal origin: Secondary | ICD-10-CM | POA: Diagnosis not present

## 2017-12-17 DIAGNOSIS — N186 End stage renal disease: Secondary | ICD-10-CM | POA: Diagnosis not present

## 2017-12-18 DIAGNOSIS — N2581 Secondary hyperparathyroidism of renal origin: Secondary | ICD-10-CM | POA: Diagnosis not present

## 2017-12-18 DIAGNOSIS — N186 End stage renal disease: Secondary | ICD-10-CM | POA: Diagnosis not present

## 2017-12-21 DIAGNOSIS — N186 End stage renal disease: Secondary | ICD-10-CM | POA: Diagnosis not present

## 2017-12-21 DIAGNOSIS — N2581 Secondary hyperparathyroidism of renal origin: Secondary | ICD-10-CM | POA: Diagnosis not present

## 2017-12-23 DIAGNOSIS — N186 End stage renal disease: Secondary | ICD-10-CM | POA: Diagnosis not present

## 2017-12-23 DIAGNOSIS — N2581 Secondary hyperparathyroidism of renal origin: Secondary | ICD-10-CM | POA: Diagnosis not present

## 2017-12-25 DIAGNOSIS — N2581 Secondary hyperparathyroidism of renal origin: Secondary | ICD-10-CM | POA: Diagnosis not present

## 2017-12-25 DIAGNOSIS — N186 End stage renal disease: Secondary | ICD-10-CM | POA: Diagnosis not present

## 2017-12-28 DIAGNOSIS — N2581 Secondary hyperparathyroidism of renal origin: Secondary | ICD-10-CM | POA: Diagnosis not present

## 2017-12-28 DIAGNOSIS — N186 End stage renal disease: Secondary | ICD-10-CM | POA: Diagnosis not present

## 2017-12-30 DIAGNOSIS — N2581 Secondary hyperparathyroidism of renal origin: Secondary | ICD-10-CM | POA: Diagnosis not present

## 2017-12-30 DIAGNOSIS — N186 End stage renal disease: Secondary | ICD-10-CM | POA: Diagnosis not present

## 2017-12-31 ENCOUNTER — Emergency Department (HOSPITAL_COMMUNITY)
Admission: EM | Admit: 2017-12-31 | Discharge: 2017-12-31 | Disposition: A | Discharge status: Home / Self Care | Payer: Medicare Other | Attending: Emergency Medicine | Admitting: Emergency Medicine

## 2017-12-31 ENCOUNTER — Emergency Department (HOSPITAL_COMMUNITY): Payer: Medicare Other

## 2017-12-31 ENCOUNTER — Encounter (HOSPITAL_COMMUNITY): Payer: Self-pay

## 2017-12-31 DIAGNOSIS — S8991XA Unspecified injury of right lower leg, initial encounter: Secondary | ICD-10-CM | POA: Diagnosis not present

## 2017-12-31 DIAGNOSIS — R52 Pain, unspecified: Secondary | ICD-10-CM | POA: Diagnosis not present

## 2017-12-31 DIAGNOSIS — M25561 Pain in right knee: Secondary | ICD-10-CM | POA: Diagnosis not present

## 2017-12-31 DIAGNOSIS — M25462 Effusion, left knee: Secondary | ICD-10-CM | POA: Insufficient documentation

## 2017-12-31 DIAGNOSIS — N186 End stage renal disease: Secondary | ICD-10-CM | POA: Diagnosis not present

## 2017-12-31 DIAGNOSIS — M25532 Pain in left wrist: Secondary | ICD-10-CM | POA: Insufficient documentation

## 2017-12-31 DIAGNOSIS — S6992XA Unspecified injury of left wrist, hand and finger(s), initial encounter: Secondary | ICD-10-CM | POA: Diagnosis not present

## 2017-12-31 DIAGNOSIS — Z79899 Other long term (current) drug therapy: Secondary | ICD-10-CM | POA: Insufficient documentation

## 2017-12-31 DIAGNOSIS — W19XXXA Unspecified fall, initial encounter: Secondary | ICD-10-CM

## 2017-12-31 DIAGNOSIS — S8992XA Unspecified injury of left lower leg, initial encounter: Secondary | ICD-10-CM | POA: Diagnosis not present

## 2017-12-31 DIAGNOSIS — M25562 Pain in left knee: Secondary | ICD-10-CM | POA: Diagnosis not present

## 2017-12-31 IMAGING — DX DG KNEE 1-2V*L*
2 series · 2 of 2 positions shown · non-contrast
Comparison: None.

CLINICAL DATA: Left knee pain due to an injury in a trip and fall
today. Initial encounter.

EXAM:
LEFT KNEE - 1-2 VIEW

[knee ap]
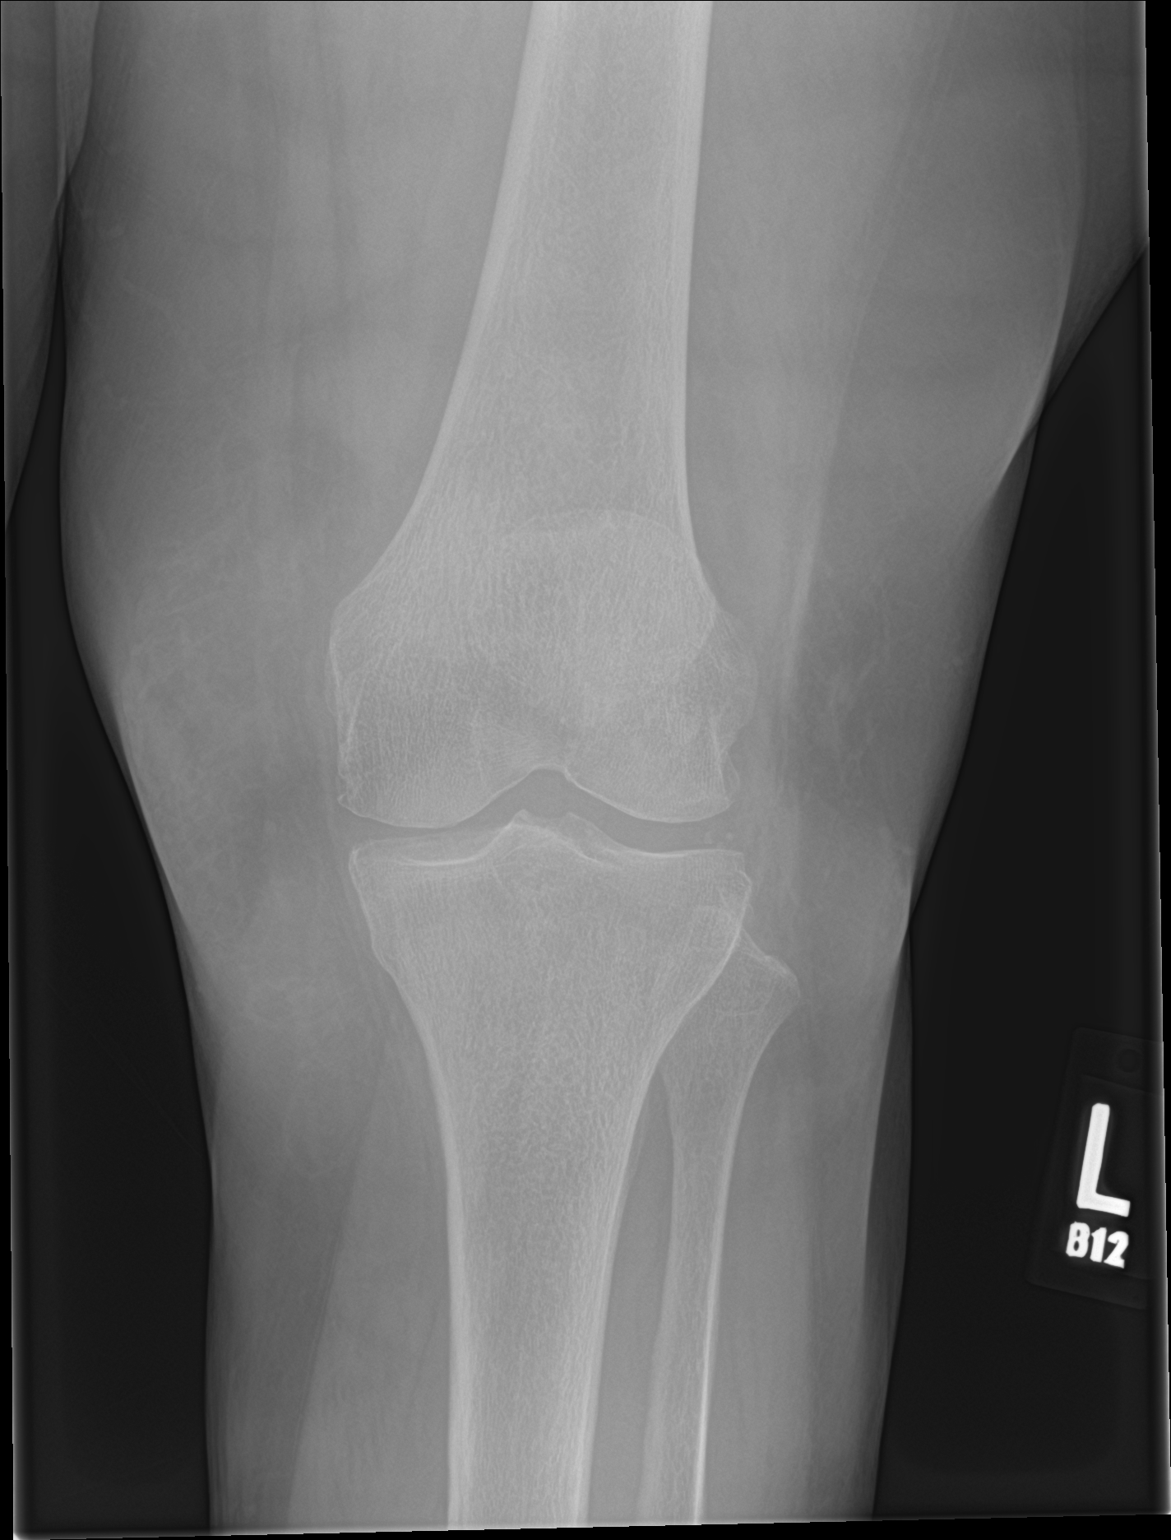

[knee lat]
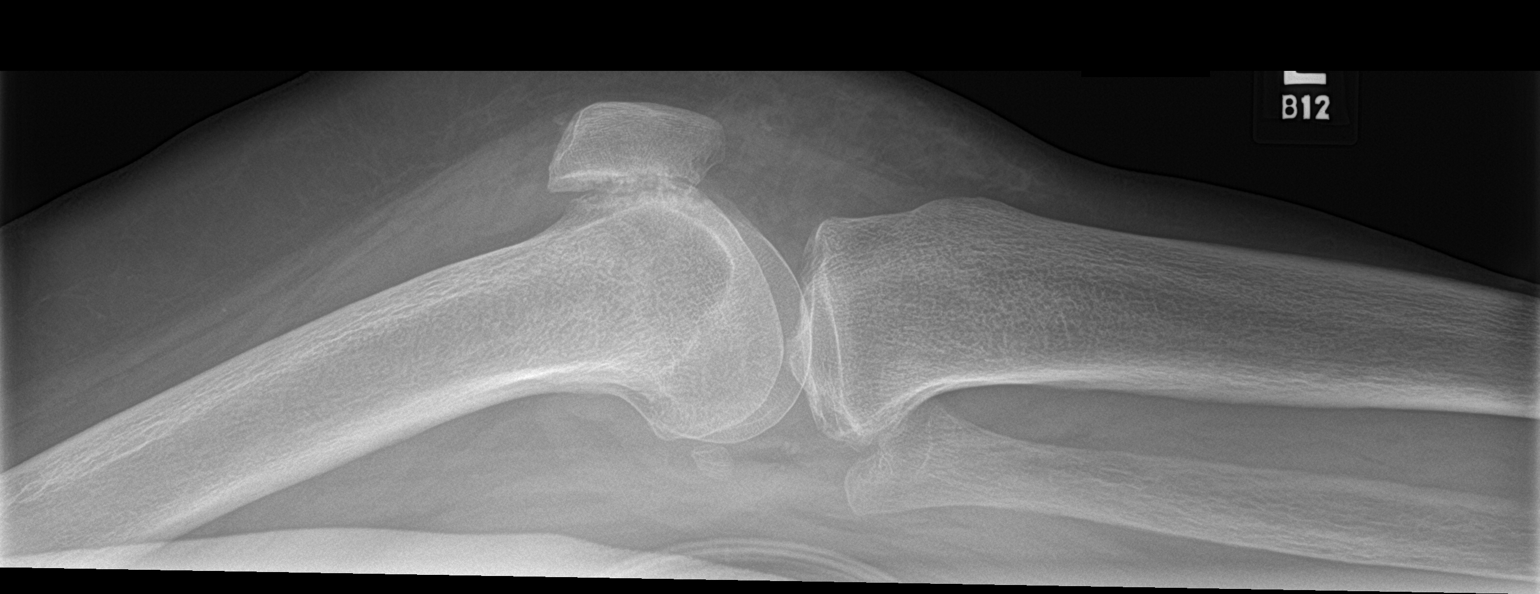

[2 of 2 positions shown; findings below may reference images not displayed]

FINDINGS: No acute bony or joint abnormality is identified. Small joint
effusion is noted. The patient has advanced patellofemoral
osteoarthritis.
IMPRESSION: No acute finding.

Advanced patellofemoral osteoarthritis.

## 2017-12-31 IMAGING — DX DG KNEE 1-2V*R*
2 series · 2 of 2 positions shown · non-contrast
Comparison: None.

CLINICAL DATA: Fall.  Knee pain/tingling.

EXAM:
RIGHT KNEE - 1-2 VIEW

[knee ap]
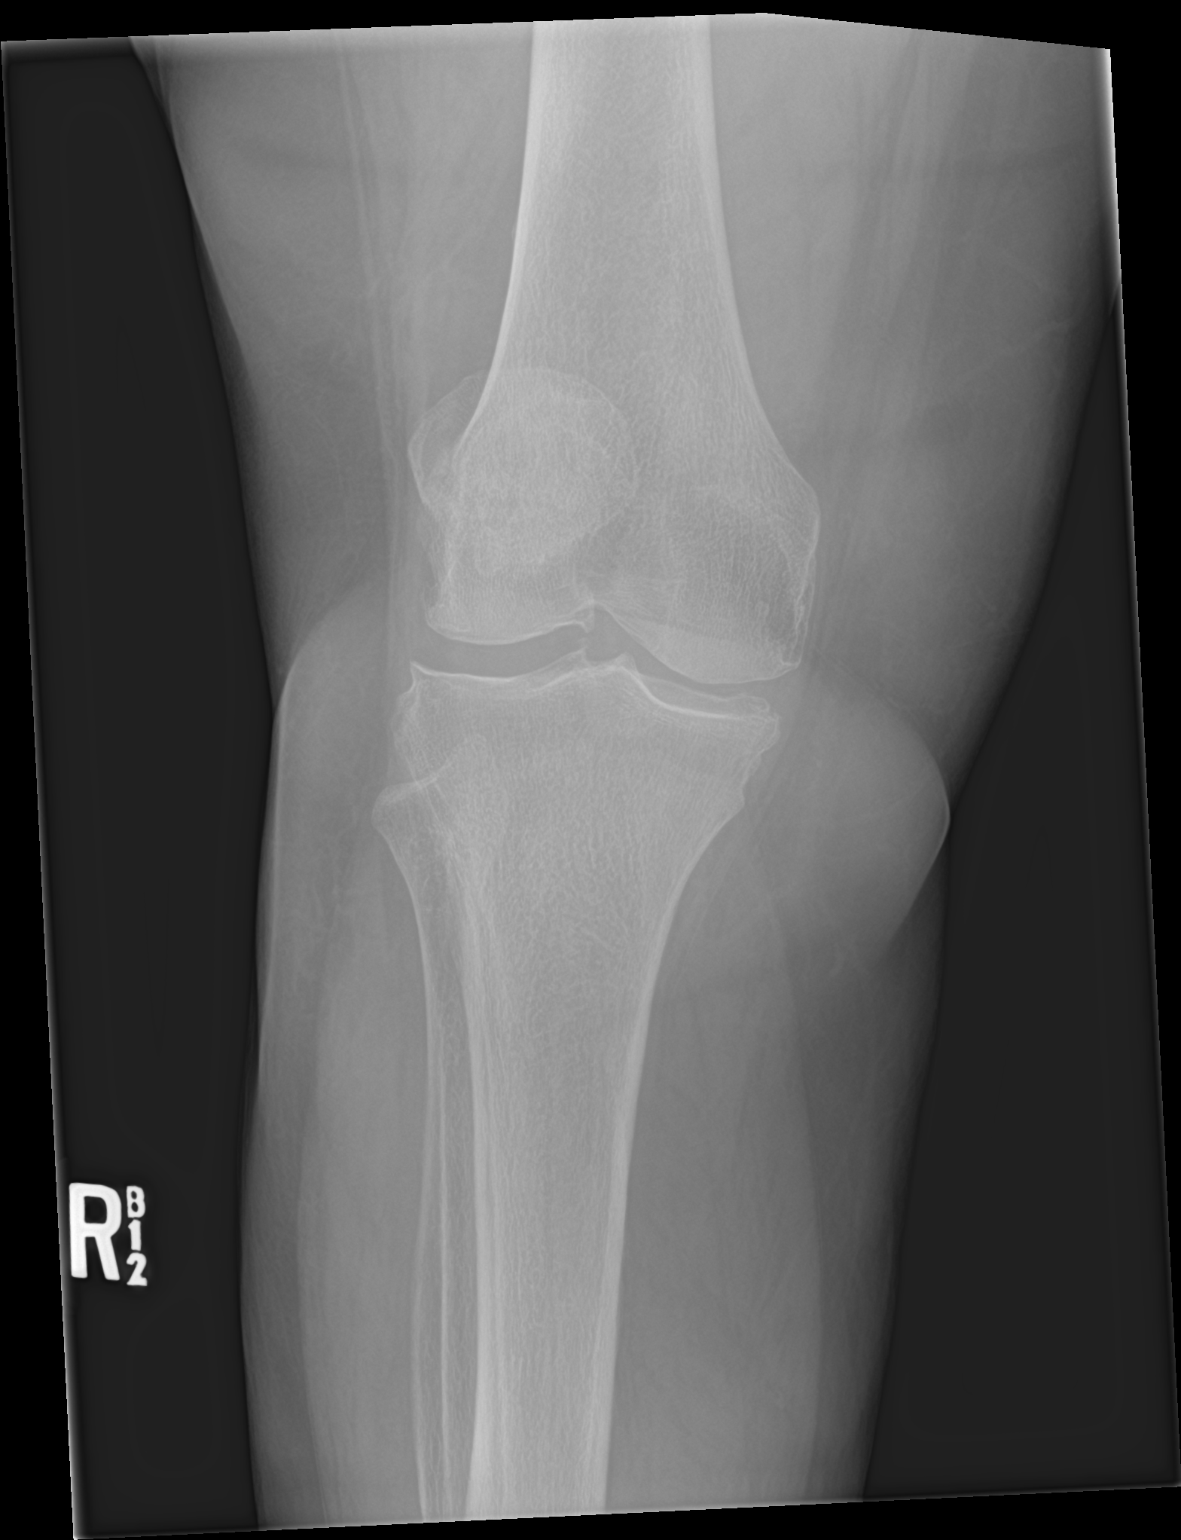

[knee lat]
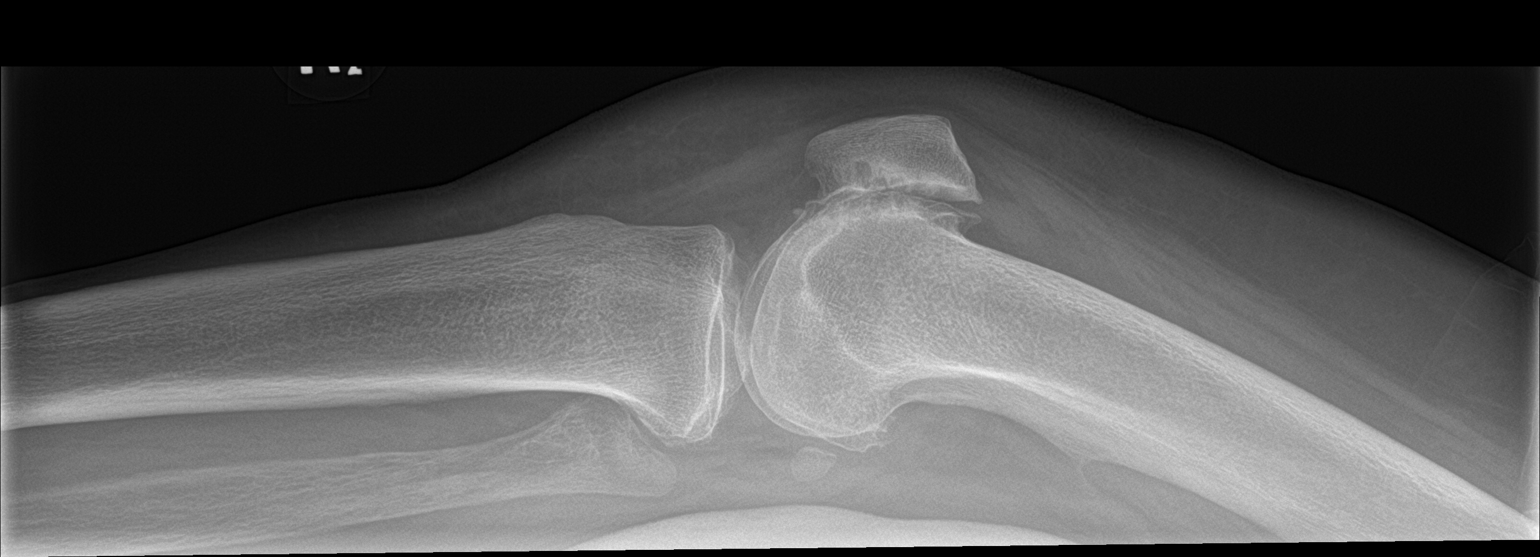

[2 of 2 positions shown; findings below may reference images not displayed]

FINDINGS: No definite acute fracture or dislocation is identified.
Irregularity of the medial aspect of the medial tibial plateau is
favored to be due to degenerative spurring without a displaced
fracture or knee joint effusion identified. Mild lateral compartment
spurring is also present, and there is advanced patellofemoral
osteoarthrosis with spurring and joint space narrowing. The soft
tissues are unremarkable.
IMPRESSION: Osteoarthrosis without definite acute osseous abnormality
identified.

## 2017-12-31 IMAGING — DX DG WRIST COMPLETE 3+V*L*
4 series · 4 of 4 positions shown · non-contrast
Comparison: None.

CLINICAL DATA: Fall.  Wrist pain

EXAM:
LEFT WRIST - COMPLETE 3+ VIEW

[wrist pa]
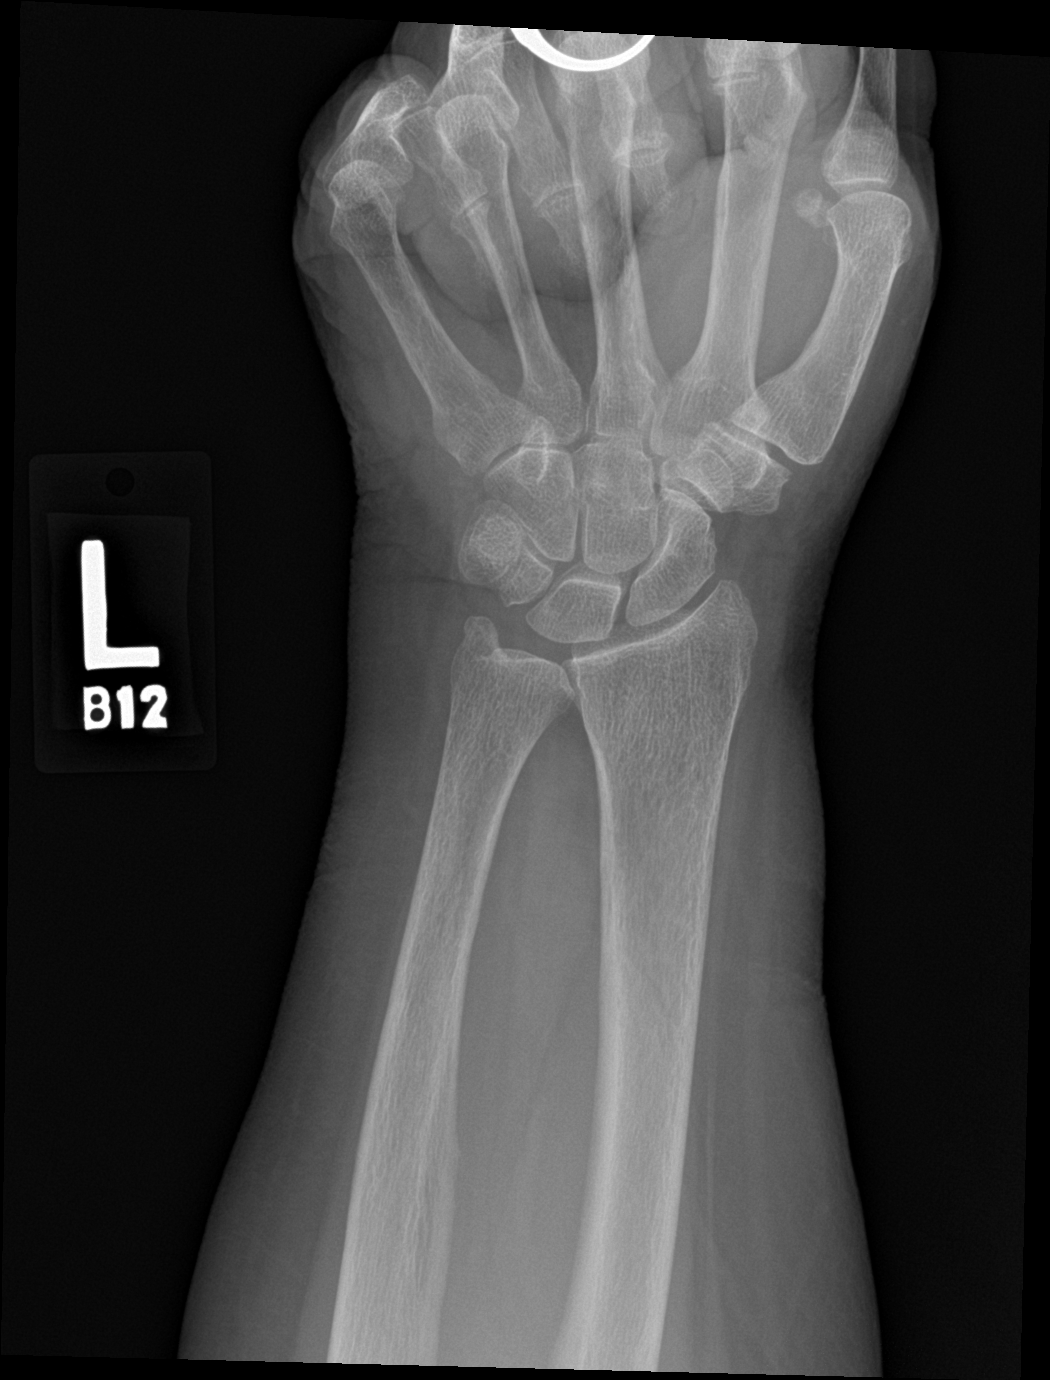

[wrist obl]
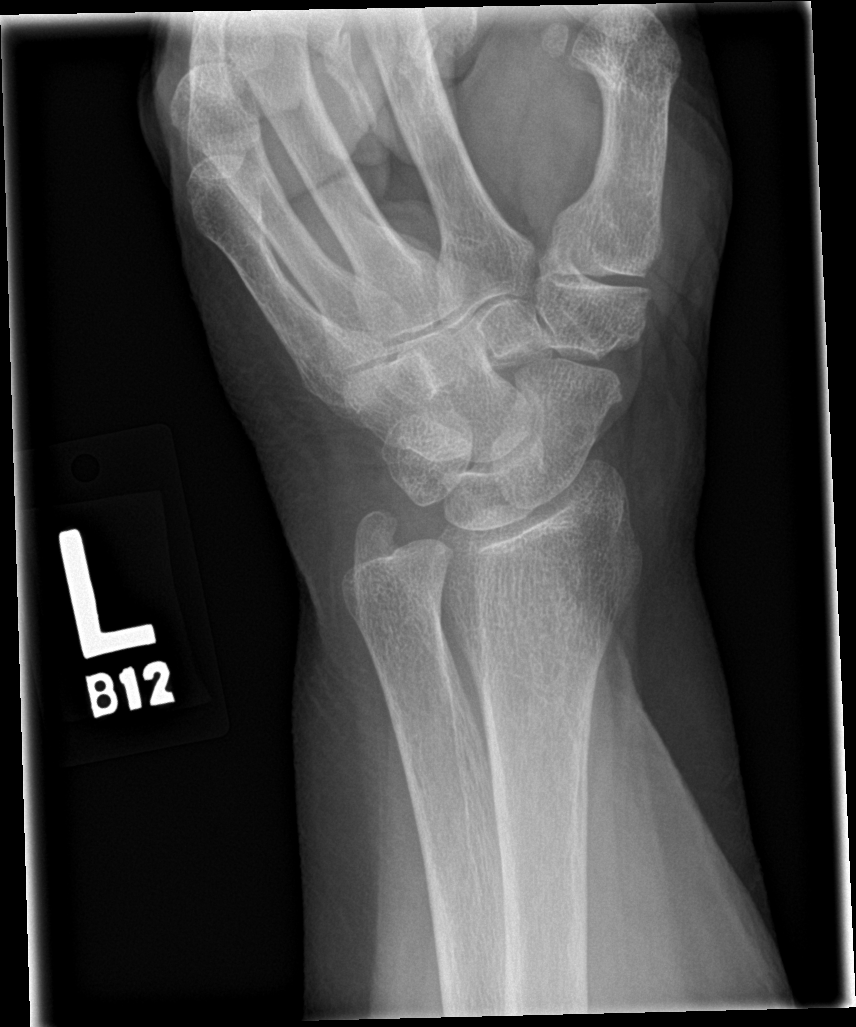

[wrist lat]
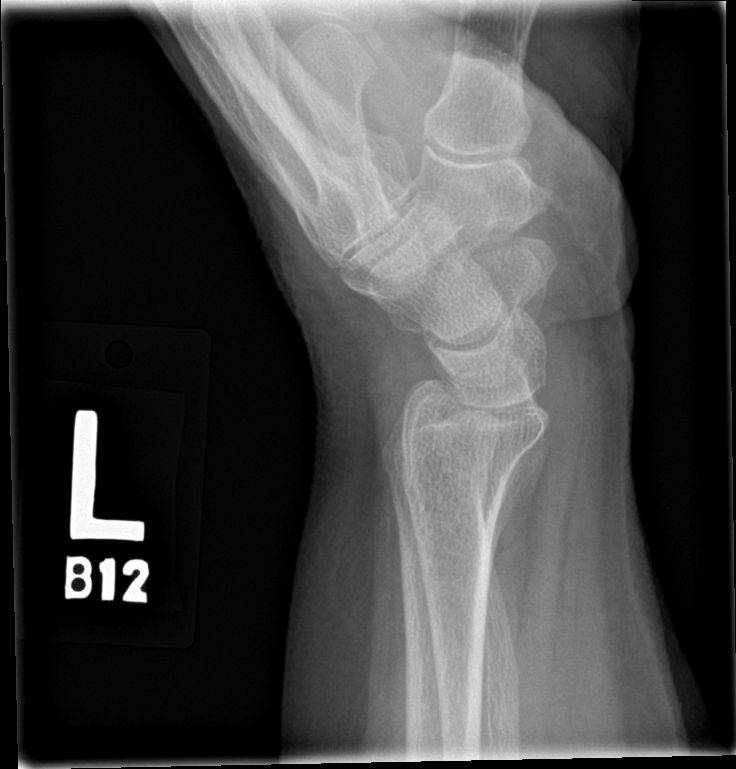

[wrist navicular]
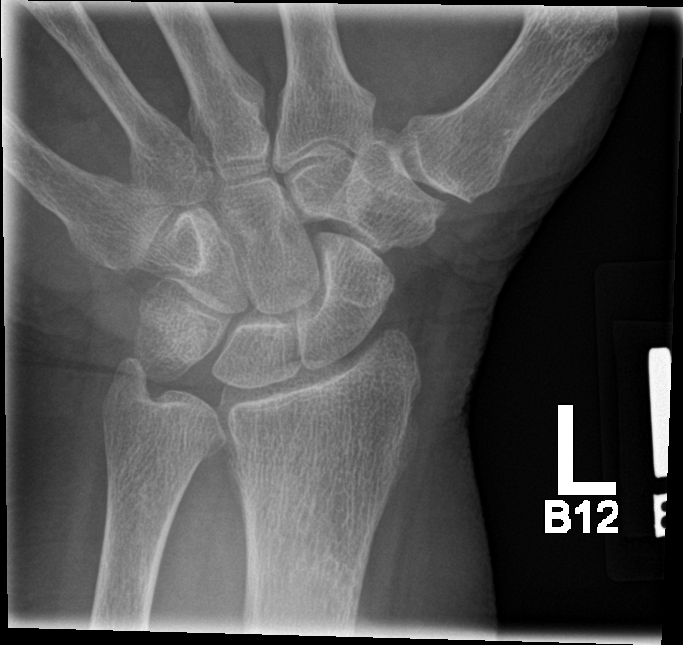

[4 of 4 positions shown; findings below may reference images not displayed]

FINDINGS: There is no evidence of fracture or dislocation. There is no
evidence of arthropathy or other focal bone abnormality. Soft
tissues are unremarkable.
IMPRESSION: Negative.

## 2017-12-31 MED ORDER — ACETAMINOPHEN 325 MG PO TABS
650.0000 mg | ORAL_TABLET | Freq: Once | ORAL | Status: AC
  Administered 2017-12-31: 650 mg via ORAL
  Filled 2017-12-31: qty 2

## 2017-12-31 NOTE — ED Notes (Signed)
ED Provider at bedside. 

## 2017-12-31 NOTE — ED Provider Notes (Signed)
[facility] EMERGENCY DEPARTMENT Provider Note   CSN: [id number] Arrival date & time: 12/31/17  1223     History   Chief Complaint Chief Complaint  Patient presents with  . Fall    HPI Mary Wilcox is a 62 y.o. female.  62 y/o female with a PMH of cervical CA, Iron defiency anemina presents to the ED via EMS s/p fall x 2 hours ago. Patient reports she was walking down the aisle at [facility] when she slipped in a dip on the floor. She reports she landed on her knee and left wrist. She reports pain on BL knees, L > R. She also reports her left wrist pain is worse with movement. She reports she hit her head on a shelf near by, but denies any blood thinner use. She states she has not taken anything for her pain. She denies any headache, vomiting, nausea, chest pain or shortness of breath.     Past Medical History:  Diagnosis Date  . Bilateral hydronephrosis 2006   due to obstruction from cervical cancer  . Cervical cancer ([place]) 2006   IIIB s/p ext radiation and intracavitary cessium  . Chronic kidney disease    hx several episodes of ARF secondary to obstructive uropathy  . Insomnia    takes Ambien 3x wk  . Iron deficiency anemia   . Peripheral vascular disease ([place])   . Secondary hyperparathyroidism (of renal origin)   . Transfusion history    02/2011 for Hgb 6.8    Patient Active Problem List   Diagnosis Date Noted  . Mechanical complication of other vascular device, implant, and graft 08/19/2011  . End stage renal disease ([place]) 08/05/2011  . Other complications due to renal dialysis device, implant, and graft 08/05/2011  . Secondary hyperparathyroidism (of renal origin)   . Anemia associated with chronic renal failure 03/06/2011  . Thrombocytosis ([employer]) 03/06/2011  . Chronic kidney disease, stage V requiring chronic dialysis ([place]) 03/05/2011  . Metabolic acidosis 62/70/3500  . Hydronephrosis, bilateral 03/05/2011  . Chronic UTI 03/05/2011  .  Leukocytosis 03/05/2011    Past Surgical History:  Procedure Laterality Date  . AV FISTULA PLACEMENT  03/05/2011   Procedure: INSERTION OF ARTERIOVENOUS (AV) GORE-TEX GRAFT ARM;  Surgeon: Angelia Mould, MD;  Location: [facility];  Service: Vascular;  Laterality: Left;  start 1115   finish 1250  . INSERTION OF DIALYSIS CATHETER  03/05/2011   Procedure: INSERTION OF DIALYSIS CATHETER;  Surgeon: Angelia Mould, MD;  Location: [facility];  Service: Vascular;  Laterality: Right;  start 1041- finish 1051  . TUBAL LIGATION    . URETER SURGERY     stent, Dr. Risa Grill for bilateral hydro     OB History   None      Home Medications    Prior to Admission medications   Medication Sig Start Date End Date Taking? Authorizing Provider  calcium acetate (PHOSLO) 667 MG capsule Take 667-1,334 mg by mouth 3 (three) times daily. two tablets with every meal and one with snacks 06/18/11   [provider]  ethyl chloride spray Apply 1 application topically. Use on dialysis days T, Th, Sat 05/19/11   [provider]  multivitamin (RENA-VIT) TABS tablet Take 1 tablet by mouth daily.      [provider]  zolpidem (AMBIEN) 5 MG tablet Take 5 mg by mouth at bedtime as needed. For sleep    [provider]    Family History Family History  Problem Relation Age of Onset  . Lung cancer Father   . Cancer Father   . Cancer Mother        kidney and thyroid  . Heart disease Brother   . Diabetes Brother   . Hyperlipidemia Brother   . Hypertension Brother   . Other Brother        varicose vein    Social History Social History   Tobacco Use  . Smoking status: Never Smoker  . Smokeless tobacco: Never Used  Substance Use Topics  . Alcohol use: No  . Drug use: No     Allergies   Prozac [fluoxetine hcl]   Review of Systems Review of Systems  Constitutional: Negative for chills and fever.  Respiratory: Negative for shortness of breath.   Cardiovascular:  Negative for chest pain.  Gastrointestinal: Negative for abdominal pain.  Genitourinary: Negative for flank pain.  Musculoskeletal: Positive for arthralgias, joint swelling and myalgias. Negative for neck pain and neck stiffness.  Skin: Positive for color change. Negative for pallor and wound.     Physical Exam Updated Vital Signs BP (!) 125/58   Pulse (!) 55   Temp 98.3 F (36.8 C) (Oral)   Resp 20   SpO2 96%   Physical Exam  Constitutional: She is oriented to person, place, and time. She appears well-developed and well-nourished.  HENT:  Head: Normocephalic and atraumatic.  Eyes: Pupils are equal, round, and reactive to light.  Neck: Normal range of motion. Neck supple.  Cardiovascular: Normal heart sounds.  Pulmonary/Chest: Breath sounds normal.  Abdominal: Soft. There is no tenderness.  Musculoskeletal: She exhibits edema and tenderness.       Right shoulder: She exhibits pain. She exhibits no tenderness, no bony tenderness and no swelling.       Left wrist: She exhibits tenderness.       Right knee: She exhibits swelling, ecchymosis and erythema. She exhibits normal range of motion, no effusion, no deformity, no laceration, normal alignment and no LCL laxity.       Left knee: She exhibits swelling, effusion, ecchymosis and erythema. She exhibits normal range of motion, no deformity, no laceration, normal alignment and no LCL laxity.       Arms: Ecchymosis noted to bilateral knees, left > right.Pulses present. Neurologically intact.  No snuffbox tenderness.  Neurological: She is alert and oriented to person, place, and time.  Skin: Skin is warm and dry. There is erythema.  Nursing note and vitals reviewed.    ED Treatments / Results  Labs (all labs ordered are listed, but only abnormal results are displayed) Labs Reviewed - No data to display  EKG None  Radiology Dg Wrist Complete Left  Result Date: 12/31/2017 CLINICAL DATA:  Fall.  Wrist pain EXAM: LEFT WRIST -  COMPLETE 3+ VIEW COMPARISON:  None. FINDINGS: There is no evidence of fracture or dislocation. There is no evidence of arthropathy or other focal bone abnormality. Soft tissues are unremarkable. IMPRESSION: Negative. Electronically Signed   By: Franchot Gallo M.D.   On: 12/31/2017 15:07   Dg Knee 2 Views Left  Result Date: 12/31/2017 CLINICAL DATA:  Left knee pain due to an injury in a trip and fall today. Initial encounter. EXAM: LEFT KNEE - 1-2 VIEW COMPARISON:  None. FINDINGS: No acute bony or joint abnormality is identified. Small joint effusion is noted. The patient has advanced patellofemoral osteoarthritis. IMPRESSION: No acute finding. Advanced patellofemoral osteoarthritis. Electronically Signed   By: Inge Rise M.D.  On: 12/31/2017 15:04   Dg Knee 2 Views Right  Result Date: 12/31/2017 CLINICAL DATA:  Fall.  Knee pain/tingling. EXAM: RIGHT KNEE - 1-2 VIEW COMPARISON:  None. FINDINGS: No definite acute fracture or dislocation is identified. Irregularity of the medial aspect of the medial tibial plateau is favored to be due to degenerative spurring without a displaced fracture or knee joint effusion identified. Mild lateral compartment spurring is also present, and there is advanced patellofemoral osteoarthrosis with spurring and joint space narrowing. The soft tissues are unremarkable. IMPRESSION: Osteoarthrosis without definite acute osseous abnormality identified. Electronically Signed   By: Logan Bores M.D.   On: 12/31/2017 15:07    Procedures Procedures (including critical care time)  Medications Ordered in ED Medications  acetaminophen (TYLENOL) tablet 650 mg (650 mg Oral Given 12/31/17 1423)     Initial Impression / Assessment and Plan / ED Course  I have reviewed the triage vital signs and the nursing notes.  Pertinent labs & imaging results that were available during my care of the patient were reviewed by me and considered in my medical decision making (see chart  for details).    [place] [employer]  There is ecchymosis to bilateral knees, left wrist abrasion noted.  Will order imaging to rule out any fractures or dislocation.  Patient given Tylenol for pain relief. Per EMS security video fall shows the patient tripping over a small dip in the ground.  EMS patient was ambulatory on scene and patient was able to move herself over on the ED stretcher.  DG left knee showed no joint effusion.  No acute finding of a fracture, dislocation.  DG right knee showed no acute abnormality identified, no acute fracture or dislocation.  Left wrist was negative for any acute fracture or dislocation.  Soft tissues are unremarkable. Talking to patient she reports she did hit her head, she is neurologically intact denies  vomiting, nausea, or LOC.  CT [place] rule CT not necessary at this time. At this time I will provide patient with a knee sleeve for her left knee as it looks swollen, she needs to apply ice and take some Tylenol for her pain.  Patient should follow-up with her PCP in 1 week to reevaluate her symptoms.  Patient understands and agrees with plan.  Vitals stable during ED visit, patient stable for discharge.  Final Clinical Impressions(s) / ED Diagnoses   Final diagnoses:  Fall, initial encounter  Effusion, left knee  Acute pain of right knee  Left wrist pain    ED Discharge Orders    None       Janeece Fitting, PA-C 12/31/17 1525    Daleen Bo, MD 12/31/17 2110

## 2017-12-31 NOTE — ED Notes (Signed)
Pt using wrist and legs without evidence that is is hurting her. Texting and holding phone- bending knees.

## 2017-12-31 NOTE — Discharge Instructions (Addendum)
I have provided a knee sleeve please use for your comfort. You may take tylenol in order to treat your pain.You can also apply ICE to your knees when sitting down at home, elevation recommended as well. Please follow up with your PCP in 1 week for reevaluation of your symptoms.

## 2017-12-31 NOTE — ED Notes (Signed)
Patient transported to X-ray 

## 2017-12-31 NOTE — ED Triage Notes (Signed)
[place] by [facility] [place] [employer] for a [place] fall. Per EMS the security video of the fall shows the patient tripping over a small dip in the ground. Pt c/o left wrist and knee pain, right leg tingling, and lower back pain. Pt c/o 12/10 pain. Per EMS pt was ambulatory on scene, pt was able to move herself over on the ED stretcher. Pt A+Ox4.

## 2017-12-31 NOTE — ED Notes (Signed)
Patient verbalizes understanding of discharge instructions. Opportunity for questioning and answers were provided. Armband removed by staff, pt discharged from ED.  

## 2018-01-01 DIAGNOSIS — N186 End stage renal disease: Secondary | ICD-10-CM | POA: Diagnosis not present

## 2018-01-01 DIAGNOSIS — N2581 Secondary hyperparathyroidism of renal origin: Secondary | ICD-10-CM | POA: Diagnosis not present

## 2018-01-04 DIAGNOSIS — N186 End stage renal disease: Secondary | ICD-10-CM | POA: Diagnosis not present

## 2018-01-04 DIAGNOSIS — N2581 Secondary hyperparathyroidism of renal origin: Secondary | ICD-10-CM | POA: Diagnosis not present

## 2018-01-06 DIAGNOSIS — N2581 Secondary hyperparathyroidism of renal origin: Secondary | ICD-10-CM | POA: Diagnosis not present

## 2018-01-06 DIAGNOSIS — N186 End stage renal disease: Secondary | ICD-10-CM | POA: Diagnosis not present

## 2018-01-08 DIAGNOSIS — N2581 Secondary hyperparathyroidism of renal origin: Secondary | ICD-10-CM | POA: Diagnosis not present

## 2018-01-08 DIAGNOSIS — N186 End stage renal disease: Secondary | ICD-10-CM | POA: Diagnosis not present

## 2018-01-11 DIAGNOSIS — N2581 Secondary hyperparathyroidism of renal origin: Secondary | ICD-10-CM | POA: Diagnosis not present

## 2018-01-11 DIAGNOSIS — N186 End stage renal disease: Secondary | ICD-10-CM | POA: Diagnosis not present

## 2018-01-13 DIAGNOSIS — N2581 Secondary hyperparathyroidism of renal origin: Secondary | ICD-10-CM | POA: Diagnosis not present

## 2018-01-13 DIAGNOSIS — N186 End stage renal disease: Secondary | ICD-10-CM | POA: Diagnosis not present

## 2018-01-14 DIAGNOSIS — N139 Obstructive and reflux uropathy, unspecified: Secondary | ICD-10-CM | POA: Diagnosis not present

## 2018-01-14 DIAGNOSIS — N186 End stage renal disease: Secondary | ICD-10-CM | POA: Diagnosis not present

## 2018-01-14 DIAGNOSIS — F5101 Primary insomnia: Secondary | ICD-10-CM | POA: Diagnosis not present

## 2018-01-14 DIAGNOSIS — Z992 Dependence on renal dialysis: Secondary | ICD-10-CM | POA: Diagnosis not present

## 2018-01-14 DIAGNOSIS — F332 Major depressive disorder, recurrent severe without psychotic features: Secondary | ICD-10-CM | POA: Diagnosis not present

## 2018-01-14 DIAGNOSIS — M25562 Pain in left knee: Secondary | ICD-10-CM | POA: Diagnosis not present

## 2018-01-14 DIAGNOSIS — M174 Other bilateral secondary osteoarthritis of knee: Secondary | ICD-10-CM | POA: Diagnosis not present

## 2018-01-15 DIAGNOSIS — N186 End stage renal disease: Secondary | ICD-10-CM | POA: Diagnosis not present

## 2018-01-15 DIAGNOSIS — N2581 Secondary hyperparathyroidism of renal origin: Secondary | ICD-10-CM | POA: Diagnosis not present

## 2018-01-18 DIAGNOSIS — N2581 Secondary hyperparathyroidism of renal origin: Secondary | ICD-10-CM | POA: Diagnosis not present

## 2018-01-18 DIAGNOSIS — N186 End stage renal disease: Secondary | ICD-10-CM | POA: Diagnosis not present

## 2018-01-20 DIAGNOSIS — N2581 Secondary hyperparathyroidism of renal origin: Secondary | ICD-10-CM | POA: Diagnosis not present

## 2018-01-20 DIAGNOSIS — N186 End stage renal disease: Secondary | ICD-10-CM | POA: Diagnosis not present

## 2018-01-22 DIAGNOSIS — N2581 Secondary hyperparathyroidism of renal origin: Secondary | ICD-10-CM | POA: Diagnosis not present

## 2018-01-22 DIAGNOSIS — N186 End stage renal disease: Secondary | ICD-10-CM | POA: Diagnosis not present

## 2018-01-25 DIAGNOSIS — N186 End stage renal disease: Secondary | ICD-10-CM | POA: Diagnosis not present

## 2018-01-25 DIAGNOSIS — N2581 Secondary hyperparathyroidism of renal origin: Secondary | ICD-10-CM | POA: Diagnosis not present

## 2018-01-27 DIAGNOSIS — N186 End stage renal disease: Secondary | ICD-10-CM | POA: Diagnosis not present

## 2018-01-27 DIAGNOSIS — N2581 Secondary hyperparathyroidism of renal origin: Secondary | ICD-10-CM | POA: Diagnosis not present

## 2018-01-29 DIAGNOSIS — N186 End stage renal disease: Secondary | ICD-10-CM | POA: Diagnosis not present

## 2018-01-29 DIAGNOSIS — N2581 Secondary hyperparathyroidism of renal origin: Secondary | ICD-10-CM | POA: Diagnosis not present

## 2018-02-01 DIAGNOSIS — N186 End stage renal disease: Secondary | ICD-10-CM | POA: Diagnosis not present

## 2018-02-01 DIAGNOSIS — N2581 Secondary hyperparathyroidism of renal origin: Secondary | ICD-10-CM | POA: Diagnosis not present

## 2018-02-02 DIAGNOSIS — M25561 Pain in right knee: Secondary | ICD-10-CM | POA: Diagnosis not present

## 2018-02-02 DIAGNOSIS — M25562 Pain in left knee: Secondary | ICD-10-CM | POA: Diagnosis not present

## 2018-02-02 DIAGNOSIS — M5417 Radiculopathy, lumbosacral region: Secondary | ICD-10-CM | POA: Diagnosis not present

## 2018-02-02 DIAGNOSIS — M79661 Pain in right lower leg: Secondary | ICD-10-CM | POA: Diagnosis not present

## 2018-02-03 DIAGNOSIS — N2581 Secondary hyperparathyroidism of renal origin: Secondary | ICD-10-CM | POA: Diagnosis not present

## 2018-02-03 DIAGNOSIS — N186 End stage renal disease: Secondary | ICD-10-CM | POA: Diagnosis not present

## 2018-02-04 DIAGNOSIS — M5417 Radiculopathy, lumbosacral region: Secondary | ICD-10-CM | POA: Diagnosis not present

## 2018-02-04 DIAGNOSIS — M79661 Pain in right lower leg: Secondary | ICD-10-CM | POA: Diagnosis not present

## 2018-02-04 DIAGNOSIS — M25562 Pain in left knee: Secondary | ICD-10-CM | POA: Diagnosis not present

## 2018-02-04 DIAGNOSIS — M25561 Pain in right knee: Secondary | ICD-10-CM | POA: Diagnosis not present

## 2018-02-05 DIAGNOSIS — N186 End stage renal disease: Secondary | ICD-10-CM | POA: Diagnosis not present

## 2018-02-05 DIAGNOSIS — N2581 Secondary hyperparathyroidism of renal origin: Secondary | ICD-10-CM | POA: Diagnosis not present

## 2018-02-06 DIAGNOSIS — M5417 Radiculopathy, lumbosacral region: Secondary | ICD-10-CM | POA: Diagnosis not present

## 2018-02-06 DIAGNOSIS — M25561 Pain in right knee: Secondary | ICD-10-CM | POA: Diagnosis not present

## 2018-02-06 DIAGNOSIS — M25562 Pain in left knee: Secondary | ICD-10-CM | POA: Diagnosis not present

## 2018-02-06 DIAGNOSIS — M79661 Pain in right lower leg: Secondary | ICD-10-CM | POA: Diagnosis not present

## 2018-02-07 DIAGNOSIS — N2581 Secondary hyperparathyroidism of renal origin: Secondary | ICD-10-CM | POA: Diagnosis not present

## 2018-02-07 DIAGNOSIS — N186 End stage renal disease: Secondary | ICD-10-CM | POA: Diagnosis not present

## 2018-02-08 DIAGNOSIS — M79661 Pain in right lower leg: Secondary | ICD-10-CM | POA: Diagnosis not present

## 2018-02-08 DIAGNOSIS — M5417 Radiculopathy, lumbosacral region: Secondary | ICD-10-CM | POA: Diagnosis not present

## 2018-02-08 DIAGNOSIS — M25561 Pain in right knee: Secondary | ICD-10-CM | POA: Diagnosis not present

## 2018-02-08 DIAGNOSIS — M25562 Pain in left knee: Secondary | ICD-10-CM | POA: Diagnosis not present

## 2018-02-09 DIAGNOSIS — N186 End stage renal disease: Secondary | ICD-10-CM | POA: Diagnosis not present

## 2018-02-09 DIAGNOSIS — N2581 Secondary hyperparathyroidism of renal origin: Secondary | ICD-10-CM | POA: Diagnosis not present

## 2018-02-12 DIAGNOSIS — N2581 Secondary hyperparathyroidism of renal origin: Secondary | ICD-10-CM | POA: Diagnosis not present

## 2018-02-12 DIAGNOSIS — N186 End stage renal disease: Secondary | ICD-10-CM | POA: Diagnosis not present

## 2018-02-13 DIAGNOSIS — N139 Obstructive and reflux uropathy, unspecified: Secondary | ICD-10-CM | POA: Diagnosis not present

## 2018-02-13 DIAGNOSIS — Z992 Dependence on renal dialysis: Secondary | ICD-10-CM | POA: Diagnosis not present

## 2018-02-13 DIAGNOSIS — N186 End stage renal disease: Secondary | ICD-10-CM | POA: Diagnosis not present

## 2018-02-14 DIAGNOSIS — M25561 Pain in right knee: Secondary | ICD-10-CM | POA: Diagnosis not present

## 2018-02-14 DIAGNOSIS — M25562 Pain in left knee: Secondary | ICD-10-CM | POA: Diagnosis not present

## 2018-02-14 DIAGNOSIS — M79661 Pain in right lower leg: Secondary | ICD-10-CM | POA: Diagnosis not present

## 2018-02-14 DIAGNOSIS — M5417 Radiculopathy, lumbosacral region: Secondary | ICD-10-CM | POA: Diagnosis not present

## 2018-02-16 DIAGNOSIS — N186 End stage renal disease: Secondary | ICD-10-CM | POA: Diagnosis not present

## 2018-02-16 DIAGNOSIS — F411 Generalized anxiety disorder: Secondary | ICD-10-CM | POA: Diagnosis not present

## 2018-02-16 DIAGNOSIS — F5101 Primary insomnia: Secondary | ICD-10-CM | POA: Diagnosis not present

## 2018-02-16 DIAGNOSIS — M5417 Radiculopathy, lumbosacral region: Secondary | ICD-10-CM | POA: Diagnosis not present

## 2018-02-16 DIAGNOSIS — H6120 Impacted cerumen, unspecified ear: Secondary | ICD-10-CM | POA: Diagnosis not present

## 2018-02-16 DIAGNOSIS — M25562 Pain in left knee: Secondary | ICD-10-CM | POA: Diagnosis not present

## 2018-02-16 DIAGNOSIS — F324 Major depressive disorder, single episode, in partial remission: Secondary | ICD-10-CM | POA: Diagnosis not present

## 2018-02-16 DIAGNOSIS — M25561 Pain in right knee: Secondary | ICD-10-CM | POA: Diagnosis not present

## 2018-02-16 DIAGNOSIS — H269 Unspecified cataract: Secondary | ICD-10-CM | POA: Diagnosis not present

## 2018-02-16 DIAGNOSIS — Z992 Dependence on renal dialysis: Secondary | ICD-10-CM | POA: Diagnosis not present

## 2018-02-16 DIAGNOSIS — M79661 Pain in right lower leg: Secondary | ICD-10-CM | POA: Diagnosis not present

## 2018-02-17 DIAGNOSIS — E039 Hypothyroidism, unspecified: Secondary | ICD-10-CM | POA: Diagnosis not present

## 2018-02-17 DIAGNOSIS — N2581 Secondary hyperparathyroidism of renal origin: Secondary | ICD-10-CM | POA: Diagnosis not present

## 2018-02-17 DIAGNOSIS — N186 End stage renal disease: Secondary | ICD-10-CM | POA: Diagnosis not present

## 2018-02-19 DIAGNOSIS — N2581 Secondary hyperparathyroidism of renal origin: Secondary | ICD-10-CM | POA: Diagnosis not present

## 2018-02-19 DIAGNOSIS — N186 End stage renal disease: Secondary | ICD-10-CM | POA: Diagnosis not present

## 2018-02-21 DIAGNOSIS — M25561 Pain in right knee: Secondary | ICD-10-CM | POA: Diagnosis not present

## 2018-02-21 DIAGNOSIS — M25562 Pain in left knee: Secondary | ICD-10-CM | POA: Diagnosis not present

## 2018-02-21 DIAGNOSIS — M5417 Radiculopathy, lumbosacral region: Secondary | ICD-10-CM | POA: Diagnosis not present

## 2018-02-21 DIAGNOSIS — M79661 Pain in right lower leg: Secondary | ICD-10-CM | POA: Diagnosis not present

## 2018-02-22 DIAGNOSIS — N186 End stage renal disease: Secondary | ICD-10-CM | POA: Diagnosis not present

## 2018-02-22 DIAGNOSIS — N2581 Secondary hyperparathyroidism of renal origin: Secondary | ICD-10-CM | POA: Diagnosis not present

## 2018-02-24 DIAGNOSIS — N2581 Secondary hyperparathyroidism of renal origin: Secondary | ICD-10-CM | POA: Diagnosis not present

## 2018-02-24 DIAGNOSIS — N186 End stage renal disease: Secondary | ICD-10-CM | POA: Diagnosis not present

## 2018-02-25 DIAGNOSIS — M25562 Pain in left knee: Secondary | ICD-10-CM | POA: Diagnosis not present

## 2018-02-25 DIAGNOSIS — M5417 Radiculopathy, lumbosacral region: Secondary | ICD-10-CM | POA: Diagnosis not present

## 2018-02-25 DIAGNOSIS — M25561 Pain in right knee: Secondary | ICD-10-CM | POA: Diagnosis not present

## 2018-02-25 DIAGNOSIS — M79661 Pain in right lower leg: Secondary | ICD-10-CM | POA: Diagnosis not present

## 2018-02-26 DIAGNOSIS — N186 End stage renal disease: Secondary | ICD-10-CM | POA: Diagnosis not present

## 2018-02-26 DIAGNOSIS — N2581 Secondary hyperparathyroidism of renal origin: Secondary | ICD-10-CM | POA: Diagnosis not present

## 2018-02-28 DIAGNOSIS — M5417 Radiculopathy, lumbosacral region: Secondary | ICD-10-CM | POA: Diagnosis not present

## 2018-02-28 DIAGNOSIS — M25561 Pain in right knee: Secondary | ICD-10-CM | POA: Diagnosis not present

## 2018-02-28 DIAGNOSIS — M79661 Pain in right lower leg: Secondary | ICD-10-CM | POA: Diagnosis not present

## 2018-02-28 DIAGNOSIS — M25562 Pain in left knee: Secondary | ICD-10-CM | POA: Diagnosis not present

## 2018-03-01 DIAGNOSIS — N2581 Secondary hyperparathyroidism of renal origin: Secondary | ICD-10-CM | POA: Diagnosis not present

## 2018-03-01 DIAGNOSIS — N186 End stage renal disease: Secondary | ICD-10-CM | POA: Diagnosis not present

## 2018-03-03 DIAGNOSIS — N186 End stage renal disease: Secondary | ICD-10-CM | POA: Diagnosis not present

## 2018-03-03 DIAGNOSIS — N2581 Secondary hyperparathyroidism of renal origin: Secondary | ICD-10-CM | POA: Diagnosis not present

## 2018-03-04 DIAGNOSIS — M25561 Pain in right knee: Secondary | ICD-10-CM | POA: Diagnosis not present

## 2018-03-04 DIAGNOSIS — M79661 Pain in right lower leg: Secondary | ICD-10-CM | POA: Diagnosis not present

## 2018-03-04 DIAGNOSIS — M25562 Pain in left knee: Secondary | ICD-10-CM | POA: Diagnosis not present

## 2018-03-04 DIAGNOSIS — M5417 Radiculopathy, lumbosacral region: Secondary | ICD-10-CM | POA: Diagnosis not present

## 2018-03-05 DIAGNOSIS — N186 End stage renal disease: Secondary | ICD-10-CM | POA: Diagnosis not present

## 2018-03-05 DIAGNOSIS — N2581 Secondary hyperparathyroidism of renal origin: Secondary | ICD-10-CM | POA: Diagnosis not present

## 2018-03-06 DIAGNOSIS — M79661 Pain in right lower leg: Secondary | ICD-10-CM | POA: Diagnosis not present

## 2018-03-06 DIAGNOSIS — M25562 Pain in left knee: Secondary | ICD-10-CM | POA: Diagnosis not present

## 2018-03-06 DIAGNOSIS — M25561 Pain in right knee: Secondary | ICD-10-CM | POA: Diagnosis not present

## 2018-03-06 DIAGNOSIS — M5417 Radiculopathy, lumbosacral region: Secondary | ICD-10-CM | POA: Diagnosis not present

## 2018-03-07 DIAGNOSIS — N2581 Secondary hyperparathyroidism of renal origin: Secondary | ICD-10-CM | POA: Diagnosis not present

## 2018-03-07 DIAGNOSIS — N186 End stage renal disease: Secondary | ICD-10-CM | POA: Diagnosis not present

## 2018-03-10 DIAGNOSIS — N2581 Secondary hyperparathyroidism of renal origin: Secondary | ICD-10-CM | POA: Diagnosis not present

## 2018-03-10 DIAGNOSIS — N186 End stage renal disease: Secondary | ICD-10-CM | POA: Diagnosis not present

## 2018-03-12 DIAGNOSIS — N186 End stage renal disease: Secondary | ICD-10-CM | POA: Diagnosis not present

## 2018-03-12 DIAGNOSIS — N2581 Secondary hyperparathyroidism of renal origin: Secondary | ICD-10-CM | POA: Diagnosis not present

## 2018-03-14 DIAGNOSIS — N2581 Secondary hyperparathyroidism of renal origin: Secondary | ICD-10-CM | POA: Diagnosis not present

## 2018-03-14 DIAGNOSIS — N186 End stage renal disease: Secondary | ICD-10-CM | POA: Diagnosis not present

## 2018-03-15 DIAGNOSIS — M79661 Pain in right lower leg: Secondary | ICD-10-CM | POA: Diagnosis not present

## 2018-03-15 DIAGNOSIS — M25561 Pain in right knee: Secondary | ICD-10-CM | POA: Diagnosis not present

## 2018-03-15 DIAGNOSIS — M5417 Radiculopathy, lumbosacral region: Secondary | ICD-10-CM | POA: Diagnosis not present

## 2018-03-15 DIAGNOSIS — M25562 Pain in left knee: Secondary | ICD-10-CM | POA: Diagnosis not present

## 2018-03-16 DIAGNOSIS — N139 Obstructive and reflux uropathy, unspecified: Secondary | ICD-10-CM | POA: Diagnosis not present

## 2018-03-16 DIAGNOSIS — Z992 Dependence on renal dialysis: Secondary | ICD-10-CM | POA: Diagnosis not present

## 2018-03-16 DIAGNOSIS — N186 End stage renal disease: Secondary | ICD-10-CM | POA: Diagnosis not present

## 2018-03-17 DIAGNOSIS — N186 End stage renal disease: Secondary | ICD-10-CM | POA: Diagnosis not present

## 2018-03-17 DIAGNOSIS — N2581 Secondary hyperparathyroidism of renal origin: Secondary | ICD-10-CM | POA: Diagnosis not present

## 2018-03-18 DIAGNOSIS — M5417 Radiculopathy, lumbosacral region: Secondary | ICD-10-CM | POA: Diagnosis not present

## 2018-03-18 DIAGNOSIS — M25561 Pain in right knee: Secondary | ICD-10-CM | POA: Diagnosis not present

## 2018-03-18 DIAGNOSIS — M25562 Pain in left knee: Secondary | ICD-10-CM | POA: Diagnosis not present

## 2018-03-18 DIAGNOSIS — M79661 Pain in right lower leg: Secondary | ICD-10-CM | POA: Diagnosis not present

## 2018-03-19 DIAGNOSIS — N186 End stage renal disease: Secondary | ICD-10-CM | POA: Diagnosis not present

## 2018-03-19 DIAGNOSIS — N2581 Secondary hyperparathyroidism of renal origin: Secondary | ICD-10-CM | POA: Diagnosis not present

## 2018-03-22 DIAGNOSIS — N2581 Secondary hyperparathyroidism of renal origin: Secondary | ICD-10-CM | POA: Diagnosis not present

## 2018-03-22 DIAGNOSIS — N186 End stage renal disease: Secondary | ICD-10-CM | POA: Diagnosis not present

## 2018-03-24 DIAGNOSIS — N186 End stage renal disease: Secondary | ICD-10-CM | POA: Diagnosis not present

## 2018-03-24 DIAGNOSIS — N2581 Secondary hyperparathyroidism of renal origin: Secondary | ICD-10-CM | POA: Diagnosis not present

## 2018-03-26 DIAGNOSIS — N2581 Secondary hyperparathyroidism of renal origin: Secondary | ICD-10-CM | POA: Diagnosis not present

## 2018-03-26 DIAGNOSIS — N186 End stage renal disease: Secondary | ICD-10-CM | POA: Diagnosis not present

## 2018-03-29 DIAGNOSIS — N2581 Secondary hyperparathyroidism of renal origin: Secondary | ICD-10-CM | POA: Diagnosis not present

## 2018-03-29 DIAGNOSIS — N186 End stage renal disease: Secondary | ICD-10-CM | POA: Diagnosis not present

## 2018-03-31 DIAGNOSIS — N2581 Secondary hyperparathyroidism of renal origin: Secondary | ICD-10-CM | POA: Diagnosis not present

## 2018-03-31 DIAGNOSIS — N186 End stage renal disease: Secondary | ICD-10-CM | POA: Diagnosis not present

## 2018-04-01 DIAGNOSIS — M25562 Pain in left knee: Secondary | ICD-10-CM | POA: Diagnosis not present

## 2018-04-01 DIAGNOSIS — M5417 Radiculopathy, lumbosacral region: Secondary | ICD-10-CM | POA: Diagnosis not present

## 2018-04-01 DIAGNOSIS — M79661 Pain in right lower leg: Secondary | ICD-10-CM | POA: Diagnosis not present

## 2018-04-01 DIAGNOSIS — M25561 Pain in right knee: Secondary | ICD-10-CM | POA: Diagnosis not present

## 2018-04-02 DIAGNOSIS — N186 End stage renal disease: Secondary | ICD-10-CM | POA: Diagnosis not present

## 2018-04-02 DIAGNOSIS — N2581 Secondary hyperparathyroidism of renal origin: Secondary | ICD-10-CM | POA: Diagnosis not present

## 2018-04-05 DIAGNOSIS — N186 End stage renal disease: Secondary | ICD-10-CM | POA: Diagnosis not present

## 2018-04-05 DIAGNOSIS — N2581 Secondary hyperparathyroidism of renal origin: Secondary | ICD-10-CM | POA: Diagnosis not present

## 2018-04-06 DIAGNOSIS — M25561 Pain in right knee: Secondary | ICD-10-CM | POA: Diagnosis not present

## 2018-04-06 DIAGNOSIS — M5417 Radiculopathy, lumbosacral region: Secondary | ICD-10-CM | POA: Diagnosis not present

## 2018-04-06 DIAGNOSIS — M79661 Pain in right lower leg: Secondary | ICD-10-CM | POA: Diagnosis not present

## 2018-04-06 DIAGNOSIS — M25562 Pain in left knee: Secondary | ICD-10-CM | POA: Diagnosis not present

## 2018-04-07 DIAGNOSIS — N2581 Secondary hyperparathyroidism of renal origin: Secondary | ICD-10-CM | POA: Diagnosis not present

## 2018-04-07 DIAGNOSIS — N186 End stage renal disease: Secondary | ICD-10-CM | POA: Diagnosis not present

## 2018-04-08 DIAGNOSIS — M25561 Pain in right knee: Secondary | ICD-10-CM | POA: Diagnosis not present

## 2018-04-08 DIAGNOSIS — M5417 Radiculopathy, lumbosacral region: Secondary | ICD-10-CM | POA: Diagnosis not present

## 2018-04-08 DIAGNOSIS — M79661 Pain in right lower leg: Secondary | ICD-10-CM | POA: Diagnosis not present

## 2018-04-08 DIAGNOSIS — M25562 Pain in left knee: Secondary | ICD-10-CM | POA: Diagnosis not present

## 2018-04-09 DIAGNOSIS — N186 End stage renal disease: Secondary | ICD-10-CM | POA: Diagnosis not present

## 2018-04-09 DIAGNOSIS — N2581 Secondary hyperparathyroidism of renal origin: Secondary | ICD-10-CM | POA: Diagnosis not present

## 2018-04-11 DIAGNOSIS — M25561 Pain in right knee: Secondary | ICD-10-CM | POA: Diagnosis not present

## 2018-04-11 DIAGNOSIS — M79661 Pain in right lower leg: Secondary | ICD-10-CM | POA: Diagnosis not present

## 2018-04-11 DIAGNOSIS — M5417 Radiculopathy, lumbosacral region: Secondary | ICD-10-CM | POA: Diagnosis not present

## 2018-04-11 DIAGNOSIS — M25562 Pain in left knee: Secondary | ICD-10-CM | POA: Diagnosis not present

## 2018-04-12 DIAGNOSIS — N186 End stage renal disease: Secondary | ICD-10-CM | POA: Diagnosis not present

## 2018-04-12 DIAGNOSIS — N2581 Secondary hyperparathyroidism of renal origin: Secondary | ICD-10-CM | POA: Diagnosis not present

## 2018-04-14 DIAGNOSIS — N2581 Secondary hyperparathyroidism of renal origin: Secondary | ICD-10-CM | POA: Diagnosis not present

## 2018-04-14 DIAGNOSIS — N186 End stage renal disease: Secondary | ICD-10-CM | POA: Diagnosis not present

## 2018-04-15 DIAGNOSIS — M79661 Pain in right lower leg: Secondary | ICD-10-CM | POA: Diagnosis not present

## 2018-04-15 DIAGNOSIS — M25561 Pain in right knee: Secondary | ICD-10-CM | POA: Diagnosis not present

## 2018-04-15 DIAGNOSIS — M25562 Pain in left knee: Secondary | ICD-10-CM | POA: Diagnosis not present

## 2018-04-15 DIAGNOSIS — M5417 Radiculopathy, lumbosacral region: Secondary | ICD-10-CM | POA: Diagnosis not present

## 2018-04-16 DIAGNOSIS — N186 End stage renal disease: Secondary | ICD-10-CM | POA: Diagnosis not present

## 2018-04-16 DIAGNOSIS — N139 Obstructive and reflux uropathy, unspecified: Secondary | ICD-10-CM | POA: Diagnosis not present

## 2018-04-16 DIAGNOSIS — N2581 Secondary hyperparathyroidism of renal origin: Secondary | ICD-10-CM | POA: Diagnosis not present

## 2018-04-16 DIAGNOSIS — Z992 Dependence on renal dialysis: Secondary | ICD-10-CM | POA: Diagnosis not present

## 2018-04-18 DIAGNOSIS — M79661 Pain in right lower leg: Secondary | ICD-10-CM | POA: Diagnosis not present

## 2018-04-18 DIAGNOSIS — M25562 Pain in left knee: Secondary | ICD-10-CM | POA: Diagnosis not present

## 2018-04-18 DIAGNOSIS — M5417 Radiculopathy, lumbosacral region: Secondary | ICD-10-CM | POA: Diagnosis not present

## 2018-04-18 DIAGNOSIS — M25561 Pain in right knee: Secondary | ICD-10-CM | POA: Diagnosis not present

## 2018-04-19 DIAGNOSIS — N186 End stage renal disease: Secondary | ICD-10-CM | POA: Diagnosis not present

## 2018-04-19 DIAGNOSIS — N2581 Secondary hyperparathyroidism of renal origin: Secondary | ICD-10-CM | POA: Diagnosis not present

## 2018-04-21 DIAGNOSIS — N186 End stage renal disease: Secondary | ICD-10-CM | POA: Diagnosis not present

## 2018-04-21 DIAGNOSIS — N2581 Secondary hyperparathyroidism of renal origin: Secondary | ICD-10-CM | POA: Diagnosis not present

## 2018-04-23 DIAGNOSIS — N186 End stage renal disease: Secondary | ICD-10-CM | POA: Diagnosis not present

## 2018-04-23 DIAGNOSIS — N2581 Secondary hyperparathyroidism of renal origin: Secondary | ICD-10-CM | POA: Diagnosis not present

## 2018-04-26 DIAGNOSIS — N2581 Secondary hyperparathyroidism of renal origin: Secondary | ICD-10-CM | POA: Diagnosis not present

## 2018-04-26 DIAGNOSIS — N186 End stage renal disease: Secondary | ICD-10-CM | POA: Diagnosis not present

## 2018-04-27 DIAGNOSIS — M25562 Pain in left knee: Secondary | ICD-10-CM | POA: Diagnosis not present

## 2018-04-27 DIAGNOSIS — M5417 Radiculopathy, lumbosacral region: Secondary | ICD-10-CM | POA: Diagnosis not present

## 2018-04-27 DIAGNOSIS — M25561 Pain in right knee: Secondary | ICD-10-CM | POA: Diagnosis not present

## 2018-04-27 DIAGNOSIS — M79661 Pain in right lower leg: Secondary | ICD-10-CM | POA: Diagnosis not present

## 2018-04-28 DIAGNOSIS — N2581 Secondary hyperparathyroidism of renal origin: Secondary | ICD-10-CM | POA: Diagnosis not present

## 2018-04-28 DIAGNOSIS — N186 End stage renal disease: Secondary | ICD-10-CM | POA: Diagnosis not present

## 2018-04-30 DIAGNOSIS — N186 End stage renal disease: Secondary | ICD-10-CM | POA: Diagnosis not present

## 2018-04-30 DIAGNOSIS — N2581 Secondary hyperparathyroidism of renal origin: Secondary | ICD-10-CM | POA: Diagnosis not present

## 2018-05-03 DIAGNOSIS — N2581 Secondary hyperparathyroidism of renal origin: Secondary | ICD-10-CM | POA: Diagnosis not present

## 2018-05-03 DIAGNOSIS — N186 End stage renal disease: Secondary | ICD-10-CM | POA: Diagnosis not present

## 2018-05-05 DIAGNOSIS — N186 End stage renal disease: Secondary | ICD-10-CM | POA: Diagnosis not present

## 2018-05-05 DIAGNOSIS — N2581 Secondary hyperparathyroidism of renal origin: Secondary | ICD-10-CM | POA: Diagnosis not present

## 2018-05-07 DIAGNOSIS — N2581 Secondary hyperparathyroidism of renal origin: Secondary | ICD-10-CM | POA: Diagnosis not present

## 2018-05-07 DIAGNOSIS — N186 End stage renal disease: Secondary | ICD-10-CM | POA: Diagnosis not present

## 2018-05-10 DIAGNOSIS — N2581 Secondary hyperparathyroidism of renal origin: Secondary | ICD-10-CM | POA: Diagnosis not present

## 2018-05-10 DIAGNOSIS — N186 End stage renal disease: Secondary | ICD-10-CM | POA: Diagnosis not present

## 2018-05-12 DIAGNOSIS — N186 End stage renal disease: Secondary | ICD-10-CM | POA: Diagnosis not present

## 2018-05-12 DIAGNOSIS — N2581 Secondary hyperparathyroidism of renal origin: Secondary | ICD-10-CM | POA: Diagnosis not present

## 2018-05-14 DIAGNOSIS — N2581 Secondary hyperparathyroidism of renal origin: Secondary | ICD-10-CM | POA: Diagnosis not present

## 2018-05-14 DIAGNOSIS — N186 End stage renal disease: Secondary | ICD-10-CM | POA: Diagnosis not present

## 2018-05-15 DIAGNOSIS — Z992 Dependence on renal dialysis: Secondary | ICD-10-CM | POA: Diagnosis not present

## 2018-05-15 DIAGNOSIS — N139 Obstructive and reflux uropathy, unspecified: Secondary | ICD-10-CM | POA: Diagnosis not present

## 2018-05-15 DIAGNOSIS — N186 End stage renal disease: Secondary | ICD-10-CM | POA: Diagnosis not present

## 2018-05-17 DIAGNOSIS — N2581 Secondary hyperparathyroidism of renal origin: Secondary | ICD-10-CM | POA: Diagnosis not present

## 2018-05-17 DIAGNOSIS — N186 End stage renal disease: Secondary | ICD-10-CM | POA: Diagnosis not present

## 2018-05-19 DIAGNOSIS — N186 End stage renal disease: Secondary | ICD-10-CM | POA: Diagnosis not present

## 2018-05-19 DIAGNOSIS — N2581 Secondary hyperparathyroidism of renal origin: Secondary | ICD-10-CM | POA: Diagnosis not present

## 2018-05-21 DIAGNOSIS — N186 End stage renal disease: Secondary | ICD-10-CM | POA: Diagnosis not present

## 2018-05-21 DIAGNOSIS — N2581 Secondary hyperparathyroidism of renal origin: Secondary | ICD-10-CM | POA: Diagnosis not present

## 2018-05-24 DIAGNOSIS — N2581 Secondary hyperparathyroidism of renal origin: Secondary | ICD-10-CM | POA: Diagnosis not present

## 2018-05-24 DIAGNOSIS — N186 End stage renal disease: Secondary | ICD-10-CM | POA: Diagnosis not present

## 2018-05-26 DIAGNOSIS — N2581 Secondary hyperparathyroidism of renal origin: Secondary | ICD-10-CM | POA: Diagnosis not present

## 2018-05-26 DIAGNOSIS — N186 End stage renal disease: Secondary | ICD-10-CM | POA: Diagnosis not present

## 2018-05-28 DIAGNOSIS — N186 End stage renal disease: Secondary | ICD-10-CM | POA: Diagnosis not present

## 2018-05-28 DIAGNOSIS — N2581 Secondary hyperparathyroidism of renal origin: Secondary | ICD-10-CM | POA: Diagnosis not present

## 2018-05-31 DIAGNOSIS — N186 End stage renal disease: Secondary | ICD-10-CM | POA: Diagnosis not present

## 2018-05-31 DIAGNOSIS — N2581 Secondary hyperparathyroidism of renal origin: Secondary | ICD-10-CM | POA: Diagnosis not present

## 2018-06-02 DIAGNOSIS — N2581 Secondary hyperparathyroidism of renal origin: Secondary | ICD-10-CM | POA: Diagnosis not present

## 2018-06-02 DIAGNOSIS — N186 End stage renal disease: Secondary | ICD-10-CM | POA: Diagnosis not present

## 2018-06-04 DIAGNOSIS — N2581 Secondary hyperparathyroidism of renal origin: Secondary | ICD-10-CM | POA: Diagnosis not present

## 2018-06-04 DIAGNOSIS — N186 End stage renal disease: Secondary | ICD-10-CM | POA: Diagnosis not present

## 2018-06-07 DIAGNOSIS — N186 End stage renal disease: Secondary | ICD-10-CM | POA: Diagnosis not present

## 2018-06-07 DIAGNOSIS — N2581 Secondary hyperparathyroidism of renal origin: Secondary | ICD-10-CM | POA: Diagnosis not present

## 2018-06-09 DIAGNOSIS — N2581 Secondary hyperparathyroidism of renal origin: Secondary | ICD-10-CM | POA: Diagnosis not present

## 2018-06-09 DIAGNOSIS — N186 End stage renal disease: Secondary | ICD-10-CM | POA: Diagnosis not present

## 2018-06-11 DIAGNOSIS — N186 End stage renal disease: Secondary | ICD-10-CM | POA: Diagnosis not present

## 2018-06-11 DIAGNOSIS — N2581 Secondary hyperparathyroidism of renal origin: Secondary | ICD-10-CM | POA: Diagnosis not present

## 2018-06-14 DIAGNOSIS — N2581 Secondary hyperparathyroidism of renal origin: Secondary | ICD-10-CM | POA: Diagnosis not present

## 2018-06-14 DIAGNOSIS — N186 End stage renal disease: Secondary | ICD-10-CM | POA: Diagnosis not present

## 2018-06-15 DIAGNOSIS — N186 End stage renal disease: Secondary | ICD-10-CM | POA: Diagnosis not present

## 2018-06-15 DIAGNOSIS — N139 Obstructive and reflux uropathy, unspecified: Secondary | ICD-10-CM | POA: Diagnosis not present

## 2018-06-15 DIAGNOSIS — Z992 Dependence on renal dialysis: Secondary | ICD-10-CM | POA: Diagnosis not present

## 2018-06-16 DIAGNOSIS — N186 End stage renal disease: Secondary | ICD-10-CM | POA: Diagnosis not present

## 2018-06-16 DIAGNOSIS — N2581 Secondary hyperparathyroidism of renal origin: Secondary | ICD-10-CM | POA: Diagnosis not present

## 2018-06-18 DIAGNOSIS — N186 End stage renal disease: Secondary | ICD-10-CM | POA: Diagnosis not present

## 2018-06-18 DIAGNOSIS — N2581 Secondary hyperparathyroidism of renal origin: Secondary | ICD-10-CM | POA: Diagnosis not present

## 2018-06-21 DIAGNOSIS — N186 End stage renal disease: Secondary | ICD-10-CM | POA: Diagnosis not present

## 2018-06-21 DIAGNOSIS — N2581 Secondary hyperparathyroidism of renal origin: Secondary | ICD-10-CM | POA: Diagnosis not present

## 2018-06-23 DIAGNOSIS — N186 End stage renal disease: Secondary | ICD-10-CM | POA: Diagnosis not present

## 2018-06-23 DIAGNOSIS — N2581 Secondary hyperparathyroidism of renal origin: Secondary | ICD-10-CM | POA: Diagnosis not present

## 2018-06-25 DIAGNOSIS — N186 End stage renal disease: Secondary | ICD-10-CM | POA: Diagnosis not present

## 2018-06-25 DIAGNOSIS — N2581 Secondary hyperparathyroidism of renal origin: Secondary | ICD-10-CM | POA: Diagnosis not present

## 2018-06-28 DIAGNOSIS — N186 End stage renal disease: Secondary | ICD-10-CM | POA: Diagnosis not present

## 2018-06-28 DIAGNOSIS — N2581 Secondary hyperparathyroidism of renal origin: Secondary | ICD-10-CM | POA: Diagnosis not present

## 2018-06-30 DIAGNOSIS — N2581 Secondary hyperparathyroidism of renal origin: Secondary | ICD-10-CM | POA: Diagnosis not present

## 2018-06-30 DIAGNOSIS — N186 End stage renal disease: Secondary | ICD-10-CM | POA: Diagnosis not present

## 2018-07-02 DIAGNOSIS — N2581 Secondary hyperparathyroidism of renal origin: Secondary | ICD-10-CM | POA: Diagnosis not present

## 2018-07-02 DIAGNOSIS — N186 End stage renal disease: Secondary | ICD-10-CM | POA: Diagnosis not present

## 2018-07-07 DIAGNOSIS — N186 End stage renal disease: Secondary | ICD-10-CM | POA: Diagnosis not present

## 2018-07-07 DIAGNOSIS — N2581 Secondary hyperparathyroidism of renal origin: Secondary | ICD-10-CM | POA: Diagnosis not present

## 2018-07-09 DIAGNOSIS — N2581 Secondary hyperparathyroidism of renal origin: Secondary | ICD-10-CM | POA: Diagnosis not present

## 2018-07-09 DIAGNOSIS — N186 End stage renal disease: Secondary | ICD-10-CM | POA: Diagnosis not present

## 2018-07-12 DIAGNOSIS — N186 End stage renal disease: Secondary | ICD-10-CM | POA: Diagnosis not present

## 2018-07-12 DIAGNOSIS — N2581 Secondary hyperparathyroidism of renal origin: Secondary | ICD-10-CM | POA: Diagnosis not present

## 2018-07-14 DIAGNOSIS — N186 End stage renal disease: Secondary | ICD-10-CM | POA: Diagnosis not present

## 2018-07-14 DIAGNOSIS — N2581 Secondary hyperparathyroidism of renal origin: Secondary | ICD-10-CM | POA: Diagnosis not present

## 2018-07-15 DIAGNOSIS — N186 End stage renal disease: Secondary | ICD-10-CM | POA: Diagnosis not present

## 2018-07-15 DIAGNOSIS — Z992 Dependence on renal dialysis: Secondary | ICD-10-CM | POA: Diagnosis not present

## 2018-07-15 DIAGNOSIS — N139 Obstructive and reflux uropathy, unspecified: Secondary | ICD-10-CM | POA: Diagnosis not present

## 2018-07-16 DIAGNOSIS — N186 End stage renal disease: Secondary | ICD-10-CM | POA: Diagnosis not present

## 2018-07-16 DIAGNOSIS — N2581 Secondary hyperparathyroidism of renal origin: Secondary | ICD-10-CM | POA: Diagnosis not present

## 2018-07-19 DIAGNOSIS — N2581 Secondary hyperparathyroidism of renal origin: Secondary | ICD-10-CM | POA: Diagnosis not present

## 2018-07-19 DIAGNOSIS — N186 End stage renal disease: Secondary | ICD-10-CM | POA: Diagnosis not present

## 2018-07-21 DIAGNOSIS — N186 End stage renal disease: Secondary | ICD-10-CM | POA: Diagnosis not present

## 2018-07-21 DIAGNOSIS — N2581 Secondary hyperparathyroidism of renal origin: Secondary | ICD-10-CM | POA: Diagnosis not present

## 2018-07-23 DIAGNOSIS — N186 End stage renal disease: Secondary | ICD-10-CM | POA: Diagnosis not present

## 2018-07-23 DIAGNOSIS — N2581 Secondary hyperparathyroidism of renal origin: Secondary | ICD-10-CM | POA: Diagnosis not present

## 2018-07-26 DIAGNOSIS — N186 End stage renal disease: Secondary | ICD-10-CM | POA: Diagnosis not present

## 2018-07-26 DIAGNOSIS — N2581 Secondary hyperparathyroidism of renal origin: Secondary | ICD-10-CM | POA: Diagnosis not present

## 2018-07-28 DIAGNOSIS — N2581 Secondary hyperparathyroidism of renal origin: Secondary | ICD-10-CM | POA: Diagnosis not present

## 2018-07-28 DIAGNOSIS — N186 End stage renal disease: Secondary | ICD-10-CM | POA: Diagnosis not present

## 2018-07-30 DIAGNOSIS — N2581 Secondary hyperparathyroidism of renal origin: Secondary | ICD-10-CM | POA: Diagnosis not present

## 2018-07-30 DIAGNOSIS — N186 End stage renal disease: Secondary | ICD-10-CM | POA: Diagnosis not present

## 2018-08-02 DIAGNOSIS — N2581 Secondary hyperparathyroidism of renal origin: Secondary | ICD-10-CM | POA: Diagnosis not present

## 2018-08-02 DIAGNOSIS — N186 End stage renal disease: Secondary | ICD-10-CM | POA: Diagnosis not present

## 2018-08-04 DIAGNOSIS — E039 Hypothyroidism, unspecified: Secondary | ICD-10-CM | POA: Diagnosis not present

## 2018-08-04 DIAGNOSIS — N2581 Secondary hyperparathyroidism of renal origin: Secondary | ICD-10-CM | POA: Diagnosis not present

## 2018-08-04 DIAGNOSIS — N186 End stage renal disease: Secondary | ICD-10-CM | POA: Diagnosis not present

## 2018-08-06 DIAGNOSIS — N2581 Secondary hyperparathyroidism of renal origin: Secondary | ICD-10-CM | POA: Diagnosis not present

## 2018-08-06 DIAGNOSIS — N186 End stage renal disease: Secondary | ICD-10-CM | POA: Diagnosis not present

## 2018-08-09 DIAGNOSIS — N2581 Secondary hyperparathyroidism of renal origin: Secondary | ICD-10-CM | POA: Diagnosis not present

## 2018-08-09 DIAGNOSIS — N186 End stage renal disease: Secondary | ICD-10-CM | POA: Diagnosis not present

## 2018-08-10 DIAGNOSIS — F324 Major depressive disorder, single episode, in partial remission: Secondary | ICD-10-CM | POA: Diagnosis not present

## 2018-08-10 DIAGNOSIS — N186 End stage renal disease: Secondary | ICD-10-CM | POA: Diagnosis not present

## 2018-08-10 DIAGNOSIS — F5101 Primary insomnia: Secondary | ICD-10-CM | POA: Diagnosis not present

## 2018-08-10 DIAGNOSIS — Z992 Dependence on renal dialysis: Secondary | ICD-10-CM | POA: Diagnosis not present

## 2018-08-10 DIAGNOSIS — E039 Hypothyroidism, unspecified: Secondary | ICD-10-CM | POA: Diagnosis not present

## 2018-08-10 DIAGNOSIS — F411 Generalized anxiety disorder: Secondary | ICD-10-CM | POA: Diagnosis not present

## 2018-08-11 DIAGNOSIS — N2581 Secondary hyperparathyroidism of renal origin: Secondary | ICD-10-CM | POA: Diagnosis not present

## 2018-08-11 DIAGNOSIS — N186 End stage renal disease: Secondary | ICD-10-CM | POA: Diagnosis not present

## 2018-08-13 DIAGNOSIS — N186 End stage renal disease: Secondary | ICD-10-CM | POA: Diagnosis not present

## 2018-08-13 DIAGNOSIS — N2581 Secondary hyperparathyroidism of renal origin: Secondary | ICD-10-CM | POA: Diagnosis not present

## 2018-08-15 DIAGNOSIS — Z992 Dependence on renal dialysis: Secondary | ICD-10-CM | POA: Diagnosis not present

## 2018-08-15 DIAGNOSIS — N186 End stage renal disease: Secondary | ICD-10-CM | POA: Diagnosis not present

## 2018-08-15 DIAGNOSIS — N139 Obstructive and reflux uropathy, unspecified: Secondary | ICD-10-CM | POA: Diagnosis not present

## 2018-08-16 DIAGNOSIS — N2581 Secondary hyperparathyroidism of renal origin: Secondary | ICD-10-CM | POA: Diagnosis not present

## 2018-08-16 DIAGNOSIS — N186 End stage renal disease: Secondary | ICD-10-CM | POA: Diagnosis not present

## 2018-08-18 DIAGNOSIS — N2581 Secondary hyperparathyroidism of renal origin: Secondary | ICD-10-CM | POA: Diagnosis not present

## 2018-08-18 DIAGNOSIS — N186 End stage renal disease: Secondary | ICD-10-CM | POA: Diagnosis not present

## 2018-08-20 DIAGNOSIS — N2581 Secondary hyperparathyroidism of renal origin: Secondary | ICD-10-CM | POA: Diagnosis not present

## 2018-08-20 DIAGNOSIS — N186 End stage renal disease: Secondary | ICD-10-CM | POA: Diagnosis not present

## 2018-08-23 DIAGNOSIS — N2581 Secondary hyperparathyroidism of renal origin: Secondary | ICD-10-CM | POA: Diagnosis not present

## 2018-08-23 DIAGNOSIS — N186 End stage renal disease: Secondary | ICD-10-CM | POA: Diagnosis not present

## 2018-08-25 DIAGNOSIS — N2581 Secondary hyperparathyroidism of renal origin: Secondary | ICD-10-CM | POA: Diagnosis not present

## 2018-08-25 DIAGNOSIS — N186 End stage renal disease: Secondary | ICD-10-CM | POA: Diagnosis not present

## 2018-08-27 DIAGNOSIS — N2581 Secondary hyperparathyroidism of renal origin: Secondary | ICD-10-CM | POA: Diagnosis not present

## 2018-08-27 DIAGNOSIS — N186 End stage renal disease: Secondary | ICD-10-CM | POA: Diagnosis not present

## 2018-08-30 DIAGNOSIS — N2581 Secondary hyperparathyroidism of renal origin: Secondary | ICD-10-CM | POA: Diagnosis not present

## 2018-08-30 DIAGNOSIS — N186 End stage renal disease: Secondary | ICD-10-CM | POA: Diagnosis not present

## 2018-09-01 DIAGNOSIS — N186 End stage renal disease: Secondary | ICD-10-CM | POA: Diagnosis not present

## 2018-09-01 DIAGNOSIS — N2581 Secondary hyperparathyroidism of renal origin: Secondary | ICD-10-CM | POA: Diagnosis not present

## 2018-09-03 DIAGNOSIS — N2581 Secondary hyperparathyroidism of renal origin: Secondary | ICD-10-CM | POA: Diagnosis not present

## 2018-09-03 DIAGNOSIS — N186 End stage renal disease: Secondary | ICD-10-CM | POA: Diagnosis not present

## 2018-09-06 DIAGNOSIS — N186 End stage renal disease: Secondary | ICD-10-CM | POA: Diagnosis not present

## 2018-09-06 DIAGNOSIS — N2581 Secondary hyperparathyroidism of renal origin: Secondary | ICD-10-CM | POA: Diagnosis not present

## 2018-09-08 DIAGNOSIS — N2581 Secondary hyperparathyroidism of renal origin: Secondary | ICD-10-CM | POA: Diagnosis not present

## 2018-09-08 DIAGNOSIS — N186 End stage renal disease: Secondary | ICD-10-CM | POA: Diagnosis not present

## 2018-09-10 DIAGNOSIS — N186 End stage renal disease: Secondary | ICD-10-CM | POA: Diagnosis not present

## 2018-09-10 DIAGNOSIS — N2581 Secondary hyperparathyroidism of renal origin: Secondary | ICD-10-CM | POA: Diagnosis not present

## 2018-09-13 DIAGNOSIS — N186 End stage renal disease: Secondary | ICD-10-CM | POA: Diagnosis not present

## 2018-09-13 DIAGNOSIS — N2581 Secondary hyperparathyroidism of renal origin: Secondary | ICD-10-CM | POA: Diagnosis not present

## 2018-09-14 DIAGNOSIS — N139 Obstructive and reflux uropathy, unspecified: Secondary | ICD-10-CM | POA: Diagnosis not present

## 2018-09-14 DIAGNOSIS — N186 End stage renal disease: Secondary | ICD-10-CM | POA: Diagnosis not present

## 2018-09-14 DIAGNOSIS — Z992 Dependence on renal dialysis: Secondary | ICD-10-CM | POA: Diagnosis not present

## 2018-09-15 DIAGNOSIS — N2581 Secondary hyperparathyroidism of renal origin: Secondary | ICD-10-CM | POA: Diagnosis not present

## 2018-09-15 DIAGNOSIS — N186 End stage renal disease: Secondary | ICD-10-CM | POA: Diagnosis not present

## 2018-09-17 DIAGNOSIS — N186 End stage renal disease: Secondary | ICD-10-CM | POA: Diagnosis not present

## 2018-09-17 DIAGNOSIS — N2581 Secondary hyperparathyroidism of renal origin: Secondary | ICD-10-CM | POA: Diagnosis not present

## 2018-09-20 DIAGNOSIS — N2581 Secondary hyperparathyroidism of renal origin: Secondary | ICD-10-CM | POA: Diagnosis not present

## 2018-09-20 DIAGNOSIS — N186 End stage renal disease: Secondary | ICD-10-CM | POA: Diagnosis not present

## 2018-09-21 DIAGNOSIS — E039 Hypothyroidism, unspecified: Secondary | ICD-10-CM | POA: Diagnosis not present

## 2018-09-21 DIAGNOSIS — F324 Major depressive disorder, single episode, in partial remission: Secondary | ICD-10-CM | POA: Diagnosis not present

## 2018-09-21 DIAGNOSIS — Z992 Dependence on renal dialysis: Secondary | ICD-10-CM | POA: Diagnosis not present

## 2018-09-21 DIAGNOSIS — F5101 Primary insomnia: Secondary | ICD-10-CM | POA: Diagnosis not present

## 2018-09-21 DIAGNOSIS — F411 Generalized anxiety disorder: Secondary | ICD-10-CM | POA: Diagnosis not present

## 2018-09-21 DIAGNOSIS — N186 End stage renal disease: Secondary | ICD-10-CM | POA: Diagnosis not present

## 2018-09-21 DIAGNOSIS — M79671 Pain in right foot: Secondary | ICD-10-CM | POA: Diagnosis not present

## 2018-09-22 DIAGNOSIS — N186 End stage renal disease: Secondary | ICD-10-CM | POA: Diagnosis not present

## 2018-09-22 DIAGNOSIS — N2581 Secondary hyperparathyroidism of renal origin: Secondary | ICD-10-CM | POA: Diagnosis not present

## 2018-09-24 DIAGNOSIS — N186 End stage renal disease: Secondary | ICD-10-CM | POA: Diagnosis not present

## 2018-09-24 DIAGNOSIS — N2581 Secondary hyperparathyroidism of renal origin: Secondary | ICD-10-CM | POA: Diagnosis not present

## 2018-09-28 ENCOUNTER — Other Ambulatory Visit: Payer: Self-pay

## 2018-09-28 ENCOUNTER — Inpatient Hospital Stay (HOSPITAL_COMMUNITY)
Admission: EM | Admit: 2018-09-28 | Discharge: 2018-10-01 | DRG: 640 | Disposition: A | Discharge status: Home Health | Payer: Medicare Other | Attending: Internal Medicine | Admitting: Internal Medicine

## 2018-09-28 DIAGNOSIS — Z8349 Family history of other endocrine, nutritional and metabolic diseases: Secondary | ICD-10-CM | POA: Diagnosis not present

## 2018-09-28 DIAGNOSIS — L03113 Cellulitis of right upper limb: Secondary | ICD-10-CM | POA: Diagnosis not present

## 2018-09-28 DIAGNOSIS — Z1159 Encounter for screening for other viral diseases: Secondary | ICD-10-CM

## 2018-09-28 DIAGNOSIS — Z801 Family history of malignant neoplasm of trachea, bronchus and lung: Secondary | ICD-10-CM | POA: Diagnosis not present

## 2018-09-28 DIAGNOSIS — L538 Other specified erythematous conditions: Secondary | ICD-10-CM | POA: Diagnosis not present

## 2018-09-28 DIAGNOSIS — R52 Pain, unspecified: Secondary | ICD-10-CM | POA: Diagnosis not present

## 2018-09-28 DIAGNOSIS — E875 Hyperkalemia: Principal | ICD-10-CM | POA: Diagnosis present

## 2018-09-28 DIAGNOSIS — Z833 Family history of diabetes mellitus: Secondary | ICD-10-CM | POA: Diagnosis not present

## 2018-09-28 DIAGNOSIS — I739 Peripheral vascular disease, unspecified: Secondary | ICD-10-CM | POA: Diagnosis present

## 2018-09-28 DIAGNOSIS — E162 Hypoglycemia, unspecified: Secondary | ICD-10-CM | POA: Diagnosis present

## 2018-09-28 DIAGNOSIS — E8729 Other acidosis: Secondary | ICD-10-CM

## 2018-09-28 DIAGNOSIS — Z8249 Family history of ischemic heart disease and other diseases of the circulatory system: Secondary | ICD-10-CM

## 2018-09-28 DIAGNOSIS — Z888 Allergy status to other drugs, medicaments and biological substances status: Secondary | ICD-10-CM

## 2018-09-28 DIAGNOSIS — R069 Unspecified abnormalities of breathing: Secondary | ICD-10-CM

## 2018-09-28 DIAGNOSIS — D631 Anemia in chronic kidney disease: Secondary | ICD-10-CM | POA: Diagnosis present

## 2018-09-28 DIAGNOSIS — I129 Hypertensive chronic kidney disease with stage 1 through stage 4 chronic kidney disease, or unspecified chronic kidney disease: Secondary | ICD-10-CM | POA: Diagnosis not present

## 2018-09-28 DIAGNOSIS — N2581 Secondary hyperparathyroidism of renal origin: Secondary | ICD-10-CM | POA: Diagnosis present

## 2018-09-28 DIAGNOSIS — Z8541 Personal history of malignant neoplasm of cervix uteri: Secondary | ICD-10-CM | POA: Diagnosis not present

## 2018-09-28 DIAGNOSIS — I120 Hypertensive chronic kidney disease with stage 5 chronic kidney disease or end stage renal disease: Secondary | ICD-10-CM | POA: Diagnosis present

## 2018-09-28 DIAGNOSIS — I82890 Acute embolism and thrombosis of other specified veins: Secondary | ICD-10-CM | POA: Diagnosis not present

## 2018-09-28 DIAGNOSIS — Z923 Personal history of irradiation: Secondary | ICD-10-CM | POA: Diagnosis not present

## 2018-09-28 DIAGNOSIS — I10 Essential (primary) hypertension: Secondary | ICD-10-CM | POA: Diagnosis not present

## 2018-09-28 DIAGNOSIS — M7989 Other specified soft tissue disorders: Secondary | ICD-10-CM | POA: Diagnosis not present

## 2018-09-28 DIAGNOSIS — R001 Bradycardia, unspecified: Secondary | ICD-10-CM | POA: Diagnosis present

## 2018-09-28 DIAGNOSIS — N186 End stage renal disease: Secondary | ICD-10-CM | POA: Diagnosis present

## 2018-09-28 DIAGNOSIS — M6282 Rhabdomyolysis: Secondary | ICD-10-CM | POA: Diagnosis present

## 2018-09-28 DIAGNOSIS — R531 Weakness: Secondary | ICD-10-CM | POA: Diagnosis not present

## 2018-09-28 DIAGNOSIS — I82611 Acute embolism and thrombosis of superficial veins of right upper extremity: Secondary | ICD-10-CM | POA: Diagnosis not present

## 2018-09-28 DIAGNOSIS — Z992 Dependence on renal dialysis: Secondary | ICD-10-CM | POA: Diagnosis not present

## 2018-09-28 DIAGNOSIS — E872 Acidosis: Secondary | ICD-10-CM | POA: Diagnosis not present

## 2018-09-28 LAB — BASIC METABOLIC PANEL
Anion gap: 20 — ABNORMAL HIGH (ref 5–15)
Anion gap: 23 — ABNORMAL HIGH (ref 5–15)
BUN: 134 mg/dL — ABNORMAL HIGH (ref 8–23)
BUN: 134 mg/dL — ABNORMAL HIGH (ref 8–23)
CO2: 24 mmol/L (ref 22–32)
CO2: 19 mmol/L — ABNORMAL LOW (ref 22–32)
Calcium: 10.8 mg/dL — ABNORMAL HIGH (ref 8.9–10.3)
Calcium: 10.5 mg/dL — ABNORMAL HIGH (ref 8.9–10.3)
Chloride: 90 mmol/L — ABNORMAL LOW (ref 98–111)
Chloride: 91 mmol/L — ABNORMAL LOW (ref 98–111)
Creatinine, Ser: 14.07 mg/dL — ABNORMAL HIGH (ref 0.44–1.00)
Creatinine, Ser: 13.61 mg/dL — ABNORMAL HIGH (ref 0.44–1.00)
GFR calc Af Amer: 3 mL/min — ABNORMAL LOW (ref >60)
GFR calc Af Amer: 3 mL/min — ABNORMAL LOW (ref >60)
GFR calc non Af Amer: 2 mL/min — ABNORMAL LOW (ref >60)
GFR calc non Af Amer: 3 mL/min — ABNORMAL LOW (ref >60)
Glucose, Bld: 93 mg/dL (ref 70–99)
Glucose, Bld: 116 mg/dL — ABNORMAL HIGH (ref 70–99)
Potassium: >7.5 mmol/L (ref 3.5–5.1)
Potassium: >7.5 mmol/L (ref 3.5–5.1)
Sodium: 134 mmol/L — ABNORMAL LOW (ref 135–145)
Sodium: 133 mmol/L — ABNORMAL LOW (ref 135–145)

## 2018-09-28 LAB — HEPATIC FUNCTION PANEL
ALT: 21 U/L (ref 0–44)
AST: 16 U/L (ref 15–41)
Albumin: 3.9 g/dL (ref 3.5–5.0)
Alkaline Phosphatase: 81 U/L (ref 38–126)
Bilirubin, Direct: <0.1 mg/dL (ref 0.0–0.2)
Total Bilirubin: 0.5 mg/dL (ref 0.3–1.2)
Total Protein: 6.8 g/dL (ref 6.5–8.1)

## 2018-09-28 LAB — CBC WITH DIFFERENTIAL/PLATELET
Abs Immature Granulocytes: 0.06 10*3/uL (ref 0.00–0.07)
Basophils Absolute: 0.1 10*3/uL (ref 0.0–0.1)
Basophils Relative: 0 %
Eosinophils Absolute: 0.1 10*3/uL (ref 0.0–0.5)
Eosinophils Relative: 0 %
HCT: 47 % — ABNORMAL HIGH (ref 36.0–46.0)
Hemoglobin: 15.6 g/dL — ABNORMAL HIGH (ref 12.0–15.0)
Immature Granulocytes: 0 %
Lymphocytes Relative: 7 %
Lymphs Abs: 1.1 10*3/uL (ref 0.7–4.0)
MCH: 35.5 pg — ABNORMAL HIGH (ref 26.0–34.0)
MCHC: 33.2 g/dL (ref 30.0–36.0)
MCV: 106.8 fL — ABNORMAL HIGH (ref 80.0–100.0)
Monocytes Absolute: 1.2 10*3/uL — ABNORMAL HIGH (ref 0.1–1.0)
Monocytes Relative: 8 %
Neutro Abs: 12.4 10*3/uL — ABNORMAL HIGH (ref 1.7–7.7)
Neutrophils Relative %: 85 %
Platelets: 308 10*3/uL (ref 150–400)
RBC: 4.4 MIL/uL (ref 3.87–5.11)
RDW: 14.6 % (ref 11.5–15.5)
WBC: 14.8 10*3/uL — ABNORMAL HIGH (ref 4.0–10.5)
nRBC: 0 % (ref 0.0–0.2)

## 2018-09-28 LAB — SARS CORONAVIRUS 2 BY RT PCR (HOSPITAL ORDER, PERFORMED IN ~~LOC~~ HOSPITAL LAB): SARS Coronavirus 2: NEGATIVE

## 2018-09-28 LAB — TROPONIN I (HIGH SENSITIVITY)
Troponin I (High Sensitivity): 41 ng/L — ABNORMAL HIGH (ref <18)
Troponin I (High Sensitivity): 40 ng/L — ABNORMAL HIGH (ref <18)

## 2018-09-28 LAB — CK: Total CK: 113 U/L (ref 38–234)

## 2018-09-28 LAB — CBG MONITORING, ED: Glucose-Capillary: 49 mg/dL — ABNORMAL LOW (ref 70–99)

## 2018-09-28 LAB — MAGNESIUM: Magnesium: 3.9 mg/dL — ABNORMAL HIGH (ref 1.7–2.4)

## 2018-09-28 LAB — PHOSPHORUS: Phosphorus: 7.2 mg/dL — ABNORMAL HIGH (ref 2.5–4.6)

## 2018-09-28 MED ORDER — CALCIUM GLUCONATE-NACL 1-0.675 GM/50ML-% IV SOLN
1.0000 g | Freq: Once | INTRAVENOUS | Status: DC
  Administered 2018-09-28: 1000 mg via INTRAVENOUS
  Filled 2018-09-28: qty 50

## 2018-09-28 MED ORDER — DEXTROSE 50 % IV SOLN
INTRAVENOUS | Status: AC
  Administered 2018-09-28: 50 mL via INTRAVENOUS
  Filled 2018-09-28: qty 50

## 2018-09-28 MED ORDER — CHLORHEXIDINE GLUCONATE CLOTH 2 % EX PADS: 6.0000 | MEDICATED_PAD | Freq: Every day | CUTANEOUS | Status: DC

## 2018-09-28 MED ORDER — CALCIUM GLUCONATE-NACL 1-0.675 GM/50ML-% IV SOLN
1.0000 g | Freq: Once | INTRAVENOUS | Status: AC
  Administered 2018-09-28: 1000 mg via INTRAVENOUS
  Filled 2018-09-28: qty 50

## 2018-09-28 MED ORDER — SODIUM CHLORIDE 0.9 % IV SOLN: 1.0000 g | Freq: Once | INTRAVENOUS | Status: DC

## 2018-09-28 MED ORDER — DEXTROSE 50 % IV SOLN
1.0000 | Freq: Once | INTRAVENOUS | Status: AC
  Administered 2018-09-28: 21:00:00 50 mL via INTRAVENOUS

## 2018-09-28 MED ORDER — DEXTROSE 50 % IV SOLN
50.0000 mL | Freq: Once | INTRAVENOUS | Status: AC
  Administered 2018-09-28: 50 mL via INTRAVENOUS
  Filled 2018-09-28: qty 50

## 2018-09-28 MED ORDER — INSULIN ASPART 100 UNIT/ML ~~LOC~~ SOLN
10.0000 [IU] | Freq: Once | SUBCUTANEOUS | Status: AC
  Administered 2018-09-28: 10 [IU] via INTRAVENOUS

## 2018-09-28 MED ORDER — SODIUM BICARBONATE 8.4 % IV SOLN
50.0000 meq | Freq: Once | INTRAVENOUS | Status: AC
  Administered 2018-09-28: 50 meq via INTRAVENOUS
  Filled 2018-09-28: qty 50

## 2018-09-28 MED ORDER — HEPARIN SODIUM (PORCINE) 5000 UNIT/ML IJ SOLN
5000.0000 [IU] | Freq: Three times a day (TID) | INTRAMUSCULAR | Status: DC
  Administered 2018-09-29 – 2018-09-30 (×5): 5000 [IU] via SUBCUTANEOUS
  Filled 2018-09-28 (×5): qty 1

## 2018-09-28 MED ORDER — SODIUM BICARBONATE 8.4 % IV SOLN: 50.0000 meq | Freq: Once | INTRAVENOUS | Status: DC

## 2018-09-28 MED ORDER — SODIUM ZIRCONIUM CYCLOSILICATE 10 G PO PACK
10.0000 g | PACK | Freq: Every day | ORAL | Status: DC
  Administered 2018-09-28: 10 g via ORAL
  Filled 2018-09-28 (×2): qty 1

## 2018-09-28 MED ORDER — CALCIUM GLUCONATE-NACL 1-0.675 GM/50ML-% IV SOLN: 1.0000 g | Freq: Once | INTRAVENOUS | Status: AC

## 2018-09-28 MED ORDER — SODIUM CHLORIDE 0.9 % IV SOLN
1.0000 g | Freq: Once | INTRAVENOUS | Status: DC
  Filled 2018-09-28: qty 10

## 2018-09-28 MED ORDER — DEXTROSE 50 % IV SOLN: 50.0000 mL | Freq: Once | INTRAVENOUS | Status: DC

## 2018-09-28 NOTE — Progress Notes (Signed)
[facility] Progress Note Patient Name: Mary Wilcox DOB: 08-Dec-1955 MRN: [id number]   Date of Service  09/28/2018  HPI/Events of Note  Pt seen in ED with marked hyperkalemia in the context of missed dialysis.  eICU Interventions  New patient evaluation completed.     Intervention Category Evaluation Type: New Patient Evaluation  Frederik Pear 09/28/2018, 11:39 PM

## 2018-09-28 NOTE — ED Notes (Signed)
MD Schlossman aware of CBG 49 and at bed side, will give amp D50, pt mentating well.

## 2018-09-28 NOTE — ED Notes (Addendum)
ED TO INPATIENT HANDOFF REPORT  ED Nurse Name and Phone #: Lorrin Goodell [phone]  S Name/Age/Gender Mary Wilcox 63 y.o. female Room/Bed: 040C/040C  Code Status   Code Status: Full Code  Home/SNF/Other Home Patient oriented to: self, place, time and situation Is this baseline? Yes   Triage Complete: Triage complete  Chief Complaint weakness  Triage Note C/o Generalized weakness and called EMS.  Pt missed dialysis yesterday d/t inability to walk for transport.  [address] today while attempting to go to the restroom and called Ems with her cell phone.C/o pain to R leg from 2 weeks ago twisting ankle.  Denies striking her head or LOC, no other pain noted.  Alert and oriented x 4.   Allergies Allergies  Allergen Reactions  . Prozac [Fluoxetine Hcl] Hives    Level of Care/Admitting Diagnosis ED Disposition    ED Disposition Condition Comment   Admit  Hospital Area: [facility] [[id number]]  Level of Care: ICU [6]  Covid Evaluation: Asymptomatic Screening Protocol (No Symptoms)  Diagnosis: Hyperkalemia [563875]  Admitting Physician: Kandice Hams [[id number]]  Attending Physician: Kandice Hams [[id number]]  Estimated length of stay: > 1 week  Certification:: I certify this patient will need inpatient services for at least 2 midnights  PT Class (Do Not Modify): Inpatient [101]  PT Acc Code (Do Not Modify): Private [1]       B Medical/Surgery History Past Medical History:  Diagnosis Date  . Bilateral hydronephrosis 2006   due to obstruction from cervical cancer  . Cervical cancer ([place]) 2006   IIIB s/p ext radiation and intracavitary cessium  . Chronic kidney disease    hx several episodes of ARF secondary to obstructive uropathy  . Insomnia    takes Ambien 3x wk  . Iron deficiency anemia   . Peripheral vascular disease ([place])   . Secondary hyperparathyroidism (of renal origin)   . Transfusion history    02/2011 for Hgb 6.8   Past Surgical  History:  Procedure Laterality Date  . AV FISTULA PLACEMENT  03/05/2011   Procedure: INSERTION OF ARTERIOVENOUS (AV) GORE-TEX GRAFT ARM;  Surgeon: Angelia Mould, MD;  Location: [facility];  Service: Vascular;  Laterality: Left;  start 1115   finish 1250  . INSERTION OF DIALYSIS CATHETER  03/05/2011   Procedure: INSERTION OF DIALYSIS CATHETER;  Surgeon: Angelia Mould, MD;  Location: [facility];  Service: Vascular;  Laterality: Right;  start 1041- finish 1051  . TUBAL LIGATION    . URETER SURGERY     stent, Dr. Risa Grill for bilateral hydro     A IV Location/Drains/Wounds Patient Lines/Drains/Airways Status   Active Line/Drains/Airways    Name:   Placement date:   Placement time:   Site:   Days:   Peripheral IV 09/28/18 Right Wrist   09/28/18    1837    Wrist   less than 1   Peripheral IV 09/28/18 Right Antecubital   09/28/18    1844    Antecubital   less than 1   Vascular Access Hemodialysis catheter Internal jugular Right   03/05/11    1051    Internal jugular   2764   Vascular Access Arteriovenous vein graft Upper arm Left   03/05/11    1210    Upper arm   2764   Incision 04/11/11 Arm Left;Upper   04/11/11    2000     2727   Incision 03/05/11 Shoulder Left   03/05/11  1253     2764   Incision 08/28/11 Arm Left   08/28/11    1307     2588   Wound 03/05/11 Other (Comment) Arm Left incision  a/v graft   03/05/11    2331    Arm   2764   Wound 03/11/11 Abrasion(s);Excoriated (Scratch marks) Buttocks excoriation on buttocks with reedness,genital area is red also   03/11/11    1400    Buttocks   2758          Intake/Output Last 24 hours  Intake/Output Summary (Last 24 hours) at 09/28/2018 2221 Last data filed at 09/28/2018 2120 Gross per 24 hour  Intake 50 ml  Output -  Net 50 ml    Labs/Imaging Results for orders placed or performed during the hospital encounter of 09/28/18 (from the past 48 hour(s))  Magnesium     Status: Abnormal   Collection Time: 09/28/18  6:43 PM   Result Value Ref Range   Magnesium 3.9 (H) 1.7 - 2.4 mg/dL    Comment: Performed at [facility], [address], [place], [place] [address]  Phosphorus     Status: Abnormal   Collection Time: 09/28/18  6:43 PM  Result Value Ref Range   Phosphorus 7.2 (H) 2.5 - 4.6 mg/dL    Comment: Performed at [facility] [address] [place], [place] [address]  CBC with Differential     Status: Abnormal   Collection Time: 09/28/18  6:43 PM  Result Value Ref Range   WBC 14.8 (H) 4.0 - 10.5 K/uL   RBC 4.40 3.87 - 5.11 MIL/uL   Hemoglobin 15.6 (H) 12.0 - 15.0 g/dL   HCT 47.0 (H) 36.0 - 46.0 %   MCV 106.8 (H) 80.0 - 100.0 fL   MCH 35.5 (H) 26.0 - 34.0 pg   MCHC 33.2 30.0 - 36.0 g/dL   RDW 14.6 11.5 - 15.5 %   Platelets 308 150 - 400 K/uL   nRBC 0.0 0.0 - 0.2 %   Neutrophils Relative % 85 %   Neutro Abs 12.4 (H) 1.7 - 7.7 K/uL   Lymphocytes Relative 7 %   Lymphs Abs 1.1 0.7 - 4.0 K/uL   Monocytes Relative 8 %   Monocytes Absolute 1.2 (H) 0.1 - 1.0 K/uL   Eosinophils Relative 0 %   Eosinophils Absolute 0.1 0.0 - 0.5 K/uL   Basophils Relative 0 %   Basophils Absolute 0.1 0.0 - 0.1 K/uL   Immature Granulocytes 0 %   Abs Immature Granulocytes 0.06 0.00 - 0.07 K/uL    Comment: Performed at [facility], [place] [address] [place],  [address]  Basic metabolic panel     Status: Abnormal   Collection Time: 09/28/18  6:43 PM  Result Value Ref Range   Sodium 134 (L) 135 - 145 mmol/L   Potassium >7.5 (HH) 3.5 - 5.1 mmol/L    Comment: SLIGHT HEMOLYSIS CRITICAL RESULT CALLED TO, READ BACK BY AND VERIFIED WITH: Francee Nodal ,RN 2000 09/28/2018 WBNDO    Chloride 90 (L) 98 - 111 mmol/L   CO2 24 22 - 32 mmol/L   Glucose, Bld 93 70 - 99 mg/dL   BUN 134 (H) 8 - 23 mg/dL   Creatinine, Ser 14.07 (H) 0.44 - 1.00 mg/dL   Calcium 10.8 (H) 8.9 - 10.3 mg/dL   GFR calc non Af Amer 2 (L) >60 mL/min   GFR calc Af Amer 3 (L) >60 mL/min  Anion gap 20 (H) 5 - 15    Comment: Performed at  [facility], [place] [address], [place], [place] [address]  Troponin I (High Sensitivity)     Status: Abnormal   Collection Time: 09/28/18  6:43 PM  Result Value Ref Range   Troponin I (High Sensitivity) 41 (H) <18 ng/L    Comment: (NOTE) Elevated high sensitivity troponin I (hsTnI) values and significant  changes across serial measurements may suggest ACS but many other  chronic and acute conditions are known to elevate hsTnI results.  Refer to the Links section for chest pain algorithms and additional  guidance. Performed at [facility], [place] [address], [place], [place] [address]   Hepatic function panel     Status: None   Collection Time: 09/28/18  6:43 PM  Result Value Ref Range   Total Protein 6.8 6.5 - 8.1 g/dL   Albumin 3.9 3.5 - 5.0 g/dL   AST 16 15 - 41 U/L   ALT 21 0 - 44 U/L   Alkaline Phosphatase 81 38 - 126 U/L   Total Bilirubin 0.5 0.3 - 1.2 mg/dL   Bilirubin, Direct <0.1 0.0 - 0.2 mg/dL   Indirect Bilirubin NOT CALCULATED 0.3 - 0.9 mg/dL    Comment: Performed at [facility] [address], [place], [place] [address]  Basic metabolic panel     Status: Abnormal   Collection Time: 09/28/18  8:18 PM  Result Value Ref Range   Sodium 133 (L) 135 - 145 mmol/L   Potassium >7.5 (HH) 3.5 - 5.1 mmol/L    Comment: SPECIMEN HEMOLYZED. HEMOLYSIS MAY AFFECT INTEGRITY OF RESULTS. CRITICAL RESULT CALLED TO, READ BACK BY AND VERIFIED WITH: Levell Tavano E,RN 09/28/18 2116 WAYK    Chloride 91 (L) 98 - 111 mmol/L   CO2 19 (L) 22 - 32 mmol/L   Glucose, Bld 116 (H) 70 - 99 mg/dL   BUN 134 (H) 8 - 23 mg/dL   Creatinine, Ser 13.61 (H) 0.44 - 1.00 mg/dL   Calcium 10.5 (H) 8.9 - 10.3 mg/dL   GFR calc non Af Amer 3 (L) >60 mL/min   GFR calc Af Amer 3 (L) >60 mL/min   Anion gap 23 (H) 5 - 15    Comment: Performed at [facility], [place] [address], [place], [place] [address]  CBG monitoring, ED     Status: Abnormal   Collection Time: 09/28/18  8:26 PM  Result Value Ref  Range   Glucose-Capillary 49 (L) 70 - 99 mg/dL  SARS Coronavirus 2 (CEPHEID - Performed in [facility] hospital lab), Hosp Order     Status: None   Collection Time: 09/28/18  8:35 PM   Specimen: Nasopharyngeal Swab  Result Value Ref Range   SARS Coronavirus 2 NEGATIVE NEGATIVE    Comment: (NOTE) If result is NEGATIVE SARS-CoV-2 target nucleic acids are NOT DETECTED. The SARS-CoV-2 RNA is generally detectable in upper and lower  respiratory specimens during the acute phase of infection. The lowest  concentration of SARS-CoV-2 viral copies this assay can detect is 250  copies / mL. A negative result does not preclude SARS-CoV-2 infection  and should not be used as the sole basis for treatment or other  patient management decisions.  A negative result may occur with  improper specimen collection / handling, submission of specimen other  than nasopharyngeal swab, presence of viral mutation(s) within the  areas targeted by this assay, and inadequate number of viral  copies  (<250 copies / mL). A negative result must be combined with clinical  observations, patient history, and epidemiological information. If result is POSITIVE SARS-CoV-2 target nucleic acids are DETECTED. The SARS-CoV-2 RNA is generally detectable in upper and lower  respiratory specimens dur ing the acute phase of infection.  Positive  results are indicative of active infection with SARS-CoV-2.  Clinical  correlation with patient history and other diagnostic information is  necessary to determine patient infection status.  Positive results do  not rule out bacterial infection or co-infection with other viruses. If result is PRESUMPTIVE POSTIVE SARS-CoV-2 nucleic acids MAY BE PRESENT.   A presumptive positive result was obtained on the submitted specimen  and confirmed on repeat testing.  While 2019 novel coronavirus  (SARS-CoV-2) nucleic acids may be present in the submitted sample  additional confirmatory testing may  be necessary for epidemiological  and / or clinical management purposes  to differentiate between  SARS-CoV-2 and other Sarbecovirus currently known to infect humans.  If clinically indicated additional testing with an alternate test  methodology [phone]) is advised. The SARS-CoV-2 RNA is generally  detectable in upper and lower respiratory sp ecimens during the acute  phase of infection. The expected result is Negative. Fact Sheet for Patients:  StrictlyIdeas.no Fact Sheet for Healthcare Providers: BankingDealers.co.za This test is not yet approved or cleared by the [place] FDA and has been authorized for detection and/or diagnosis of SARS-CoV-2 by FDA under an Emergency Use Authorization (EUA).  This EUA will remain in effect (meaning this test can be used) for the duration of the COVID-19 declaration under Section 564(b)(1) of the Act, 21 U.S.C. section 360bbb-3(b)(1), unless the authorization is terminated or revoked sooner. Performed at [facility], [place] [address], [place], [place] [address]    No results found.  Pending Labs Unresulted Labs (From admission, onward)    Start     Ordered   09/28/18 2300  CK  Once,   R     09/28/18 2300   09/28/18 2201  HIV antibody (Routine Testing)  Once,   STAT     09/28/18 2203          Vitals/Pain Today's Vitals   09/28/18 2105 09/28/18 2110 09/28/18 2115 09/28/18 2121  BP: (!) 142/54 (!) 135/50 138/61 102/85  Pulse:      Resp: 18 20 19  (!) 25  Temp:      TempSrc:      SpO2:      PainSc:        Isolation Precautions No active isolations  Medications Medications  Chlorhexidine Gluconate Cloth 2 % PADS 6 each (has no administration in time range)  sodium zirconium cyclosilicate (LOKELMA) packet 10 g (10 g Oral Given 09/28/18 2120)  sodium bicarbonate injection 50 mEq (50 mEq Intravenous Not Given 09/28/18 2212)  calcium gluconate 1 g/ 50 mL sodium chloride IVPB  (has no administration in time range)  heparin injection 5,000 Units (has no administration in time range)  sodium bicarbonate injection 50 mEq (50 mEq Intravenous Given 09/28/18 1846)  insulin aspart (novoLOG) injection 10 Units (10 Units Intravenous Given 09/28/18 1846)  dextrose 50 % solution 50 mL (50 mLs Intravenous Given 09/28/18 1846)  calcium gluconate 1 g/ 50 mL sodium chloride IVPB (0 mg Intravenous Stopped 09/28/18 1953)  calcium gluconate 1 g/ 50 mL sodium chloride IVPB (0 g Intravenous Stopped 09/28/18 2120)  sodium bicarbonate injection 50 mEq (50 mEq Intravenous Given 09/28/18 2016)  dextrose 50 %  solution 50 mL (50 mLs Intravenous Given 09/28/18 2033)  sodium bicarbonate injection 50 mEq (50 mEq Intravenous Given 09/28/18 2210)    Mobility walks High fall risk   Focused Assessments Renal Assessment Handoff:  Hemodialysis Schedule: Hemodialysis Schedule: Tuesday/Thursday/Saturday Last Hemodialysis date and time: 09/24/2018    Restricted appendage: left arm     R Recommendations: See Admitting Provider Note  Report given to: Theadora Rama, RN  Additional Notes:

## 2018-09-28 NOTE — ED Triage Notes (Signed)
C/o Generalized weakness and called EMS.  Pt missed dialysis yesterday d/t inability to walk for transport.  [address] today while attempting to go to the restroom and called Ems with her cell phone.C/o pain to R leg from 2 weeks ago twisting ankle.  Denies striking her head or LOC, no other pain noted.  Alert and oriented x 4.

## 2018-09-28 NOTE — ED Notes (Signed)
MD Schlossman aware of pulse in the 30's, new EKG given to MD. Will continue to monitor.

## 2018-09-28 NOTE — ED Provider Notes (Signed)
  Physical Exam  BP (!) 174/56   Pulse (!) 50   Temp 98.1 F (36.7 C) (Oral)   Resp (!) 26   SpO2 99%   Physical Exam  ED Course/Procedures     .Critical Care Performed by: Gareth Morgan, MD Authorized by: Gareth Morgan, MD   Critical care provider statement:    Critical care time (minutes):  60   Critical care was time spent personally by me on the following activities:  Discussions with consultants, evaluation of patient's response to treatment, examination of patient, ordering and performing treatments and interventions, ordering and review of laboratory studies, pulse oximetry, re-evaluation of patient's condition and obtaining history from patient or surrogate    MDM    Received care of patient at 7 PM from Dr. Wilson Singer.  Please see his note for prior history and physical exam.  Briefly this is a 63 year old female with a history of ESRD who presented with generalized weakness and missing dialysis yesterday.  Patient with widening of the QRS, peak T waves, and bradycardia on arrival to the emergency department.  She was empirically given bicarb, insulin, dextrose, and calcium for suspected hyperkalemia and labs are pending.  On my reevaluation, patient is again bradycardic to the 30s, however has normal blood pressure and normal mentation.  Ordered additional calcium and bicarb for treatment for suspected hyperkalemia.  Labs returned confirming hyperkalemia with a potassium of 7.5.  Consulted Dr. Royce Macadamia of nephrology regarding need for emergent dialysis.  Dr. Royce Macadamia agrees with patient's need for emergent dialysis, however as her COVID test is pending, she is unable to go to the dialysis unit, and must have dialysis in a room admitted to the hospital.  Discussed with critical care given patient's need for close monitoring, repeat doses of bicarb and calcium with severe hyperkalemia and bradycardia.  Patient also noted to have hypoglycemia after prior insulin administration, was  given an amp of D50. Ordered lokelma.  Critical care accepting patient for admission with plan for patient to receive dialysis on arrival to the ICU.  Due to patient being moved upstairs, it was found that her COVID test is negative.  Called the dialysis nurse, Lenna Sciara, to inquire if it would be better to bring her to dialysis, or bring her to the ICU.  She reports it would be best to bring her to the dialysis unit.  Patient brought to dialysis for emergent dialysis care.       Gareth Morgan, MD 09/29/18 [phone]

## 2018-09-28 NOTE — ED Provider Notes (Signed)
[facility] EMERGENCY DEPARTMENT Provider Note   CSN: [id number] Arrival date & time: 09/28/18  1826     History   Chief Complaint No chief complaint on file.   HPI Mary Wilcox is a 63 y.o. female.     HPI   63 year old female with generalized weakness.  She is felt this way since about Monday.  She did not go to dialysis yesterday because of this weakness.  She was last dialyzed on Saturday.  Today her weakness is to the point where she is too weak to even walk.  She reports some pain in her right leg after twisting it a couple weeks ago.  No acute pain otherwise.  No shortness of breath.  Does feel intermittently dizzy.  Denies room spinning sensation.  Does not feel short of breath.  Past Medical History:  Diagnosis Date  . Bilateral hydronephrosis 2006   due to obstruction from cervical cancer  . Cervical cancer ([place]) 2006   IIIB s/p ext radiation and intracavitary cessium  . Chronic kidney disease    hx several episodes of ARF secondary to obstructive uropathy  . Insomnia    takes Ambien 3x wk  . Iron deficiency anemia   . Peripheral vascular disease ([place])   . Secondary hyperparathyroidism (of renal origin)   . Transfusion history    02/2011 for Hgb 6.8    Patient Active Problem List   Diagnosis Date Noted  . Mechanical complication of other vascular device, implant, and graft 08/19/2011  . End stage renal disease ([place]) 08/05/2011  . Other complications due to renal dialysis device, implant, and graft 08/05/2011  . Secondary hyperparathyroidism (of renal origin)   . Anemia associated with chronic renal failure 03/06/2011  . Thrombocytosis ([place]) 03/06/2011  . Chronic kidney disease, stage V requiring chronic dialysis ([place]) 03/05/2011  . Metabolic acidosis 42/68/3419  . Hydronephrosis, bilateral 03/05/2011  . Chronic UTI 03/05/2011  . Leukocytosis 03/05/2011    Past Surgical History:  Procedure Laterality Date  . AV FISTULA PLACEMENT   03/05/2011   Procedure: INSERTION OF ARTERIOVENOUS (AV) GORE-TEX GRAFT ARM;  Surgeon: Angelia Mould, MD;  Location: [facility];  Service: Vascular;  Laterality: Left;  start 1115   finish 1250  . INSERTION OF DIALYSIS CATHETER  03/05/2011   Procedure: INSERTION OF DIALYSIS CATHETER;  Surgeon: Angelia Mould, MD;  Location: [facility];  Service: Vascular;  Laterality: Right;  start 1041- finish 1051  . TUBAL LIGATION    . URETER SURGERY     stent, Dr. Risa Grill for bilateral hydro     OB History   No obstetric history on file.      Home Medications    Prior to Admission medications   Medication Sig Start Date End Date Taking? Authorizing Provider  calcium acetate (PHOSLO) 667 MG capsule Take 667-1,334 mg by mouth 3 (three) times daily. two tablets with every meal and one with snacks 06/18/11   [provider]  ethyl chloride spray Apply 1 application topically. Use on dialysis days T, Th, Sat 05/19/11   [provider]  multivitamin (RENA-VIT) TABS tablet Take 1 tablet by mouth daily.      [provider]  zolpidem (AMBIEN) 5 MG tablet Take 5 mg by mouth at bedtime as needed. For sleep    [provider]    Family History Family History  Problem Relation Age of Onset  . Lung cancer Father   . Cancer Father   .  Cancer Mother        kidney and thyroid  . Heart disease Brother   . Diabetes Brother   . Hyperlipidemia Brother   . Hypertension Brother   . Other Brother        varicose vein    Social History Social History   Tobacco Use  . Smoking status: Never Smoker  . Smokeless tobacco: Never Used  Substance Use Topics  . Alcohol use: No  . Drug use: No     Allergies   Prozac [fluoxetine hcl]   Review of Systems Review of Systems  All systems reviewed and negative, other than as noted in HPI.  Physical Exam Updated Vital Signs BP (!) 174/56   Pulse (!) 45   Temp 98.1 F (36.7 C) (Oral)   Resp 12   SpO2 100%   Physical  Exam Vitals signs and nursing note reviewed.  Constitutional:      General: She is not in acute distress.    Appearance: She is well-developed.  HENT:     Head: Normocephalic and atraumatic.  Eyes:     General:        Right eye: No discharge.        Left eye: No discharge.     Conjunctiva/sclera: Conjunctivae normal.  Neck:     Musculoskeletal: Neck supple.  Cardiovascular:     Rate and Rhythm: Bradycardia present. Rhythm irregular.     Heart sounds: Normal heart sounds. No murmur. No friction rub. No gallop.      Comments: On monitor rhythm between sinus bradycardia in 40s and junctional rhythm into high 20s at times. Fistula LUE with thrill. Pulmonary:     Effort: Pulmonary effort is normal. No respiratory distress.     Breath sounds: Normal breath sounds.  Abdominal:     General: There is no distension.     Palpations: Abdomen is soft.     Tenderness: There is no abdominal tenderness.  Musculoskeletal:        General: No tenderness.  Skin:    General: Skin is warm and dry.  Neurological:     Mental Status: She is alert.     Comments: Strength 3/5 b/l LE. 5/5 b/l UE. Sensation intact to light touch. Speech clear. AOx3. Cn 2-12 intact.   Psychiatric:        Behavior: Behavior normal.        Thought Content: Thought content normal.      ED Treatments / Results  Labs (all labs ordered are listed, but only abnormal results are displayed) Labs Reviewed  MAGNESIUM  PHOSPHORUS  URINALYSIS, ROUTINE W REFLEX MICROSCOPIC  CBC WITH DIFFERENTIAL/PLATELET  BASIC METABOLIC PANEL  TROPONIN I (HIGH SENSITIVITY)    EKG None  Radiology No results found.  Procedures Procedures (including critical care time)  CRITICAL CARE Performed by: Virgel Manifold Total critical care time: 35 minutes Critical care time was exclusive of separately billable procedures and treating other patients. Critical care was necessary to treat or prevent imminent or life-threatening  deterioration. Critical care was time spent personally by me on the following activities: development of treatment plan with patient and/or surrogate as well as nursing, discussions with consultants, evaluation of patient's response to treatment, examination of patient, obtaining history from patient or surrogate, ordering and performing treatments and interventions, ordering and review of laboratory studies, ordering and review of radiographic studies, pulse oximetry and re-evaluation of patient's condition. c  Medications Ordered in ED Medications  calcium gluconate 1 g  in sodium chloride 0.9 % 100 mL IVPB (has no administration in time range)  sodium bicarbonate injection 50 mEq (has no administration in time range)  insulin aspart (novoLOG) injection 10 Units (has no administration in time range)  dextrose 50 % solution 50 mL (has no administration in time range)     Initial Impression / Assessment and Plan / ED Course  I have reviewed the triage vital signs and the nursing notes.  Pertinent labs & imaging results that were available during my care of the patient were reviewed by me and considered in my medical decision making (see chart for details).     62 year old female with generalized weakness.  I am concerned that she may be significantly hyperkalemic.  Multiple abnormal rhythm strips by EMS.  At times appear to have a junctional rhythm in the 30s.  EKG here in the emergency room appears to be sinus with a rate of 48, left bundle branch block and peaked T waves.     She was placed on Zoll pads. Empirically given Ca, bicarb and insulin. BP is ok and she her mentation is currently good. Labs ordered. Anticipate nephrology consultation and emergent HD.   She is symmetrically weak in LE. Upper extremities are stronger. She denies acute neck or back pain. I think most likely explanation at this point is severe metabolic derangement. If this proves to not be the case then will need  further neurologic work-up. She would be out of window for acute intervention for CVA anyways.      Final Clinical Impressions(s) / ED Diagnoses   Final diagnoses:  Hyperkalemia  ESRD (end stage renal disease) [facility])    ED Discharge Orders    None       Virgel Manifold, MD 09/28/18 1916

## 2018-09-28 NOTE — H&P (Addendum)
NAME:  Mary Wilcox, MRN:  [id number], DOB:  1955/06/23, LOS: 0 ADMISSION DATE:  09/28/2018, CONSULTATION DATE:  09/28/18 REFERRING MD:  Billy Fischer  CHIEF COMPLAINT:  Weakness   Brief History   Mary Wilcox is a 63 y.o. female who was admitted 7/15 with severe hyperkalemia after missing HD one day prior.  History of present illness   Mary Wilcox is a 63 y.o. female who has a PMH including but not limited to ESRD on HD TTS, last session Sat 7/11 (see "past medical history" for rest).  She presented to [facility] ED 7/15 due to weakness.  She had her last full dialysis Saturday 7/11 and starting 2 - 3 days later, began to feel weak.  She had a fall at her home on Tues 7/14 due to weakness and because of this, she missed scheduled dialysis that day.  She was able to crawl to the couch where she laid for most of the day.  Denies any loss of consciousness or focal deficits.  In ED, she was found to have K > 7.5 with bradycardia as low as 29.  She continued to complain of generalized weakness as well as LE weakness (had apparently seen PCP for this and was prescribed prednisone, unclear diagnosis or reason for prednisone).  She received temporizing measures in the ED and was evaluated by nephrology who plans for emergent HD tonight.  PCCM asked to admit to the ICU.  Past Medical History  ESRD on HD TTS, PVD, hyperparathyroidism, anemia.  Significant Hospital Events   7/15 > admit.  Consults:  Nephrology.  Procedures:  None.  Significant Diagnostic Tests:  None.  Micro Data:  SARS CoV2 7/15 >   Antimicrobials:  None.   Interim history/subjective:  Complains of generalized weakness and LE weakness.  Objective:  Blood pressure 102/85, pulse (!) 50, temperature 98.1 F (36.7 C), temperature source Oral, resp. rate (!) 25, SpO2 97 %.        Intake/Output Summary (Last 24 hours) at 09/28/2018 2213 Last data filed at 09/28/2018 2120 Gross per 24 hour  Intake 50 ml  Output -  Net  50 ml   There were no vitals filed for this visit.  Examination: General: Adult female, in NAD. Neuro: A&O x 3, no deficits. HEENT: [place]/AT. Sclerae anicteric.  EOMI. Cardiovascular: Bradycardic, regular, no M/R/G.  Lungs: Respirations even and unlabored.  CTA bilaterally, No W/R/R.  Abdomen: BS x 4, soft, NT/ND.  Musculoskeletal: LUE AVG with + thrill/bruit.  No gross deformities, no edema.   Old bruise to left knee. Skin: Intact, warm, no rashes.  Assessment & Plan:   Hyperkalemia - 2/2 missing scheduled HD 7/14.  S/p temporizing measures + Lokelma in ED. ESRD on HD TTS - last session Sat 7/11. AGMA - 2/2 above. - Nephrology following and planning for emergent HD tonight. - Follow BMP.  Sinus bradycardia - 2/2 above. - HD as above.  Hypoglycemia - s/p D50 in ED. - Additional 1amp D50 now. - CBG's q4hrs.  Troponin bump - suspect 2/2 demand. - Trend troponins.    Best Practice:  Diet: Renal. Pain/Anxiety/Delirium protocol (if indicated): N/A. VAP protocol (if indicated): N/A. DVT prophylaxis: SCD's / Heparin. GI prophylaxis: None. Glucose control: None. Mobility: Bedrest. Code Status: Full. Family Communication: None available. Disposition: ICU.  Labs   CBC: Recent Labs  Lab 09/28/18 1843  WBC 14.8*  NEUTROABS 12.4*  HGB 15.6*  HCT 47.0*  MCV 106.8*  PLT 308   Basic  Metabolic Panel: Recent Labs  Lab 09/28/18 1843 09/28/18 2018  NA 134* 133*  K >7.5* >7.5*  CL 90* 91*  CO2 24 19*  GLUCOSE 93 116*  BUN 134* 134*  CREATININE 14.07* 13.61*  CALCIUM 10.8* 10.5*  MG 3.9*  --   PHOS 7.2*  --    GFR: CrCl cannot be calculated (Unknown ideal weight.). Recent Labs  Lab 09/28/18 1843  WBC 14.8*   Liver Function Tests: Recent Labs  Lab 09/28/18 1843  AST 16  ALT 21  ALKPHOS 81  BILITOT 0.5  PROT 6.8  ALBUMIN 3.9   No results for input(s): LIPASE, AMYLASE in the last 168 hours. No results for input(s): AMMONIA in the last 168 hours. ABG     Component Value Date/Time   PHART 7.264 (L) 03/04/2011 2123   PCO2ART 17.5 (LL) 03/04/2011 2123   PO2ART 115.0 (H) 03/04/2011 2123   HCO3 7.9 (L) 03/04/2011 2123   TCO2 8 03/04/2011 2123   ACIDBASEDEF 17.0 (H) 03/04/2011 2123   O2SAT 98.0 03/04/2011 2123    Coagulation Profile: No results for input(s): INR, PROTIME in the last 168 hours. Cardiac Enzymes: No results for input(s): CKTOTAL, CKMB, CKMBINDEX, TROPONINI in the last 168 hours. HbA1C: No results found for: HGBA1C CBG: Recent Labs  Lab 09/28/18 2026  GLUCAP 49*    Review of Systems:   All negative; except for those that are bolded, which indicate positives.  Constitutional: weight loss, weight gain, night sweats, fevers, chills, fatigue, weakness.  HEENT: headaches, sore throat, sneezing, nasal congestion, post nasal drip, difficulty swallowing, tooth/dental problems, visual complaints, visual changes, ear aches. Neuro: difficulty with speech, weakness, numbness, ataxia. CV:  chest pain, orthopnea, PND, swelling in lower extremities, dizziness, palpitations, syncope.  Resp: cough, hemoptysis, dyspnea, wheezing. GI: heartburn, indigestion, abdominal pain, nausea, vomiting, diarrhea, constipation, change in bowel habits, loss of appetite, hematemesis, melena, hematochezia.  GU: dysuria, change in color of urine, urgency or frequency, flank pain, hematuria. MSK: joint pain or swelling, decreased range of motion. Psych: change in mood or affect, depression, anxiety, suicidal ideations, homicidal ideations. Skin: rash, itching, bruising.   Past medical history  She,  has a past medical history of Bilateral hydronephrosis (2006), Cervical cancer ([place]) (2006), Chronic kidney disease, Insomnia, Iron deficiency anemia, Peripheral vascular disease ([place]), Secondary hyperparathyroidism (of renal origin), and Transfusion history.   Surgical History    Past Surgical History:  Procedure Laterality Date  . AV FISTULA PLACEMENT   03/05/2011   Procedure: INSERTION OF ARTERIOVENOUS (AV) GORE-TEX GRAFT ARM;  Surgeon: Angelia Mould, MD;  Location: [facility];  Service: Vascular;  Laterality: Left;  start 1115   finish 1250  . INSERTION OF DIALYSIS CATHETER  03/05/2011   Procedure: INSERTION OF DIALYSIS CATHETER;  Surgeon: Angelia Mould, MD;  Location: [place] [facility];  Service: Vascular;  Laterality: Right;  start 1041- finish 1051  . TUBAL LIGATION    . URETER SURGERY     stent, Dr. Risa Grill for bilateral hydro     Social History   reports that she has never smoked. She has never used smokeless tobacco. She reports that she does not drink alcohol or use drugs.   Family history   Her family history includes Cancer in her father and mother; Diabetes in her brother; Heart disease in her brother; Hyperlipidemia in her brother; Hypertension in her brother; Lung cancer in her father; Other in her brother.   Allergies Allergies  Allergen Reactions  . Prozac [Fluoxetine Hcl] Hives  Home meds  Prior to Admission medications   Medication Sig Start Date End Date Taking? Authorizing Provider  calcium acetate (PHOSLO) 667 MG capsule Take 667-1,334 mg by mouth 3 (three) times daily. two tablets with every meal and one with snacks 06/18/11   [provider]  ethyl chloride spray Apply 1 application topically. Use on dialysis days T, Th, Sat 05/19/11   [provider]  multivitamin (RENA-VIT) TABS tablet Take 1 tablet by mouth daily.      [provider]  zolpidem (AMBIEN) 5 MG tablet Take 5 mg by mouth at bedtime as needed. For sleep    [provider]     Montey Hora, [facility] Pager: [phone].  If no answer, [phone] 09/28/2018, 10:13 PM

## 2018-09-28 NOTE — Consult Note (Signed)
[facility] Renal Consultation Note  Requesting MD: Schlossman Indication for Consultation:  Hyperkalemia, ESRD  Chief complaint: weakness  HPI:  Mary Wilcox is a 63 y.o. female with a history of end-stage renal disease on hemodialysis Tuesday Thursday Saturday at [facility] via left upper extremity AV graft who presented to the hospital with weakness.  The patient states that she fell on Tuesday morning and indicates that she crawled to the couch and laid on the couch and then later called EMS today who brought her to the hospital.  Her potassium is elevated here in the ER at greater than 7.5 and she has been bradycardic.  EKG widened QRS and peaked T waves.  There is slight hemolysis however repeat labs are same.  Heart rate has been lowest 29 here and improves transiently to the 60's at highest and mostly in 40s and some 30's.  Patient states that her weakness is improving.  The patient states that her last dialysis was on Saturday, 7/11.  I have notified the dialysis unit and placed stat orders for dialysis.  She has been ordered a SARS coronavirus test which was just collected and has not yet returned.  She denies any shortness of breath, fever, chest pain, cough, loss of taste or smell.   Due to the nature of this patient's uncertain [id number] status with isolation and in keeping with efforts to prevent the spread of infection and to conserve personal protective equipment the patient was called on the phone and examined through the window as below.    PMHx:   Past Medical History:  Diagnosis Date  . Bilateral hydronephrosis 2006   due to obstruction from cervical cancer  . Cervical cancer ([place]) 2006   IIIB s/p ext radiation and intracavitary cessium  . Chronic kidney disease    hx several episodes of ARF secondary to obstructive uropathy  . Insomnia    takes Ambien 3x wk  . Iron deficiency anemia   . Peripheral vascular disease ([place])   . Secondary  hyperparathyroidism (of renal origin)   . Transfusion history    02/2011 for Hgb 6.8  ESRD   Past Surgical History:  Procedure Laterality Date  . AV FISTULA PLACEMENT  03/05/2011   Procedure: INSERTION OF ARTERIOVENOUS (AV) GORE-TEX GRAFT ARM;  Surgeon: Angelia Mould, MD;  Location: [place];  Service: Vascular;  Laterality: Left;  start 1115   finish 1250  . INSERTION OF DIALYSIS CATHETER  03/05/2011   Procedure: INSERTION OF DIALYSIS CATHETER;  Surgeon: Angelia Mould, MD;  Location: [place];  Service: Vascular;  Laterality: Right;  start 1041- finish 1051  . TUBAL LIGATION    . URETER SURGERY     stent, Dr. Risa Grill for bilateral hydro    Family Hx:  Family History  Problem Relation Age of Onset  . Lung cancer Father   . Cancer Father   . Cancer Mother        kidney and thyroid  . Heart disease Brother   . Diabetes Brother   . Hyperlipidemia Brother   . Hypertension Brother   . Other Brother        varicose vein    Social History:  reports that she has never smoked. She has never used smokeless tobacco. She reports that she does not drink alcohol or use drugs.  Allergies:  Allergies  Allergen Reactions  . Prozac [Fluoxetine Hcl] Hives    Medications: Prior to Admission medications   Medication Sig Start  Date End Date Taking? Authorizing Provider  calcium acetate (PHOSLO) 667 MG capsule Take 667-1,334 mg by mouth 3 (three) times daily. two tablets with every meal and one with snacks 06/18/11   [provider]  ethyl chloride spray Apply 1 application topically. Use on dialysis days T, Th, Sat 05/19/11   [provider]  multivitamin (RENA-VIT) TABS tablet Take 1 tablet by mouth daily.      [provider]  zolpidem (AMBIEN) 5 MG tablet Take 5 mg by mouth at bedtime as needed. For sleep    [provider]   I have reviewed the patient's current medications as reported above.  She states that she is also on medication for her  thyroid, depression and does not have those medication doses. She takes midodrine 10 mg before dialysis and during dialysis.    Labs:  BMP Latest Ref Rng & Units 09/28/2018 08/28/2011 07/16/2011  Glucose 70 - 99 mg/dL 93 97 -  BUN 8 - 23 mg/dL 134(H) - -  Creatinine 0.44 - 1.00 mg/dL 14.07(H) - -  Sodium 135 - 145 mmol/L 134(L) 137 -  Potassium 3.5 - 5.1 mmol/L >7.5(HH) 4.5 5.5(H)  Chloride 98 - 111 mmol/L 90(L) - -  CO2 22 - 32 mmol/L 24 - -  Calcium 8.9 - 10.3 mg/dL 10.8(H) - -    Urinalysis    Component Value Date/Time   COLORURINE YELLOW 04/12/2011 0335   APPEARANCEUR TURBID (A) 04/12/2011 0335   LABSPEC 1.008 04/12/2011 0335   PHURINE 8.5 (H) 04/12/2011 0335   GLUCOSEU NEGATIVE 04/12/2011 0335   HGBUR LARGE (A) 04/12/2011 0335   BILIRUBINUR NEGATIVE 04/12/2011 0335   KETONESUR NEGATIVE 04/12/2011 0335   PROTEINUR 100 (A) 04/12/2011 0335   UROBILINOGEN 0.2 04/12/2011 0335   NITRITE NEGATIVE 04/12/2011 0335   LEUKOCYTESUR LARGE (A) 04/12/2011 0335    ROS:  Pertinent items noted in HPI and remainder of comprehensive ROS otherwise negative.  Physical Exam: Vitals:   09/28/18 2027 09/28/18 2045  BP: (!) 158/62 (!) 146/51  Pulse:    Resp: 17 11  Temp:    SpO2:       Due to the nature of this patient's suspected COVID-19 with isolation and in keeping with efforts to prevent the spread of infection and to conserve personal protective equipment, a physical exam was not personally performed.  [name] symptoms and exam were discussed in detail with the RN.  A chart review of other providers notes and the patient's lab work as well as review of other pertinent studies was performed.  Exam details from prior documentation were reviewed specifically and confirmed with the bedside nurse.  [facility] of service[facility]    General:  Elderly female in stretcher in NAD  HEENT: NCAT Heart: bradycardia - HR 44-48 on my exam with BP 142/54 Lungs: unlabored respirations on  room air  Abdomen: per nursing exam obese habitus; NT; BM earlier which staff have cleaned Extremities: per nursing exam no edema; LUE AVGraft   Skin: no rash on extremities exposed Neuro: alert and oriented x 3; conversant and provides  history  Assessment/Plan:  # Hyperkalemia  - HD STAT.  Orders placed and I have spoken with the HD unit  - ER states patient is moving to the ICU - s/p calcium gluconate x 2, s/p sodium bicarbonate x 2, s/p glucose/insulin   - s/p lokelma   # Bradycardia  - 2/2 hyperkalemia  - For HD as above and patient is moving  to the ICU  # ESRD  - HD stat  - normally TTS at [facility]   # Hypoglycemia  - Additional D50 provided as pt hypoglycemic with tx for hyperkalemia - rechecking glucose   # Weakness - ruling out COVID-19 - Felt secondary to hyperkalemia   # Fall  - imaging per primary team discretion    Claudia Desanctis 09/28/2018, 9:14 PM

## 2018-09-28 NOTE — ED Notes (Signed)
zoll pads applied to pt

## 2018-09-28 NOTE — ED Notes (Signed)
Pt had a large amount of BM on entire lower part of body. Cleaned pt's perineal area and legs.

## 2018-09-29 LAB — BASIC METABOLIC PANEL
Anion gap: 21 — ABNORMAL HIGH (ref 5–15)
Anion gap: 17 — ABNORMAL HIGH (ref 5–15)
BUN: 76 mg/dL — ABNORMAL HIGH (ref 8–23)
BUN: 42 mg/dL — ABNORMAL HIGH (ref 8–23)
CO2: 24 mmol/L (ref 22–32)
CO2: 27 mmol/L (ref 22–32)
Calcium: 9.4 mg/dL (ref 8.9–10.3)
Calcium: 9.5 mg/dL (ref 8.9–10.3)
Chloride: 91 mmol/L — ABNORMAL LOW (ref 98–111)
Chloride: 95 mmol/L — ABNORMAL LOW (ref 98–111)
Creatinine, Ser: 9.09 mg/dL — ABNORMAL HIGH (ref 0.44–1.00)
Creatinine, Ser: 6.76 mg/dL — ABNORMAL HIGH (ref 0.44–1.00)
GFR calc Af Amer: 5 mL/min — ABNORMAL LOW (ref >60)
GFR calc Af Amer: 7 mL/min — ABNORMAL LOW (ref >60)
GFR calc non Af Amer: 4 mL/min — ABNORMAL LOW (ref >60)
GFR calc non Af Amer: 6 mL/min — ABNORMAL LOW (ref >60)
Glucose, Bld: 89 mg/dL (ref 70–99)
Glucose, Bld: 58 mg/dL — ABNORMAL LOW (ref 70–99)
Potassium: 4.2 mmol/L (ref 3.5–5.1)
Potassium: 4.2 mmol/L (ref 3.5–5.1)
Sodium: 136 mmol/L (ref 135–145)
Sodium: 139 mmol/L (ref 135–145)

## 2018-09-29 LAB — GLUCOSE, CAPILLARY
Glucose-Capillary: 100 mg/dL — ABNORMAL HIGH (ref 70–99)
Glucose-Capillary: 108 mg/dL — ABNORMAL HIGH (ref 70–99)
Glucose-Capillary: 115 mg/dL — ABNORMAL HIGH (ref 70–99)
Glucose-Capillary: 123 mg/dL — ABNORMAL HIGH (ref 70–99)
Glucose-Capillary: 161 mg/dL — ABNORMAL HIGH (ref 70–99)
Glucose-Capillary: 162 mg/dL — ABNORMAL HIGH (ref 70–99)
Glucose-Capillary: 51 mg/dL — ABNORMAL LOW (ref 70–99)
Glucose-Capillary: 64 mg/dL — ABNORMAL LOW (ref 70–99)
Glucose-Capillary: 71 mg/dL (ref 70–99)
Glucose-Capillary: 97 mg/dL (ref 70–99)

## 2018-09-29 LAB — CBC
HCT: 40.7 % (ref 36.0–46.0)
Hemoglobin: 13.5 g/dL (ref 12.0–15.0)
MCH: 34.2 pg — ABNORMAL HIGH (ref 26.0–34.0)
MCHC: 33.2 g/dL (ref 30.0–36.0)
MCV: 103 fL — ABNORMAL HIGH (ref 80.0–100.0)
Platelets: 214 10*3/uL (ref 150–400)
RBC: 3.95 MIL/uL (ref 3.87–5.11)
RDW: 14.3 % (ref 11.5–15.5)
WBC: 8.5 10*3/uL (ref 4.0–10.5)
nRBC: 0 % (ref 0.0–0.2)

## 2018-09-29 LAB — MAGNESIUM: Magnesium: 2.4 mg/dL (ref 1.7–2.4)

## 2018-09-29 LAB — MRSA PCR SCREENING: MRSA by PCR: NEGATIVE

## 2018-09-29 LAB — TROPONIN I (HIGH SENSITIVITY): Troponin I (High Sensitivity): 38 ng/L — ABNORMAL HIGH (ref <18)

## 2018-09-29 LAB — PHOSPHORUS: Phosphorus: 4 mg/dL (ref 2.5–4.6)

## 2018-09-29 MED ORDER — SODIUM CHLORIDE 0.9 % IV SOLN: 100.0000 mL | INTRAVENOUS | Status: DC | PRN

## 2018-09-29 MED ORDER — DEXTROSE 10 % IV SOLN
INTRAVENOUS | Status: DC
  Administered 2018-09-29: 05:00:00 via INTRAVENOUS

## 2018-09-29 MED ORDER — LIDOCAINE HCL (PF) 1 % IJ SOLN: 5.0000 mL | INTRAMUSCULAR | Status: DC | PRN

## 2018-09-29 MED ORDER — HEPARIN SODIUM (PORCINE) 1000 UNIT/ML DIALYSIS: 1000.0000 [IU] | INTRAMUSCULAR | Status: DC | PRN

## 2018-09-29 MED ORDER — ACETAMINOPHEN 325 MG PO TABS
650.0000 mg | ORAL_TABLET | Freq: Four times a day (QID) | ORAL | Status: DC | PRN
  Administered 2018-09-29 – 2018-10-01 (×4): 650 mg via ORAL
  Filled 2018-09-29 (×3): qty 2

## 2018-09-29 MED ORDER — CHLORHEXIDINE GLUCONATE CLOTH 2 % EX PADS: 6.0000 | MEDICATED_PAD | Freq: Every day | CUTANEOUS | Status: DC

## 2018-09-29 MED ORDER — ALTEPLASE 2 MG IJ SOLR: 2.0000 mg | Freq: Once | INTRAMUSCULAR | Status: DC | PRN

## 2018-09-29 MED ORDER — DEXTROSE 50 % IV SOLN: 50.0000 mL | Freq: Once | INTRAVENOUS | Status: AC

## 2018-09-29 MED ORDER — DEXTROSE 50 % IV SOLN
INTRAVENOUS | Status: AC
  Administered 2018-09-29: 50 mL
  Filled 2018-09-29: qty 50

## 2018-09-29 MED ORDER — LIDOCAINE-PRILOCAINE 2.5-2.5 % EX CREA: 1.0000 "application " | TOPICAL_CREAM | CUTANEOUS | Status: DC | PRN

## 2018-09-29 MED ORDER — PENTAFLUOROPROP-TETRAFLUOROETH EX AERO: 1.0000 "application " | INHALATION_SPRAY | CUTANEOUS | Status: DC | PRN

## 2018-09-29 NOTE — Progress Notes (Signed)
MERYEM HAERTEL [id number] 04-Oct-1955  OP HD orders: AF - TTS 3hrs 45 min 400/800 EDW 76.5kg Profile 2 LU AVG Hep 4000 units qHD Hectorol 71mcg qHD

## 2018-09-29 NOTE — Progress Notes (Signed)
Subjective:  Completed HD overnight- K this AM 42-  Had low sugars, on d10- HR 66.  Still feeling weak- but now she says maybe due to her injury   Objective Vital signs in last 24 hours: Vitals:   09/29/18 0430 09/29/18 0445 09/29/18 0500 09/29/18 0600  BP: (!) 124/56 (!) 124/56 (!) 141/61 (!) 135/56  Pulse: (!) 51 (!) 53 (!) 52 (!) 53  Resp: (!) 21 20 (!) 21 20  Temp:  98.4 F (36.9 C)    TempSrc:  Oral    SpO2: 96% 95% 98% 96%  Weight:  81.3 kg     Weight change:   Intake/Output Summary (Last 24 hours) at 09/29/2018 1017 Last data filed at 09/29/2018 0445 Gross per 24 hour  Intake 50 ml  Output 1000 ml  Net -950 ml    Assessment/ Plan: Pt is a 63 y.o. yo female with ESRD who was admitted on 09/28/2018 with hyperkalemia and bradycardia after missed HD  Assessment/Plan: 1. Hyperkalemia- due to missed HD and bodily injury.  Resolved after HD 2. ESRD - normally TTS via AVG- got HD late last night, will plan for likely Friday and maybe Saturday to get back on schedule  3. Anemia- not an issue 4. Secondary hyperparathyroidism- calc/phos OK at present 5. HTN/volume- seems OK- not much fluid removal overnight- will work on more for tomorrow  6.  Hypoglycemia- currently on d 10 - per primary  7.  Injury- CK only [place]: Basic Metabolic Panel: Recent Labs  Lab 09/28/18 1843 09/28/18 2018 09/29/18 0214 09/29/18 0438  NA 134* 133* 136 139  K >7.5* >7.5* 4.2 4.2  CL 90* 91* 91* 95*  CO2 24 19* 24 27  GLUCOSE 93 116* 89 58*  BUN 134* 134* 76* 42*  CREATININE 14.07* 13.61* 9.09* 6.76*  CALCIUM 10.8* 10.5* 9.4 9.5  PHOS 7.2*  --   --  4.0   Liver Function Tests: Recent Labs  Lab 09/28/18 1843  AST 16  ALT 21  ALKPHOS 81  BILITOT 0.5  PROT 6.8  ALBUMIN 3.9   No results for input(s): LIPASE, AMYLASE in the last 168 hours. No results for input(s): AMMONIA in the last 168 hours. CBC: Recent Labs  Lab 09/28/18 1843 09/29/18 0438  WBC  14.8* 8.5  NEUTROABS 12.4*  --   HGB 15.6* 13.5  HCT 47.0* 40.7  MCV 106.8* 103.0*  PLT 308 214   Cardiac Enzymes: Recent Labs  Lab 09/28/18 2018  CKTOTAL 113   CBG: Recent Labs  Lab 09/28/18 2026 09/29/18 0003 09/29/18 0026 09/29/18 0308 09/29/18 0605  GLUCAP 49* 51* 161* 71 64*    Iron Studies: No results for input(s): IRON, TIBC, TRANSFERRIN, FERRITIN in the last 72 hours. Studies/Results: No results found. Medications: Infusions: . sodium chloride    . sodium chloride    . dextrose 50 mL/hr at 09/29/18 0528    Scheduled Medications: . Chlorhexidine Gluconate Cloth  6 each Topical Q0600  . dextrose  50 mL Intravenous Once  . heparin  5,000 Units Subcutaneous Q8H  . sodium bicarbonate  50 mEq Intravenous Once  . sodium zirconium cyclosilicate  10 g Oral Daily    have reviewed scheduled and prn medications.  Physical Exam: General: NAD Heart: RRR Lungs: mostly clear Abdomen: soft, non tender Extremities: min edema Dialysis Access:  Left  upper arm AVG- patent     09/29/2018,6:13 AM  LOS: 1 day

## 2018-09-29 NOTE — Progress Notes (Signed)
[facility] Progress Note Patient Name: Mary Wilcox DOB: 08/31/1955 MRN: [id number]   Date of Service  09/29/2018  HPI/Events of Note  Hypoglycemia  eICU Interventions  D 10 W started at 50 ml/hour        Pandora Mccrackin U E[facility] [place] 09/29/2018, 5:25 AM

## 2018-09-29 NOTE — Evaluation (Signed)
Physical Therapy Evaluation Patient Details Name: Mary Wilcox MRN: [id number] DOB: 1955/03/29 Today's Date: 09/29/2018   History of Present Illness  Pt adm with hyperkalemia after missed HD. Pt had fall at home. PMH - esrd on hd, PVD  Clinical Impression  Pt presents to PT with decr mobility due to weakness and decr balance. Expect she will progress quickly. Lives alone with little support so will need to be fairly independent prior to return home. Will try rollator vs rolling walker for possible home DME.     Follow Up Recommendations Home health PT    Equipment Recommendations  Rolling walker with 5" wheels;Other (comment)(vs rollator)    Recommendations for Other Services OT consult     Precautions / Restrictions Precautions Precautions: Fall      Mobility  Bed Mobility Overal bed mobility: Needs Assistance Bed Mobility: Supine to Sit     Supine to sit: Min assist     General bed mobility comments: assist to elevate trunk into sitting  Transfers Overall transfer level: Needs assistance Equipment used: Rolling walker (2 wheeled) Transfers: Sit to/from Stand Sit to Stand: Min guard         General transfer comment: Assist for safety. Verbal cues for hand placement  Ambulation/Gait Ambulation/Gait assistance: Min guard Gait Distance (Feet): 80 Feet Assistive device: Rolling walker (2 wheeled) Gait Pattern/deviations: Step-through pattern;Decreased stride length Gait velocity: decr Gait velocity interpretation: <1.31 ft/sec, indicative of household ambulator General Gait Details: Assist for lines/safety  Stairs            Wheelchair Mobility    Modified Rankin (Stroke Patients Only)       Balance Overall balance assessment: Needs assistance Sitting-balance support: No upper extremity supported;Feet supported Sitting balance-Leahy Scale: Fair     Standing balance support: Single extremity supported Standing balance-Leahy Scale:  Poor Standing balance comment: UE support                             Pertinent Vitals/Pain Pain Assessment: No/denies pain    Home Living Family/patient expects to be discharged to:: Private residence Living Arrangements: Alone   Type of Home: Apartment Home Access: Stairs to enter Entrance Stairs-Rails: Right Entrance Stairs-Number of Steps: flight Home Layout: One level Home Equipment: None      Prior Function Level of Independence: Independent         Comments: Uses [employer] for transportation. Does her own cooking, cleaning, and shopping     Hand Dominance        Extremity/Trunk Assessment   Upper Extremity Assessment Upper Extremity Assessment: Defer to OT evaluation    Lower Extremity Assessment Lower Extremity Assessment: Generalized weakness       Communication   Communication: No difficulties  Cognition Arousal/Alertness: Awake/alert Behavior During Therapy: WFL for tasks assessed/performed Overall Cognitive Status: Within Functional Limits for tasks assessed                                        General Comments      Exercises     Assessment/Plan    PT Assessment Patient needs continued PT services  PT Problem List Decreased strength;Decreased activity tolerance;Decreased balance;Decreased mobility;Decreased knowledge of use of DME       PT Treatment Interventions DME instruction;Gait training;Stair training;Functional mobility training;Therapeutic activities;Therapeutic exercise;Balance training;Patient/family education    PT Goals (Current  goals can be found in the Care Plan section)  Acute Rehab PT Goals Patient Stated Goal: return home PT Goal Formulation: With patient Time For Goal Achievement: 10/13/18 Potential to Achieve Goals: Good    Frequency Min 3X/week   Barriers to discharge Inaccessible home environment;Decreased caregiver support Lives alone in second story apartment    Co-evaluation                AM-PAC PT "6 Clicks" Mobility  Outcome Measure Help needed turning from your back to your side while in a flat bed without using bedrails?: A Little Help needed moving from lying on your back to sitting on the side of a flat bed without using bedrails?: A Little Help needed moving to and from a bed to a chair (including a wheelchair)?: A Little Help needed standing up from a chair using your arms (e.g., wheelchair or bedside chair)?: A Little Help needed to walk in hospital room?: A Little Help needed climbing 3-5 steps with a railing? : A Little 6 Click Score: 18    End of Session Equipment Utilized During Treatment: Gait belt Activity Tolerance: Patient tolerated treatment well Patient left: in chair;with call bell/phone within reach Nurse Communication: Mobility status PT Visit Diagnosis: Unsteadiness on feet (R26.81);Muscle weakness (generalized) (M62.81)    Time: 9169-4503 PT Time Calculation (min) (ACUTE ONLY): 16 min   Charges:   PT Evaluation $PT Eval Moderate Complexity: [place] Pager [phone] Office [place] 09/29/2018, 5:45 PM

## 2018-09-29 NOTE — Progress Notes (Signed)
Renal Navigator notified OP HD clinic/SW of patient's admission and negative COVID 19 test result in order to provide continuity of care.  Alphonzo Cruise, [place] Renal Navigator [phone]

## 2018-09-29 NOTE — Progress Notes (Addendum)
NAME:  Mary Wilcox, MRN:  [id number], DOB:  1956/01/27, LOS: 1 ADMISSION DATE:  09/28/2018, CONSULTATION DATE:  09/28/18 REFERRING MD:  Billy Fischer  CHIEF COMPLAINT:  Weakness   Brief History   Mary Wilcox is a 63 y.o. female who was admitted 7/15 with severe hyperkalemia after missing HD one day prior.  History of present illness   Mary Wilcox is a 63 y.o. female who has a PMH including but not limited to ESRD on HD TTS, last session Sat 7/11 (see "past medical history" for rest).  She presented to [facility] ED 7/15 due to weakness.  She had her last full dialysis Saturday 7/11 and starting 2 - 3 days later, began to feel weak.  She had a fall at her home on Tues 7/14 due to weakness and because of this, she missed scheduled dialysis that day.  She was able to crawl to the couch where she laid for most of the day.  Denies any loss of consciousness or focal deficits.  In ED, she was found to have K > 7.5 with bradycardia as low as 29.  She continued to complain of generalized weakness as well as LE weakness (had apparently seen PCP for this and was prescribed prednisone, unclear diagnosis or reason for prednisone).  She received temporizing measures in the ED and was evaluated by nephrology who plans for emergent HD tonight.  PCCM asked to admit to the ICU.  Past Medical History  ESRD on HD TTS, PVD, hyperparathyroidism, anemia.  Significant Hospital Events   7/15 > admit.  Consults:  Nephrology.  Procedures:  None.  Significant Diagnostic Tests:  None.  Micro Data:  SARS CoV2 7/15 > negative  Antimicrobials:  None.   Interim history/subjective:  Complains of hunger and weakness difficulty walking  Objective:  Blood pressure (!) 117/48, pulse (!) 52, temperature 98.4 F (36.9 C), temperature source Oral, resp. rate 19, weight 81.3 kg, SpO2 97 %.        Intake/Output Summary (Last 24 hours) at 09/29/2018 0826 Last data filed at 09/29/2018 0700 Gross per 24 hour   Intake 149.37 ml  Output 1000 ml  Net -850.63 ml   Filed Weights   09/29/18 0110 09/29/18 0445  Weight: 82.4 kg 81.3 kg    Examination: General: Obese female no acute distress HEENT: No JVD or lymphadenopathy is appreciated Neuro: Grossly intact CV: s1s2 rrr, no m/r/g PULM: even/non-labored, lungs bilaterally clear TJ:QZES, non-tender, bsx4 active  Extremities: Right upper extremity with ecchymosis from fall without any overt injury.  Left bicep AV graft patent with bruit and thrill Skin: no rashes or lesions   Assessment & Plan:   Hyperkalemia - 2/2 missing scheduled HD 7/14.  S/p temporizing measures + [place] in ED. ESRD on HD TTS - last session Sat 7/11. AGMA - 2/2 above. Status post hemodialysis 1 kg removed Renal is now following  Sinus bradycardia - 2/2 above. - HD as above.  Hypoglycemia - s/p D50 in ED. Currently on D50 Start renal diet Monitor CBG Transition off D50 once she starts diet  Troponin bump - suspect 2/2 demand. Monitor troponin     Best Practice:  Diet: Renal. Pain/Anxiety/Delirium protocol (if indicated): N/A. VAP protocol (if indicated): N/A. DVT prophylaxis: SCD's / Heparin. GI prophylaxis: None. Glucose control: None. Mobility: Bedrest. Code Status: Full. Family Communication: None available. Disposition: ICU.  09/28/2025 transition out of the intensive care unit and to [facility] service.M. Arrien aware.  Labs   CBC: Recent  Labs  Lab 09/28/18 1843 09/29/18 0438  WBC 14.8* 8.5  NEUTROABS 12.4*  --   HGB 15.6* 13.5  HCT 47.0* 40.7  MCV 106.8* 103.0*  PLT 308 948   Basic Metabolic Panel: Recent Labs  Lab 09/28/18 1843 09/28/18 2018 09/29/18 0214 09/29/18 0438  NA 134* 133* 136 139  K >7.5* >7.5* 4.2 4.2  CL 90* 91* 91* 95*  CO2 24 19* 24 27  GLUCOSE 93 116* 89 58*  BUN 134* 134* 76* 42*  CREATININE 14.07* 13.61* 9.09* 6.76*  CALCIUM 10.8* 10.5* 9.4 9.5  MG 3.9*  --   --  2.4  PHOS 7.2*  --   --  4.0   GFR: CrCl  cannot be calculated (Unknown ideal weight.). Recent Labs  Lab 09/28/18 1843 09/29/18 0438  WBC 14.8* 8.5   Liver Function Tests: Recent Labs  Lab 09/28/18 1843  AST 16  ALT 21  ALKPHOS 81  BILITOT 0.5  PROT 6.8  ALBUMIN 3.9   No results for input(s): LIPASE, AMYLASE in the last 168 hours. No results for input(s): AMMONIA in the last 168 hours. ABG    Component Value Date/Time   PHART 7.264 (L) 03/04/2011 2123   PCO2ART 17.5 (LL) 03/04/2011 2123   PO2ART 115.0 (H) 03/04/2011 2123   HCO3 7.9 (L) 03/04/2011 2123   TCO2 8 03/04/2011 2123   ACIDBASEDEF 17.0 (H) 03/04/2011 2123   O2SAT 98.0 03/04/2011 2123    Coagulation Profile: No results for input(s): INR, PROTIME in the last 168 hours. Cardiac Enzymes: Recent Labs  Lab 09/28/18 2018  CKTOTAL 113   HbA1C: No results found for: HGBA1C CBG: Recent Labs  Lab 09/29/18 0026 09/29/18 0308 09/29/18 0605 09/29/18 0625 09/29/18 0731  GLUCAP 161* 71 64* 162* 123*        Steve Akeisha Lagerquist ACNP Maryanna Shape PCCM Pager [phone] till 1 pm If no answer page [phone] 09/29/2018, 8:26 AM

## 2018-09-29 NOTE — Progress Notes (Signed)
NEW ADMISSION NOTE New Admission Note:   Arrival Method: Patient arrived in w/c from 78M accompanied by the staff. Mental Orientation: Alert and oriented x 4. Telemetry:  5M07 Assessment: Completed Skin:  MASD on the sacrum, ecchymosis on the arms, knee and right foot. IV:  R W and AC, SL. Pain: Denies any pain. Tubes:  N/A Safety Measures: Safety Fall Prevention Plan has been given, discussed and signed Admission: Completed [facility] [place] Orientation: Patient has been orientated to the room, unit and staff.  Family:  Orders have been reviewed and implemented. Will continue to monitor the patient. Call light has been placed within reach and bed alarm has been activated.   Amaryllis Dyke, RN

## 2018-09-30 ENCOUNTER — Encounter (HOSPITAL_COMMUNITY): Payer: Self-pay

## 2018-09-30 ENCOUNTER — Inpatient Hospital Stay (HOSPITAL_COMMUNITY): Payer: Medicare Other

## 2018-09-30 DIAGNOSIS — L538 Other specified erythematous conditions: Secondary | ICD-10-CM

## 2018-09-30 DIAGNOSIS — Z992 Dependence on renal dialysis: Secondary | ICD-10-CM

## 2018-09-30 DIAGNOSIS — M7989 Other specified soft tissue disorders: Secondary | ICD-10-CM

## 2018-09-30 DIAGNOSIS — R52 Pain, unspecified: Secondary | ICD-10-CM

## 2018-09-30 DIAGNOSIS — D631 Anemia in chronic kidney disease: Secondary | ICD-10-CM

## 2018-09-30 LAB — BASIC METABOLIC PANEL WITH GFR
Anion gap: 18 — ABNORMAL HIGH (ref 5–15)
BUN: 69 mg/dL — ABNORMAL HIGH (ref 8–23)
CO2: 23 mmol/L (ref 22–32)
Calcium: 8.5 mg/dL — ABNORMAL LOW (ref 8.9–10.3)
Chloride: 90 mmol/L — ABNORMAL LOW (ref 98–111)
Creatinine, Ser: 10.59 mg/dL — ABNORMAL HIGH (ref 0.44–1.00)
GFR calc Af Amer: 4 mL/min — ABNORMAL LOW
GFR calc non Af Amer: 3 mL/min — ABNORMAL LOW
Glucose, Bld: 72 mg/dL (ref 70–99)
Potassium: 5.9 mmol/L — ABNORMAL HIGH (ref 3.5–5.1)
Sodium: 131 mmol/L — ABNORMAL LOW (ref 135–145)

## 2018-09-30 LAB — CBC WITH DIFFERENTIAL/PLATELET
Abs Immature Granulocytes: 0.05 10*3/uL (ref 0.00–0.07)
Basophils Absolute: 0 10*3/uL (ref 0.0–0.1)
Basophils Relative: 1 %
Eosinophils Absolute: 0.3 10*3/uL (ref 0.0–0.5)
Eosinophils Relative: 3 %
HCT: 38 % (ref 36.0–46.0)
Hemoglobin: 12.8 g/dL (ref 12.0–15.0)
Immature Granulocytes: 1 %
Lymphocytes Relative: 20 %
Lymphs Abs: 1.5 10*3/uL (ref 0.7–4.0)
MCH: 34.9 pg — ABNORMAL HIGH (ref 26.0–34.0)
MCHC: 33.7 g/dL (ref 30.0–36.0)
MCV: 103.5 fL — ABNORMAL HIGH (ref 80.0–100.0)
Monocytes Absolute: 1.1 10*3/uL — ABNORMAL HIGH (ref 0.1–1.0)
Monocytes Relative: 14 %
Neutro Abs: 4.7 10*3/uL (ref 1.7–7.7)
Neutrophils Relative %: 61 %
Platelets: 173 10*3/uL (ref 150–400)
RBC: 3.67 MIL/uL — ABNORMAL LOW (ref 3.87–5.11)
RDW: 14 % (ref 11.5–15.5)
WBC: 7.6 10*3/uL (ref 4.0–10.5)
nRBC: 0 % (ref 0.0–0.2)

## 2018-09-30 LAB — GLUCOSE, CAPILLARY
Glucose-Capillary: 118 mg/dL — ABNORMAL HIGH (ref 70–99)
Glucose-Capillary: 73 mg/dL (ref 70–99)
Glucose-Capillary: 85 mg/dL (ref 70–99)
Glucose-Capillary: 85 mg/dL (ref 70–99)
Glucose-Capillary: 94 mg/dL (ref 70–99)

## 2018-09-30 LAB — MAGNESIUM: Magnesium: 2.5 mg/dL — ABNORMAL HIGH (ref 1.7–2.4)

## 2018-09-30 LAB — PHOSPHORUS: Phosphorus: 7.1 mg/dL — ABNORMAL HIGH (ref 2.5–4.6)

## 2018-09-30 MED ORDER — CHLORHEXIDINE GLUCONATE CLOTH 2 % EX PADS: 6.0000 | MEDICATED_PAD | Freq: Every day | CUTANEOUS | Status: DC

## 2018-09-30 MED ORDER — CEPHALEXIN 500 MG PO CAPS
500.0000 mg | ORAL_CAPSULE | ORAL | Status: DC
  Administered 2018-09-30 – 2018-10-01 (×2): 500 mg via ORAL
  Filled 2018-09-30 (×2): qty 1

## 2018-09-30 NOTE — TOC Initial Note (Signed)
Transition of Care [facility]) - Initial/Assessment Note    Patient Details  Name: Mary Wilcox MRN: [id number] Date of Birth: 1955/07/06  Transition of Care [facility]) CM/SW Contact:    Bartholomew Crews, RN Phone Number: [phone] 09/30/2018, 11:35 AM  Clinical Narrative:                 Spoke with patient at bedside. PTA home alone. Has support system of people who check on her. Uses [employer] for transportation. States that she could benefit from someone helping her with some basic chores once or twice a week. Discussed Tax adviser for needed resources. Patient agreed to follow. No problems with medications - obtaining, affording, or taking as directed. Discussed HH PT recommendations. Offered choice. Patient wanted to look over list, and agreed to CM coming back for decision. Anticipate transition home tomorrow after hemodialysis. CM to follow for transition of care needs.   Expected Discharge Plan: [place] Barriers to Discharge: Continued Medical Work up   Patient Goals and CMS Choice   CMS Medicare.gov Compare Post Acute Care list provided to:: Patient Choice offered to / list presented to : Patient  Expected Discharge Plan and Services Expected Discharge Plan: [place] In-house Referral: NA Discharge Planning Services: CM Consult Post Acute Care Choice: [place] arrangements for the past 2 months: Apartment Expected Discharge Date: 09/30/18               DME Arranged: N/A DME Agency: NA       HH Arranged: PT          Prior Living Arrangements/Services Living arrangements for the past 2 months: Apartment Lives with:: Self Patient language and need for interpreter reviewed:: Yes Do you feel safe going back to the place where you live?: Yes            Criminal Activity/Legal Involvement Pertinent to Current Situation/Hospitalization: No - Comment as needed  Activities of Daily Living Home Assistive  Devices/Equipment: None ADL Screening (condition at time of admission) [name] cognitive ability adequate to safely complete daily activities?: Yes Is the patient deaf or have difficulty hearing?: No Does the patient have difficulty seeing, even when wearing glasses/contacts?: No Does the patient have difficulty concentrating, remembering, or making decisions?: No Patient able to express need for assistance with ADLs?: No Does the patient have difficulty dressing or bathing?: No Independently performs ADLs?: Yes (appropriate for developmental age) Does the patient have difficulty walking or climbing stairs?: No Weakness of Legs: None Weakness of Arms/Hands: None  Permission Sought/Granted                  Emotional Assessment Appearance:: Appears stated age Attitude/Demeanor/Rapport: Engaged Affect (typically observed): Accepting Orientation: : Oriented to Situation, Oriented to  Time, Oriented to Place, Oriented to Self Alcohol / Substance Use: Not Applicable Psych Involvement: No (comment)  Admission diagnosis:  Hyperkalemia [E87.5] Bradycardia [R00.1] ESRD (end stage renal disease) ([place]) [N18.6] Patient Active Problem List   Diagnosis Date Noted  . Hyperkalemia 09/28/2018  . Bradycardia   . Mechanical complication of other vascular device, implant, and graft 08/19/2011  . ESRD (end stage renal disease) ([place]) 08/05/2011  . Other complications due to renal dialysis device, implant, and graft 08/05/2011  . Secondary hyperparathyroidism (of renal origin)   . Anemia associated with chronic renal failure 03/06/2011  . Thrombocytosis ([place]) 03/06/2011  . Chronic kidney disease, stage V requiring chronic dialysis ([place]) 03/05/2011  .  High anion gap metabolic acidosis 58/94/8347  . Hydronephrosis, bilateral 03/05/2011  . Chronic UTI 03/05/2011  . Leukocytosis 03/05/2011   PCP:  Kristen Loader, FNP Pharmacy:   [id number] - [place], [place] [address] Phone: [phone] Fax: [phone]     Social Determinants of Health (SDOH) Interventions    Readmission Risk Interventions No flowsheet data found.

## 2018-09-30 NOTE — Progress Notes (Signed)
Physical Therapy Treatment Patient Details Name: Mary Wilcox MRN: [id number] DOB: [birth year] Today's Date: 09/30/2018    History of Present Illness Pt adm with hyperkalemia after missed HD. Pt had fall at home. PMH - esrd on hd, PVD    PT Comments    Pt making good progress. Pt undecided about wanting a rollator for home. Pt will think about it.   Follow Up Recommendations  Home health PT     Equipment Recommendations  Other (comment)(possibly rollator. Pt wants to think about it)    Recommendations for Other Services OT consult     Precautions / Restrictions Precautions Precautions: Fall    Mobility  Bed Mobility Overal bed mobility: Modified Independent Bed Mobility: Supine to Sit     Supine to sit: Modified independent (Device/Increase time);HOB elevated        Transfers Overall transfer level: Needs assistance Equipment used: 4-wheeled walker;None Transfers: Sit to/from Stand Sit to Stand: Supervision         General transfer comment: Initial verbal cues when standing with rollator.   Ambulation/Gait Ambulation/Gait assistance: Supervision Gait Distance (Feet): 225 Feet Assistive device: 4-wheeled walker;None Gait Pattern/deviations: Step-through pattern;Decreased stride length Gait velocity: decr Gait velocity interpretation: 1.31 - 2.62 ft/sec, indicative of limited community ambulator General Gait Details: Pt with steady gait in hallway using rollator. Amb some in room without assistive device and using furniture to steady.    Stairs             Wheelchair Mobility    Modified Rankin (Stroke Patients Only)       Balance Overall balance assessment: Needs assistance Sitting-balance support: No upper extremity supported;Feet supported Sitting balance-Leahy Scale: Good     Standing balance support: No upper extremity supported;During functional activity Standing balance-Leahy Scale: Fair                              Cognition Arousal/Alertness: Awake/alert Behavior During Therapy: WFL for tasks assessed/performed Overall Cognitive Status: Within Functional Limits for tasks assessed                                        Exercises      General Comments        Pertinent Vitals/Pain Pain Assessment: No/denies pain    Home Living                      Prior Function            PT Goals (current goals can now be found in the care plan section) Acute Rehab PT Goals Patient Stated Goal: return home Progress towards PT goals: Progressing toward goals    Frequency    Min 3X/week      PT Plan Current plan remains appropriate    Co-evaluation              AM-PAC PT "6 Clicks" Mobility   Outcome Measure  Help needed turning from your back to your side while in a flat bed without using bedrails?: None Help needed moving from lying on your back to sitting on the side of a flat bed without using bedrails?: A Little Help needed moving to and from a bed to a chair (including a wheelchair)?: None Help needed standing up from a chair using your arms (e.g., wheelchair or bedside chair)?:  None Help needed to walk in hospital room?: A Little Help needed climbing 3-5 steps with a railing? : A Little 6 Click Score: 21    End of Session   Activity Tolerance: Patient tolerated treatment well Patient left: in chair;with call bell/phone within reach;with chair alarm set Nurse Communication: Mobility status PT Visit Diagnosis: Unsteadiness on feet (R26.81);Muscle weakness (generalized) (M62.81)     Time: 7847-8412 PT Time Calculation (min) (ACUTE ONLY): 17 min  Charges:  $Gait Training: 8-22 mins                     [place] Pager [phone] Office [place] 09/30/2018, 10:22 AM

## 2018-09-30 NOTE — Progress Notes (Signed)
PHARMACY NOTE:  ANTIMICROBIAL RENAL DOSAGE ADJUSTMENT  Current antimicrobial regimen includes a mismatch between antimicrobial dosage and estimated renal function.  As per policy approved by the Pharmacy & Therapeutics and Medical Executive Committees, the antimicrobial dosage will be adjusted accordingly.  Current antimicrobial dosage:  Cephalexin 500mg  PO q12h  Indication: cellulitis  Renal Function:  est CrCl <10 ml/min []      On intermittent HD, scheduled: []      On CRRT    Antimicrobial dosage has been changed to:  Cephalexin 500mg  PO q24h  Jemery Stacey A. Levada Dy, PharmD, [place] Please utilize Amion for appropriate phone number to reach the unit pharmacist ([place])   09/30/2018 1:32 PM

## 2018-09-30 NOTE — Plan of Care (Signed)
  Problem: Education: Goal: Knowledge of General Education information will improve Description: Including pain rating scale, medication(s)/side effects and non-pharmacologic comfort measures Outcome: Completed/Met   Problem: Health Behavior/Discharge Planning: Goal: Ability to manage health-related needs will improve Outcome: Completed/Met   Problem: Clinical Measurements: Goal: Ability to maintain clinical measurements within normal limits will improve Outcome: Completed/Met Goal: Diagnostic test results will improve Outcome: Completed/Met   Problem: Activity: Goal: Risk for activity intolerance will decrease Outcome: Completed/Met   Problem: Nutrition: Goal: Adequate nutrition will be maintained Outcome: Completed/Met   Problem: Coping: Goal: Level of anxiety will decrease Outcome: Completed/Met   Problem: Elimination: Goal: Will not experience complications related to bowel motility Outcome: Completed/Met Goal: Will not experience complications related to urinary retention Outcome: Completed/Met   Problem: Skin Integrity: Goal: Risk for impaired skin integrity will decrease Outcome: Completed/Met   Problem: Education: Goal: Knowledge of disease and its progression will improve Outcome: Completed/Met Goal: Individualized Educational Video(s) Outcome: Completed/Met   Problem: Fluid Volume: Goal: Compliance with measures to maintain balanced fluid volume will improve Outcome: Completed/Met   Problem: Health Behavior/Discharge Planning: Goal: Ability to manage health-related needs will improve Outcome: Completed/Met   Problem: Nutritional: Goal: Ability to make healthy dietary choices will improve Outcome: Completed/Met

## 2018-09-30 NOTE — Progress Notes (Addendum)
[facility] Progress Note   Subjective:   Patient seen and examined at bedside.  Reports improvement in LE weakness following dialysis, continues to have some weakness on L.  Swelling and pain in RUE starting last night.  Per patient to have [place] to evaluate.  Otherwise feeling ok.  Denies SOB, CP, LE edema, n/v/d, and fatigue. Has what looks like cellulitis of upper right arm-  Had IV, complication ?  Per primary  Objective Vitals:   09/29/18 1732 09/29/18 2018 09/30/18 0425 09/30/18 0926  BP: 140/64 99/81 135/60 (!) 138/41  Pulse: 62 61 60 64  Resp: 18 17 17 20   Temp:  98.5 F (36.9 C) 98 F (36.7 C) 98.4 F (36.9 C)  TempSrc:  Oral Oral Oral  SpO2: 99% 100% 98% 97%  Weight:       Physical Exam General:NAD, WNWD female, laying in bed Heart:RRR, no mrg Lungs:CTAB Abdomen:soft, NTND Extremities:no LE edema, RUE +swelling, +tenderness from shoulder to elbow Dialysis Access: LU AVG   Filed Weights   09/29/18 0110 09/29/18 0445  Weight: 82.4 kg 81.3 kg    Intake/Output Summary (Last 24 hours) at 09/30/2018 1032 Last data filed at 09/30/2018 0827 Gross per 24 hour  Intake 610.47 ml  Output 0 ml  Net 610.47 ml    Additional Objective Labs: Basic Metabolic Panel: Recent Labs  Lab 09/28/18 1843  09/29/18 0214 09/29/18 0438 09/30/18 0407  NA 134*   < > 136 139 131*  K >7.5*   < > 4.2 4.2 5.9*  CL 90*   < > 91* 95* 90*  CO2 24   < > 24 27 23   GLUCOSE 93   < > 89 58* 72  BUN 134*   < > 76* 42* 69*  CREATININE 14.07*   < > 9.09* 6.76* 10.59*  CALCIUM 10.8*   < > 9.4 9.5 8.5*  PHOS 7.2*  --   --  4.0  --    < > = values in this interval not displayed.   Liver Function Tests: Recent Labs  Lab 09/28/18 1843  AST 16  ALT 21  ALKPHOS 81  BILITOT 0.5  PROT 6.8  ALBUMIN 3.9   CBC: Recent Labs  Lab 09/28/18 1843 09/29/18 0438 09/30/18 0407  WBC 14.8* 8.5 7.6  NEUTROABS 12.4*  --  4.7  HGB 15.6* 13.5 12.8  HCT 47.0* 40.7 38.0  MCV 106.8*  103.0* 103.5*  PLT 308 214 173    Cardiac Enzymes: Recent Labs  Lab 09/28/18 2018  CKTOTAL 113   CBG: Recent Labs  Lab 09/29/18 1701 09/29/18 2018 09/30/18 0000 09/30/18 0426 09/30/18 0756  GLUCAP 97 108* 118* 85 94    Medications: . sodium chloride    . sodium chloride    . dextrose Stopped (09/29/18 1327)   . Chlorhexidine Gluconate Cloth  6 each Topical Q0600  . Chlorhexidine Gluconate Cloth  6 each Topical Q0600  . heparin  5,000 Units Subcutaneous Q8H  . sodium bicarbonate  50 mEq Intravenous Once    Dialysis Orders: AF - TTS 3hrs 45 min 400/800 EDW 76.5kg Profile 2 LU AVG Hep 4000 units qHD Hectorol 44mcg qHD  Assessment/Plan: 1. Hyperkalemia - d/t missed HD & fall, improved post HD then K^5.9 this AM.  Discussed low potassium diet. Had emergent HD on early AM Wed- Plan for HD today off schedule. 2. ESRD - on HD TTS.  Plan for HD today off schedule and again tomorrow to keep on schedule.  3. Anemia of CKD- Hgb 12.8, no indication for ESA. 4. Secondary hyperparathyroidism -  Ca at goal.  Checking phos.  Continue VDRA.  5. HTN/volume - BP in goal.  Does not appear grossly volume overloaded on exam, but if weights correct 5kg over EDW.  UF goal with HD 2-3L today and again tomorrow.   6. Nutrition - Renal diet w/ fluid restrictions.  7. Fall/Injury - CK only 113 8.  Cellulitis of right arm- per primary - consider abx  Jen Mow, PA-C [facility] Pager: [phone] 09/30/2018,10:32 AM  LOS: 2 days    Patient seen and examined, agree with above note with above modifications. Looks fine today-  Right upper arm erythematous - infection vs allergy to something- that is her main c/o.  Planning for HD today and tomorrow to get back on schedule  Corliss Parish, MD 09/30/2018

## 2018-09-30 NOTE — TOC Progression Note (Signed)
Transition of Care [facility]) - Progression Note    Patient Details  Name: Mary Wilcox MRN: [id number] Date of Birth: 03/15/1956  Transition of Care [facility]) CM/SW Contact  Bartholomew Crews, RN Phone Number: [phone] 09/30/2018, 1:59 PM  Clinical Narrative:    Spoke with patient at bedside about choice for [facility] agency. Referral placed to [facility] - accepted. MD notified of [place] orders needed prior to discharge.   Expected Discharge Plan: [place] Barriers to Discharge: Continued Medical Work up  Expected Discharge Plan and Services Expected Discharge Plan: [place] In-house Referral: NA Discharge Planning Services: CM Consult Post Acute Care Choice: [place] arrangements for the past 2 months: Apartment Expected Discharge Date: 09/30/18               DME Arranged: N/A DME Agency: NA       HH Arranged: PT HH Agency: [facility] Date [place] Agency Contacted: 09/30/18 Time [place]: [id number] Representative spoke with at [place]: [place] ([place]) Interventions    Readmission Risk Interventions No flowsheet data found.

## 2018-09-30 NOTE — Progress Notes (Signed)
Right upper extremity venous duplex has been completed. Preliminary results can be found in CV Proc through chart review.  Results were given to the patient's nurse, Lanelle Bal.  09/30/18 1:04 PM Mary Wilcox RVT

## 2018-09-30 NOTE — Progress Notes (Signed)
Patient ID: Mary Wilcox, female   DOB: Jul 13, 1955, 63 y.o.   MRN: [id number]  PROGRESS NOTE    Mary Wilcox  TDS:[id number] DOB: Dec 11, 1955 DOA: 09/28/2018 PCP: Kristen Loader, FNP   Brief Narrative:  63 year old female with history of end-stage renal disease, anemia, hyperparathyroidism, PVD who had missed her scheduled for dialysis presented on 09/28/2018 with weakness and was found to have potassium of more than 7.5 with bradycardia as low as 29.  Nephrology was consulted, patient underwent emergent hemodialysis.  She was admitted to ICU.  Subsequently she was transferred to [facility] service on 09/30/2018.  Assessment & Plan:   Hyperkalemia: Secondary to missed hemodialysis End-stage renal disease on hemodialysis Anion gap metabolic acidosis -Patient presented with a potassium of more than 7.5 and required emergent hemodialysis and admission to ICU.  Transferred to [facility] service on 09/30/2018. -Nephrology following and planning for hemodialysis again today as well.  Potassium 5.9 this morning.  Monitor. -Low potassium diet  Right upper extremity swelling with erythema -Questionable cause.  Will get right upper extremity duplex.  Will start Keflex orally.  Bradycardia -Most likely secondary to above.  Resolved.  Monitor  Hypoglycemia  -Blood sugars on the low side but stable.  Anemia of chronic disease -Hemoglobin stable.  Monitor  Hypertension -Blood pressure on the lower side.  Monitor  Generalized conditioning -PT eval.    DVT prophylaxis: Heparin Code Status: Full Family Communication: Spoke to patient at bedside Disposition Plan: Home in 1 to 2 days once cleared by nephrology  Consultants: Nephrology/PCCM  Procedures: None  Antimicrobials: None   Subjective: Patient seen and examined at bedside.  Complains of right upper arm swelling and pain with redness.  No overnight fever or vomiting.  Still feels weak but feels better.  No fever or vomiting.  Objective:  Vitals:   09/29/18 1732 09/29/18 2018 09/30/18 0425 09/30/18 0926  BP: 140/64 99/81 135/60 (!) 138/41  Pulse: 62 61 60 64  Resp: 18 17 17 20   Temp:  98.5 F (36.9 C) 98 F (36.7 C) 98.4 F (36.9 C)  TempSrc:  Oral Oral Oral  SpO2: 99% 100% 98% 97%  Weight:        Intake/Output Summary (Last 24 hours) at 09/30/2018 1133 Last data filed at 09/30/2018 0827 Gross per 24 hour  Intake 560.5 ml  Output 0 ml  Net 560.5 ml   Filed Weights   09/29/18 0110 09/29/18 0445  Weight: 82.4 kg 81.3 kg    Examination:  General exam: Appears calm and comfortable  Respiratory system: Bilateral decreased breath sounds at bases with some scattered crackles Cardiovascular system: S1 & S2 heard, Rate controlled Gastrointestinal system: Abdomen is nondistended, soft and nontender. Normal bowel sounds heard. Extremities: No cyanosis, clubbing; right upper extremity swelling with tenderness and mild erythema above the elbow Central nervous system: Alert and oriented. No focal neurological deficits. Moving extremities Skin: No other lesions or ulcers. Psychiatry: Judgement and insight appear normal. Mood & affect appropriate.     Data Reviewed: I have personally reviewed following labs and imaging studies  CBC: Recent Labs  Lab 09/28/18 1843 09/29/18 0438 09/30/18 0407  WBC 14.8* 8.5 7.6  NEUTROABS 12.4*  --  4.7  HGB 15.6* 13.5 12.8  HCT 47.0* 40.7 38.0  MCV 106.8* 103.0* 103.5*  PLT 308 214 262   Basic Metabolic Panel: Recent Labs  Lab 09/28/18 1843 09/28/18 2018 09/29/18 0214 09/29/18 0438 09/30/18 0407  NA 134* 133* 136 139 131*  K >  7.5* >7.5* 4.2 4.2 5.9*  CL 90* 91* 91* 95* 90*  CO2 24 19* 24 27 23   GLUCOSE 93 116* 89 58* 72  BUN 134* 134* 76* 42* 69*  CREATININE 14.07* 13.61* 9.09* 6.76* 10.59*  CALCIUM 10.8* 10.5* 9.4 9.5 8.5*  MG 3.9*  --   --  2.4 2.5*  PHOS 7.2*  --   --  4.0  --    GFR: CrCl cannot be calculated (Unknown ideal weight.). Liver Function Tests:  Recent Labs  Lab 09/28/18 1843  AST 16  ALT 21  ALKPHOS 81  BILITOT 0.5  PROT 6.8  ALBUMIN 3.9   No results for input(s): LIPASE, AMYLASE in the last 168 hours. No results for input(s): AMMONIA in the last 168 hours. Coagulation Profile: No results for input(s): INR, PROTIME in the last 168 hours. Cardiac Enzymes: Recent Labs  Lab 09/28/18 2018  CKTOTAL 113   BNP (last 3 results) No results for input(s): PROBNP in the last 8760 hours. HbA1C: No results for input(s): HGBA1C in the last 72 hours. CBG: Recent Labs  Lab 09/29/18 1701 09/29/18 2018 09/30/18 0000 09/30/18 0426 09/30/18 0756  GLUCAP 97 108* 118* 85 94   Lipid Profile: No results for input(s): CHOL, HDL, LDLCALC, TRIG, CHOLHDL, LDLDIRECT in the last 72 hours. Thyroid Function Tests: No results for input(s): TSH, T4TOTAL, FREET4, T3FREE, THYROIDAB in the last 72 hours. Anemia Panel: No results for input(s): VITAMINB12, FOLATE, FERRITIN, TIBC, IRON, RETICCTPCT in the last 72 hours. Sepsis Labs: No results for input(s): PROCALCITON, LATICACIDVEN in the last 168 hours.  Recent Results (from the past 240 hour(s))  SARS Coronavirus 2 (CEPHEID - Performed in [place] hospital lab), Hosp Order     Status: None   Collection Time: 09/28/18  8:35 PM   Specimen: Nasopharyngeal Swab  Result Value Ref Range Status   SARS Coronavirus 2 NEGATIVE NEGATIVE Final    Comment: (NOTE) If result is NEGATIVE SARS-CoV-2 target nucleic acids are NOT DETECTED. The SARS-CoV-2 RNA is generally detectable in upper and lower  respiratory specimens during the acute phase of infection. The lowest  concentration of SARS-CoV-2 viral copies this assay can detect is 250  copies / mL. A negative result does not preclude SARS-CoV-2 infection  and should not be used as the sole basis for treatment or other  patient management decisions.  A negative result may occur with  improper specimen collection / handling, submission of specimen  other  than nasopharyngeal swab, presence of viral mutation(s) within the  areas targeted by this assay, and inadequate number of viral copies  (<250 copies / mL). A negative result must be combined with clinical  observations, patient history, and epidemiological information. If result is POSITIVE SARS-CoV-2 target nucleic acids are DETECTED. The SARS-CoV-2 RNA is generally detectable in upper and lower  respiratory specimens dur ing the acute phase of infection.  Positive  results are indicative of active infection with SARS-CoV-2.  Clinical  correlation with patient history and other diagnostic information is  necessary to determine patient infection status.  Positive results do  not rule out bacterial infection or co-infection with other viruses. If result is PRESUMPTIVE POSTIVE SARS-CoV-2 nucleic acids MAY BE PRESENT.   A presumptive positive result was obtained on the submitted specimen  and confirmed on repeat testing.  While 2019 novel coronavirus  (SARS-CoV-2) nucleic acids may be present in the submitted sample  additional confirmatory testing may be necessary for epidemiological  and / or clinical management  purposes  to differentiate between  SARS-CoV-2 and other Sarbecovirus currently known to infect humans.  If clinically indicated additional testing with an alternate test  methodology [phone]) is advised. The SARS-CoV-2 RNA is generally  detectable in upper and lower respiratory sp ecimens during the acute  phase of infection. The expected result is Negative. Fact Sheet for Patients:  StrictlyIdeas.no Fact Sheet for Healthcare Providers: BankingDealers.co.za This test is not yet approved or cleared by the [place] FDA and has been authorized for detection and/or diagnosis of SARS-CoV-2 by FDA under an Emergency Use Authorization (EUA).  This EUA will remain in effect (meaning this test can be used) for the duration  of the COVID-19 declaration under Section 564(b)(1) of the Act, 21 U.S.C. section 360bbb-3(b)(1), unless the authorization is terminated or revoked sooner. Performed at [facility], [place] [address], [place], [place] [address]   MRSA PCR Screening     Status: None   Collection Time: 09/28/18 11:10 PM   Specimen: Nasopharyngeal  Result Value Ref Range Status   MRSA by PCR NEGATIVE NEGATIVE Final    Comment:        The GeneXpert MRSA Assay (FDA approved for NASAL specimens only), is one component of a comprehensive MRSA colonization surveillance program. It is not intended to diagnose MRSA infection nor to guide or monitor treatment for MRSA infections. Performed at [facility], [place] [address] [place], [place] [address]          Radiology Studies: No results found.      Scheduled Meds: . Chlorhexidine Gluconate Cloth  6 each Topical Q0600  . Chlorhexidine Gluconate Cloth  6 each Topical Q0600  . Chlorhexidine Gluconate Cloth  6 each Topical Q0600  . heparin  5,000 Units Subcutaneous Q8H  . sodium bicarbonate  50 mEq Intravenous Once   Continuous Infusions: . sodium chloride    . sodium chloride    . dextrose Stopped (09/29/18 1327)     LOS: 2 days        Aline August, MD [facility] 09/30/2018, 11:33 AM

## 2018-09-30 NOTE — Progress Notes (Signed)
Notified MD Alekh via secure chat- R arm [place] results: acute superficial thrombosis in the right basilic vein and right cephalic vein.   MD aware of k=5.9 HD is also aware.  Paulla Fore, RN

## 2018-10-01 DIAGNOSIS — I82890 Acute embolism and thrombosis of other specified veins: Secondary | ICD-10-CM

## 2018-10-01 LAB — CBC WITH DIFFERENTIAL/PLATELET
Abs Immature Granulocytes: 0.03 10*3/uL (ref 0.00–0.07)
Basophils Absolute: 0.1 10*3/uL (ref 0.0–0.1)
Basophils Relative: 1 %
Eosinophils Absolute: 0.2 10*3/uL (ref 0.0–0.5)
Eosinophils Relative: 2 %
HCT: 43.4 % (ref 36.0–46.0)
Hemoglobin: 14.7 g/dL (ref 12.0–15.0)
Immature Granulocytes: 0 %
Lymphocytes Relative: 13 %
Lymphs Abs: 1.1 10*3/uL (ref 0.7–4.0)
MCH: 35.6 pg — ABNORMAL HIGH (ref 26.0–34.0)
MCHC: 33.9 g/dL (ref 30.0–36.0)
MCV: 105.1 fL — ABNORMAL HIGH (ref 80.0–100.0)
Monocytes Absolute: 1.2 10*3/uL — ABNORMAL HIGH (ref 0.1–1.0)
Monocytes Relative: 14 %
Neutro Abs: 5.9 10*3/uL (ref 1.7–7.7)
Neutrophils Relative %: 70 %
Platelets: 184 10*3/uL (ref 150–400)
RBC: 4.13 MIL/uL (ref 3.87–5.11)
RDW: 13.9 % (ref 11.5–15.5)
WBC: 8.4 10*3/uL (ref 4.0–10.5)
nRBC: 0 % (ref 0.0–0.2)

## 2018-10-01 MED ORDER — PENTAFLUOROPROP-TETRAFLUOROETH EX AERO: 1.0000 "application " | INHALATION_SPRAY | CUTANEOUS | Status: DC | PRN

## 2018-10-01 MED ORDER — CEPHALEXIN 500 MG PO CAPS: 500.0000 mg | ORAL_CAPSULE | ORAL | 0 refills | Status: DC

## 2018-10-01 MED ORDER — HEPARIN SODIUM (PORCINE) 1000 UNIT/ML DIALYSIS: 1000.0000 [IU] | INTRAMUSCULAR | Status: DC | PRN

## 2018-10-01 MED ORDER — HEPARIN SODIUM (PORCINE) 1000 UNIT/ML IJ SOLN
INTRAMUSCULAR | Status: AC
  Filled 2018-10-01: qty 2

## 2018-10-01 MED ORDER — SODIUM CHLORIDE 0.9 % IV SOLN: 100.0000 mL | INTRAVENOUS | Status: DC | PRN

## 2018-10-01 MED ORDER — HEPARIN SODIUM (PORCINE) 1000 UNIT/ML DIALYSIS: 20.0000 [IU]/kg | INTRAMUSCULAR | Status: DC | PRN

## 2018-10-01 MED ORDER — LIDOCAINE HCL (PF) 1 % IJ SOLN: 5.0000 mL | INTRAMUSCULAR | Status: DC | PRN

## 2018-10-01 MED ORDER — CALCIUM ACETATE (PHOS BINDER) 667 MG PO CAPS: 667.0000 mg | ORAL_CAPSULE | Freq: Three times a day (TID) | ORAL | Status: DC

## 2018-10-01 MED ORDER — ALTEPLASE 2 MG IJ SOLR: 2.0000 mg | Freq: Once | INTRAMUSCULAR | Status: DC | PRN

## 2018-10-01 MED ORDER — FERRIC CITRATE 1 GM 210 MG(FE) PO TABS: 420.0000 mg | ORAL_TABLET | Freq: Three times a day (TID) | ORAL | Status: DC

## 2018-10-01 MED ORDER — LIDOCAINE-PRILOCAINE 2.5-2.5 % EX CREA: 1.0000 "application " | TOPICAL_CREAM | CUTANEOUS | Status: DC | PRN

## 2018-10-01 MED ORDER — ACETAMINOPHEN 325 MG PO TABS
ORAL_TABLET | ORAL | Status: AC
  Administered 2018-10-01: 650 mg via ORAL
  Filled 2018-10-01: qty 2

## 2018-10-01 NOTE — Progress Notes (Signed)
PT Cancellation Note  Patient Details Name: TEKEYA GEFFERT MRN: [id number] DOB: 10-Jun-1955   Cancelled Treatment:    Reason Eval/Treat Not Completed: Patient at procedure or test/unavailable Pt currently at HD. Will follow up as schedule allows.   Leighton Ruff, PT, DPT  Acute Rehabilitation Services  Pager: [phone] Office: [phone]    Rudean Hitt 10/01/2018, 8:52 AM

## 2018-10-01 NOTE — Care Management (Signed)
Notified [facility] that patient will DC today. No other CM needs idenified

## 2018-10-01 NOTE — Progress Notes (Signed)
[facility] Progress Note   Subjective:   HD yest - removed 2000 and today to get back on schedule - found to have superficial thrombus in right arm but no anticoagulation needed-  Arm still looking red.  She prefers to wait until tomorrow for discharge- will leave to primary.  Seen on HD    Objective Vitals:   10/01/18 0446 10/01/18 0657 10/01/18 0704 10/01/18 0736  BP: 97/66 110/66 118/62 116/65  Pulse: 68 61 (!) 59 60  Resp: 18 18 18 18   Temp: 98.2 F (36.8 C) 98.6 F (37 C)    TempSrc: Oral Oral    SpO2: 98%     Weight:  79.3 kg     Physical Exam General:NAD, WNWD female, laying in bed Heart:RRR, no mrg Lungs:CTAB Abdomen:soft, NTND Extremities:no LE edema, RUE +swelling, +tenderness from shoulder to elbow Dialysis Access: LU AVG   Filed Weights   09/30/18 1411 09/30/18 1756 10/01/18 0657  Weight: 80.5 kg 78.5 kg 79.3 kg    Intake/Output Summary (Last 24 hours) at 10/01/2018 0818 Last data filed at 10/01/2018 0600 Gross per 24 hour  Intake 560 ml  Output 2000 ml  Net -1440 ml    Additional Objective Labs: Basic Metabolic Panel: Recent Labs  Lab 09/28/18 1843  09/29/18 0214 09/29/18 0438 09/30/18 0407 09/30/18 1345  NA 134*   < > 136 139 131*  --   K >7.5*   < > 4.2 4.2 5.9*  --   CL 90*   < > 91* 95* 90*  --   CO2 24   < > 24 27 23   --   GLUCOSE 93   < > 89 58* 72  --   BUN 134*   < > 76* 42* 69*  --   CREATININE 14.07*   < > 9.09* 6.76* 10.59*  --   CALCIUM 10.8*   < > 9.4 9.5 8.5*  --   PHOS 7.2*  --   --  4.0  --  7.1*   < > = values in this interval not displayed.   Liver Function Tests: Recent Labs  Lab 09/28/18 1843  AST 16  ALT 21  ALKPHOS 81  BILITOT 0.5  PROT 6.8  ALBUMIN 3.9   CBC: Recent Labs  Lab 09/28/18 1843 09/29/18 0438 09/30/18 0407  WBC 14.8* 8.5 7.6  NEUTROABS 12.4*  --  4.7  HGB 15.6* 13.5 12.8  HCT 47.0* 40.7 38.0  MCV 106.8* 103.0* 103.5*  PLT 308 214 173    Cardiac Enzymes: Recent Labs   Lab 09/28/18 2018  CKTOTAL 113   CBG: Recent Labs  Lab 09/30/18 0000 09/30/18 0426 09/30/18 0756 09/30/18 1200 09/30/18 1838  GLUCAP 118* 85 94 85 73    Medications: . sodium chloride    . sodium chloride    . sodium chloride    . sodium chloride     . heparin      . cephALEXin  500 mg Oral Q24H  . heparin  5,000 Units Subcutaneous Q8H  . sodium bicarbonate  50 mEq Intravenous Once    Dialysis Orders: AF - TTS 3hrs 45 min 400/800 EDW 76.5kg Profile 2 LU AVG Hep 4000 units qHD Hectorol 54mcg qHD  Assessment/Plan: 1. Hyperkalemia - d/t missed HD & fall, improved post HD then K^5.9 this AM.  Discussed low potassium diet. Had emergent HD on early AM Wed- then HD Fri and Sat 2. ESRD - on HD TTS.  Plan for HD  today to keep on schedule.  3. Anemia of CKD- Hgb 12.8, no indication for ESA. 4. Secondary hyperparathyroidism -  Ca at goal.   Phos- high - give binders- takes combo of auryxia an phoslo.  Continue VDRA.  5. HTN/volume - BP in goal.  Does not appear grossly volume overloaded on exam, should get to EDW today 6. Nutrition - Renal diet w/ fluid restrictions.  7. Fall/Injury - CK only 113 8.  Cellulitis of right arm- per primary - keflex   Corliss Parish, MD 10/01/2018

## 2018-10-01 NOTE — Discharge Summary (Addendum)
Physician Discharge Summary  Mary Wilcox LPF:[id number] DOB: 09-07-1955 DOA: 09/28/2018  PCP: Kristen Loader, FNP  Admit date: 09/28/2018 Discharge date: 10/01/2018  Admitted From: Home Disposition: Home  Recommendations for Outpatient Follow-up:  1. Follow up with PCP in 1 week  2. Outpatient follow-up with dialysis as scheduled 3. Follow up in ED if symptoms worsen or new appear   Home Health: Home health PT Equipment/Devices: None  Discharge Condition: Stable CODE STATUS: Full Diet recommendation: Heart healthy/renal hemodialysis diet with fluid restriction of up to 1200 cc a day  Brief/Interim Summary: 63 year old female with history of end-stage renal disease, anemia, hyperparathyroidism, PVD who had missed her scheduled for dialysis presented on 09/28/2018 with weakness and was found to have potassium of more than 7.5 with bradycardia as low as 29.  Nephrology was consulted, patient underwent emergent hemodialysis.  She was admitted to ICU.  Subsequently she was transferred to [facility] service on 09/30/2018.Marland Kitchen  Her condition is improved.  She was found to have right upper extremity superficial vein thrombosis with some overlying cellulitis.  She was started on oral Keflex.  Nephrology has cleared the patient for discharge.  She will need home health physical therapy.  She will be discharged home on Keflex.  Discharge Diagnoses:   Hyperkalemia: Secondary to missed hemodialysis End-stage renal disease on hemodialysis Anion gap metabolic acidosis -Patient presented with a potassium of more than 7.5 and required emergent hemodialysis and admission to ICU.  Transferred to [facility] service on 09/30/2018. -Nephrology following.  Patient is getting dialysis again today as well.  Nephrology has cleared the patient for discharge. -Low potassium diet  -Outpatient follow-up with dialysis unit as scheduled.  Patient should not miss her dialysis schedule   Right upper extremity superficial vein  thrombosis causing swelling erythema with mild cellulitis -Currently on Keflex.  Right upper extremity still swollen and erythematous but improving.  Right upper quadrant ultrasound showed superficial vein thrombosis of right basilic and cephalic veins.  She will benefit from limb elevation and warm/cold compresses. -Continue Keflex for 5 more days on discharge.  Bradycardia -Most likely secondary to above.  Resolved.    Hypoglycemia  -Blood sugars on the low side but stable.  Outpatient follow-up  Anemia of chronic disease -Hemoglobin stable.    Outpatient follow-up  Hypertension -Blood pressure on the lower side.    Outpatient follow-up  Generalized conditioning -PT recommends home health PT.  Discharge Instructions  Discharge Instructions    Diet - low sodium heart healthy   Complete by: As directed    Increase activity slowly   Complete by: As directed      Allergies as of 10/01/2018      Reactions   Prozac [fluoxetine Hcl] Hives      Medication List    STOP taking these medications   predniSONE 10 MG (21) Tbpk tablet Commonly known as: STERAPRED UNI-PAK 21 TAB     TAKE these medications   acetaminophen 500 MG tablet Commonly known as: TYLENOL Take 1,000 mg by mouth every 6 (six) hours as needed for headache (pain).   Auryxia 1 GM 210 MG(Fe) tablet Generic drug: ferric citrate Take 210-420 mg by mouth See admin instructions. Take two tablets (420 mg) by mouth three times daily with meals and one tablet (210 mg) up to 3 times daily with snacks   calcium acetate 667 MG capsule Commonly known as: PHOSLO Take 667 mg by mouth See admin instructions. Take one capsule (667 mg) by mouth three times  daily with meals and 2-3 times with snacks.   cephALEXin 500 MG capsule Commonly known as: KEFLEX Take 1 capsule (500 mg total) by mouth daily.   cinacalcet 90 MG tablet Commonly known as: SENSIPAR Take 90 mg by mouth at bedtime.   gabapentin 100 MG  capsule Commonly known as: NEURONTIN Take 100 mg by mouth at bedtime. For leg pain   levothyroxine 25 MCG tablet Commonly known as: SYNTHROID Take 25 mcg by mouth daily before breakfast.   midodrine 10 MG tablet Commonly known as: PROAMATINE Take 10 mg by mouth See admin instructions. Take one tablet (10 mg) by mouth twice on dialysis days (Tuesday, Thursday, Saturday) at 4am and 8am   sertraline 100 MG tablet Commonly known as: ZOLOFT Take 100 mg by mouth daily.   SUPER B COMPLEX PO Take 1 tablet by mouth daily.   vitamin B-12 1000 MCG tablet Commonly known as: CYANOCOBALAMIN Take 1,000 mcg by mouth daily.   zolpidem 10 MG tablet Commonly known as: AMBIEN Take 10 mg by mouth at bedtime as needed for sleep.       Follow-up Information    [facility].   Specialty: [place] Why: someone will call to schedule a visit for physical therapy Contact information: [address] [place] [address] [phone]        Kristen Loader, FNP. Schedule an appointment as soon as possible for a visit in 1 week(s).   Specialty: Family Medicine Contact information: [place] [address] [phone]          Allergies  Allergen Reactions  . Prozac [Fluoxetine Hcl] Hives    Consultations:  Nephrology/PCCM   Procedures/Studies: Vas [place] Upper Extremity Venous Duplex  Result Date: 09/30/2018 UPPER VENOUS STUDY  Indications: Pain, Swelling, and Erythema Risk Factors: None identified. Comparison Study: No prior studies. Performing Technologist: Oliver Hum RVT  Examination Guidelines: A complete evaluation includes B-mode imaging, spectral Doppler, color Doppler, and power Doppler as needed of all accessible portions of each vessel. Bilateral testing is considered an integral part of a complete examination. Limited examinations for reoccurring indications may be performed as noted.  Right Findings:  +----------+------------+---------+-----------+----------+-------+ RIGHT     CompressiblePhasicitySpontaneousPropertiesSummary +----------+------------+---------+-----------+----------+-------+ IJV           Full       Yes       Yes                      +----------+------------+---------+-----------+----------+-------+ Subclavian    Full       Yes       Yes                      +----------+------------+---------+-----------+----------+-------+ Axillary      Full       Yes       Yes                      +----------+------------+---------+-----------+----------+-------+ Brachial      Full       Yes       Yes                      +----------+------------+---------+-----------+----------+-------+ Radial        Full                                          +----------+------------+---------+-----------+----------+-------+  Ulnar         Full                                          +----------+------------+---------+-----------+----------+-------+ Cephalic      None                                   Acute  +----------+------------+---------+-----------+----------+-------+ Basilic       None                                   Acute  +----------+------------+---------+-----------+----------+-------+  Left Findings: +----------+------------+---------+-----------+----------+-------+ LEFT      CompressiblePhasicitySpontaneousPropertiesSummary +----------+------------+---------+-----------+----------+-------+ Subclavian    Full       Yes       Yes                      +----------+------------+---------+-----------+----------+-------+  Summary:  Right: No evidence of deep vein thrombosis in the upper extremity. Findings consistent with acute superficial vein thrombosis involving the right basilic vein and right cephalic vein.  Left: No evidence of thrombosis in the subclavian.  *See table(s) above for measurements and observations.  Diagnosing physician:  Monica Martinez MD Electronically signed by Monica Martinez MD on 09/30/2018 at 1:40:01 PM.    Final        Subjective: Seen and examined at bedside.  Feels weak..  No overnight fever or vomiting.  Still complains of right upper extremity swelling but improving.  Discharge Exam: Vitals:   10/01/18 0944 10/01/18 1013  BP: (!) 89/46 (!) (P) 84/50  Pulse: 72 (P) 77  Resp: 18 (P) 18  Temp:    SpO2:      General: Pt is alert, awake, not in acute distress Cardiovascular: rate controlled, S1/S2 + Respiratory: bilateral decreased breath sounds at bases Abdominal: Soft, NT, ND, bowel sounds + Extremities: right upper extremity swelling, improving with tenderness and mild erythema above the elbow    The results of significant diagnostics from this hospitalization (including imaging, microbiology, ancillary and laboratory) are listed below for reference.     Microbiology: Recent Results (from the past 240 hour(s))  SARS Coronavirus 2 (CEPHEID - Performed in [place] hospital lab), Hosp Order     Status: None   Collection Time: 09/28/18  8:35 PM   Specimen: Nasopharyngeal Swab  Result Value Ref Range Status   SARS Coronavirus 2 NEGATIVE NEGATIVE Final    Comment: (NOTE) If result is NEGATIVE SARS-CoV-2 target nucleic acids are NOT DETECTED. The SARS-CoV-2 RNA is generally detectable in upper and lower  respiratory specimens during the acute phase of infection. The lowest  concentration of SARS-CoV-2 viral copies this assay can detect is 250  copies / mL. A negative result does not preclude SARS-CoV-2 infection  and should not be used as the sole basis for treatment or other  patient management decisions.  A negative result may occur with  improper specimen collection / handling, submission of specimen other  than nasopharyngeal swab, presence of viral mutation(s) within the  areas targeted by this assay, and inadequate number of viral copies  (<250 copies / mL). A negative  result must be combined with clinical  observations, patient history, and epidemiological information. If result is POSITIVE SARS-CoV-2  target nucleic acids are DETECTED. The SARS-CoV-2 RNA is generally detectable in upper and lower  respiratory specimens dur ing the acute phase of infection.  Positive  results are indicative of active infection with SARS-CoV-2.  Clinical  correlation with patient history and other diagnostic information is  necessary to determine patient infection status.  Positive results do  not rule out bacterial infection or co-infection with other viruses. If result is PRESUMPTIVE POSTIVE SARS-CoV-2 nucleic acids MAY BE PRESENT.   A presumptive positive result was obtained on the submitted specimen  and confirmed on repeat testing.  While 2019 novel coronavirus  (SARS-CoV-2) nucleic acids may be present in the submitted sample  additional confirmatory testing may be necessary for epidemiological  and / or clinical management purposes  to differentiate between  SARS-CoV-2 and other Sarbecovirus currently known to infect humans.  If clinically indicated additional testing with an alternate test  methodology [phone]) is advised. The SARS-CoV-2 RNA is generally  detectable in upper and lower respiratory sp ecimens during the acute  phase of infection. The expected result is Negative. Fact Sheet for Patients:  StrictlyIdeas.no Fact Sheet for Healthcare Providers: BankingDealers.co.za This test is not yet approved or cleared by the [place] FDA and has been authorized for detection and/or diagnosis of SARS-CoV-2 by FDA under an Emergency Use Authorization (EUA).  This EUA will remain in effect (meaning this test can be used) for the duration of the COVID-19 declaration under Section 564(b)(1) of the Act, 21 U.S.C. section 360bbb-3(b)(1), unless the authorization is terminated or revoked sooner. Performed at [facility], [place] [address], [place], [place] [address]   MRSA PCR Screening     Status: None   Collection Time: 09/28/18 11:10 PM   Specimen: Nasopharyngeal  Result Value Ref Range Status   MRSA by PCR NEGATIVE NEGATIVE Final    Comment:        The GeneXpert MRSA Assay (FDA approved for NASAL specimens only), is one component of a comprehensive MRSA colonization surveillance program. It is not intended to diagnose MRSA infection nor to guide or monitor treatment for MRSA infections. Performed at [facility], [place] [address], [place], [place] [address]      Labs: BNP (last 3 results) No results for input(s): BNP in the last 8760 hours. Basic Metabolic Panel: Recent Labs  Lab 09/28/18 1843 09/28/18 2018 09/29/18 0214 09/29/18 0438 09/30/18 0407 09/30/18 1345  NA 134* 133* 136 139 131*  --   K >7.5* >7.5* 4.2 4.2 5.9*  --   CL 90* 91* 91* 95* 90*  --   CO2 24 19* 24 27 23   --   GLUCOSE 93 116* 89 58* 72  --   BUN 134* 134* 76* 42* 69*  --   CREATININE 14.07* 13.61* 9.09* 6.76* 10.59*  --   CALCIUM 10.8* 10.5* 9.4 9.5 8.5*  --   MG 3.9*  --   --  2.4 2.5*  --   PHOS 7.2*  --   --  4.0  --  7.1*   Liver Function Tests: Recent Labs  Lab 09/28/18 1843  AST 16  ALT 21  ALKPHOS 81  BILITOT 0.5  PROT 6.8  ALBUMIN 3.9   No results for input(s): LIPASE, AMYLASE in the last 168 hours. No results for input(s): AMMONIA in the last 168 hours. CBC: Recent Labs  Lab 09/28/18 1843 09/29/18 0438 09/30/18 0407  WBC 14.8* 8.5 7.6  NEUTROABS 12.4*  --  4.7  HGB 15.6* 13.5 12.8  HCT 47.0* 40.7 38.0  MCV 106.8* 103.0* 103.5*  PLT 308 214 173   Cardiac Enzymes: Recent Labs  Lab 09/28/18 2018  CKTOTAL 113   BNP: Invalid input(s): POCBNP CBG: Recent Labs  Lab 09/30/18 0000 09/30/18 0426 09/30/18 0756 09/30/18 1200 09/30/18 1838  GLUCAP 118* 85 94 85 73   D-Dimer No results for input(s): DDIMER in the last 72 hours. Hgb A1c No results for  input(s): HGBA1C in the last 72 hours. Lipid Profile No results for input(s): CHOL, HDL, LDLCALC, TRIG, CHOLHDL, LDLDIRECT in the last 72 hours. Thyroid function studies No results for input(s): TSH, T4TOTAL, T3FREE, THYROIDAB in the last 72 hours.  Invalid input(s): FREET3 Anemia work up No results for input(s): VITAMINB12, FOLATE, FERRITIN, TIBC, IRON, RETICCTPCT in the last 72 hours. Urinalysis    Component Value Date/Time   COLORURINE YELLOW 04/12/2011 0335   APPEARANCEUR TURBID (A) 04/12/2011 0335   LABSPEC 1.008 04/12/2011 0335   PHURINE 8.5 (H) 04/12/2011 0335   GLUCOSEU NEGATIVE 04/12/2011 0335   HGBUR LARGE (A) 04/12/2011 0335   BILIRUBINUR NEGATIVE 04/12/2011 0335   KETONESUR NEGATIVE 04/12/2011 0335   PROTEINUR 100 (A) 04/12/2011 0335   UROBILINOGEN 0.2 04/12/2011 0335   NITRITE NEGATIVE 04/12/2011 0335   LEUKOCYTESUR LARGE (A) 04/12/2011 0335   Sepsis Labs Invalid input(s): PROCALCITONIN,  WBC,  LACTICIDVEN Microbiology Recent Results (from the past 240 hour(s))  SARS Coronavirus 2 (CEPHEID - Performed in [place] hospital lab), Hosp Order     Status: None   Collection Time: 09/28/18  8:35 PM   Specimen: Nasopharyngeal Swab  Result Value Ref Range Status   SARS Coronavirus 2 NEGATIVE NEGATIVE Final    Comment: (NOTE) If result is NEGATIVE SARS-CoV-2 target nucleic acids are NOT DETECTED. The SARS-CoV-2 RNA is generally detectable in upper and lower  respiratory specimens during the acute phase of infection. The lowest  concentration of SARS-CoV-2 viral copies this assay can detect is 250  copies / mL. A negative result does not preclude SARS-CoV-2 infection  and should not be used as the sole basis for treatment or other  patient management decisions.  A negative result may occur with  improper specimen collection / handling, submission of specimen other  than nasopharyngeal swab, presence of viral mutation(s) within the  areas targeted by this assay, and  inadequate number of viral copies  (<250 copies / mL). A negative result must be combined with clinical  observations, patient history, and epidemiological information. If result is POSITIVE SARS-CoV-2 target nucleic acids are DETECTED. The SARS-CoV-2 RNA is generally detectable in upper and lower  respiratory specimens dur ing the acute phase of infection.  Positive  results are indicative of active infection with SARS-CoV-2.  Clinical  correlation with patient history and other diagnostic information is  necessary to determine patient infection status.  Positive results do  not rule out bacterial infection or co-infection with other viruses. If result is PRESUMPTIVE POSTIVE SARS-CoV-2 nucleic acids MAY BE PRESENT.   A presumptive positive result was obtained on the submitted specimen  and confirmed on repeat testing.  While 2019 novel coronavirus  (SARS-CoV-2) nucleic acids may be present in the submitted sample  additional confirmatory testing may be necessary for epidemiological  and / or clinical management purposes  to differentiate between  SARS-CoV-2 and other Sarbecovirus currently known to infect humans.  If clinically indicated additional testing with an alternate test  methodology [phone]) is advised. The SARS-CoV-2 RNA is generally  detectable in upper and lower respiratory sp ecimens during the acute  phase of infection. The expected result is Negative. Fact Sheet for Patients:  StrictlyIdeas.no Fact Sheet for Healthcare Providers: BankingDealers.co.za This test is not yet approved or cleared by the [place] FDA and has been authorized for detection and/or diagnosis of SARS-CoV-2 by FDA under an Emergency Use Authorization (EUA).  This EUA will remain in effect (meaning this test can be used) for the duration of the COVID-19 declaration under Section 564(b)(1) of the Act, 21 U.S.C. section 360bbb-3(b)(1), unless the  authorization is terminated or revoked sooner. Performed at [facility], [place] [address], [place], [place] [address]   MRSA PCR Screening     Status: None   Collection Time: 09/28/18 11:10 PM   Specimen: Nasopharyngeal  Result Value Ref Range Status   MRSA by PCR NEGATIVE NEGATIVE Final    Comment:        The GeneXpert MRSA Assay (FDA approved for NASAL specimens only), is one component of a comprehensive MRSA colonization surveillance program. It is not intended to diagnose MRSA infection nor to guide or monitor treatment for MRSA infections. Performed at [facility], [place] [address] [place], [place] [address]      Time coordinating discharge: 35 minutes  SIGNED:   Aline August, MD  [facility] 10/01/2018, 10:56 AM

## 2018-10-01 NOTE — Progress Notes (Signed)
Physical Therapy Treatment Patient Details Name: Mary Wilcox MRN: [id number] DOB: [birth year] Today's Date: 10/01/2018    History of Present Illness Pt adm with hyperkalemia after missed HD. Pt had fall at home. PMH - esrd on hd, PVD    PT Comments    Pt progressing towards goals. Limited gait tolerance this session, as pt had just gotten back from HD. Pt practiced short distance gait with and without rollator this session. Pt with mild unsteadiness without use, but no overt LOB noted. Educated about benefits of use of rollator, however, pt reports she does not think she needs at this time. Current recommendations appropriate. Will continue to follow acutely to maximize functional mobility independence and safety.    Follow Up Recommendations  Home health PT     Equipment Recommendations  None recommended by PT(pt refusing rollator )    Recommendations for Other Services OT consult     Precautions / Restrictions Precautions Precautions: Fall Restrictions Weight Bearing Restrictions: No    Mobility  Bed Mobility Overal bed mobility: Modified Independent                Transfers Overall transfer level: Needs assistance Equipment used: 4-wheeled walker;None Transfers: Sit to/from Stand Sit to Stand: Supervision         General transfer comment: Verbal cues to lock brakes when standing with rollator. Demonstrated safe hand placement.   Ambulation/Gait Ambulation/Gait assistance: Supervision Gait Distance (Feet): 50 Feet Assistive device: 4-wheeled walker;None Gait Pattern/deviations: Step-through pattern;Decreased stride length Gait velocity: decr   General Gait Details: Pt ambulated short distance in hall without rollator and short distance with rollator. Mild unsteadiness noted without use of rollator, however, no LOB noted. Pt also just getting back from HD and reports she is normally mildly unsteady.    Stairs             Wheelchair Mobility     Modified Rankin (Stroke Patients Only)       Balance Overall balance assessment: Needs assistance Sitting-balance support: No upper extremity supported;Feet supported Sitting balance-Leahy Scale: Good     Standing balance support: No upper extremity supported;During functional activity Standing balance-Leahy Scale: Fair                              Cognition Arousal/Alertness: Awake/alert Behavior During Therapy: WFL for tasks assessed/performed Overall Cognitive Status: Within Functional Limits for tasks assessed                                        Exercises      General Comments General comments (skin integrity, edema, etc.): Had lengthy discussion about using rollator vs. not using rollator. Pt reports she prefers not to use rollator at this time.       Pertinent Vitals/Pain Pain Assessment: No/denies pain    Home Living                      Prior Function            PT Goals (current goals can now be found in the care plan section) Acute Rehab PT Goals Patient Stated Goal: return home PT Goal Formulation: With patient Time For Goal Achievement: 10/13/18 Potential to Achieve Goals: Good Progress towards PT goals: Progressing toward goals    Frequency    Min 3X/week  PT Plan Current plan remains appropriate    Co-evaluation              AM-PAC PT "6 Clicks" Mobility   Outcome Measure  Help needed turning from your back to your side while in a flat bed without using bedrails?: None Help needed moving from lying on your back to sitting on the side of a flat bed without using bedrails?: None Help needed moving to and from a bed to a chair (including a wheelchair)?: None Help needed standing up from a chair using your arms (e.g., wheelchair or bedside chair)?: None Help needed to walk in hospital room?: A Little Help needed climbing 3-5 steps with a railing? : A Little 6 Click Score: 22    End of  Session   Activity Tolerance: Patient limited by fatigue Patient left: in bed;with call bell/phone within reach Nurse Communication: Mobility status PT Visit Diagnosis: Unsteadiness on feet (R26.81);Muscle weakness (generalized) (M62.81)     Time: 9622-2979 PT Time Calculation (min) (ACUTE ONLY): 15 min  Charges:  $Gait Training: 8-22 mins                     Leighton Ruff, PT, DPT  Acute Rehabilitation Services  Pager: [phone] Office: [phone]    Rudean Hitt 10/01/2018, 2:47 PM

## 2018-10-01 NOTE — Progress Notes (Signed)
DISCHARGE NOTE  [name] to be discharged Home per MD order. Patient verbalized understanding.  Skin clean, dry and intact without evidence of skin break down, no evidence of skin tears noted. IV catheter discontinued intact. Site without signs and symptoms of complications. Dressing and pressure applied. Pt denies pain at the site currently. No complaints noted.  Patient free of lines, drains, and wounds.   Discharge packet assembled. An After Visit Summary (AVS) was printed and given to patient. Patient escorted via wheelchair and discharged to home via private auto.    Babs Sciara, RN

## 2018-10-01 NOTE — Procedures (Signed)
Patient was seen on dialysis and the procedure was supervised.  BFR 400  Via AVG BP is  117/63.   Patient appears to be tolerating treatment well  Louis Meckel 10/01/2018

## 2018-10-02 LAB — BASIC METABOLIC PANEL
Anion gap: 15 (ref 5–15)
BUN: 14 mg/dL (ref 8–23)
CO2: 23 mmol/L (ref 22–32)
Calcium: 9.2 mg/dL (ref 8.9–10.3)
Chloride: 95 mmol/L — ABNORMAL LOW (ref 98–111)
Creatinine, Ser: 4.12 mg/dL — ABNORMAL HIGH (ref 0.44–1.00)
GFR calc Af Amer: 12 mL/min — ABNORMAL LOW (ref >60)
GFR calc non Af Amer: 11 mL/min — ABNORMAL LOW (ref >60)
Glucose, Bld: 108 mg/dL — ABNORMAL HIGH (ref 70–99)
Potassium: 4.6 mmol/L (ref 3.5–5.1)
Sodium: 133 mmol/L — ABNORMAL LOW (ref 135–145)

## 2018-10-04 DIAGNOSIS — N186 End stage renal disease: Secondary | ICD-10-CM | POA: Diagnosis not present

## 2018-10-04 DIAGNOSIS — N2581 Secondary hyperparathyroidism of renal origin: Secondary | ICD-10-CM | POA: Diagnosis not present

## 2018-10-06 DIAGNOSIS — E039 Hypothyroidism, unspecified: Secondary | ICD-10-CM | POA: Diagnosis not present

## 2018-10-06 DIAGNOSIS — N186 End stage renal disease: Secondary | ICD-10-CM | POA: Diagnosis not present

## 2018-10-06 DIAGNOSIS — N2581 Secondary hyperparathyroidism of renal origin: Secondary | ICD-10-CM | POA: Diagnosis not present

## 2018-10-08 DIAGNOSIS — N2581 Secondary hyperparathyroidism of renal origin: Secondary | ICD-10-CM | POA: Diagnosis not present

## 2018-10-08 DIAGNOSIS — N186 End stage renal disease: Secondary | ICD-10-CM | POA: Diagnosis not present

## 2018-10-10 DIAGNOSIS — F324 Major depressive disorder, single episode, in partial remission: Secondary | ICD-10-CM | POA: Diagnosis not present

## 2018-10-10 DIAGNOSIS — N186 End stage renal disease: Secondary | ICD-10-CM | POA: Diagnosis not present

## 2018-10-10 DIAGNOSIS — F5101 Primary insomnia: Secondary | ICD-10-CM | POA: Diagnosis not present

## 2018-10-10 DIAGNOSIS — Z992 Dependence on renal dialysis: Secondary | ICD-10-CM | POA: Diagnosis not present

## 2018-10-10 DIAGNOSIS — E039 Hypothyroidism, unspecified: Secondary | ICD-10-CM | POA: Diagnosis not present

## 2018-10-11 DIAGNOSIS — N2581 Secondary hyperparathyroidism of renal origin: Secondary | ICD-10-CM | POA: Diagnosis not present

## 2018-10-11 DIAGNOSIS — E039 Hypothyroidism, unspecified: Secondary | ICD-10-CM | POA: Diagnosis not present

## 2018-10-11 DIAGNOSIS — N186 End stage renal disease: Secondary | ICD-10-CM | POA: Diagnosis not present

## 2018-10-13 DIAGNOSIS — N186 End stage renal disease: Secondary | ICD-10-CM | POA: Diagnosis not present

## 2018-10-13 DIAGNOSIS — N2581 Secondary hyperparathyroidism of renal origin: Secondary | ICD-10-CM | POA: Diagnosis not present

## 2018-10-15 DIAGNOSIS — Z992 Dependence on renal dialysis: Secondary | ICD-10-CM | POA: Diagnosis not present

## 2018-10-15 DIAGNOSIS — N2581 Secondary hyperparathyroidism of renal origin: Secondary | ICD-10-CM | POA: Diagnosis not present

## 2018-10-15 DIAGNOSIS — N139 Obstructive and reflux uropathy, unspecified: Secondary | ICD-10-CM | POA: Diagnosis not present

## 2018-10-15 DIAGNOSIS — N186 End stage renal disease: Secondary | ICD-10-CM | POA: Diagnosis not present

## 2018-10-18 DIAGNOSIS — Z992 Dependence on renal dialysis: Secondary | ICD-10-CM | POA: Diagnosis not present

## 2018-10-18 DIAGNOSIS — N2581 Secondary hyperparathyroidism of renal origin: Secondary | ICD-10-CM | POA: Diagnosis not present

## 2018-10-18 DIAGNOSIS — N186 End stage renal disease: Secondary | ICD-10-CM | POA: Diagnosis not present

## 2018-10-21 DIAGNOSIS — T82868A Thrombosis of vascular prosthetic devices, implants and grafts, initial encounter: Secondary | ICD-10-CM | POA: Diagnosis not present

## 2018-10-21 DIAGNOSIS — Z992 Dependence on renal dialysis: Secondary | ICD-10-CM | POA: Diagnosis not present

## 2018-10-21 DIAGNOSIS — I871 Compression of vein: Secondary | ICD-10-CM | POA: Diagnosis not present

## 2018-10-21 DIAGNOSIS — N186 End stage renal disease: Secondary | ICD-10-CM | POA: Diagnosis not present

## 2018-10-21 DIAGNOSIS — N2581 Secondary hyperparathyroidism of renal origin: Secondary | ICD-10-CM | POA: Diagnosis not present

## 2018-10-22 DIAGNOSIS — Z992 Dependence on renal dialysis: Secondary | ICD-10-CM | POA: Diagnosis not present

## 2018-10-22 DIAGNOSIS — N2581 Secondary hyperparathyroidism of renal origin: Secondary | ICD-10-CM | POA: Diagnosis not present

## 2018-10-22 DIAGNOSIS — N186 End stage renal disease: Secondary | ICD-10-CM | POA: Diagnosis not present

## 2018-10-25 DIAGNOSIS — N186 End stage renal disease: Secondary | ICD-10-CM | POA: Diagnosis not present

## 2018-10-25 DIAGNOSIS — Z992 Dependence on renal dialysis: Secondary | ICD-10-CM | POA: Diagnosis not present

## 2018-10-25 DIAGNOSIS — N2581 Secondary hyperparathyroidism of renal origin: Secondary | ICD-10-CM | POA: Diagnosis not present

## 2018-10-27 DIAGNOSIS — N2581 Secondary hyperparathyroidism of renal origin: Secondary | ICD-10-CM | POA: Diagnosis not present

## 2018-10-27 DIAGNOSIS — Z992 Dependence on renal dialysis: Secondary | ICD-10-CM | POA: Diagnosis not present

## 2018-10-27 DIAGNOSIS — N186 End stage renal disease: Secondary | ICD-10-CM | POA: Diagnosis not present

## 2018-10-31 DIAGNOSIS — Z992 Dependence on renal dialysis: Secondary | ICD-10-CM | POA: Diagnosis not present

## 2018-10-31 DIAGNOSIS — N186 End stage renal disease: Secondary | ICD-10-CM | POA: Diagnosis not present

## 2018-10-31 DIAGNOSIS — T82868A Thrombosis of vascular prosthetic devices, implants and grafts, initial encounter: Secondary | ICD-10-CM | POA: Diagnosis not present

## 2018-11-01 DIAGNOSIS — Z992 Dependence on renal dialysis: Secondary | ICD-10-CM | POA: Diagnosis not present

## 2018-11-01 DIAGNOSIS — N186 End stage renal disease: Secondary | ICD-10-CM | POA: Diagnosis not present

## 2018-11-01 DIAGNOSIS — N2581 Secondary hyperparathyroidism of renal origin: Secondary | ICD-10-CM | POA: Diagnosis not present

## 2018-11-03 DIAGNOSIS — N186 End stage renal disease: Secondary | ICD-10-CM | POA: Diagnosis not present

## 2018-11-03 DIAGNOSIS — Z992 Dependence on renal dialysis: Secondary | ICD-10-CM | POA: Diagnosis not present

## 2018-11-03 DIAGNOSIS — N2581 Secondary hyperparathyroidism of renal origin: Secondary | ICD-10-CM | POA: Diagnosis not present

## 2018-11-04 DIAGNOSIS — N186 End stage renal disease: Secondary | ICD-10-CM | POA: Diagnosis not present

## 2018-11-04 DIAGNOSIS — N2581 Secondary hyperparathyroidism of renal origin: Secondary | ICD-10-CM | POA: Diagnosis not present

## 2018-11-04 DIAGNOSIS — Z992 Dependence on renal dialysis: Secondary | ICD-10-CM | POA: Diagnosis not present

## 2018-11-04 DIAGNOSIS — E8770 Fluid overload, unspecified: Secondary | ICD-10-CM | POA: Diagnosis not present

## 2018-11-05 DIAGNOSIS — Z992 Dependence on renal dialysis: Secondary | ICD-10-CM | POA: Diagnosis not present

## 2018-11-05 DIAGNOSIS — N186 End stage renal disease: Secondary | ICD-10-CM | POA: Diagnosis not present

## 2018-11-05 DIAGNOSIS — N2581 Secondary hyperparathyroidism of renal origin: Secondary | ICD-10-CM | POA: Diagnosis not present

## 2018-11-08 DIAGNOSIS — Z992 Dependence on renal dialysis: Secondary | ICD-10-CM | POA: Diagnosis not present

## 2018-11-08 DIAGNOSIS — N2581 Secondary hyperparathyroidism of renal origin: Secondary | ICD-10-CM | POA: Diagnosis not present

## 2018-11-08 DIAGNOSIS — N186 End stage renal disease: Secondary | ICD-10-CM | POA: Diagnosis not present

## 2018-11-10 DIAGNOSIS — Z992 Dependence on renal dialysis: Secondary | ICD-10-CM | POA: Diagnosis not present

## 2018-11-10 DIAGNOSIS — N186 End stage renal disease: Secondary | ICD-10-CM | POA: Diagnosis not present

## 2018-11-10 DIAGNOSIS — N2581 Secondary hyperparathyroidism of renal origin: Secondary | ICD-10-CM | POA: Diagnosis not present

## 2018-11-12 DIAGNOSIS — N186 End stage renal disease: Secondary | ICD-10-CM | POA: Diagnosis not present

## 2018-11-12 DIAGNOSIS — Z992 Dependence on renal dialysis: Secondary | ICD-10-CM | POA: Diagnosis not present

## 2018-11-12 DIAGNOSIS — N2581 Secondary hyperparathyroidism of renal origin: Secondary | ICD-10-CM | POA: Diagnosis not present

## 2018-11-15 DIAGNOSIS — Z23 Encounter for immunization: Secondary | ICD-10-CM | POA: Diagnosis not present

## 2018-11-15 DIAGNOSIS — Z992 Dependence on renal dialysis: Secondary | ICD-10-CM | POA: Diagnosis not present

## 2018-11-15 DIAGNOSIS — N186 End stage renal disease: Secondary | ICD-10-CM | POA: Diagnosis not present

## 2018-11-15 DIAGNOSIS — N139 Obstructive and reflux uropathy, unspecified: Secondary | ICD-10-CM | POA: Diagnosis not present

## 2018-11-15 DIAGNOSIS — E8770 Fluid overload, unspecified: Secondary | ICD-10-CM | POA: Diagnosis not present

## 2018-11-15 DIAGNOSIS — N2581 Secondary hyperparathyroidism of renal origin: Secondary | ICD-10-CM | POA: Diagnosis not present

## 2018-11-16 DIAGNOSIS — Z992 Dependence on renal dialysis: Secondary | ICD-10-CM | POA: Diagnosis not present

## 2018-11-16 DIAGNOSIS — E8770 Fluid overload, unspecified: Secondary | ICD-10-CM | POA: Diagnosis not present

## 2018-11-16 DIAGNOSIS — N186 End stage renal disease: Secondary | ICD-10-CM | POA: Diagnosis not present

## 2018-11-16 DIAGNOSIS — N2581 Secondary hyperparathyroidism of renal origin: Secondary | ICD-10-CM | POA: Diagnosis not present

## 2018-11-17 DIAGNOSIS — Z23 Encounter for immunization: Secondary | ICD-10-CM | POA: Diagnosis not present

## 2018-11-17 DIAGNOSIS — N2581 Secondary hyperparathyroidism of renal origin: Secondary | ICD-10-CM | POA: Diagnosis not present

## 2018-11-17 DIAGNOSIS — Z992 Dependence on renal dialysis: Secondary | ICD-10-CM | POA: Diagnosis not present

## 2018-11-17 DIAGNOSIS — N186 End stage renal disease: Secondary | ICD-10-CM | POA: Diagnosis not present

## 2018-11-19 DIAGNOSIS — Z992 Dependence on renal dialysis: Secondary | ICD-10-CM | POA: Diagnosis not present

## 2018-11-19 DIAGNOSIS — N2581 Secondary hyperparathyroidism of renal origin: Secondary | ICD-10-CM | POA: Diagnosis not present

## 2018-11-19 DIAGNOSIS — N186 End stage renal disease: Secondary | ICD-10-CM | POA: Diagnosis not present

## 2018-11-19 DIAGNOSIS — Z23 Encounter for immunization: Secondary | ICD-10-CM | POA: Diagnosis not present

## 2018-11-22 DIAGNOSIS — N186 End stage renal disease: Secondary | ICD-10-CM | POA: Diagnosis not present

## 2018-11-22 DIAGNOSIS — Z992 Dependence on renal dialysis: Secondary | ICD-10-CM | POA: Diagnosis not present

## 2018-11-22 DIAGNOSIS — N2581 Secondary hyperparathyroidism of renal origin: Secondary | ICD-10-CM | POA: Diagnosis not present

## 2018-11-22 DIAGNOSIS — Z23 Encounter for immunization: Secondary | ICD-10-CM | POA: Diagnosis not present

## 2018-11-24 DIAGNOSIS — Z23 Encounter for immunization: Secondary | ICD-10-CM | POA: Diagnosis not present

## 2018-11-24 DIAGNOSIS — Z992 Dependence on renal dialysis: Secondary | ICD-10-CM | POA: Diagnosis not present

## 2018-11-24 DIAGNOSIS — N2581 Secondary hyperparathyroidism of renal origin: Secondary | ICD-10-CM | POA: Diagnosis not present

## 2018-11-24 DIAGNOSIS — N186 End stage renal disease: Secondary | ICD-10-CM | POA: Diagnosis not present

## 2018-11-26 DIAGNOSIS — Z23 Encounter for immunization: Secondary | ICD-10-CM | POA: Diagnosis not present

## 2018-11-26 DIAGNOSIS — N2581 Secondary hyperparathyroidism of renal origin: Secondary | ICD-10-CM | POA: Diagnosis not present

## 2018-11-26 DIAGNOSIS — Z992 Dependence on renal dialysis: Secondary | ICD-10-CM | POA: Diagnosis not present

## 2018-11-26 DIAGNOSIS — N186 End stage renal disease: Secondary | ICD-10-CM | POA: Diagnosis not present

## 2018-11-29 DIAGNOSIS — Z23 Encounter for immunization: Secondary | ICD-10-CM | POA: Diagnosis not present

## 2018-11-29 DIAGNOSIS — N186 End stage renal disease: Secondary | ICD-10-CM | POA: Diagnosis not present

## 2018-11-29 DIAGNOSIS — Z992 Dependence on renal dialysis: Secondary | ICD-10-CM | POA: Diagnosis not present

## 2018-11-29 DIAGNOSIS — N2581 Secondary hyperparathyroidism of renal origin: Secondary | ICD-10-CM | POA: Diagnosis not present

## 2018-11-30 DIAGNOSIS — E039 Hypothyroidism, unspecified: Secondary | ICD-10-CM | POA: Diagnosis not present

## 2018-11-30 DIAGNOSIS — F324 Major depressive disorder, single episode, in partial remission: Secondary | ICD-10-CM | POA: Diagnosis not present

## 2018-11-30 DIAGNOSIS — F332 Major depressive disorder, recurrent severe without psychotic features: Secondary | ICD-10-CM | POA: Diagnosis not present

## 2018-11-30 DIAGNOSIS — H269 Unspecified cataract: Secondary | ICD-10-CM | POA: Diagnosis not present

## 2018-11-30 DIAGNOSIS — N186 End stage renal disease: Secondary | ICD-10-CM | POA: Diagnosis not present

## 2018-11-30 DIAGNOSIS — F329 Major depressive disorder, single episode, unspecified: Secondary | ICD-10-CM | POA: Diagnosis not present

## 2018-11-30 DIAGNOSIS — M174 Other bilateral secondary osteoarthritis of knee: Secondary | ICD-10-CM | POA: Diagnosis not present

## 2018-12-01 DIAGNOSIS — N186 End stage renal disease: Secondary | ICD-10-CM | POA: Diagnosis not present

## 2018-12-01 DIAGNOSIS — N2581 Secondary hyperparathyroidism of renal origin: Secondary | ICD-10-CM | POA: Diagnosis not present

## 2018-12-01 DIAGNOSIS — Z992 Dependence on renal dialysis: Secondary | ICD-10-CM | POA: Diagnosis not present

## 2018-12-01 DIAGNOSIS — Z23 Encounter for immunization: Secondary | ICD-10-CM | POA: Diagnosis not present

## 2018-12-03 DIAGNOSIS — Z23 Encounter for immunization: Secondary | ICD-10-CM | POA: Diagnosis not present

## 2018-12-03 DIAGNOSIS — Z992 Dependence on renal dialysis: Secondary | ICD-10-CM | POA: Diagnosis not present

## 2018-12-03 DIAGNOSIS — N186 End stage renal disease: Secondary | ICD-10-CM | POA: Diagnosis not present

## 2018-12-03 DIAGNOSIS — N2581 Secondary hyperparathyroidism of renal origin: Secondary | ICD-10-CM | POA: Diagnosis not present

## 2018-12-06 DIAGNOSIS — Z23 Encounter for immunization: Secondary | ICD-10-CM | POA: Diagnosis not present

## 2018-12-06 DIAGNOSIS — N186 End stage renal disease: Secondary | ICD-10-CM | POA: Diagnosis not present

## 2018-12-06 DIAGNOSIS — N2581 Secondary hyperparathyroidism of renal origin: Secondary | ICD-10-CM | POA: Diagnosis not present

## 2018-12-06 DIAGNOSIS — Z992 Dependence on renal dialysis: Secondary | ICD-10-CM | POA: Diagnosis not present

## 2018-12-08 DIAGNOSIS — N2581 Secondary hyperparathyroidism of renal origin: Secondary | ICD-10-CM | POA: Diagnosis not present

## 2018-12-08 DIAGNOSIS — Z992 Dependence on renal dialysis: Secondary | ICD-10-CM | POA: Diagnosis not present

## 2018-12-08 DIAGNOSIS — N186 End stage renal disease: Secondary | ICD-10-CM | POA: Diagnosis not present

## 2018-12-08 DIAGNOSIS — Z23 Encounter for immunization: Secondary | ICD-10-CM | POA: Diagnosis not present

## 2018-12-10 DIAGNOSIS — N186 End stage renal disease: Secondary | ICD-10-CM | POA: Diagnosis not present

## 2018-12-10 DIAGNOSIS — Z23 Encounter for immunization: Secondary | ICD-10-CM | POA: Diagnosis not present

## 2018-12-10 DIAGNOSIS — N2581 Secondary hyperparathyroidism of renal origin: Secondary | ICD-10-CM | POA: Diagnosis not present

## 2018-12-10 DIAGNOSIS — Z992 Dependence on renal dialysis: Secondary | ICD-10-CM | POA: Diagnosis not present

## 2018-12-13 DIAGNOSIS — N2581 Secondary hyperparathyroidism of renal origin: Secondary | ICD-10-CM | POA: Diagnosis not present

## 2018-12-13 DIAGNOSIS — Z23 Encounter for immunization: Secondary | ICD-10-CM | POA: Diagnosis not present

## 2018-12-13 DIAGNOSIS — N186 End stage renal disease: Secondary | ICD-10-CM | POA: Diagnosis not present

## 2018-12-13 DIAGNOSIS — Z992 Dependence on renal dialysis: Secondary | ICD-10-CM | POA: Diagnosis not present

## 2018-12-15 DIAGNOSIS — N2581 Secondary hyperparathyroidism of renal origin: Secondary | ICD-10-CM | POA: Diagnosis not present

## 2018-12-15 DIAGNOSIS — Z992 Dependence on renal dialysis: Secondary | ICD-10-CM | POA: Diagnosis not present

## 2018-12-15 DIAGNOSIS — N186 End stage renal disease: Secondary | ICD-10-CM | POA: Diagnosis not present

## 2018-12-15 DIAGNOSIS — N139 Obstructive and reflux uropathy, unspecified: Secondary | ICD-10-CM | POA: Diagnosis not present

## 2018-12-17 DIAGNOSIS — N186 End stage renal disease: Secondary | ICD-10-CM | POA: Diagnosis not present

## 2018-12-17 DIAGNOSIS — Z992 Dependence on renal dialysis: Secondary | ICD-10-CM | POA: Diagnosis not present

## 2018-12-17 DIAGNOSIS — N2581 Secondary hyperparathyroidism of renal origin: Secondary | ICD-10-CM | POA: Diagnosis not present

## 2018-12-20 DIAGNOSIS — N2581 Secondary hyperparathyroidism of renal origin: Secondary | ICD-10-CM | POA: Diagnosis not present

## 2018-12-20 DIAGNOSIS — N186 End stage renal disease: Secondary | ICD-10-CM | POA: Diagnosis not present

## 2018-12-20 DIAGNOSIS — Z992 Dependence on renal dialysis: Secondary | ICD-10-CM | POA: Diagnosis not present

## 2018-12-22 DIAGNOSIS — N2581 Secondary hyperparathyroidism of renal origin: Secondary | ICD-10-CM | POA: Diagnosis not present

## 2018-12-22 DIAGNOSIS — N186 End stage renal disease: Secondary | ICD-10-CM | POA: Diagnosis not present

## 2018-12-22 DIAGNOSIS — Z992 Dependence on renal dialysis: Secondary | ICD-10-CM | POA: Diagnosis not present

## 2018-12-24 DIAGNOSIS — Z992 Dependence on renal dialysis: Secondary | ICD-10-CM | POA: Diagnosis not present

## 2018-12-24 DIAGNOSIS — N2581 Secondary hyperparathyroidism of renal origin: Secondary | ICD-10-CM | POA: Diagnosis not present

## 2018-12-24 DIAGNOSIS — N186 End stage renal disease: Secondary | ICD-10-CM | POA: Diagnosis not present

## 2018-12-27 DIAGNOSIS — Z992 Dependence on renal dialysis: Secondary | ICD-10-CM | POA: Diagnosis not present

## 2018-12-27 DIAGNOSIS — N2581 Secondary hyperparathyroidism of renal origin: Secondary | ICD-10-CM | POA: Diagnosis not present

## 2018-12-27 DIAGNOSIS — N186 End stage renal disease: Secondary | ICD-10-CM | POA: Diagnosis not present

## 2018-12-29 DIAGNOSIS — N2581 Secondary hyperparathyroidism of renal origin: Secondary | ICD-10-CM | POA: Diagnosis not present

## 2018-12-29 DIAGNOSIS — Z992 Dependence on renal dialysis: Secondary | ICD-10-CM | POA: Diagnosis not present

## 2018-12-29 DIAGNOSIS — N186 End stage renal disease: Secondary | ICD-10-CM | POA: Diagnosis not present

## 2018-12-31 DIAGNOSIS — N2581 Secondary hyperparathyroidism of renal origin: Secondary | ICD-10-CM | POA: Diagnosis not present

## 2018-12-31 DIAGNOSIS — Z992 Dependence on renal dialysis: Secondary | ICD-10-CM | POA: Diagnosis not present

## 2018-12-31 DIAGNOSIS — N186 End stage renal disease: Secondary | ICD-10-CM | POA: Diagnosis not present

## 2019-01-03 DIAGNOSIS — N186 End stage renal disease: Secondary | ICD-10-CM | POA: Diagnosis not present

## 2019-01-03 DIAGNOSIS — N2581 Secondary hyperparathyroidism of renal origin: Secondary | ICD-10-CM | POA: Diagnosis not present

## 2019-01-03 DIAGNOSIS — Z992 Dependence on renal dialysis: Secondary | ICD-10-CM | POA: Diagnosis not present

## 2019-01-05 DIAGNOSIS — Z992 Dependence on renal dialysis: Secondary | ICD-10-CM | POA: Diagnosis not present

## 2019-01-05 DIAGNOSIS — N2581 Secondary hyperparathyroidism of renal origin: Secondary | ICD-10-CM | POA: Diagnosis not present

## 2019-01-05 DIAGNOSIS — E039 Hypothyroidism, unspecified: Secondary | ICD-10-CM | POA: Diagnosis not present

## 2019-01-05 DIAGNOSIS — N186 End stage renal disease: Secondary | ICD-10-CM | POA: Diagnosis not present

## 2019-01-07 DIAGNOSIS — N2581 Secondary hyperparathyroidism of renal origin: Secondary | ICD-10-CM | POA: Diagnosis not present

## 2019-01-07 DIAGNOSIS — Z992 Dependence on renal dialysis: Secondary | ICD-10-CM | POA: Diagnosis not present

## 2019-01-07 DIAGNOSIS — N186 End stage renal disease: Secondary | ICD-10-CM | POA: Diagnosis not present

## 2019-01-10 DIAGNOSIS — N186 End stage renal disease: Secondary | ICD-10-CM | POA: Diagnosis not present

## 2019-01-10 DIAGNOSIS — Z992 Dependence on renal dialysis: Secondary | ICD-10-CM | POA: Diagnosis not present

## 2019-01-10 DIAGNOSIS — N2581 Secondary hyperparathyroidism of renal origin: Secondary | ICD-10-CM | POA: Diagnosis not present

## 2019-01-12 DIAGNOSIS — N186 End stage renal disease: Secondary | ICD-10-CM | POA: Diagnosis not present

## 2019-01-12 DIAGNOSIS — Z992 Dependence on renal dialysis: Secondary | ICD-10-CM | POA: Diagnosis not present

## 2019-01-12 DIAGNOSIS — N2581 Secondary hyperparathyroidism of renal origin: Secondary | ICD-10-CM | POA: Diagnosis not present

## 2019-01-14 DIAGNOSIS — N2581 Secondary hyperparathyroidism of renal origin: Secondary | ICD-10-CM | POA: Diagnosis not present

## 2019-01-14 DIAGNOSIS — N186 End stage renal disease: Secondary | ICD-10-CM | POA: Diagnosis not present

## 2019-01-14 DIAGNOSIS — Z992 Dependence on renal dialysis: Secondary | ICD-10-CM | POA: Diagnosis not present

## 2019-01-15 DIAGNOSIS — N186 End stage renal disease: Secondary | ICD-10-CM | POA: Diagnosis not present

## 2019-01-15 DIAGNOSIS — N139 Obstructive and reflux uropathy, unspecified: Secondary | ICD-10-CM | POA: Diagnosis not present

## 2019-01-15 DIAGNOSIS — Z992 Dependence on renal dialysis: Secondary | ICD-10-CM | POA: Diagnosis not present

## 2019-01-17 DIAGNOSIS — N186 End stage renal disease: Secondary | ICD-10-CM | POA: Diagnosis not present

## 2019-01-17 DIAGNOSIS — D689 Coagulation defect, unspecified: Secondary | ICD-10-CM | POA: Diagnosis not present

## 2019-01-17 DIAGNOSIS — N2581 Secondary hyperparathyroidism of renal origin: Secondary | ICD-10-CM | POA: Diagnosis not present

## 2019-01-17 DIAGNOSIS — Z992 Dependence on renal dialysis: Secondary | ICD-10-CM | POA: Diagnosis not present

## 2019-01-19 DIAGNOSIS — Z992 Dependence on renal dialysis: Secondary | ICD-10-CM | POA: Diagnosis not present

## 2019-01-19 DIAGNOSIS — N186 End stage renal disease: Secondary | ICD-10-CM | POA: Diagnosis not present

## 2019-01-19 DIAGNOSIS — D689 Coagulation defect, unspecified: Secondary | ICD-10-CM | POA: Diagnosis not present

## 2019-01-19 DIAGNOSIS — N2581 Secondary hyperparathyroidism of renal origin: Secondary | ICD-10-CM | POA: Diagnosis not present

## 2019-01-21 DIAGNOSIS — N2581 Secondary hyperparathyroidism of renal origin: Secondary | ICD-10-CM | POA: Diagnosis not present

## 2019-01-21 DIAGNOSIS — Z992 Dependence on renal dialysis: Secondary | ICD-10-CM | POA: Diagnosis not present

## 2019-01-21 DIAGNOSIS — D689 Coagulation defect, unspecified: Secondary | ICD-10-CM | POA: Diagnosis not present

## 2019-01-21 DIAGNOSIS — N186 End stage renal disease: Secondary | ICD-10-CM | POA: Diagnosis not present

## 2019-01-23 DIAGNOSIS — F329 Major depressive disorder, single episode, unspecified: Secondary | ICD-10-CM | POA: Diagnosis not present

## 2019-01-23 DIAGNOSIS — H269 Unspecified cataract: Secondary | ICD-10-CM | POA: Diagnosis not present

## 2019-01-23 DIAGNOSIS — E039 Hypothyroidism, unspecified: Secondary | ICD-10-CM | POA: Diagnosis not present

## 2019-01-23 DIAGNOSIS — N186 End stage renal disease: Secondary | ICD-10-CM | POA: Diagnosis not present

## 2019-01-23 DIAGNOSIS — Z992 Dependence on renal dialysis: Secondary | ICD-10-CM | POA: Diagnosis not present

## 2019-01-23 DIAGNOSIS — F332 Major depressive disorder, recurrent severe without psychotic features: Secondary | ICD-10-CM | POA: Diagnosis not present

## 2019-01-23 DIAGNOSIS — M174 Other bilateral secondary osteoarthritis of knee: Secondary | ICD-10-CM | POA: Diagnosis not present

## 2019-01-23 DIAGNOSIS — T8249XA Other complication of vascular dialysis catheter, initial encounter: Secondary | ICD-10-CM | POA: Diagnosis not present

## 2019-01-23 DIAGNOSIS — F324 Major depressive disorder, single episode, in partial remission: Secondary | ICD-10-CM | POA: Diagnosis not present

## 2019-01-25 DIAGNOSIS — N2581 Secondary hyperparathyroidism of renal origin: Secondary | ICD-10-CM | POA: Diagnosis not present

## 2019-01-25 DIAGNOSIS — N186 End stage renal disease: Secondary | ICD-10-CM | POA: Diagnosis not present

## 2019-01-25 DIAGNOSIS — Z992 Dependence on renal dialysis: Secondary | ICD-10-CM | POA: Diagnosis not present

## 2019-01-25 DIAGNOSIS — D689 Coagulation defect, unspecified: Secondary | ICD-10-CM | POA: Diagnosis not present

## 2019-01-26 DIAGNOSIS — N2581 Secondary hyperparathyroidism of renal origin: Secondary | ICD-10-CM | POA: Diagnosis not present

## 2019-01-26 DIAGNOSIS — D689 Coagulation defect, unspecified: Secondary | ICD-10-CM | POA: Diagnosis not present

## 2019-01-26 DIAGNOSIS — Z992 Dependence on renal dialysis: Secondary | ICD-10-CM | POA: Diagnosis not present

## 2019-01-26 DIAGNOSIS — N186 End stage renal disease: Secondary | ICD-10-CM | POA: Diagnosis not present

## 2019-01-28 DIAGNOSIS — Z992 Dependence on renal dialysis: Secondary | ICD-10-CM | POA: Diagnosis not present

## 2019-01-28 DIAGNOSIS — N2581 Secondary hyperparathyroidism of renal origin: Secondary | ICD-10-CM | POA: Diagnosis not present

## 2019-01-28 DIAGNOSIS — D689 Coagulation defect, unspecified: Secondary | ICD-10-CM | POA: Diagnosis not present

## 2019-01-28 DIAGNOSIS — N186 End stage renal disease: Secondary | ICD-10-CM | POA: Diagnosis not present

## 2019-01-31 DIAGNOSIS — N2581 Secondary hyperparathyroidism of renal origin: Secondary | ICD-10-CM | POA: Diagnosis not present

## 2019-01-31 DIAGNOSIS — N186 End stage renal disease: Secondary | ICD-10-CM | POA: Diagnosis not present

## 2019-01-31 DIAGNOSIS — D689 Coagulation defect, unspecified: Secondary | ICD-10-CM | POA: Diagnosis not present

## 2019-01-31 DIAGNOSIS — Z992 Dependence on renal dialysis: Secondary | ICD-10-CM | POA: Diagnosis not present

## 2019-02-02 DIAGNOSIS — D689 Coagulation defect, unspecified: Secondary | ICD-10-CM | POA: Diagnosis not present

## 2019-02-02 DIAGNOSIS — Z992 Dependence on renal dialysis: Secondary | ICD-10-CM | POA: Diagnosis not present

## 2019-02-02 DIAGNOSIS — N186 End stage renal disease: Secondary | ICD-10-CM | POA: Diagnosis not present

## 2019-02-02 DIAGNOSIS — N2581 Secondary hyperparathyroidism of renal origin: Secondary | ICD-10-CM | POA: Diagnosis not present

## 2019-02-04 DIAGNOSIS — N186 End stage renal disease: Secondary | ICD-10-CM | POA: Diagnosis not present

## 2019-02-04 DIAGNOSIS — D689 Coagulation defect, unspecified: Secondary | ICD-10-CM | POA: Diagnosis not present

## 2019-02-04 DIAGNOSIS — N2581 Secondary hyperparathyroidism of renal origin: Secondary | ICD-10-CM | POA: Diagnosis not present

## 2019-02-04 DIAGNOSIS — Z992 Dependence on renal dialysis: Secondary | ICD-10-CM | POA: Diagnosis not present

## 2019-02-06 DIAGNOSIS — N2581 Secondary hyperparathyroidism of renal origin: Secondary | ICD-10-CM | POA: Diagnosis not present

## 2019-02-06 DIAGNOSIS — N186 End stage renal disease: Secondary | ICD-10-CM | POA: Diagnosis not present

## 2019-02-06 DIAGNOSIS — Z992 Dependence on renal dialysis: Secondary | ICD-10-CM | POA: Diagnosis not present

## 2019-02-06 DIAGNOSIS — D689 Coagulation defect, unspecified: Secondary | ICD-10-CM | POA: Diagnosis not present

## 2019-02-11 ENCOUNTER — Other Ambulatory Visit: Payer: Self-pay

## 2019-02-11 ENCOUNTER — Encounter (HOSPITAL_COMMUNITY): Payer: Self-pay | Admitting: *Deleted

## 2019-02-11 ENCOUNTER — Inpatient Hospital Stay (HOSPITAL_COMMUNITY)
Admission: EM | Admit: 2019-02-11 | Discharge: 2019-02-14 | DRG: 640 | Disposition: A | Discharge status: Home / Self Care | Payer: Medicare Other | Attending: Internal Medicine | Admitting: Internal Medicine

## 2019-02-11 DIAGNOSIS — Y92230 Patient room in hospital as the place of occurrence of the external cause: Secondary | ICD-10-CM | POA: Diagnosis not present

## 2019-02-11 DIAGNOSIS — E669 Obesity, unspecified: Secondary | ICD-10-CM | POA: Diagnosis not present

## 2019-02-11 DIAGNOSIS — I739 Peripheral vascular disease, unspecified: Secondary | ICD-10-CM | POA: Diagnosis present

## 2019-02-11 DIAGNOSIS — Z87440 Personal history of urinary (tract) infections: Secondary | ICD-10-CM | POA: Diagnosis not present

## 2019-02-11 DIAGNOSIS — F329 Major depressive disorder, single episode, unspecified: Secondary | ICD-10-CM | POA: Diagnosis present

## 2019-02-11 DIAGNOSIS — W19XXXA Unspecified fall, initial encounter: Secondary | ICD-10-CM | POA: Diagnosis not present

## 2019-02-11 DIAGNOSIS — I10 Essential (primary) hypertension: Secondary | ICD-10-CM | POA: Diagnosis not present

## 2019-02-11 DIAGNOSIS — E872 Acidosis: Secondary | ICD-10-CM | POA: Diagnosis not present

## 2019-02-11 DIAGNOSIS — R001 Bradycardia, unspecified: Secondary | ICD-10-CM | POA: Diagnosis not present

## 2019-02-11 DIAGNOSIS — D72829 Elevated white blood cell count, unspecified: Secondary | ICD-10-CM | POA: Diagnosis not present

## 2019-02-11 DIAGNOSIS — K521 Toxic gastroenteritis and colitis: Secondary | ICD-10-CM | POA: Diagnosis not present

## 2019-02-11 DIAGNOSIS — Z79890 Hormone replacement therapy (postmenopausal): Secondary | ICD-10-CM

## 2019-02-11 DIAGNOSIS — Z79899 Other long term (current) drug therapy: Secondary | ICD-10-CM | POA: Diagnosis not present

## 2019-02-11 DIAGNOSIS — Z8541 Personal history of malignant neoplasm of cervix uteri: Secondary | ICD-10-CM | POA: Diagnosis not present

## 2019-02-11 DIAGNOSIS — R531 Weakness: Secondary | ICD-10-CM | POA: Diagnosis not present

## 2019-02-11 DIAGNOSIS — N2581 Secondary hyperparathyroidism of renal origin: Secondary | ICD-10-CM | POA: Diagnosis present

## 2019-02-11 DIAGNOSIS — T503X5A Adverse effect of electrolytic, caloric and water-balance agents, initial encounter: Secondary | ICD-10-CM | POA: Diagnosis not present

## 2019-02-11 DIAGNOSIS — E875 Hyperkalemia: Principal | ICD-10-CM | POA: Diagnosis present

## 2019-02-11 DIAGNOSIS — Z888 Allergy status to other drugs, medicaments and biological substances status: Secondary | ICD-10-CM

## 2019-02-11 DIAGNOSIS — Z20828 Contact with and (suspected) exposure to other viral communicable diseases: Secondary | ICD-10-CM | POA: Diagnosis not present

## 2019-02-11 DIAGNOSIS — Z6835 Body mass index (BMI) 35.0-35.9, adult: Secondary | ICD-10-CM | POA: Diagnosis not present

## 2019-02-11 DIAGNOSIS — E8770 Fluid overload, unspecified: Secondary | ICD-10-CM | POA: Diagnosis not present

## 2019-02-11 DIAGNOSIS — E871 Hypo-osmolality and hyponatremia: Secondary | ICD-10-CM | POA: Diagnosis not present

## 2019-02-11 DIAGNOSIS — Z992 Dependence on renal dialysis: Secondary | ICD-10-CM

## 2019-02-11 DIAGNOSIS — Z8349 Family history of other endocrine, nutritional and metabolic diseases: Secondary | ICD-10-CM | POA: Diagnosis not present

## 2019-02-11 DIAGNOSIS — E8729 Other acidosis: Secondary | ICD-10-CM | POA: Diagnosis present

## 2019-02-11 DIAGNOSIS — E039 Hypothyroidism, unspecified: Secondary | ICD-10-CM | POA: Diagnosis not present

## 2019-02-11 DIAGNOSIS — N186 End stage renal disease: Secondary | ICD-10-CM | POA: Diagnosis not present

## 2019-02-11 DIAGNOSIS — R52 Pain, unspecified: Secondary | ICD-10-CM | POA: Diagnosis not present

## 2019-02-11 LAB — BASIC METABOLIC PANEL
Anion gap: 23 — ABNORMAL HIGH (ref 5–15)
Anion gap: 18 — ABNORMAL HIGH (ref 5–15)
BUN: 112 mg/dL — ABNORMAL HIGH (ref 8–23)
BUN: 32 mg/dL — ABNORMAL HIGH (ref 8–23)
CO2: 20 mmol/L — ABNORMAL LOW (ref 22–32)
CO2: 25 mmol/L (ref 22–32)
Calcium: 8.2 mg/dL — ABNORMAL LOW (ref 8.9–10.3)
Calcium: 8.5 mg/dL — ABNORMAL LOW (ref 8.9–10.3)
Chloride: 91 mmol/L — ABNORMAL LOW (ref 98–111)
Chloride: 95 mmol/L — ABNORMAL LOW (ref 98–111)
Creatinine, Ser: 15.68 mg/dL — ABNORMAL HIGH (ref 0.44–1.00)
Creatinine, Ser: 7.53 mg/dL — ABNORMAL HIGH (ref 0.44–1.00)
GFR calc Af Amer: 2 mL/min — ABNORMAL LOW (ref >60)
GFR calc Af Amer: 6 mL/min — ABNORMAL LOW (ref >60)
GFR calc non Af Amer: 2 mL/min — ABNORMAL LOW (ref >60)
GFR calc non Af Amer: 5 mL/min — ABNORMAL LOW (ref >60)
Glucose, Bld: 89 mg/dL (ref 70–99)
Glucose, Bld: 81 mg/dL (ref 70–99)
Potassium: >7.5 mmol/L (ref 3.5–5.1)
Potassium: 4.9 mmol/L (ref 3.5–5.1)
Sodium: 134 mmol/L — ABNORMAL LOW (ref 135–145)
Sodium: 138 mmol/L (ref 135–145)

## 2019-02-11 LAB — CBC
HCT: 44 % (ref 36.0–46.0)
Hemoglobin: 14.2 g/dL (ref 12.0–15.0)
MCH: 33.4 pg (ref 26.0–34.0)
MCHC: 32.3 g/dL (ref 30.0–36.0)
MCV: 103.5 fL — ABNORMAL HIGH (ref 80.0–100.0)
Platelets: 279 10*3/uL (ref 150–400)
RBC: 4.25 MIL/uL (ref 3.87–5.11)
RDW: 14.6 % (ref 11.5–15.5)
WBC: 11.2 10*3/uL — ABNORMAL HIGH (ref 4.0–10.5)
nRBC: 0 % (ref 0.0–0.2)

## 2019-02-11 LAB — SARS CORONAVIRUS 2 BY RT PCR (HOSPITAL ORDER, PERFORMED IN ~~LOC~~ HOSPITAL LAB): SARS Coronavirus 2: NEGATIVE

## 2019-02-11 LAB — CBG MONITORING, ED: Glucose-Capillary: 87 mg/dL (ref 70–99)

## 2019-02-11 LAB — HIV ANTIBODY (ROUTINE TESTING W REFLEX): HIV Screen 4th Generation wRfx: NONREACTIVE

## 2019-02-11 MED ORDER — ACETAMINOPHEN 325 MG PO TABS
650.0000 mg | ORAL_TABLET | Freq: Four times a day (QID) | ORAL | Status: DC | PRN
  Administered 2019-02-12 – 2019-02-13 (×2): 650 mg via ORAL
  Filled 2019-02-11 (×2): qty 2

## 2019-02-11 MED ORDER — ZOLPIDEM TARTRATE 5 MG PO TABS
10.0000 mg | ORAL_TABLET | Freq: Every evening | ORAL | Status: DC | PRN
  Administered 2019-02-12 – 2019-02-13 (×2): 10 mg via ORAL
  Filled 2019-02-11 (×2): qty 2

## 2019-02-11 MED ORDER — ALBUTEROL SULFATE (2.5 MG/3ML) 0.083% IN NEBU: 2.5000 mg | INHALATION_SOLUTION | Freq: Four times a day (QID) | RESPIRATORY_TRACT | Status: DC | PRN

## 2019-02-11 MED ORDER — HEPARIN SODIUM (PORCINE) 5000 UNIT/ML IJ SOLN
5000.0000 [IU] | Freq: Three times a day (TID) | INTRAMUSCULAR | Status: DC
  Administered 2019-02-11 – 2019-02-14 (×8): 5000 [IU] via SUBCUTANEOUS
  Filled 2019-02-11 (×9): qty 1

## 2019-02-11 MED ORDER — DEXTROSE 50 % IV SOLN
1.0000 | Freq: Once | INTRAVENOUS | Status: AC
  Administered 2019-02-11: 50 mL via INTRAVENOUS
  Filled 2019-02-11: qty 50

## 2019-02-11 MED ORDER — GABAPENTIN 100 MG PO CAPS
100.0000 mg | ORAL_CAPSULE | Freq: Every day | ORAL | Status: DC
  Administered 2019-02-11 – 2019-02-13 (×3): 100 mg via ORAL
  Filled 2019-02-11 (×3): qty 1

## 2019-02-11 MED ORDER — SODIUM CHLORIDE 0.9% FLUSH
3.0000 mL | Freq: Once | INTRAVENOUS | Status: AC
  Administered 2019-02-11: 3 mL via INTRAVENOUS

## 2019-02-11 MED ORDER — FERRIC CITRATE 1 GM 210 MG(FE) PO TABS: 210.0000 mg | ORAL_TABLET | Freq: Three times a day (TID) | ORAL | Status: DC | PRN

## 2019-02-11 MED ORDER — SERTRALINE HCL 100 MG PO TABS
100.0000 mg | ORAL_TABLET | Freq: Every day | ORAL | Status: DC
  Administered 2019-02-11 – 2019-02-14 (×4): 100 mg via ORAL
  Filled 2019-02-11 (×4): qty 1

## 2019-02-11 MED ORDER — CINACALCET HCL 30 MG PO TABS
90.0000 mg | ORAL_TABLET | Freq: Every day | ORAL | Status: DC
  Administered 2019-02-12 – 2019-02-13 (×2): 90 mg via ORAL
  Filled 2019-02-11 (×3): qty 3

## 2019-02-11 MED ORDER — HEPARIN SODIUM (PORCINE) 1000 UNIT/ML IJ SOLN
INTRAMUSCULAR | Status: AC
  Administered 2019-02-11: 18:00:00
  Filled 2019-02-11: qty 8

## 2019-02-11 MED ORDER — ALBUTEROL SULFATE (2.5 MG/3ML) 0.083% IN NEBU
10.0000 mg | INHALATION_SOLUTION | Freq: Once | RESPIRATORY_TRACT | Status: DC
  Filled 2019-02-11: qty 12

## 2019-02-11 MED ORDER — CALCIUM ACETATE (PHOS BINDER) 667 MG PO CAPS: 667.0000 mg | ORAL_CAPSULE | Freq: Three times a day (TID) | ORAL | Status: DC | PRN

## 2019-02-11 MED ORDER — MIDODRINE HCL 5 MG PO TABS
10.0000 mg | ORAL_TABLET | ORAL | Status: DC
  Filled 2019-02-11: qty 2

## 2019-02-11 MED ORDER — LEVOTHYROXINE SODIUM 25 MCG PO TABS
25.0000 ug | ORAL_TABLET | Freq: Every day | ORAL | Status: DC
  Administered 2019-02-12 – 2019-02-14 (×3): 25 ug via ORAL
  Filled 2019-02-11 (×3): qty 1

## 2019-02-11 MED ORDER — CALCIUM GLUCONATE 10 % IV SOLN
1.0000 g | Freq: Once | INTRAVENOUS | Status: AC
  Administered 2019-02-11: 1 g via INTRAVENOUS
  Filled 2019-02-11: qty 10

## 2019-02-11 MED ORDER — SODIUM BICARBONATE 8.4 % IV SOLN
50.0000 meq | Freq: Once | INTRAVENOUS | Status: AC
  Administered 2019-02-11: 50 meq via INTRAVENOUS

## 2019-02-11 MED ORDER — CHLORHEXIDINE GLUCONATE CLOTH 2 % EX PADS
6.0000 | MEDICATED_PAD | Freq: Every day | CUTANEOUS | Status: DC
  Administered 2019-02-11 – 2019-02-12 (×2): 6 via TOPICAL

## 2019-02-11 MED ORDER — ACETAMINOPHEN 650 MG RE SUPP: 650.0000 mg | Freq: Four times a day (QID) | RECTAL | Status: DC | PRN

## 2019-02-11 MED ORDER — FERRIC CITRATE 1 GM 210 MG(FE) PO TABS
420.0000 mg | ORAL_TABLET | Freq: Three times a day (TID) | ORAL | Status: DC
  Administered 2019-02-12 – 2019-02-14 (×6): 420 mg via ORAL
  Filled 2019-02-11 (×6): qty 2

## 2019-02-11 MED ORDER — HEPARIN SODIUM (PORCINE) 1000 UNIT/ML DIALYSIS
4000.0000 [IU] | Freq: Once | INTRAMUSCULAR | Status: DC
  Administered 2019-02-11: 4000 [IU] via INTRAVENOUS_CENTRAL

## 2019-02-11 MED ORDER — CALCIUM ACETATE (PHOS BINDER) 667 MG PO CAPS
667.0000 mg | ORAL_CAPSULE | Freq: Three times a day (TID) | ORAL | Status: DC
  Administered 2019-02-12 – 2019-02-14 (×6): 667 mg via ORAL
  Filled 2019-02-11 (×6): qty 1

## 2019-02-11 MED ORDER — INSULIN ASPART 100 UNIT/ML IV SOLN
5.0000 [IU] | Freq: Once | INTRAVENOUS | Status: AC
  Administered 2019-02-11: 5 [IU] via INTRAVENOUS

## 2019-02-11 NOTE — Procedures (Addendum)
Heart rate is already up to 76 min in NSR w/ lowK+ bath dialysis.    I was present at this dialysis session, have reviewed the session itself and made  appropriate changes Kelly Splinter MD [place] pager [phone]   02/11/2019, 4:01 PM

## 2019-02-11 NOTE — ED Notes (Signed)
This RN checked on pt, Pt just complains of being tired. BP is normal. HR ranging from 35-45 a-fib. Provider is aware.

## 2019-02-11 NOTE — ED Notes (Signed)
Pt states she does not make urine 

## 2019-02-11 NOTE — Consult Note (Signed)
Renal Service Consult Note [facility]  Mary Wilcox 02/11/2019 [place] Requesting Physician:  Dr Tamala Julian, R.   Reason for Consult:  ESRD pt w/ bradycardia and severe ^ K+ HPI: The patient is a 63 y.o. year-old w/ h/o CKD/ ESRD on HD since 2013 and h/o cervical Ca (2006) presents for gen'd weakness and missed HD since Monday. Fell at night last night due to legs "giving out". In ED K > 7.5 w/ junctional bradycardia but normal BP's.  Given IV Ca/ glu / insulin. Asked to see for dialysis.   Pt seen in ED, missed Wed HD, did not give a reason. Usually makes all her appts she says.  No sob, CP or abd pain, no n/v/d.    ROS  denies CP  no joint pain   no HA  no blurry vision  no rash  no diarrhea  no nausea/ vomiting   Past Medical History  Past Medical History:  Diagnosis Date  . Bilateral hydronephrosis 2006   due to obstruction from cervical cancer  . Cervical cancer ([place]) 2006   IIIB s/p ext radiation and intracavitary cessium  . Chronic kidney disease    hx several episodes of ARF secondary to obstructive uropathy  . Insomnia    takes Ambien 3x wk  . Iron deficiency anemia   . Peripheral vascular disease ([place])   . Secondary hyperparathyroidism (of renal origin)   . Transfusion history    02/2011 for Hgb 6.8   Past Surgical History  Past Surgical History:  Procedure Laterality Date  . AV FISTULA PLACEMENT  03/05/2011   Procedure: INSERTION OF ARTERIOVENOUS (AV) GORE-TEX GRAFT ARM;  Surgeon: Angelia Mould, MD;  Location: [place];  Service: Vascular;  Laterality: Left;  start 1115   finish 1250  . INSERTION OF DIALYSIS CATHETER  03/05/2011   Procedure: INSERTION OF DIALYSIS CATHETER;  Surgeon: Angelia Mould, MD;  Location: [place];  Service: Vascular;  Laterality: Right;  start 1041- finish 1051  . TUBAL LIGATION    . URETER SURGERY     stent, Dr. Risa Grill for bilateral hydro   Family History  Family History  Problem Relation Age  of Onset  . Lung cancer Father   . Cancer Father   . Cancer Mother        kidney and thyroid  . Heart disease Brother   . Diabetes Brother   . Hyperlipidemia Brother   . Hypertension Brother   . Other Brother        varicose vein   Social History  reports that she has never smoked. She has never used smokeless tobacco. She reports that she does not drink alcohol or use drugs. Allergies  Allergies  Allergen Reactions  . Prozac [Fluoxetine Hcl] Hives   Home medications Prior to Admission medications   Medication Sig Start Date End Date Taking? Authorizing Provider  acetaminophen (TYLENOL) 500 MG tablet Take 1,000 mg by mouth every 6 (six) hours as needed for headache (pain).    [provider]  B Complex-C (SUPER B COMPLEX PO) Take 1 tablet by mouth daily.    [provider]  calcium acetate (PHOSLO) 667 MG capsule Take 667 mg by mouth See admin instructions. Take one capsule (667 mg) by mouth three times daily with meals and 2-3 times with snacks. 06/18/11   [provider]  cephALEXin (KEFLEX) 500 MG capsule Take 1 capsule (500 mg total) by mouth daily. 10/01/18   Aline August,  MD  cinacalcet (SENSIPAR) 90 MG tablet Take 90 mg by mouth at bedtime.    [provider]  ferric citrate (AURYXIA) 1 GM 210 MG(Fe) tablet Take 210-420 mg by mouth See admin instructions. Take two tablets (420 mg) by mouth three times daily with meals and one tablet (210 mg) up to 3 times daily with snacks    [provider]  gabapentin (NEURONTIN) 100 MG capsule Take 100 mg by mouth at bedtime. For leg pain 08/06/18   [provider]  levothyroxine (SYNTHROID) 25 MCG tablet Take 25 mcg by mouth daily before breakfast. 08/10/18   [provider]  midodrine (PROAMATINE) 10 MG tablet Take 10 mg by mouth See admin instructions. Take one tablet (10 mg) by mouth twice on dialysis days (Tuesday, Thursday, Saturday) at 4am and 8am 09/11/18   [provider]  sertraline (ZOLOFT) 100 MG tablet Take 100 mg by mouth daily. 09/17/18   [provider]  vitamin B-12 (CYANOCOBALAMIN) 1000 MCG tablet Take 1,000 mcg by mouth daily.    [provider]  zolpidem (AMBIEN) 10 MG tablet Take 10 mg by mouth at bedtime as needed for sleep.     [provider]   Liver Function Tests No results for input(s): AST, ALT, ALKPHOS, BILITOT, PROT, ALBUMIN in the last 168 hours. No results for input(s): LIPASE, AMYLASE in the last 168 hours. CBC Recent Labs  Lab 02/11/19 0815  WBC 11.2*  HGB 14.2  HCT 44.0  MCV 103.5*  PLT 573   Basic Metabolic Panel Recent Labs  Lab 02/11/19 0815  NA 134*  K >7.5*  CL 91*  CO2 20*  GLUCOSE 89  BUN 112*  CREATININE 15.68*  CALCIUM 8.2*   Iron/TIBC/Ferritin/ %Sat    Component Value Date/Time   IRON 73 03/05/2011 0109   TIBC 189 (L) 03/05/2011 0109   FERRITIN 1188 (H) 03/05/2011 0109   IRONPCTSAT 39 03/05/2011 0109    Vitals:   02/11/19 0945 02/11/19 1045 02/11/19 1100 02/11/19 1115  BP: (!) 170/80 (!) 160/79 (!) 160/40   Pulse: 64 66  (!) 38  Resp: 20 20 (!) 21 18  Temp:      TempSrc:      SpO2: 100% 100%  100%  Weight:      Height:        Exam Gen alert, lying flat, no distress, intermittent P-waves w/ sinus brady 40's alternating w/ junctional bradycardia in the 40's BP's 170/ 80 range No rash, cyanosis or gangrene Sclera anicteric, throat clear   No jvd or bruits Chest clear bilat to bases RRR no MRG Abd soft ntnd no mass or ascites +bs GU defer MS no joint effusions or deformity Ext 1-2+ bilat LE edema, no wounds or ulcers Neuro is alert, Ox 3 , nf R IJ TDC clean exit    Home meds:  - midodrine 10mg  at 4am and 8am on HD days  - ferric citrate ac tid/ calc acetate tid ac/ cinacalcet 90 hs  - gabapentin 100 hs/ sertraline 100 qd  - prn's/ vitamins/ supplements  - levothyroxine 25 ug    Outpt HD: [facility]  TTS   3h 92min  P2  R IJ TDC  Hep 4000    - needs  meds updated     Assessment/ Plan: 1. Hyperkalemia - severe w/ intermittent sinus brady/ junctional brady. Has been given IV Ca/ ins /glucose, we will plan on HD asap upstairs to get K+ down. Will need  overnight admit/ observation.  2. Bradycardia - the July 2020 ekg is also bradycardic but her K+ then was also > 7.0.  This is acute bradycardia related to #1.  Her Dec 2012 ekg showed NSR in the 60's.   3. Depression 4. ESRD - on HD TTS schedule.  HD today as above.  5. Volume - prob a little volume up, UF 3-4 L today as tolerated.       Kelly Splinter  MD 02/11/2019, 11:22 AM

## 2019-02-11 NOTE — ED Triage Notes (Addendum)
Pt here from home via [facility] for weakness after not having dialysis since Mon.  States R calf and foot are cramping and are numb, making it difficult to walk d/t the pain.  She states she has fallen times during the night because her legs gave out.  Denies loc.  Pt ao x 4.  Neuro intact.

## 2019-02-11 NOTE — ED Notes (Signed)
Repeat EKG performed and exported to chart per Nephrologist request

## 2019-02-11 NOTE — ED Provider Notes (Signed)
[facility] EMERGENCY DEPARTMENT Provider Note   CSN: [id number] Arrival date & time: 02/11/19  0725     History   Chief Complaint Chief Complaint  Patient presents with  . Weakness    last dialysis Mon    HPI Mary Wilcox is a 63 y.o. female.     HPI Presents with concern of weakness, nausea, and bilateral lower extremity discomfort, right greater than left. Patient has multiple medical issues, including end-stage renal disease. She notes that she missed her dialysis session 3 days ago, after oversleeping, but was able to have her session 2 days prior to that. Over the past 2 days or so patient has developed weakness, primarily in the lower extremities, with sense of impending fall.  On there is associated pain in the right anterior quadriceps region, without loss of sensation or function distally. No fall, or other clear precipitant for her pain, weakness. No fever, chest pain, vomiting. She states that she is otherwise taking her medication/therapy as directed. On arrival to the ED evaluation room the patient slid from her wheelchair, did not sustain additional substantial trauma, denies any new complaints after that episode. Past Medical History:  Diagnosis Date  . Bilateral hydronephrosis 2006   due to obstruction from cervical cancer  . Cervical cancer ([place]) 2006   IIIB s/p ext radiation and intracavitary cessium  . Chronic kidney disease    hx several episodes of ARF secondary to obstructive uropathy  . Insomnia    takes Ambien 3x wk  . Iron deficiency anemia   . Peripheral vascular disease ([place])   . Secondary hyperparathyroidism (of renal origin)   . Transfusion history    02/2011 for Hgb 6.8    Patient Active Problem List   Diagnosis Date Noted  . Hyperkalemia 09/28/2018  . Bradycardia   . Mechanical complication of other vascular device, implant, and graft 08/19/2011  . ESRD (end stage renal disease) ([place]) 08/05/2011  . Other  complications due to renal dialysis device, implant, and graft 08/05/2011  . Secondary hyperparathyroidism (of renal origin)   . Anemia associated with chronic renal failure 03/06/2011  . Thrombocytosis ([place]) 03/06/2011  . Chronic kidney disease, stage V requiring chronic dialysis ([place]) 03/05/2011  . High anion gap metabolic acidosis 70/26/3785  . Hydronephrosis, bilateral 03/05/2011  . Chronic UTI 03/05/2011  . Leukocytosis 03/05/2011    Past Surgical History:  Procedure Laterality Date  . AV FISTULA PLACEMENT  03/05/2011   Procedure: INSERTION OF ARTERIOVENOUS (AV) GORE-TEX GRAFT ARM;  Surgeon: Angelia Mould, MD;  Location: [place];  Service: Vascular;  Laterality: Left;  start 1115   finish 1250  . INSERTION OF DIALYSIS CATHETER  03/05/2011   Procedure: INSERTION OF DIALYSIS CATHETER;  Surgeon: Angelia Mould, MD;  Location: [place];  Service: Vascular;  Laterality: Right;  start 1041- finish 1051  . TUBAL LIGATION    . URETER SURGERY     stent, Dr. Risa Grill for bilateral hydro     OB History   No obstetric history on file.      Home Medications    Prior to Admission medications   Medication Sig Start Date End Date Taking? Authorizing Provider  acetaminophen (TYLENOL) 500 MG tablet Take 1,000 mg by mouth every 6 (six) hours as needed for headache (pain).    [provider]  B Complex-C (SUPER B COMPLEX PO) Take 1 tablet by mouth daily.    [provider]  calcium acetate (PHOSLO) 667 MG  capsule Take 667 mg by mouth See admin instructions. Take one capsule (667 mg) by mouth three times daily with meals and 2-3 times with snacks. 06/18/11   [provider]  cephALEXin (KEFLEX) 500 MG capsule Take 1 capsule (500 mg total) by mouth daily. 10/01/18   Aline August, MD  cinacalcet (SENSIPAR) 90 MG tablet Take 90 mg by mouth at bedtime.    [provider]  ferric citrate (AURYXIA) 1 GM 210 MG(Fe) tablet Take 210-420 mg by mouth See admin  instructions. Take two tablets (420 mg) by mouth three times daily with meals and one tablet (210 mg) up to 3 times daily with snacks    [provider]  gabapentin (NEURONTIN) 100 MG capsule Take 100 mg by mouth at bedtime. For leg pain 08/06/18   [provider]  levothyroxine (SYNTHROID) 25 MCG tablet Take 25 mcg by mouth daily before breakfast. 08/10/18   [provider]  midodrine (PROAMATINE) 10 MG tablet Take 10 mg by mouth See admin instructions. Take one tablet (10 mg) by mouth twice on dialysis days (Tuesday, Thursday, Saturday) at 4am and 8am 09/11/18   [provider]  sertraline (ZOLOFT) 100 MG tablet Take 100 mg by mouth daily. 09/17/18   [provider]  vitamin B-12 (CYANOCOBALAMIN) 1000 MCG tablet Take 1,000 mcg by mouth daily.    [provider]  zolpidem (AMBIEN) 10 MG tablet Take 10 mg by mouth at bedtime as needed for sleep.     [provider]    Family History Family History  Problem Relation Age of Onset  . Lung cancer Father   . Cancer Father   . Cancer Mother        kidney and thyroid  . Heart disease Brother   . Diabetes Brother   . Hyperlipidemia Brother   . Hypertension Brother   . Other Brother        varicose vein    Social History Social History   Tobacco Use  . Smoking status: Never Smoker  . Smokeless tobacco: Never Used  Substance Use Topics  . Alcohol use: No  . Drug use: No     Allergies   Prozac [fluoxetine hcl]   Review of Systems Review of Systems  Constitutional:       Per HPI, otherwise negative  HENT:       Per HPI, otherwise negative  Respiratory:       Per HPI, otherwise negative  Cardiovascular:       Per HPI, otherwise negative  Gastrointestinal: Negative for vomiting.  Endocrine:       Negative aside from HPI  Genitourinary:       Neg aside from HPI   Musculoskeletal:       Per HPI, otherwise negative  Skin: Negative.   Allergic/Immunologic: Positive for  immunocompromised state.  Neurological: Positive for weakness. Negative for syncope.     Physical Exam Updated Vital Signs BP (!) 157/63   Pulse (!) 49   Temp 98.5 F (36.9 C) (Oral)   Resp 17   Ht 5' (1.524 m)   Wt 85 kg   SpO2 100%   BMI 36.60 kg/m   Physical Exam Vitals signs and nursing note reviewed.  Constitutional:      General: She is not in acute distress.    Appearance: She is well-developed.  HENT:     Head: Normocephalic and atraumatic.  Eyes:     Conjunctiva/sclera: Conjunctivae normal.  Cardiovascular:  Rate and Rhythm: Normal rate and regular rhythm.  Pulmonary:     Effort: Pulmonary effort is normal. No respiratory distress.     Breath sounds: Normal breath sounds. No stridor.  Chest:    Abdominal:     General: There is no distension.  Musculoskeletal:     Comments: No deformities, no no tenderness to palpation in the pelvis, no instability. No deformities in either lower extremity either, without substantial edema. However, the patient describes pain in her anterior quadriceps region with right leg motion, flexion and extension of the knee. However, patient is able to flex each hip, knee, ankle spontaneously, with appropriate strength.  Skin:    General: Skin is warm and dry.  Neurological:     Mental Status: She is alert and oriented to person, place, and time.     Cranial Nerves: No cranial nerve deficit.      ED Treatments / Results  Labs (all labs ordered are listed, but only abnormal results are displayed) Labs Reviewed  BASIC METABOLIC PANEL - Abnormal; Notable for the following components:      Result Value   Sodium 134 (*)    Potassium >7.5 (*)    Chloride 91 (*)    CO2 20 (*)    BUN 112 (*)    Creatinine, Ser 15.68 (*)    Calcium 8.2 (*)    GFR calc non Af Amer 2 (*)    GFR calc Af Amer 2 (*)    Anion gap 23 (*)    All other components within normal limits  CBC - Abnormal; Notable for the following components:   WBC  11.2 (*)    MCV 103.5 (*)    All other components within normal limits  SARS CORONAVIRUS 2 BY RT PCR (HOSPITAL ORDER, [facility])  URINALYSIS, ROUTINE W REFLEX MICROSCOPIC  CBG MONITORING, ED    EKG EKG Interpretation  Date/Time:  Saturday February 11 2019 07:37:51 EST Ventricular Rate:  43 PR Interval:    QRS Duration: 108 QT Interval:  464 QTC Calculation: 392 R Axis:   -51 Text Interpretation: Junctional bradycardia  possible retrograde conduction Left axis deviation Baseline wander Abnormal ECG Confirmed by Carmin Muskrat [phone]) on 02/11/2019 9:46:45 AM   Radiology No results found.  Procedures Procedures (including critical care time)  CRITICAL CARE Performed by: Carmin Muskrat Total critical care time: 40 minutes Critical care time was exclusive of separately billable procedures and treating other patients. Critical care was necessary to treat or prevent imminent or life-threatening deterioration. Critical care was time spent personally by me on the following activities: development of treatment plan with patient and/or surrogate as well as nursing, discussions with consultants, evaluation of patient's response to treatment, examination of patient, obtaining history from patient or surrogate, ordering and performing treatments and interventions, ordering and review of laboratory studies, ordering and review of radiographic studies, pulse oximetry and re-evaluation of patient's condition.   Medications Ordered in ED Medications  sodium chloride flush (NS) 0.9 % injection 3 mL (has no administration in time range)  calcium gluconate inj 10% (1 g) URGENT USE ONLY! (has no administration in time range)  albuterol (PROVENTIL) (2.5 MG/3ML) 0.083% nebulizer solution 10 mg (has no administration in time range)  insulin aspart (novoLOG) injection 5 Units (has no administration in time range)    And  dextrose 50 % solution 50 mL (has no  administration in time range)  sodium bicarbonate injection 50 mEq (has no administration  in time range)     Initial Impression / Assessment and Plan / ED Course  I have reviewed the triage vital signs and the nursing notes.  Pertinent labs & imaging results that were available during my care of the patient were reviewed by me and considered in my medical decision making (see chart for details).    Initial labs notable for a critically abnormal potassium value.  Given this finding, patient's missed dialysis, the patient has had rapid Covid test sent, will receive hyperkalemia meds, including insulin, dextrose, bicarbonate, albuterol.    This elderly female on dialysis, now having missed a session presents with weakness. Patient is awake and alert, has mild bradycardia, which is not altogether unusual for her. Patient has no chest pain, no dyspnea, but is found a critically abnormal potassium value, requiring emergent dialysis.  I have discussed the patient's case with our dialysis colleagues, and with internal medicine for admission given the conduction abnormalities on EKG.  Final Clinical Impressions(s) / ED Diagnoses   Final diagnoses:  Weakness  Hyperkalemia  Bradycardia     Carmin Muskrat, MD 02/11/19 1008

## 2019-02-11 NOTE — ED Notes (Signed)
Pt noted to be more consistently bradycardic; Dr. Vanita Panda notified; pt placed on zoll pads

## 2019-02-11 NOTE — H&P (Signed)
History and Physical    Mary Wilcox QPY:[id number] DOB: 11-Feb-1956 DOA: 02/11/2019  Referring MD/NP/PA: Carmin Muskrat, MD PCP: Kristen Loader, FNP  Patient coming from: Home  Chief Complaint: Weakness  I have personally briefly reviewed patient's old medical records in [place]   HPI: Mary Wilcox is a 63 y.o. female with medical history significant of ESRD on HD(TTS), secondary hyperparathyroidism, iron deficiency anemia requiring previous transfusion, and history of cervical cancer.  She presents with complaints of weakness over the last 2 days.  At baseline patient reports being able to ambulate without need of assistance.  Last had hemodialysis on 11/21.  She reports that she overslept and missed her ride to hemodialysis.  She complains of having discomfort in both of her lower legs.  Denies having any recent falls, headache, change in vision, change in speech, or any other focal weakness to her knowledge.  ED Course: Upon admission to the emergency department patient was noted to be afebrile, pulse 38-74, respirations 16-21, blood pressure 137/63-170/80, and O2 saturations maintained on room air.  While in the ED patient had slipped from wheelchair, but did not sustain any substantial trauma.  Labs significant for WBC 11.2, sodium 134, potassium> 7.5, CO2 20, BUN 112, creatinine 15.68, and anion gap 23.  COVID-19 screening was negative.  Patient has been given 1g calcium gluconate, an amp of dextrose 50% solution, 5 units of insulin, and an amp of sodium bicarbonate.  Nephrology have been formally consulted with plans on dialyzing the patient.  TRH called to admit.  Review of systems: A complete 10 point review of systems was performed and negative except for as noted above in the HPI.  Past Medical History:  Diagnosis Date  . Bilateral hydronephrosis 2006   due to obstruction from cervical cancer  . Cervical cancer ([place]) 2006   IIIB s/p ext radiation and  intracavitary cessium  . Chronic kidney disease    hx several episodes of ARF secondary to obstructive uropathy  . Insomnia    takes Ambien 3x wk  . Iron deficiency anemia   . Peripheral vascular disease ([place])   . Secondary hyperparathyroidism (of renal origin)   . Transfusion history    02/2011 for Hgb 6.8    Past Surgical History:  Procedure Laterality Date  . AV FISTULA PLACEMENT  03/05/2011   Procedure: INSERTION OF ARTERIOVENOUS (AV) GORE-TEX GRAFT ARM;  Surgeon: Angelia Mould, MD;  Location: [facility];  Service: Vascular;  Laterality: Left;  start 1115   finish 1250  . INSERTION OF DIALYSIS CATHETER  03/05/2011   Procedure: INSERTION OF DIALYSIS CATHETER;  Surgeon: Angelia Mould, MD;  Location: [facility];  Service: Vascular;  Laterality: Right;  start 1041- finish 1051  . TUBAL LIGATION    . URETER SURGERY     stent, Dr. Risa Grill for bilateral hydro     reports that she has never smoked. She has never used smokeless tobacco. She reports that she does not drink alcohol or use drugs.  Allergies  Allergen Reactions  . Prozac [Fluoxetine Hcl] Hives    Family History  Problem Relation Age of Onset  . Lung cancer Father   . Cancer Father   . Cancer Mother        kidney and thyroid  . Heart disease Brother   . Diabetes Brother   . Hyperlipidemia Brother   . Hypertension Brother   . Other Brother        varicose vein  Prior to Admission medications   Medication Sig Start Date End Date Taking? Authorizing Provider  acetaminophen (TYLENOL) 500 MG tablet Take 1,000 mg by mouth every 6 (six) hours as needed for headache (pain).    [provider]  B Complex-C (SUPER B COMPLEX PO) Take 1 tablet by mouth daily.    [provider]  calcium acetate (PHOSLO) 667 MG capsule Take 667 mg by mouth See admin instructions. Take one capsule (667 mg) by mouth three times daily with meals and 2-3 times with snacks. 06/18/11   [provider]  cephALEXin  (KEFLEX) 500 MG capsule Take 1 capsule (500 mg total) by mouth daily. 10/01/18   Aline August, MD  cinacalcet (SENSIPAR) 90 MG tablet Take 90 mg by mouth at bedtime.    [provider]  ferric citrate (AURYXIA) 1 GM 210 MG(Fe) tablet Take 210-420 mg by mouth See admin instructions. Take two tablets (420 mg) by mouth three times daily with meals and one tablet (210 mg) up to 3 times daily with snacks    [provider]  gabapentin (NEURONTIN) 100 MG capsule Take 100 mg by mouth at bedtime. For leg pain 08/06/18   [provider]  levothyroxine (SYNTHROID) 25 MCG tablet Take 25 mcg by mouth daily before breakfast. 08/10/18   [provider]  midodrine (PROAMATINE) 10 MG tablet Take 10 mg by mouth See admin instructions. Take one tablet (10 mg) by mouth twice on dialysis days (Tuesday, Thursday, Saturday) at 4am and 8am 09/11/18   [provider]  sertraline (ZOLOFT) 100 MG tablet Take 100 mg by mouth daily. 09/17/18   [provider]  vitamin B-12 (CYANOCOBALAMIN) 1000 MCG tablet Take 1,000 mcg by mouth daily.    [provider]  zolpidem (AMBIEN) 10 MG tablet Take 10 mg by mouth at bedtime as needed for sleep.     [provider]    Physical Exam:  Constitutional: Elderly female in NAD, calm, comfortable Vitals:   02/11/19 0945 02/11/19 1045 02/11/19 1100 02/11/19 1115  BP: (!) 170/80 (!) 160/79 (!) 160/40   Pulse: 64 66  (!) 38  Resp: 20 20 (!) 21 18  Temp:      TempSrc:      SpO2: 100% 100% 98% 100%  Weight:      Height:       Eyes: PERRL, lids and conjunctivae normal ENMT: Mucous membranes are moist. Posterior pharynx clear of any exudate or lesions.   Neck: normal, supple, no masses, no thyromegaly.  No JVD. Respiratory: clear to auscultation bilaterally, no wheezing, no crackles. Normal respiratory effort. No accessory muscle use.  Cardiovascular: Bradycardic, no murmurs / rubs / gallops.  1+ pitting bilateral  extremity edema. 2+ pedal pulses. No carotid bruits.  Abdomen: no tenderness, no masses palpated. No hepatosplenomegaly. Bowel sounds positive.  Musculoskeletal: no clubbing / cyanosis. No joint deformity upper and lower extremities. Good ROM, no contractures. Normal muscle tone.  Skin: no rashes, lesions, ulcers. No induration Neurologic: CN 2-12 grossly intact.  Patient able to move all extremities Psychiatric: Normal judgment and insight. Alert and oriented x 3. Normal mood.     Labs on Admission: I have personally reviewed following labs and imaging studies  CBC: Recent Labs  Lab 02/11/19 0815  WBC 11.2*  HGB 14.2  HCT 44.0  MCV 103.5*  PLT 638   Basic Metabolic Panel: Recent Labs  Lab 02/11/19 0815  NA 134*  K >7.5*  CL 91*  CO2  20*  GLUCOSE 89  BUN 112*  CREATININE 15.68*  CALCIUM 8.2*   GFR: Estimated Creatinine Clearance: 3.5 mL/min (A) (by C-G formula based on SCr of 15.68 mg/dL (H)). Liver Function Tests: No results for input(s): AST, ALT, ALKPHOS, BILITOT, PROT, ALBUMIN in the last 168 hours. No results for input(s): LIPASE, AMYLASE in the last 168 hours. No results for input(s): AMMONIA in the last 168 hours. Coagulation Profile: No results for input(s): INR, PROTIME in the last 168 hours. Cardiac Enzymes: No results for input(s): CKTOTAL, CKMB, CKMBINDEX, TROPONINI in the last 168 hours. BNP (last 3 results) No results for input(s): PROBNP in the last 8760 hours. HbA1C: No results for input(s): HGBA1C in the last 72 hours. CBG: Recent Labs  Lab 02/11/19 0811  GLUCAP 87   Lipid Profile: No results for input(s): CHOL, HDL, LDLCALC, TRIG, CHOLHDL, LDLDIRECT in the last 72 hours. Thyroid Function Tests: No results for input(s): TSH, T4TOTAL, FREET4, T3FREE, THYROIDAB in the last 72 hours. Anemia Panel: No results for input(s): VITAMINB12, FOLATE, FERRITIN, TIBC, IRON, RETICCTPCT in the last 72 hours. Urine analysis:    Component Value Date/Time    COLORURINE YELLOW 04/12/2011 0335   APPEARANCEUR TURBID (A) 04/12/2011 0335   LABSPEC 1.008 04/12/2011 0335   PHURINE 8.5 (H) 04/12/2011 0335   GLUCOSEU NEGATIVE 04/12/2011 0335   HGBUR LARGE (A) 04/12/2011 0335   BILIRUBINUR NEGATIVE 04/12/2011 0335   KETONESUR NEGATIVE 04/12/2011 0335   PROTEINUR 100 (A) 04/12/2011 0335   UROBILINOGEN 0.2 04/12/2011 0335   NITRITE NEGATIVE 04/12/2011 0335   LEUKOCYTESUR LARGE (A) 04/12/2011 0335   Sepsis Labs: No results found for this or any previous visit (from the past 240 hour(s)).   Radiological Exams on Admission: No results found.  EKG: Independently reviewed.  Irregular rhythm at 53 bpm Assessment/Plan Hyperkalemia: Acute.  Potassium elevated greater than 7.5 on admission.  EKG concerning for changes and conduction abnormality.  Patient was given calcium gluconate, dextrose, insulin, and -Admit to a progressive bed -Recheck BMP after hemodialysis -Follow-up telemetry overnight  ESRD on HD, secondary hyperparathyroidism: Patient presents after recently missing hemodialysis with complaints of weakness.  Labs significant for potassium seen above, CO2 20, BUN  112, creatinine 15.68, and anion gap 23. -Continue current home regimen -HD Per nephrology  Leukocytosis: WBC elevated 11.2.  Patient denies any reports of fever, nausea, vomiting, diarrhea, or shortness of breath.  COVID-19 screening was negative. -Recheck CBC in a.m.  Metabolic acidosis with elevated anion gap -Continue to monitor  Generalized weakness: Patient presents with reports of generalized weakness lower extremities unable to ambulate.  At baseline patient able to ambulate. -May warrant consulting physical therapy in a.m. if symptoms not improved after dialysis  Bradycardia: Patient noted to have irregular rhythm at 83 bpm.  Suspect secondary to electrolyte derangements. -Follow-up telemetry overnight  Depression -Continue Zoloft  Hyponatremia: Acute.  Sodium 134  on admission. -Recheck in a.m.  Hypothyroidism -Add on TSH -Continue levothyroxine   Obesity: BMI 35.31 kg/m  DVT prophylaxis: Heparin Code Status: Full Family Communication: No family present at bed Disposition Plan: Possible discharge home in 2 to 3 days. Consults called: Nephrology Admission status: Inpatient  Norval Morton MD [facility] Pager [phone]   If 7PM-7AM, please contact night-coverage [email or web address] Password [facility]  02/11/2019, 12:02 PM

## 2019-02-12 LAB — RENAL FUNCTION PANEL
Albumin: 3.2 g/dL — ABNORMAL LOW (ref 3.5–5.0)
Anion gap: 19 — ABNORMAL HIGH (ref 5–15)
BUN: 42 mg/dL — ABNORMAL HIGH (ref 8–23)
CO2: 26 mmol/L (ref 22–32)
Calcium: 8.1 mg/dL — ABNORMAL LOW (ref 8.9–10.3)
Chloride: 93 mmol/L — ABNORMAL LOW (ref 98–111)
Creatinine, Ser: 9.11 mg/dL — ABNORMAL HIGH (ref 0.44–1.00)
GFR calc Af Amer: 5 mL/min — ABNORMAL LOW (ref >60)
GFR calc non Af Amer: 4 mL/min — ABNORMAL LOW (ref >60)
Glucose, Bld: 78 mg/dL (ref 70–99)
Phosphorus: 6.8 mg/dL — ABNORMAL HIGH (ref 2.5–4.6)
Potassium: 5.4 mmol/L — ABNORMAL HIGH (ref 3.5–5.1)
Sodium: 138 mmol/L (ref 135–145)

## 2019-02-12 LAB — CBC
HCT: 34.5 % — ABNORMAL LOW (ref 36.0–46.0)
Hemoglobin: 11.5 g/dL — ABNORMAL LOW (ref 12.0–15.0)
MCH: 33.8 pg (ref 26.0–34.0)
MCHC: 33.3 g/dL (ref 30.0–36.0)
MCV: 101.5 fL — ABNORMAL HIGH (ref 80.0–100.0)
Platelets: 222 10*3/uL (ref 150–400)
RBC: 3.4 MIL/uL — ABNORMAL LOW (ref 3.87–5.11)
RDW: 14.4 % (ref 11.5–15.5)
WBC: 6.3 10*3/uL (ref 4.0–10.5)
nRBC: 0 % (ref 0.0–0.2)

## 2019-02-12 LAB — TSH: TSH: 1.585 u[IU]/mL (ref 0.350–4.500)

## 2019-02-12 MED ORDER — CHLORHEXIDINE GLUCONATE CLOTH 2 % EX PADS
6.0000 | MEDICATED_PAD | Freq: Every day | CUTANEOUS | Status: DC
  Administered 2019-02-13 – 2019-02-14 (×2): 6 via TOPICAL

## 2019-02-12 MED ORDER — SODIUM ZIRCONIUM CYCLOSILICATE 10 G PO PACK
10.0000 g | PACK | Freq: Three times a day (TID) | ORAL | Status: AC
  Administered 2019-02-12 (×2): 10 g via ORAL
  Filled 2019-02-12 (×2): qty 1

## 2019-02-12 NOTE — Progress Notes (Signed)
[facility] Progress Note  Subjective: on c/o, feeling fine, K 5.4  Vitals:   02/11/19 1716 02/11/19 2000 02/12/19 0045 02/12/19 0400  BP: 114/72  (!) 143/66 (!) 146/74  Pulse:   65   Resp: (!) 21  16 16   Temp: 98.4 F (36.9 C) 98.7 F (37.1 C) 98.8 F (37.1 C) 98.4 F (36.9 C)  TempSrc:  Oral Oral Oral  SpO2:  96% 96%   Weight:      Height:        Inpatient medications: . albuterol  10 mg Nebulization Once  . calcium acetate  667 mg Oral TID WC  . Chlorhexidine Gluconate Cloth  6 each Topical Q0600  . cinacalcet  90 mg Oral Q supper  . ferric citrate  420 mg Oral TID WC  . gabapentin  100 mg Oral QHS  . heparin  5,000 Units Subcutaneous Q8H  . levothyroxine  25 mcg Oral QAC breakfast  . [START ON 02/14/2019] midodrine  10 mg Oral 2 times per day on Tue Thu Sat  . sertraline  100 mg Oral Daily  . sodium zirconium cyclosilicate  10 g Oral TID    acetaminophen **OR** acetaminophen, albuterol, calcium acetate, ferric citrate, zolpidem    Exam: Gen alert, seen in room , no distress Sclera anicteric, throat clear   No jvd or bruits Chest clear bilat to bases RRR no MRG Abd soft ntnd no mass or ascites +bs GU defer MS no joint effusions or deformity Ext min LE edema Neuro is alert, Ox 3 , nf R IJ TDC clean exit    Home meds:  - midodrine 10mg  at 4am and 8am on HD days  - ferric citrate ac tid/ calc acetate tid ac/ cinacalcet 90 hs  - gabapentin 100 hs/ sertraline 100 qd  - prn's/ vitamins/ supplements  - levothyroxine 25 ug    Outpt HD: [facility]  TTS   3h 71min  P2  R IJ TDC  Hep 4000   84.5kg   - needs meds updated     Assessment/ Plan: 1. Hyperkalemia - severe w/ intermittent sinus brady/ junctional brady. Went over and the high K food groups and only item is cantaloupe which she didn't know was high in K.  K 5.4 today. Doesn't have transport for OP extra HD tomorrow, will keep for am HD tomorrow and then plan to dc home and  will go to outpt HD Tuesday.  2. Bradycardia - the July 2020 ekg is also bradycardic but her K+ then was also > 7.0.  This is acute bradycardia related to #1.  Her Dec 2012 ekg showed NSR in the 60's.   3. Depression 4. ESRD - on HD TTS schedule.  HD today as above.  5. Volume - under dry wt a bit, lower at [employer] 02/12/2019, 2:11 PM  Iron/TIBC/Ferritin/ %Sat    Component Value Date/Time   IRON 73 03/05/2011 0109   TIBC 189 (L) 03/05/2011 0109   FERRITIN 1188 (H) 03/05/2011 0109   IRONPCTSAT 39 03/05/2011 0109   Recent Labs  Lab 02/12/19 0427  NA 138  K 5.4*  CL 93*  CO2 26  GLUCOSE 78  BUN 42*  CREATININE 9.11*  CALCIUM 8.1*  PHOS 6.8*  ALBUMIN 3.2*   No results for input(s): AST, ALT, ALKPHOS, BILITOT, PROT in the last 168 hours. Recent Labs  Lab 02/12/19 0427  WBC 6.3  HGB 11.5*  HCT 34.5*  PLT 222

## 2019-02-12 NOTE — Evaluation (Signed)
Physical Therapy Evaluation Patient Details Name: Mary Wilcox MRN: [id number] DOB: 1955-04-13 Today's Date: 02/12/2019   History of Present Illness   Mary Wilcox is a 63 y.o. female with medical history significant of ESRD on HD(TTS), secondary hyperparathyroidism, iron deficiency anemia requiring previous transfusion, and history of cervical cancer.  She presents with complaints of weakness over the last 2 days. Pt had missed most recent HD session. Had a fall out of w/c in ED without sig trauma.   Clinical Impression  Pt admitted with above diagnosis. Pt much improved since admission. On eval, RLE weaker than L (she reports this is from peripheral neuropathy) but L knee sore from chronic OA as well as going down on it in ED. No strength loss, mild point tenderness superior knee. No assist needed for bed mobility or transfers, min-guard A for ambulation without AD. Recommend cane for more stability to compensate for RLE weakness. Discussed eventual need for pt to move to ground level apt as she currently has a whole flight to get to her unit. Will follow acutely to practice stairs but do not anticipate her needing PT at d/c.  Pt currently with functional limitations due to the deficits listed below (see PT Problem List). Pt will benefit from skilled PT to increase their independence and safety with mobility to allow discharge to the venue listed below.       Follow Up Recommendations No PT follow up    Equipment Recommendations  Cane    Recommendations for Other Services       Precautions / Restrictions Precautions Precautions: Fall Precaution Comments: fell in ED Restrictions Weight Bearing Restrictions: No      Mobility  Bed Mobility Overal bed mobility: Modified Independent             General bed mobility comments: no difficulty getting to EOB  Transfers Overall transfer level: Modified independent Equipment used: None             General transfer  comment: steady standing with no AD  Ambulation/Gait Ambulation/Gait assistance: Min guard Gait Distance (Feet): 30 Feet Assistive device: None Gait Pattern/deviations: Wide base of support Gait velocity: decreased Gait velocity interpretation: 1.31 - 2.62 ft/sec, indicative of limited community ambulator General Gait Details: tends to "furniture walk" using foot of bed, counter, etc. No gross weakness or instability noted  Stairs            Wheelchair Mobility    Modified Rankin (Stroke Patients Only)       Balance Overall balance assessment: Mild deficits observed, not formally tested                                           Pertinent Vitals/Pain Pain Assessment: Faces Faces Pain Scale: Hurts little more Pain Location: L knee from falling, mildly bruised and point tender superior knee Pain Descriptors / Indicators: Aching Pain Intervention(s): Repositioned;Limited activity within patient's tolerance    Home Living Family/patient expects to be discharged to:: Private residence Living Arrangements: Alone Available Help at Discharge: Neighbor;Available PRN/intermittently Type of Home: Apartment Home Access: Stairs to enter Entrance Stairs-Rails: Right Entrance Stairs-Number of Steps: flight Home Layout: One level Home Equipment: Walker - 2 wheels;Shower seat Additional Comments: pt reports that she and her neighbor check on each other every other day    Prior Function Level of Independence: Independent  Comments: Uses [employer] for transportation. Does her own cooking, cleaning, and has been using online grocery delivery     Hand Dominance        Extremity/Trunk Assessment   Upper Extremity Assessment Upper Extremity Assessment: Overall WFL for tasks assessed    Lower Extremity Assessment Lower Extremity Assessment: RLE deficits/detail;LLE deficits/detail RLE Deficits / Details: RLE weaker than L, hip flex 3+/5, knee ext 3+/5,  knee flex 3+/5 RLE Sensation: history of peripheral neuropathy RLE Coordination: WNL LLE Deficits / Details: grossly 4/5 but has L knee OA so oftentimes painful LLE Sensation: WNL LLE Coordination: WNL    Cervical / Trunk Assessment Cervical / Trunk Assessment: Normal  Communication   Communication: No difficulties  Cognition Arousal/Alertness: Awake/alert Behavior During Therapy: WFL for tasks assessed/performed Overall Cognitive Status: Within Functional Limits for tasks assessed                                        General Comments General comments (skin integrity, edema, etc.): discussed life alert button or enabling function such as "hey siri" on cell phone. Also discussed need for eventual move to ground level apt. Pt reports she is on waiting list but also admits that she likes her apt and really does not want to switch    Exercises     Assessment/Plan    PT Assessment Patient needs continued PT services  PT Problem List Decreased strength;Decreased activity tolerance;Decreased balance;Decreased mobility;Decreased knowledge of use of DME;Pain;Impaired sensation       PT Treatment Interventions DME instruction;Gait training;Stair training;Functional mobility training;Therapeutic activities;Therapeutic exercise;Balance training;Patient/family education    PT Goals (Current goals can be found in the Care Plan section)  Acute Rehab PT Goals Patient Stated Goal: go home PT Goal Formulation: With patient Time For Goal Achievement: 02/26/19 Potential to Achieve Goals: Good    Frequency Min 3X/week   Barriers to discharge Decreased caregiver support;Inaccessible home environment lives alone, 1 flight steps to enter apt    Co-evaluation               AM-PAC PT "6 Clicks" Mobility  Outcome Measure Help needed turning from your back to your side while in a flat bed without using bedrails?: None Help needed moving from lying on your back to  sitting on the side of a flat bed without using bedrails?: None Help needed moving to and from a bed to a chair (including a wheelchair)?: None Help needed standing up from a chair using your arms (e.g., wheelchair or bedside chair)?: None Help needed to walk in hospital room?: A Little Help needed climbing 3-5 steps with a railing? : A Little 6 Click Score: 22    End of Session Equipment Utilized During Treatment: Gait belt Activity Tolerance: Patient tolerated treatment well Patient left: in chair;with call bell/phone within reach Nurse Communication: Mobility status PT Visit Diagnosis: History of falling (Z91.81);Muscle weakness (generalized) (M62.81)    Time: 4034-7425 PT Time Calculation (min) (ACUTE ONLY): 26 min   Charges:   PT Evaluation $PT Eval Moderate Complexity: 1 Mod PT Treatments $Gait Training: 8-22 mins        Leighton Roach, [place]  Pager [phone] [place] 02/12/2019, 2:35 PM

## 2019-02-12 NOTE — Progress Notes (Signed)
PROGRESS NOTE    Mary Wilcox    Code Status: Full Code  YKZ:[id number] DOB: 1956-01-24 DOA: 02/11/2019  PCP: Kristen Loader, [facility] Summary  This is a 63 year old female with past medical history of ESRD on HD (TTS), secondary hyperparathyroidism, iron deficiency anemia requiring previous transfusions, cervical cancer who presented to the ED and admitted on 11/28 after missing an HD treatment with complaints of discomfort of the lower legs and generalized weakness found to have potassium greater than 7.5 in ED.  She was given calcium gluconate, dextrose 50% solution, 5 units of insulin and an amp of sodium bicarbonate and underwent emergent dialysis by nephrology on 11/28.  Had significant improvement in symptoms and potassium following day.  A & P   Principal Problem:   Hyperkalemia Active Problems:   High anion gap metabolic acidosis   Leukocytosis   ESRD (end stage renal disease) (HCC)   Bradycardia   Generalized weakness   Hyponatremia   Acute symptomatic hyperkalemia due to missed HD Potassium greater than 7.5 on admission with EKG changes initially concerning for bigeminy and peaked T waves in precordial leads on personal read.  Underwent HD on 11/28.  K>7.5->4.9->5.4 today.  EKG today normal sinus rhythm with no pathologic changes.  now asymptomatic -HD tomorrow and likely DC afterwards -Continue to monitor  ESRD on HD (TTS) Was on off schedule due to Thanksgiving holiday this past week (MWF).  Had HD Monday, overslept on Wednesday and missed her appointment which led to problem #1.  Does not have transport for outpatient HD tomorrow -HD in a.m.  Leukocytosis Suspect reactive to #1.  Resolved  Anion gap metabolic acidosis  related to above Anion gap 19. -Monitor  Bigeminy and bradycardia  secondary to electrolyte abnormalities Resolved -Continue telemetry  Depression -Continue Zoloft  Hyponatremia Resolved  Hypothyroidism Stable -Continue  home med   DVT prophylaxis: Heparin Diet: Renal Family Communication: No family at bedside Disposition Plan: Likely DC tomorrow after dialysis  Consultants  Nephrology  Procedures  HD 11/28    Subjective   Patient seen and examined at bedside in no acute distress and resting comfortably. No acute events overnight. Denies any acute complaints at this time. Ambulating. Tolerating diet well.   Objective   Vitals:   02/11/19 1716 02/11/19 2000 02/12/19 0045 02/12/19 0400  BP: 114/72  (!) 143/66 (!) 146/74  Pulse:   65   Resp: (!) 21  16 16   Temp: 98.4 F (36.9 C) 98.7 F (37.1 C) 98.8 F (37.1 C) 98.4 F (36.9 C)  TempSrc:  Oral Oral Oral  SpO2:  96% 96%   Weight:      Height:       No intake or output data in the 24 hours ending 02/12/19 1811 Filed Weights   02/11/19 0749 02/11/19 1231 02/11/19 1615  Weight: 85 kg 85.2 kg 82 kg    Examination:  Physical Exam Vitals signs and nursing note reviewed.  Constitutional:      Appearance: Normal appearance.  HENT:     Head: Normocephalic and atraumatic.     Nose: Nose normal.     Mouth/Throat:     Mouth: Mucous membranes are moist.  Eyes:     Extraocular Movements: Extraocular movements intact.  Neck:     Musculoskeletal: Normal range of motion. No neck rigidity.  Cardiovascular:     Rate and Rhythm: Normal rate and regular rhythm.  Pulmonary:     Effort: Pulmonary effort is  normal.     Breath sounds: Normal breath sounds.  Abdominal:     General: Abdomen is flat.     Palpations: Abdomen is soft.  Musculoskeletal: Normal range of motion.        General: No swelling.  Neurological:     General: No focal deficit present.     Mental Status: She is alert. Mental status is at baseline.  Psychiatric:        Mood and Affect: Mood normal.        Behavior: Behavior normal.     Data Reviewed: I have personally reviewed following labs and imaging studies  CBC: Recent Labs  Lab 02/11/19 0815 02/12/19 0427   WBC 11.2* 6.3  HGB 14.2 11.5*  HCT 44.0 34.5*  MCV 103.5* 101.5*  PLT 279 259   Basic Metabolic Panel: Recent Labs  Lab 02/11/19 0815 02/11/19 1810 02/12/19 0427  NA 134* 138 138  K >7.5* 4.9 5.4*  CL 91* 95* 93*  CO2 20* 25 26  GLUCOSE 89 81 78  BUN 112* 32* 42*  CREATININE 15.68* 7.53* 9.11*  CALCIUM 8.2* 8.5* 8.1*  PHOS  --   --  6.8*   GFR: Estimated Creatinine Clearance: 5.8 mL/min (A) (by C-G formula based on SCr of 9.11 mg/dL (H)). Liver Function Tests: Recent Labs  Lab 02/12/19 0427  ALBUMIN 3.2*   No results for input(s): LIPASE, AMYLASE in the last 168 hours. No results for input(s): AMMONIA in the last 168 hours. Coagulation Profile: No results for input(s): INR, PROTIME in the last 168 hours. Cardiac Enzymes: No results for input(s): CKTOTAL, CKMB, CKMBINDEX, TROPONINI in the last 168 hours. BNP (last 3 results) No results for input(s): PROBNP in the last 8760 hours. HbA1C: No results for input(s): HGBA1C in the last 72 hours. CBG: Recent Labs  Lab 02/11/19 0811  GLUCAP 87   Lipid Profile: No results for input(s): CHOL, HDL, LDLCALC, TRIG, CHOLHDL, LDLDIRECT in the last 72 hours. Thyroid Function Tests: Recent Labs    02/12/19 0427  TSH 1.585   Anemia Panel: No results for input(s): VITAMINB12, FOLATE, FERRITIN, TIBC, IRON, RETICCTPCT in the last 72 hours. Sepsis Labs: No results for input(s): PROCALCITON, LATICACIDVEN in the last 168 hours.  Recent Results (from the past 240 hour(s))  SARS Coronavirus 2 by RT PCR (hospital order, performed in [facility] hospital lab) Nasopharyngeal Nasopharyngeal Swab     Status: None   Collection Time: 02/11/19 10:56 AM   Specimen: Nasopharyngeal Swab  Result Value Ref Range Status   SARS Coronavirus 2 NEGATIVE NEGATIVE Final    Comment: (NOTE) SARS-CoV-2 target nucleic acids are NOT DETECTED. The SARS-CoV-2 RNA is generally detectable in upper and lower respiratory specimens during the acute phase  of infection. The lowest concentration of SARS-CoV-2 viral copies this assay can detect is 250 copies / mL. A negative result does not preclude SARS-CoV-2 infection and should not be used as the sole basis for treatment or other patient management decisions.  A negative result may occur with improper specimen collection / handling, submission of specimen other than nasopharyngeal swab, presence of viral mutation(s) within the areas targeted by this assay, and inadequate number of viral copies (<250 copies / mL). A negative result must be combined with clinical observations, patient history, and epidemiological information. Fact Sheet for Patients:   StrictlyIdeas.no Fact Sheet for Healthcare Providers: BankingDealers.co.za This test is not yet approved or cleared  by the [place] FDA and has been authorized for detection  and/or diagnosis of SARS-CoV-2 by FDA under an Emergency Use Authorization (EUA).  This EUA will remain in effect (meaning this test can be used) for the duration of the COVID-19 declaration under Section 564(b)(1) of the Act, 21 U.S.C. section 360bbb-3(b)(1), unless the authorization is terminated or revoked sooner. Performed at [facility], [place] [address], [place], [place] [address]          Radiology Studies: No results found.      Scheduled Meds: . albuterol  10 mg Nebulization Once  . calcium acetate  667 mg Oral TID WC  . Chlorhexidine Gluconate Cloth  6 each Topical Q0600  . [START ON 02/13/2019] Chlorhexidine Gluconate Cloth  6 each Topical Q0600  . cinacalcet  90 mg Oral Q supper  . ferric citrate  420 mg Oral TID WC  . gabapentin  100 mg Oral QHS  . heparin  5,000 Units Subcutaneous Q8H  . levothyroxine  25 mcg Oral QAC breakfast  . [START ON 02/14/2019] midodrine  10 mg Oral 2 times per day on Tue Thu Sat  . sertraline  100 mg Oral Daily   Continuous Infusions:   LOS: 1 day     Time spent: 25 minutes with over 50% of the time coordinating the patient's care    Harold Hedge, DO [facility] Pager [phone]  If 7PM-7AM, please contact night-coverage [email or web address] Password [facility] 02/12/2019, 6:11 PM

## 2019-02-13 LAB — MRSA PCR SCREENING: MRSA by PCR: NEGATIVE

## 2019-02-13 LAB — BASIC METABOLIC PANEL
Anion gap: 22 — ABNORMAL HIGH (ref 5–15)
Anion gap: 21 — ABNORMAL HIGH (ref 5–15)
BUN: 70 mg/dL — ABNORMAL HIGH (ref 8–23)
BUN: 70 mg/dL — ABNORMAL HIGH (ref 8–23)
CO2: 19 mmol/L — ABNORMAL LOW (ref 22–32)
CO2: 23 mmol/L (ref 22–32)
Calcium: 7.5 mg/dL — ABNORMAL LOW (ref 8.9–10.3)
Calcium: 8 mg/dL — ABNORMAL LOW (ref 8.9–10.3)
Chloride: 93 mmol/L — ABNORMAL LOW (ref 98–111)
Chloride: 96 mmol/L — ABNORMAL LOW (ref 98–111)
Creatinine, Ser: 10.88 mg/dL — ABNORMAL HIGH (ref 0.44–1.00)
Creatinine, Ser: 11.92 mg/dL — ABNORMAL HIGH (ref 0.44–1.00)
GFR calc Af Amer: 4 mL/min — ABNORMAL LOW (ref >60)
GFR calc Af Amer: 3 mL/min — ABNORMAL LOW (ref >60)
GFR calc non Af Amer: 3 mL/min — ABNORMAL LOW (ref >60)
GFR calc non Af Amer: 3 mL/min — ABNORMAL LOW (ref >60)
Glucose, Bld: 64 mg/dL — ABNORMAL LOW (ref 70–99)
Glucose, Bld: 76 mg/dL (ref 70–99)
Potassium: 6.3 mmol/L (ref 3.5–5.1)
Potassium: 4.8 mmol/L (ref 3.5–5.1)
Sodium: 134 mmol/L — ABNORMAL LOW (ref 135–145)
Sodium: 140 mmol/L (ref 135–145)

## 2019-02-13 LAB — CBC
HCT: 34.5 % — ABNORMAL LOW (ref 36.0–46.0)
Hemoglobin: 11.3 g/dL — ABNORMAL LOW (ref 12.0–15.0)
MCH: 33.5 pg (ref 26.0–34.0)
MCHC: 32.8 g/dL (ref 30.0–36.0)
MCV: 102.4 fL — ABNORMAL HIGH (ref 80.0–100.0)
Platelets: 222 10*3/uL (ref 150–400)
RBC: 3.37 MIL/uL — ABNORMAL LOW (ref 3.87–5.11)
RDW: 13.9 % (ref 11.5–15.5)
WBC: 6 10*3/uL (ref 4.0–10.5)
nRBC: 0 % (ref 0.0–0.2)

## 2019-02-13 LAB — MAGNESIUM: Magnesium: 2.6 mg/dL — ABNORMAL HIGH (ref 1.7–2.4)

## 2019-02-13 MED ORDER — SODIUM POLYSTYRENE SULFONATE 15 GM/60ML PO SUSP
45.0000 g | Freq: Once | ORAL | Status: AC
  Administered 2019-02-13: 45 g via ORAL
  Filled 2019-02-13: qty 180

## 2019-02-13 MED ORDER — HEPARIN SODIUM (PORCINE) 1000 UNIT/ML IJ SOLN
INTRAMUSCULAR | Status: AC
  Filled 2019-02-13: qty 7

## 2019-02-13 MED ORDER — HEPARIN SODIUM (PORCINE) 1000 UNIT/ML DIALYSIS: 3000.0000 [IU] | Freq: Once | INTRAMUSCULAR | Status: DC

## 2019-02-13 NOTE — Progress Notes (Signed)
  [facility] Progress Note   Subjective:  Seen in room - c/o ongoing diarrhea since taking kayexalate. No CP or dyspnea for now. Will bring her for dialysis today.  Objective Vitals:   02/12/19 0400 02/12/19 2143 02/12/19 2359 02/13/19 0421  BP: (!) 146/74 (!) 153/56 128/61 140/63  Pulse:  67 66 60  Resp: 16 16 16 16   Temp: 98.4 F (36.9 C) 98.8 F (37.1 C) 98.5 F (36.9 C) 98 F (36.7 C)  TempSrc: Oral Oral Oral Oral  SpO2:  96% 95% 98%  Weight:      Height:       Physical Exam General: Well appearing, NAD Heart: RRR; no murmur Lungs: CTA anteriorly Extremities: Trace LE edema Dialysis Access: [facility]  Additional Objective Labs: Basic Metabolic Panel: Recent Labs  Lab 02/11/19 1810 02/12/19 0427 02/13/19 0229  NA 138 138 134*  K 4.9 5.4* 6.3*  CL 95* 93* 93*  CO2 25 26 19*  GLUCOSE 81 78 64*  BUN 32* 42* 70*  CREATININE 7.53* 9.11* 10.88*  CALCIUM 8.5* 8.1* 7.5*  PHOS  --  6.8*  --    Liver Function Tests: Recent Labs  Lab 02/12/19 0427  ALBUMIN 3.2*   CBC: Recent Labs  Lab 02/11/19 0815 02/12/19 0427 02/13/19 0229  WBC 11.2* 6.3 6.0  HGB 14.2 11.5* 11.3*  HCT 44.0 34.5* 34.5*  MCV 103.5* 101.5* 102.4*  PLT 279 222 222   Blood Culture    Component Value Date/Time   SDES URINE, CATHETERIZED 04/12/2011 0335   SPECREQUEST NONE 04/12/2011 0335   CULT ESCHERICHIA COLI 04/12/2011 0335   REPTSTATUS 04/14/2011 FINAL 04/12/2011 0335   Medications:  . albuterol  10 mg Nebulization Once  . calcium acetate  667 mg Oral TID WC  . Chlorhexidine Gluconate Cloth  6 each Topical Q0600  . Chlorhexidine Gluconate Cloth  6 each Topical Q0600  . cinacalcet  90 mg Oral Q supper  . ferric citrate  420 mg Oral TID WC  . gabapentin  100 mg Oral QHS  . heparin  5,000 Units Subcutaneous Q8H  . levothyroxine  25 mcg Oral QAC breakfast  . [START ON 02/14/2019] midodrine  10 mg Oral 2 times per day on Tue Thu Sat  . sertraline  100 mg Oral Daily     Dialysis Orders: [facility] TTS 3h 55min P2 R IJ TDC Hep 4000   84.5kg - needs meds updated  Assessment/ Plan: 1. Hyperkalemia - severe (>7.5) w/ intermittent sinus brady/ junctional bradycardia in setting of missed dialysis treatment - improved with HD on 11/28. K back up to 6.3 this AM - given kayexalate and will be dialyzed shortly. Likely d/c home after for usual HD tomorrow. 2. Bradycardia: Presumed acute illness d/t #1. Had same issue in 09/2018 in setting of ^ K. 3. Depression 4. ESRD - on HD TTS schedule. HD today as above.  5. Volume: Below prior EDW - will lower on discharge.   Veneta Penton, PA-C 02/13/2019, 12:08 PM  [facility] Pager: [phone]

## 2019-02-13 NOTE — TOC Initial Note (Signed)
Transition of Care [facility]) - Initial/Assessment Note    Patient Details  Name: Mary Wilcox MRN: [id number] Date of Birth: Feb 01, 1956  [facility]) CM/SW Contact:    Benard Halsted, LCSW Phone Number: 02/13/2019, 1:55 PM  Clinical Narrative:                 Patient is a 63 year old female with past medical history of ESRD on HD (TTS), secondary hyperparathyroidism, iron deficiency anemia requiring previous transfusions, cervical cancer who presented to the ED and admitted on 11/28 after missing an HD treatment. TOC team reviewing patient due to high hospital readmission risk score. Patient lives in an apartment with stairs which is reportedly getting harder to navigate. [name] neighbor provides support when needed and patient uses [facility] for transportation to dialysis. No case management needs identified at this time.   Expected Discharge Plan: Home/Self Care Barriers to Discharge: Continued Medical Work up   Patient Goals and CMS Choice Patient states their goals for this hospitalization and ongoing recovery are:: Return home      Expected Discharge Plan and Services Expected Discharge Plan: Home/Self Care In-house Referral: NA Discharge Planning Services: NA   Living arrangements for the past 2 months: Apartment                 DME Arranged: N/A         HH Arranged: NA          Prior Living Arrangements/Services Living arrangements for the past 2 months: Apartment Lives with:: Self Patient language and need for interpreter reviewed:: Yes Do you feel safe going back to the place where you live?: Yes      Need for Family Participation in Patient Care: No (Comment) Care giver support system in place?: Yes (comment)   Criminal Activity/Legal Involvement Pertinent to Current Situation/Hospitalization: No - Comment as needed  Activities of Daily Living Home Assistive Devices/Equipment: None ADL Screening (condition at time of admission) [name]  cognitive ability adequate to safely complete daily activities?: Yes Is the patient deaf or have difficulty hearing?: No Does the patient have difficulty seeing, even when wearing glasses/contacts?: No Does the patient have difficulty concentrating, remembering, or making decisions?: No Patient able to express need for assistance with ADLs?: Yes Does the patient have difficulty dressing or bathing?: Yes Independently performs ADLs?: Yes (appropriate for developmental age) Does the patient have difficulty walking or climbing stairs?: Yes Weakness of Legs: Both Weakness of Arms/Hands: None  Permission Sought/Granted                  Emotional Assessment Appearance:: Appears stated age Attitude/Demeanor/Rapport: (Appropriate) Affect (typically observed): Accepting Orientation: : Oriented to Self, Oriented to Place, Oriented to  Time, Oriented to Situation Alcohol / Substance Use: Not Applicable Psych Involvement: No (comment)  Admission diagnosis:  Hyperkalemia [E87.5] Bradycardia [R00.1] Weakness [R53.1] Patient Active Problem List   Diagnosis Date Noted  . Generalized weakness 02/11/2019  . Hyponatremia 02/11/2019  . Hyperkalemia 09/28/2018  . Bradycardia   . Mechanical complication of other vascular device, implant, and graft 08/19/2011  . ESRD (end stage renal disease) ([place]) 08/05/2011  . Other complications due to renal dialysis device, implant, and graft 08/05/2011  . Secondary hyperparathyroidism (of renal origin)   . Anemia associated with chronic renal failure 03/06/2011  . Thrombocytosis ([place]) 03/06/2011  . Chronic kidney disease, stage V requiring chronic dialysis ([place]) 03/05/2011  . High anion gap metabolic acidosis 24/26/8341  . Hydronephrosis, bilateral 03/05/2011  .  Chronic UTI 03/05/2011  . Leukocytosis 03/05/2011   PCP:  Kristen Loader, FNP Pharmacy:   [facility] [address] ([address]), [place] - [address] [place]  ([place])  [address] Phone: [phone] Fax: [phone]  [facility] [place], [place] - [address] [place], [place] [address] Phone: [phone] Fax: [phone]     Social Determinants of Health (SDOH) Interventions    Readmission Risk Interventions Readmission Risk Prevention Plan 02/13/2019  Transportation Screening Complete  PCP or Specialist Appt within 3-5 Days Complete  HRI or [place] Complete  Social Work Consult for [place] Planning/Counseling Complete  Palliative Care Screening Not Applicable  Medication Review Press photographer) Complete  Some recent data might be hidden

## 2019-02-13 NOTE — Progress Notes (Signed)
PROGRESS NOTE    Mary Wilcox    Code Status: Full Code  IDP:[id number] DOB: 17-Jul-1955 DOA: 02/11/2019  PCP: Kristen Loader, [facility] Summary  This is a 63 year old female with past medical history of ESRD on HD (TTS), secondary hyperparathyroidism, iron deficiency anemia requiring previous transfusions, cervical cancer who presented to the ED and admitted on 11/28 after missing an HD treatment with complaints of discomfort of the lower legs and generalized weakness found to have potassium greater than 7.5 in ED.  She was given calcium gluconate, dextrose 50% solution, 5 units of insulin and an amp of sodium bicarbonate and underwent emergent dialysis by nephrology on 11/28.  Had significant improvement in symptoms and potassium following day.  A & P   Principal Problem:   Hyperkalemia Active Problems:   High anion gap metabolic acidosis   Leukocytosis   ESRD (end stage renal disease) (HCC)   Bradycardia   Generalized weakness   Hyponatremia   Acute symptomatic hyperkalemia due to missed HD Potassium greater than 7.5 on admission with EKG changes initially concerning for bigeminy and peaked T waves in precordial leads on personal read.  Underwent HD on 11/28.  K>7.5->4.9->5.4 ->(11/30) 6.3 Received Kayexalate overnight ->4.8 afternoon -HD today, likely DC in a.m. since patient has still not gone for dialysis today  ESRD on HD (TTS) Was on off schedule due to Thanksgiving holiday this past week (MWF).  Had HD Monday, overslept on Wednesday and missed her appointment which led to problem #1.  Creatinine 11.92 -HD today  Leukocytosis Suspect reactive to #1.  Resolved  Anion gap metabolic acidosis  related to above Anion gap 21. -Monitor  Bigeminy and bradycardia  secondary to electrolyte abnormalities Bedside telemetry today with abnormal tracing and repeat EKG performed.  EKG seems at baseline. -Continue telemetry  Depression -Continue Zoloft  Hyponatremia  Resolved  Hypothyroidism Stable -Continue home med   DVT prophylaxis: Heparin Diet: Renal Family Communication: No family at bedside Disposition Plan: Depending on when she goes for dialysis either DC today after dialysis or early tomorrow  Consultants  Nephrology  Procedures  HD 11/28    Subjective   Patient seen and examined at bedside in no acute distress.  Overnight she was found to have an elevated potassium of 6.3 and was given Kayexalate.  Since that time she has had multiple liquidy bowel movements and was unable to go for dialysis earlier today.  Repeat blood work showed K4.8.  She denies any other complaints.  Denies any chest pain or palpitations or shortness of breath.  Objective   Vitals:   02/13/19 1422 02/13/19 1435 02/13/19 1438 02/13/19 1500  BP: (!) 167/81 (!) 164/83 (!) 156/82 (!) 156/81  Pulse: 68 66 64 (!) 59  Resp: 17     Temp: 98 F (36.7 C)     TempSrc: Oral     SpO2: 98%     Weight: 84 kg     Height:        Intake/Output Summary (Last 24 hours) at 02/13/2019 1545 Last data filed at 02/12/2019 1700 Gross per 24 hour  Intake 320 ml  Output -  Net 320 ml   Filed Weights   02/11/19 1231 02/11/19 1615 02/13/19 1422  Weight: 85.2 kg 82 kg 84 kg    Examination:  Physical Exam Vitals signs and nursing note reviewed.  Constitutional:      Appearance: Normal appearance.  HENT:     Head: Normocephalic and atraumatic.  Eyes:     Extraocular Movements: Extraocular movements intact.  Neck:     Musculoskeletal: Normal range of motion. No neck rigidity.  Cardiovascular:     Rate and Rhythm: Normal rate and regular rhythm.  Pulmonary:     Effort: Pulmonary effort is normal.     Breath sounds: Normal breath sounds.  Abdominal:     General: Abdomen is flat.     Palpations: Abdomen is soft.  Musculoskeletal: Normal range of motion.        General: No swelling.  Neurological:     General: No focal deficit present.     Mental Status: She  is alert. Mental status is at baseline.  Psychiatric:        Mood and Affect: Mood normal.        Behavior: Behavior normal.     Data Reviewed: I have personally reviewed following labs and imaging studies  CBC: Recent Labs  Lab 02/11/19 0815 02/12/19 0427 02/13/19 0229  WBC 11.2* 6.3 6.0  HGB 14.2 11.5* 11.3*  HCT 44.0 34.5* 34.5*  MCV 103.5* 101.5* 102.4*  PLT 279 222 944   Basic Metabolic Panel: Recent Labs  Lab 02/11/19 0815 02/11/19 1810 02/12/19 0427 02/13/19 0229 02/13/19 1046  NA 134* 138 138 134* 140  K >7.5* 4.9 5.4* 6.3* 4.8  CL 91* 95* 93* 93* 96*  CO2 20* 25 26 19* 23  GLUCOSE 89 81 78 64* 76  BUN 112* 32* 42* 70* 70*  CREATININE 15.68* 7.53* 9.11* 10.88* 11.92*  CALCIUM 8.2* 8.5* 8.1* 7.5* 8.0*  MG  --   --   --  2.6*  --   PHOS  --   --  6.8*  --   --    GFR: Estimated Creatinine Clearance: 4.5 mL/min (A) (by C-G formula based on SCr of 11.92 mg/dL (H)). Liver Function Tests: Recent Labs  Lab 02/12/19 0427  ALBUMIN 3.2*   No results for input(s): LIPASE, AMYLASE in the last 168 hours. No results for input(s): AMMONIA in the last 168 hours. Coagulation Profile: No results for input(s): INR, PROTIME in the last 168 hours. Cardiac Enzymes: No results for input(s): CKTOTAL, CKMB, CKMBINDEX, TROPONINI in the last 168 hours. BNP (last 3 results) No results for input(s): PROBNP in the last 8760 hours. HbA1C: No results for input(s): HGBA1C in the last 72 hours. CBG: Recent Labs  Lab 02/11/19 0811  GLUCAP 87   Lipid Profile: No results for input(s): CHOL, HDL, LDLCALC, TRIG, CHOLHDL, LDLDIRECT in the last 72 hours. Thyroid Function Tests: Recent Labs    02/12/19 0427  TSH 1.585   Anemia Panel: No results for input(s): VITAMINB12, FOLATE, FERRITIN, TIBC, IRON, RETICCTPCT in the last 72 hours. Sepsis Labs: No results for input(s): PROCALCITON, LATICACIDVEN in the last 168 hours.  Recent Results (from the past 240 hour(s))  SARS  Coronavirus 2 by RT PCR (hospital order, performed in [facility] hospital lab) Nasopharyngeal Nasopharyngeal Swab     Status: None   Collection Time: 02/11/19 10:56 AM   Specimen: Nasopharyngeal Swab  Result Value Ref Range Status   SARS Coronavirus 2 NEGATIVE NEGATIVE Final    Comment: (NOTE) SARS-CoV-2 target nucleic acids are NOT DETECTED. The SARS-CoV-2 RNA is generally detectable in upper and lower respiratory specimens during the acute phase of infection. The lowest concentration of SARS-CoV-2 viral copies this assay can detect is 250 copies / mL. A negative result does not preclude SARS-CoV-2 infection and should not be used as the  sole basis for treatment or other patient management decisions.  A negative result may occur with improper specimen collection / handling, submission of specimen other than nasopharyngeal swab, presence of viral mutation(s) within the areas targeted by this assay, and inadequate number of viral copies (<250 copies / mL). A negative result must be combined with clinical observations, patient history, and epidemiological information. Fact Sheet for Patients:   StrictlyIdeas.no Fact Sheet for Healthcare Providers: BankingDealers.co.za This test is not yet approved or cleared  by the [place] FDA and has been authorized for detection and/or diagnosis of SARS-CoV-2 by FDA under an Emergency Use Authorization (EUA).  This EUA will remain in effect (meaning this test can be used) for the duration of the COVID-19 declaration under Section 564(b)(1) of the Act, 21 U.S.C. section 360bbb-3(b)(1), unless the authorization is terminated or revoked sooner. Performed at [facility], [place] [address], [place], [place] [address]          Radiology Studies: No results found.      Scheduled Meds: . albuterol  10 mg Nebulization Once  . calcium acetate  667 mg Oral TID WC  . Chlorhexidine  Gluconate Cloth  6 each Topical Q0600  . Chlorhexidine Gluconate Cloth  6 each Topical Q0600  . cinacalcet  90 mg Oral Q supper  . ferric citrate  420 mg Oral TID WC  . gabapentin  100 mg Oral QHS  . heparin      . [START ON 02/14/2019] heparin  3,000 Units Dialysis Once in dialysis  . heparin  5,000 Units Subcutaneous Q8H  . levothyroxine  25 mcg Oral QAC breakfast  . [START ON 02/14/2019] midodrine  10 mg Oral 2 times per day on Tue Thu Sat  . sertraline  100 mg Oral Daily   Continuous Infusions:   LOS: 2 days    Time spent: 30 minutes with over 50% of the time coordinating the patient's care    Harold Hedge, DO [facility] Pager [phone]  If 7PM-7AM, please contact night-coverage [email or web address] Password [email or web address] 02/13/2019, 3:45 PM

## 2019-02-13 NOTE — Discharge Summary (Addendum)
Physician Discharge Summary  Mary Wilcox YBW:[id number] DOB: 1955/10/28 DOA: 02/11/2019  PCP: Kristen Loader, FNP  Admit date: 02/11/2019 Discharge date: 02/14/2019   Code Status: Full Code  Admitted From: home Discharged to:  home Home Health:no  Equipment/Devices:no  Discharge Condition:stable   Recommendations for Outpatient Follow-up   1. Follow up with PCP in 1 week 2. Please follow up BMP/CBC  3. Next HD treatment 12/1  Hospital Summary  This is a 63 year old female with past medical history of ESRD on HD (TTS), secondary hyperparathyroidism, iron deficiency anemia requiring previous transfusions, cervical cancer who presented to the ED and admitted on 11/28 after missing an HD treatment with complaints of discomfort of the lower legs and generalized weakness found to have symptomatic hyperkalemia with K>7.5 in ED.  Had EKG changes at noted below.  ED Course: Upon admission to the emergency department patient was noted to be afebrile, pulse 38-74, respirations 16-21, blood pressure 137/63-170/80, and O2 saturations maintained on room air.  While in the ED patient had slipped from wheelchair, but did not sustain any substantial trauma.  Labs significant for WBC 11.2, sodium 134, potassium> 7.5, CO2 20, BUN 112, creatinine 15.68, and anion gap 23.  COVID-19 screening was negative.  Patient was given 1g calcium gluconate, an amp of dextrose 50% solution, 5 units of insulin, and an amp of sodium bicarbonate.  Underwent dialysis by nephrology on 11/28.  Had significant improvement in symptoms and potassium but did not have transportation to go to an outpatient HD session on Monday 11/30 and so she remained inpatient for an additional day to undergo a second HD session on 11/30. She had another episode of hyperkalemia overnight 11/29-30 and received kayexalate with improvement in K followed by another HD treatment on 11/30.  She was discharged in stable condition with plans for HD  outpatient 12/1 and resume normal schedule.   A & P   Principal Problem:   Hyperkalemia Active Problems:   High anion gap metabolic acidosis   Leukocytosis   ESRD (end stage renal disease) (HCC)   Bradycardia   Generalized weakness   Hyponatremia  Acute symptomatic hyperkalemia due to missed HD K> 7.5 on admission with EKG with bigeminy and peaked T waves in precordial leads on personal read.  Underwent HD on 11/28. EKG resolved. Patient underwent additional HD on 11/30  -Resume HD TTS (next session 12/1)  ESRD on HD (TTS) Was on off schedule due to Thanksgiving holiday this past week (MWF).  Had HD Monday, overslept on Wednesday and missed her appointment which led to problem #1.  -HD as above  Leukocytosis Suspect reactive to #1.  Resolved  Anion gap metabolic acidosis  related to above  Bigeminy and bradycardia  secondary to electrolyte abnormalities Resolved  Depression -Continue Zoloft  Hyponatremia  Hypothyroidism Stable -Continue home med  Consultants  . nephrology  Procedures  . HD x 2  Antibiotics  none   Subjective  Patient seen and examined at bedside no acute distress and resting comfortably.  No events overnight.  Tolerating diet. In good spirits and anticipating discharge.   Denies any chest pain, shortness of breath, fever, nausea, vomiting, urinary or bowel complaints. Otherwise ROS negative   Objective   Discharge Exam: Vitals:   02/13/19 2347 02/14/19 0444  BP:  132/64  Pulse:  67  Resp: 20 17  Temp:  98.5 F (36.9 C)  SpO2:     Vitals:   02/13/19 1750 02/13/19 2046 02/13/19 2347 02/14/19  0444  BP: 138/73 (!) 146/57  132/64  Pulse:  66  67  Resp: 20 20 20 17   Temp: 98.1 F (36.7 C) 98.4 F (36.9 C)  98.5 F (36.9 C)  TempSrc: Oral Oral  Oral  SpO2: 96% 98%    Weight:      Height:        Physical Exam    The results of significant diagnostics from this hospitalization (including imaging, microbiology,  ancillary and laboratory) are listed below for reference.     Microbiology: Recent Results (from the past 240 hour(s))  SARS Coronavirus 2 by RT PCR (hospital order, performed in [facility] hospital lab) Nasopharyngeal Nasopharyngeal Swab     Status: None   Collection Time: 02/11/19 10:56 AM   Specimen: Nasopharyngeal Swab  Result Value Ref Range Status   SARS Coronavirus 2 NEGATIVE NEGATIVE Final    Comment: (NOTE) SARS-CoV-2 target nucleic acids are NOT DETECTED. The SARS-CoV-2 RNA is generally detectable in upper and lower respiratory specimens during the acute phase of infection. The lowest concentration of SARS-CoV-2 viral copies this assay can detect is 250 copies / mL. A negative result does not preclude SARS-CoV-2 infection and should not be used as the sole basis for treatment or other patient management decisions.  A negative result may occur with improper specimen collection / handling, submission of specimen other than nasopharyngeal swab, presence of viral mutation(s) within the areas targeted by this assay, and inadequate number of viral copies (<250 copies / mL). A negative result must be combined with clinical observations, patient history, and epidemiological information. Fact Sheet for Patients:   StrictlyIdeas.no Fact Sheet for Healthcare Providers: BankingDealers.co.za This test is not yet approved or cleared  by the [place] FDA and has been authorized for detection and/or diagnosis of SARS-CoV-2 by FDA under an Emergency Use Authorization (EUA).  This EUA will remain in effect (meaning this test can be used) for the duration of the COVID-19 declaration under Section 564(b)(1) of the Act, 21 U.S.C. section 360bbb-3(b)(1), unless the authorization is terminated or revoked sooner. Performed at [facility], [place] [address] [place], [place] [address]   MRSA PCR Screening     Status: None   Collection  Time: 02/13/19  9:30 PM   Specimen: Nasopharyngeal  Result Value Ref Range Status   MRSA by PCR NEGATIVE NEGATIVE Final    Comment:        The GeneXpert MRSA Assay (FDA approved for NASAL specimens only), is one component of a comprehensive MRSA colonization surveillance program. It is not intended to diagnose MRSA infection nor to guide or monitor treatment for MRSA infections. Performed at [facility], [place] [address] [place], [place] [address]      Labs: BNP (last 3 results) No results for input(s): BNP in the last 8760 hours. Basic Metabolic Panel: Recent Labs  Lab 02/11/19 1810 02/12/19 0427 02/13/19 0229 02/13/19 1046 02/14/19 0005  NA 138 138 134* 140 137  K 4.9 5.4* 6.3* 4.8 3.7  CL 95* 93* 93* 96* 95*  CO2 25 26 19* 23 26  GLUCOSE 81 78 64* 76 85  BUN 32* 42* 70* 70* 34*  CREATININE 7.53* 9.11* 10.88* 11.92* 7.37*  CALCIUM 8.5* 8.1* 7.5* 8.0* 7.6*  MG  --   --  2.6*  --  2.2  PHOS  --  6.8*  --   --   --    Liver Function Tests: Recent Labs  Lab 02/12/19 0427  ALBUMIN 3.2*   No results for input(s): LIPASE, AMYLASE in the last 168 hours. No results for input(s): AMMONIA in the last 168 hours. CBC: Recent Labs  Lab 02/11/19 0815 02/12/19 0427 02/13/19 0229 02/14/19 0005  WBC 11.2* 6.3 6.0 5.5  HGB 14.2 11.5* 11.3* 12.2  HCT 44.0 34.5* 34.5* 37.5  MCV 103.5* 101.5* 102.4* 101.6*  PLT 279 222 222 211   Cardiac Enzymes: No results for input(s): CKTOTAL, CKMB, CKMBINDEX, TROPONINI in the last 168 hours. BNP: Invalid input(s): POCBNP CBG: Recent Labs  Lab 02/11/19 0811  GLUCAP 87   D-Dimer No results for input(s): DDIMER in the last 72 hours. Hgb A1c No results for input(s): HGBA1C in the last 72 hours. Lipid Profile No results for input(s): CHOL, HDL, LDLCALC, TRIG, CHOLHDL, LDLDIRECT in the last 72 hours. Thyroid function studies Recent Labs    02/12/19 0427  TSH 1.585   Anemia work up No results for input(s): VITAMINB12,  FOLATE, FERRITIN, TIBC, IRON, RETICCTPCT in the last 72 hours. Urinalysis    Component Value Date/Time   COLORURINE YELLOW 04/12/2011 0335   APPEARANCEUR TURBID (A) 04/12/2011 0335   LABSPEC 1.008 04/12/2011 0335   PHURINE 8.5 (H) 04/12/2011 0335   GLUCOSEU NEGATIVE 04/12/2011 0335   HGBUR LARGE (A) 04/12/2011 0335   BILIRUBINUR NEGATIVE 04/12/2011 0335   KETONESUR NEGATIVE 04/12/2011 0335   PROTEINUR 100 (A) 04/12/2011 0335   UROBILINOGEN 0.2 04/12/2011 0335   NITRITE NEGATIVE 04/12/2011 0335   LEUKOCYTESUR LARGE (A) 04/12/2011 0335   Sepsis Labs Invalid input(s): PROCALCITONIN,  WBC,  LACTICIDVEN Microbiology Recent Results (from the past 240 hour(s))  SARS Coronavirus 2 by RT PCR (hospital order, performed in [place] hospital lab) Nasopharyngeal Nasopharyngeal Swab     Status: None   Collection Time: 02/11/19 10:56 AM   Specimen: Nasopharyngeal Swab  Result Value Ref Range Status   SARS Coronavirus 2 NEGATIVE NEGATIVE Final    Comment: (NOTE) SARS-CoV-2 target nucleic acids are NOT DETECTED. The SARS-CoV-2 RNA is generally detectable in upper and lower respiratory specimens during the acute phase of infection. The lowest concentration of SARS-CoV-2 viral copies this assay can detect is 250 copies / mL. A negative result does not preclude SARS-CoV-2 infection and should not be used as the sole basis for treatment or other patient management decisions.  A negative result may occur with improper specimen collection / handling, submission of specimen other than nasopharyngeal swab, presence of viral mutation(s) within the areas targeted by this assay, and inadequate number of viral copies (<250 copies / mL). A negative result must be combined with clinical observations, patient history, and epidemiological information. Fact Sheet for Patients:   StrictlyIdeas.no Fact Sheet for Healthcare Providers: BankingDealers.co.za This  test is not yet approved or cleared  by the [place] FDA and has been authorized for detection and/or diagnosis of SARS-CoV-2 by FDA under an Emergency Use Authorization (EUA).  This EUA will remain in effect (meaning this test can be used) for the duration of the COVID-19 declaration under Section 564(b)(1) of the Act, 21 U.S.C. section 360bbb-3(b)(1), unless the authorization is terminated or revoked sooner. Performed at [facility], [place] [address], [place], [place] [address]   MRSA PCR Screening     Status: None   Collection Time: 02/13/19  9:30 PM   Specimen: Nasopharyngeal  Result Value Ref Range Status   MRSA by PCR NEGATIVE NEGATIVE Final    Comment:        The GeneXpert MRSA  Assay (FDA approved for NASAL specimens only), is one component of a comprehensive MRSA colonization surveillance program. It is not intended to diagnose MRSA infection nor to guide or monitor treatment for MRSA infections. Performed at [facility], [place] [address] [place], [place] [address]     Discharge Instructions     Discharge Instructions    Diet renal 60/70-04-17-1198   Complete by: As directed    Discharge instructions   Complete by: As directed    You were seen and examined in the hospital for elevated potassium after missing your dialysis treatment last week and cared for by a hospitalist and nephrologist. You had two sessions of dialysis while hospitalized  Upon Discharge:  -Do not miss any dialysis treatments going forward -Your next dialysis treatment is today 12/1. You need to get to the dialysis center before 11:30 am today. Make an appointment with your primary care physician within 7 days Get lab work prior to your follow up appointment with your PCP Bring all home medications to your appointment to review Request that your primary physician go over all hospital tests and procedures/radiological results at the follow up.   Please get all hospital records  sent to your physician by signing a hospital release before you go home.     Read the complete instructions along with all the possible side effects for all the medicines you take and that have been prescribed to you. Take any new medicines after you have completely understood and accept all the possible adverse reactions/side effects.   If you have any questions about your discharge medications or the care you received while you were in the hospital, you can call the unit and asked to speak with the hospitalist on call. Once you are discharged, your primary care physician will handle any further medical issues. Please note that NO REFILLS for any discharge medications will be authorized, as it is imperative that you return to your primary care physician (or establish a relationship with a primary care physician if you do not have one) for your aftercare needs so that they can reassess your need for medications and monitor your lab values.   Do not drive, operate heavy machinery, perform activities at heights, swimming or participation in water activities or provide baby sitting services if your were admitted for loss of consciousness/seizures or if you are on sedating medications including, but not limited to benzodiazepines, sleep medications, narcotic pain medications, etc., until you have been cleared to do so by a medical doctor.   Do not take more than prescribed medications.   Wear a seat belt while driving.  If you have smoked or chewed Tobacco in the last 2 years please stop smoking; also stop any regular Alcohol and/or any Recreational drug use including marijuana.  If you experience worsening of your admission symptoms or develop shortness of breath, chest pain, suicidal or homicidal thoughts or experience a life threatening emergency, you must seek medical attention immediately by calling 911 or calling your PCP immediately.   Increase activity slowly   Complete by: As directed       Allergies as of 02/14/2019      Reactions   Prozac [fluoxetine Hcl] Hives      Medication List    TAKE these medications   acetaminophen 500 MG tablet Commonly known as: TYLENOL Take 1,000 mg by mouth every 6 (six) hours as needed for headache (pain).   Auryxia 1 GM 210 MG(Fe) tablet Generic drug: ferric  citrate Take 210-420 mg by mouth See admin instructions. Take two tablets (420 mg) by mouth three times daily with meals and one tablet (210 mg) up to 3 times daily with snacks   calcium acetate 667 MG capsule Commonly known as: PHOSLO Take 667 mg by mouth See admin instructions. Take one capsule (667 mg) by mouth three times daily with meals and 2-3 times with snacks.   cinacalcet 90 MG tablet Commonly known as: SENSIPAR Take 90 mg by mouth at bedtime.   gabapentin 100 MG capsule Commonly known as: NEURONTIN Take 100 mg by mouth at bedtime. For leg pain   levothyroxine 25 MCG tablet Commonly known as: SYNTHROID Take 25 mcg by mouth daily before breakfast.   midodrine 10 MG tablet Commonly known as: PROAMATINE Take 10 mg by mouth See admin instructions. Take one tablet (10 mg) by mouth twice on dialysis days (Tuesday, Thursday, Saturday) at 4am and 8am   sertraline 100 MG tablet Commonly known as: ZOLOFT Take 100 mg by mouth daily.   SUPER B COMPLEX PO Take 1 tablet by mouth daily.   vitamin B-12 1000 MCG tablet Commonly known as: CYANOCOBALAMIN Take 1,000 mcg by mouth daily.   zolpidem 10 MG tablet Commonly known as: AMBIEN Take 10 mg by mouth at bedtime as needed for sleep.       Allergies  Allergen Reactions  . Prozac [Fluoxetine Hcl] Hives    Time coordinating discharge: Over 30 minutes   SIGNED:   Harold Hedge, D.O. [facility] Pager: [phone]  02/14/2019, 10:45 AM

## 2019-02-14 DIAGNOSIS — N186 End stage renal disease: Secondary | ICD-10-CM | POA: Diagnosis not present

## 2019-02-14 DIAGNOSIS — Z992 Dependence on renal dialysis: Secondary | ICD-10-CM | POA: Diagnosis not present

## 2019-02-14 DIAGNOSIS — E875 Hyperkalemia: Secondary | ICD-10-CM | POA: Diagnosis not present

## 2019-02-14 LAB — BASIC METABOLIC PANEL
Anion gap: 16 — ABNORMAL HIGH (ref 5–15)
BUN: 34 mg/dL — ABNORMAL HIGH (ref 8–23)
CO2: 26 mmol/L (ref 22–32)
Calcium: 7.6 mg/dL — ABNORMAL LOW (ref 8.9–10.3)
Chloride: 95 mmol/L — ABNORMAL LOW (ref 98–111)
Creatinine, Ser: 7.37 mg/dL — ABNORMAL HIGH (ref 0.44–1.00)
GFR calc Af Amer: 6 mL/min — ABNORMAL LOW (ref >60)
GFR calc non Af Amer: 5 mL/min — ABNORMAL LOW (ref >60)
Glucose, Bld: 85 mg/dL (ref 70–99)
Potassium: 3.7 mmol/L (ref 3.5–5.1)
Sodium: 137 mmol/L (ref 135–145)

## 2019-02-14 LAB — CBC
HCT: 37.5 % (ref 36.0–46.0)
Hemoglobin: 12.2 g/dL (ref 12.0–15.0)
MCH: 33.1 pg (ref 26.0–34.0)
MCHC: 32.5 g/dL (ref 30.0–36.0)
MCV: 101.6 fL — ABNORMAL HIGH (ref 80.0–100.0)
Platelets: 211 10*3/uL (ref 150–400)
RBC: 3.69 MIL/uL — ABNORMAL LOW (ref 3.87–5.11)
RDW: 14 % (ref 11.5–15.5)
WBC: 5.5 10*3/uL (ref 4.0–10.5)
nRBC: 0 % (ref 0.0–0.2)

## 2019-02-14 LAB — MAGNESIUM: Magnesium: 2.2 mg/dL (ref 1.7–2.4)

## 2019-02-14 NOTE — Progress Notes (Signed)
Renal Navigator met with patient who is cleared for discharge today on her normal (TTS) HD day. Patient has been rescheduled at her home clinic/[facility] as long as she can arrive between 11:30-12:00 today. Renal Navigator met with patient to discuss. She states she usually transports to HD via [facility]. She states her daughter can pick her up from the hospital and take her to HD. Renal Navigator spoke with [facility] scheduler and has arranged for patient to contact [facility] for transport home as a "will call" when she is finished with treatment. Renal Navigator confirmed with [facility] Clinic that she will be there today. Renal PA aware and will send OP HD orders to clinic. Patient to be discharged this morning and will not have HD inpatient. Bedside RN aware of plan.   Alphonzo Cruise, [place] Renal Navigator [phone]

## 2019-02-14 NOTE — Care Management Important Message (Signed)
Important Message  Patient Details  Name: Mary Wilcox MRN: [id number] Date of Birth: 12-21-1955   Medicare Important Message Given:  Yes     Jaquelyn Sakamoto [address] 02/14/2019, 12:57 PM

## 2019-02-14 NOTE — Progress Notes (Signed)
  [facility] Progress Note   Subjective:  Seen in room - feels much better. No dyspnea and diarrhea resolved. Plan is for d/c this morning to make it to her usual outpatient HD.  Objective Vitals:   02/13/19 1750 02/13/19 2046 02/13/19 2347 02/14/19 0444  BP: 138/73 (!) 146/57  132/64  Pulse:  66  67  Resp: 20 20 20 17   Temp: 98.1 F (36.7 C) 98.4 F (36.9 C)  98.5 F (36.9 C)  TempSrc: Oral Oral  Oral  SpO2: 96% 98%    Weight:      Height:       Physical Exam General: Well appearing, NAD Heart: RRR; no murmur Lungs: CTA anteriorly Extremities: No LE edema Dialysis Access: [facility]   Additional Objective Labs: Basic Metabolic Panel: Recent Labs  Lab 02/12/19 0427 02/13/19 0229 02/13/19 1046 02/14/19 0005  NA 138 134* 140 137  K 5.4* 6.3* 4.8 3.7  CL 93* 93* 96* 95*  CO2 26 19* 23 26  GLUCOSE 78 64* 76 85  BUN 42* 70* 70* 34*  CREATININE 9.11* 10.88* 11.92* 7.37*  CALCIUM 8.1* 7.5* 8.0* 7.6*  PHOS 6.8*  --   --   --    Liver Function Tests: Recent Labs  Lab 02/12/19 0427  ALBUMIN 3.2*  CBC: Recent Labs  Lab 02/11/19 0815 02/12/19 0427 02/13/19 0229 02/14/19 0005  WBC 11.2* 6.3 6.0 5.5  HGB 14.2 11.5* 11.3* 12.2  HCT 44.0 34.5* 34.5* 37.5  MCV 103.5* 101.5* 102.4* 101.6*  PLT 279 222 222 211  Medications:  . albuterol  10 mg Nebulization Once  . calcium acetate  667 mg Oral TID WC  . Chlorhexidine Gluconate Cloth  6 each Topical Q0600  . Chlorhexidine Gluconate Cloth  6 each Topical Q0600  . cinacalcet  90 mg Oral Q supper  . ferric citrate  420 mg Oral TID WC  . gabapentin  100 mg Oral QHS  . heparin  3,000 Units Dialysis Once in dialysis  . heparin  5,000 Units Subcutaneous Q8H  . levothyroxine  25 mcg Oral QAC breakfast  . midodrine  10 mg Oral 2 times per day on Tue Thu Sat  . sertraline  100 mg Oral Daily    Dialysis Orders: [facility] TTS 3h 31min P2 R IJ TDC Hep 412878.6VE - needs meds  updated  Assessment/ Plan: 1. Hyperkalemia - severe (>7.5) w/ intermittent sinus brady/ junctional bradycardia in setting of missed dialysis treatment - improved with HD on 11/28. K back up to 6.3 11/30 - given kayexalate and s/p HD - improved. Will d/c home after for usual HD today. 2. Bradycardia: Presumed acute illness d/t #1. Had same issue in 09/2018 in setting of ^ K. 3. Depression 4. ESRD - on HD TTS schedule.  5. Volume: Below prior EDW - will lower on discharge.  Veneta Penton, PA-C 02/14/2019, 9:50 AM  [employer] Pager: [phone]

## 2019-02-14 NOTE — Progress Notes (Signed)
Physical Therapy Treatment Patient Details Name: Mary Wilcox MRN: [id number] DOB: 04-Jun-1955 Today's Date: 02/14/2019    History of Present Illness  Mary Wilcox is a 63 y.o. female with medical history significant of ESRD on HD(TTS), secondary hyperparathyroidism, iron deficiency anemia requiring previous transfusion, and history of cervical cancer.  She presents with complaints of weakness over the last 2 days. Pt had missed most recent HD session. Had a fall out of w/c in ED without sig trauma.     PT Comments    Pt progressing with functional mobility this session, able to ambulate further distance up to 60 ft this session without AD and min guard assist. Discussed use of RW or SPC in order to increase independence with mobility and minimize fall risk, pt agreeable to try using RW at home/in community. Pt able to ambulate independently with use of RW. Pt continues to report pain in L knee following a fall, therapist applied Ace wrap for compression to L knee during ambulation, pt reports this helps her "feel more stable." Pt declined practice of steps today, therapist educated practicing for simulation of home set up. Since pt declines, discussed techniques for stair navigation including using B rails, step to pattern and stepping up with stronger leg first. Pt will benefit from acute therapy services to maximize functional independence with mobility, no follow up physical therapy recommended.     Follow Up Recommendations  No PT follow up     Equipment Recommendations  Rolling walker with 5" wheels    Recommendations for Other Services       Precautions / Restrictions Precautions Precautions: Fall Restrictions Weight Bearing Restrictions: No    Mobility  Bed Mobility Overal bed mobility: Modified Independent             General bed mobility comments: no difficulty getting to EOB  Transfers Overall transfer level: Modified independent Equipment used: None                Ambulation/Gait Ambulation/Gait assistance: Min guard Gait Distance (Feet): 60 Feet Assistive device: None Gait Pattern/deviations: Wide base of support;Decreased step length - right;Decreased step length - left Gait velocity: decreased Gait velocity interpretation: 1.31 - 2.62 ft/sec, indicative of limited community ambulator General Gait Details: tends to "furniture walk" using foot of bed, counter, etc. No gross weakness or instability noted. min guard without AD, Mod I with use of RW or SPC   Stairs Stairs: (pt declines practicing stairs this session)               Balance Overall balance assessment: Mild deficits observed, not formally tested                                          Cognition Arousal/Alertness: Awake/alert Behavior During Therapy: WFL for tasks assessed/performed Overall Cognitive Status: Within Functional Limits for tasks assessed                                        Exercises      General Comments General comments (skin integrity, edema, etc.): discussed techniques for ascending/descending steps at home inlcuding use of B rails, step to pattern, "up with the good/stronger leg, down with the bad/weaker leg"      Pertinent Vitals/Pain Faces Pain Scale: Hurts a  little bit Pain Location: L knee from falling, mildly bruised and point tender superior knee Pain Descriptors / Indicators: Aching;Guarding Pain Intervention(s): Limited activity within patient's tolerance;Monitored during session;Other (comment)(Ace wrap applied)    Home Living                      Prior Function            PT Goals (current goals can now be found in the care plan section) Progress towards PT goals: Progressing toward goals    Frequency    Min 3X/week      PT Plan Current plan remains appropriate    Co-evaluation              AM-PAC PT "6 Clicks" Mobility   Outcome Measure  Help needed  turning from your back to your side while in a flat bed without using bedrails?: None Help needed moving from lying on your back to sitting on the side of a flat bed without using bedrails?: None Help needed moving to and from a bed to a chair (including a wheelchair)?: None Help needed standing up from a chair using your arms (e.g., wheelchair or bedside chair)?: A Little Help needed to walk in hospital room?: A Little Help needed climbing 3-5 steps with a railing? : A Little 6 Click Score: 21    End of Session Equipment Utilized During Treatment: Gait belt Activity Tolerance: Patient tolerated treatment well Patient left: in chair;with call bell/phone within reach Nurse Communication: Mobility status PT Visit Diagnosis: History of falling (Z91.81);Muscle weakness (generalized) (M62.81)     Time: 8144-8185 PT Time Calculation (min) (ACUTE ONLY): 14 min  Charges:  $Therapeutic Activity: 8-22 mins                     Netta Corrigan, PT, DPT, CSRS Acute Rehab Office [place] 02/14/2019, 9:06 AM

## 2019-03-17 DIAGNOSIS — N139 Obstructive and reflux uropathy, unspecified: Secondary | ICD-10-CM | POA: Diagnosis not present

## 2019-03-17 DIAGNOSIS — Z992 Dependence on renal dialysis: Secondary | ICD-10-CM | POA: Diagnosis not present

## 2019-03-17 DIAGNOSIS — N186 End stage renal disease: Secondary | ICD-10-CM | POA: Diagnosis not present

## 2019-03-19 DIAGNOSIS — D509 Iron deficiency anemia, unspecified: Secondary | ICD-10-CM | POA: Diagnosis not present

## 2019-03-19 DIAGNOSIS — N186 End stage renal disease: Secondary | ICD-10-CM | POA: Diagnosis not present

## 2019-03-19 DIAGNOSIS — Z992 Dependence on renal dialysis: Secondary | ICD-10-CM | POA: Diagnosis not present

## 2019-03-19 DIAGNOSIS — N2581 Secondary hyperparathyroidism of renal origin: Secondary | ICD-10-CM | POA: Diagnosis not present

## 2019-03-21 DIAGNOSIS — N186 End stage renal disease: Secondary | ICD-10-CM | POA: Diagnosis not present

## 2019-03-21 DIAGNOSIS — Z992 Dependence on renal dialysis: Secondary | ICD-10-CM | POA: Diagnosis not present

## 2019-03-21 DIAGNOSIS — D509 Iron deficiency anemia, unspecified: Secondary | ICD-10-CM | POA: Diagnosis not present

## 2019-03-21 DIAGNOSIS — N2581 Secondary hyperparathyroidism of renal origin: Secondary | ICD-10-CM | POA: Diagnosis not present

## 2019-03-22 ENCOUNTER — Other Ambulatory Visit (HOSPITAL_COMMUNITY): Payer: Medicare Other

## 2019-03-22 ENCOUNTER — Encounter: Payer: Medicare Other | Admitting: Vascular Surgery

## 2019-03-22 ENCOUNTER — Encounter (HOSPITAL_COMMUNITY): Payer: Medicare Other

## 2019-03-23 DIAGNOSIS — N2581 Secondary hyperparathyroidism of renal origin: Secondary | ICD-10-CM | POA: Diagnosis not present

## 2019-03-23 DIAGNOSIS — D509 Iron deficiency anemia, unspecified: Secondary | ICD-10-CM | POA: Diagnosis not present

## 2019-03-23 DIAGNOSIS — Z992 Dependence on renal dialysis: Secondary | ICD-10-CM | POA: Diagnosis not present

## 2019-03-23 DIAGNOSIS — N186 End stage renal disease: Secondary | ICD-10-CM | POA: Diagnosis not present

## 2019-03-25 DIAGNOSIS — N186 End stage renal disease: Secondary | ICD-10-CM | POA: Diagnosis not present

## 2019-03-25 DIAGNOSIS — D509 Iron deficiency anemia, unspecified: Secondary | ICD-10-CM | POA: Diagnosis not present

## 2019-03-25 DIAGNOSIS — Z992 Dependence on renal dialysis: Secondary | ICD-10-CM | POA: Diagnosis not present

## 2019-03-25 DIAGNOSIS — N2581 Secondary hyperparathyroidism of renal origin: Secondary | ICD-10-CM | POA: Diagnosis not present

## 2019-03-28 DIAGNOSIS — Z992 Dependence on renal dialysis: Secondary | ICD-10-CM | POA: Diagnosis not present

## 2019-03-28 DIAGNOSIS — N186 End stage renal disease: Secondary | ICD-10-CM | POA: Diagnosis not present

## 2019-03-28 DIAGNOSIS — N2581 Secondary hyperparathyroidism of renal origin: Secondary | ICD-10-CM | POA: Diagnosis not present

## 2019-03-28 DIAGNOSIS — D509 Iron deficiency anemia, unspecified: Secondary | ICD-10-CM | POA: Diagnosis not present

## 2019-03-30 DIAGNOSIS — D509 Iron deficiency anemia, unspecified: Secondary | ICD-10-CM | POA: Diagnosis not present

## 2019-03-30 DIAGNOSIS — N186 End stage renal disease: Secondary | ICD-10-CM | POA: Diagnosis not present

## 2019-03-30 DIAGNOSIS — Z992 Dependence on renal dialysis: Secondary | ICD-10-CM | POA: Diagnosis not present

## 2019-03-30 DIAGNOSIS — N2581 Secondary hyperparathyroidism of renal origin: Secondary | ICD-10-CM | POA: Diagnosis not present

## 2019-03-31 DIAGNOSIS — F324 Major depressive disorder, single episode, in partial remission: Secondary | ICD-10-CM | POA: Diagnosis not present

## 2019-03-31 DIAGNOSIS — Z0000 Encounter for general adult medical examination without abnormal findings: Secondary | ICD-10-CM | POA: Diagnosis not present

## 2019-03-31 DIAGNOSIS — F411 Generalized anxiety disorder: Secondary | ICD-10-CM | POA: Diagnosis not present

## 2019-03-31 DIAGNOSIS — G629 Polyneuropathy, unspecified: Secondary | ICD-10-CM | POA: Diagnosis not present

## 2019-03-31 DIAGNOSIS — E2839 Other primary ovarian failure: Secondary | ICD-10-CM | POA: Diagnosis not present

## 2019-03-31 DIAGNOSIS — Z1231 Encounter for screening mammogram for malignant neoplasm of breast: Secondary | ICD-10-CM | POA: Diagnosis not present

## 2019-03-31 DIAGNOSIS — F5101 Primary insomnia: Secondary | ICD-10-CM | POA: Diagnosis not present

## 2019-04-03 ENCOUNTER — Other Ambulatory Visit: Payer: Self-pay

## 2019-04-03 ENCOUNTER — Inpatient Hospital Stay (HOSPITAL_COMMUNITY)
Admission: EM | Admit: 2019-04-03 | Discharge: 2019-04-07 | DRG: 640 | Disposition: A | Discharge status: Home Health | Payer: Medicare Other | Attending: Internal Medicine | Admitting: Internal Medicine

## 2019-04-03 ENCOUNTER — Emergency Department (HOSPITAL_COMMUNITY): Payer: Medicare Other

## 2019-04-03 ENCOUNTER — Encounter (HOSPITAL_COMMUNITY): Payer: Self-pay | Admitting: Emergency Medicine

## 2019-04-03 DIAGNOSIS — L89321 Pressure ulcer of left buttock, stage 1: Secondary | ICD-10-CM | POA: Diagnosis present

## 2019-04-03 DIAGNOSIS — L8990 Pressure ulcer of unspecified site, unspecified stage: Secondary | ICD-10-CM | POA: Insufficient documentation

## 2019-04-03 DIAGNOSIS — R52 Pain, unspecified: Secondary | ICD-10-CM

## 2019-04-03 DIAGNOSIS — Z992 Dependence on renal dialysis: Secondary | ICD-10-CM

## 2019-04-03 DIAGNOSIS — R0902 Hypoxemia: Secondary | ICD-10-CM | POA: Diagnosis not present

## 2019-04-03 DIAGNOSIS — M545 Low back pain: Secondary | ICD-10-CM | POA: Diagnosis present

## 2019-04-03 DIAGNOSIS — N186 End stage renal disease: Secondary | ICD-10-CM | POA: Diagnosis present

## 2019-04-03 DIAGNOSIS — R0689 Other abnormalities of breathing: Secondary | ICD-10-CM | POA: Diagnosis not present

## 2019-04-03 DIAGNOSIS — E872 Acidosis: Secondary | ICD-10-CM | POA: Diagnosis not present

## 2019-04-03 DIAGNOSIS — Z79899 Other long term (current) drug therapy: Secondary | ICD-10-CM

## 2019-04-03 DIAGNOSIS — Z888 Allergy status to other drugs, medicaments and biological substances status: Secondary | ICD-10-CM

## 2019-04-03 DIAGNOSIS — Z6838 Body mass index (BMI) 38.0-38.9, adult: Secondary | ICD-10-CM

## 2019-04-03 DIAGNOSIS — R9431 Abnormal electrocardiogram [ECG] [EKG]: Secondary | ICD-10-CM

## 2019-04-03 DIAGNOSIS — Z79890 Hormone replacement therapy (postmenopausal): Secondary | ICD-10-CM

## 2019-04-03 DIAGNOSIS — D638 Anemia in other chronic diseases classified elsewhere: Secondary | ICD-10-CM | POA: Diagnosis present

## 2019-04-03 DIAGNOSIS — S199XXA Unspecified injury of neck, initial encounter: Secondary | ICD-10-CM | POA: Diagnosis not present

## 2019-04-03 DIAGNOSIS — Z20822 Contact with and (suspected) exposure to covid-19: Secondary | ICD-10-CM | POA: Diagnosis present

## 2019-04-03 DIAGNOSIS — Z87440 Personal history of urinary (tract) infections: Secondary | ICD-10-CM

## 2019-04-03 DIAGNOSIS — D631 Anemia in chronic kidney disease: Secondary | ICD-10-CM | POA: Diagnosis not present

## 2019-04-03 DIAGNOSIS — E669 Obesity, unspecified: Secondary | ICD-10-CM | POA: Diagnosis present

## 2019-04-03 DIAGNOSIS — Z833 Family history of diabetes mellitus: Secondary | ICD-10-CM

## 2019-04-03 DIAGNOSIS — G4700 Insomnia, unspecified: Secondary | ICD-10-CM | POA: Diagnosis not present

## 2019-04-03 DIAGNOSIS — S0990XA Unspecified injury of head, initial encounter: Secondary | ICD-10-CM | POA: Diagnosis not present

## 2019-04-03 DIAGNOSIS — E875 Hyperkalemia: Secondary | ICD-10-CM | POA: Diagnosis not present

## 2019-04-03 DIAGNOSIS — R531 Weakness: Secondary | ICD-10-CM | POA: Diagnosis not present

## 2019-04-03 DIAGNOSIS — N2581 Secondary hyperparathyroidism of renal origin: Secondary | ICD-10-CM | POA: Diagnosis present

## 2019-04-03 DIAGNOSIS — Z9115 Patient's noncompliance with renal dialysis: Secondary | ICD-10-CM | POA: Diagnosis not present

## 2019-04-03 DIAGNOSIS — Z8249 Family history of ischemic heart disease and other diseases of the circulatory system: Secondary | ICD-10-CM

## 2019-04-03 DIAGNOSIS — L89311 Pressure ulcer of right buttock, stage 1: Secondary | ICD-10-CM | POA: Diagnosis present

## 2019-04-03 DIAGNOSIS — W1830XA Fall on same level, unspecified, initial encounter: Secondary | ICD-10-CM | POA: Diagnosis present

## 2019-04-03 DIAGNOSIS — I739 Peripheral vascular disease, unspecified: Secondary | ICD-10-CM | POA: Diagnosis present

## 2019-04-03 DIAGNOSIS — R296 Repeated falls: Secondary | ICD-10-CM | POA: Diagnosis present

## 2019-04-03 DIAGNOSIS — Z8541 Personal history of malignant neoplasm of cervix uteri: Secondary | ICD-10-CM

## 2019-04-03 DIAGNOSIS — Z923 Personal history of irradiation: Secondary | ICD-10-CM

## 2019-04-03 LAB — RESPIRATORY PANEL BY RT PCR (FLU A&B, COVID)
Influenza A by PCR: NEGATIVE
Influenza B by PCR: NEGATIVE
SARS Coronavirus 2 by RT PCR: NEGATIVE

## 2019-04-03 LAB — CBC WITH DIFFERENTIAL/PLATELET
Abs Immature Granulocytes: 0.08 10*3/uL — ABNORMAL HIGH (ref 0.00–0.07)
Basophils Absolute: 0.1 10*3/uL (ref 0.0–0.1)
Basophils Relative: 0 %
Eosinophils Absolute: 0.1 10*3/uL (ref 0.0–0.5)
Eosinophils Relative: 1 %
HCT: 41.1 % (ref 36.0–46.0)
Hemoglobin: 13.4 g/dL (ref 12.0–15.0)
Immature Granulocytes: 1 %
Lymphocytes Relative: 10 %
Lymphs Abs: 1.3 10*3/uL (ref 0.7–4.0)
MCH: 33.8 pg (ref 26.0–34.0)
MCHC: 32.6 g/dL (ref 30.0–36.0)
MCV: 103.8 fL — ABNORMAL HIGH (ref 80.0–100.0)
Monocytes Absolute: 0.6 10*3/uL (ref 0.1–1.0)
Monocytes Relative: 5 %
Neutro Abs: 11.2 10*3/uL — ABNORMAL HIGH (ref 1.7–7.7)
Neutrophils Relative %: 83 %
Platelets: 286 10*3/uL (ref 150–400)
RBC: 3.96 MIL/uL (ref 3.87–5.11)
RDW: 15.7 % — ABNORMAL HIGH (ref 11.5–15.5)
WBC: 13.3 10*3/uL — ABNORMAL HIGH (ref 4.0–10.5)
nRBC: 0 % (ref 0.0–0.2)

## 2019-04-03 LAB — COMPREHENSIVE METABOLIC PANEL
ALT: 15 U/L (ref 0–44)
AST: 13 U/L — ABNORMAL LOW (ref 15–41)
Albumin: 4.1 g/dL (ref 3.5–5.0)
Alkaline Phosphatase: 154 U/L — ABNORMAL HIGH (ref 38–126)
Anion gap: 26 — ABNORMAL HIGH (ref 5–15)
BUN: 133 mg/dL — ABNORMAL HIGH (ref 8–23)
CO2: 17 mmol/L — ABNORMAL LOW (ref 22–32)
Calcium: 8.1 mg/dL — ABNORMAL LOW (ref 8.9–10.3)
Chloride: 90 mmol/L — ABNORMAL LOW (ref 98–111)
Creatinine, Ser: 16.69 mg/dL — ABNORMAL HIGH (ref 0.44–1.00)
GFR calc Af Amer: 2 mL/min — ABNORMAL LOW (ref >60)
GFR calc non Af Amer: 2 mL/min — ABNORMAL LOW (ref >60)
Glucose, Bld: 75 mg/dL (ref 70–99)
Potassium: >7.5 mmol/L (ref 3.5–5.1)
Sodium: 133 mmol/L — ABNORMAL LOW (ref 135–145)
Total Bilirubin: 0.7 mg/dL (ref 0.3–1.2)
Total Protein: 7.4 g/dL (ref 6.5–8.1)

## 2019-04-03 LAB — POTASSIUM: Potassium: 4.2 mmol/L (ref 3.5–5.1)

## 2019-04-03 LAB — MAGNESIUM: Magnesium: 2.9 mg/dL — ABNORMAL HIGH (ref 1.7–2.4)

## 2019-04-03 IMAGING — CT CT HEAD W/O CM
4 series · 16 of 47 positions shown, 18 images · non-contrast
Comparison: None.

CLINICAL DATA: Status post trauma.

EXAM:
CT HEAD WITHOUT CONTRAST
TECHNIQUE: Contiguous axial images were obtained from the base of the skull
through the vertex without intravenous contrast.

[Series 2: head without · axial · non-contrast · 0.42mm/px · z∈[-119,-4]mm · 7 of 31 slices shown, 9 images]
[im 4/31  brain]
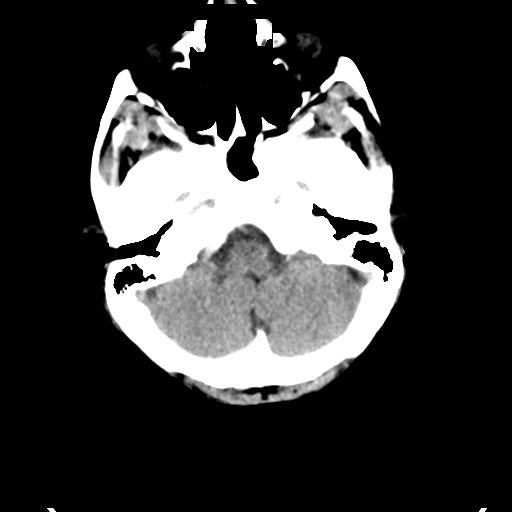
[im 4/31  bone]
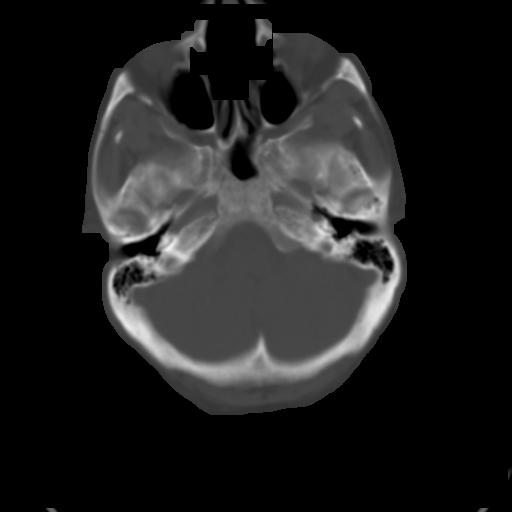
[im 8/31  brain]
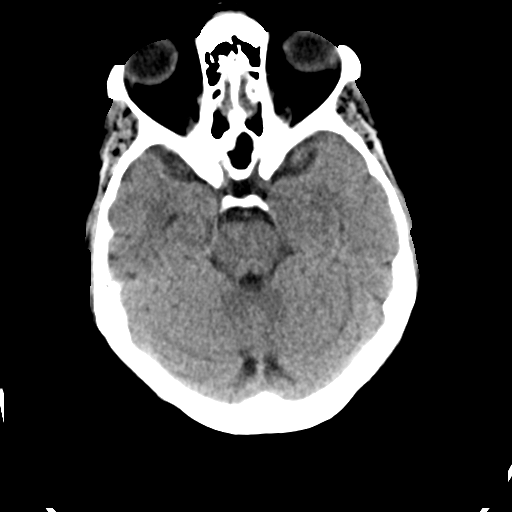
[im 12/31  brain]
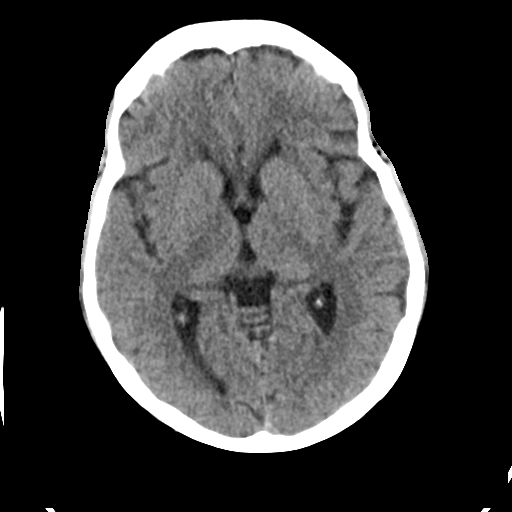
[im 16/31  brain]
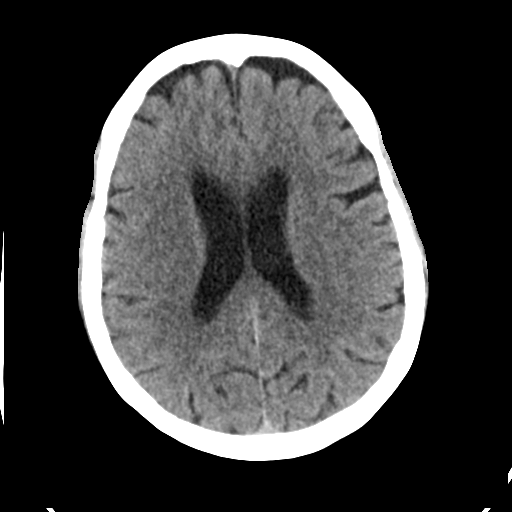
[im 19/31  brain]
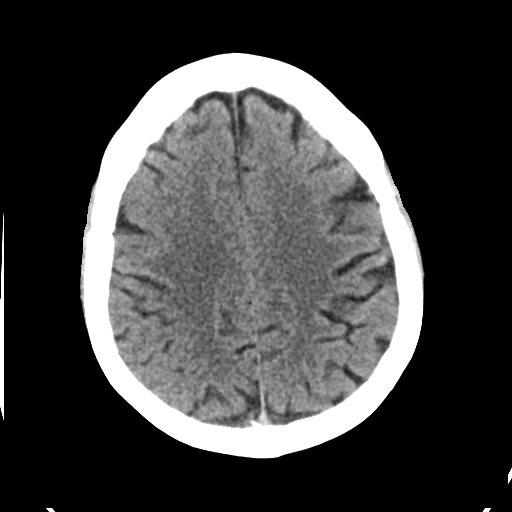
[im 19/31  bone]
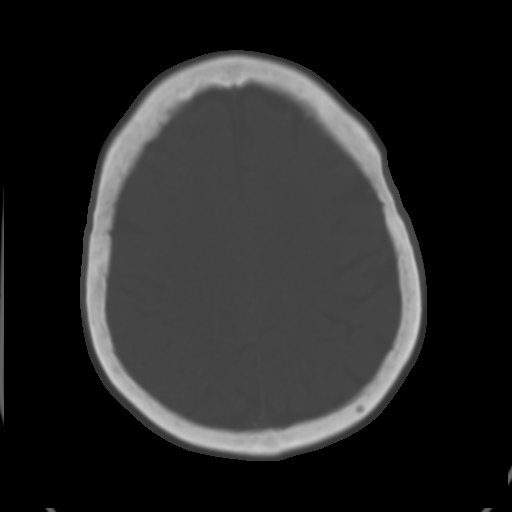
[im 23/31  brain]
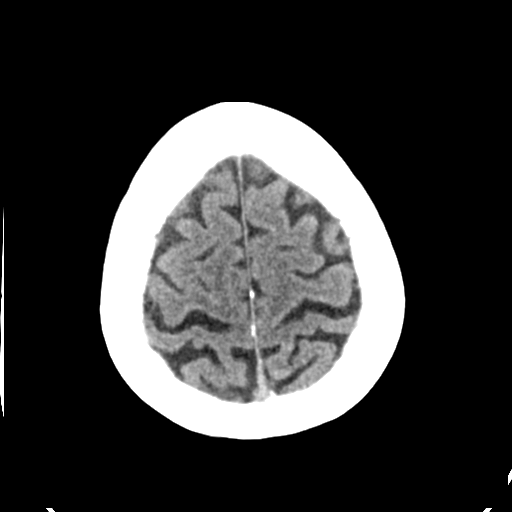
[im 27/31  brain]
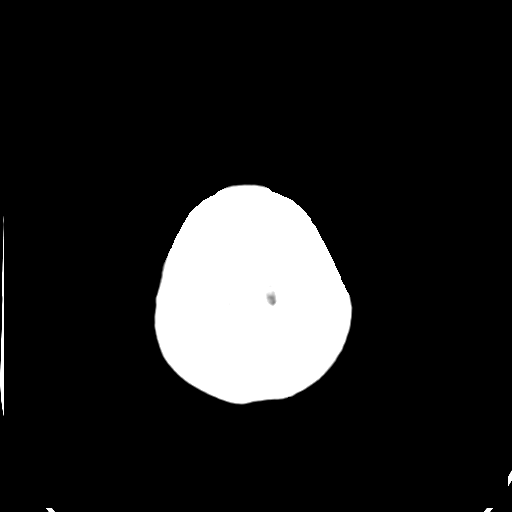

[Series 3: head bone · axial · 0.42mm/px · z∈[-120,-90]mm · 3 of 77 slices shown]
[im 8/77  bone]
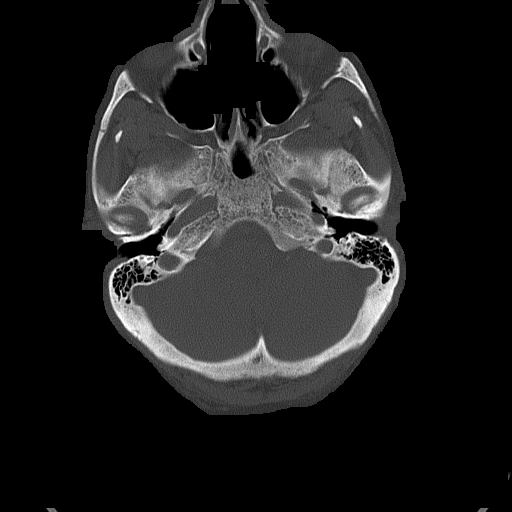
[im 16/77  bone]
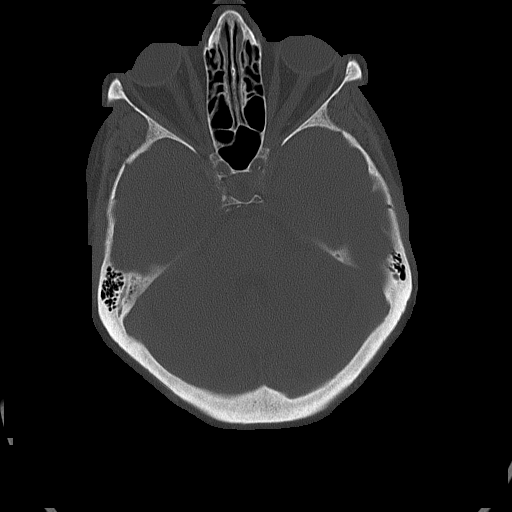
[im 23/77  bone]
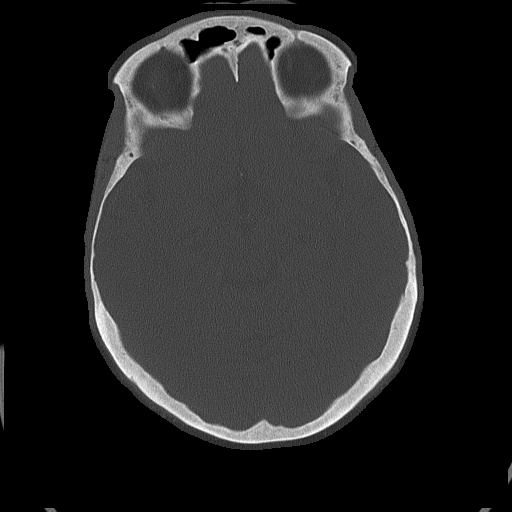

[Series 4: head without cor · coronal · non-contrast · 0.30mm/px · 3 of 67 slices shown]
[im 23/67  brain]
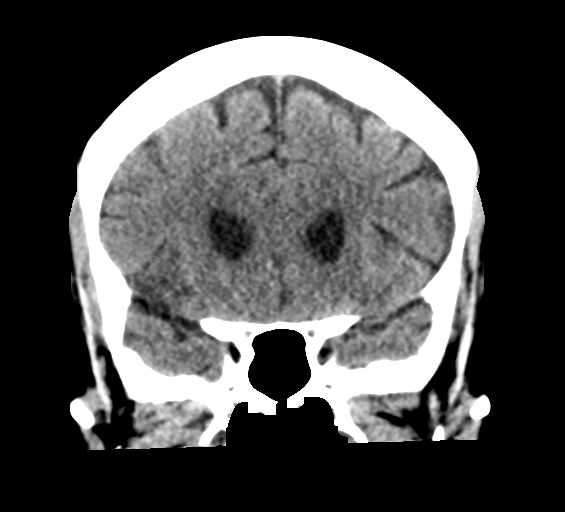
[im 30/67  brain]
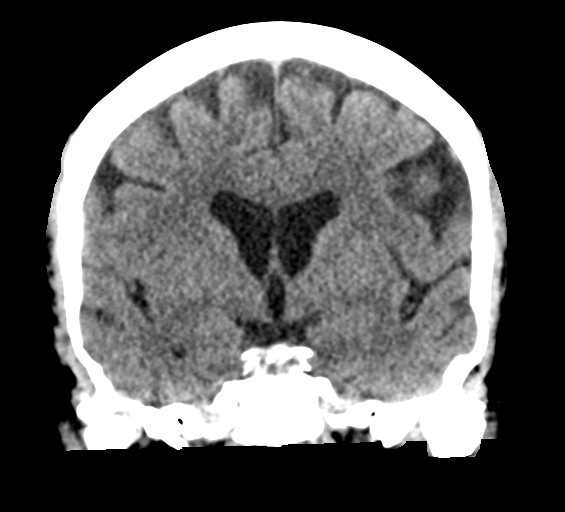
[im 37/67  brain]
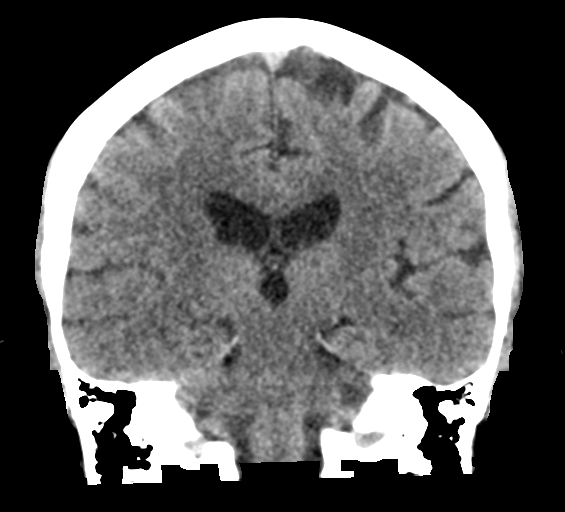

[Series 5: head without sag · sagittal · non-contrast · 0.30mm/px · 3 of 62 slices shown]
[im 21/62  brain]
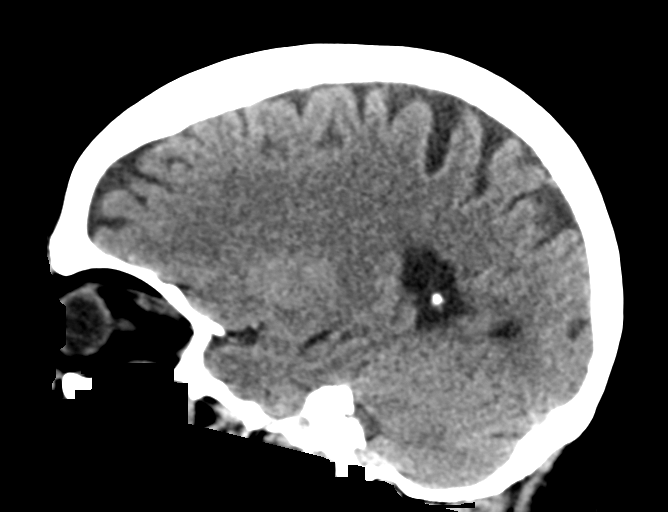
[im 31/62  brain]
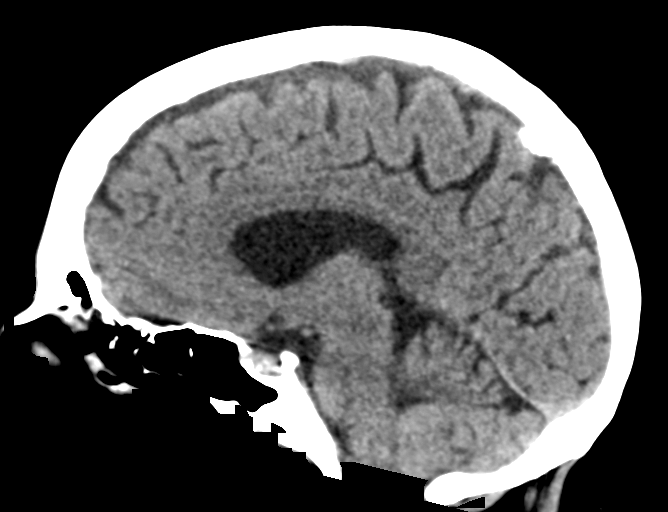
[im 41/62  brain]
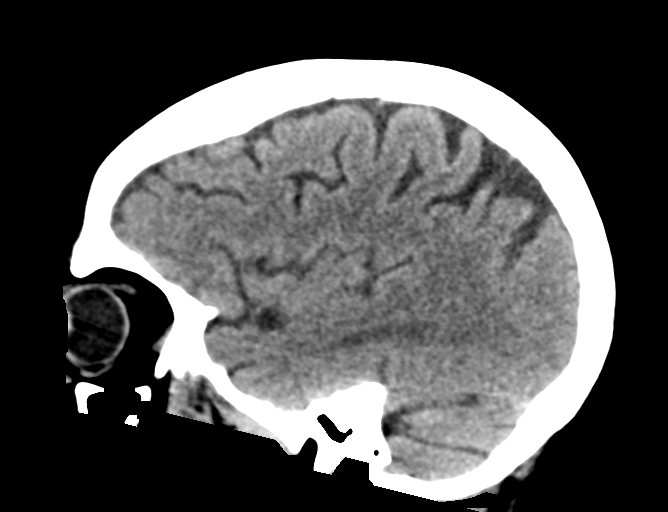

[16 of 47 positions shown; findings below may reference images not displayed]

FINDINGS: Brain: There is mild cerebral atrophy with widening of the
extra-axial spaces and ventricular dilatation.
There are areas of decreased attenuation within the white matter
tracts of the supratentorial brain, consistent with microvascular
disease changes.

Vascular: No hyperdense vessel or unexpected calcification.

Skull: Normal. Negative for fracture or focal lesion.

Sinuses/Orbits: No acute finding.

Other: None.
IMPRESSION: No acute intracranial pathology.

## 2019-04-03 IMAGING — CT CT CERVICAL SPINE W/O CM
3 of 5 series · 12 of 33 positions shown, 14 images · non-contrast
Comparison: None.

CLINICAL DATA: Status post trauma.

EXAM:
CT CERVICAL SPINE WITHOUT CONTRAST
TECHNIQUE: Multidetector CT imaging of the cervical spine was performed without
intravenous contrast. Multiplanar CT image reconstructions were also
generated.

[Series 5: c_spine 1.0 st thins · axial · 0.31mm/px · z∈[-264,-150]mm · 4 of 227 slices shown, 5 images]
[im 33/227  soft-tissue]
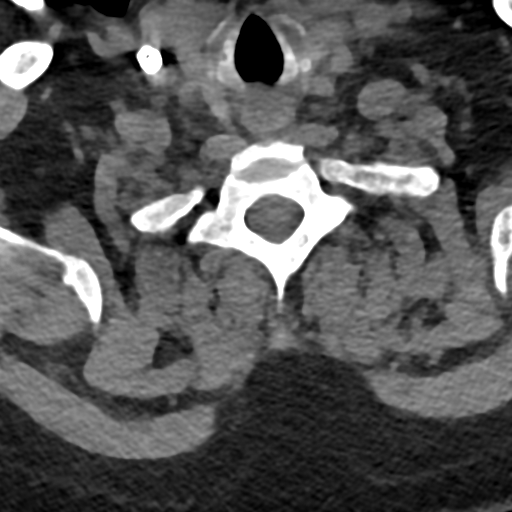
[im 33/227  bone]
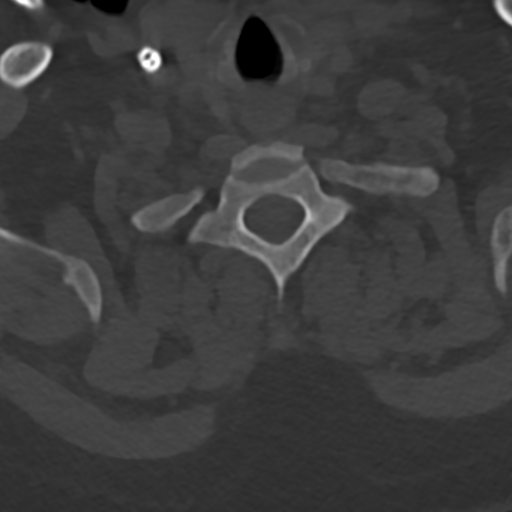
[im 97/227  bone]
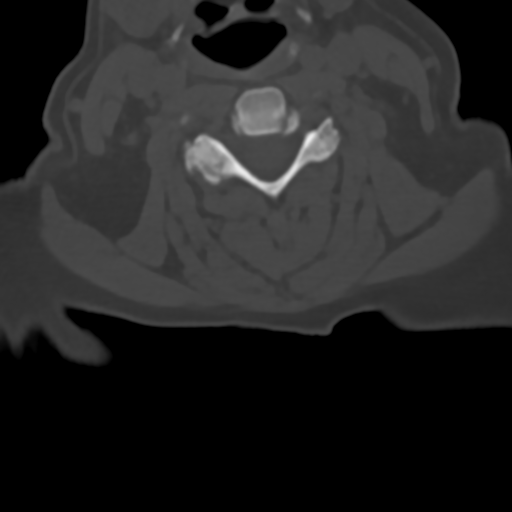
[im 130/227  bone]
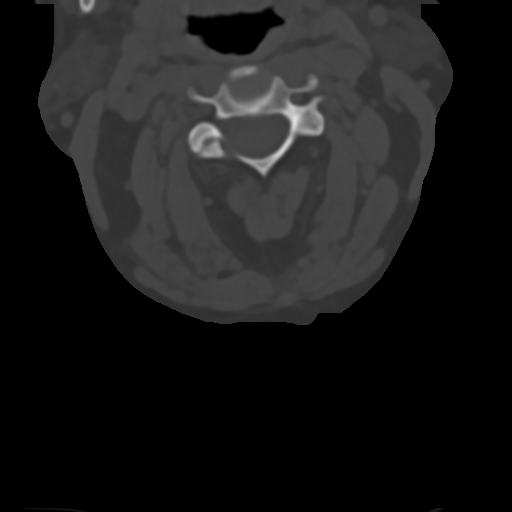
[im 194/227  bone]
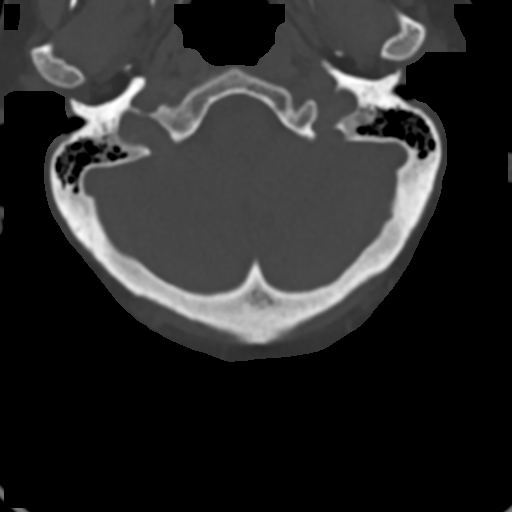

[Series 7: c_spine 2.0 sag bone · sagittal · 0.23mm/px · 5 of 61 slices shown, 6 images]
[im 21/61  bone]
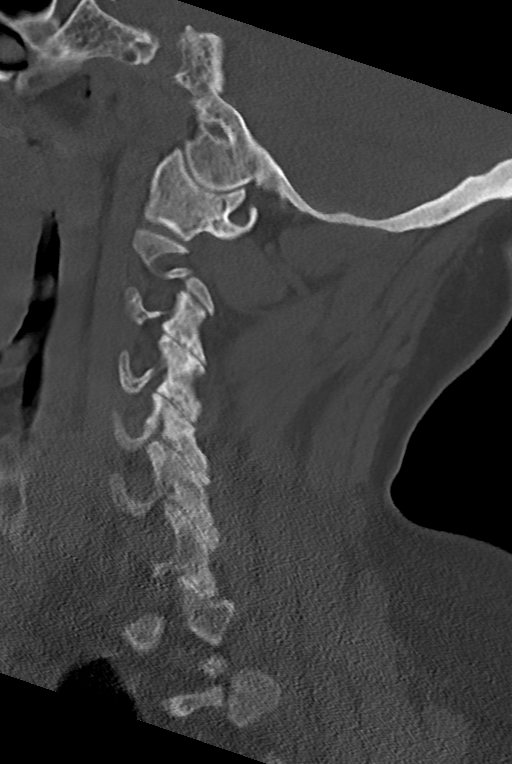
[im 26/61  bone]
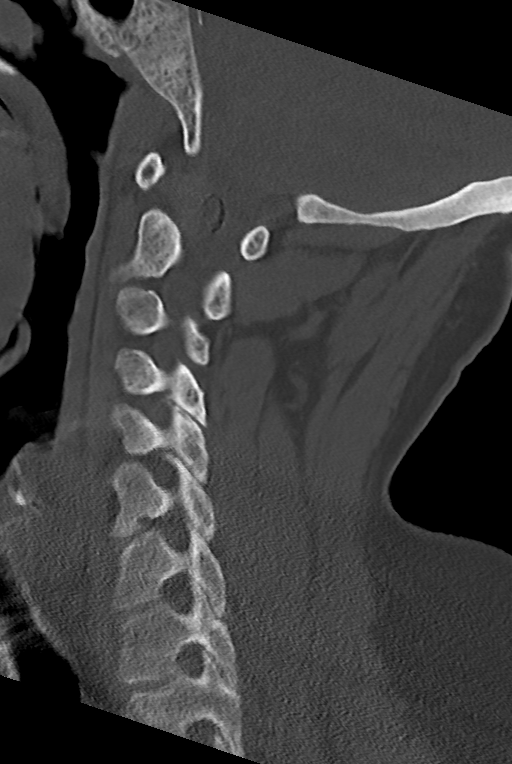
[im 31/61  soft-tissue]
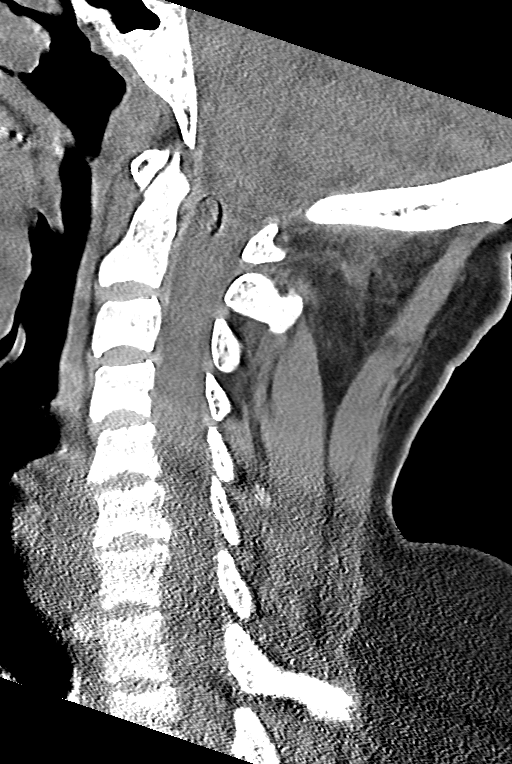
[im 31/61  bone]
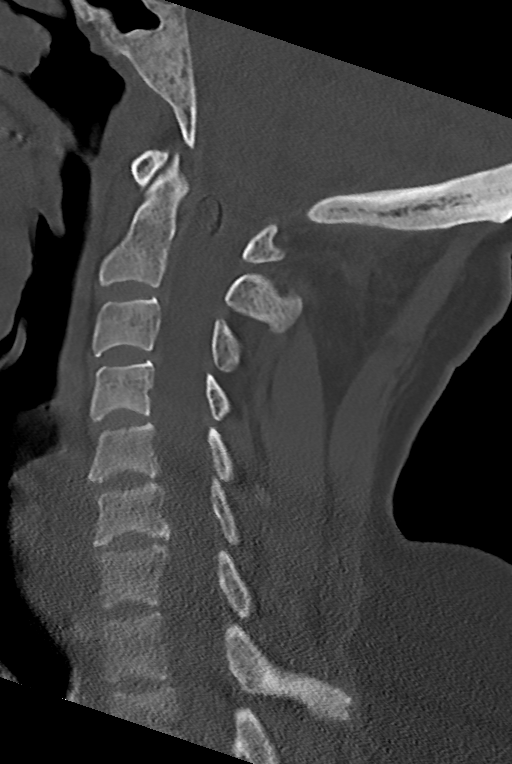
[im 36/61  bone]
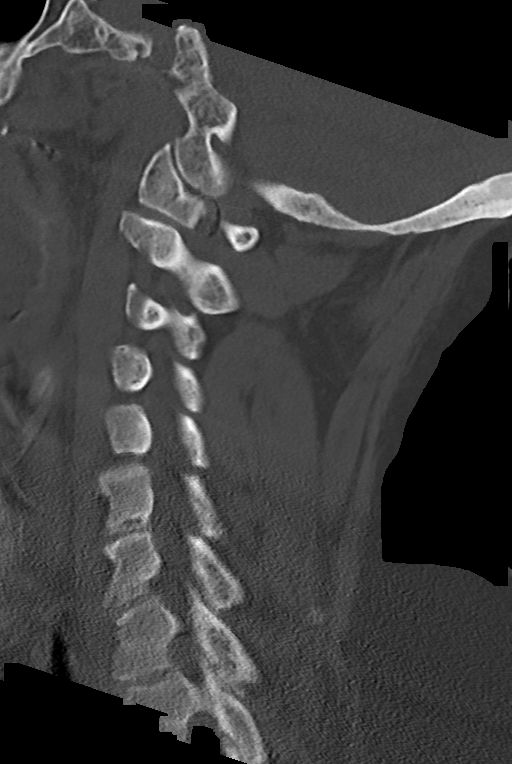
[im 41/61  bone]
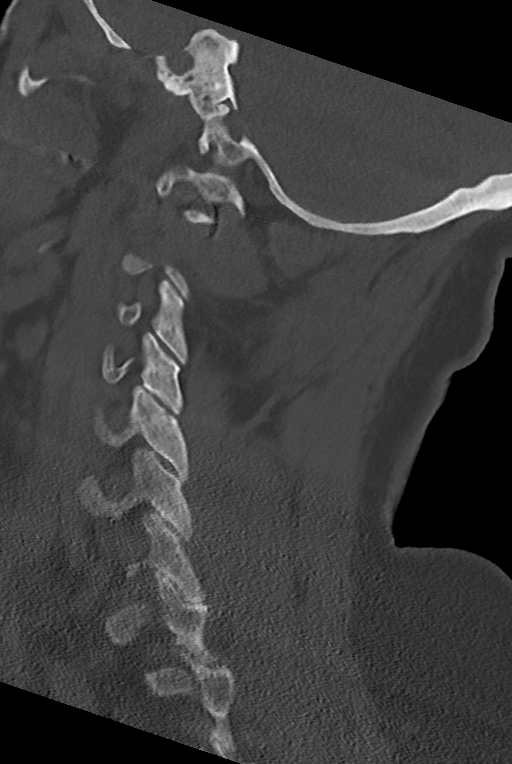

[Series 8: c_spine 2.0 cor bone · coronal · 0.23mm/px · 3 of 61 slices shown]
[im 13/61  bone]
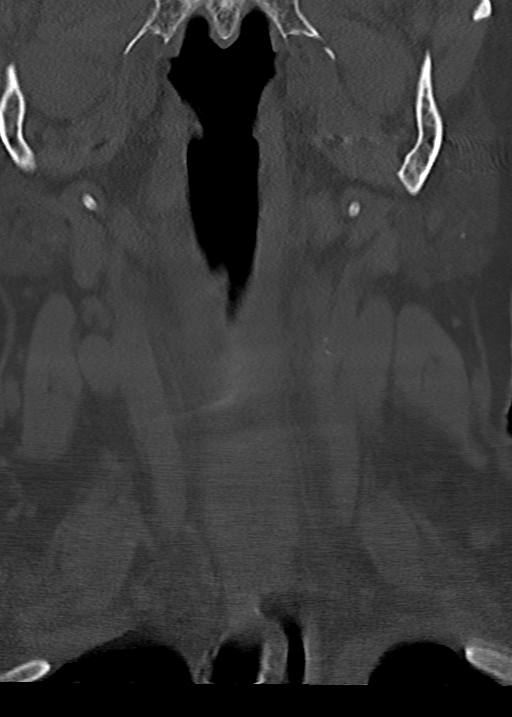
[im 25/61  bone]
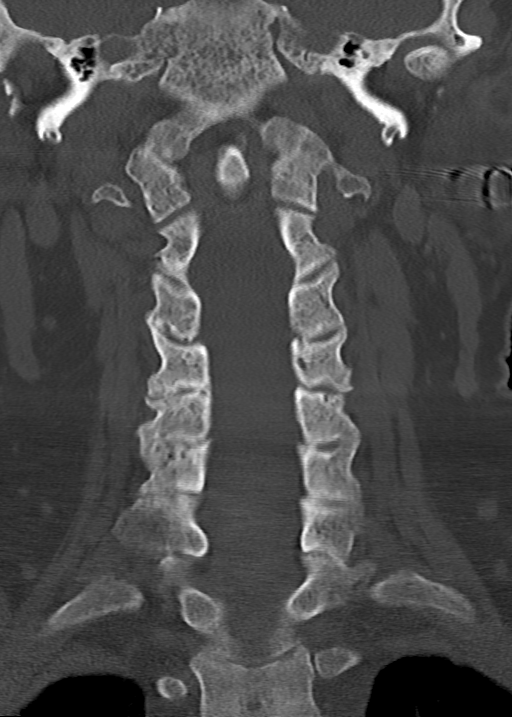
[im 37/61  bone]
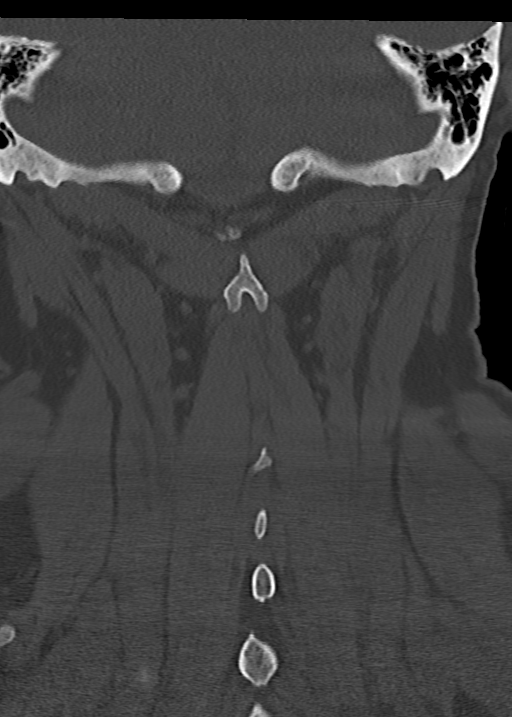

[12 of 33 positions shown; findings below may reference images not displayed]

FINDINGS: Alignment: Normal.

Skull base and vertebrae: No acute fracture. No primary bone lesion
or focal pathologic process.

Soft tissues and spinal canal: No prevertebral fluid or swelling. No
visible canal hematoma.

Disc levels:

C2-3: Normal endplates. Very mild disc space narrowing is seen.
Bilateral facet hypertrophy is noted. Normal central canal and
intervertebral neuroforamina.

C3-4: Normal endplates. Very mild disc space narrowing is seen.
Bilateral facet hypertrophy is noted. Normal central canal and
intervertebral neuroforamina.

C4-5: Normal endplates. Mild disc space narrowing is seen. Bilateral
facet hypertrophy is noted. Normal central canal and intervertebral
neuroforamina.

C5-6: Normal endplates. Mild disc space narrowing is seen. Bilateral
facet hypertrophy is noted. Normal central canal and intervertebral
neuroforamina.

C6-7: Very mild end plate spondylosis. Mild disc space narrowing is
seen. Bilateral facet hypertrophy is noted. Normal central canal and
intervertebral neuroforamina.

C7-T1: Normal endplates. Very mild disc space narrowing is seen.
Bilateral facet hypertrophy is noted. Normal central canal and
intervertebral neuroforamina.

Upper chest: Negative.

Other: None.
IMPRESSION: 1. Mild degenerative changes without evidence of acute osseous
abnormality within the cervical spine.

## 2019-04-03 IMAGING — DX DG CHEST 1V PORT
1 series · 1 of 1 positions shown · non-contrast
Comparison: [DATE]

CLINICAL DATA: Weakness.  Missed dialysis.

EXAM:
PORTABLE CHEST 1 VIEW

[chest ap]
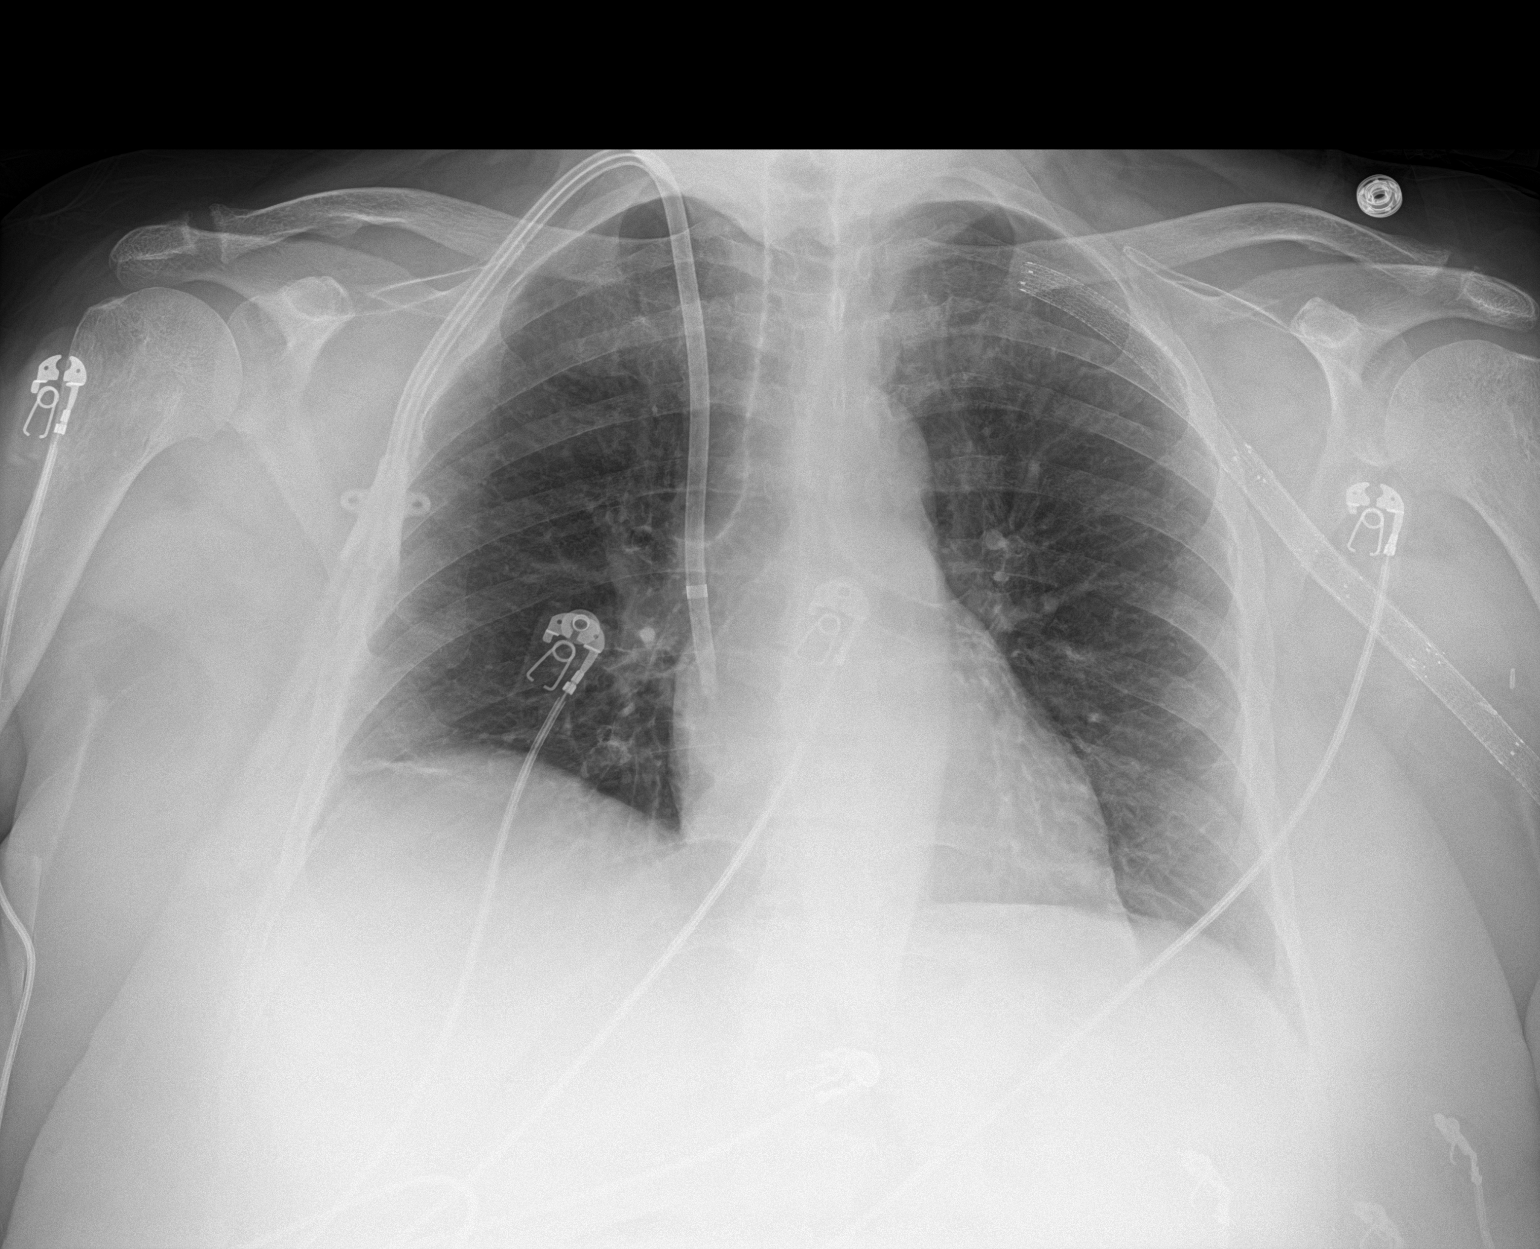

[1 of 1 positions shown; findings below may reference images not displayed]

FINDINGS: There is a right internal jugular venous catheter with its distal
tip noted just beyond the junction of the superior vena cava and
right atrium.

Very mild linear scarring and/or atelectasis is seen within the
lateral aspect of the right lung base.

There is no evidence of a pleural effusion or pneumothorax.

The heart size and mediastinal contours are within normal limits.

A radiopaque vascular stent is seen overlying the left apex and left
axilla. This represents a new finding when compared to the prior
exam.

The visualized skeletal structures are unremarkable.
IMPRESSION: 1. Mild right basilar linear scarring and/or atelectasis, without
acute or active cardiopulmonary disease.

## 2019-04-03 MED ORDER — INSULIN ASPART 100 UNIT/ML IV SOLN
5.0000 [IU] | Freq: Once | INTRAVENOUS | Status: AC
  Administered 2019-04-03: 5 [IU] via INTRAVENOUS

## 2019-04-03 MED ORDER — SODIUM ZIRCONIUM CYCLOSILICATE 10 G PO PACK
10.0000 g | PACK | Freq: Once | ORAL | Status: AC
  Administered 2019-04-03: 10 g via ORAL
  Filled 2019-04-03: qty 1

## 2019-04-03 MED ORDER — FERRIC CITRATE 1 GM 210 MG(FE) PO TABS
420.0000 mg | ORAL_TABLET | Freq: Three times a day (TID) | ORAL | Status: DC
  Administered 2019-04-05 – 2019-04-07 (×7): 420 mg via ORAL
  Filled 2019-04-03 (×12): qty 2

## 2019-04-03 MED ORDER — CALCIUM GLUCONATE-NACL 1-0.675 GM/50ML-% IV SOLN
1.0000 g | Freq: Once | INTRAVENOUS | Status: AC
  Administered 2019-04-03: 1000 mg via INTRAVENOUS
  Filled 2019-04-03: qty 50

## 2019-04-03 MED ORDER — HEPARIN SODIUM (PORCINE) 1000 UNIT/ML IJ SOLN
INTRAMUSCULAR | Status: AC
  Filled 2019-04-03: qty 3

## 2019-04-03 MED ORDER — ALBUTEROL SULFATE HFA 108 (90 BASE) MCG/ACT IN AERS
8.0000 | INHALATION_SPRAY | Freq: Once | RESPIRATORY_TRACT | Status: AC
  Administered 2019-04-03: 8 via RESPIRATORY_TRACT
  Filled 2019-04-03: qty 6.7

## 2019-04-03 MED ORDER — DEXTROSE 50 % IV SOLN
1.0000 | Freq: Once | INTRAVENOUS | Status: AC
  Administered 2019-04-03: 50 mL via INTRAVENOUS
  Filled 2019-04-03: qty 50

## 2019-04-03 MED ORDER — AEROCHAMBER PLUS FLO-VU LARGE MISC
1.0000 | Freq: Once | Status: AC
  Administered 2019-04-03: 1
  Filled 2019-04-03: qty 1

## 2019-04-03 MED ORDER — ALBUTEROL SULFATE (2.5 MG/3ML) 0.083% IN NEBU: 10.0000 mg | INHALATION_SOLUTION | Freq: Once | RESPIRATORY_TRACT | Status: DC

## 2019-04-03 MED ORDER — CHLORHEXIDINE GLUCONATE CLOTH 2 % EX PADS
6.0000 | MEDICATED_PAD | Freq: Every day | CUTANEOUS | Status: DC
  Administered 2019-04-04 – 2019-04-07 (×4): 6 via TOPICAL

## 2019-04-03 MED ORDER — SODIUM BICARBONATE 8.4 % IV SOLN
50.0000 meq | Freq: Once | INTRAVENOUS | Status: AC
  Administered 2019-04-03: 50 meq via INTRAVENOUS
  Filled 2019-04-03: qty 50

## 2019-04-03 NOTE — ED Notes (Signed)
Phlebotomy at bedside.

## 2019-04-03 NOTE — ED Provider Notes (Signed)
[facility] EMERGENCY DEPARTMENT Provider Note   CSN: [id number] Arrival date & time: 04/03/19  1542     History No chief complaint on file.   Mary Wilcox is a 64 y.o. female with a past medical history of cervical cancer, ESRD on HD Tuesday, Thursday, Saturday, thrombocytosis, who presents today for evaluation of weakness. She reports that on Saturday she missed her dialysis session as she overslept.  She states that over the past 3 to 4 days she has had worsening generalized weakness.  This caused her to fall backwards yesterday striking the back of her head on the ground.  She denies any nausea, vomiting, or diarrhea.  No chest or abdominal pain.  She reports that over the past 3 to 4 days she has had worsening weakness in her right leg with decreasing sensation.  She reports that she is unable to walk due to weakness.  HPI     Past Medical History:  Diagnosis Date  . Bilateral hydronephrosis 2006   due to obstruction from cervical cancer  . Cervical cancer ([place]) 2006   IIIB s/p ext radiation and intracavitary cessium  . Chronic kidney disease    hx several episodes of ARF secondary to obstructive uropathy  . Insomnia    takes Ambien 3x wk  . Iron deficiency anemia   . Peripheral vascular disease ([place])   . Secondary hyperparathyroidism (of renal origin)   . Transfusion history    02/2011 for Hgb 6.8    Patient Active Problem List   Diagnosis Date Noted  . Generalized weakness 02/11/2019  . Hyponatremia 02/11/2019  . Hyperkalemia 09/28/2018  . Bradycardia   . Mechanical complication of other vascular device, implant, and graft 08/19/2011  . ESRD (end stage renal disease) ([place]) 08/05/2011  . Other complications due to renal dialysis device, implant, and graft 08/05/2011  . Secondary hyperparathyroidism (of renal origin)   . Anemia associated with chronic renal failure 03/06/2011  . Thrombocytosis ([place]) 03/06/2011  . Chronic kidney disease,  stage V requiring chronic dialysis ([place]) 03/05/2011  . High anion gap metabolic acidosis 10/27/4816  . Hydronephrosis, bilateral 03/05/2011  . Chronic UTI 03/05/2011  . Leukocytosis 03/05/2011    Past Surgical History:  Procedure Laterality Date  . AV FISTULA PLACEMENT  03/05/2011   Procedure: INSERTION OF ARTERIOVENOUS (AV) GORE-TEX GRAFT ARM;  Surgeon: Angelia Mould, MD;  Location: [facility];  Service: Vascular;  Laterality: Left;  start 1115   finish 1250  . INSERTION OF DIALYSIS CATHETER  03/05/2011   Procedure: INSERTION OF DIALYSIS CATHETER;  Surgeon: Angelia Mould, MD;  Location: [facility];  Service: Vascular;  Laterality: Right;  start 1041- finish 1051  . TUBAL LIGATION    . URETER SURGERY     stent, Dr. Risa Grill for bilateral hydro     OB History   No obstetric history on file.     Family History  Problem Relation Age of Onset  . Lung cancer Father   . Cancer Father   . Cancer Mother        kidney and thyroid  . Heart disease Brother   . Diabetes Brother   . Hyperlipidemia Brother   . Hypertension Brother   . Other Brother        varicose vein    Social History   Tobacco Use  . Smoking status: Never Smoker  . Smokeless tobacco: Never Used  Substance Use Topics  . Alcohol use: No  . Drug use:  No    Home Medications Prior to Admission medications   Medication Sig Start Date End Date Taking? Authorizing Provider  acetaminophen (TYLENOL) 500 MG tablet Take 1,000 mg by mouth every 6 (six) hours as needed for headache (pain).    [provider]  B Complex-C (SUPER B COMPLEX PO) Take 1 tablet by mouth daily.    [provider]  calcium acetate (PHOSLO) 667 MG capsule Take 667 mg by mouth See admin instructions. Take one capsule (667 mg) by mouth three times daily with meals and 2-3 times with snacks. 06/18/11   [provider]  cinacalcet (SENSIPAR) 90 MG tablet Take 90 mg by mouth at bedtime.    [provider]  ferric  citrate (AURYXIA) 1 GM 210 MG(Fe) tablet Take 210-420 mg by mouth See admin instructions. Take two tablets (420 mg) by mouth three times daily with meals and one tablet (210 mg) up to 3 times daily with snacks    [provider]  gabapentin (NEURONTIN) 100 MG capsule Take 100 mg by mouth at bedtime. For leg pain 08/06/18   [provider]  levothyroxine (SYNTHROID) 25 MCG tablet Take 25 mcg by mouth daily before breakfast. 08/10/18   [provider]  midodrine (PROAMATINE) 10 MG tablet Take 10 mg by mouth See admin instructions. Take one tablet (10 mg) by mouth twice on dialysis days (Tuesday, Thursday, Saturday) at 4am and 8am 09/11/18   [provider]  sertraline (ZOLOFT) 100 MG tablet Take 100 mg by mouth daily. 09/17/18   [provider]  vitamin B-12 (CYANOCOBALAMIN) 1000 MCG tablet Take 1,000 mcg by mouth daily.    [provider]  zolpidem (AMBIEN) 10 MG tablet Take 10 mg by mouth at bedtime as needed for sleep.     [provider]    Allergies    Prozac [fluoxetine hcl]  Review of Systems   Review of Systems  Constitutional: Positive for fatigue. Negative for chills and fever.  Eyes: Negative for visual disturbance.  Respiratory: Negative for cough and shortness of breath.   Cardiovascular: Negative for chest pain.  Gastrointestinal: Negative for abdominal pain, diarrhea, nausea and vomiting.  Musculoskeletal: Negative for back pain.  Skin: Negative for color change and rash.  Neurological: Positive for weakness (Global but also worse in right leg), light-headedness (caused her to fall yesterday, no syncope) and headaches.  Psychiatric/Behavioral: Negative for confusion.  All other systems reviewed and are negative.   Physical Exam Updated Vital Signs BP 106/80   Pulse 69   Temp 98.3 F (36.8 C) (Oral)   Resp 18   Ht 5' (1.524 m)   Wt 85 kg   SpO2 100%   BMI 36.60 kg/m   Physical Exam Vitals and nursing note  reviewed.  Constitutional:      General: She is not in acute distress.    Appearance: She is well-developed. She is not diaphoretic.  HENT:     Head: Normocephalic and atraumatic.  Eyes:     General: No scleral icterus.       Right eye: No discharge.        Left eye: No discharge.     Conjunctiva/sclera: Conjunctivae normal.  Cardiovascular:     Rate and Rhythm: Normal rate and regular rhythm.  Pulmonary:     Effort: Pulmonary effort is normal. No respiratory distress.     Breath sounds: No stridor.  Abdominal:     General: There is no distension.  Musculoskeletal:  General: No deformity.     Cervical back: Normal range of motion.  Skin:    General: Skin is warm and dry.  Neurological:     Mental Status: She is alert.     Motor: No abnormal muscle tone.     Comments: She has no pronator drift bilaterally.  She has decreased strength in her bilateral lower legs.  She is unable to lift either leg off the bed.  She has 4/5 strength with ankle plantarflexion in the left foot, 2+/5 in the right foot.   She is awake and alert.   Psychiatric:        Behavior: Behavior normal.     ED Results / Procedures / Treatments   Labs (all labs ordered are listed, but only abnormal results are displayed) Labs Reviewed  COMPREHENSIVE METABOLIC PANEL - Abnormal; Notable for the following components:      Result Value   Sodium 133 (*)    Potassium >7.5 (*)    Chloride 90 (*)    CO2 17 (*)    BUN 133 (*)    Creatinine, Ser 16.69 (*)    Calcium 8.1 (*)    AST 13 (*)    Alkaline Phosphatase 154 (*)    GFR calc non Af Amer 2 (*)    GFR calc Af Amer 2 (*)    Anion gap 26 (*)    All other components within normal limits  CBC WITH DIFFERENTIAL/PLATELET - Abnormal; Notable for the following components:   WBC 13.3 (*)    MCV 103.8 (*)    RDW 15.7 (*)    Neutro Abs 11.2 (*)    Abs Immature Granulocytes 0.08 (*)    All other components within normal limits  MAGNESIUM - Abnormal;  Notable for the following components:   Magnesium 2.9 (*)    All other components within normal limits  RESPIRATORY PANEL BY RT PCR (FLU A&B, COVID)  HEPATITIS B SURFACE ANTIGEN  I-STAT CHEM 8, ED  CBG MONITORING, ED    EKG EKG Interpretation  Date/Time:  Monday April 03 2019 16:03:25 EST Ventricular Rate:  61 PR Interval:    QRS Duration: 225 QT Interval:  546 QTC Calculation: 551 R Axis:   -142 Text Interpretation: Sinus rhythm Prolonged PR interval Right bundle branch block Hyperkalemia Confirmed by Virgel Manifold [phone]) on 04/03/2019 4:43:41 PM   EKG Interpretation  Date/Time:  Monday April 03 2019 16:03:25 EST Ventricular Rate:  61 PR Interval:    QRS Duration: 225 QT Interval:  546 QTC Calculation: 551 R Axis:   -142 Text Interpretation: Sinus rhythm Prolonged PR interval Right bundle branch block Hyperkalemia Confirmed by Virgel Manifold [phone]) on 04/03/2019 4:43:41 PM        Radiology DG Chest Port 1 View  Result Date: 04/03/2019 CLINICAL DATA:  Weakness.  Missed dialysis. EXAM: PORTABLE CHEST 1 VIEW COMPARISON:  April 11, 2011 FINDINGS: There is a right internal jugular venous catheter with its distal tip noted just beyond the junction of the superior vena cava and right atrium. Very mild linear scarring and/or atelectasis is seen within the lateral aspect of the right lung base. There is no evidence of a pleural effusion or pneumothorax. The heart size and mediastinal contours are within normal limits. A radiopaque vascular stent is seen overlying the left apex and left axilla. This represents a new finding when compared to the prior exam. The visualized skeletal structures are unremarkable. IMPRESSION: 1. Mild right basilar linear scarring and/or atelectasis,  without acute or active cardiopulmonary disease. Electronically Signed   By: Virgina Norfolk M.D.   On: 04/03/2019 16:28    Procedures .Critical Care Performed by: Lorin Glass,  PA-C Authorized by: Lorin Glass, PA-C   Critical care provider statement:    Critical care time (minutes):  45   Critical care was necessary to treat or prevent imminent or life-threatening deterioration of the following conditions:  Endocrine crisis   Critical care was time spent personally by me on the following activities:  Discussions with consultants, evaluation of patient's response to treatment, examination of patient, ordering and performing treatments and interventions, ordering and review of laboratory studies, ordering and review of radiographic studies, pulse oximetry, re-evaluation of patient's condition, obtaining history from patient or surrogate and review of old charts Comments:     Hyperkalemia with EKG changes   (including critical care time)  Medications Ordered in ED Medications  Chlorhexidine Gluconate Cloth 2 % PADS 6 each (has no administration in time range)  ferric citrate (AURYXIA) tablet 420 mg (has no administration in time range)  sodium zirconium cyclosilicate (LOKELMA) packet 10 g (10 g Oral Given 04/03/19 1709)  calcium gluconate 1 g/ 50 mL sodium chloride IVPB (0 g Intravenous Stopped 04/03/19 1843)  albuterol (VENTOLIN HFA) 108 (90 Base) MCG/ACT inhaler 8 puff (8 puffs Inhalation Given 04/03/19 1711)  AeroChamber Plus Flo-Vu Large MISC 1 each (1 each Other Given 04/03/19 1745)  sodium bicarbonate injection 50 mEq (50 mEq Intravenous Given 04/03/19 1728)  insulin aspart (novoLOG) injection 5 Units (5 Units Intravenous Given 04/03/19 1745)    And  dextrose 50 % solution 50 mL (50 mLs Intravenous Given 04/03/19 1728)    ED Course  I have reviewed the triage vital signs and the nursing notes.  Pertinent labs & imaging results that were available during my care of the patient were reviewed by me and considered in my medical decision making (see chart for details).  Clinical Course as of Apr 02 1929  Mon Apr 03, 2019  1631 EKG very concerning for  hyperkalemia.  Lokelma ordered.  No IV access or labs back yet.  Will add on insulin/dextrose when have glucose level.  Calcium ordered.   EKG 12-Lead [EH]  62 Spoke with nephrology who will come see patient, requests to be notified when K is back.    [EH]  2671 Spoke with hospitalist who will admit patient.    [EH]  1749 K reportedly over 8.5.     [EH]  1813 Potassium(!!): >7.5 [EH]  1813 Anion gap(!): 26 [EH]    Clinical Course User Index [EH] Ollen Gross   MDM Rules/Calculators/A&P                     Patient is a 64 year old woman who presents today for evaluation of weakness.  She has missed 1 dialysis session recently.  On exam she has weakness in her bilateral lower extremities, more pronounced on the right than the left which has been worsening over the past 3 to 4 days according to patient. EKG was obtained prior to the labs with changes very concerning for hyperkalemia.  Treatment with calcium, dextrose, insulin, Lokelma, albuterol and bicarb was ordered empirically.  Based on patient's history, along no significant EKG changes nephrology was consulted.  I spoke with Dr. Johnney Ou who will see patient for emergent dialysis.   When labs returned her potassium is over 7.5.  CMP is also significant for a  CO2 of 17, creatinine of 16, and anion gap of 26.  CBC shows leukocytosis at 13.  Influenza A, B, and Covid PCR testing was negative.  Chest x-ray is obtained without evidence of acute abnormalities.  Repeat EKG showed improvement with narrowing of the QRS complex after treatment.   As she reported multiple falls recently CT head and neck were ordered.  Without evidence of fracture, significant intracranial hemorrhage or other significant acute abnormalities.  I suspect her weakness is caused by her significant metabolic derangements, however if they do not improve will need additional work-up.  She is outside any stroke window.  This patient was seen as a shared  visit with Dr. Wilson Singer.  I spoke with Dr. Mindi Junker who will admit the patient.    Final Clinical Impression(s) / ED Diagnoses Final diagnoses:  Hyperkalemia  Abnormal EKG  Noncompliance with renal dialysis [facility])    Rx / DC Orders ED Discharge Orders    None       Ollen Gross 04/03/19 2107    Virgel Manifold, MD 04/03/19 2329

## 2019-04-03 NOTE — ED Triage Notes (Signed)
Pt here via EMS with c/o weakness and inability to stand.  Pt states normally she walks without assistance.  Pt is a dialysis patient, last treatment was Thursday.  Pt missed Saturday tx due to oversleeping.

## 2019-04-03 NOTE — Consult Note (Signed)
[place]  Reason for Consultation: ESRD, hyperkalemia Requesting Provider: Dr. Wilson Singer  HPI: Mary Wilcox is an 64 y.o. female with ESRD on HD TTS [facility], remote h/o cervical ca who is seen for evaluation and management of severe hyperkalemia.   She missed HD Sat due to oversleeping.  Presented to ED today with weakness, EKG  With peaked T waves.  K measured > 7.5.  Receiving IV ca, IV insulin, dextrose, bicarb and lokelma in the ED.  BP 160s.  Rapid COVD and flu neg in ED.   She denies any recent illnesses, fevers, chills, GI issues, myalgias.  Feeling weak and tremulous currently.  Uses TDC for HD which she says was placed in 11/2018.  Supposed to see [facility] next week for new access planning.  Recent K checks at outpt HD > 6. She says she's eating low K diet.   PMH: Past Medical History:  Diagnosis Date  . Bilateral hydronephrosis 2006   due to obstruction from cervical cancer  . Cervical cancer ([place]) 2006   IIIB s/p ext radiation and intracavitary cessium  . Chronic kidney disease    hx several episodes of ARF secondary to obstructive uropathy  . Insomnia    takes Ambien 3x wk  . Iron deficiency anemia   . Peripheral vascular disease ([place])   . Secondary hyperparathyroidism (of renal origin)   . Transfusion history    02/2011 for Hgb 6.8   PSH: Past Surgical History:  Procedure Laterality Date  . AV FISTULA PLACEMENT  03/05/2011   Procedure: INSERTION OF ARTERIOVENOUS (AV) GORE-TEX GRAFT ARM;  Surgeon: Angelia Mould, MD;  Location: [facility];  Service: Vascular;  Laterality: Left;  start 1115   finish 1250  . INSERTION OF DIALYSIS CATHETER  03/05/2011   Procedure: INSERTION OF DIALYSIS CATHETER;  Surgeon: Angelia Mould, MD;  Location: [facility];  Service: Vascular;  Laterality: Right;  start 1041- finish 1051  . TUBAL LIGATION    . URETER SURGERY     stent, Dr. Risa Grill for bilateral hydro     Past Medical History:   Diagnosis Date  . Bilateral hydronephrosis 2006   due to obstruction from cervical cancer  . Cervical cancer ([place]) 2006   IIIB s/p ext radiation and intracavitary cessium  . Chronic kidney disease    hx several episodes of ARF secondary to obstructive uropathy  . Insomnia    takes Ambien 3x wk  . Iron deficiency anemia   . Peripheral vascular disease ([place])   . Secondary hyperparathyroidism (of renal origin)   . Transfusion history    02/2011 for Hgb 6.8    Medications:  I have reviewed the patient's current medications.  (Not in a hospital admission)   ALLERGIES:   Allergies  Allergen Reactions  . Prozac [Fluoxetine Hcl] Hives    FAM HX: Family History  Problem Relation Age of Onset  . Lung cancer Father   . Cancer Father   . Cancer Mother        kidney and thyroid  . Heart disease Brother   . Diabetes Brother   . Hyperlipidemia Brother   . Hypertension Brother   . Other Brother        varicose vein    Social History:   reports that she has never smoked. She has never used smokeless tobacco. She reports that she does not drink alcohol or use drugs.  ROS: 12 system ROS neg except per HPI  Blood pressure (!) 147/61, pulse (!) 53, temperature 98.3 F (36.8 C), temperature source Oral, resp. rate (!) 22, height 5' (1.524 m), weight 85 kg, SpO2 100 %. PHYSICAL EXAM: Gen: chronically ill appearing woman   Eyes: anicteric, Glasses ENT: Mask for COVID Neck: thick CV:  RRR, no rub Abd:  Soft, obese Lungs: clear Extr: 1+ tibial edema Neuro: cannot break gravity with legs, arms are 4/5 strength but clumsy and tremulous Skin: RIJ TDC dressing c/d/i   Results for orders placed or performed during the hospital encounter of 04/03/19 (from the past 48 hour(s))  Respiratory Panel by RT PCR (Flu A&B, Covid) - Nasopharyngeal Swab     Status: None   Collection Time: 04/03/19  4:34 PM   Specimen: Nasopharyngeal Swab  Result Value Ref Range   SARS Coronavirus 2 by RT  PCR NEGATIVE NEGATIVE    Comment: (NOTE) SARS-CoV-2 target nucleic acids are NOT DETECTED. The SARS-CoV-2 RNA is generally detectable in upper respiratoy specimens during the acute phase of infection. The lowest concentration of SARS-CoV-2 viral copies this assay can detect is 131 copies/mL. A negative result does not preclude SARS-Cov-2 infection and should not be used as the sole basis for treatment or other patient management decisions. A negative result may occur with  improper specimen collection/handling, submission of specimen other than nasopharyngeal swab, presence of viral mutation(s) within the areas targeted by this assay, and inadequate number of viral copies (<131 copies/mL). A negative result must be combined with clinical observations, patient history, and epidemiological information. The expected result is Negative. Fact Sheet for Patients:  PinkCheek.be Fact Sheet for Healthcare Providers:  GravelBags.it This test is not yet ap proved or cleared by the [place] FDA and  has been authorized for detection and/or diagnosis of SARS-CoV-2 by FDA under an Emergency Use Authorization (EUA). This EUA will remain  in effect (meaning this test can be used) for the duration of the COVID-19 declaration under Section 564(b)(1) of the Act, 21 U.S.C. section 360bbb-3(b)(1), unless the authorization is terminated or revoked sooner.    Influenza A by PCR NEGATIVE NEGATIVE   Influenza B by PCR NEGATIVE NEGATIVE    Comment: (NOTE) The Xpert Xpress SARS-CoV-2/FLU/RSV assay is intended as an aid in  the diagnosis of influenza from Nasopharyngeal swab specimens and  should not be used as a sole basis for treatment. Nasal washings and  aspirates are unacceptable for Xpert Xpress SARS-CoV-2/FLU/RSV  testing. Fact Sheet for Patients: PinkCheek.be Fact Sheet for Healthcare  Providers: GravelBags.it This test is not yet approved or cleared by the [place] FDA and  has been authorized for detection and/or diagnosis of SARS-CoV-2 by  FDA under an Emergency Use Authorization (EUA). This EUA will remain  in effect (meaning this test can be used) for the duration of the  Covid-19 declaration under Section 564(b)(1) of the Act, 21  U.S.C. section 360bbb-3(b)(1), unless the authorization is  terminated or revoked. Performed at [facility], [place] [address] [place],  [address]   Comprehensive metabolic panel     Status: Abnormal   Collection Time: 04/03/19  5:15 PM  Result Value Ref Range   Sodium 133 (L) 135 - 145 mmol/L   Potassium >7.5 (HH) 3.5 - 5.1 mmol/L    Comment: CRITICAL RESULT CALLED TO, READ BACK BY AND VERIFIED WITH: C MARSHALL,RN 1757 04/03/2019 D BRADLEY    Chloride 90 (L) 98 - 111 mmol/L   CO2 17 (L) 22 - 32 mmol/L  Glucose, Bld 75 70 - 99 mg/dL   BUN 133 (H) 8 - 23 mg/dL   Creatinine, Ser 16.69 (H) 0.44 - 1.00 mg/dL   Calcium 8.1 (L) 8.9 - 10.3 mg/dL   Total Protein 7.4 6.5 - 8.1 g/dL   Albumin 4.1 3.5 - 5.0 g/dL   AST 13 (L) 15 - 41 U/L   ALT 15 0 - 44 U/L   Alkaline Phosphatase 154 (H) 38 - 126 U/L   Total Bilirubin 0.7 0.3 - 1.2 mg/dL   GFR calc non Af Amer 2 (L) >60 mL/min   GFR calc Af Amer 2 (L) >60 mL/min   Anion gap 26 (H) 5 - 15    Comment: Performed at [facility] [address], [place], [place] [address]  CBC with Differential     Status: Abnormal   Collection Time: 04/03/19  5:15 PM  Result Value Ref Range   WBC 13.3 (H) 4.0 - 10.5 K/uL   RBC 3.96 3.87 - 5.11 MIL/uL   Hemoglobin 13.4 12.0 - 15.0 g/dL   HCT 41.1 36.0 - 46.0 %   MCV 103.8 (H) 80.0 - 100.0 fL   MCH 33.8 26.0 - 34.0 pg   MCHC 32.6 30.0 - 36.0 g/dL   RDW 15.7 (H) 11.5 - 15.5 %   Platelets 286 150 - 400 K/uL   nRBC 0.0 0.0 - 0.2 %   Neutrophils Relative % 83 %   Neutro Abs 11.2 (H) 1.7 - 7.7 K/uL    Lymphocytes Relative 10 %   Lymphs Abs 1.3 0.7 - 4.0 K/uL   Monocytes Relative 5 %   Monocytes Absolute 0.6 0.1 - 1.0 K/uL   Eosinophils Relative 1 %   Eosinophils Absolute 0.1 0.0 - 0.5 K/uL   Basophils Relative 0 %   Basophils Absolute 0.1 0.0 - 0.1 K/uL   Immature Granulocytes 1 %   Abs Immature Granulocytes 0.08 (H) 0.00 - 0.07 K/uL    Comment: Performed at [facility] [address] [place], [place] [address]  Magnesium     Status: Abnormal   Collection Time: 04/03/19  5:15 PM  Result Value Ref Range   Magnesium 2.9 (H) 1.7 - 2.4 mg/dL    Comment: Performed at [facility] [address] [place], [place] [address]    DG Chest Port 1 View  Result Date: 04/03/2019 CLINICAL DATA:  Weakness.  Missed dialysis. EXAM: PORTABLE CHEST 1 VIEW COMPARISON:  April 11, 2011 FINDINGS: There is a right internal jugular venous catheter with its distal tip noted just beyond the junction of the superior vena cava and right atrium. Very mild linear scarring and/or atelectasis is seen within the lateral aspect of the right lung base. There is no evidence of a pleural effusion or pneumothorax. The heart size and mediastinal contours are within normal limits. A radiopaque vascular stent is seen overlying the left apex and left axilla. This represents a new finding when compared to the prior exam. The visualized skeletal structures are unremarkable. IMPRESSION: 1. Mild right basilar linear scarring and/or atelectasis, without acute or active cardiopulmonary disease. Electronically Signed   By: Virgina Norfolk M.D.   On: 04/03/2019 16:28   Outpt HD: [facility] TTS 160 BFR 400 DRF 900 EDW 83.5 3hr 45 min 2K, 2.25 Ca Heparin 4000u IV qtx  Assessment/Plan **Severe hyperkalemia:  Assoc with EKG changes and I suspect the muscle weakness is also secondary.  Emergent HD now - have d/w  HD RN.  Mary Wilcox been having hyperK of late outpt -- ? Needs catheter exchange; appears several rounds of tPA have  been ordered recently for her TDC.  This could be done outpt soon.  **ESRD on HD:  HD tonight x 3 hrs and then resume TTS schedule prior to discharge. UF generally 3-3.8L.  UF 3-4L as tolerated tonight.   **Anemia: Hb 13.4. not on ESA  **BMM: Corr Ca ok.  Last outpt phos 8.2.  Last PTH 384.  Renal diet and auryxia.   **AGMA: suspect secondary to ESRD/missed HD - will correct with HD.   Justin Mend 04/03/2019, 6:46 PM

## 2019-04-03 NOTE — ED Notes (Signed)
IV team at bedside 

## 2019-04-03 NOTE — ED Notes (Signed)
Pt also states she had a fall yesterday, hitting the back of her head. Pt felt dizzy and lost balance.

## 2019-04-03 NOTE — H&P (Signed)
History and Physical    Mary Wilcox DTO:[id number] DOB: December 06, 1955 DOA: 04/03/2019  PCP: Kristen Loader, FNP  Patient coming from: Home  I have personally briefly reviewed patient's old medical records in [facility]  Chief Complaint: Weakness  HPI: Mary Wilcox is a 64 y.o. female with medical history significant for ESRD on hemodialysis Tuesday Thursday Saturday [facility], remote history of cervical cancer who presents with a chief complaint of weakness and inability to stand, found to have severe hyperkalemia.  She reports she normally walks that assistance, though missed dialysis on Saturday and today has been weak.  She reports this weakness was associated with a fall yesterday as well, reportedly mechanical involving the back of her head after she felt a little dizzy and lost balance.  No syncope, loss of consciousness or residual pain. Of note, at her regular hemodialysis site her right-sided PermCath has been intermittently malfunctioning, reportedly leading to less ultrafiltration and early discontinue dialysis sessions. Due to these complaints, she presented the emergency department today, where an EKG was obtained with peak T waves and a potassium measured at greater than 7.5. Otherwise denies fevers, chills, GI issues, myalgias.  Denies headache, vision changes, residual head pain.  Reports adherence to low potassium diet.  ED Course: On arrival to the ED pt was hemodynamically stable, temp 98.3, heart rate variable between 53 and 74, saturating well on room air. Labs and imaging studies acquired. Initial labwork notable for WBC 13.3, sodium 133, potassium greater than 7.5, bicarb 17, creatinine 16.6, BUN 133.  Anion gap 26.  Imaging studies notable for no acute cardiopulmonary disease.  CT head/C-spine awaiting final radiology read.  EKG initially sinus rhythm, rate 61, prolonged QRS of 225, with peak T waves in anterior leads.  Repeat with sinus rhythm, rate of 70,  prolonged QRS at 156 ms, peak T waves in anterior leads. Patient was administered IV calcium, IV insulin, dextrose, bicarb and Lokelma.  Nephrology consulted in ED and plan for hemodialysis tonight x3 hours and then resuming TTS schedule prior to discharge. Given concern for severe hyperkalemia, with associated metabolic abnormalities and weakness, decision was made to admit.   Review of Systems: As per HPI otherwise 10 point review of systems negative.   Past Medical History:  Diagnosis Date  . Bilateral hydronephrosis 2006   due to obstruction from cervical cancer  . Cervical cancer ([name]) 2006   IIIB s/p ext radiation and intracavitary cessium  . Chronic kidney disease    hx several episodes of ARF secondary to obstructive uropathy  . Insomnia    takes Ambien 3x wk  . Iron deficiency anemia   . Peripheral vascular disease ([place])   . Secondary hyperparathyroidism (of renal origin)   . Transfusion history    02/2011 for Hgb 6.8    Past Surgical History:  Procedure Laterality Date  . AV FISTULA PLACEMENT  03/05/2011   Procedure: INSERTION OF ARTERIOVENOUS (AV) GORE-TEX GRAFT ARM;  Surgeon: Angelia Mould, MD;  Location: [facility];  Service: Vascular;  Laterality: Left;  start 1115   finish 1250  . INSERTION OF DIALYSIS CATHETER  03/05/2011   Procedure: INSERTION OF DIALYSIS CATHETER;  Surgeon: Angelia Mould, MD;  Location: [facility];  Service: Vascular;  Laterality: Right;  start 1041- finish 1051  . TUBAL LIGATION    . URETER SURGERY     stent, Dr. Risa Grill for bilateral hydro     reports that she has never smoked. She has  never used smokeless tobacco. She reports that she does not drink alcohol or use drugs.  Allergies  Allergen Reactions  . Prozac [Fluoxetine Hcl] Hives    Family History  Problem Relation Age of Onset  . Lung cancer Father   . Cancer Father   . Cancer Mother        kidney and thyroid  . Heart disease Brother   . Diabetes Brother   .  Hyperlipidemia Brother   . Hypertension Brother   . Other Brother        varicose vein    Prior to Admission medications   Medication Sig Start Date End Date Taking? Authorizing Provider  acetaminophen (TYLENOL) 500 MG tablet Take 1,000 mg by mouth every 6 (six) hours as needed for headache (pain).    [provider]  B Complex-C (SUPER B COMPLEX PO) Take 1 tablet by mouth daily.    [provider]  calcium acetate (PHOSLO) 667 MG capsule Take 667 mg by mouth See admin instructions. Take one capsule (667 mg) by mouth three times daily with meals and 2-3 times with snacks. 06/18/11   [provider]  cinacalcet (SENSIPAR) 90 MG tablet Take 90 mg by mouth at bedtime.    [provider]  ferric citrate (AURYXIA) 1 GM 210 MG(Fe) tablet Take 210-420 mg by mouth See admin instructions. Take two tablets (420 mg) by mouth three times daily with meals and one tablet (210 mg) up to 3 times daily with snacks    [provider]  gabapentin (NEURONTIN) 100 MG capsule Take 100 mg by mouth at bedtime. For leg pain 08/06/18   [provider]  levothyroxine (SYNTHROID) 25 MCG tablet Take 25 mcg by mouth daily before breakfast. 08/10/18   [provider]  midodrine (PROAMATINE) 10 MG tablet Take 10 mg by mouth See admin instructions. Take one tablet (10 mg) by mouth twice on dialysis days (Tuesday, Thursday, Saturday) at 4am and 8am 09/11/18   [provider]  sertraline (ZOLOFT) 100 MG tablet Take 100 mg by mouth daily. 09/17/18   [provider]  vitamin B-12 (CYANOCOBALAMIN) 1000 MCG tablet Take 1,000 mcg by mouth daily.    [provider]  zolpidem (AMBIEN) 10 MG tablet Take 10 mg by mouth at bedtime as needed for sleep.     [provider]    Physical Exam: Vitals:   04/03/19 1740 04/03/19 1800 04/03/19 1838 04/03/19 1845  BP: (!) 147/61 131/71  106/80  Pulse: (!) 53 74 69 69  Resp: (!) 22 18 20 18   Temp:       TempSrc:      SpO2: 100% 100% 100% 100%  Weight:      Height:       Constitutional: NAD, calm, comfortable Eyes: PERRL, lids and conjunctivae normal ENMT: Mucous membranes are moist. Posterior pharynx clear of any exudate or lesions. Neck: normal, supple, no masses, no thyromegaly Respiratory: clear to auscultation bilaterally, no wheezing, no crackles. Normal respiratory effort. No accessory muscle use.  Right permacath noted on chest, clean/dry, no tenderness. Cardiovascular: Regular rate and rhythm, no murmurs / rubs / gallops. No extremity edema. 2+ pedal pulses. No carotid bruits.  Abdomen: no tenderness, no masses palpated. No hepatosplenomegaly. Bowel sounds positive.  Musculoskeletal: no clubbing / cyanosis. No joint deformity upper and lower extremities. Good ROM, no contractures. Normal muscle tone.  Skin: no rashes, lesions, ulcers. No induration Neurologic: CN 2-12 grossly intact. Sensation intact. Strength 5/5 in  all 4.  Psychiatric: Normal judgment and insight. Alert and oriented x 3. Normal mood.   (Anything < 9 systems with 2 bullets each down codes to level 1) (If patient refuses exam can't bill higher level) (Make sure to document decubitus ulcers present on admission -- if possible -- and whether patient has chronic indwelling catheter at time of admission)  Labs on Admission: I have personally reviewed following labs and imaging studies  CBC: Recent Labs  Lab 04/03/19 1715  WBC 13.3*  NEUTROABS 11.2*  HGB 13.4  HCT 41.1  MCV 103.8*  PLT 161   Basic Metabolic Panel: Recent Labs  Lab 04/03/19 1715  NA 133*  K >7.5*  CL 90*  CO2 17*  GLUCOSE 75  BUN 133*  CREATININE 16.69*  CALCIUM 8.1*  MG 2.9*   GFR: Estimated Creatinine Clearance: 3.3 mL/min (A) (by C-G formula based on SCr of 16.69 mg/dL (H)). Liver Function Tests: Recent Labs  Lab 04/03/19 1715  AST 13*  ALT 15  ALKPHOS 154*  BILITOT 0.7  PROT 7.4  ALBUMIN 4.1   No results for  input(s): LIPASE, AMYLASE in the last 168 hours. No results for input(s): AMMONIA in the last 168 hours. Coagulation Profile: No results for input(s): INR, PROTIME in the last 168 hours. Cardiac Enzymes: No results for input(s): CKTOTAL, CKMB, CKMBINDEX, TROPONINI in the last 168 hours. BNP (last 3 results) No results for input(s): PROBNP in the last 8760 hours. HbA1C: No results for input(s): HGBA1C in the last 72 hours. CBG: No results for input(s): GLUCAP in the last 168 hours. Lipid Profile: No results for input(s): CHOL, HDL, LDLCALC, TRIG, CHOLHDL, LDLDIRECT in the last 72 hours. Thyroid Function Tests: No results for input(s): TSH, T4TOTAL, FREET4, T3FREE, THYROIDAB in the last 72 hours. Anemia Panel: No results for input(s): VITAMINB12, FOLATE, FERRITIN, TIBC, IRON, RETICCTPCT in the last 72 hours. Urine analysis:    Component Value Date/Time   COLORURINE YELLOW 04/12/2011 0335   APPEARANCEUR TURBID (A) 04/12/2011 0335   LABSPEC 1.008 04/12/2011 0335   PHURINE 8.5 (H) 04/12/2011 0335   GLUCOSEU NEGATIVE 04/12/2011 0335   HGBUR LARGE (A) 04/12/2011 0335   BILIRUBINUR NEGATIVE 04/12/2011 0335   KETONESUR NEGATIVE 04/12/2011 0335   PROTEINUR 100 (A) 04/12/2011 0335   UROBILINOGEN 0.2 04/12/2011 0335   NITRITE NEGATIVE 04/12/2011 0335   LEUKOCYTESUR LARGE (A) 04/12/2011 0335    Radiological Exams on Admission: DG Chest Port 1 View  Result Date: 04/03/2019 CLINICAL DATA:  Weakness.  Missed dialysis. EXAM: PORTABLE CHEST 1 VIEW COMPARISON:  April 11, 2011 FINDINGS: There is a right internal jugular venous catheter with its distal tip noted just beyond the junction of the superior vena cava and right atrium. Very mild linear scarring and/or atelectasis is seen within the lateral aspect of the right lung base. There is no evidence of a pleural effusion or pneumothorax. The heart size and mediastinal contours are within normal limits. A radiopaque vascular stent is seen  overlying the left apex and left axilla. This represents a new finding when compared to the prior exam. The visualized skeletal structures are unremarkable. IMPRESSION: 1. Mild right basilar linear scarring and/or atelectasis, without acute or active cardiopulmonary disease. Electronically Signed   By: Virgina Norfolk M.D.   On: 04/03/2019 16:28    EKG: Independently reviewed.  Initial with sinus rhythm, rate 61, QRS duration of 225 with peaked T waves anteriorly, repeat with QRS duration improved to 156, otherwise similar.  Assessment/Plan Active Problems:  Hyperkalemia   ESRD Severe hyperkalemia Uremia Anion gap metabolic acidosis Hypermagnesemia -Presents after missing HD x1 day and recent permacath malfunction at normal hemodialysis sessions, found to have weakness with associated hyperkalemia greater than 7.5, with EKG changes of peaked T waves, as well as BUN significantly above her baseline and an associated anion gap metabolic acidosis. -EKG changes noted on arrival with potassium greater than 7.5 status post temporization in the ED and subsequently taken to dialysis -Nephrology consult in ED, appreciate recommendations plan for HD today x3 hours then resume Tuesday Thursday Saturday via right PermCath schedule prior to discharge.  UF generally 3 to 3.8 L. -If persistent issues with dialysis access, may require permacath replacement -Renal diet, avoid nephrotoxins, daily weights, strict I's and -Every 6hr BMPs for close potassium monitoring, may require repeat temporization pending hemodialysis response -Daily mag/Phos -Continue home cinacalcet, midodrine on dialysis days   Weakness Patient reports to 3 days weakness associate with a mechanical fall yesterday.  Does not appear to have sustained any injuries. Anticipate improvement with metabolic derangement correction including correction of her hyperkalemia as well as her uremia/anion gap metabolic acidosis, the patient may  benefit from additional physical therapy if remains inpatient. -Follow-up final read of CT head/C-spine  Anemia of chronic disease -Hemoglobin 13.4, currently at baseline -Continue home Auryxia  DVT prophylaxis: Heparin/SCDs Code Status: Full code Family Communication: None present at bedside Disposition Plan: Admit inpatient, likely discharge home in 1 to 2 days Consults called: Nephrology consult by EDP, Dr. Johnney Ou Admission status: Admit under observation, telemetry for hyperkalemia   [facility] Pager [phone]  If 7PM-7AM, please contact night-coverage [email or web address] Password [email or web address]  04/03/2019, 7:22 PM

## 2019-04-03 NOTE — ED Provider Notes (Signed)
Medical screening examination/treatment/procedure(s) were conducted as a shared visit with non-physician practitioner(s) and myself.  I personally evaluated the patient during the encounter.  63yF with generalized weakness. EKG markedly changed from recent. Presumably hyperkalemic. ESRD. Last HD was Thursday. Albuterol/bicarb/insulin/Ca/lokelma.   Not sure how much of her weakness is related to metabolic derangement versus possibly some other process. Does seem to be focally worse in RLE. Symptoms for days at this point. Outside of window for acute intervention if CVA. Will need nephrology consultation. Admit.   CRITICAL CARE Performed by: Virgel Manifold Total critical care time: 35 minutes Critical care time was exclusive of separately billable procedures and treating other patients. Critical care was necessary to treat or prevent imminent or life-threatening deterioration. Critical care was time spent personally by me on the following activities: development of treatment plan with patient and/or surrogate as well as nursing, discussions with consultants, evaluation of patient's response to treatment, examination of patient, obtaining history from patient or surrogate, ordering and performing treatments and interventions, ordering and review of laboratory studies, ordering and review of radiographic studies, pulse oximetry and re-evaluation of patient's condition.  Angiocath insertion Performed by: Virgel Manifold  Consent: Verbal consent obtained. Risks and benefits: risks, benefits and alternatives were discussed Time out: Immediately prior to procedure a "time out" was called to verify the correct patient, procedure, equipment, support staff and site/side marked as required.  Preparation: Patient was prepped and draped in the usual sterile fashion.  Vein Location: L AC  Ultrasound Guided  Gauge: 20  Normal blood return and flush without difficulty Patient tolerance: Patient tolerated  the procedure well with no immediate complications.        Virgel Manifold, MD 04/03/19 1745

## 2019-04-04 DIAGNOSIS — Z87440 Personal history of urinary (tract) infections: Secondary | ICD-10-CM | POA: Diagnosis not present

## 2019-04-04 DIAGNOSIS — D638 Anemia in other chronic diseases classified elsewhere: Secondary | ICD-10-CM | POA: Diagnosis present

## 2019-04-04 DIAGNOSIS — Z833 Family history of diabetes mellitus: Secondary | ICD-10-CM | POA: Diagnosis not present

## 2019-04-04 DIAGNOSIS — Z992 Dependence on renal dialysis: Secondary | ICD-10-CM | POA: Diagnosis not present

## 2019-04-04 DIAGNOSIS — M545 Low back pain: Secondary | ICD-10-CM | POA: Diagnosis present

## 2019-04-04 DIAGNOSIS — I739 Peripheral vascular disease, unspecified: Secondary | ICD-10-CM | POA: Diagnosis present

## 2019-04-04 DIAGNOSIS — W1830XA Fall on same level, unspecified, initial encounter: Secondary | ICD-10-CM | POA: Diagnosis present

## 2019-04-04 DIAGNOSIS — L89136 Pressure-induced deep tissue damage of right lower back: Secondary | ICD-10-CM | POA: Diagnosis not present

## 2019-04-04 DIAGNOSIS — Z79890 Hormone replacement therapy (postmenopausal): Secondary | ICD-10-CM | POA: Diagnosis not present

## 2019-04-04 DIAGNOSIS — Z888 Allergy status to other drugs, medicaments and biological substances status: Secondary | ICD-10-CM | POA: Diagnosis not present

## 2019-04-04 DIAGNOSIS — Z79899 Other long term (current) drug therapy: Secondary | ICD-10-CM | POA: Diagnosis not present

## 2019-04-04 DIAGNOSIS — E872 Acidosis: Secondary | ICD-10-CM | POA: Diagnosis present

## 2019-04-04 DIAGNOSIS — N2581 Secondary hyperparathyroidism of renal origin: Secondary | ICD-10-CM | POA: Diagnosis present

## 2019-04-04 DIAGNOSIS — L8990 Pressure ulcer of unspecified site, unspecified stage: Secondary | ICD-10-CM | POA: Insufficient documentation

## 2019-04-04 DIAGNOSIS — N186 End stage renal disease: Secondary | ICD-10-CM | POA: Diagnosis present

## 2019-04-04 DIAGNOSIS — R296 Repeated falls: Secondary | ICD-10-CM | POA: Diagnosis present

## 2019-04-04 DIAGNOSIS — Z8541 Personal history of malignant neoplasm of cervix uteri: Secondary | ICD-10-CM | POA: Diagnosis not present

## 2019-04-04 DIAGNOSIS — L89321 Pressure ulcer of left buttock, stage 1: Secondary | ICD-10-CM | POA: Diagnosis present

## 2019-04-04 DIAGNOSIS — S3992XA Unspecified injury of lower back, initial encounter: Secondary | ICD-10-CM | POA: Diagnosis not present

## 2019-04-04 DIAGNOSIS — E669 Obesity, unspecified: Secondary | ICD-10-CM | POA: Diagnosis present

## 2019-04-04 DIAGNOSIS — G4700 Insomnia, unspecified: Secondary | ICD-10-CM | POA: Diagnosis present

## 2019-04-04 DIAGNOSIS — Z923 Personal history of irradiation: Secondary | ICD-10-CM | POA: Diagnosis not present

## 2019-04-04 DIAGNOSIS — L89311 Pressure ulcer of right buttock, stage 1: Secondary | ICD-10-CM | POA: Diagnosis present

## 2019-04-04 DIAGNOSIS — E875 Hyperkalemia: Secondary | ICD-10-CM | POA: Diagnosis present

## 2019-04-04 DIAGNOSIS — Z20822 Contact with and (suspected) exposure to covid-19: Secondary | ICD-10-CM | POA: Diagnosis present

## 2019-04-04 DIAGNOSIS — Z8249 Family history of ischemic heart disease and other diseases of the circulatory system: Secondary | ICD-10-CM | POA: Diagnosis not present

## 2019-04-04 DIAGNOSIS — D631 Anemia in chronic kidney disease: Secondary | ICD-10-CM | POA: Diagnosis present

## 2019-04-04 DIAGNOSIS — Z9115 Patient's noncompliance with renal dialysis: Secondary | ICD-10-CM | POA: Diagnosis not present

## 2019-04-04 LAB — CBC
HCT: 34.5 % — ABNORMAL LOW (ref 36.0–46.0)
HCT: 34.6 % — ABNORMAL LOW (ref 36.0–46.0)
Hemoglobin: 11.2 g/dL — ABNORMAL LOW (ref 12.0–15.0)
Hemoglobin: 11.3 g/dL — ABNORMAL LOW (ref 12.0–15.0)
MCH: 33.3 pg (ref 26.0–34.0)
MCH: 33.6 pg (ref 26.0–34.0)
MCHC: 32.5 g/dL (ref 30.0–36.0)
MCHC: 32.7 g/dL (ref 30.0–36.0)
MCV: 102.7 fL — ABNORMAL HIGH (ref 80.0–100.0)
MCV: 103 fL — ABNORMAL HIGH (ref 80.0–100.0)
Platelets: 181 10*3/uL (ref 150–400)
Platelets: 189 10*3/uL (ref 150–400)
RBC: 3.36 MIL/uL — ABNORMAL LOW (ref 3.87–5.11)
RBC: 3.36 MIL/uL — ABNORMAL LOW (ref 3.87–5.11)
RDW: 15.7 % — ABNORMAL HIGH (ref 11.5–15.5)
RDW: 15.7 % — ABNORMAL HIGH (ref 11.5–15.5)
WBC: 8.5 10*3/uL (ref 4.0–10.5)
WBC: 8.5 10*3/uL (ref 4.0–10.5)
nRBC: 0 % (ref 0.0–0.2)
nRBC: 0 % (ref 0.0–0.2)

## 2019-04-04 LAB — RENAL FUNCTION PANEL
Albumin: 3 g/dL — ABNORMAL LOW (ref 3.5–5.0)
Anion gap: 15 (ref 5–15)
BUN: 57 mg/dL — ABNORMAL HIGH (ref 8–23)
CO2: 25 mmol/L (ref 22–32)
Calcium: 8 mg/dL — ABNORMAL LOW (ref 8.9–10.3)
Chloride: 95 mmol/L — ABNORMAL LOW (ref 98–111)
Creatinine, Ser: 10.38 mg/dL — ABNORMAL HIGH (ref 0.44–1.00)
GFR calc Af Amer: 4 mL/min — ABNORMAL LOW (ref >60)
GFR calc non Af Amer: 3 mL/min — ABNORMAL LOW (ref >60)
Glucose, Bld: 99 mg/dL (ref 70–99)
Phosphorus: 7.9 mg/dL — ABNORMAL HIGH (ref 2.5–4.6)
Potassium: 6 mmol/L — ABNORMAL HIGH (ref 3.5–5.1)
Sodium: 135 mmol/L (ref 135–145)

## 2019-04-04 LAB — HEPATITIS B SURFACE ANTIGEN: Hepatitis B Surface Ag: NONREACTIVE

## 2019-04-04 LAB — PHOSPHORUS: Phosphorus: 7.7 mg/dL — ABNORMAL HIGH (ref 2.5–4.6)

## 2019-04-04 LAB — MAGNESIUM: Magnesium: 2 mg/dL (ref 1.7–2.4)

## 2019-04-04 MED ORDER — LIDOCAINE-PRILOCAINE 2.5-2.5 % EX CREA: 1.0000 "application " | TOPICAL_CREAM | CUTANEOUS | Status: DC | PRN

## 2019-04-04 MED ORDER — HEPARIN SODIUM (PORCINE) 1000 UNIT/ML IJ SOLN
INTRAMUSCULAR | Status: AC
  Administered 2019-04-04: 4000 [IU] via INTRAVENOUS_CENTRAL
  Filled 2019-04-04: qty 4

## 2019-04-04 MED ORDER — FERRIC CITRATE 1 GM 210 MG(FE) PO TABS: 210.0000 mg | ORAL_TABLET | ORAL | Status: DC | PRN

## 2019-04-04 MED ORDER — MIDODRINE HCL 5 MG PO TABS
10.0000 mg | ORAL_TABLET | ORAL | Status: DC
  Administered 2019-04-04 (×2): 5 mg via ORAL
  Administered 2019-04-06 (×2): 10 mg via ORAL
  Filled 2019-04-04 (×5): qty 2

## 2019-04-04 MED ORDER — DICLOFENAC SODIUM 1 % EX GEL
2.0000 g | Freq: Four times a day (QID) | CUTANEOUS | Status: DC
  Administered 2019-04-04 – 2019-04-06 (×10): 2 g via TOPICAL
  Filled 2019-04-04: qty 100

## 2019-04-04 MED ORDER — FERRIC CITRATE 1 GM 210 MG(FE) PO TABS
420.0000 mg | ORAL_TABLET | Freq: Three times a day (TID) | ORAL | Status: DC
  Administered 2019-04-04 – 2019-04-05 (×4): 420 mg via ORAL
  Filled 2019-04-04 (×4): qty 2

## 2019-04-04 MED ORDER — SODIUM CHLORIDE 0.9 % IV SOLN: 100.0000 mL | INTRAVENOUS | Status: DC | PRN

## 2019-04-04 MED ORDER — CALCIUM ACETATE (PHOS BINDER) 667 MG PO CAPS: 667.0000 mg | ORAL_CAPSULE | ORAL | Status: DC | PRN

## 2019-04-04 MED ORDER — ACETAMINOPHEN 325 MG PO TABS
650.0000 mg | ORAL_TABLET | Freq: Four times a day (QID) | ORAL | Status: DC | PRN
  Administered 2019-04-04 – 2019-04-06 (×3): 650 mg via ORAL
  Filled 2019-04-04 (×2): qty 2

## 2019-04-04 MED ORDER — VITAMIN B-12 1000 MCG PO TABS
1000.0000 ug | ORAL_TABLET | Freq: Every day | ORAL | Status: DC
  Administered 2019-04-04 – 2019-04-07 (×4): 1000 ug via ORAL
  Filled 2019-04-04 (×4): qty 1

## 2019-04-04 MED ORDER — HEPARIN SODIUM (PORCINE) 1000 UNIT/ML DIALYSIS: 1000.0000 [IU] | INTRAMUSCULAR | Status: DC | PRN

## 2019-04-04 MED ORDER — PENTAFLUOROPROP-TETRAFLUOROETH EX AERO: 1.0000 "application " | INHALATION_SPRAY | CUTANEOUS | Status: DC | PRN

## 2019-04-04 MED ORDER — ONDANSETRON HCL 4 MG/2ML IJ SOLN: 4.0000 mg | Freq: Four times a day (QID) | INTRAMUSCULAR | Status: DC | PRN

## 2019-04-04 MED ORDER — CINACALCET HCL 30 MG PO TABS
90.0000 mg | ORAL_TABLET | Freq: Every day | ORAL | Status: DC
  Administered 2019-04-04 – 2019-04-06 (×4): 90 mg via ORAL
  Filled 2019-04-04 (×5): qty 3

## 2019-04-04 MED ORDER — HEPARIN SODIUM (PORCINE) 1000 UNIT/ML IJ SOLN
INTRAMUSCULAR | Status: AC
  Administered 2019-04-04: 3200 [IU] via INTRAVENOUS_CENTRAL
  Filled 2019-04-04: qty 4

## 2019-04-04 MED ORDER — ALTEPLASE 2 MG IJ SOLR: 2.0000 mg | Freq: Once | INTRAMUSCULAR | Status: DC | PRN

## 2019-04-04 MED ORDER — ONDANSETRON HCL 4 MG PO TABS: 4.0000 mg | ORAL_TABLET | Freq: Four times a day (QID) | ORAL | Status: DC | PRN

## 2019-04-04 MED ORDER — POLYETHYLENE GLYCOL 3350 17 G PO PACK: 17.0000 g | PACK | Freq: Every day | ORAL | Status: DC | PRN

## 2019-04-04 MED ORDER — HEPARIN SODIUM (PORCINE) 1000 UNIT/ML DIALYSIS: 4000.0000 [IU] | Freq: Once | INTRAMUSCULAR | Status: AC

## 2019-04-04 MED ORDER — LIDOCAINE HCL (PF) 1 % IJ SOLN: 5.0000 mL | INTRAMUSCULAR | Status: DC | PRN

## 2019-04-04 MED ORDER — CALCIUM ACETATE (PHOS BINDER) 667 MG PO CAPS
667.0000 mg | ORAL_CAPSULE | Freq: Three times a day (TID) | ORAL | Status: DC
  Administered 2019-04-04 – 2019-04-07 (×11): 667 mg via ORAL
  Filled 2019-04-04 (×12): qty 1

## 2019-04-04 MED ORDER — SERTRALINE HCL 100 MG PO TABS
100.0000 mg | ORAL_TABLET | Freq: Every day | ORAL | Status: DC
  Administered 2019-04-04 – 2019-04-07 (×4): 100 mg via ORAL
  Filled 2019-04-04 (×4): qty 1

## 2019-04-04 MED ORDER — GABAPENTIN 100 MG PO CAPS
100.0000 mg | ORAL_CAPSULE | Freq: Every day | ORAL | Status: DC
  Administered 2019-04-04 – 2019-04-06 (×4): 100 mg via ORAL
  Filled 2019-04-04 (×4): qty 1

## 2019-04-04 MED ORDER — LEVOTHYROXINE SODIUM 25 MCG PO TABS
25.0000 ug | ORAL_TABLET | Freq: Every day | ORAL | Status: DC
  Administered 2019-04-04 – 2019-04-07 (×4): 25 ug via ORAL
  Filled 2019-04-04 (×4): qty 1

## 2019-04-04 MED ORDER — HEPARIN SODIUM (PORCINE) 5000 UNIT/ML IJ SOLN
5000.0000 [IU] | Freq: Three times a day (TID) | INTRAMUSCULAR | Status: DC
  Administered 2019-04-04 – 2019-04-07 (×8): 5000 [IU] via SUBCUTANEOUS
  Filled 2019-04-04 (×9): qty 1

## 2019-04-04 MED ORDER — ACETAMINOPHEN 650 MG RE SUPP: 650.0000 mg | Freq: Four times a day (QID) | RECTAL | Status: DC | PRN

## 2019-04-04 NOTE — Evaluation (Signed)
Physical Therapy Evaluation Patient Details Name: Mary Wilcox MRN: [id number] DOB: [birth year] Today's Date: 04/04/2019   History of Present Illness  Pt is a 64 y/o female admitted secondary to weakness and fall. Found to be hyperkalemic. CT of head and Cspine negative for acute abnormality. PMH includes ESRD on HD TTS, cervical cancer, PVD, and s/p L AV fistula placement.   Clinical Impression  Pt admitted secondary to problem above with deficits below. Pt performed standing X2 and required mod A. Pt with R knee buckling and weakness, and was only able to tolerate side stepping at EOB using RW with mod A. Feel pt would benefit from SNF level therapies at d/c as she lives alone and has a flight of stairs to enter. Will continue to follow acutely to maximize functional mobility independence and safety.     Follow Up Recommendations SNF;Supervision/Assistance - 24 hour    Equipment Recommendations  Other (comment)(TBD)    Recommendations for Other Services       Precautions / Restrictions Precautions Precautions: Fall Restrictions Weight Bearing Restrictions: No      Mobility  Bed Mobility Overal bed mobility: Needs Assistance Bed Mobility: Supine to Sit;Sit to Supine     Supine to sit: Mod assist Sit to supine: Supervision   General bed mobility comments: Mod A for trunk elevation. Increased time required to come to sitting.   Transfers Overall transfer level: Needs assistance Equipment used: Rolling walker (2 wheeled) Transfers: Sit to/from Stand Sit to Stand: Mod assist         General transfer comment: Mod A for lift assist and steadying. Stood X 2 for clean up following BM. Pt only able to tolerate short period in standing.   Ambulation/Gait Ambulation/Gait assistance: Mod assist   Assistive device: Rolling walker (2 wheeled)   Gait velocity: Decreased   General Gait Details: Took side steps at EOB. Noted RLE buckling and pt reports weakness of RLE.  Unable to safely perform further gait.   Stairs            Wheelchair Mobility    Modified Rankin (Stroke Patients Only)       Balance Overall balance assessment: Needs assistance Sitting-balance support: No upper extremity supported;Feet supported Sitting balance-Leahy Scale: Fair     Standing balance support: Bilateral upper extremity supported;During functional activity Standing balance-Leahy Scale: Poor Standing balance comment: Reliant on BUE support                              Pertinent Vitals/Pain Pain Assessment: No/denies pain    Home Living Family/patient expects to be discharged to:: Private residence Living Arrangements: Alone Available Help at Discharge: Family;Available PRN/intermittently Type of Home: Apartment Home Access: Stairs to enter Entrance Stairs-Rails: Right;Left;Can reach both Entrance Stairs-Number of Steps: flight Home Layout: One level Home Equipment: Cane - single point      Prior Function Level of Independence: Independent         Comments: Uses [employer] for transportation. Does her own cooking, cleaning, and has been using online grocery delivery     Hand Dominance        Extremity/Trunk Assessment   Upper Extremity Assessment Upper Extremity Assessment: Defer to OT evaluation    Lower Extremity Assessment Lower Extremity Assessment: RLE deficits/detail RLE Deficits / Details: Functional weakness noted. R knee buckling when taking steps.     Cervical / Trunk Assessment Cervical / Trunk Assessment: Normal  Communication  Communication: No difficulties  Cognition Arousal/Alertness: Awake/alert Behavior During Therapy: WFL for tasks assessed/performed Overall Cognitive Status: Within Functional Limits for tasks assessed                                        General Comments General comments (skin integrity, edema, etc.): Educated about SNF recommendations, however, pt reports she does  not want to go secondary to [place].     Exercises     Assessment/Plan    PT Assessment Patient needs continued PT services  PT Problem List Decreased strength;Decreased balance;Decreased mobility;Decreased knowledge of use of DME       PT Treatment Interventions Gait training;DME instruction;Stair training;Functional mobility training;Therapeutic activities;Therapeutic exercise;Balance training;Patient/family education    PT Goals (Current goals can be found in the Care Plan section)  Acute Rehab PT Goals Patient Stated Goal: to get stronger and go home PT Goal Formulation: With patient Time For Goal Achievement: 04/18/19 Potential to Achieve Goals: Good    Frequency Min 3X/week   Barriers to discharge Decreased caregiver support Pt lives alone    Co-evaluation               AM-PAC PT "6 Clicks" Mobility  Outcome Measure Help needed turning from your back to your side while in a flat bed without using bedrails?: A Lot Help needed moving from lying on your back to sitting on the side of a flat bed without using bedrails?: A Lot Help needed moving to and from a bed to a chair (including a wheelchair)?: A Lot Help needed standing up from a chair using your arms (e.g., wheelchair or bedside chair)?: A Lot Help needed to walk in hospital room?: A Lot Help needed climbing 3-5 steps with a railing? : Total 6 Click Score: 11    End of Session Equipment Utilized During Treatment: Gait belt Activity Tolerance: Patient tolerated treatment well Patient left: in bed;with call bell/phone within reach Nurse Communication: Mobility status;Other (comment)(pt had BM ) PT Visit Diagnosis: Muscle weakness (generalized) (M62.81);Unsteadiness on feet (R26.81);History of falling (Z91.81);Difficulty in walking, not elsewhere classified (R26.2)    Time: 1030-1053 PT Time Calculation (min) (ACUTE ONLY): 23 min   Charges:   PT Evaluation $PT Eval Moderate Complexity: 1 Mod PT  Treatments $Therapeutic Activity: 8-22 mins        Leighton Ruff, PT, DPT  Acute Rehabilitation Services  Pager: [phone] Office: [phone]   Rudean Hitt 04/04/2019, 11:05 AM

## 2019-04-04 NOTE — Progress Notes (Signed)
[facility] Progress Note   64 y.o. female with ESRD on HD TTS [facility], remote h/o cervical ca who is seen for evaluation and management of severe hyperkalemia w/ peaked T waves.    Receiving IV ca, IV insulin, dextrose, bicarb and lokelma in the ED.  Rapid COVID and flu neg in ED.   Outpt HD: [facility] TTS 160 BFR 400 DRF 900 EDW 83.5 3hr 45 min 2K, 2.25 Ca Heparin 4000u IV qtx  Assessment/ Plan:   1) Hyperkalemia:   rx acute HD last night.  2) ESRD on HD:   Seen on HD to  resume TTS schedule 2K RIJ TC 3.5L (net 3L)  3) Anemia: Hb 13.4. not on ESA  4) BMM: Corr Ca ok.  Last outpt phos 8.2.  Last PTH 384.  Renal diet and auryxia.    Subjective:   Doesn't feel well with weakness and myalgias; denies f/c.    Objective:   BP 131/72 (BP Location: Left Arm)   Pulse 62   Temp (!) 97.3 F (36.3 C)   Resp 20   Ht 5' (1.524 m)   Wt 90.2 kg   SpO2 99%   BMI 38.84 kg/m   Intake/Output Summary (Last 24 hours) at 04/04/2019 1416 Last data filed at 04/04/2019 0700 Gross per 24 hour  Intake 240 ml  Output 1942 ml  Net -1702 ml   Weight change:   Physical Exam: Gen: chronically ill appearing woman   Neck: thick CV:  RRR, no rub Abd:  Soft, obese Lungs: clear Extr: 1+ tibial edema Skin: RIJ TDC dressing c/d/i  Imaging: CT Head Wo Contrast  Result Date: 04/03/2019 CLINICAL DATA:  Status post trauma. EXAM: CT HEAD WITHOUT CONTRAST TECHNIQUE: Contiguous axial images were obtained from the base of the skull through the vertex without intravenous contrast. COMPARISON:  None. FINDINGS: Brain: There is mild cerebral atrophy with widening of the extra-axial spaces and ventricular dilatation. There are areas of decreased attenuation within the white matter tracts of the supratentorial brain, consistent with microvascular disease changes. Vascular: No hyperdense vessel or unexpected calcification. Skull: Normal. Negative for fracture or focal lesion.  Sinuses/Orbits: No acute finding. Other: None. IMPRESSION: No acute intracranial pathology. Electronically Signed   By: Virgina Norfolk M.D.   On: 04/03/2019 19:19   CT Cervical Spine Wo Contrast  Result Date: 04/03/2019 CLINICAL DATA:  Status post trauma. EXAM: CT CERVICAL SPINE WITHOUT CONTRAST TECHNIQUE: Multidetector CT imaging of the cervical spine was performed without intravenous contrast. Multiplanar CT image reconstructions were also generated. COMPARISON:  None. FINDINGS: Alignment: Normal. Skull base and vertebrae: No acute fracture. No primary bone lesion or focal pathologic process. Soft tissues and spinal canal: No prevertebral fluid or swelling. No visible canal hematoma. Disc levels: C2-3: Normal endplates. Very mild disc space narrowing is seen. Bilateral facet hypertrophy is noted. Normal central canal and intervertebral neuroforamina. C3-4: Normal endplates. Very mild disc space narrowing is seen. Bilateral facet hypertrophy is noted. Normal central canal and intervertebral neuroforamina. C4-5: Normal endplates. Mild disc space narrowing is seen. Bilateral facet hypertrophy is noted. Normal central canal and intervertebral neuroforamina. C5-6: Normal endplates. Mild disc space narrowing is seen. Bilateral facet hypertrophy is noted. Normal central canal and intervertebral neuroforamina. C6-7: Very mild end plate spondylosis. Mild disc space narrowing is seen. Bilateral facet hypertrophy is noted. Normal central canal and intervertebral neuroforamina. C7-T1: Normal endplates. Very mild disc space narrowing is seen. Bilateral facet hypertrophy is noted. Normal central canal and  intervertebral neuroforamina. Upper chest: Negative. Other: None. IMPRESSION: 1. Mild degenerative changes without evidence of acute osseous abnormality within the cervical spine. Electronically Signed   By: Virgina Norfolk M.D.   On: 04/03/2019 19:27   DG Chest Port 1 View  Result Date: 04/03/2019 CLINICAL DATA:   Weakness.  Missed dialysis. EXAM: PORTABLE CHEST 1 VIEW COMPARISON:  April 11, 2011 FINDINGS: There is a right internal jugular venous catheter with its distal tip noted just beyond the junction of the superior vena cava and right atrium. Very mild linear scarring and/or atelectasis is seen within the lateral aspect of the right lung base. There is no evidence of a pleural effusion or pneumothorax. The heart size and mediastinal contours are within normal limits. A radiopaque vascular stent is seen overlying the left apex and left axilla. This represents a new finding when compared to the prior exam. The visualized skeletal structures are unremarkable. IMPRESSION: 1. Mild right basilar linear scarring and/or atelectasis, without acute or active cardiopulmonary disease. Electronically Signed   By: Virgina Norfolk M.D.   On: 04/03/2019 16:28    Labs: BMET Recent Labs  Lab 04/03/19 1715 04/03/19 2312 04/04/19 0817  NA 133*  --   --   K >7.5* 4.2  --   CL 90*  --   --   CO2 17*  --   --   GLUCOSE 75  --   --   BUN 133*  --   --   CREATININE 16.69*  --   --   CALCIUM 8.1*  --   --   PHOS  --   --  7.7*   CBC Recent Labs  Lab 04/03/19 1715 04/04/19 0817  WBC 13.3* 8.5  8.5  NEUTROABS 11.2*  --   HGB 13.4 11.2*  11.3*  HCT 41.1 34.5*  34.6*  MCV 103.8* 102.7*  103.0*  PLT 286 189  181    Medications:    . calcium acetate  667 mg Oral TID WC  . Chlorhexidine Gluconate Cloth  6 each Topical Q0600  . cinacalcet  90 mg Oral QHS  . diclofenac Sodium  2 g Topical QID  . ferric citrate  420 mg Oral TID WC  . ferric citrate  420 mg Oral TID WC  . gabapentin  100 mg Oral QHS  . heparin      . heparin  4,000 Units Dialysis Once in dialysis  . heparin  5,000 Units Subcutaneous Q8H  . levothyroxine  25 mcg Oral QAC breakfast  . midodrine  10 mg Oral 2 times per day on Tue Thu Sat  . sertraline  100 mg Oral Daily  . vitamin B-12  1,000 mcg Oral Daily      Otelia Santee,  MD 04/04/2019, 2:16 PM

## 2019-04-04 NOTE — Progress Notes (Signed)
Progress Note    Mary Wilcox  YOV:[id number] DOB: February 22, 1956  DOA: 04/03/2019 PCP: Kristen Loader, FNP    Brief Narrative:     Medical records reviewed and are as summarized below:  Mary Wilcox is an 64 y.o. female with medical history significant for ESRD on hemodialysis Tuesday Thursday Saturday [facility], remote history of cervical cancer who presents with a chief complaint of weakness and inability to stand, found to have severe hyperkalemia.  She reports she normally walks --- missed dialysis on Saturday and today has been weak.  She reports this weakness was associated with a fall yesterday as well, reportedly mechanical involving the back of her head after she felt a little dizzy and lost balance.  No syncope, loss of consciousness or residual pain.  Assessment/Plan:   Active Problems:   Hyperkalemia   Pressure injury of skin   ESRD with Severe hyperkalemia due to missed HD Uremia -Presents after missing HD x1 day and recent permacath malfunction at normal hemodialysis sessions, found to have weakness with associated hyperkalemia greater than 7.5, with EKG changes of peaked T waves, as well as BUN significantly above her baseline and an associated anion gap metabolic acidosis. -EKG changes noted on arrival with potassium greater than 7.5 status post temporization in the ED and subsequently taken to dialysis -Nephrology consulted  Weakness -CT scan unrevealing -PT eval -add voltaren gel for low back pain  Anemia of chronic disease -Hemoglobin 13.4, currently at baseline -Continue home Auryxia  Pressure Injury 04/04/19 Buttocks Bilateral;Mid Stage 1 - Intact skin - POA  obesity Body mass index is 38.84 kg/m.   Family Communication/Anticipated D/C date and plan/Code Status   DVT prophylaxis: heparin Code Status: Full Code.  Family Communication:  Disposition Plan: PTeval   Medical Consultants:    renal  Subjective:   C/o low back  pain  Objective:    Vitals:   04/04/19 0039 04/04/19 0406 04/04/19 0500 04/04/19 0818  BP:  116/63  (!) 105/51  Pulse: 64 68  66  Resp: 20 18  20   Temp:  98.7 F (37.1 C)  98.9 F (37.2 C)  TempSrc: Oral Oral  Oral  SpO2:    100%  Weight:   90.2 kg   Height:        Intake/Output Summary (Last 24 hours) at 04/04/2019 1033 Last data filed at 04/04/2019 0700 Gross per 24 hour  Intake 240 ml  Output 1942 ml  Net -1702 ml   Filed Weights   04/03/19 1556 04/04/19 0500  Weight: 85 kg 90.2 kg    Exam: In bed, NAD rrr No increased work of breathing Obese Moves all 4 ext  Data Reviewed:   I have personally reviewed following labs and imaging studies:  Labs: Labs show the following:   Basic Metabolic Panel: Recent Labs  Lab 04/03/19 1715 04/03/19 2312 04/04/19 0817  NA 133*  --   --   K >7.5* 4.2  --   CL 90*  --   --   CO2 17*  --   --   GLUCOSE 75  --   --   BUN 133*  --   --   CREATININE 16.69*  --   --   CALCIUM 8.1*  --   --   MG 2.9*  --  2.0  PHOS  --   --  7.7*   GFR Estimated Creatinine Clearance: 3.4 mL/min (A) (by C-G formula based on SCr of 16.69  mg/dL (H)). Liver Function Tests: Recent Labs  Lab 04/03/19 1715  AST 13*  ALT 15  ALKPHOS 154*  BILITOT 0.7  PROT 7.4  ALBUMIN 4.1   No results for input(s): LIPASE, AMYLASE in the last 168 hours. No results for input(s): AMMONIA in the last 168 hours. Coagulation profile No results for input(s): INR, PROTIME in the last 168 hours.  CBC: Recent Labs  Lab 04/03/19 1715 04/04/19 0817  WBC 13.3* 8.5  8.5  NEUTROABS 11.2*  --   HGB 13.4 11.2*  11.3*  HCT 41.1 34.5*  34.6*  MCV 103.8* 102.7*  103.0*  PLT 286 189  181   Cardiac Enzymes: No results for input(s): CKTOTAL, CKMB, CKMBINDEX, TROPONINI in the last 168 hours. BNP (last 3 results) No results for input(s): PROBNP in the last 8760 hours. CBG: No results for input(s): GLUCAP in the last 168 hours. D-Dimer: No results for  input(s): DDIMER in the last 72 hours. Hgb A1c: No results for input(s): HGBA1C in the last 72 hours. Lipid Profile: No results for input(s): CHOL, HDL, LDLCALC, TRIG, CHOLHDL, LDLDIRECT in the last 72 hours. Thyroid function studies: No results for input(s): TSH, T4TOTAL, T3FREE, THYROIDAB in the last 72 hours.  Invalid input(s): FREET3 Anemia work up: No results for input(s): VITAMINB12, FOLATE, FERRITIN, TIBC, IRON, RETICCTPCT in the last 72 hours. Sepsis Labs: Recent Labs  Lab 04/03/19 1715 04/04/19 0817  WBC 13.3* 8.5  8.5    Microbiology Recent Results (from the past 240 hour(s))  Respiratory Panel by RT PCR (Flu A&B, Covid) - Nasopharyngeal Swab     Status: None   Collection Time: 04/03/19  4:34 PM   Specimen: Nasopharyngeal Swab  Result Value Ref Range Status   SARS Coronavirus 2 by RT PCR NEGATIVE NEGATIVE Final    Comment: (NOTE) SARS-CoV-2 target nucleic acids are NOT DETECTED. The SARS-CoV-2 RNA is generally detectable in upper respiratoy specimens during the acute phase of infection. The lowest concentration of SARS-CoV-2 viral copies this assay can detect is 131 copies/mL. A negative result does not preclude SARS-Cov-2 infection and should not be used as the sole basis for treatment or other patient management decisions. A negative result may occur with  improper specimen collection/handling, submission of specimen other than nasopharyngeal swab, presence of viral mutation(s) within the areas targeted by this assay, and inadequate number of viral copies (<131 copies/mL). A negative result must be combined with clinical observations, patient history, and epidemiological information. The expected result is Negative. Fact Sheet for Patients:  PinkCheek.be Fact Sheet for Healthcare Providers:  GravelBags.it This test is not yet ap proved or cleared by the [place] FDA and  has been authorized for  detection and/or diagnosis of SARS-CoV-2 by FDA under an Emergency Use Authorization (EUA). This EUA will remain  in effect (meaning this test can be used) for the duration of the COVID-19 declaration under Section 564(b)(1) of the Act, 21 U.S.C. section 360bbb-3(b)(1), unless the authorization is terminated or revoked sooner.    Influenza A by PCR NEGATIVE NEGATIVE Final   Influenza B by PCR NEGATIVE NEGATIVE Final    Comment: (NOTE) The Xpert Xpress SARS-CoV-2/FLU/RSV assay is intended as an aid in  the diagnosis of influenza from Nasopharyngeal swab specimens and  should not be used as a sole basis for treatment. Nasal washings and  aspirates are unacceptable for Xpert Xpress SARS-CoV-2/FLU/RSV  testing. Fact Sheet for Patients: PinkCheek.be Fact Sheet for Healthcare Providers: GravelBags.it This test is not yet approved or  cleared by the [place] and  has been authorized for detection and/or diagnosis of SARS-CoV-2 by  FDA under an Emergency Use Authorization (EUA). This EUA will remain  in effect (meaning this test can be used) for the duration of the  Covid-19 declaration under Section 564(b)(1) of the Act, 21  U.S.C. section 360bbb-3(b)(1), unless the authorization is  terminated or revoked. Performed at [facility], [place] [address], [place], [place] [address]     Procedures and diagnostic studies:  CT Head Wo Contrast  Result Date: 04/03/2019 CLINICAL DATA:  Status post trauma. EXAM: CT HEAD WITHOUT CONTRAST TECHNIQUE: Contiguous axial images were obtained from the base of the skull through the vertex without intravenous contrast. COMPARISON:  None. FINDINGS: Brain: There is mild cerebral atrophy with widening of the extra-axial spaces and ventricular dilatation. There are areas of decreased attenuation within the white matter tracts of the supratentorial brain, consistent with microvascular disease  changes. Vascular: No hyperdense vessel or unexpected calcification. Skull: Normal. Negative for fracture or focal lesion. Sinuses/Orbits: No acute finding. Other: None. IMPRESSION: No acute intracranial pathology. Electronically Signed   By: Virgina Norfolk M.D.   On: 04/03/2019 19:19   CT Cervical Spine Wo Contrast  Result Date: 04/03/2019 CLINICAL DATA:  Status post trauma. EXAM: CT CERVICAL SPINE WITHOUT CONTRAST TECHNIQUE: Multidetector CT imaging of the cervical spine was performed without intravenous contrast. Multiplanar CT image reconstructions were also generated. COMPARISON:  None. FINDINGS: Alignment: Normal. Skull base and vertebrae: No acute fracture. No primary bone lesion or focal pathologic process. Soft tissues and spinal canal: No prevertebral fluid or swelling. No visible canal hematoma. Disc levels: C2-3: Normal endplates. Very mild disc space narrowing is seen. Bilateral facet hypertrophy is noted. Normal central canal and intervertebral neuroforamina. C3-4: Normal endplates. Very mild disc space narrowing is seen. Bilateral facet hypertrophy is noted. Normal central canal and intervertebral neuroforamina. C4-5: Normal endplates. Mild disc space narrowing is seen. Bilateral facet hypertrophy is noted. Normal central canal and intervertebral neuroforamina. C5-6: Normal endplates. Mild disc space narrowing is seen. Bilateral facet hypertrophy is noted. Normal central canal and intervertebral neuroforamina. C6-7: Very mild end plate spondylosis. Mild disc space narrowing is seen. Bilateral facet hypertrophy is noted. Normal central canal and intervertebral neuroforamina. C7-T1: Normal endplates. Very mild disc space narrowing is seen. Bilateral facet hypertrophy is noted. Normal central canal and intervertebral neuroforamina. Upper chest: Negative. Other: None. IMPRESSION: 1. Mild degenerative changes without evidence of acute osseous abnormality within the cervical spine. Electronically  Signed   By: Virgina Norfolk M.D.   On: 04/03/2019 19:27   DG Chest Port 1 View  Result Date: 04/03/2019 CLINICAL DATA:  Weakness.  Missed dialysis. EXAM: PORTABLE CHEST 1 VIEW COMPARISON:  April 11, 2011 FINDINGS: There is a right internal jugular venous catheter with its distal tip noted just beyond the junction of the superior vena cava and right atrium. Very mild linear scarring and/or atelectasis is seen within the lateral aspect of the right lung base. There is no evidence of a pleural effusion or pneumothorax. The heart size and mediastinal contours are within normal limits. A radiopaque vascular stent is seen overlying the left apex and left axilla. This represents a new finding when compared to the prior exam. The visualized skeletal structures are unremarkable. IMPRESSION: 1. Mild right basilar linear scarring and/or atelectasis, without acute or active cardiopulmonary disease. Electronically Signed   By: Virgina Norfolk M.D.   On: 04/03/2019 16:28    Medications:   .  calcium acetate  667 mg Oral TID WC  . Chlorhexidine Gluconate Cloth  6 each Topical Q0600  . cinacalcet  90 mg Oral QHS  . diclofenac Sodium  2 g Topical QID  . ferric citrate  420 mg Oral TID WC  . ferric citrate  420 mg Oral TID WC  . gabapentin  100 mg Oral QHS  . heparin  5,000 Units Subcutaneous Q8H  . levothyroxine  25 mcg Oral QAC breakfast  . midodrine  10 mg Oral 2 times per day on Tue Thu Sat  . sertraline  100 mg Oral Daily  . vitamin B-12  1,000 mcg Oral Daily   Continuous Infusions:   LOS: 0 days   Geradine Girt  [facility]   How to contact the [facility] Attending or Consulting provider [place] or covering provider during after hours [place], for this patient?  1. Check the care team in [facility] and look for a) attending/consulting [facility] provider listed and b) the [facility] team listed 2. Log into [email or web address] and use 's universal password to access. If you do not have the password, please  contact the hospital operator. 3. Locate the [facility] provider you are looking for under [facility] and page to a number that you can be directly reached. 4. If you still have difficulty reaching the provider, please page the [facility] (Director on Call) for the Hospitalists listed on amion for assistance.  04/04/2019, 10:33 AM

## 2019-04-05 ENCOUNTER — Inpatient Hospital Stay (HOSPITAL_COMMUNITY): Payer: Medicare Other

## 2019-04-05 DIAGNOSIS — Z9115 Patient's noncompliance with renal dialysis: Secondary | ICD-10-CM

## 2019-04-05 LAB — CBC
HCT: 37.8 % (ref 36.0–46.0)
Hemoglobin: 12.1 g/dL (ref 12.0–15.0)
MCH: 33.5 pg (ref 26.0–34.0)
MCHC: 32 g/dL (ref 30.0–36.0)
MCV: 104.7 fL — ABNORMAL HIGH (ref 80.0–100.0)
Platelets: 176 10*3/uL (ref 150–400)
RBC: 3.61 MIL/uL — ABNORMAL LOW (ref 3.87–5.11)
RDW: 15.6 % — ABNORMAL HIGH (ref 11.5–15.5)
WBC: 7.4 10*3/uL (ref 4.0–10.5)
nRBC: 0 % (ref 0.0–0.2)

## 2019-04-05 LAB — BASIC METABOLIC PANEL
Anion gap: 15 (ref 5–15)
BUN: 35 mg/dL — ABNORMAL HIGH (ref 8–23)
CO2: 23 mmol/L (ref 22–32)
Calcium: 8 mg/dL — ABNORMAL LOW (ref 8.9–10.3)
Chloride: 99 mmol/L (ref 98–111)
Creatinine, Ser: 7.47 mg/dL — ABNORMAL HIGH (ref 0.44–1.00)
GFR calc Af Amer: 6 mL/min — ABNORMAL LOW (ref >60)
GFR calc non Af Amer: 5 mL/min — ABNORMAL LOW (ref >60)
Glucose, Bld: 86 mg/dL (ref 70–99)
Potassium: 4.9 mmol/L (ref 3.5–5.1)
Sodium: 137 mmol/L (ref 135–145)

## 2019-04-05 LAB — MAGNESIUM: Magnesium: 1.9 mg/dL (ref 1.7–2.4)

## 2019-04-05 LAB — PHOSPHORUS: Phosphorus: 5 mg/dL — ABNORMAL HIGH (ref 2.5–4.6)

## 2019-04-05 IMAGING — DX DG LUMBAR SPINE 2-3V
3 series · 3 of 3 positions shown · non-contrast
Comparison: None.

CLINICAL DATA: Back pain status post fall.

EXAM:
LUMBAR SPINE - 2-3 VIEW

[l-spine ap]
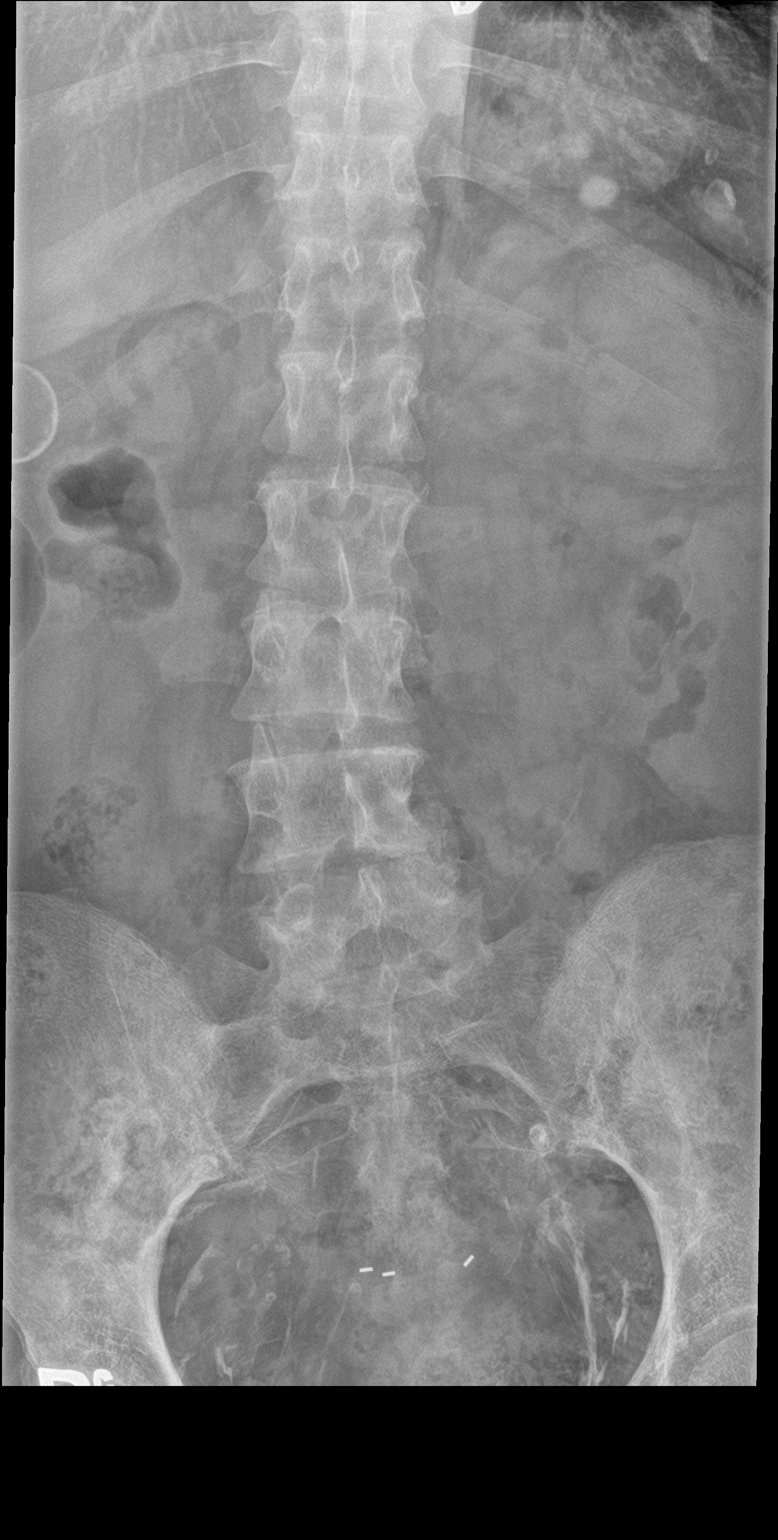

[l-spine lat]
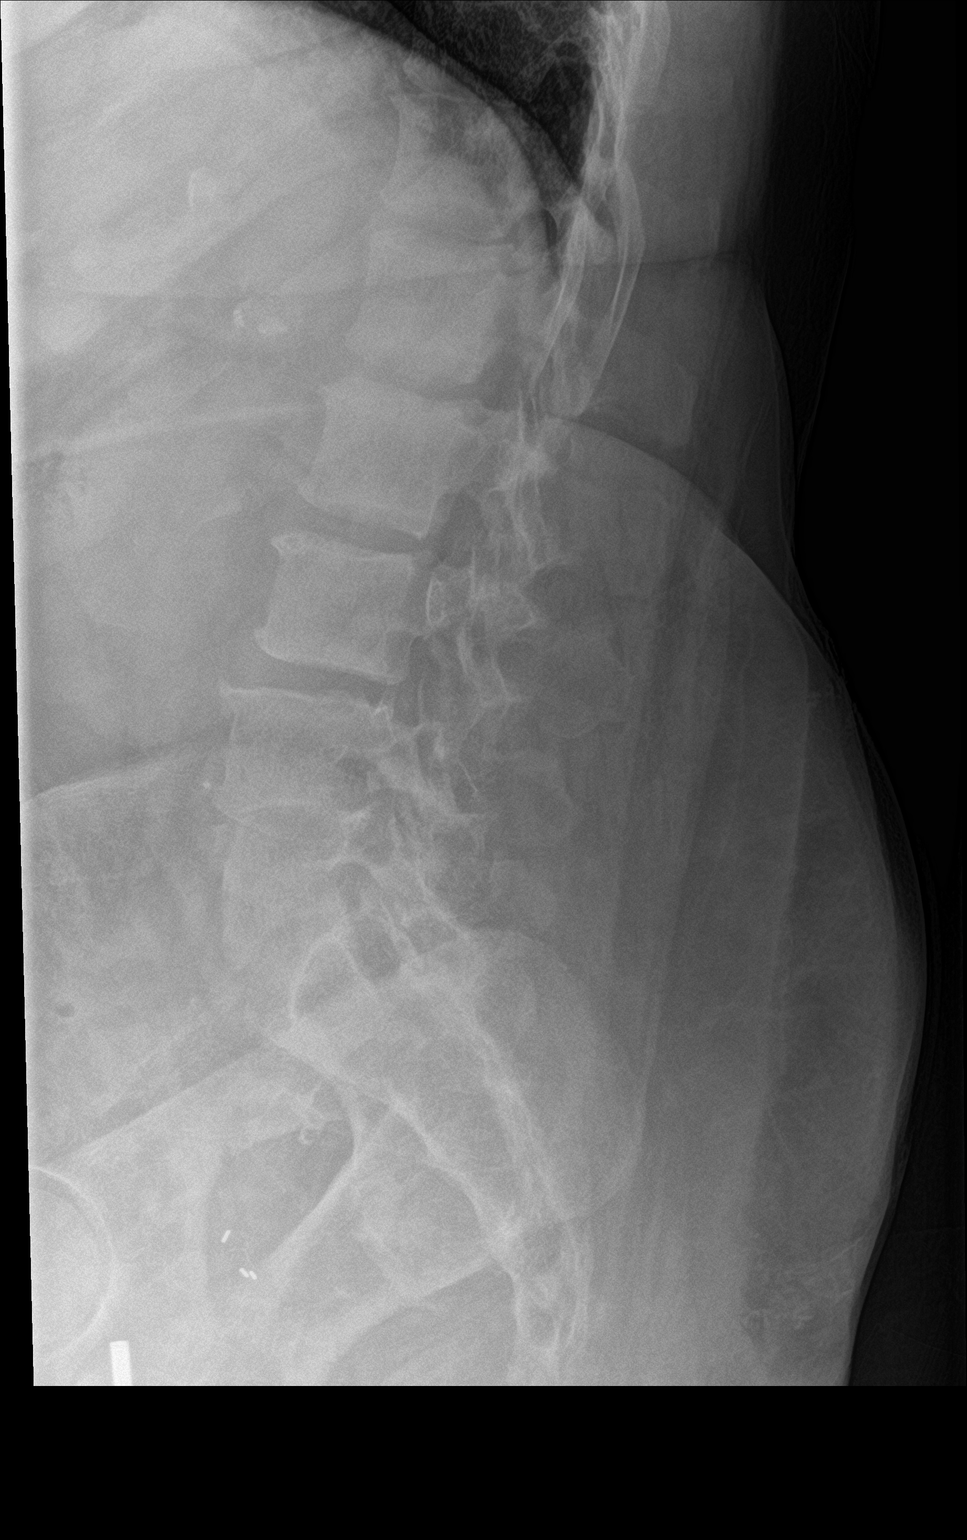

[l-spine spot]
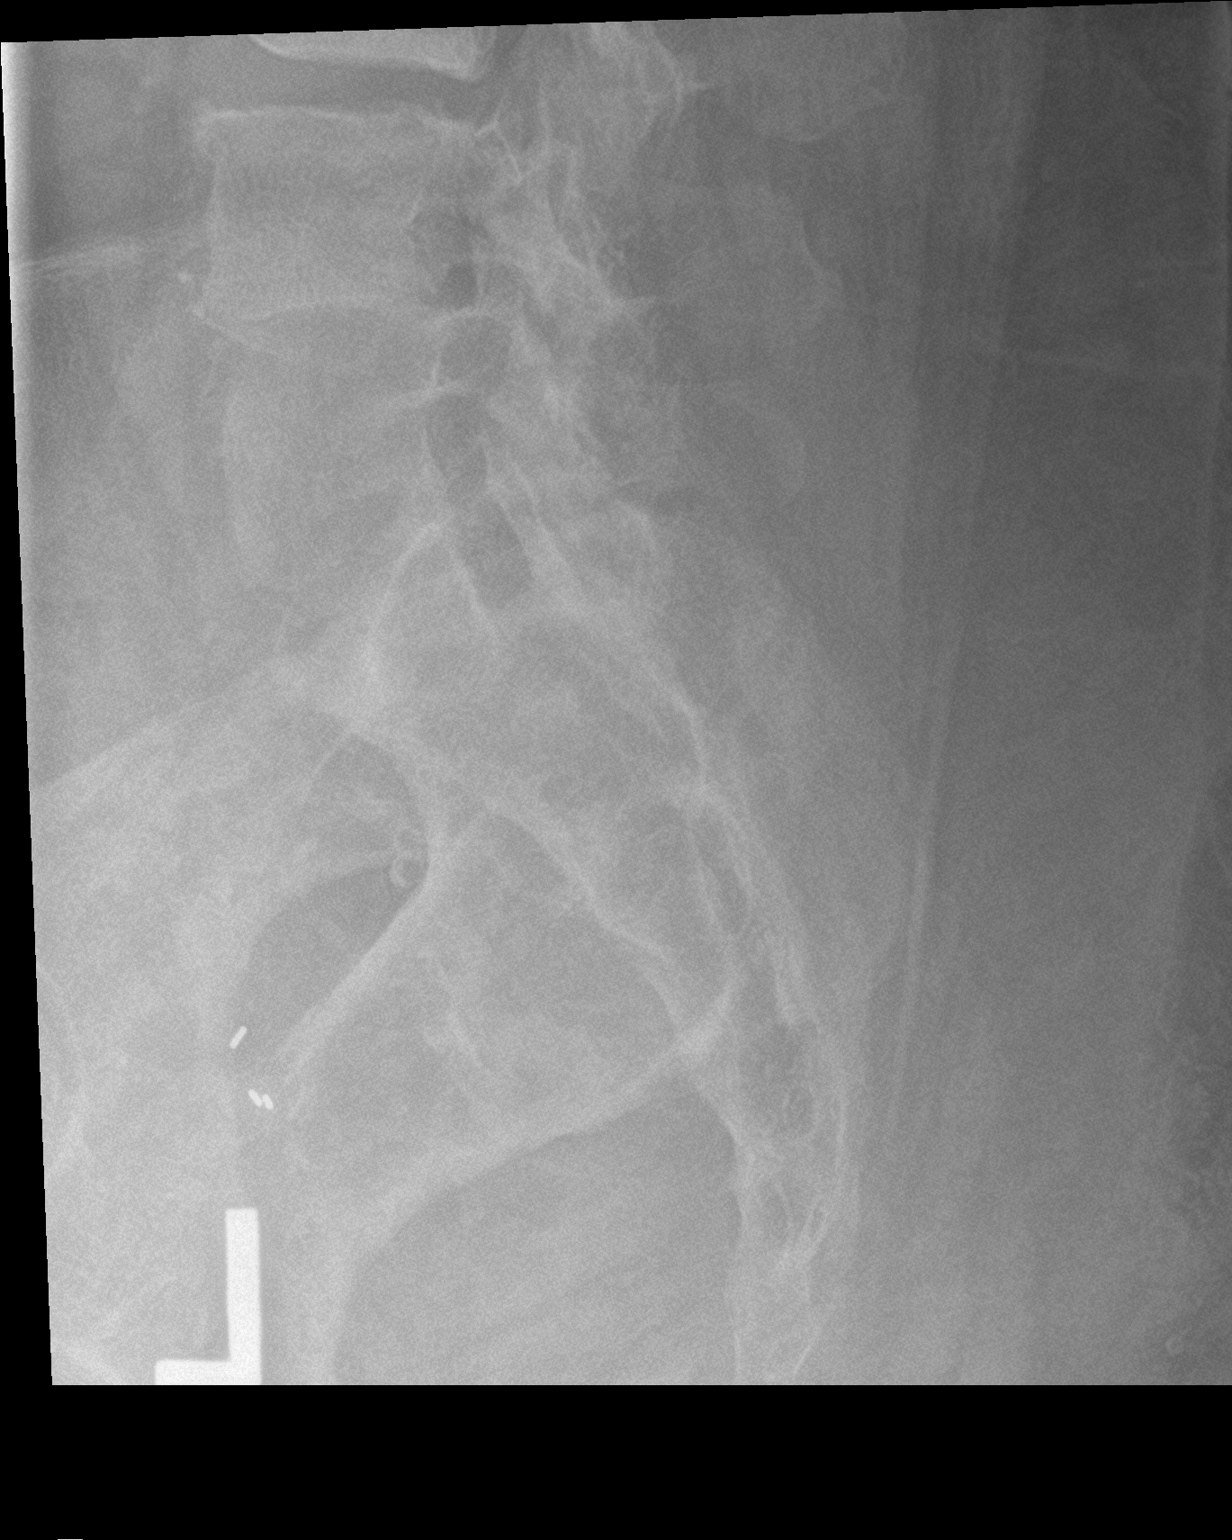

[3 of 3 positions shown; findings below may reference images not displayed]

FINDINGS: There is no evidence of an acute lumbar spine fracture.

Mild dextroscoliosis of the lower lumbar spine is noted.

Mild multilevel endplate sclerosis is seen. This is slightly more
prominent at the level of L3-L4.

Marked severity intervertebral disc space narrowing is seen at the
levels of L4-L5 and L5-S1.
IMPRESSION: Multilevel degenerative changes, most prominent at the levels of
L4-L5 and L5-S1.

## 2019-04-05 NOTE — Progress Notes (Signed)
[facility] Progress Note   64 y.o.femalewith ESRD on HD TTS [facility], remote h/o cervical ca who is seen for evaluation and management of severe hyperkalemia w/ peaked T waves.  Receiving IV ca, IV insulin, dextrose, bicarb and lokelma in the ED. Rapid COVID and flu neg in ED.   Outpt HD: [facility] TTS 160 BFR 400 DRF 900 EDW 83.5 3hr 45 min 2K, 2.25 Ca Heparin 4000u IV qtx  Assessment/ Plan:   1) Hyperkalemia:  rx acute HD last night.  2) ESRD on HD:   HD 1/19 net 2.1L   Next  HD tomorrow on TTS schedule if she's still here  3) Anemia: Hb 13.4. not on ESA  4) BMM: CorrCa ok. Last outptphos 8.2. Last [id number]. Renal diet and auryxia.    Subjective:   Feels a little better today. No issues with HD yest.   Objective:   BP (!) 114/56 (BP Location: Right Arm)   Pulse 77   Temp 98.1 F (36.7 C) (Oral)   Resp 20   Ht 5' (1.524 m)   Wt 89.1 kg   SpO2 100%   BMI 38.36 kg/m   Intake/Output Summary (Last 24 hours) at 04/05/2019 1005 Last data filed at 04/05/2019 0800 Gross per 24 hour  Intake 480 ml  Output 2100 ml  Net -1620 ml   Weight change: 4.2 kg  Physical Exam: JAS:[id number] ill appearing woman Neck:thick CV:RRR, no rub PFX:TKWI, obese Lungs: clear Extr: 1+ tibial edema Access: RIJ TC  c/d/i   Imaging: CT Head Wo Contrast  Result Date: 04/03/2019 CLINICAL DATA:  Status post trauma. EXAM: CT HEAD WITHOUT CONTRAST TECHNIQUE: Contiguous axial images were obtained from the base of the skull through the vertex without intravenous contrast. COMPARISON:  None. FINDINGS: Brain: There is mild cerebral atrophy with widening of the extra-axial spaces and ventricular dilatation. There are areas of decreased attenuation within the white matter tracts of the supratentorial brain, consistent with microvascular disease changes. Vascular: No hyperdense vessel or unexpected calcification. Skull: Normal. Negative for fracture or  focal lesion. Sinuses/Orbits: No acute finding. Other: None. IMPRESSION: No acute intracranial pathology. Electronically Signed   By: Virgina Norfolk M.D.   On: 04/03/2019 19:19   CT Cervical Spine Wo Contrast  Result Date: 04/03/2019 CLINICAL DATA:  Status post trauma. EXAM: CT CERVICAL SPINE WITHOUT CONTRAST TECHNIQUE: Multidetector CT imaging of the cervical spine was performed without intravenous contrast. Multiplanar CT image reconstructions were also generated. COMPARISON:  None. FINDINGS: Alignment: Normal. Skull base and vertebrae: No acute fracture. No primary bone lesion or focal pathologic process. Soft tissues and spinal canal: No prevertebral fluid or swelling. No visible canal hematoma. Disc levels: C2-3: Normal endplates. Very mild disc space narrowing is seen. Bilateral facet hypertrophy is noted. Normal central canal and intervertebral neuroforamina. C3-4: Normal endplates. Very mild disc space narrowing is seen. Bilateral facet hypertrophy is noted. Normal central canal and intervertebral neuroforamina. C4-5: Normal endplates. Mild disc space narrowing is seen. Bilateral facet hypertrophy is noted. Normal central canal and intervertebral neuroforamina. C5-6: Normal endplates. Mild disc space narrowing is seen. Bilateral facet hypertrophy is noted. Normal central canal and intervertebral neuroforamina. C6-7: Very mild end plate spondylosis. Mild disc space narrowing is seen. Bilateral facet hypertrophy is noted. Normal central canal and intervertebral neuroforamina. C7-T1: Normal endplates. Very mild disc space narrowing is seen. Bilateral facet hypertrophy is noted. Normal central canal and intervertebral neuroforamina. Upper chest: Negative. Other: None. IMPRESSION: 1. Mild degenerative changes without evidence  of acute osseous abnormality within the cervical spine. Electronically Signed   By: Virgina Norfolk M.D.   On: 04/03/2019 19:27   DG Chest Port 1 View  Result Date:  04/03/2019 CLINICAL DATA:  Weakness.  Missed dialysis. EXAM: PORTABLE CHEST 1 VIEW COMPARISON:  April 11, 2011 FINDINGS: There is a right internal jugular venous catheter with its distal tip noted just beyond the junction of the superior vena cava and right atrium. Very mild linear scarring and/or atelectasis is seen within the lateral aspect of the right lung base. There is no evidence of a pleural effusion or pneumothorax. The heart size and mediastinal contours are within normal limits. A radiopaque vascular stent is seen overlying the left apex and left axilla. This represents a new finding when compared to the prior exam. The visualized skeletal structures are unremarkable. IMPRESSION: 1. Mild right basilar linear scarring and/or atelectasis, without acute or active cardiopulmonary disease. Electronically Signed   By: Virgina Norfolk M.D.   On: 04/03/2019 16:28    Labs: BMET Recent Labs  Lab 04/03/19 1715 04/03/19 2312 04/04/19 0817 04/04/19 1353 04/05/19 0301  NA 133*  --   --  135  --   K >7.5* 4.2  --  6.0*  --   CL 90*  --   --  95*  --   CO2 17*  --   --  25  --   GLUCOSE 75  --   --  99  --   BUN 133*  --   --  57*  --   CREATININE 16.69*  --   --  10.38*  --   CALCIUM 8.1*  --   --  8.0*  --   PHOS  --   --  7.7* 7.9* 5.0*   CBC Recent Labs  Lab 04/03/19 1715 04/04/19 0817 04/05/19 0301  WBC 13.3* 8.5  8.5 7.4  NEUTROABS 11.2*  --   --   HGB 13.4 11.2*  11.3* 12.1  HCT 41.1 34.5*  34.6* 37.8  MCV 103.8* 102.7*  103.0* 104.7*  PLT 286 189  181 176    Medications:    . calcium acetate  667 mg Oral TID WC  . Chlorhexidine Gluconate Cloth  6 each Topical Q0600  . cinacalcet  90 mg Oral QHS  . diclofenac Sodium  2 g Topical QID  . ferric citrate  420 mg Oral TID WC  . ferric citrate  420 mg Oral TID WC  . gabapentin  100 mg Oral QHS  . heparin  5,000 Units Subcutaneous Q8H  . levothyroxine  25 mcg Oral QAC breakfast  . midodrine  10 mg Oral 2 times per  day on Tue Thu Sat  . sertraline  100 mg Oral Daily  . vitamin B-12  1,000 mcg Oral Daily      Otelia Santee, MD 04/05/2019, 10:05 AM

## 2019-04-05 NOTE — Evaluation (Signed)
Occupational Therapy Evaluation Patient Details Name: Mary Wilcox MRN: [id number] DOB: [birth year] Today's Date: 04/05/2019    History of Present Illness Pt is a 64 y/o female admitted secondary to weakness and fall. Found to be hyperkalemic. CT of head and Cspine negative for acute abnormality. PMH includes ESRD on HD TTS, cervical cancer, PVD, and s/p L AV fistula placement.    Clinical Impression   Pt PTA: Pt living alone with grocery delivery to door and [employer] for transport. Pt has neighbor who can assist. Pt currently performing ADL and mobility with supervisionA to modified independence level with RW for support. Pt at baseline for ADL as pt pan bathes at PLOF to conserve energy. Pt aware of energy conservation techniques. Pt's biggest concern is RLE pain. PT going to address.  Pt does not require continued OT skilled services. OT signing off.    Follow Up Recommendations  No OT follow up    Equipment Recommendations  None recommended by OT    Recommendations for Other Services       Precautions / Restrictions Precautions Precautions: Fall Restrictions Weight Bearing Restrictions: No      Mobility Bed Mobility Overal bed mobility: Needs Assistance Bed Mobility: Supine to Sit;Sit to Supine     Supine to sit: Modified independent (Device/Increase time)     General bed mobility comments: no physical assist  Transfers Overall transfer level: Needs assistance Equipment used: Rolling walker (2 wheeled) Transfers: Sit to/from Stand Sit to Stand: Supervision         General transfer comment: SupervisionA for safety    Balance Overall balance assessment: Needs assistance Sitting-balance support: No upper extremity supported;Feet supported Sitting balance-Leahy Scale: Good     Standing balance support: Bilateral upper extremity supported;No upper extremity supported;During functional activity Standing balance-Leahy Scale: Fair Standing balance comment: Reliant  on BUE support; no UE supported with tasks at sink                           ADL either performed or assessed with clinical judgement   ADL Overall ADL's : At baseline                                       General ADL Comments: Pt standing at sink for ADL and figure four or bending over to fix socks.      Vision Baseline Vision/History: Wears glasses Wears Glasses: At all times Patient Visual Report: No change from baseline Vision Assessment?: No apparent visual deficits     Perception     Praxis      Pertinent Vitals/Pain       Hand Dominance Right   Extremity/Trunk Assessment Upper Extremity Assessment Upper Extremity Assessment: Overall WFL for tasks assessed   Lower Extremity Assessment Lower Extremity Assessment: Defer to PT evaluation;RLE deficits/detail RLE Deficits / Details: weakness and R lower ankle/foot pain with wt bear   Cervical / Trunk Assessment Cervical / Trunk Assessment: Normal   Communication Communication Communication: No difficulties   Cognition Arousal/Alertness: Awake/alert Behavior During Therapy: WFL for tasks assessed/performed Overall Cognitive Status: Within Functional Limits for tasks assessed                                     General Comments  Pt unable to recall  recommendations of SNF and pt refusing at this time. Pt appears to have increased in function not requiring SNF.    Exercises     Shoulder Instructions      Home Living Family/patient expects to be discharged to:: Private residence Living Arrangements: Alone Available Help at Discharge: Family;Available PRN/intermittently Type of Home: Apartment Home Access: Stairs to enter Entrance Stairs-Number of Steps: flight Entrance Stairs-Rails: Right;Left;Can reach both Home Layout: One level     Bathroom Shower/Tub: Teacher, early years/pre: Standard     Home Equipment: Cane - single point   Additional  Comments: Neighbor checks on her      Prior Functioning/Environment Level of Independence: Independent        Comments: Uses [employer] for transportation. Does her own cooking, cleaning, and has been using online grocery delivery        OT Problem List: Decreased activity tolerance;Impaired balance (sitting and/or standing)      OT Treatment/Interventions:      OT Goals(Current goals can be found in the care plan section) Acute Rehab OT Goals Patient Stated Goal: to get stronger and go home  OT Frequency:     Barriers to D/C:            Co-evaluation              AM-PAC OT "6 Clicks" Daily Activity     Outcome Measure Help from another person eating meals?: None Help from another person taking care of personal grooming?: None Help from another person toileting, which includes using toliet, bedpan, or urinal?: None Help from another person bathing (including washing, rinsing, drying)?: None Help from another person to put on and taking off regular upper body clothing?: None Help from another person to put on and taking off regular lower body clothing?: None 6 Click Score: 24   End of Session Equipment Utilized During Treatment: Rolling walker Nurse Communication: Mobility status  Activity Tolerance: Patient tolerated treatment well Patient left: in chair;with call bell/phone within reach  OT Visit Diagnosis: Muscle weakness (generalized) (M62.81);Pain Pain - Right/Left: Right Pain - part of body: Ankle and joints of foot                Time: 1657-9038 OT Time Calculation (min): 17 min Charges:  OT General Charges $OT Visit: 1 Visit OT Evaluation $OT Eval Moderate Complexity: [place] OTR/L Acute Rehabilitation Services Pager: [phone] Office: [phone]   HFSFSEL C 04/05/2019, 2:41 PM

## 2019-04-05 NOTE — Progress Notes (Signed)
Physical Therapy Treatment Patient Details Name: Mary Wilcox MRN: [id number] DOB: [birth year] Today's Date: 04/05/2019    History of Present Illness Pt is a 64 y/o female admitted secondary to weakness and fall. Found to be hyperkalemic. CT of head and Cspine negative for acute abnormality. PMH includes ESRD on HD TTS, cervical cancer, PVD, and s/p L AV fistula placement.     PT Comments    Pt showing improvements over initial evaluation.  Hopefully will progress to cane and manage steps next treatment session.   Follow Up Recommendations  Other (comment);Home health PT(working toward and likely will be safe to return home )     Equipment Recommendations  Other (comment)(TBA)    Recommendations for Other Services       Precautions / Restrictions Precautions Precautions: Fall Restrictions Weight Bearing Restrictions: No    Mobility  Bed Mobility Overal bed mobility: Needs Assistance Bed Mobility: Supine to Sit;Sit to Supine     Supine to sit: Modified independent (Device/Increase time)     General bed mobility comments: no physical assist  Transfers Overall transfer level: Needs assistance Equipment used: Rolling walker (2 wheeled) Transfers: Sit to/from Stand Sit to Stand: Supervision         General transfer comment: SupervisionA for safety  Ambulation/Gait Ambulation/Gait assistance: Min assist;Min guard Gait Distance (Feet): 75 Feet Assistive device: Rolling walker (2 wheeled) Gait Pattern/deviations: Step-through pattern Gait velocity: Decreased Gait velocity interpretation: 1.31 - 2.62 ft/sec, indicative of limited community ambulator General Gait Details: pt generally steady today with minimal signs of R LE instability.  A little foot slap, but otherwise good pattern.   Stairs             Wheelchair Mobility    Modified Rankin (Stroke Patients Only)       Balance Overall balance assessment: Needs assistance Sitting-balance  support: No upper extremity supported;Feet supported Sitting balance-Leahy Scale: Good     Standing balance support: Bilateral upper extremity supported;No upper extremity supported;During functional activity Standing balance-Leahy Scale: Fair Standing balance comment: Reliant on BUE support; no UE supported with tasks at sink                            Cognition Arousal/Alertness: Awake/alert Behavior During Therapy: WFL for tasks assessed/performed Overall Cognitive Status: Within Functional Limits for tasks assessed                                        Exercises      General Comments General comments (skin integrity, edema, etc.): pt's function has improved to the point where she will likely be able to go home without overt safety issues.      Pertinent Vitals/Pain      Home Living Family/patient expects to be discharged to:: Private residence Living Arrangements: Alone Available Help at Discharge: Family;Available PRN/intermittently Type of Home: Apartment Home Access: Stairs to enter Entrance Stairs-Rails: Right;Left;Can reach both Home Layout: One level Home Equipment: Cane - single point Additional Comments: Neighbor checks on her    Prior Function Level of Independence: Independent      Comments: Uses [employer] for transportation. Does her own cooking, cleaning, and has been using online grocery delivery   PT Goals (current goals can now be found in the care plan section) Acute Rehab PT Goals Patient Stated Goal: to get stronger and go  home PT Goal Formulation: With patient Time For Goal Achievement: 04/18/19 Potential to Achieve Goals: Good Progress towards PT goals: Progressing toward goals    Frequency    Min 3X/week      PT Plan Current plan remains appropriate    Co-evaluation              AM-PAC PT "6 Clicks" Mobility   Outcome Measure  Help needed turning from your back to your side while in a flat bed  without using bedrails?: None Help needed moving from lying on your back to sitting on the side of a flat bed without using bedrails?: None Help needed moving to and from a bed to a chair (including a wheelchair)?: None Help needed standing up from a chair using your arms (e.g., wheelchair or bedside chair)?: None Help needed to walk in hospital room?: A Little Help needed climbing 3-5 steps with a railing? : A Little 6 Click Score: 22    End of Session   Activity Tolerance: Patient tolerated treatment well Patient left: in chair;with call bell/phone within reach Nurse Communication: Mobility status PT Visit Diagnosis: Unsteadiness on feet (R26.81);Muscle weakness (generalized) (M62.81)     Time: [id number] PT Time Calculation (min) (ACUTE ONLY): 20 min  Charges:  $Gait Training: 8-22 mins                     04/05/2019  Ginger Carne., PT Acute Rehabilitation Services [phone]  (pager) [phone]  (office)   Tessie Fass Everly Rubalcava 04/05/2019, 2:52 PM

## 2019-04-05 NOTE — Progress Notes (Signed)
Progress Note    Mary Wilcox  CWC:[id number] DOB: 1955/09/01  DOA: 04/03/2019 PCP: Kristen Loader, FNP    Brief Narrative:     Medical records reviewed and are as summarized below:  Mary Wilcox is an 64 y.o. female with medical history significant for ESRD on hemodialysis Tuesday Thursday Saturday [facility], remote history of cervical cancer who presents with a chief complaint of weakness and inability to stand, found to have severe hyperkalemia.  She reports she normally walks --- missed dialysis on Saturday and today has been weak.  She reports this weakness was associated with a fall yesterday as well, reportedly mechanical involving the back of her head after she felt a little dizzy and lost balance.  No syncope, loss of consciousness or residual pain.  Assessment/Plan:   Active Problems:   Hyperkalemia   Pressure injury of skin   ESRD with Severe hyperkalemia due to missed HD Uremia -Presents after missing HD x1 day and recent permacath malfunction at normal hemodialysis sessions, found to have weakness with associated hyperkalemia greater than 7.5, with EKG changes of peaked T waves, as well as BUN significantly above her baseline and an associated anion gap metabolic acidosis. -EKG changes noted on arrival with potassium greater than 7.5 status post temporization in the ED and subsequently taken to dialysis -Nephrology consult appreciated  Weakness -CT scan unrevealing of head/neck -PT eval- SNF but patient refusing -add voltaren gel for low back pain -check x ray of lumbar spine  Anemia of chronic disease -Hemoglobin 13.4, currently at baseline -Continue home Auryxia  Pressure Injury 04/04/19 Buttocks Bilateral;Mid Stage 1 - Intact skin - POA  obesity Body mass index is 38.36 kg/m.   Family Communication/Anticipated D/C date and plan/Code Status   DVT prophylaxis: heparin Code Status: Full Code.  Family Communication:  Disposition Plan: home  once able to safely ambulate (lives alone and has stairs-- thus far declining SNF)   Medical Consultants:    renal  Subjective:   C/o numbness in foot/ankle  Objective:    Vitals:   04/05/19 0009 04/05/19 0417 04/05/19 0739 04/05/19 1125  BP:  130/64 (!) 114/56 120/64  Pulse:  68 77 64  Resp:  13 20 18   Temp: 99.5 F (37.5 C) 98.1 F (36.7 C) 98.1 F (36.7 C) 98.5 F (36.9 C)  TempSrc: Oral Oral Oral Oral  SpO2:  97% 100% 97%  Weight:  89.1 kg    Height:        Intake/Output Summary (Last 24 hours) at 04/05/2019 1445 Last data filed at 04/05/2019 1200 Gross per 24 hour  Intake 600 ml  Output 2100 ml  Net -1500 ml   Filed Weights   04/04/19 1345 04/04/19 1650 04/05/19 0417  Weight: 89.2 kg 86.7 kg 89.1 kg    Exam: In bed, NAD rrr No increased work of breathing No bruising on flank   Data Reviewed:   I have personally reviewed following labs and imaging studies:  Labs: Labs show the following:   Basic Metabolic Panel: Recent Labs  Lab 04/03/19 1715 04/03/19 2312 04/04/19 0817 04/04/19 1353 04/05/19 0301 04/05/19 1144  NA 133*  --   --  135  --  137  K >7.5*   < >  --  6.0*  --  4.9  CL 90*  --   --  95*  --  99  CO2 17*  --   --  25  --  23  GLUCOSE 75  --   --  99  --  86  BUN 133*  --   --  57*  --  35*  CREATININE 16.69*  --   --  10.38*  --  7.47*  CALCIUM 8.1*  --   --  8.0*  --  8.0*  MG 2.9*  --  2.0  --  1.9  --   PHOS  --   --  7.7* 7.9* 5.0*  --    < > = values in this interval not displayed.   GFR Estimated Creatinine Clearance: 7.5 mL/min (A) (by C-G formula based on SCr of 7.47 mg/dL (H)). Liver Function Tests: Recent Labs  Lab 04/03/19 1715 04/04/19 1353  AST 13*  --   ALT 15  --   ALKPHOS 154*  --   BILITOT 0.7  --   PROT 7.4  --   ALBUMIN 4.1 3.0*   No results for input(s): LIPASE, AMYLASE in the last 168 hours. No results for input(s): AMMONIA in the last 168 hours. Coagulation profile No results for input(s):  INR, PROTIME in the last 168 hours.  CBC: Recent Labs  Lab 04/03/19 1715 04/04/19 0817 04/05/19 0301  WBC 13.3* 8.5  8.5 7.4  NEUTROABS 11.2*  --   --   HGB 13.4 11.2*  11.3* 12.1  HCT 41.1 34.5*  34.6* 37.8  MCV 103.8* 102.7*  103.0* 104.7*  PLT 286 189  181 176   Cardiac Enzymes: No results for input(s): CKTOTAL, CKMB, CKMBINDEX, TROPONINI in the last 168 hours. BNP (last 3 results) No results for input(s): PROBNP in the last 8760 hours. CBG: No results for input(s): GLUCAP in the last 168 hours. D-Dimer: No results for input(s): DDIMER in the last 72 hours. Hgb A1c: No results for input(s): HGBA1C in the last 72 hours. Lipid Profile: No results for input(s): CHOL, HDL, LDLCALC, TRIG, CHOLHDL, LDLDIRECT in the last 72 hours. Thyroid function studies: No results for input(s): TSH, T4TOTAL, T3FREE, THYROIDAB in the last 72 hours.  Invalid input(s): FREET3 Anemia work up: No results for input(s): VITAMINB12, FOLATE, FERRITIN, TIBC, IRON, RETICCTPCT in the last 72 hours. Sepsis Labs: Recent Labs  Lab 04/03/19 1715 04/04/19 0817 04/05/19 0301  WBC 13.3* 8.5  8.5 7.4    Microbiology Recent Results (from the past 240 hour(s))  Respiratory Panel by RT PCR (Flu A&B, Covid) - Nasopharyngeal Swab     Status: None   Collection Time: 04/03/19  4:34 PM   Specimen: Nasopharyngeal Swab  Result Value Ref Range Status   SARS Coronavirus 2 by RT PCR NEGATIVE NEGATIVE Final    Comment: (NOTE) SARS-CoV-2 target nucleic acids are NOT DETECTED. The SARS-CoV-2 RNA is generally detectable in upper respiratoy specimens during the acute phase of infection. The lowest concentration of SARS-CoV-2 viral copies this assay can detect is 131 copies/mL. A negative result does not preclude SARS-Cov-2 infection and should not be used as the sole basis for treatment or other patient management decisions. A negative result may occur with  improper specimen collection/handling, submission  of specimen other than nasopharyngeal swab, presence of viral mutation(s) within the areas targeted by this assay, and inadequate number of viral copies (<131 copies/mL). A negative result must be combined with clinical observations, patient history, and epidemiological information. The expected result is Negative. Fact Sheet for Patients:  PinkCheek.be Fact Sheet for Healthcare Providers:  GravelBags.it This test is not yet ap proved or cleared by the [place] FDA and  has been authorized for detection and/or diagnosis of  SARS-CoV-2 by FDA under an Emergency Use Authorization (EUA). This EUA will remain  in effect (meaning this test can be used) for the duration of the COVID-19 declaration under Section 564(b)(1) of the Act, 21 U.S.C. section 360bbb-3(b)(1), unless the authorization is terminated or revoked sooner.    Influenza A by PCR NEGATIVE NEGATIVE Final   Influenza B by PCR NEGATIVE NEGATIVE Final    Comment: (NOTE) The Xpert Xpress SARS-CoV-2/FLU/RSV assay is intended as an aid in  the diagnosis of influenza from Nasopharyngeal swab specimens and  should not be used as a sole basis for treatment. Nasal washings and  aspirates are unacceptable for Xpert Xpress SARS-CoV-2/FLU/RSV  testing. Fact Sheet for Patients: PinkCheek.be Fact Sheet for Healthcare Providers: GravelBags.it This test is not yet approved or cleared by the [place] FDA and  has been authorized for detection and/or diagnosis of SARS-CoV-2 by  FDA under an Emergency Use Authorization (EUA). This EUA will remain  in effect (meaning this test can be used) for the duration of the  Covid-19 declaration under Section 564(b)(1) of the Act, 21  U.S.C. section 360bbb-3(b)(1), unless the authorization is  terminated or revoked. Performed at [facility], [place] [address], [place],  [place] [address]     Procedures and diagnostic studies:  CT Head Wo Contrast  Result Date: 04/03/2019 CLINICAL DATA:  Status post trauma. EXAM: CT HEAD WITHOUT CONTRAST TECHNIQUE: Contiguous axial images were obtained from the base of the skull through the vertex without intravenous contrast. COMPARISON:  None. FINDINGS: Brain: There is mild cerebral atrophy with widening of the extra-axial spaces and ventricular dilatation. There are areas of decreased attenuation within the white matter tracts of the supratentorial brain, consistent with microvascular disease changes. Vascular: No hyperdense vessel or unexpected calcification. Skull: Normal. Negative for fracture or focal lesion. Sinuses/Orbits: No acute finding. Other: None. IMPRESSION: No acute intracranial pathology. Electronically Signed   By: Virgina Norfolk M.D.   On: 04/03/2019 19:19   CT Cervical Spine Wo Contrast  Result Date: 04/03/2019 CLINICAL DATA:  Status post trauma. EXAM: CT CERVICAL SPINE WITHOUT CONTRAST TECHNIQUE: Multidetector CT imaging of the cervical spine was performed without intravenous contrast. Multiplanar CT image reconstructions were also generated. COMPARISON:  None. FINDINGS: Alignment: Normal. Skull base and vertebrae: No acute fracture. No primary bone lesion or focal pathologic process. Soft tissues and spinal canal: No prevertebral fluid or swelling. No visible canal hematoma. Disc levels: C2-3: Normal endplates. Very mild disc space narrowing is seen. Bilateral facet hypertrophy is noted. Normal central canal and intervertebral neuroforamina. C3-4: Normal endplates. Very mild disc space narrowing is seen. Bilateral facet hypertrophy is noted. Normal central canal and intervertebral neuroforamina. C4-5: Normal endplates. Mild disc space narrowing is seen. Bilateral facet hypertrophy is noted. Normal central canal and intervertebral neuroforamina. C5-6: Normal endplates. Mild disc space narrowing is seen. Bilateral facet  hypertrophy is noted. Normal central canal and intervertebral neuroforamina. C6-7: Very mild end plate spondylosis. Mild disc space narrowing is seen. Bilateral facet hypertrophy is noted. Normal central canal and intervertebral neuroforamina. C7-T1: Normal endplates. Very mild disc space narrowing is seen. Bilateral facet hypertrophy is noted. Normal central canal and intervertebral neuroforamina. Upper chest: Negative. Other: None. IMPRESSION: 1. Mild degenerative changes without evidence of acute osseous abnormality within the cervical spine. Electronically Signed   By: Virgina Norfolk M.D.   On: 04/03/2019 19:27   DG Chest Port 1 View  Result Date: 04/03/2019 CLINICAL DATA:  Weakness.  Missed dialysis. EXAM: PORTABLE  CHEST 1 VIEW COMPARISON:  April 11, 2011 FINDINGS: There is a right internal jugular venous catheter with its distal tip noted just beyond the junction of the superior vena cava and right atrium. Very mild linear scarring and/or atelectasis is seen within the lateral aspect of the right lung base. There is no evidence of a pleural effusion or pneumothorax. The heart size and mediastinal contours are within normal limits. A radiopaque vascular stent is seen overlying the left apex and left axilla. This represents a new finding when compared to the prior exam. The visualized skeletal structures are unremarkable. IMPRESSION: 1. Mild right basilar linear scarring and/or atelectasis, without acute or active cardiopulmonary disease. Electronically Signed   By: Virgina Norfolk M.D.   On: 04/03/2019 16:28    Medications:   . calcium acetate  667 mg Oral TID WC  . Chlorhexidine Gluconate Cloth  6 each Topical Q0600  . cinacalcet  90 mg Oral QHS  . diclofenac Sodium  2 g Topical QID  . ferric citrate  420 mg Oral TID WC  . gabapentin  100 mg Oral QHS  . heparin  5,000 Units Subcutaneous Q8H  . levothyroxine  25 mcg Oral QAC breakfast  . midodrine  10 mg Oral 2 times per day on Tue Thu  Sat  . sertraline  100 mg Oral Daily  . vitamin B-12  1,000 mcg Oral Daily   Continuous Infusions:   LOS: 1 day   Geradine Girt  [facility]   How to contact the [facility] Attending or Consulting provider [place] or covering provider during after hours [place], for this patient?  1. Check the care team in [facility] and look for a) attending/consulting [facility] provider listed and b) the [facility] team listed 2. Log into [email or web address] and use [place]'s universal password to access. If you do not have the password, please contact the hospital operator. 3. Locate the [facility] provider you are looking for under [facility] and page to a number that you can be directly reached. 4. If you still have difficulty reaching the provider, please page the [facility] (Director on Call) for the Hospitalists listed on amion for assistance.  04/05/2019, 2:45 PM

## 2019-04-06 DIAGNOSIS — N186 End stage renal disease: Secondary | ICD-10-CM

## 2019-04-06 LAB — RENAL FUNCTION PANEL
Albumin: 3 g/dL — ABNORMAL LOW (ref 3.5–5.0)
Anion gap: 15 (ref 5–15)
BUN: 65 mg/dL — ABNORMAL HIGH (ref 8–23)
CO2: 23 mmol/L (ref 22–32)
Calcium: 7.6 mg/dL — ABNORMAL LOW (ref 8.9–10.3)
Chloride: 97 mmol/L — ABNORMAL LOW (ref 98–111)
Creatinine, Ser: 10.18 mg/dL — ABNORMAL HIGH (ref 0.44–1.00)
GFR calc Af Amer: 4 mL/min — ABNORMAL LOW (ref >60)
GFR calc non Af Amer: 4 mL/min — ABNORMAL LOW (ref >60)
Glucose, Bld: 77 mg/dL (ref 70–99)
Phosphorus: 5.6 mg/dL — ABNORMAL HIGH (ref 2.5–4.6)
Potassium: 5.1 mmol/L (ref 3.5–5.1)
Sodium: 135 mmol/L (ref 135–145)

## 2019-04-06 LAB — CBC
HCT: 35.4 % — ABNORMAL LOW (ref 36.0–46.0)
Hemoglobin: 11.6 g/dL — ABNORMAL LOW (ref 12.0–15.0)
MCH: 33.8 pg (ref 26.0–34.0)
MCHC: 32.8 g/dL (ref 30.0–36.0)
MCV: 103.2 fL — ABNORMAL HIGH (ref 80.0–100.0)
Platelets: 134 10*3/uL — ABNORMAL LOW (ref 150–400)
RBC: 3.43 MIL/uL — ABNORMAL LOW (ref 3.87–5.11)
RDW: 14.9 % (ref 11.5–15.5)
WBC: 6.6 10*3/uL (ref 4.0–10.5)
nRBC: 0 % (ref 0.0–0.2)

## 2019-04-06 LAB — PHOSPHORUS: Phosphorus: 5.3 mg/dL — ABNORMAL HIGH (ref 2.5–4.6)

## 2019-04-06 LAB — MAGNESIUM: Magnesium: 2.1 mg/dL (ref 1.7–2.4)

## 2019-04-06 MED ORDER — HEPARIN SODIUM (PORCINE) 1000 UNIT/ML IJ SOLN
INTRAMUSCULAR | Status: AC
  Administered 2019-04-06: 3200 [IU]
  Filled 2019-04-06: qty 4

## 2019-04-06 NOTE — Progress Notes (Signed)
[facility] Progress Note   63 y.o.femalewith ESRD on HD TTS [facility], remote h/o cervical ca who is seen for evaluation and management of severe hyperkalemiaw/peaked T waves. Receiving IV ca, IV insulin, dextrose, bicarb and lokelma in the ED.RapidCOVID and flu neg in ED.   Outpt HD: [facility] TTS 160 BFR 400 DRF 900 EDW 83.5 3hr 45 min 2K, 2.25 Ca Heparin 4000u IV qtx  Assessment/ Plan:   1) Hyperkalemia:rx acute HD last night.  2)ESRD on HD:  HD 1/19 net 2.1L   Next  HD today on TTS schedule.  From renal standpoint she should be able to receive outpt hd on sat.  3)Anemia: Hb 13.4. not on ESA  4)BMM: CorrCa ok. Last outptphos 8.2. Last [id number]. Renal diet and auryxia.   Subjective:   Feels a little better today. Denies f/c/n/v/dyspnea   Objective:   BP (!) 152/62 (BP Location: Right Arm)   Pulse 60   Temp 97.6 F (36.4 C) (Axillary)   Resp 18   Ht 5' (1.524 m)   Wt 88.1 kg   SpO2 98%   BMI 37.93 kg/m   Intake/Output Summary (Last 24 hours) at 04/06/2019 1040 Last data filed at 04/05/2019 1200 Gross per 24 hour  Intake 240 ml  Output --  Net 240 ml   Weight change: -1.1 kg  Physical Exam: ELF:[id number] ill appearing woman Neck:thick CV:RRR, no rub NID:POEU, obese Lungs: clear Extr: 1+ tibial edema Access: RIJ TC  c/d/i  Imaging: DG Lumbar Spine 2-3 Views  Result Date: 04/05/2019 CLINICAL DATA:  Back pain status post fall. EXAM: LUMBAR SPINE - 2-3 VIEW COMPARISON:  None. FINDINGS: There is no evidence of an acute lumbar spine fracture. Mild dextroscoliosis of the lower lumbar spine is noted. Mild multilevel endplate sclerosis is seen. This is slightly more prominent at the level of L3-L4. Marked severity intervertebral disc space narrowing is seen at the levels of L4-L5 and L5-S1. IMPRESSION: Multilevel degenerative changes, most prominent at the levels of L4-L5 and L5-S1. Electronically Signed   By:  Virgina Norfolk M.D.   On: 04/05/2019 18:59    Labs: BMET Recent Labs  Lab 04/03/19 1715 04/03/19 2312 04/04/19 0817 04/04/19 1353 04/05/19 0301 04/05/19 1144 04/06/19 0156  NA 133*  --   --  135  --  137  --   K >7.5* 4.2  --  6.0*  --  4.9  --   CL 90*  --   --  95*  --  99  --   CO2 17*  --   --  25  --  23  --   GLUCOSE 75  --   --  99  --  86  --   BUN 133*  --   --  57*  --  35*  --   CREATININE 16.69*  --   --  10.38*  --  7.47*  --   CALCIUM 8.1*  --   --  8.0*  --  8.0*  --   PHOS  --   --  7.7* 7.9* 5.0*  --  5.3*   CBC Recent Labs  Lab 04/03/19 1715 04/04/19 0817 04/05/19 0301 04/06/19 0156  WBC 13.3* 8.5  8.5 7.4 6.6  NEUTROABS 11.2*  --   --   --   HGB 13.4 11.2*  11.3* 12.1 11.6*  HCT 41.1 34.5*  34.6* 37.8 35.4*  MCV 103.8* 102.7*  103.0* 104.7* 103.2*  PLT 286 189  181  176 134*    Medications:    . calcium acetate  667 mg Oral TID WC  . Chlorhexidine Gluconate Cloth  6 each Topical Q0600  . cinacalcet  90 mg Oral QHS  . diclofenac Sodium  2 g Topical QID  . ferric citrate  420 mg Oral TID WC  . gabapentin  100 mg Oral QHS  . heparin  5,000 Units Subcutaneous Q8H  . levothyroxine  25 mcg Oral QAC breakfast  . midodrine  10 mg Oral 2 times per day on Tue Thu Sat  . sertraline  100 mg Oral Daily  . vitamin B-12  1,000 mcg Oral Daily      Otelia Santee, MD 04/06/2019, 10:40 AM

## 2019-04-06 NOTE — Progress Notes (Signed)
PT Cancellation Note  Patient Details Name: Mary Wilcox MRN: [id number] DOB: [birth year]   Cancelled Treatment:    Reason Eval/Treat Not Completed: Patient at procedure or test/unavailable.  Pt had gone to HD at time of arrival.  Will attempt to see pt later if able. 04/06/2019  Ginger Carne., PT Acute Rehabilitation Services [phone]  (pager) [phone]  (office)   [name] 04/06/2019, 12:34 PM

## 2019-04-06 NOTE — Progress Notes (Signed)
Physical Therapy Treatment Patient Details Name: Mary Wilcox MRN: [id number] DOB: 1955/08/19 Today's Date: 04/06/2019    History of Present Illness Pt is a 64 y/o female admitted secondary to weakness and fall. Found to be hyperkalemic. CT of head and Cspine negative for acute abnormality. PMH includes ESRD on HD TTS, cervical cancer, PVD, and s/p L AV fistula placement.     PT Comments    Pt still adamant she will go home.  She was fatigued from HD and say this is typical and she will be better tomorrow.  Knows she needs to determine if she needs to use RW or cane.  Will complete stairs tomorrow.    Follow Up Recommendations  Home health PT     Equipment Recommendations  None recommended by PT;Other (comment)(SHE HAS ORDERED A RW.)    Recommendations for Other Services       Precautions / Restrictions Precautions Precautions: Fall    Mobility  Bed Mobility Overal bed mobility: Needs Assistance Bed Mobility: Supine to Sit;Sit to Supine     Supine to sit: Modified independent (Device/Increase time) Sit to supine: Modified independent (Device/Increase time)   General bed mobility comments: no physical assist  HOB 0, no rails  Transfers Overall transfer level: Needs assistance Equipment used: Rolling walker (2 wheeled) Transfers: Sit to/from Stand Sit to Stand: Supervision         General transfer comment: SupervisionA for safety  Ambulation/Gait Ambulation/Gait assistance: Supervision Gait Distance (Feet): 100 Feet Assistive device: Rolling walker (2 wheeled) Gait Pattern/deviations: Step-through pattern Gait velocity: moderate Gait velocity interpretation: <1.8 ft/sec, indicate of risk for recurrent falls General Gait Details: generally steady, pt feeling fatigued and unsteady, but not visibly so.   Stairs             Wheelchair Mobility    Modified Rankin (Stroke Patients Only)       Balance     Sitting balance-Leahy Scale: Good        Standing balance-Leahy Scale: Fair                              Cognition Arousal/Alertness: Awake/alert Behavior During Therapy: WFL for tasks assessed/performed Overall Cognitive Status: Within Functional Limits for tasks assessed                                        Exercises      General Comments        Pertinent Vitals/Pain Pain Assessment: Faces Pain Score: 0-No pain Faces Pain Scale: No hurt    Home Living                      Prior Function            PT Goals (current goals can now be found in the care plan section) Acute Rehab PT Goals PT Goal Formulation: With patient Time For Goal Achievement: 04/18/19 Potential to Achieve Goals: Good Progress towards PT goals: Progressing toward goals    Frequency    Min 3X/week      PT Plan Current plan remains appropriate    Co-evaluation              AM-PAC PT "6 Clicks" Mobility   Outcome Measure  Help needed turning from your back to your side while in a flat bed  without using bedrails?: None Help needed moving from lying on your back to sitting on the side of a flat bed without using bedrails?: None Help needed moving to and from a bed to a chair (including a wheelchair)?: None Help needed standing up from a chair using your arms (e.g., wheelchair or bedside chair)?: None Help needed to walk in hospital room?: None Help needed climbing 3-5 steps with a railing? : A Little 6 Click Score: 23    End of Session   Activity Tolerance: Patient tolerated treatment well;Patient limited by fatigue Patient left: in bed;with call bell/phone within reach Nurse Communication: Mobility status PT Visit Diagnosis: Unsteadiness on feet (R26.81);Muscle weakness (generalized) (M62.81)     Time: 2574-9355 PT Time Calculation (min) (ACUTE ONLY): 14 min  Charges:  $Gait Training: 8-22 mins                     04/06/2019  Ginger Carne., PT Acute Rehabilitation  Services [phone]  (pager) [phone]  (office)   Tessie Fass Blimi Godby 04/06/2019, 5:58 PM

## 2019-04-06 NOTE — Progress Notes (Signed)
PROGRESS NOTE    Mary Wilcox  WCB:[id number] DOB: 1955-12-26 DOA: 04/03/2019 PCP: Kristen Loader, FNP   Brief Narrative:  HPI on 04/03/2019 by Dr. Clinton Sawyer Mary Wilcox is a 64 y.o. female with medical history significant for ESRD on hemodialysis Tuesday Thursday Saturday [place], remote history of cervical cancer who presents with a chief complaint of weakness and inability to stand, found to have severe hyperkalemia.  She reports she normally walks that assistance, though missed dialysis on Saturday and today has been weak.  She reports this weakness was associated with a fall yesterday as well, reportedly mechanical involving the back of her head after she felt a little dizzy and lost balance.  No syncope, loss of consciousness or residual pain. Of note, at her regular hemodialysis site her right-sided PermCath has been intermittently malfunctioning, reportedly leading to less ultrafiltration and early discontinue dialysis sessions. Due to these complaints, she presented the emergency department today, where an EKG was obtained with peak T waves and a potassium measured at greater than 7.5. Otherwise denies fevers, chills, GI issues, myalgias.  Denies headache, vision changes, residual head pain.  Reports adherence to low potassium diet.  Interim history Patient admitted with hyperkalemia due to missed hemodialysis.  Now pending further work with physical therapy so that she can be discharged home safely. Assessment & Plan   Severe hyperkalemia/uremia secondary to missed hemodialysis -Patient presented after missing hemodialysis and recent PermCath malfunction, found to have weakness with associated hyperkalemia potassium greater than 7.5, EKG changes with peaked T waves as well as a BUN significantly above her baseline and anion gap metabolic acidosis -Nephrology consulted and appreciated -Patient did undergo hemodialysis and is back on a Tuesday Thursday, Saturday  schedule  ESRD -As above nephrology consulted and appreciated, TTS  Weakness -Likely secondary to the above, CT head and neck were unrevealing -PT initially recommended SNF however patient refused -PT working with patient in hopes that she will improve her home health therapy  Back pain -Continue Voltaren gel -X-ray of the lower back showed multilevel degenerative changes, most prominent levels of L4-5, L5-S1  Anemia of chronic disease -Hemoglobin stable  Pressure injury -Present on admission -Stage I on buttocks  Obesity -BMI of >35 -Patient to follow-up with her PCP to discuss lifestyle modifications   DVT Prophylaxis  heparin  Code Status: Full  Family Communication: None at bedside  Disposition Plan: Admitted. Pending progression with PT. Likely home with home health on 1/22  Consultants Nephrology  Procedures  None  Antibiotics   Anti-infectives (From admission, onward)   None      Subjective:   Mary Wilcox seen and examined today.  Patient states she is feeling better. Has occasional back pain. . Denies chest pain, shortness of breath, abdominal pain, nausea, vomiting, dizziness, headache.   Objective:   Vitals:   04/06/19 0417 04/06/19 0500 04/06/19 0819 04/06/19 1121  BP: (!) 143/63  (!) 152/62 (!) 142/63  Pulse: 61  60 61  Resp: 15  18 16   Temp: 97.9 F (36.6 C)  97.6 F (36.4 C) 98 F (36.7 C)  TempSrc: Oral  Axillary Oral  SpO2: 97%  98% 93%  Weight:  88.1 kg    Height:       No intake or output data in the 24 hours ending 04/06/19 1212 Filed Weights   04/04/19 1650 04/05/19 0417 04/06/19 0500  Weight: 86.7 kg 89.1 kg 88.1 kg    Exam  General: Well developed,  chronically ill appearing, NAD  HEENT: NCAT, mucous membranes moist.   Cardiovascular: S1 S2 auscultated, RRR  Respiratory: Clear to auscultation bilaterally   Abdomen: Soft, obese, nontender, nondistended, + bowel sounds  Extremities: warm dry without cyanosis  clubbing. +Edema  Neuro: AAOx3, nonfocal  Psych: Appropriate mood and affect   Data Reviewed: I have personally reviewed following labs and imaging studies  CBC: Recent Labs  Lab 04/03/19 1715 04/04/19 0817 04/05/19 0301 04/06/19 0156  WBC 13.3* 8.5  8.5 7.4 6.6  NEUTROABS 11.2*  --   --   --   HGB 13.4 11.2*  11.3* 12.1 11.6*  HCT 41.1 34.5*  34.6* 37.8 35.4*  MCV 103.8* 102.7*  103.0* 104.7* 103.2*  PLT 286 189  181 176 960*   Basic Metabolic Panel: Recent Labs  Lab 04/03/19 1715 04/03/19 2312 04/04/19 0817 04/04/19 1353 04/05/19 0301 04/05/19 1144 04/06/19 0156  NA 133*  --   --  135  --  137  --   K >7.5* 4.2  --  6.0*  --  4.9  --   CL 90*  --   --  95*  --  99  --   CO2 17*  --   --  25  --  23  --   GLUCOSE 75  --   --  99  --  86  --   BUN 133*  --   --  57*  --  35*  --   CREATININE 16.69*  --   --  10.38*  --  7.47*  --   CALCIUM 8.1*  --   --  8.0*  --  8.0*  --   MG 2.9*  --  2.0  --  1.9  --  2.1  PHOS  --   --  7.7* 7.9* 5.0*  --  5.3*   GFR: Estimated Creatinine Clearance: 7.4 mL/min (A) (by C-G formula based on SCr of 7.47 mg/dL (H)). Liver Function Tests: Recent Labs  Lab 04/03/19 1715 04/04/19 1353  AST 13*  --   ALT 15  --   ALKPHOS 154*  --   BILITOT 0.7  --   PROT 7.4  --   ALBUMIN 4.1 3.0*   No results for input(s): LIPASE, AMYLASE in the last 168 hours. No results for input(s): AMMONIA in the last 168 hours. Coagulation Profile: No results for input(s): INR, PROTIME in the last 168 hours. Cardiac Enzymes: No results for input(s): CKTOTAL, CKMB, CKMBINDEX, TROPONINI in the last 168 hours. BNP (last 3 results) No results for input(s): PROBNP in the last 8760 hours. HbA1C: No results for input(s): HGBA1C in the last 72 hours. CBG: No results for input(s): GLUCAP in the last 168 hours. Lipid Profile: No results for input(s): CHOL, HDL, LDLCALC, TRIG, CHOLHDL, LDLDIRECT in the last 72 hours. Thyroid Function Tests: No  results for input(s): TSH, T4TOTAL, FREET4, T3FREE, THYROIDAB in the last 72 hours. Anemia Panel: No results for input(s): VITAMINB12, FOLATE, FERRITIN, TIBC, IRON, RETICCTPCT in the last 72 hours. Urine analysis:    Component Value Date/Time   COLORURINE YELLOW 04/12/2011 0335   APPEARANCEUR TURBID (A) 04/12/2011 0335   LABSPEC 1.008 04/12/2011 0335   PHURINE 8.5 (H) 04/12/2011 0335   GLUCOSEU NEGATIVE 04/12/2011 0335   HGBUR LARGE (A) 04/12/2011 0335   BILIRUBINUR NEGATIVE 04/12/2011 0335   KETONESUR NEGATIVE 04/12/2011 0335   PROTEINUR 100 (A) 04/12/2011 0335   UROBILINOGEN 0.2 04/12/2011 0335   NITRITE NEGATIVE 04/12/2011 0335  LEUKOCYTESUR LARGE (A) 04/12/2011 0335   Sepsis Labs: @LABRCNTIP (procalcitonin:4,lacticidven:4)  ) Recent Results (from the past 240 hour(s))  Respiratory Panel by RT PCR (Flu A&B, Covid) - Nasopharyngeal Swab     Status: None   Collection Time: 04/03/19  4:34 PM   Specimen: Nasopharyngeal Swab  Result Value Ref Range Status   SARS Coronavirus 2 by RT PCR NEGATIVE NEGATIVE Final    Comment: (NOTE) SARS-CoV-2 target nucleic acids are NOT DETECTED. The SARS-CoV-2 RNA is generally detectable in upper respiratoy specimens during the acute phase of infection. The lowest concentration of SARS-CoV-2 viral copies this assay can detect is 131 copies/mL. A negative result does not preclude SARS-Cov-2 infection and should not be used as the sole basis for treatment or other patient management decisions. A negative result may occur with  improper specimen collection/handling, submission of specimen other than nasopharyngeal swab, presence of viral mutation(s) within the areas targeted by this assay, and inadequate number of viral copies (<131 copies/mL). A negative result must be combined with clinical observations, patient history, and epidemiological information. The expected result is Negative. Fact Sheet for Patients:   PinkCheek.be Fact Sheet for Healthcare Providers:  GravelBags.it This test is not yet ap proved or cleared by the [place] FDA and  has been authorized for detection and/or diagnosis of SARS-CoV-2 by FDA under an Emergency Use Authorization (EUA). This EUA will remain  in effect (meaning this test can be used) for the duration of the COVID-19 declaration under Section 564(b)(1) of the Act, 21 U.S.C. section 360bbb-3(b)(1), unless the authorization is terminated or revoked sooner.    Influenza A by PCR NEGATIVE NEGATIVE Final   Influenza B by PCR NEGATIVE NEGATIVE Final    Comment: (NOTE) The Xpert Xpress SARS-CoV-2/FLU/RSV assay is intended as an aid in  the diagnosis of influenza from Nasopharyngeal swab specimens and  should not be used as a sole basis for treatment. Nasal washings and  aspirates are unacceptable for Xpert Xpress SARS-CoV-2/FLU/RSV  testing. Fact Sheet for Patients: PinkCheek.be Fact Sheet for Healthcare Providers: GravelBags.it This test is not yet approved or cleared by the [place] FDA and  has been authorized for detection and/or diagnosis of SARS-CoV-2 by  FDA under an Emergency Use Authorization (EUA). This EUA will remain  in effect (meaning this test can be used) for the duration of the  Covid-19 declaration under Section 564(b)(1) of the Act, 21  U.S.C. section 360bbb-3(b)(1), unless the authorization is  terminated or revoked. Performed at [facility], [place] [address] [place], [place] [address]       Radiology Studies: DG Lumbar Spine 2-3 Views  Result Date: 04/05/2019 CLINICAL DATA:  Back pain status post fall. EXAM: LUMBAR SPINE - 2-3 VIEW COMPARISON:  None. FINDINGS: There is no evidence of an acute lumbar spine fracture. Mild dextroscoliosis of the lower lumbar spine is noted. Mild multilevel endplate sclerosis is  seen. This is slightly more prominent at the level of L3-L4. Marked severity intervertebral disc space narrowing is seen at the levels of L4-L5 and L5-S1. IMPRESSION: Multilevel degenerative changes, most prominent at the levels of L4-L5 and L5-S1. Electronically Signed   By: Virgina Norfolk M.D.   On: 04/05/2019 18:59     Scheduled Meds: . calcium acetate  667 mg Oral TID WC  . Chlorhexidine Gluconate Cloth  6 each Topical Q0600  . cinacalcet  90 mg Oral QHS  . diclofenac Sodium  2 g Topical QID  . ferric citrate  420  mg Oral TID WC  . gabapentin  100 mg Oral QHS  . heparin  5,000 Units Subcutaneous Q8H  . levothyroxine  25 mcg Oral QAC breakfast  . midodrine  10 mg Oral 2 times per day on Tue Thu Sat  . sertraline  100 mg Oral Daily  . vitamin B-12  1,000 mcg Oral Daily   Continuous Infusions:   LOS: 2 days   Time Spent in minutes   30 minutes  Haylin Camilli D.O. on 04/06/2019 at 12:12 PM  Between 7am to 7pm - Please see pager noted on [email or web address]  After 7pm go to [email or web address]  And look for the night coverage person covering for me after hours  [facility] Office  [phone]

## 2019-04-07 ENCOUNTER — Other Ambulatory Visit (HOSPITAL_COMMUNITY): Payer: Medicare Other

## 2019-04-07 ENCOUNTER — Encounter: Payer: Medicare Other | Admitting: Vascular Surgery

## 2019-04-07 ENCOUNTER — Encounter (HOSPITAL_COMMUNITY): Payer: Medicare Other

## 2019-04-07 DIAGNOSIS — E875 Hyperkalemia: Secondary | ICD-10-CM | POA: Diagnosis not present

## 2019-04-07 DIAGNOSIS — Z992 Dependence on renal dialysis: Secondary | ICD-10-CM | POA: Diagnosis not present

## 2019-04-07 DIAGNOSIS — M545 Low back pain: Secondary | ICD-10-CM

## 2019-04-07 DIAGNOSIS — N186 End stage renal disease: Secondary | ICD-10-CM | POA: Diagnosis not present

## 2019-04-07 LAB — RENAL FUNCTION PANEL
Albumin: 3.2 g/dL — ABNORMAL LOW (ref 3.5–5.0)
Anion gap: 16 — ABNORMAL HIGH (ref 5–15)
BUN: 43 mg/dL — ABNORMAL HIGH (ref 8–23)
CO2: 22 mmol/L (ref 22–32)
Calcium: 7.7 mg/dL — ABNORMAL LOW (ref 8.9–10.3)
Chloride: 95 mmol/L — ABNORMAL LOW (ref 98–111)
Creatinine, Ser: 8.05 mg/dL — ABNORMAL HIGH (ref 0.44–1.00)
GFR calc Af Amer: 6 mL/min — ABNORMAL LOW (ref >60)
GFR calc non Af Amer: 5 mL/min — ABNORMAL LOW (ref >60)
Glucose, Bld: 84 mg/dL (ref 70–99)
Phosphorus: 5.5 mg/dL — ABNORMAL HIGH (ref 2.5–4.6)
Potassium: 4.3 mmol/L (ref 3.5–5.1)
Sodium: 133 mmol/L — ABNORMAL LOW (ref 135–145)

## 2019-04-07 LAB — MAGNESIUM: Magnesium: 2.2 mg/dL (ref 1.7–2.4)

## 2019-04-07 LAB — HEPATITIS B SURFACE ANTIBODY, QUANTITATIVE: Hep B S AB Quant (Post): 45.7 m[IU]/mL (ref >9.9)

## 2019-04-07 NOTE — Plan of Care (Signed)
  Problem: Health Behavior/Discharge Planning: Goal: Ability to manage health-related needs will improve Note: Received discharge order for patient. Discharge instructions reviewed with patient. Patient with no further questions at this time. Pt stable in no acute distress. PIV removed, intact. R HD cath intact, dsg c/d/I. Pt off unit in wheelchair. Nursing care complete.

## 2019-04-07 NOTE — Progress Notes (Signed)
[facility] Progress Note   64 y.o.femalewith ESRD on HD TTS [facility], remote h/o cervical ca who is seen for evaluation and management of severe hyperkalemiaw/peaked T waves. Receiving IV ca, IV insulin, dextrose, bicarb and lokelma in the ED.RapidCOVID and flu neg in ED.   Outpt HD: [facility] TTS 160 BFR 400 DRF 900 EDW 83.5 3hr 45 min 2K, 2.25 Ca Heparin 4000u IV qtx  Assessment/ Plan:   1) Hyperkalemia:rx acute HD last night.  2)ESRD on HD:  HD 1/19 net 2.1L, 1/21 2L   Next HD today on TTS schedule at outpt hd on sat.Marland Kitchen  3)Anemia: Hb 13.4. not on ESA  4)BMM: CorrCa ok. Last outptphos 8.2. Last VEH209. Renal diet and auryxia.    Subjective:   Feels better today. Denies f/c/n/v/dyspnea   Objective:   BP 136/82 (BP Location: Right Arm)   Pulse (!) 54   Temp 98 F (36.7 C) (Oral)   Resp 15   Ht 5' (1.524 m)   Wt 86.1 kg Comment: standing  SpO2 100%   BMI 37.07 kg/m   Intake/Output Summary (Last 24 hours) at 04/07/2019 1016 Last data filed at 04/06/2019 1503 Gross per 24 hour  Intake --  Output 2000 ml  Net -2000 ml   Weight change: 0.1 kg  Physical Exam: OBS:[id number] ill appearing woman Neck:thick CV:RRR, no rub MLY:YTKP, obese Lungs: clear Extr: 1+ tibial edema Access: RIJ TCc/d/  Imaging: DG Lumbar Spine 2-3 Views  Result Date: 04/05/2019 CLINICAL DATA:  Back pain status post fall. EXAM: LUMBAR SPINE - 2-3 VIEW COMPARISON:  None. FINDINGS: There is no evidence of an acute lumbar spine fracture. Mild dextroscoliosis of the lower lumbar spine is noted. Mild multilevel endplate sclerosis is seen. This is slightly more prominent at the level of L3-L4. Marked severity intervertebral disc space narrowing is seen at the levels of L4-L5 and L5-S1. IMPRESSION: Multilevel degenerative changes, most prominent at the levels of L4-L5 and L5-S1. Electronically Signed   By: Virgina Norfolk M.D.   On:  04/05/2019 18:59    Labs: BMET Recent Labs  Lab 04/03/19 1715 04/03/19 2312 04/04/19 0817 04/04/19 1353 04/05/19 0301 04/05/19 1144 04/06/19 0156 04/06/19 1225 04/07/19 0710  NA 133*  --   --  135  --  137  --  135 133*  K >7.5* 4.2  --  6.0*  --  4.9  --  5.1 4.3  CL 90*  --   --  95*  --  99  --  97* 95*  CO2 17*  --   --  25  --  23  --  23 22  GLUCOSE 75  --   --  99  --  86  --  77 84  BUN 133*  --   --  57*  --  35*  --  65* 43*  CREATININE 16.69*  --   --  10.38*  --  7.47*  --  10.18* 8.05*  CALCIUM 8.1*  --   --  8.0*  --  8.0*  --  7.6* 7.7*  PHOS  --   --  7.7* 7.9* 5.0*  --  5.3* 5.6* 5.5*   CBC Recent Labs  Lab 04/03/19 1715 04/04/19 0817 04/05/19 0301 04/06/19 0156  WBC 13.3* 8.5  8.5 7.4 6.6  NEUTROABS 11.2*  --   --   --   HGB 13.4 11.2*  11.3* 12.1 11.6*  HCT 41.1 34.5*  34.6* 37.8 35.4*  MCV  103.8* 102.7*  103.0* 104.7* 103.2*  PLT 286 189  181 176 134*    Medications:    . calcium acetate  667 mg Oral TID WC  . Chlorhexidine Gluconate Cloth  6 each Topical Q0600  . cinacalcet  90 mg Oral QHS  . diclofenac Sodium  2 g Topical QID  . ferric citrate  420 mg Oral TID WC  . gabapentin  100 mg Oral QHS  . heparin  5,000 Units Subcutaneous Q8H  . levothyroxine  25 mcg Oral QAC breakfast  . midodrine  10 mg Oral 2 times per day on Tue Thu Sat  . sertraline  100 mg Oral Daily  . vitamin B-12  1,000 mcg Oral Daily      Otelia Santee, MD 04/07/2019, 10:16 AM

## 2019-04-07 NOTE — TOC Transition Note (Signed)
Transition of Care [facility]) - CM/SW Discharge Note Marvetta Gibbons RN,BSN Transitions of Care Unit 4NP (non trauma) - RN Case Manager [phone]   Patient Details  Name: Mary Wilcox MRN: [id number] Date of Birth: 12-05-1955  Transition of Care [facility]) CM/SW Contact:  Dawayne Patricia, RN Phone Number: 04/07/2019, 11:38 AM   Clinical Narrative:    Pt stable for transition home, recommendations updated to [place], orders have been placed for HHPT and DME- RW, CM spoke with pt at bedside- address, phone # and PCP all confirmed with pt in epic- discussed [place] with pt which pt is agreeable to, list provided for choice Per CMS guidelines from medicare.gov website with star ratings (copy placed in shadow chart)- pt ask to speak with her daughter and review list with her- provided pt with this CM name and contact # to reach after she has selected agency that she would like to use- referral will be made once CM hears from pt regarding choice- per pt she has [place] a rollator from [place] and it should arrive by tomorrow- she declines RW from here and does not wish to use her Medicare benefits to get DME at this time. [facility] Referral pending.     Final next level of care: [place] Barriers to Discharge: Barriers Resolved, No Barriers Identified   Patient Goals and CMS Choice Patient states their goals for this hospitalization and ongoing recovery are:: return home CMS Medicare.gov Compare Post Acute Care list provided to:: Patient Choice offered to / list presented to : Patient  Discharge Placement               Home with [facility]        Discharge Plan and Services In-house Referral: Clinical Social Work Discharge Planning Services: CM Consult Post Acute Care Choice: Durable Medical Equipment, Home Health          DME Arranged: Walker rolling DME Agency: Other - Comment(pt declined- states has ordered one from [employer])       [place] Arranged: PT          Social  Determinants of Health ([place]) Interventions     Readmission Risk Interventions Readmission Risk Prevention Plan 04/07/2019 04/04/2019 02/13/2019  Transportation Screening - Complete Complete  PCP or Specialist Appt within 3-5 Days Complete Not Complete Complete  Not Complete comments - plan for SNF -  [place] or [place] - Complete Complete  Social Work Consult for [place] Planning/Counseling - Complete Complete  Palliative Care Screening - Not Applicable Not Applicable  Medication Review (RN Care Manager) Complete Referral to Pharmacy Complete  Some recent data might be hidden

## 2019-04-07 NOTE — Discharge Instructions (Signed)
Hyperkalemia Hyperkalemia occurs when the level of potassium in your blood is too high. Potassium is an important nutrient that helps the muscles and nerves function normally. It affects how the heart works, and it helps keep fluids and minerals balanced in the body. If there is too much potassium in your blood, it can affect your heart's ability to function normally. Potassium is normally removed (excreted) from the body by the kidneys. Hyperkalemia can result from various conditions. It can range from mild to severe. What are the causes? This condition may be caused by:  Taking in too much potassium. You can do this by: ? Using salt substitutes. They contain large amounts of potassium. ? Taking potassium supplements. ? Eating foods that are high in potassium.  Excreting too little potassium. This can happen if: ? Your kidneys are not working properly. Kidney (renal) disease, including short-term or long-term renal failure, is a common cause of hyperkalemia. ? You are taking medicines that lower your excretion of potassium. ? You have Addison's disease. ? You have a urinary tract blockage, such as kidney stones. ? You are on treatment to mechanically clean your blood (dialysis) and you skip a treatment.  Releasing a high amount of potassium from your cells into your blood. This can happen with: ? Injury to muscles (rhabdomyolysis) or other tissues. Most potassium is stored in your muscles. ? Severe burns or infections. ? Acidic blood plasma (acidosis). Acidosis can result from many diseases, such as uncontrolled diabetes. What increases the risk? The following factors may make you more likely to develop this condition:  Kidney disease. This puts you at the highest risk.  Addison's disease. This is a condition where the adrenal glands do not produce enough hormones.  Alcoholism or heavy drug use.  Using certain blood pressure medicines, such as ACE inhibitors, angiotensin II receptor  blockers (ARBs), or potassium-sparing diuretics such as spironolactone.  Severe injury or burn. What are the signs or symptoms? In many cases, there are no symptoms. However, when your potassium level becomes high enough, you may have symptoms such as:  An irregular or very slow heartbeat.  Nausea.  Tiredness (fatigue).  Confusion.  Tingling of your skin or numbness of your hands or feet.  Muscle cramps.  Muscle weakness.  Not being able to move (paralysis). How is this diagnosed? This condition may be diagnosed based on:  Your symptoms and medical history. Your health care provider will ask about your use of prescription and non-prescription drugs.  A physical exam.  Blood tests.  An electrocardiogram (ECG). How is this treated? Treatment depends on the cause and severity of your condition. Treatment may need to be done in the hospital setting. Treatment may include:  IV glucose (sugar) along with insulin to shift potassium out of your blood and into your cells.  A medicine called albuterol to shift potassium out of your blood and into your cells.  Medicines to remove the potassium from your body.  Dialysis to remove the potassium from your body.  Calcium to protect your heart from the effects of high potassium, such as irregular rhythms (arrhythmias). Follow these instructions at home:   Take over-the-counter and prescription medicines only as told by your health care provider.  Do not take any supplements, natural products, herbs, or vitamins without reviewing them with your health care provider. Certain supplements and natural food products contain high amounts of potassium.  Limit your alcohol intake as told by your health care provider.  Do not use  drugs. If you need help quitting, ask your health care provider.  If you have kidney disease, you may need to follow a low-potassium diet. A dietitian can help you learn which foods have high or low amounts of  potassium.  Keep all follow-up visits as told by your health care provider. This is important. Contact a health care provider if you:  Have an irregular or very slow heartbeat.  Feel light-headed.  Feel weak.  Are nauseous.  Have tingling or numbness in your hands or feet. Get help right away if you:  Have shortness of breath.  Have chest pain or discomfort.  Pass out.  Have muscle paralysis. Summary  Hyperkalemia occurs when the level of potassium in your blood is too high.  This condition may be caused by taking in too much potassium, excreting too little potassium, or releasing a high amount of potassium from your cells into your blood.  Hyperkalemia can result from many underlying conditions, especially chronic kidney disease, or from taking certain medicines.  Treatment of hyperkalemia may include medicine to shift potassium out of your blood and into your cells or to remove the potassium from your body.  If you have kidney disease, you may need to follow a low-potassium diet. A dietitian can help you learn which foods have high or low amounts of potassium. This information is not intended to replace advice given to you by your health care provider. Make sure you discuss any questions you have with your health care provider. Document Revised: 02/15/2017 Document Reviewed: 02/15/2017 Elsevier Patient Education  [place].

## 2019-04-07 NOTE — Discharge Summary (Signed)
Physician Discharge Summary  Mary Wilcox JOI:[id number] DOB: [birth year] DOA: 04/03/2019  PCP: Kristen Loader, FNP  Admit date: 04/03/2019 Discharge date: 04/07/2019  Time spent: 45 minutes  Recommendations for Outpatient Follow-up:  Patient will be discharged to home with home health physical thearpy.  Patient will need to follow up with primary care provider within one week of discharge.  Continue dialysis as scheduled. Patient should continue medications as prescribed.  Patient should follow a renal diet.    Discharge Diagnoses:  Severe hyperkalemia/uremia secondary to missed hemodialysis ESRD Weakness Back pain Anemia of chronic disease Pressure injury Obesity  Discharge Condition: stable  Diet recommendation: renal  Filed Weights   04/06/19 0500 04/06/19 1200 04/06/19 1503  Weight: 88.1 kg 88.2 kg 86.1 kg    History of present illness:  on 04/03/2019 by Dr. Lizabeth Leyden a 64 y.o.femalewith medical history significantfor ESRD on hemodialysis Tuesday Thursday Saturday [place] [facility], remote history of cervical cancerwho presents with a chief complaint of weakness and inability to stand, found to have severe hyperkalemia. She reports she normally walks that assistance, though missed dialysis on Saturday and today has been weak. She reports this weakness was associated with a fall yesterday as well, reportedly mechanical involving the back of her head after she felt a little dizzy and lost balance. No syncope, loss ofconsciousnessor residual pain. Of note, at her regular hemodialysis site her right-sided PermCath has been intermittently malfunctioning, reportedly leading to less ultrafiltration and early discontinue dialysis sessions. Due to these complaints, she presented the emergency department today, where an EKG was obtained with peak T waves and a potassium measured at greater than 7.5. Otherwise denies fevers, chills, GI issues, myalgias.Denies  headache, vision changes, residual head pain. Reports adherence to low potassium diet.  Hospital Course:  Severe hyperkalemia/uremia secondary to missed hemodialysis -Patient presented after missing hemodialysis and recent PermCath malfunction, found to have weakness with associated hyperkalemia potassium greater than 7.5, EKG changes with peaked T waves as well as a BUN significantly above her baseline and anion gap metabolic acidosis -Nephrology consulted and appreciated -Patient did undergo hemodialysis and is back on a Tuesday Thursday, Saturday schedule  ESRD -As above nephrology consulted and appreciated, TTS  Weakness -Likely secondary to the above, CT head and neck were unrevealing -PT initially recommended SNF however patient refused -PT working with patient in hopes that she will improve her home health therapy  Back pain -Continue Voltaren gel -X-ray of the lower back showed multilevel degenerative changes, most prominent levels of L4-5, L5-S1  Anemia of chronic disease -Hemoglobin stable  Pressure injury -Present on admission -Stage I on buttocks  Obesity -BMI of >35 -Patient to follow-up with her PCP to discuss lifestyle modifications  Code status: Full  Consultants Nephrology  Procedures  None   Discharge Exam: Vitals:   04/07/19 0307 04/07/19 0739  BP: (!) 147/74 136/82  Pulse: (!) 53 (!) 54  Resp: 13 15  Temp: 97.8 F (36.6 C) 98 F (36.7 C)  SpO2: 100% 100%     General: Well developed, chronically ill appearing, NAD  HEENT: NCAT, mucous membranes moist.  Cardiovascular: S1 S2 auscultated, RRR  Respiratory: Clear to auscultation bilaterally   Abdomen: Soft, obese, nontender, nondistended, + bowel sounds  Extremities: warm dry without cyanosis clubbing  Neuro: AAOx3, nonfocal  Psych: Appropriate mood and affect  Discharge Instructions Discharge Instructions    Discharge instructions   Complete by: As directed    lower  back pain, hyperkalemia,  ESRD     Allergies as of 04/07/2019      Reactions   Prozac [fluoxetine Hcl] Hives      Medication List    TAKE these medications   acetaminophen 500 MG tablet Commonly known as: TYLENOL Take 1,000 mg by mouth every 6 (six) hours as needed for headache (pain).   Auryxia 1 GM 210 MG(Fe) tablet Generic drug: ferric citrate Take 210-420 mg by mouth See admin instructions. Take two tablets (420 mg) by mouth three times daily with meals and one tablet (210 mg) up to 3 times daily with snacks   calcium acetate 667 MG capsule Commonly known as: PHOSLO Take 667 mg by mouth See admin instructions. Take one capsule (667 mg) by mouth three times daily with meals and 2-3 times with snacks.   cinacalcet 90 MG tablet Commonly known as: SENSIPAR Take 90 mg by mouth at bedtime.   gabapentin 100 MG capsule Commonly known as: NEURONTIN Take 100 mg by mouth at bedtime. For leg pain   levothyroxine 25 MCG tablet Commonly known as: SYNTHROID Take 25 mcg by mouth daily before breakfast.   midodrine 10 MG tablet Commonly known as: PROAMATINE Take 10 mg by mouth See admin instructions. Take one tablet (10 mg) by mouth twice on dialysis days (Tuesday, Thursday, Saturday) at 4am and 8am   sertraline 100 MG tablet Commonly known as: ZOLOFT Take 100 mg by mouth daily.   SUPER B COMPLEX PO Take 1 tablet by mouth daily.   vitamin B-12 1000 MCG tablet Commonly known as: CYANOCOBALAMIN Take 1,000 mcg by mouth daily.   zolpidem 10 MG tablet Commonly known as: AMBIEN Take 10 mg by mouth at bedtime as needed for sleep.            Durable Medical Equipment  (From admission, onward)         Start     Ordered   04/07/19 0855  DME Gilford Rile  Once    Question Answer Comment  Walker: With 5 Inch Wheels   Patient needs a walker to treat with the following condition Physical deconditioning      04/07/19 0855         Allergies  Allergen Reactions  . Prozac  [Fluoxetine Hcl] Hives   Follow-up Information    Kristen Loader, FNP. Schedule an appointment as soon as possible for a visit in 1 week(s).   Specialty: Family Medicine Why: Hospital follow up Contact information: [place] [address] [phone]            The results of significant diagnostics from this hospitalization (including imaging, microbiology, ancillary and laboratory) are listed below for reference.    Significant Diagnostic Studies: DG Lumbar Spine 2-3 Views  Result Date: 04/05/2019 CLINICAL DATA:  Back pain status post fall. EXAM: LUMBAR SPINE - 2-3 VIEW COMPARISON:  None. FINDINGS: There is no evidence of an acute lumbar spine fracture. Mild dextroscoliosis of the lower lumbar spine is noted. Mild multilevel endplate sclerosis is seen. This is slightly more prominent at the level of L3-L4. Marked severity intervertebral disc space narrowing is seen at the levels of L4-L5 and L5-S1. IMPRESSION: Multilevel degenerative changes, most prominent at the levels of L4-L5 and L5-S1. Electronically Signed   By: Virgina Norfolk M.D.   On: 04/05/2019 18:59   CT Head Wo Contrast  Result Date: 04/03/2019 CLINICAL DATA:  Status post trauma. EXAM: CT HEAD WITHOUT CONTRAST TECHNIQUE: Contiguous axial images were obtained from the  base of the skull through the vertex without intravenous contrast. COMPARISON:  None. FINDINGS: Brain: There is mild cerebral atrophy with widening of the extra-axial spaces and ventricular dilatation. There are areas of decreased attenuation within the white matter tracts of the supratentorial brain, consistent with microvascular disease changes. Vascular: No hyperdense vessel or unexpected calcification. Skull: Normal. Negative for fracture or focal lesion. Sinuses/Orbits: No acute finding. Other: None. IMPRESSION: No acute intracranial pathology. Electronically Signed   By: Virgina Norfolk M.D.   On: 04/03/2019 19:19   CT Cervical Spine  Wo Contrast  Result Date: 04/03/2019 CLINICAL DATA:  Status post trauma. EXAM: CT CERVICAL SPINE WITHOUT CONTRAST TECHNIQUE: Multidetector CT imaging of the cervical spine was performed without intravenous contrast. Multiplanar CT image reconstructions were also generated. COMPARISON:  None. FINDINGS: Alignment: Normal. Skull base and vertebrae: No acute fracture. No primary bone lesion or focal pathologic process. Soft tissues and spinal canal: No prevertebral fluid or swelling. No visible canal hematoma. Disc levels: C2-3: Normal endplates. Very mild disc space narrowing is seen. Bilateral facet hypertrophy is noted. Normal central canal and intervertebral neuroforamina. C3-4: Normal endplates. Very mild disc space narrowing is seen. Bilateral facet hypertrophy is noted. Normal central canal and intervertebral neuroforamina. C4-5: Normal endplates. Mild disc space narrowing is seen. Bilateral facet hypertrophy is noted. Normal central canal and intervertebral neuroforamina. C5-6: Normal endplates. Mild disc space narrowing is seen. Bilateral facet hypertrophy is noted. Normal central canal and intervertebral neuroforamina. C6-7: Very mild end plate spondylosis. Mild disc space narrowing is seen. Bilateral facet hypertrophy is noted. Normal central canal and intervertebral neuroforamina. C7-T1: Normal endplates. Very mild disc space narrowing is seen. Bilateral facet hypertrophy is noted. Normal central canal and intervertebral neuroforamina. Upper chest: Negative. Other: None. IMPRESSION: 1. Mild degenerative changes without evidence of acute osseous abnormality within the cervical spine. Electronically Signed   By: Virgina Norfolk M.D.   On: 04/03/2019 19:27   DG Chest Port 1 View  Result Date: 04/03/2019 CLINICAL DATA:  Weakness.  Missed dialysis. EXAM: PORTABLE CHEST 1 VIEW COMPARISON:  April 11, 2011 FINDINGS: There is a right internal jugular venous catheter with its distal tip noted just beyond the  junction of the superior vena cava and right atrium. Very mild linear scarring and/or atelectasis is seen within the lateral aspect of the right lung base. There is no evidence of a pleural effusion or pneumothorax. The heart size and mediastinal contours are within normal limits. A radiopaque vascular stent is seen overlying the left apex and left axilla. This represents a new finding when compared to the prior exam. The visualized skeletal structures are unremarkable. IMPRESSION: 1. Mild right basilar linear scarring and/or atelectasis, without acute or active cardiopulmonary disease. Electronically Signed   By: Virgina Norfolk M.D.   On: 04/03/2019 16:28    Microbiology: Recent Results (from the past 240 hour(s))  Respiratory Panel by RT PCR (Flu A&B, Covid) - Nasopharyngeal Swab     Status: None   Collection Time: 04/03/19  4:34 PM   Specimen: Nasopharyngeal Swab  Result Value Ref Range Status   SARS Coronavirus 2 by RT PCR NEGATIVE NEGATIVE Final    Comment: (NOTE) SARS-CoV-2 target nucleic acids are NOT DETECTED. The SARS-CoV-2 RNA is generally detectable in upper respiratoy specimens during the acute phase of infection. The lowest concentration of SARS-CoV-2 viral copies this assay can detect is 131 copies/mL. A negative result does not preclude SARS-Cov-2 infection and should not be used as the sole basis for  treatment or other patient management decisions. A negative result may occur with  improper specimen collection/handling, submission of specimen other than nasopharyngeal swab, presence of viral mutation(s) within the areas targeted by this assay, and inadequate number of viral copies (<131 copies/mL). A negative result must be combined with clinical observations, patient history, and epidemiological information. The expected result is Negative. Fact Sheet for Patients:  PinkCheek.be Fact Sheet for Healthcare Providers:   GravelBags.it This test is not yet ap proved or cleared by the [place] FDA and  has been authorized for detection and/or diagnosis of SARS-CoV-2 by FDA under an Emergency Use Authorization (EUA). This EUA will remain  in effect (meaning this test can be used) for the duration of the COVID-19 declaration under Section 564(b)(1) of the Act, 21 U.S.C. section 360bbb-3(b)(1), unless the authorization is terminated or revoked sooner.    Influenza A by PCR NEGATIVE NEGATIVE Final   Influenza B by PCR NEGATIVE NEGATIVE Final    Comment: (NOTE) The Xpert Xpress SARS-CoV-2/FLU/RSV assay is intended as an aid in  the diagnosis of influenza from Nasopharyngeal swab specimens and  should not be used as a sole basis for treatment. Nasal washings and  aspirates are unacceptable for Xpert Xpress SARS-CoV-2/FLU/RSV  testing. Fact Sheet for Patients: PinkCheek.be Fact Sheet for Healthcare Providers: GravelBags.it This test is not yet approved or cleared by the [place] FDA and  has been authorized for detection and/or diagnosis of SARS-CoV-2 by  FDA under an Emergency Use Authorization (EUA). This EUA will remain  in effect (meaning this test can be used) for the duration of the  Covid-19 declaration under Section 564(b)(1) of the Act, 21  U.S.C. section 360bbb-3(b)(1), unless the authorization is  terminated or revoked. Performed at [facility], [place] [address], [place], [place] [address]      Labs: Basic Metabolic Panel: Recent Labs  Lab 04/03/19 1715 04/03/19 1715 04/03/19 2312 04/04/19 0817 04/04/19 0817 04/04/19 1353 04/05/19 0301 04/05/19 1144 04/06/19 0156 04/06/19 1225 04/07/19 0710  NA 133*  --   --   --   --  135  --  137  --  135 133*  K >7.5*   < > 4.2  --   --  6.0*  --  4.9  --  5.1 4.3  CL 90*  --   --   --   --  95*  --  99  --  97* 95*  CO2 17*  --   --   --   --   25  --  23  --  23 22  GLUCOSE 75  --   --   --   --  99  --  86  --  77 84  BUN 133*  --   --   --   --  57*  --  35*  --  65* 43*  CREATININE 16.69*  --   --   --   --  10.38*  --  7.47*  --  10.18* 8.05*  CALCIUM 8.1*  --   --   --   --  8.0*  --  8.0*  --  7.6* 7.7*  MG 2.9*  --   --  2.0  --   --  1.9  --  2.1  --  2.2  PHOS  --   --   --  7.7*   < > 7.9* 5.0*  --  5.3* 5.6* 5.5*   < > =  values in this interval not displayed.   Liver Function Tests: Recent Labs  Lab 04/03/19 1715 04/04/19 1353 04/06/19 1225 04/07/19 0710  AST 13*  --   --   --   ALT 15  --   --   --   ALKPHOS 154*  --   --   --   BILITOT 0.7  --   --   --   PROT 7.4  --   --   --   ALBUMIN 4.1 3.0* 3.0* 3.2*   No results for input(s): LIPASE, AMYLASE in the last 168 hours. No results for input(s): AMMONIA in the last 168 hours. CBC: Recent Labs  Lab 04/03/19 1715 04/04/19 0817 04/05/19 0301 04/06/19 0156  WBC 13.3* 8.5  8.5 7.4 6.6  NEUTROABS 11.2*  --   --   --   HGB 13.4 11.2*  11.3* 12.1 11.6*  HCT 41.1 34.5*  34.6* 37.8 35.4*  MCV 103.8* 102.7*  103.0* 104.7* 103.2*  PLT 286 189  181 176 134*   Cardiac Enzymes: No results for input(s): CKTOTAL, CKMB, CKMBINDEX, TROPONINI in the last 168 hours. BNP: BNP (last 3 results) No results for input(s): BNP in the last 8760 hours.  ProBNP (last 3 results) No results for input(s): PROBNP in the last 8760 hours.  CBG: No results for input(s): GLUCAP in the last 168 hours.     Signed:  Cristal Ford  [facility] 04/07/2019, 8:56 AM

## 2019-04-07 NOTE — Progress Notes (Signed)
Physical Therapy Treatment Patient Details Name: Mary Wilcox MRN: [id number] DOB: 10-02-1955 Today's Date: 04/07/2019    History of Present Illness Pt is a 64 y/o female admitted secondary to weakness and fall. Found to be hyperkalemic. CT of head and Cspine negative for acute abnormality. PMH includes ESRD on HD TTS, cervical cancer, PVD, and s/p L AV fistula placement.     PT Comments    Pt with improved total ambulation distance this session, requires seated rest breaks throughout session due to increased work of breathing and fatigue. Pt has rollator at home for rest breaks as needed. Pt navigated flight of steps with seated rest break after ascending flight, which pt states she often needs during or immediately after step navigation. Pt with tachycardia up to 124 bpm and RR up to 35 breaths/min, recovered with rest. PT encouraged pt to take frequent rest breaks with rollator (locked prior to seated rest) at home, and to have supervision and close guard with stair navigation. Pt eager to d/c home, plan is for d/c home today with HHPT.    Follow Up Recommendations  Home health PT     Equipment Recommendations  None recommended by PT;Other (comment)(SHE HAS ORDERED A RW.)    Recommendations for Other Services       Precautions / Restrictions Precautions Precautions: Fall Restrictions Weight Bearing Restrictions: No    Mobility  Bed Mobility Overal bed mobility: Needs Assistance Bed Mobility: Supine to Sit     Supine to sit: Modified independent (Device/Increase time)     General bed mobility comments: increased time and effort, no use of bedrails or assist of PT.  Transfers Overall transfer level: Needs assistance Equipment used: Rolling walker (2 wheeled) Transfers: Sit to/from Stand Sit to Stand: Supervision         General transfer comment: supervision for safety, verbal cuing for hand placement when rising x1.  Ambulation/Gait Ambulation/Gait assistance:  Supervision Gait Distance (Feet): 155 Feet(15+15+50+50+15+10) Assistive device: Rolling walker (2 wheeled) Gait Pattern/deviations: Step-through pattern;Decreased stride length;Trunk flexed Gait velocity: slightly decr   General Gait Details: supervision for safety, occasional close guard during turns. Verbal cuing for placement in RW x1.   Stairs Stairs: Yes Stairs assistance: Min guard Stair Management: One rail Left;Step to pattern;Sideways;Forwards Number of Stairs: 12 General stair comments: min guard for safety. Pt initially using bilateral rails but too wide, PT encouraged use of 1 rail and up sideways for bilateral UE support. Pt with seated rest at top of steps, tachycardia to 124 bpm and RR up to 35 breaths/min.   Wheelchair Mobility    Modified Rankin (Stroke Patients Only)       Balance Overall balance assessment: Needs assistance Sitting-balance support: No upper extremity supported;Feet supported Sitting balance-Leahy Scale: Good     Standing balance support: Bilateral upper extremity supported;No upper extremity supported;During functional activity Standing balance-Leahy Scale: Fair Standing balance comment: able to stand statically without UE support, reliant on RW during ambulation                            Cognition Arousal/Alertness: Awake/alert Behavior During Therapy: WFL for tasks assessed/performed Overall Cognitive Status: Within Functional Limits for tasks assessed                                        Exercises  General Comments        Pertinent Vitals/Pain Pain Assessment: Faces Faces Pain Scale: Hurts a little bit Pain Location: R foot, neuropathy Pain Descriptors / Indicators: Discomfort Pain Intervention(s): Limited activity within patient's tolerance;Monitored during session;Repositioned    Home Living                      Prior Function            PT Goals (current goals can now be  found in the care plan section) Acute Rehab PT Goals Patient Stated Goal: to get stronger and go home PT Goal Formulation: With patient Time For Goal Achievement: 04/18/19 Potential to Achieve Goals: Good Progress towards PT goals: Progressing toward goals    Frequency    Min 3X/week      PT Plan Current plan remains appropriate    Co-evaluation              AM-PAC PT "6 Clicks" Mobility   Outcome Measure  Help needed turning from your back to your side while in a flat bed without using bedrails?: None Help needed moving from lying on your back to sitting on the side of a flat bed without using bedrails?: None Help needed moving to and from a bed to a chair (including a wheelchair)?: None Help needed standing up from a chair using your arms (e.g., wheelchair or bedside chair)?: None Help needed to walk in hospital room?: None Help needed climbing 3-5 steps with a railing? : A Little 6 Click Score: 23    End of Session Equipment Utilized During Treatment: Gait belt Activity Tolerance: Patient tolerated treatment well;Patient limited by fatigue Patient left: with call bell/phone within reach;in chair(pt verbally agrees not to get up without PT assist) Nurse Communication: Mobility status PT Visit Diagnosis: Unsteadiness on feet (R26.81);Muscle weakness (generalized) (M62.81)     Time: 1173-5670 PT Time Calculation (min) (ACUTE ONLY): 34 min  Charges:  $Gait Training: 23-37 mins                     Shanel Prazak E, PT [place] Pager [phone]  Office [phone]  Randye Treichler D Lanore Renderos 04/07/2019, 10:15 AM

## 2019-04-08 ENCOUNTER — Telehealth: Payer: Self-pay | Admitting: Nurse Practitioner

## 2019-04-08 DIAGNOSIS — E039 Hypothyroidism, unspecified: Secondary | ICD-10-CM | POA: Diagnosis not present

## 2019-04-08 DIAGNOSIS — Z992 Dependence on renal dialysis: Secondary | ICD-10-CM | POA: Diagnosis not present

## 2019-04-08 DIAGNOSIS — D509 Iron deficiency anemia, unspecified: Secondary | ICD-10-CM | POA: Diagnosis not present

## 2019-04-08 DIAGNOSIS — N2581 Secondary hyperparathyroidism of renal origin: Secondary | ICD-10-CM | POA: Diagnosis not present

## 2019-04-08 DIAGNOSIS — N186 End stage renal disease: Secondary | ICD-10-CM | POA: Diagnosis not present

## 2019-04-08 NOTE — Telephone Encounter (Signed)
Transition of care contact from inpatient facility  Date of discharge: 04/07/2019 Date of contact: 04/08/2019 Method of contact: Phone  Attempted to contact patients to discuss transition of care from inpatient admission. Patient did not answer the phone. Message was left on the patient's voicemail with call back number [phone].

## 2019-04-09 ENCOUNTER — Telehealth: Payer: Self-pay | Admitting: Nurse Practitioner

## 2019-04-09 NOTE — Telephone Encounter (Signed)
Date of discharge: 04/07/2019 Date of contact: 04/09/2019 Method of contact: Phone Who did you talk to? Lajean Saver  Patient contacted to discuss transition of care from recent inpatient hospitalization. Patient was admitted to [facility] from 0/18 to 04/07/2019 with discharge diagnosis of hyperkalemia in setting of missed HD Medication changes were reviewed. Patient will follow up with his outpatient dialysis center on 04/08/2019 Other follow up needed includes F/U visit with PCP, needs to decide on [facility] agency and has walker. Marland Kitchen

## 2019-04-10 ENCOUNTER — Other Ambulatory Visit: Payer: Self-pay | Admitting: Family Medicine

## 2019-04-10 DIAGNOSIS — E2839 Other primary ovarian failure: Secondary | ICD-10-CM

## 2019-04-10 DIAGNOSIS — Z1231 Encounter for screening mammogram for malignant neoplasm of breast: Secondary | ICD-10-CM

## 2019-04-11 DIAGNOSIS — Z992 Dependence on renal dialysis: Secondary | ICD-10-CM | POA: Diagnosis not present

## 2019-04-11 DIAGNOSIS — N2581 Secondary hyperparathyroidism of renal origin: Secondary | ICD-10-CM | POA: Diagnosis not present

## 2019-04-11 DIAGNOSIS — N186 End stage renal disease: Secondary | ICD-10-CM | POA: Diagnosis not present

## 2019-04-11 DIAGNOSIS — D509 Iron deficiency anemia, unspecified: Secondary | ICD-10-CM | POA: Diagnosis not present

## 2019-04-13 DIAGNOSIS — D509 Iron deficiency anemia, unspecified: Secondary | ICD-10-CM | POA: Diagnosis not present

## 2019-04-13 DIAGNOSIS — N2581 Secondary hyperparathyroidism of renal origin: Secondary | ICD-10-CM | POA: Diagnosis not present

## 2019-04-13 DIAGNOSIS — Z992 Dependence on renal dialysis: Secondary | ICD-10-CM | POA: Diagnosis not present

## 2019-04-13 DIAGNOSIS — N186 End stage renal disease: Secondary | ICD-10-CM | POA: Diagnosis not present

## 2019-04-15 DIAGNOSIS — Z992 Dependence on renal dialysis: Secondary | ICD-10-CM | POA: Diagnosis not present

## 2019-04-15 DIAGNOSIS — N186 End stage renal disease: Secondary | ICD-10-CM | POA: Diagnosis not present

## 2019-04-15 DIAGNOSIS — D509 Iron deficiency anemia, unspecified: Secondary | ICD-10-CM | POA: Diagnosis not present

## 2019-04-15 DIAGNOSIS — N2581 Secondary hyperparathyroidism of renal origin: Secondary | ICD-10-CM | POA: Diagnosis not present

## 2019-04-17 DIAGNOSIS — N186 End stage renal disease: Secondary | ICD-10-CM | POA: Diagnosis not present

## 2019-04-17 DIAGNOSIS — N139 Obstructive and reflux uropathy, unspecified: Secondary | ICD-10-CM | POA: Diagnosis not present

## 2019-04-17 DIAGNOSIS — Z992 Dependence on renal dialysis: Secondary | ICD-10-CM | POA: Diagnosis not present

## 2019-04-18 DIAGNOSIS — Z992 Dependence on renal dialysis: Secondary | ICD-10-CM | POA: Diagnosis not present

## 2019-04-18 DIAGNOSIS — N186 End stage renal disease: Secondary | ICD-10-CM | POA: Diagnosis not present

## 2019-04-18 DIAGNOSIS — D509 Iron deficiency anemia, unspecified: Secondary | ICD-10-CM | POA: Diagnosis not present

## 2019-04-18 DIAGNOSIS — N2581 Secondary hyperparathyroidism of renal origin: Secondary | ICD-10-CM | POA: Diagnosis not present

## 2019-04-20 DIAGNOSIS — N186 End stage renal disease: Secondary | ICD-10-CM | POA: Diagnosis not present

## 2019-04-20 DIAGNOSIS — N2581 Secondary hyperparathyroidism of renal origin: Secondary | ICD-10-CM | POA: Diagnosis not present

## 2019-04-20 DIAGNOSIS — Z992 Dependence on renal dialysis: Secondary | ICD-10-CM | POA: Diagnosis not present

## 2019-04-20 DIAGNOSIS — D509 Iron deficiency anemia, unspecified: Secondary | ICD-10-CM | POA: Diagnosis not present

## 2019-04-22 DIAGNOSIS — N2581 Secondary hyperparathyroidism of renal origin: Secondary | ICD-10-CM | POA: Diagnosis not present

## 2019-04-22 DIAGNOSIS — D509 Iron deficiency anemia, unspecified: Secondary | ICD-10-CM | POA: Diagnosis not present

## 2019-04-22 DIAGNOSIS — N186 End stage renal disease: Secondary | ICD-10-CM | POA: Diagnosis not present

## 2019-04-22 DIAGNOSIS — Z992 Dependence on renal dialysis: Secondary | ICD-10-CM | POA: Diagnosis not present

## 2019-04-24 DIAGNOSIS — F411 Generalized anxiety disorder: Secondary | ICD-10-CM | POA: Diagnosis not present

## 2019-04-24 DIAGNOSIS — N186 End stage renal disease: Secondary | ICD-10-CM | POA: Diagnosis not present

## 2019-04-24 DIAGNOSIS — F5101 Primary insomnia: Secondary | ICD-10-CM | POA: Diagnosis not present

## 2019-04-24 DIAGNOSIS — Z09 Encounter for follow-up examination after completed treatment for conditions other than malignant neoplasm: Secondary | ICD-10-CM | POA: Diagnosis not present

## 2019-04-24 DIAGNOSIS — Z992 Dependence on renal dialysis: Secondary | ICD-10-CM | POA: Diagnosis not present

## 2019-04-24 DIAGNOSIS — F324 Major depressive disorder, single episode, in partial remission: Secondary | ICD-10-CM | POA: Diagnosis not present

## 2019-04-25 DIAGNOSIS — N186 End stage renal disease: Secondary | ICD-10-CM | POA: Diagnosis not present

## 2019-04-25 DIAGNOSIS — N2581 Secondary hyperparathyroidism of renal origin: Secondary | ICD-10-CM | POA: Diagnosis not present

## 2019-04-25 DIAGNOSIS — D509 Iron deficiency anemia, unspecified: Secondary | ICD-10-CM | POA: Diagnosis not present

## 2019-04-25 DIAGNOSIS — Z992 Dependence on renal dialysis: Secondary | ICD-10-CM | POA: Diagnosis not present

## 2019-04-27 ENCOUNTER — Other Ambulatory Visit: Payer: Self-pay

## 2019-04-27 ENCOUNTER — Telehealth (HOSPITAL_COMMUNITY): Payer: Self-pay

## 2019-04-27 DIAGNOSIS — N186 End stage renal disease: Secondary | ICD-10-CM | POA: Diagnosis not present

## 2019-04-27 DIAGNOSIS — Z992 Dependence on renal dialysis: Secondary | ICD-10-CM | POA: Diagnosis not present

## 2019-04-27 DIAGNOSIS — D509 Iron deficiency anemia, unspecified: Secondary | ICD-10-CM | POA: Diagnosis not present

## 2019-04-27 DIAGNOSIS — N2581 Secondary hyperparathyroidism of renal origin: Secondary | ICD-10-CM | POA: Diagnosis not present

## 2019-04-27 NOTE — Telephone Encounter (Signed)

## 2019-04-28 ENCOUNTER — Ambulatory Visit (INDEPENDENT_AMBULATORY_CARE_PROVIDER_SITE_OTHER): Payer: Medicare Other | Admitting: Vascular Surgery

## 2019-04-28 ENCOUNTER — Other Ambulatory Visit: Payer: Self-pay

## 2019-04-28 ENCOUNTER — Ambulatory Visit (HOSPITAL_COMMUNITY)
Admission: RE | Admit: 2019-04-28 | Discharge: 2019-04-28 | Disposition: A | Discharge status: Home / Self Care | Payer: Medicare Other | Source: Ambulatory Visit | Attending: Vascular Surgery | Admitting: Vascular Surgery

## 2019-04-28 ENCOUNTER — Ambulatory Visit (INDEPENDENT_AMBULATORY_CARE_PROVIDER_SITE_OTHER)
Admission: RE | Admit: 2019-04-28 | Discharge: 2019-04-28 | Disposition: A | Discharge status: Home / Self Care | Payer: Medicare Other | Source: Ambulatory Visit | Attending: Vascular Surgery | Admitting: Vascular Surgery

## 2019-04-28 ENCOUNTER — Encounter: Payer: Self-pay | Admitting: Vascular Surgery

## 2019-04-28 VITALS — BP 108/68 | HR 77 | Temp 97.9°F | Resp 20 | Ht 60.0 in | Wt 187.4 lb

## 2019-04-28 DIAGNOSIS — Z992 Dependence on renal dialysis: Secondary | ICD-10-CM

## 2019-04-28 DIAGNOSIS — N186 End stage renal disease: Secondary | ICD-10-CM | POA: Diagnosis not present

## 2019-04-28 NOTE — Progress Notes (Signed)
Patient ID: Mary Wilcox, female   DOB: 08-Feb-1956, 64 y.o.   MRN: [id number]  Reason for Consult: New Patient (Initial Visit)   Referred by Mary Loader, FNP  Subjective:     HPI:  Mary Wilcox is a 64 y.o. female presents for evaluation for new access.  Previously on dialysis via left arm graft.  Has been on dialysis Tuesdays, Thursdays, Saturdays via right IJ catheter for approximately 2 months now.  Has never had right upper extremity surgery or pacemaker or breast surgery on the right.  She is right-hand dominant.  Past Medical History:  Diagnosis Date  . Bilateral hydronephrosis 2006   due to obstruction from cervical cancer  . Cervical cancer ([place]) 2006   IIIB s/p ext radiation and intracavitary cessium  . Chronic kidney disease    hx several episodes of ARF secondary to obstructive uropathy  . Depression   . Insomnia    takes Ambien 3x wk  . Iron deficiency anemia   . Peripheral vascular disease ([place])   . Secondary hyperparathyroidism (of renal origin)   . Transfusion history    02/2011 for Hgb 6.8   Family History  Problem Relation Age of Onset  . Lung cancer Father   . Cancer Father   . Cancer Mother        kidney and thyroid  . Heart disease Brother   . Diabetes Brother   . Hyperlipidemia Brother   . Hypertension Brother   . Other Brother        varicose vein   Past Surgical History:  Procedure Laterality Date  . AV FISTULA PLACEMENT  03/05/2011   Procedure: INSERTION OF ARTERIOVENOUS (AV) GORE-TEX GRAFT ARM;  Surgeon: Angelia Mould, MD;  Location: [facility];  Service: Vascular;  Laterality: Left;  start 1115   finish 1250  . INSERTION OF DIALYSIS CATHETER  03/05/2011   Procedure: INSERTION OF DIALYSIS CATHETER;  Surgeon: Angelia Mould, MD;  Location: [facility];  Service: Vascular;  Laterality: Right;  start 1041- finish 1051  . TUBAL LIGATION    . URETER SURGERY     stent, Dr. Risa Grill for bilateral hydro    Short Social History:   Social History   Tobacco Use  . Smoking status: Never Smoker  . Smokeless tobacco: Never Used  Substance Use Topics  . Alcohol use: No    Allergies  Allergen Reactions  . Prozac [Fluoxetine Hcl] Hives    Current Outpatient Medications  Medication Sig Dispense Refill  . acetaminophen (TYLENOL) 500 MG tablet Take 1,000 mg by mouth every 6 (six) hours as needed for headache (pain).    . B Complex-C (SUPER B COMPLEX PO) Take 1 tablet by mouth daily.    . calcium acetate (PHOSLO) 667 MG capsule Take 667 mg by mouth See admin instructions. Take one capsule (667 mg) by mouth three times daily with meals and 2-3 times with snacks.    . cinacalcet (SENSIPAR) 90 MG tablet Take 90 mg by mouth at bedtime.    . ferric citrate (AURYXIA) 1 GM 210 MG(Fe) tablet Take 210-420 mg by mouth See admin instructions. Take two tablets (420 mg) by mouth three times daily with meals and one tablet (210 mg) up to 3 times daily with snacks    . gabapentin (NEURONTIN) 100 MG capsule Take 100 mg by mouth at bedtime. For leg pain    . levothyroxine (SYNTHROID) 25 MCG tablet Take 25 mcg by mouth daily before breakfast.    .  LOKELMA 10 g PACK packet SMARTSIG:1 Packet(s) By Mouth Twice a Week    . midodrine (PROAMATINE) 10 MG tablet Take 10 mg by mouth See admin instructions. Take one tablet (10 mg) by mouth twice on dialysis days (Tuesday, Thursday, Saturday) at 4am and 8am    . PROVENTIL HFA 108 (90 Base) MCG/ACT inhaler SMARTSIG:2 Puff(s) Via Inhaler As Needed    . sertraline (ZOLOFT) 100 MG tablet Take 100 mg by mouth daily.    . vitamin B-12 (CYANOCOBALAMIN) 1000 MCG tablet Take 1,000 mcg by mouth daily.    Marland Kitchen zolpidem (AMBIEN) 10 MG tablet Take 10 mg by mouth at bedtime as needed for sleep.      No current facility-administered medications for this visit.    Review of Systems  Constitutional:  Constitutional negative. HENT: HENT negative.  Eyes: Eyes negative.  Respiratory: Respiratory negative.   Cardiovascular: Cardiovascular negative.  GI: Gastrointestinal negative.  Musculoskeletal: Musculoskeletal negative.  Skin: Skin negative.  Neurological: Neurological negative. Hematologic: Hematologic/lymphatic negative.  Psychiatric: Psychiatric negative.        Objective:  Objective   Vitals:   04/28/19 0846  BP: 108/68  Pulse: 77  Resp: 20  Temp: 97.9 F (36.6 C)  SpO2: 95%  Weight: 187 lb 6.3 oz (85 kg)  Height: 5' (1.524 m)   Body mass index is 36.6 kg/m.  Physical Exam HENT:     Head: Normocephalic.  Eyes:     Pupils: Pupils are equal, round, and reactive to light.  Cardiovascular:     Rate and Rhythm: Normal rate.     Pulses:          Radial pulses are 2+ on the right side and 2+ on the left side.  Pulmonary:     Effort: Pulmonary effort is normal.  Abdominal:     General: Abdomen is flat.     Palpations: Abdomen is soft.  Musculoskeletal:        General: No swelling. Normal range of motion.  Skin:    General: Skin is warm.     Capillary Refill: Capillary refill takes less than 2 seconds.  Neurological:     General: No focal deficit present.     Mental Status: She is alert.  Psychiatric:        Mood and Affect: Mood normal.        Thought Content: Thought content normal.        Judgment: Judgment normal.     Data: I have independently interpreted her vein mapping bilaterally which demonstrates possible suitable basilic vein on the right no other suitable veins for dialysis access.  I have independently interpreted her bilateral upper extremity arterial duplex which demonstrates right-sided brachial artery 0.24 cm and triphasic on the left side 0.28 cm and triphasic     Assessment/Plan:     64 year old female on dialysis via right IJ catheter.  Previously has left upper extremity graft that is now failed.  I discussed with her proceeding with possible basilic vein fistula versus graft in the right upper extremity.  Basilic vein will require 2  surgeries she understands this.  Patient does not want surgery but is willing to proceed on a nondialysis day in the near future.  We discussed the risk benefits and alternatives and she demonstrates good understanding we will get her scheduled today.     Waynetta Sandy MD [facility]

## 2019-04-29 DIAGNOSIS — D509 Iron deficiency anemia, unspecified: Secondary | ICD-10-CM | POA: Diagnosis not present

## 2019-04-29 DIAGNOSIS — N186 End stage renal disease: Secondary | ICD-10-CM | POA: Diagnosis not present

## 2019-04-29 DIAGNOSIS — N2581 Secondary hyperparathyroidism of renal origin: Secondary | ICD-10-CM | POA: Diagnosis not present

## 2019-04-29 DIAGNOSIS — Z992 Dependence on renal dialysis: Secondary | ICD-10-CM | POA: Diagnosis not present

## 2019-05-02 DIAGNOSIS — Z992 Dependence on renal dialysis: Secondary | ICD-10-CM | POA: Diagnosis not present

## 2019-05-02 DIAGNOSIS — D509 Iron deficiency anemia, unspecified: Secondary | ICD-10-CM | POA: Diagnosis not present

## 2019-05-02 DIAGNOSIS — N186 End stage renal disease: Secondary | ICD-10-CM | POA: Diagnosis not present

## 2019-05-02 DIAGNOSIS — N2581 Secondary hyperparathyroidism of renal origin: Secondary | ICD-10-CM | POA: Diagnosis not present

## 2019-05-04 DIAGNOSIS — N2581 Secondary hyperparathyroidism of renal origin: Secondary | ICD-10-CM | POA: Diagnosis not present

## 2019-05-04 DIAGNOSIS — N186 End stage renal disease: Secondary | ICD-10-CM | POA: Diagnosis not present

## 2019-05-04 DIAGNOSIS — Z992 Dependence on renal dialysis: Secondary | ICD-10-CM | POA: Diagnosis not present

## 2019-05-04 DIAGNOSIS — D509 Iron deficiency anemia, unspecified: Secondary | ICD-10-CM | POA: Diagnosis not present

## 2019-05-06 DIAGNOSIS — D509 Iron deficiency anemia, unspecified: Secondary | ICD-10-CM | POA: Diagnosis not present

## 2019-05-06 DIAGNOSIS — Z992 Dependence on renal dialysis: Secondary | ICD-10-CM | POA: Diagnosis not present

## 2019-05-06 DIAGNOSIS — N186 End stage renal disease: Secondary | ICD-10-CM | POA: Diagnosis not present

## 2019-05-06 DIAGNOSIS — N2581 Secondary hyperparathyroidism of renal origin: Secondary | ICD-10-CM | POA: Diagnosis not present

## 2019-05-08 ENCOUNTER — Other Ambulatory Visit (HOSPITAL_COMMUNITY)
Admission: RE | Admit: 2019-05-08 | Discharge: 2019-05-08 | Disposition: A | Discharge status: Home / Self Care | Payer: Medicare Other | Source: Ambulatory Visit | Attending: Vascular Surgery | Admitting: Vascular Surgery

## 2019-05-08 DIAGNOSIS — Z20822 Contact with and (suspected) exposure to covid-19: Secondary | ICD-10-CM | POA: Insufficient documentation

## 2019-05-08 DIAGNOSIS — Z01812 Encounter for preprocedural laboratory examination: Secondary | ICD-10-CM | POA: Diagnosis not present

## 2019-05-08 LAB — SARS CORONAVIRUS 2 (TAT 6-24 HRS): SARS Coronavirus 2: NEGATIVE

## 2019-05-09 ENCOUNTER — Encounter (HOSPITAL_COMMUNITY): Payer: Self-pay | Admitting: Vascular Surgery

## 2019-05-09 ENCOUNTER — Other Ambulatory Visit: Payer: Self-pay

## 2019-05-09 DIAGNOSIS — D509 Iron deficiency anemia, unspecified: Secondary | ICD-10-CM | POA: Diagnosis not present

## 2019-05-09 DIAGNOSIS — Z992 Dependence on renal dialysis: Secondary | ICD-10-CM | POA: Diagnosis not present

## 2019-05-09 DIAGNOSIS — N186 End stage renal disease: Secondary | ICD-10-CM | POA: Diagnosis not present

## 2019-05-09 DIAGNOSIS — N2581 Secondary hyperparathyroidism of renal origin: Secondary | ICD-10-CM | POA: Diagnosis not present

## 2019-05-09 NOTE — Progress Notes (Signed)
Pt denies SOB, chest pain, and being under the care of a cardiologist. Pt tsated that PCP is Dr. Jenny Reichmann. Pt denies having a stress test, echo and cardiac cath. Pt made aware to stop taking  vitamins, fish oil and herbal medications. Do not take any NSAIDs ie: Ibuprofen, Advil, Naproxen (Aleve), Motrin, BC and Goody Powder. Pt reminded to quarantine. Pt verbalized understanding of all pre-op instructions.

## 2019-05-10 ENCOUNTER — Ambulatory Visit (HOSPITAL_COMMUNITY): Admission: RE | Admit: 2019-05-10 | Payer: Medicare Other | Source: Home / Self Care | Admitting: Vascular Surgery

## 2019-05-10 HISTORY — DX: Unspecified cataract: H26.9

## 2019-05-10 HISTORY — DX: Hypothyroidism, unspecified: E03.9

## 2019-05-10 HISTORY — DX: Presence of spectacles and contact lenses: Z97.3

## 2019-05-10 SURGERY — ARTERIOVENOUS (AV) FISTULA CREATION
Anesthesia: Monitor Anesthesia Care | Laterality: Right

## 2019-05-11 DIAGNOSIS — Z992 Dependence on renal dialysis: Secondary | ICD-10-CM | POA: Diagnosis not present

## 2019-05-11 DIAGNOSIS — N2581 Secondary hyperparathyroidism of renal origin: Secondary | ICD-10-CM | POA: Diagnosis not present

## 2019-05-11 DIAGNOSIS — D509 Iron deficiency anemia, unspecified: Secondary | ICD-10-CM | POA: Diagnosis not present

## 2019-05-11 DIAGNOSIS — N186 End stage renal disease: Secondary | ICD-10-CM | POA: Diagnosis not present

## 2019-05-13 DIAGNOSIS — D509 Iron deficiency anemia, unspecified: Secondary | ICD-10-CM | POA: Diagnosis not present

## 2019-05-13 DIAGNOSIS — N186 End stage renal disease: Secondary | ICD-10-CM | POA: Diagnosis not present

## 2019-05-13 DIAGNOSIS — Z992 Dependence on renal dialysis: Secondary | ICD-10-CM | POA: Diagnosis not present

## 2019-05-13 DIAGNOSIS — N2581 Secondary hyperparathyroidism of renal origin: Secondary | ICD-10-CM | POA: Diagnosis not present

## 2019-05-15 DIAGNOSIS — N186 End stage renal disease: Secondary | ICD-10-CM | POA: Diagnosis not present

## 2019-05-15 DIAGNOSIS — Z992 Dependence on renal dialysis: Secondary | ICD-10-CM | POA: Diagnosis not present

## 2019-05-15 DIAGNOSIS — N139 Obstructive and reflux uropathy, unspecified: Secondary | ICD-10-CM | POA: Diagnosis not present

## 2019-05-16 DIAGNOSIS — N2581 Secondary hyperparathyroidism of renal origin: Secondary | ICD-10-CM | POA: Diagnosis not present

## 2019-05-16 DIAGNOSIS — D689 Coagulation defect, unspecified: Secondary | ICD-10-CM | POA: Diagnosis not present

## 2019-05-16 DIAGNOSIS — E875 Hyperkalemia: Secondary | ICD-10-CM | POA: Diagnosis not present

## 2019-05-16 DIAGNOSIS — Z992 Dependence on renal dialysis: Secondary | ICD-10-CM | POA: Diagnosis not present

## 2019-05-16 DIAGNOSIS — N186 End stage renal disease: Secondary | ICD-10-CM | POA: Diagnosis not present

## 2019-05-18 DIAGNOSIS — Z992 Dependence on renal dialysis: Secondary | ICD-10-CM | POA: Diagnosis not present

## 2019-05-18 DIAGNOSIS — N2581 Secondary hyperparathyroidism of renal origin: Secondary | ICD-10-CM | POA: Diagnosis not present

## 2019-05-18 DIAGNOSIS — D689 Coagulation defect, unspecified: Secondary | ICD-10-CM | POA: Diagnosis not present

## 2019-05-18 DIAGNOSIS — E875 Hyperkalemia: Secondary | ICD-10-CM | POA: Diagnosis not present

## 2019-05-18 DIAGNOSIS — N186 End stage renal disease: Secondary | ICD-10-CM | POA: Diagnosis not present

## 2019-05-19 ENCOUNTER — Other Ambulatory Visit: Payer: Self-pay

## 2019-05-19 NOTE — Progress Notes (Signed)
Went to add orders in for patients surgery but they are still active in the chart.   Mary Wilcox Mindi Junker

## 2019-05-20 DIAGNOSIS — E875 Hyperkalemia: Secondary | ICD-10-CM | POA: Diagnosis not present

## 2019-05-20 DIAGNOSIS — N2581 Secondary hyperparathyroidism of renal origin: Secondary | ICD-10-CM | POA: Diagnosis not present

## 2019-05-20 DIAGNOSIS — Z992 Dependence on renal dialysis: Secondary | ICD-10-CM | POA: Diagnosis not present

## 2019-05-20 DIAGNOSIS — N186 End stage renal disease: Secondary | ICD-10-CM | POA: Diagnosis not present

## 2019-05-20 DIAGNOSIS — D689 Coagulation defect, unspecified: Secondary | ICD-10-CM | POA: Diagnosis not present

## 2019-05-23 ENCOUNTER — Other Ambulatory Visit (HOSPITAL_COMMUNITY): Payer: Self-pay | Admitting: Nephrology

## 2019-05-23 ENCOUNTER — Other Ambulatory Visit: Payer: Self-pay | Admitting: Radiology

## 2019-05-23 DIAGNOSIS — D689 Coagulation defect, unspecified: Secondary | ICD-10-CM | POA: Diagnosis not present

## 2019-05-23 DIAGNOSIS — Z992 Dependence on renal dialysis: Secondary | ICD-10-CM | POA: Diagnosis not present

## 2019-05-23 DIAGNOSIS — N2581 Secondary hyperparathyroidism of renal origin: Secondary | ICD-10-CM | POA: Diagnosis not present

## 2019-05-23 DIAGNOSIS — N186 End stage renal disease: Secondary | ICD-10-CM

## 2019-05-23 DIAGNOSIS — E875 Hyperkalemia: Secondary | ICD-10-CM | POA: Diagnosis not present

## 2019-05-24 ENCOUNTER — Other Ambulatory Visit: Payer: Self-pay

## 2019-05-24 ENCOUNTER — Ambulatory Visit (HOSPITAL_COMMUNITY)
Admission: RE | Admit: 2019-05-24 | Discharge: 2019-05-24 | Disposition: A | Discharge status: Home / Self Care | Payer: Medicare Other | Source: Ambulatory Visit | Attending: Nephrology | Admitting: Nephrology

## 2019-05-24 DIAGNOSIS — Z4901 Encounter for fitting and adjustment of extracorporeal dialysis catheter: Secondary | ICD-10-CM | POA: Insufficient documentation

## 2019-05-24 DIAGNOSIS — N186 End stage renal disease: Secondary | ICD-10-CM | POA: Diagnosis not present

## 2019-05-24 DIAGNOSIS — T8249XA Other complication of vascular dialysis catheter, initial encounter: Secondary | ICD-10-CM | POA: Diagnosis not present

## 2019-05-24 HISTORY — PX: IR FLUORO GUIDE CV LINE RIGHT: IMG2283

## 2019-05-24 IMAGING — XA IR FLUORO GUIDE CV LINE*R*
1 series · 1 of 1 positions shown · non-contrast
Comparison: none

INDICATION: 63-year-old with end-stage renal disease and poorly functioning
dialysis catheter. Patient presents for dialysis catheter exchange.

[Series 2: fl (-) angio · 1 of 1 slices shown]
[im 1/1]
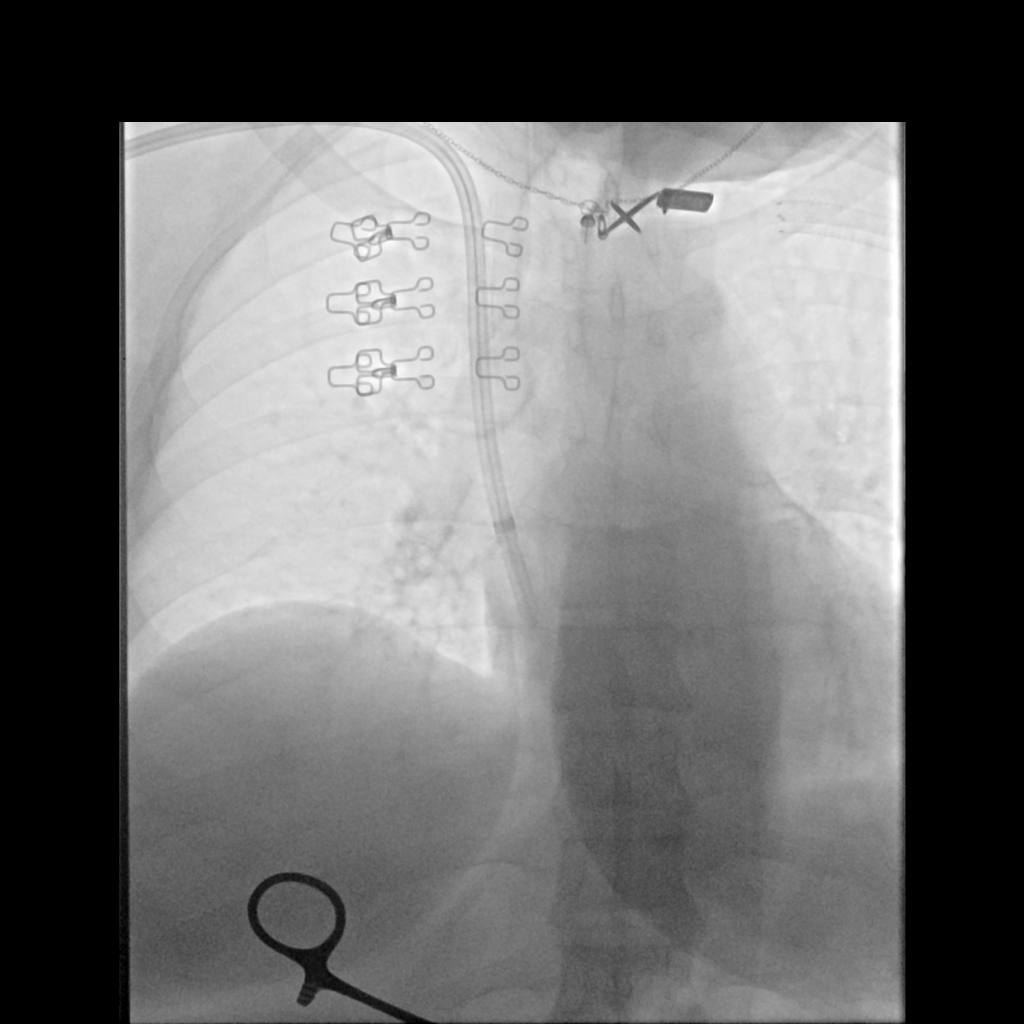

[1 of 1 positions shown; findings below may reference images not displayed]

EXAM:
EXCHANGE OF TUNNELED DIALYSIS CATHETER WITH FLUOROSCOPY

MEDICATIONS:
Ancef 2 g; The antibiotic was administered within an appropriate
time interval prior to skin puncture.

ANESTHESIA/SEDATION:
None

FLUOROSCOPY TIME:  Fluoroscopy Time: 18 seconds, 2 mGy

COMPLICATIONS:
None immediate.

PROCEDURE:
The procedure was explained to the patient. The risks and benefits
of the procedure were discussed and the patient's questions were
addressed. Informed consent was obtained from the patient. The
patient was placed supine on the interventional table. Right neck
and existing catheter were prepped and draped in sterile fashion.
Maximal barrier sterile technique was utilized including caps, mask,
sterile gowns, sterile gloves, sterile drape, hand hygiene and skin
antiseptic. Skin was anesthetized around the catheter skin site with
1% lidocaine. The catheter cuff was exposed with dissection.
Catheter was removed over a stiff Glidewire. A new 19 cm tip to cuff
Palindrome catheter was advanced over the wire. Catheter tip
positioned at the SVC and right atrium junction. Both lumens
aspirated and flushed well. Appropriate amount of heparin was placed
in both lumens. Catheter was sutured to skin. Dressing was placed
over the catheter.

Fluoroscopic images were taken and saved for this procedure.
FINDINGS: Catheter tip at the SVC and right atrium junction.
IMPRESSION: Successful exchange of the tunneled dialysis catheter with
fluoroscopy.

## 2019-05-24 MED ORDER — LIDOCAINE HCL 1 % IJ SOLN
INTRAMUSCULAR | Status: DC | PRN
  Administered 2019-05-24: 5 mL

## 2019-05-24 MED ORDER — SODIUM CHLORIDE 0.9 % IV SOLN: INTRAVENOUS | Status: DC

## 2019-05-24 MED ORDER — CHLORHEXIDINE GLUCONATE 4 % EX LIQD
CUTANEOUS | Status: DC | PRN
  Administered 2019-05-24: 1 via TOPICAL

## 2019-05-24 MED ORDER — CEFAZOLIN SODIUM-DEXTROSE 2-4 GM/100ML-% IV SOLN
INTRAVENOUS | Status: AC
  Filled 2019-05-24: qty 100

## 2019-05-24 MED ORDER — CEFAZOLIN SODIUM-DEXTROSE 2-4 GM/100ML-% IV SOLN: 2.0000 g | INTRAVENOUS | Status: DC

## 2019-05-24 MED ORDER — LIDOCAINE HCL 1 % IJ SOLN
INTRAMUSCULAR | Status: AC
  Filled 2019-05-24: qty 20

## 2019-05-24 MED ORDER — HEPARIN SODIUM (PORCINE) 1000 UNIT/ML IJ SOLN
INTRAMUSCULAR | Status: DC | PRN
  Administered 2019-05-24: 3200 [IU] via INTRAVENOUS

## 2019-05-24 MED ORDER — CHLORHEXIDINE GLUCONATE 4 % EX LIQD
CUTANEOUS | Status: AC
  Filled 2019-05-24: qty 15

## 2019-05-24 MED ORDER — HEPARIN SODIUM (PORCINE) 1000 UNIT/ML IJ SOLN
INTRAMUSCULAR | Status: AC
  Administered 2019-05-24: 3.2 mL
  Filled 2019-05-24: qty 1

## 2019-05-24 NOTE — Procedures (Signed)
Interventional Radiology Procedure:   Indications: ESRD and poorly functioning catheter  Procedure: Tunneled dialysis catheter exchange  Findings: New 19 cm Palindrome at SVC/RA junction  Complications: None     EBL: less than 20 ml  Plan: Catheter is ready to use.    Arhianna Ebey R. Anselm Pancoast, MD  Pager: [phone]

## 2019-05-25 DIAGNOSIS — Z992 Dependence on renal dialysis: Secondary | ICD-10-CM | POA: Diagnosis not present

## 2019-05-25 DIAGNOSIS — E875 Hyperkalemia: Secondary | ICD-10-CM | POA: Diagnosis not present

## 2019-05-25 DIAGNOSIS — N186 End stage renal disease: Secondary | ICD-10-CM | POA: Diagnosis not present

## 2019-05-25 DIAGNOSIS — N2581 Secondary hyperparathyroidism of renal origin: Secondary | ICD-10-CM | POA: Diagnosis not present

## 2019-05-25 DIAGNOSIS — D689 Coagulation defect, unspecified: Secondary | ICD-10-CM | POA: Diagnosis not present

## 2019-05-27 DIAGNOSIS — N186 End stage renal disease: Secondary | ICD-10-CM | POA: Diagnosis not present

## 2019-05-27 DIAGNOSIS — N2581 Secondary hyperparathyroidism of renal origin: Secondary | ICD-10-CM | POA: Diagnosis not present

## 2019-05-27 DIAGNOSIS — E875 Hyperkalemia: Secondary | ICD-10-CM | POA: Diagnosis not present

## 2019-05-27 DIAGNOSIS — Z992 Dependence on renal dialysis: Secondary | ICD-10-CM | POA: Diagnosis not present

## 2019-05-27 DIAGNOSIS — D689 Coagulation defect, unspecified: Secondary | ICD-10-CM | POA: Diagnosis not present

## 2019-05-30 DIAGNOSIS — N186 End stage renal disease: Secondary | ICD-10-CM | POA: Diagnosis not present

## 2019-05-30 DIAGNOSIS — Z992 Dependence on renal dialysis: Secondary | ICD-10-CM | POA: Diagnosis not present

## 2019-05-30 DIAGNOSIS — D689 Coagulation defect, unspecified: Secondary | ICD-10-CM | POA: Diagnosis not present

## 2019-05-30 DIAGNOSIS — E875 Hyperkalemia: Secondary | ICD-10-CM | POA: Diagnosis not present

## 2019-05-30 DIAGNOSIS — N2581 Secondary hyperparathyroidism of renal origin: Secondary | ICD-10-CM | POA: Diagnosis not present

## 2019-06-03 DIAGNOSIS — Z992 Dependence on renal dialysis: Secondary | ICD-10-CM | POA: Diagnosis not present

## 2019-06-03 DIAGNOSIS — E875 Hyperkalemia: Secondary | ICD-10-CM | POA: Diagnosis not present

## 2019-06-03 DIAGNOSIS — D689 Coagulation defect, unspecified: Secondary | ICD-10-CM | POA: Diagnosis not present

## 2019-06-03 DIAGNOSIS — N186 End stage renal disease: Secondary | ICD-10-CM | POA: Diagnosis not present

## 2019-06-03 DIAGNOSIS — N2581 Secondary hyperparathyroidism of renal origin: Secondary | ICD-10-CM | POA: Diagnosis not present

## 2019-06-05 ENCOUNTER — Ambulatory Visit
Admission: RE | Admit: 2019-06-05 | Discharge: 2019-06-05 | Disposition: A | Discharge status: Home / Self Care | Payer: Medicare Other | Source: Ambulatory Visit | Attending: Family Medicine | Admitting: Family Medicine

## 2019-06-05 ENCOUNTER — Other Ambulatory Visit: Payer: Self-pay

## 2019-06-05 DIAGNOSIS — E2839 Other primary ovarian failure: Secondary | ICD-10-CM

## 2019-06-05 DIAGNOSIS — M85852 Other specified disorders of bone density and structure, left thigh: Secondary | ICD-10-CM | POA: Diagnosis not present

## 2019-06-05 DIAGNOSIS — Z1231 Encounter for screening mammogram for malignant neoplasm of breast: Secondary | ICD-10-CM | POA: Diagnosis not present

## 2019-06-05 DIAGNOSIS — E875 Hyperkalemia: Secondary | ICD-10-CM | POA: Diagnosis not present

## 2019-06-05 DIAGNOSIS — M810 Age-related osteoporosis without current pathological fracture: Secondary | ICD-10-CM | POA: Diagnosis not present

## 2019-06-05 DIAGNOSIS — N186 End stage renal disease: Secondary | ICD-10-CM | POA: Diagnosis not present

## 2019-06-05 DIAGNOSIS — D689 Coagulation defect, unspecified: Secondary | ICD-10-CM | POA: Diagnosis not present

## 2019-06-05 DIAGNOSIS — Z992 Dependence on renal dialysis: Secondary | ICD-10-CM | POA: Diagnosis not present

## 2019-06-05 DIAGNOSIS — N2581 Secondary hyperparathyroidism of renal origin: Secondary | ICD-10-CM | POA: Diagnosis not present

## 2019-06-05 DIAGNOSIS — Z780 Asymptomatic menopausal state: Secondary | ICD-10-CM | POA: Diagnosis not present

## 2019-06-05 IMAGING — MG DIGITAL SCREENING BILAT W/ TOMO W/ CAD
8 series · 8 of 24 positions shown · non-contrast
Comparison: None.

ACR Breast Density Category a: The breast tissue is almost entirely
fatty.

CLINICAL DATA: Screening. This is the patient's initial baseline
mammogram.

EXAM:
DIGITAL SCREENING BILATERAL MAMMOGRAM WITH TOMO AND CAD

[R CC synth-2D]
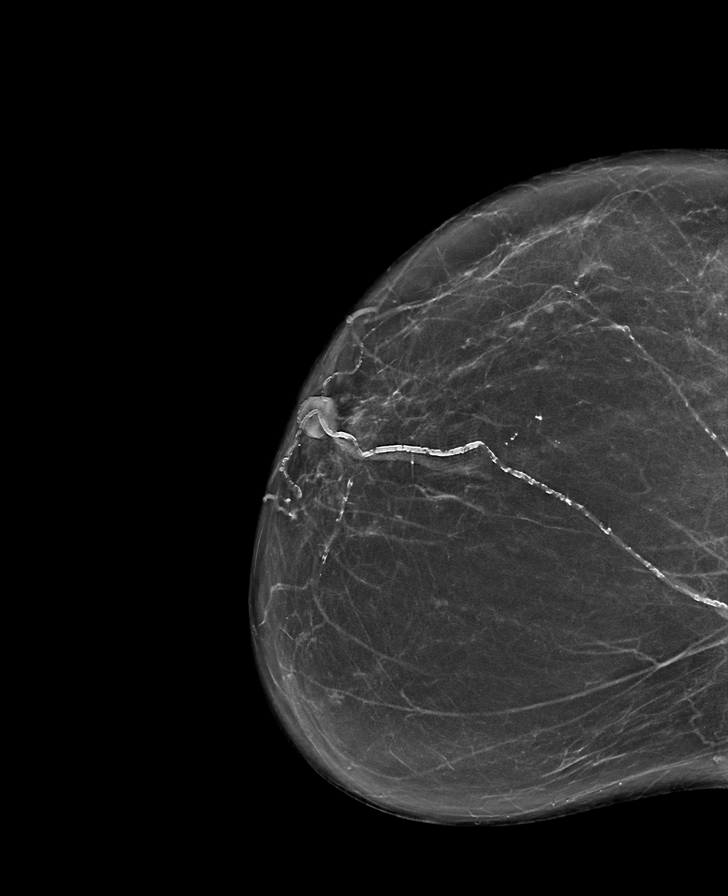

[L MLO synth-2D]
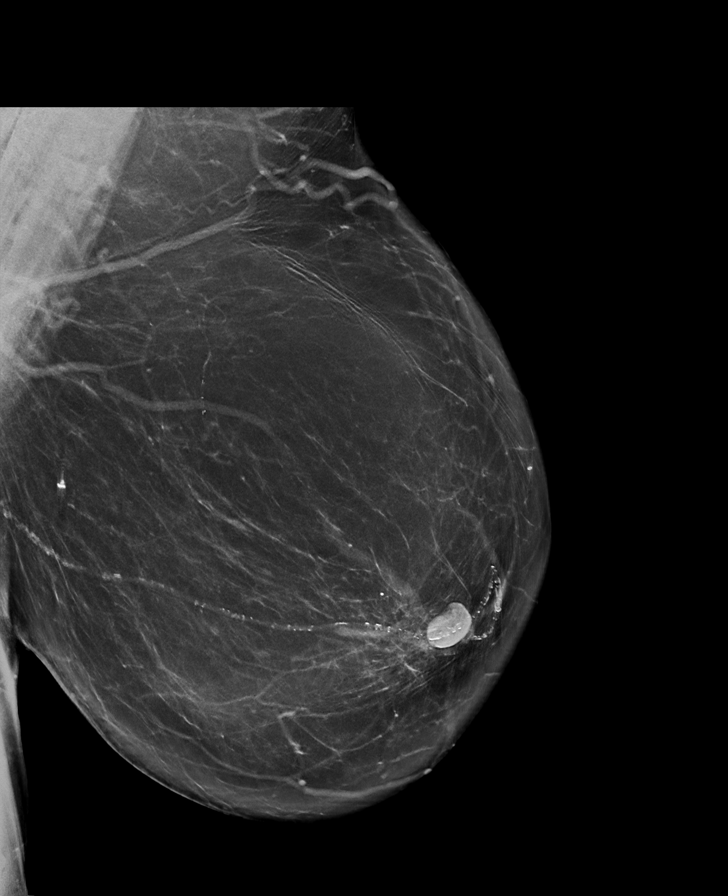

[R MLO synth-2D]
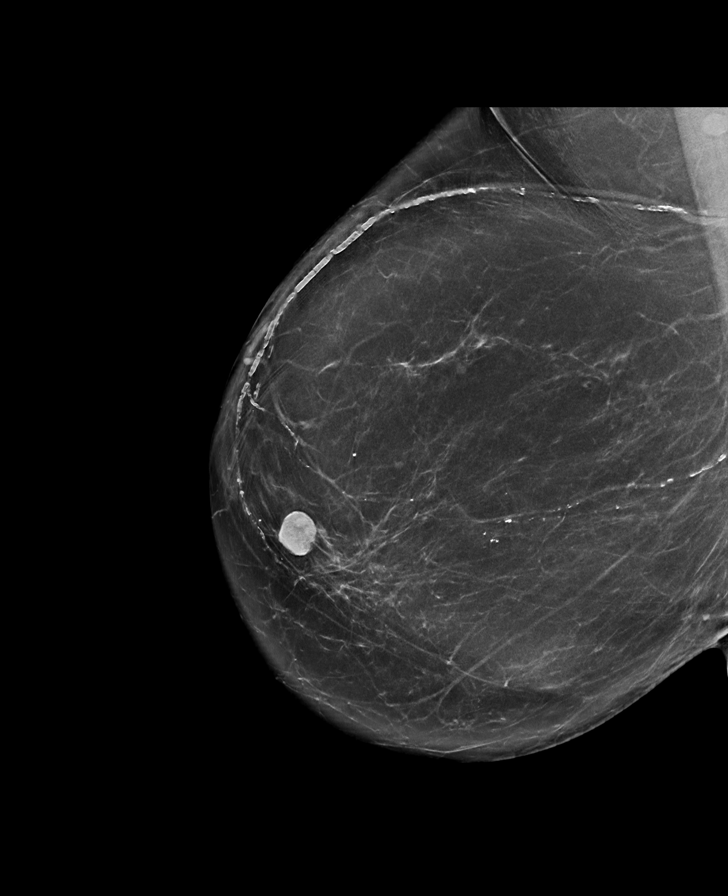

[L CC synth-2D]
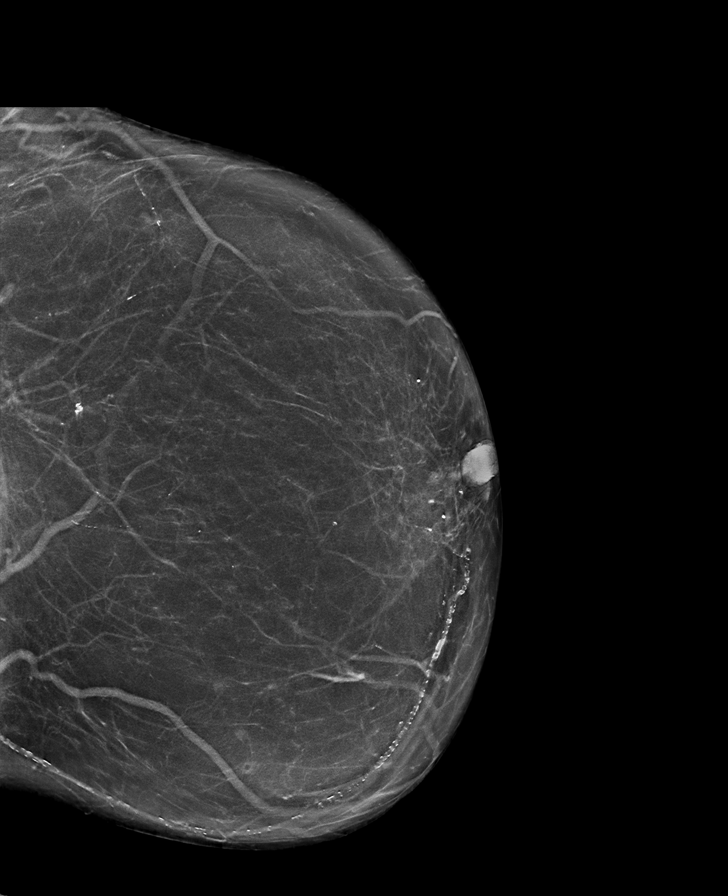

[L CC tomo · tomo slice 37/74.0]
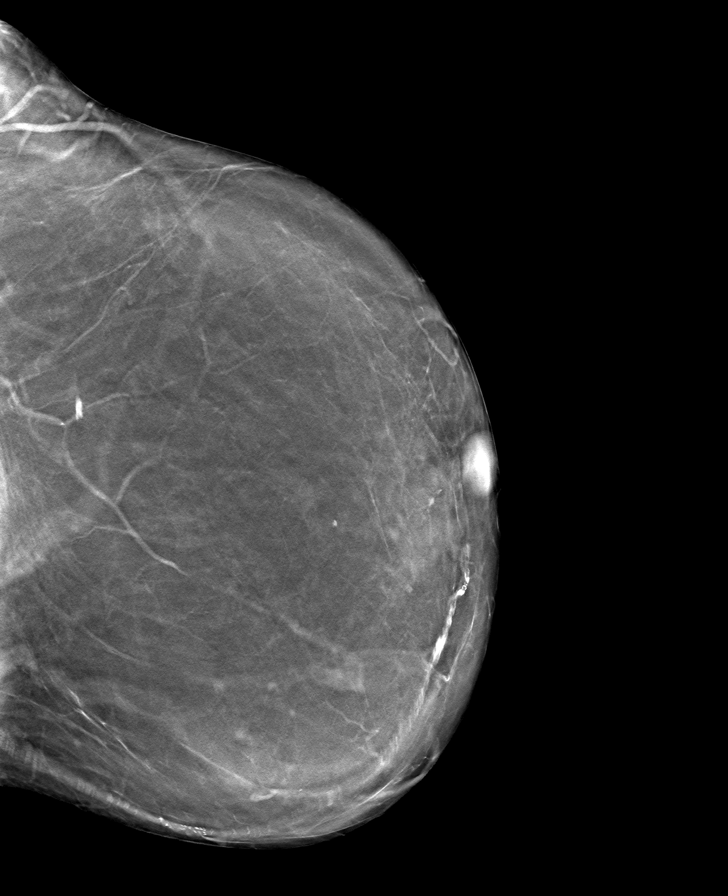

[R CC tomo · tomo slice 37/72.0]
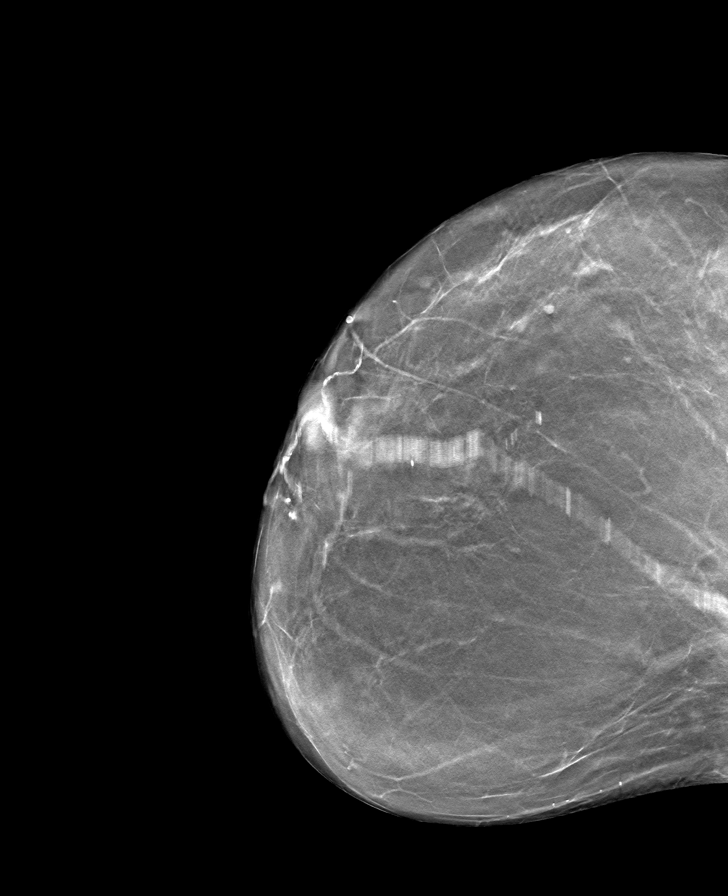

[L MLO tomo · tomo slice 45/89.0]
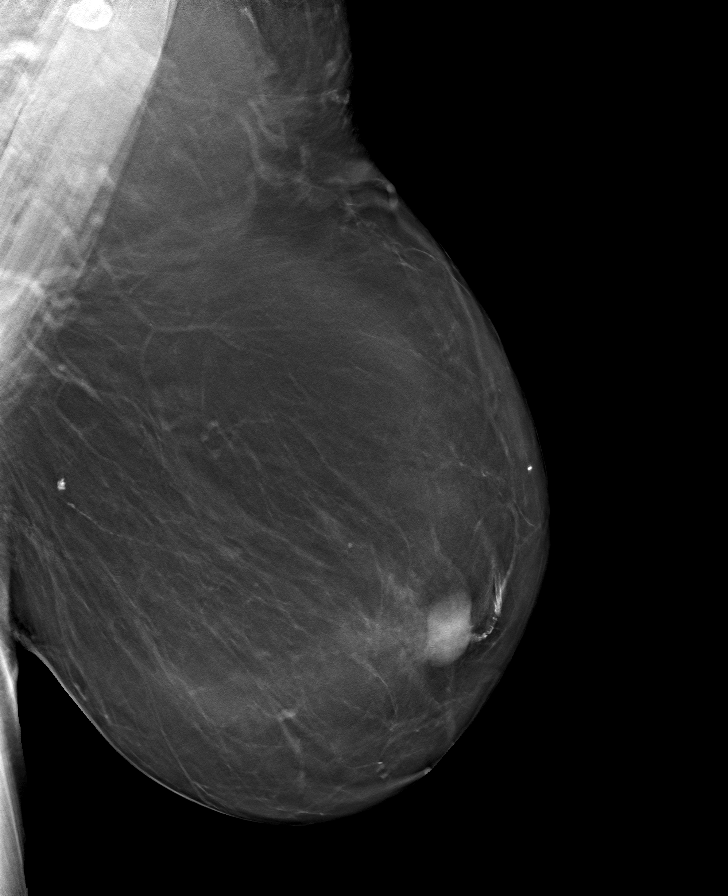

[R MLO tomo · tomo slice 41/82.0]
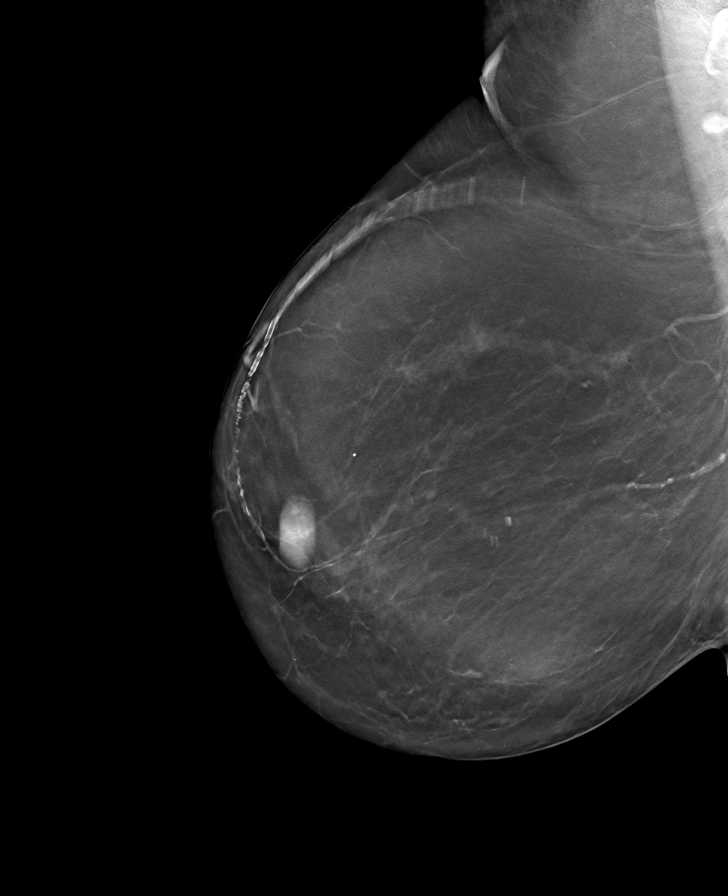

[8 of 24 positions shown; findings below may reference images not displayed]

FINDINGS: There are no findings suspicious for malignancy. Images were
processed with CAD.
IMPRESSION: No mammographic evidence of malignancy. A result letter of this
screening mammogram will be mailed directly to the patient.

RECOMMENDATION:
Screening mammogram in one year. (Code:[RK])

BI-RADS CATEGORY  1: Negative.

## 2019-06-06 DIAGNOSIS — Z992 Dependence on renal dialysis: Secondary | ICD-10-CM | POA: Diagnosis not present

## 2019-06-06 DIAGNOSIS — N186 End stage renal disease: Secondary | ICD-10-CM | POA: Diagnosis not present

## 2019-06-06 DIAGNOSIS — N2581 Secondary hyperparathyroidism of renal origin: Secondary | ICD-10-CM | POA: Diagnosis not present

## 2019-06-06 DIAGNOSIS — E875 Hyperkalemia: Secondary | ICD-10-CM | POA: Diagnosis not present

## 2019-06-06 DIAGNOSIS — D689 Coagulation defect, unspecified: Secondary | ICD-10-CM | POA: Diagnosis not present

## 2019-06-08 DIAGNOSIS — Z992 Dependence on renal dialysis: Secondary | ICD-10-CM | POA: Diagnosis not present

## 2019-06-08 DIAGNOSIS — N2581 Secondary hyperparathyroidism of renal origin: Secondary | ICD-10-CM | POA: Diagnosis not present

## 2019-06-08 DIAGNOSIS — E875 Hyperkalemia: Secondary | ICD-10-CM | POA: Diagnosis not present

## 2019-06-08 DIAGNOSIS — D689 Coagulation defect, unspecified: Secondary | ICD-10-CM | POA: Diagnosis not present

## 2019-06-08 DIAGNOSIS — N186 End stage renal disease: Secondary | ICD-10-CM | POA: Diagnosis not present

## 2019-06-10 DIAGNOSIS — D689 Coagulation defect, unspecified: Secondary | ICD-10-CM | POA: Diagnosis not present

## 2019-06-10 DIAGNOSIS — E875 Hyperkalemia: Secondary | ICD-10-CM | POA: Diagnosis not present

## 2019-06-10 DIAGNOSIS — Z992 Dependence on renal dialysis: Secondary | ICD-10-CM | POA: Diagnosis not present

## 2019-06-10 DIAGNOSIS — N186 End stage renal disease: Secondary | ICD-10-CM | POA: Diagnosis not present

## 2019-06-10 DIAGNOSIS — N2581 Secondary hyperparathyroidism of renal origin: Secondary | ICD-10-CM | POA: Diagnosis not present

## 2019-06-13 DIAGNOSIS — N2581 Secondary hyperparathyroidism of renal origin: Secondary | ICD-10-CM | POA: Diagnosis not present

## 2019-06-13 DIAGNOSIS — N186 End stage renal disease: Secondary | ICD-10-CM | POA: Diagnosis not present

## 2019-06-13 DIAGNOSIS — Z992 Dependence on renal dialysis: Secondary | ICD-10-CM | POA: Diagnosis not present

## 2019-06-13 DIAGNOSIS — D689 Coagulation defect, unspecified: Secondary | ICD-10-CM | POA: Diagnosis not present

## 2019-06-13 DIAGNOSIS — E875 Hyperkalemia: Secondary | ICD-10-CM | POA: Diagnosis not present

## 2019-06-15 DIAGNOSIS — N139 Obstructive and reflux uropathy, unspecified: Secondary | ICD-10-CM | POA: Diagnosis not present

## 2019-06-15 DIAGNOSIS — Z992 Dependence on renal dialysis: Secondary | ICD-10-CM | POA: Diagnosis not present

## 2019-06-15 DIAGNOSIS — D689 Coagulation defect, unspecified: Secondary | ICD-10-CM | POA: Diagnosis not present

## 2019-06-15 DIAGNOSIS — E875 Hyperkalemia: Secondary | ICD-10-CM | POA: Diagnosis not present

## 2019-06-15 DIAGNOSIS — Z23 Encounter for immunization: Secondary | ICD-10-CM | POA: Diagnosis not present

## 2019-06-15 DIAGNOSIS — N2581 Secondary hyperparathyroidism of renal origin: Secondary | ICD-10-CM | POA: Diagnosis not present

## 2019-06-15 DIAGNOSIS — N186 End stage renal disease: Secondary | ICD-10-CM | POA: Diagnosis not present

## 2019-06-20 DIAGNOSIS — N186 End stage renal disease: Secondary | ICD-10-CM | POA: Diagnosis not present

## 2019-06-20 DIAGNOSIS — D689 Coagulation defect, unspecified: Secondary | ICD-10-CM | POA: Diagnosis not present

## 2019-06-20 DIAGNOSIS — Z23 Encounter for immunization: Secondary | ICD-10-CM | POA: Diagnosis not present

## 2019-06-20 DIAGNOSIS — Z992 Dependence on renal dialysis: Secondary | ICD-10-CM | POA: Diagnosis not present

## 2019-06-20 DIAGNOSIS — E875 Hyperkalemia: Secondary | ICD-10-CM | POA: Diagnosis not present

## 2019-06-20 DIAGNOSIS — N2581 Secondary hyperparathyroidism of renal origin: Secondary | ICD-10-CM | POA: Diagnosis not present

## 2019-06-22 DIAGNOSIS — N186 End stage renal disease: Secondary | ICD-10-CM | POA: Diagnosis not present

## 2019-06-22 DIAGNOSIS — E875 Hyperkalemia: Secondary | ICD-10-CM | POA: Diagnosis not present

## 2019-06-22 DIAGNOSIS — Z992 Dependence on renal dialysis: Secondary | ICD-10-CM | POA: Diagnosis not present

## 2019-06-22 DIAGNOSIS — D689 Coagulation defect, unspecified: Secondary | ICD-10-CM | POA: Diagnosis not present

## 2019-06-22 DIAGNOSIS — Z23 Encounter for immunization: Secondary | ICD-10-CM | POA: Diagnosis not present

## 2019-06-22 DIAGNOSIS — N2581 Secondary hyperparathyroidism of renal origin: Secondary | ICD-10-CM | POA: Diagnosis not present

## 2019-06-24 DIAGNOSIS — D689 Coagulation defect, unspecified: Secondary | ICD-10-CM | POA: Diagnosis not present

## 2019-06-24 DIAGNOSIS — Z23 Encounter for immunization: Secondary | ICD-10-CM | POA: Diagnosis not present

## 2019-06-24 DIAGNOSIS — Z992 Dependence on renal dialysis: Secondary | ICD-10-CM | POA: Diagnosis not present

## 2019-06-24 DIAGNOSIS — E875 Hyperkalemia: Secondary | ICD-10-CM | POA: Diagnosis not present

## 2019-06-24 DIAGNOSIS — N2581 Secondary hyperparathyroidism of renal origin: Secondary | ICD-10-CM | POA: Diagnosis not present

## 2019-06-24 DIAGNOSIS — N186 End stage renal disease: Secondary | ICD-10-CM | POA: Diagnosis not present

## 2019-06-27 DIAGNOSIS — E875 Hyperkalemia: Secondary | ICD-10-CM | POA: Diagnosis not present

## 2019-06-27 DIAGNOSIS — N2581 Secondary hyperparathyroidism of renal origin: Secondary | ICD-10-CM | POA: Diagnosis not present

## 2019-06-27 DIAGNOSIS — Z23 Encounter for immunization: Secondary | ICD-10-CM | POA: Diagnosis not present

## 2019-06-27 DIAGNOSIS — N186 End stage renal disease: Secondary | ICD-10-CM | POA: Diagnosis not present

## 2019-06-27 DIAGNOSIS — D689 Coagulation defect, unspecified: Secondary | ICD-10-CM | POA: Diagnosis not present

## 2019-06-27 DIAGNOSIS — Z992 Dependence on renal dialysis: Secondary | ICD-10-CM | POA: Diagnosis not present

## 2019-06-27 NOTE — Progress Notes (Addendum)
  Ms Mary Wilcox called the scheduler to tell her that  She is cancelling tomorrow's surgery, [name] instructed patient to call her surgeon's office. I called Mary Wilcox at Dr. Claretha Cooper office yo make sure patient had notified them.

## 2019-06-28 ENCOUNTER — Ambulatory Visit (HOSPITAL_COMMUNITY): Admission: RE | Admit: 2019-06-28 | Payer: Medicare Other | Source: Home / Self Care | Admitting: Vascular Surgery

## 2019-06-28 SURGERY — ARTERIOVENOUS (AV) FISTULA CREATION
Anesthesia: Monitor Anesthesia Care | Laterality: Right

## 2019-06-29 DIAGNOSIS — Z992 Dependence on renal dialysis: Secondary | ICD-10-CM | POA: Diagnosis not present

## 2019-06-29 DIAGNOSIS — N186 End stage renal disease: Secondary | ICD-10-CM | POA: Diagnosis not present

## 2019-06-29 DIAGNOSIS — D689 Coagulation defect, unspecified: Secondary | ICD-10-CM | POA: Diagnosis not present

## 2019-06-29 DIAGNOSIS — E875 Hyperkalemia: Secondary | ICD-10-CM | POA: Diagnosis not present

## 2019-06-29 DIAGNOSIS — N2581 Secondary hyperparathyroidism of renal origin: Secondary | ICD-10-CM | POA: Diagnosis not present

## 2019-06-29 DIAGNOSIS — Z23 Encounter for immunization: Secondary | ICD-10-CM | POA: Diagnosis not present

## 2019-07-01 DIAGNOSIS — N186 End stage renal disease: Secondary | ICD-10-CM | POA: Diagnosis not present

## 2019-07-01 DIAGNOSIS — Z23 Encounter for immunization: Secondary | ICD-10-CM | POA: Diagnosis not present

## 2019-07-01 DIAGNOSIS — Z992 Dependence on renal dialysis: Secondary | ICD-10-CM | POA: Diagnosis not present

## 2019-07-01 DIAGNOSIS — N2581 Secondary hyperparathyroidism of renal origin: Secondary | ICD-10-CM | POA: Diagnosis not present

## 2019-07-01 DIAGNOSIS — D689 Coagulation defect, unspecified: Secondary | ICD-10-CM | POA: Diagnosis not present

## 2019-07-01 DIAGNOSIS — E875 Hyperkalemia: Secondary | ICD-10-CM | POA: Diagnosis not present

## 2019-07-04 DIAGNOSIS — D689 Coagulation defect, unspecified: Secondary | ICD-10-CM | POA: Diagnosis not present

## 2019-07-04 DIAGNOSIS — Z992 Dependence on renal dialysis: Secondary | ICD-10-CM | POA: Diagnosis not present

## 2019-07-04 DIAGNOSIS — N2581 Secondary hyperparathyroidism of renal origin: Secondary | ICD-10-CM | POA: Diagnosis not present

## 2019-07-04 DIAGNOSIS — E875 Hyperkalemia: Secondary | ICD-10-CM | POA: Diagnosis not present

## 2019-07-04 DIAGNOSIS — N186 End stage renal disease: Secondary | ICD-10-CM | POA: Diagnosis not present

## 2019-07-04 DIAGNOSIS — Z23 Encounter for immunization: Secondary | ICD-10-CM | POA: Diagnosis not present

## 2019-07-06 DIAGNOSIS — Z992 Dependence on renal dialysis: Secondary | ICD-10-CM | POA: Diagnosis not present

## 2019-07-06 DIAGNOSIS — E039 Hypothyroidism, unspecified: Secondary | ICD-10-CM | POA: Diagnosis not present

## 2019-07-06 DIAGNOSIS — N186 End stage renal disease: Secondary | ICD-10-CM | POA: Diagnosis not present

## 2019-07-06 DIAGNOSIS — Z23 Encounter for immunization: Secondary | ICD-10-CM | POA: Diagnosis not present

## 2019-07-06 DIAGNOSIS — E875 Hyperkalemia: Secondary | ICD-10-CM | POA: Diagnosis not present

## 2019-07-06 DIAGNOSIS — D689 Coagulation defect, unspecified: Secondary | ICD-10-CM | POA: Diagnosis not present

## 2019-07-06 DIAGNOSIS — N2581 Secondary hyperparathyroidism of renal origin: Secondary | ICD-10-CM | POA: Diagnosis not present

## 2019-07-08 DIAGNOSIS — N2581 Secondary hyperparathyroidism of renal origin: Secondary | ICD-10-CM | POA: Diagnosis not present

## 2019-07-08 DIAGNOSIS — N186 End stage renal disease: Secondary | ICD-10-CM | POA: Diagnosis not present

## 2019-07-08 DIAGNOSIS — E875 Hyperkalemia: Secondary | ICD-10-CM | POA: Diagnosis not present

## 2019-07-08 DIAGNOSIS — D689 Coagulation defect, unspecified: Secondary | ICD-10-CM | POA: Diagnosis not present

## 2019-07-08 DIAGNOSIS — Z23 Encounter for immunization: Secondary | ICD-10-CM | POA: Diagnosis not present

## 2019-07-08 DIAGNOSIS — Z992 Dependence on renal dialysis: Secondary | ICD-10-CM | POA: Diagnosis not present

## 2019-07-11 DIAGNOSIS — D689 Coagulation defect, unspecified: Secondary | ICD-10-CM | POA: Diagnosis not present

## 2019-07-11 DIAGNOSIS — N2581 Secondary hyperparathyroidism of renal origin: Secondary | ICD-10-CM | POA: Diagnosis not present

## 2019-07-11 DIAGNOSIS — E875 Hyperkalemia: Secondary | ICD-10-CM | POA: Diagnosis not present

## 2019-07-11 DIAGNOSIS — Z23 Encounter for immunization: Secondary | ICD-10-CM | POA: Diagnosis not present

## 2019-07-11 DIAGNOSIS — Z992 Dependence on renal dialysis: Secondary | ICD-10-CM | POA: Diagnosis not present

## 2019-07-11 DIAGNOSIS — N186 End stage renal disease: Secondary | ICD-10-CM | POA: Diagnosis not present

## 2019-07-13 DIAGNOSIS — Z23 Encounter for immunization: Secondary | ICD-10-CM | POA: Diagnosis not present

## 2019-07-13 DIAGNOSIS — E875 Hyperkalemia: Secondary | ICD-10-CM | POA: Diagnosis not present

## 2019-07-13 DIAGNOSIS — D689 Coagulation defect, unspecified: Secondary | ICD-10-CM | POA: Diagnosis not present

## 2019-07-13 DIAGNOSIS — Z992 Dependence on renal dialysis: Secondary | ICD-10-CM | POA: Diagnosis not present

## 2019-07-13 DIAGNOSIS — N2581 Secondary hyperparathyroidism of renal origin: Secondary | ICD-10-CM | POA: Diagnosis not present

## 2019-07-13 DIAGNOSIS — N186 End stage renal disease: Secondary | ICD-10-CM | POA: Diagnosis not present

## 2019-07-15 DIAGNOSIS — N139 Obstructive and reflux uropathy, unspecified: Secondary | ICD-10-CM | POA: Diagnosis not present

## 2019-07-15 DIAGNOSIS — D509 Iron deficiency anemia, unspecified: Secondary | ICD-10-CM | POA: Diagnosis not present

## 2019-07-15 DIAGNOSIS — E875 Hyperkalemia: Secondary | ICD-10-CM | POA: Diagnosis not present

## 2019-07-15 DIAGNOSIS — Z992 Dependence on renal dialysis: Secondary | ICD-10-CM | POA: Diagnosis not present

## 2019-07-15 DIAGNOSIS — N2581 Secondary hyperparathyroidism of renal origin: Secondary | ICD-10-CM | POA: Diagnosis not present

## 2019-07-15 DIAGNOSIS — N186 End stage renal disease: Secondary | ICD-10-CM | POA: Diagnosis not present

## 2019-07-15 DIAGNOSIS — Z23 Encounter for immunization: Secondary | ICD-10-CM | POA: Diagnosis not present

## 2019-07-18 DIAGNOSIS — E875 Hyperkalemia: Secondary | ICD-10-CM | POA: Diagnosis not present

## 2019-07-18 DIAGNOSIS — Z992 Dependence on renal dialysis: Secondary | ICD-10-CM | POA: Diagnosis not present

## 2019-07-18 DIAGNOSIS — N186 End stage renal disease: Secondary | ICD-10-CM | POA: Diagnosis not present

## 2019-07-18 DIAGNOSIS — Z23 Encounter for immunization: Secondary | ICD-10-CM | POA: Diagnosis not present

## 2019-07-18 DIAGNOSIS — D509 Iron deficiency anemia, unspecified: Secondary | ICD-10-CM | POA: Diagnosis not present

## 2019-07-18 DIAGNOSIS — N2581 Secondary hyperparathyroidism of renal origin: Secondary | ICD-10-CM | POA: Diagnosis not present

## 2019-07-20 DIAGNOSIS — E875 Hyperkalemia: Secondary | ICD-10-CM | POA: Diagnosis not present

## 2019-07-20 DIAGNOSIS — N2581 Secondary hyperparathyroidism of renal origin: Secondary | ICD-10-CM | POA: Diagnosis not present

## 2019-07-20 DIAGNOSIS — D509 Iron deficiency anemia, unspecified: Secondary | ICD-10-CM | POA: Diagnosis not present

## 2019-07-20 DIAGNOSIS — Z992 Dependence on renal dialysis: Secondary | ICD-10-CM | POA: Diagnosis not present

## 2019-07-20 DIAGNOSIS — N186 End stage renal disease: Secondary | ICD-10-CM | POA: Diagnosis not present

## 2019-07-20 DIAGNOSIS — Z23 Encounter for immunization: Secondary | ICD-10-CM | POA: Diagnosis not present

## 2019-07-22 DIAGNOSIS — N2581 Secondary hyperparathyroidism of renal origin: Secondary | ICD-10-CM | POA: Diagnosis not present

## 2019-07-22 DIAGNOSIS — Z23 Encounter for immunization: Secondary | ICD-10-CM | POA: Diagnosis not present

## 2019-07-22 DIAGNOSIS — N186 End stage renal disease: Secondary | ICD-10-CM | POA: Diagnosis not present

## 2019-07-22 DIAGNOSIS — Z992 Dependence on renal dialysis: Secondary | ICD-10-CM | POA: Diagnosis not present

## 2019-07-22 DIAGNOSIS — D509 Iron deficiency anemia, unspecified: Secondary | ICD-10-CM | POA: Diagnosis not present

## 2019-07-22 DIAGNOSIS — E875 Hyperkalemia: Secondary | ICD-10-CM | POA: Diagnosis not present

## 2019-07-25 DIAGNOSIS — Z23 Encounter for immunization: Secondary | ICD-10-CM | POA: Diagnosis not present

## 2019-07-25 DIAGNOSIS — E875 Hyperkalemia: Secondary | ICD-10-CM | POA: Diagnosis not present

## 2019-07-25 DIAGNOSIS — N2581 Secondary hyperparathyroidism of renal origin: Secondary | ICD-10-CM | POA: Diagnosis not present

## 2019-07-25 DIAGNOSIS — Z992 Dependence on renal dialysis: Secondary | ICD-10-CM | POA: Diagnosis not present

## 2019-07-25 DIAGNOSIS — D509 Iron deficiency anemia, unspecified: Secondary | ICD-10-CM | POA: Diagnosis not present

## 2019-07-25 DIAGNOSIS — N186 End stage renal disease: Secondary | ICD-10-CM | POA: Diagnosis not present

## 2019-07-27 DIAGNOSIS — N2581 Secondary hyperparathyroidism of renal origin: Secondary | ICD-10-CM | POA: Diagnosis not present

## 2019-07-27 DIAGNOSIS — N186 End stage renal disease: Secondary | ICD-10-CM | POA: Diagnosis not present

## 2019-07-27 DIAGNOSIS — Z992 Dependence on renal dialysis: Secondary | ICD-10-CM | POA: Diagnosis not present

## 2019-07-27 DIAGNOSIS — Z23 Encounter for immunization: Secondary | ICD-10-CM | POA: Diagnosis not present

## 2019-07-27 DIAGNOSIS — D509 Iron deficiency anemia, unspecified: Secondary | ICD-10-CM | POA: Diagnosis not present

## 2019-07-27 DIAGNOSIS — E875 Hyperkalemia: Secondary | ICD-10-CM | POA: Diagnosis not present

## 2019-07-29 DIAGNOSIS — Z23 Encounter for immunization: Secondary | ICD-10-CM | POA: Diagnosis not present

## 2019-07-29 DIAGNOSIS — N186 End stage renal disease: Secondary | ICD-10-CM | POA: Diagnosis not present

## 2019-07-29 DIAGNOSIS — D509 Iron deficiency anemia, unspecified: Secondary | ICD-10-CM | POA: Diagnosis not present

## 2019-07-29 DIAGNOSIS — N2581 Secondary hyperparathyroidism of renal origin: Secondary | ICD-10-CM | POA: Diagnosis not present

## 2019-07-29 DIAGNOSIS — Z992 Dependence on renal dialysis: Secondary | ICD-10-CM | POA: Diagnosis not present

## 2019-07-29 DIAGNOSIS — E875 Hyperkalemia: Secondary | ICD-10-CM | POA: Diagnosis not present

## 2019-08-01 DIAGNOSIS — D509 Iron deficiency anemia, unspecified: Secondary | ICD-10-CM | POA: Diagnosis not present

## 2019-08-01 DIAGNOSIS — E875 Hyperkalemia: Secondary | ICD-10-CM | POA: Diagnosis not present

## 2019-08-01 DIAGNOSIS — Z992 Dependence on renal dialysis: Secondary | ICD-10-CM | POA: Diagnosis not present

## 2019-08-01 DIAGNOSIS — N2581 Secondary hyperparathyroidism of renal origin: Secondary | ICD-10-CM | POA: Diagnosis not present

## 2019-08-01 DIAGNOSIS — N186 End stage renal disease: Secondary | ICD-10-CM | POA: Diagnosis not present

## 2019-08-01 DIAGNOSIS — Z23 Encounter for immunization: Secondary | ICD-10-CM | POA: Diagnosis not present

## 2019-08-02 ENCOUNTER — Other Ambulatory Visit: Payer: Self-pay | Admitting: *Deleted

## 2019-08-02 DIAGNOSIS — N186 End stage renal disease: Secondary | ICD-10-CM

## 2019-08-03 DIAGNOSIS — Z23 Encounter for immunization: Secondary | ICD-10-CM | POA: Diagnosis not present

## 2019-08-03 DIAGNOSIS — D509 Iron deficiency anemia, unspecified: Secondary | ICD-10-CM | POA: Diagnosis not present

## 2019-08-03 DIAGNOSIS — N2581 Secondary hyperparathyroidism of renal origin: Secondary | ICD-10-CM | POA: Diagnosis not present

## 2019-08-03 DIAGNOSIS — E875 Hyperkalemia: Secondary | ICD-10-CM | POA: Diagnosis not present

## 2019-08-03 DIAGNOSIS — Z992 Dependence on renal dialysis: Secondary | ICD-10-CM | POA: Diagnosis not present

## 2019-08-03 DIAGNOSIS — N186 End stage renal disease: Secondary | ICD-10-CM | POA: Diagnosis not present

## 2019-08-05 DIAGNOSIS — Z992 Dependence on renal dialysis: Secondary | ICD-10-CM | POA: Diagnosis not present

## 2019-08-05 DIAGNOSIS — Z23 Encounter for immunization: Secondary | ICD-10-CM | POA: Diagnosis not present

## 2019-08-05 DIAGNOSIS — E875 Hyperkalemia: Secondary | ICD-10-CM | POA: Diagnosis not present

## 2019-08-05 DIAGNOSIS — N186 End stage renal disease: Secondary | ICD-10-CM | POA: Diagnosis not present

## 2019-08-05 DIAGNOSIS — D509 Iron deficiency anemia, unspecified: Secondary | ICD-10-CM | POA: Diagnosis not present

## 2019-08-05 DIAGNOSIS — N2581 Secondary hyperparathyroidism of renal origin: Secondary | ICD-10-CM | POA: Diagnosis not present

## 2019-08-08 DIAGNOSIS — D509 Iron deficiency anemia, unspecified: Secondary | ICD-10-CM | POA: Diagnosis not present

## 2019-08-08 DIAGNOSIS — N2581 Secondary hyperparathyroidism of renal origin: Secondary | ICD-10-CM | POA: Diagnosis not present

## 2019-08-08 DIAGNOSIS — Z992 Dependence on renal dialysis: Secondary | ICD-10-CM | POA: Diagnosis not present

## 2019-08-08 DIAGNOSIS — Z23 Encounter for immunization: Secondary | ICD-10-CM | POA: Diagnosis not present

## 2019-08-08 DIAGNOSIS — N186 End stage renal disease: Secondary | ICD-10-CM | POA: Diagnosis not present

## 2019-08-08 DIAGNOSIS — E875 Hyperkalemia: Secondary | ICD-10-CM | POA: Diagnosis not present

## 2019-08-10 DIAGNOSIS — Z992 Dependence on renal dialysis: Secondary | ICD-10-CM | POA: Diagnosis not present

## 2019-08-10 DIAGNOSIS — Z23 Encounter for immunization: Secondary | ICD-10-CM | POA: Diagnosis not present

## 2019-08-10 DIAGNOSIS — N2581 Secondary hyperparathyroidism of renal origin: Secondary | ICD-10-CM | POA: Diagnosis not present

## 2019-08-10 DIAGNOSIS — D509 Iron deficiency anemia, unspecified: Secondary | ICD-10-CM | POA: Diagnosis not present

## 2019-08-10 DIAGNOSIS — N186 End stage renal disease: Secondary | ICD-10-CM | POA: Diagnosis not present

## 2019-08-10 DIAGNOSIS — E875 Hyperkalemia: Secondary | ICD-10-CM | POA: Diagnosis not present

## 2019-08-11 ENCOUNTER — Ambulatory Visit (HOSPITAL_COMMUNITY): Admission: RE | Admit: 2019-08-11 | Payer: Medicare Other | Source: Ambulatory Visit

## 2019-08-11 ENCOUNTER — Ambulatory Visit: Payer: Medicare Other | Admitting: Vascular Surgery

## 2019-08-11 ENCOUNTER — Ambulatory Visit (HOSPITAL_COMMUNITY): Payer: Medicare Other

## 2019-08-12 DIAGNOSIS — E875 Hyperkalemia: Secondary | ICD-10-CM | POA: Diagnosis not present

## 2019-08-12 DIAGNOSIS — D509 Iron deficiency anemia, unspecified: Secondary | ICD-10-CM | POA: Diagnosis not present

## 2019-08-12 DIAGNOSIS — N2581 Secondary hyperparathyroidism of renal origin: Secondary | ICD-10-CM | POA: Diagnosis not present

## 2019-08-12 DIAGNOSIS — Z23 Encounter for immunization: Secondary | ICD-10-CM | POA: Diagnosis not present

## 2019-08-12 DIAGNOSIS — N186 End stage renal disease: Secondary | ICD-10-CM | POA: Diagnosis not present

## 2019-08-12 DIAGNOSIS — Z992 Dependence on renal dialysis: Secondary | ICD-10-CM | POA: Diagnosis not present

## 2019-08-22 ENCOUNTER — Emergency Department (HOSPITAL_COMMUNITY): Payer: Medicare Other

## 2019-08-22 ENCOUNTER — Encounter (HOSPITAL_COMMUNITY): Payer: Self-pay

## 2019-08-22 ENCOUNTER — Emergency Department (HOSPITAL_COMMUNITY)
Admission: EM | Admit: 2019-08-22 | Discharge: 2019-08-22 | Disposition: A | Discharge status: Home / Self Care | Payer: Medicare Other | Attending: Emergency Medicine | Admitting: Emergency Medicine

## 2019-08-22 DIAGNOSIS — M79651 Pain in right thigh: Secondary | ICD-10-CM | POA: Diagnosis not present

## 2019-08-22 DIAGNOSIS — M25551 Pain in right hip: Secondary | ICD-10-CM | POA: Diagnosis not present

## 2019-08-22 DIAGNOSIS — E039 Hypothyroidism, unspecified: Secondary | ICD-10-CM | POA: Insufficient documentation

## 2019-08-22 DIAGNOSIS — R52 Pain, unspecified: Secondary | ICD-10-CM | POA: Diagnosis not present

## 2019-08-22 DIAGNOSIS — Z79899 Other long term (current) drug therapy: Secondary | ICD-10-CM | POA: Diagnosis not present

## 2019-08-22 DIAGNOSIS — N186 End stage renal disease: Secondary | ICD-10-CM | POA: Insufficient documentation

## 2019-08-22 DIAGNOSIS — I739 Peripheral vascular disease, unspecified: Secondary | ICD-10-CM | POA: Diagnosis not present

## 2019-08-22 DIAGNOSIS — M79604 Pain in right leg: Secondary | ICD-10-CM | POA: Diagnosis not present

## 2019-08-22 DIAGNOSIS — Z992 Dependence on renal dialysis: Secondary | ICD-10-CM | POA: Diagnosis not present

## 2019-08-22 IMAGING — CR DG HIP (WITH OR WITHOUT PELVIS) 2-3V*R*
3 series · 3 of 3 positions shown · non-contrast
Comparison: CT [DATE]

CLINICAL DATA: Right leg pain for 2 days, no injury

EXAM:
DG HIP (WITH OR WITHOUT PELVIS) 2-3V RIGHT

[pelvis ap]
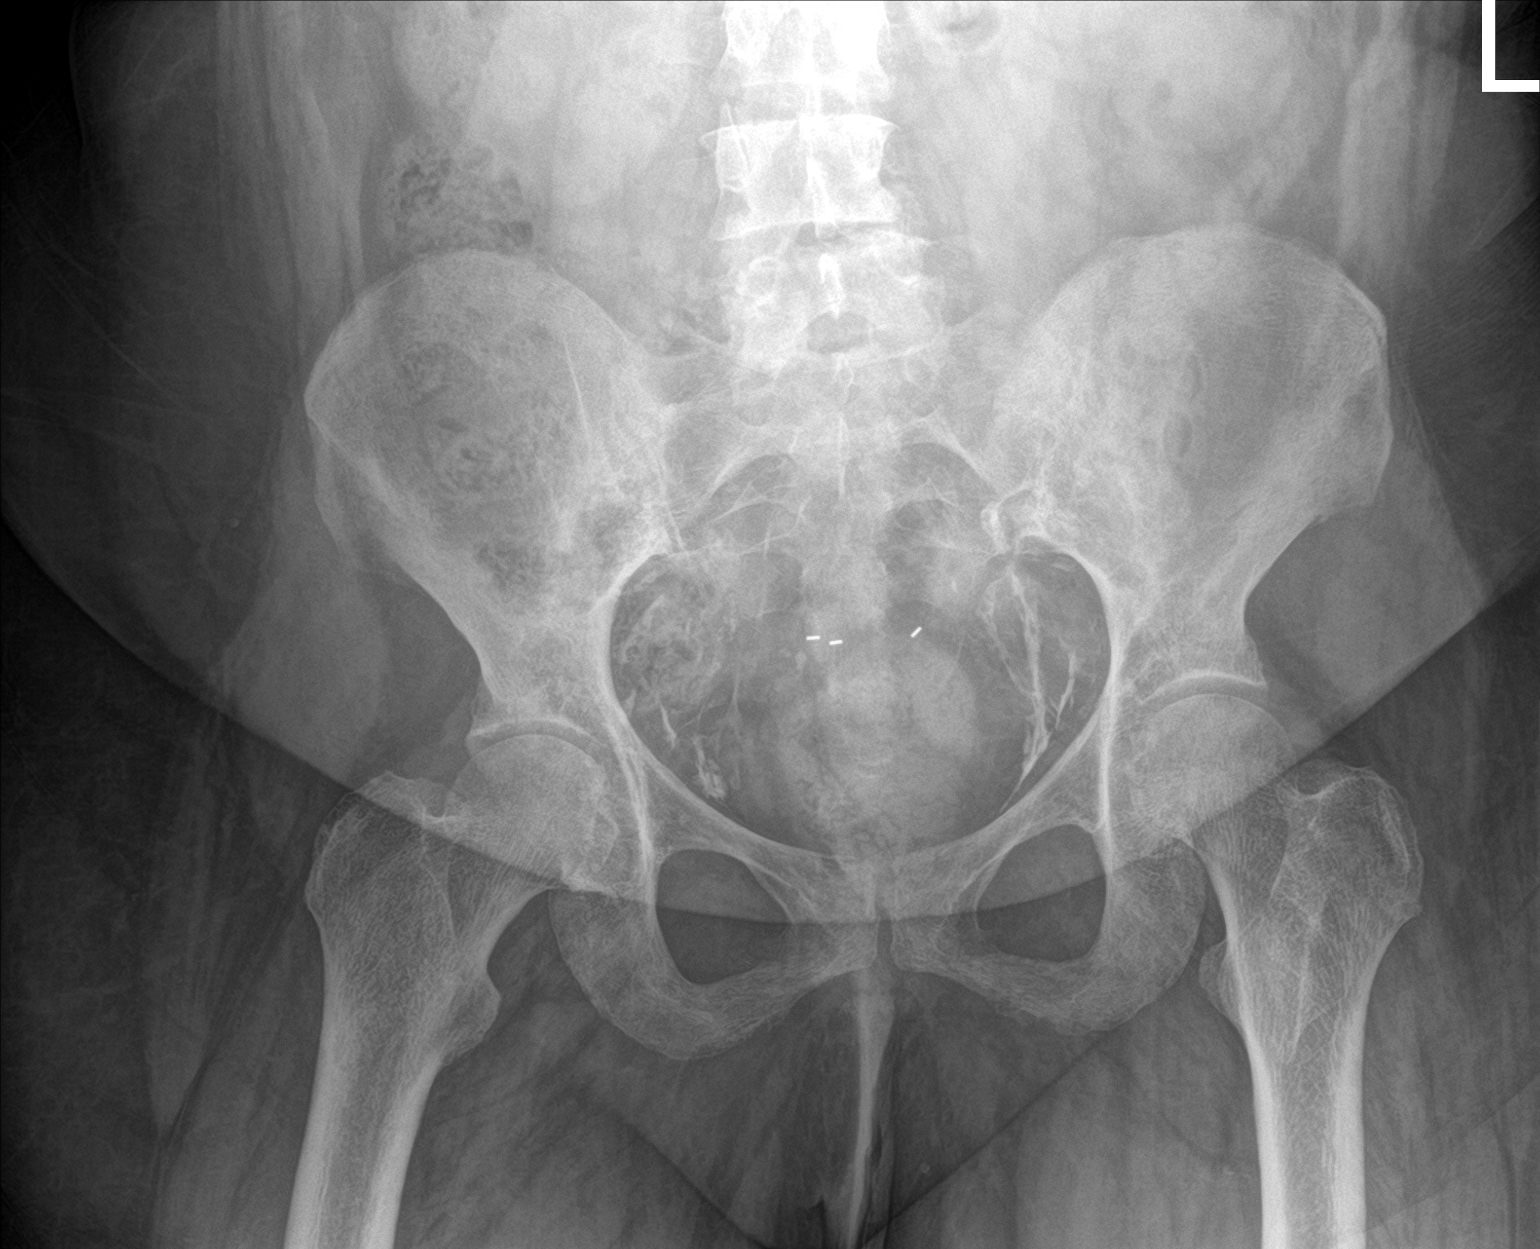

[hip ap]
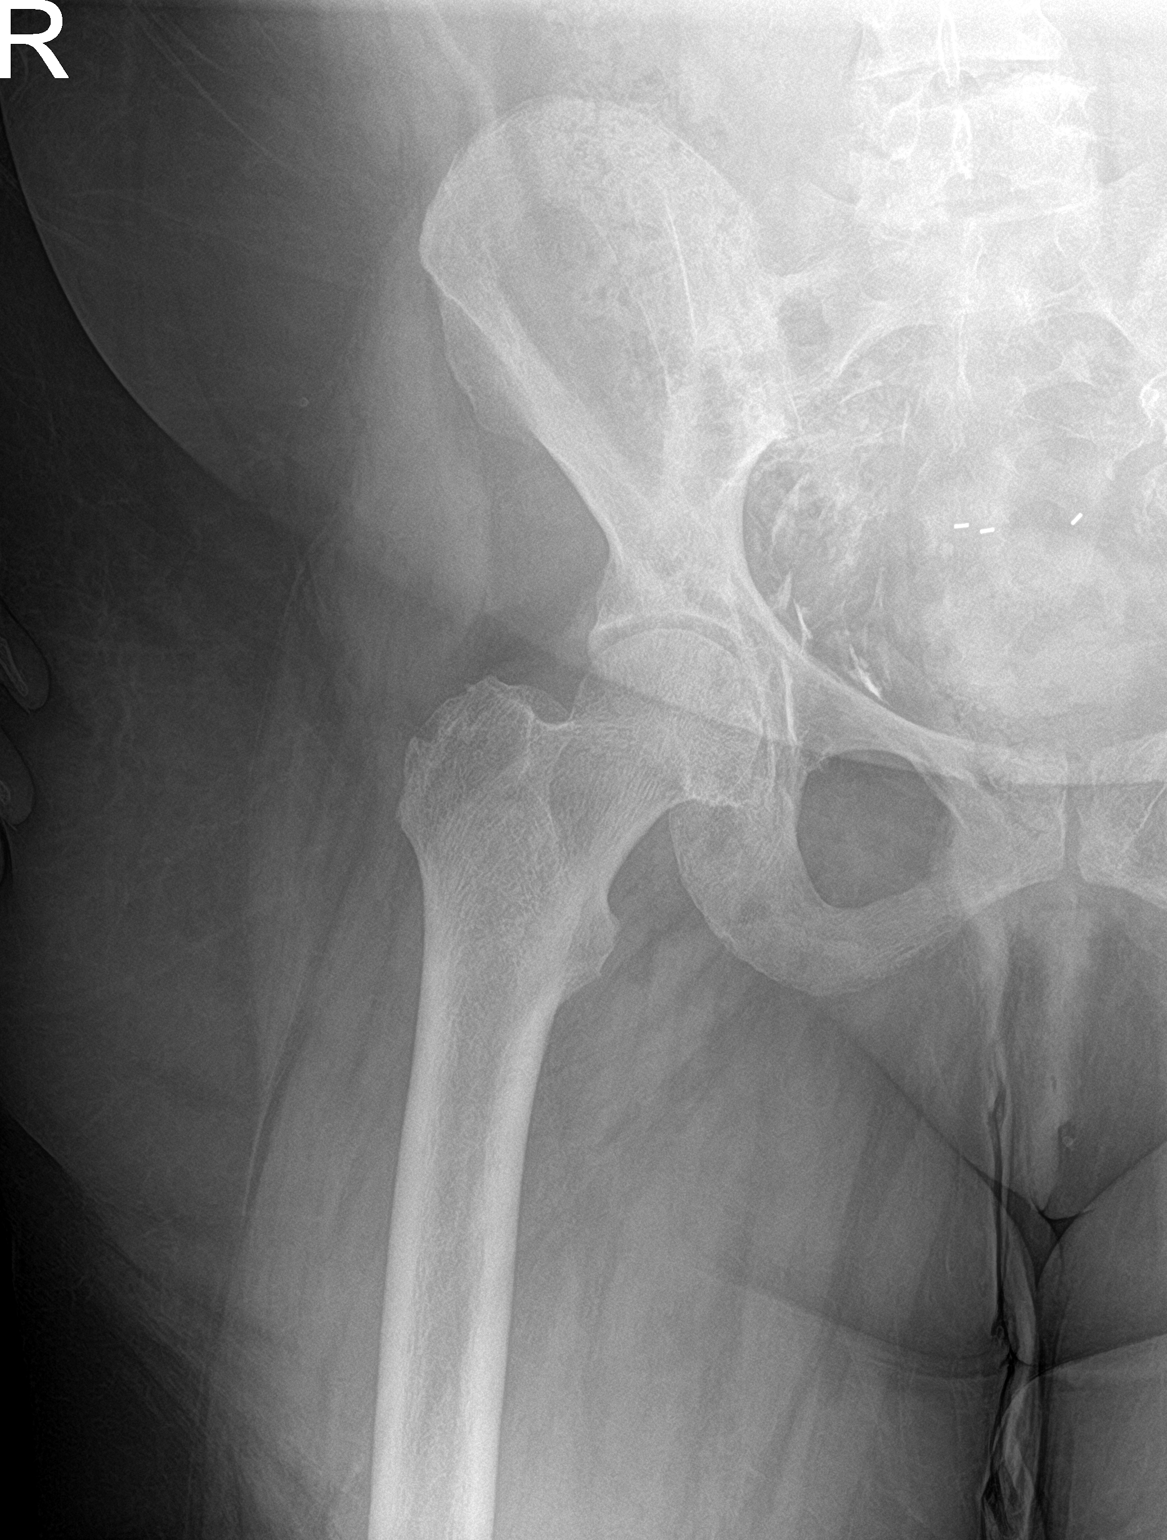

[hip lat]
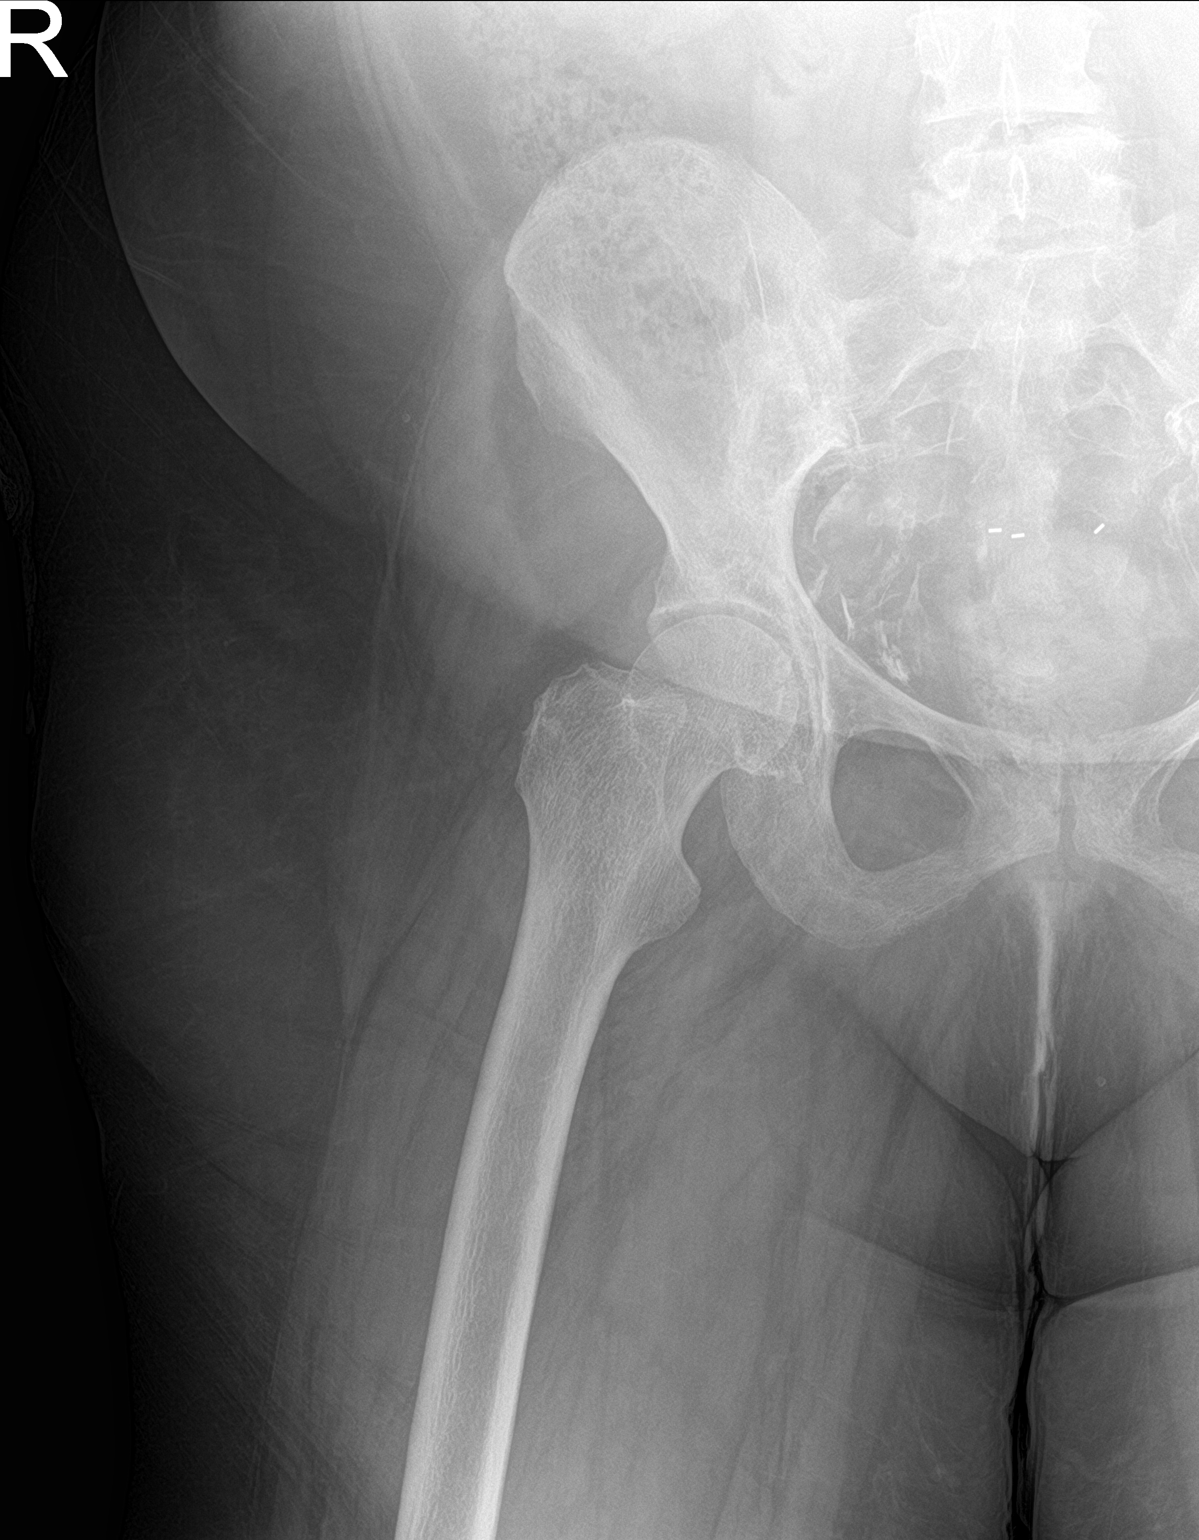

[3 of 3 positions shown; findings below may reference images not displayed]

FINDINGS: Suboptimal lateral projection. Bones of the pelvis are intact and
congruent. Proximal femora appear normally located. No definite
femoral fracture is visualized. Degenerative changes noted about the
lower lumbar spine, hips and pelvis. Serpentine calcifications along
the pelvic sidewalls are favored to be vascular in nature. Surgical
clips noted in the pelvis. Remaining soft tissues are unremarkable.
IMPRESSION: 1. No acute osseous abnormality. Suboptimal lateral projection. If
persisting clinical concern cross-sectional imaging could be
obtained.
2. Degenerative changes about the lower lumbar spine, hips and
pelvis.

## 2019-08-22 MED ORDER — HYDROCODONE-ACETAMINOPHEN 5-325 MG PO TABS
1.0000 | ORAL_TABLET | Freq: Once | ORAL | Status: AC
  Administered 2019-08-22: 1 via ORAL
  Filled 2019-08-22: qty 1

## 2019-08-22 MED ORDER — HYDROCODONE-ACETAMINOPHEN 5-325 MG PO TABS: 1.0000 | ORAL_TABLET | ORAL | 0 refills | Status: DC | PRN

## 2019-08-22 MED ORDER — DICLOFENAC SODIUM 1 % EX GEL: 4.0000 g | Freq: Four times a day (QID) | CUTANEOUS | 0 refills | Status: DC

## 2019-08-22 NOTE — ED Provider Notes (Signed)
Mary Wilcox Provider Note   CSN: [id number] Arrival date & time: 08/22/19  0549     History Chief Complaint  Patient presents with  . Sciatica    Mary Wilcox is a 64 y.o. female with hx of ESRD on dialysis and PVD who presents with right thigh pain. She states that it started yesterday when she got up off the couch.  She had an acute onset of pain in the right thigh.  The pain radiates from the right anterior hip to the right knee.  She is feels like the pain is deep.  She tried Tylenol without relief.  She last went to a full dialysis session on Saturday but missed dialysis today because she has been having difficulty walking.  She normally walks with a walker due to mobility issues at baseline.  She states that on Saturday she had to have help to get up to her apartment but did not feel any pain in the leg at that time although it was difficult going up the stairs.  She denies fever, chills, back pain, abdominal pain, nausea, vomiting.  She has chronic numbness from the right knee to the foot and is on gabapentin for neuropathy.  HPI     Past Medical History:  Diagnosis Date  . Bilateral hydronephrosis 2006   due to obstruction from cervical cancer  . Cataract    right eye  . Cervical cancer ([place]) 2006   IIIB s/p ext radiation and intracavitary cessium  . Chronic kidney disease    hx several episodes of ARF secondary to obstructive uropathy  . Depression   . Hypothyroidism   . Insomnia    takes Ambien 3x wk  . Iron deficiency anemia   . Peripheral vascular disease ([place])   . Secondary hyperparathyroidism (of renal origin)   . Transfusion history    02/2011 for Hgb 6.8  . Wears glasses     Patient Active Problem List   Diagnosis Date Noted  . Pressure injury of skin 04/04/2019  . Generalized weakness 02/11/2019  . Hyponatremia 02/11/2019  . Hyperkalemia 09/28/2018  . Bradycardia   . Mechanical complication of other vascular  device, implant, and graft 08/19/2011  . ESRD (end stage renal disease) ([place]) 08/05/2011  . Other complications due to renal dialysis device, implant, and graft 08/05/2011  . Secondary hyperparathyroidism (of renal origin)   . Anemia associated with chronic renal failure 03/06/2011  . Thrombocytosis ([place]) 03/06/2011  . Chronic kidney disease, stage V requiring chronic dialysis ([place]) 03/05/2011  . High anion gap metabolic acidosis 74/16/3845  . Hydronephrosis, bilateral 03/05/2011  . Chronic UTI 03/05/2011  . Leukocytosis 03/05/2011    Past Surgical History:  Procedure Laterality Date  . AV FISTULA PLACEMENT  03/05/2011   Procedure: INSERTION OF ARTERIOVENOUS (AV) GORE-TEX GRAFT ARM;  Surgeon: Angelia Mould, MD;  Location: [facility];  Service: Vascular;  Laterality: Left;  start 1115   finish 1250  . INSERTION OF DIALYSIS CATHETER  03/05/2011   Procedure: INSERTION OF DIALYSIS CATHETER;  Surgeon: Angelia Mould, MD;  Location: [facility];  Service: Vascular;  Laterality: Right;  start 1041- finish 1051  . IR FLUORO GUIDE CV LINE RIGHT  05/24/2019  . TUBAL LIGATION    . URETER SURGERY     stent, Dr. Risa Grill for bilateral hydro     OB History   No obstetric history on file.     Family History  Problem Relation Age of Onset  .  Lung cancer Father   . Cancer Father   . Cancer Mother        kidney and thyroid  . Heart disease Brother   . Diabetes Brother   . Hyperlipidemia Brother   . Hypertension Brother   . Other Brother        varicose vein    Social History   Tobacco Use  . Smoking status: Never Smoker  . Smokeless tobacco: Never Used  Substance Use Topics  . Alcohol use: Yes    Comment: rare glass of wine  . Drug use: No    Home Medications Prior to Admission medications   Medication Sig Start Date End Date Taking? Authorizing Provider  acetaminophen (TYLENOL) 500 MG tablet Take 1,000 mg by mouth in the morning and at bedtime.     [provider]   B Complex-C (SUPER B COMPLEX PO) Take 1 tablet by mouth daily.    [provider]  calcium acetate (PHOSLO) 667 MG capsule Take 667 mg by mouth See admin instructions. Take 1 capsule (667 mg) by mouth three times daily with meals and 2-3 times with snacks. 06/18/11   [provider]  cinacalcet (SENSIPAR) 90 MG tablet Take 180 mg by mouth at bedtime.     [provider]  ferric citrate (AURYXIA) 1 GM 210 MG(Fe) tablet Take 210-420 mg by mouth See admin instructions. Take 420 mb by mouth three times daily with meals and 210 mg, up to 3 times daily with snacks    [provider]  gabapentin (NEURONTIN) 300 MG capsule Take 300 mg by mouth in the morning and at bedtime.    [provider]  levothyroxine (SYNTHROID) 25 MCG tablet Take 25 mcg by mouth daily before breakfast. 08/10/18   [provider]  LOKELMA 10 g PACK packet Take 10 g by mouth once a week.  04/25/19   [provider]  midodrine (PROAMATINE) 10 MG tablet Take 10 mg by mouth See admin instructions. Take 10 mg by mouth twice on dialysis days (Tuesday, Thursday, Saturday) at 4am and 8am 09/11/18   [provider]  PROVENTIL HFA 108 (90 Base) MCG/ACT inhaler Inhale 2 puffs into the lungs every 6 (six) hours as needed for wheezing or shortness of breath.  04/11/19   [provider]  sertraline (ZOLOFT) 100 MG tablet Take 100 mg by mouth at bedtime.  09/17/18   [provider]  vitamin B-12 (CYANOCOBALAMIN) 1000 MCG tablet Take 1,000 mcg by mouth daily.    [provider]  zolpidem (AMBIEN) 10 MG tablet Take 10 mg by mouth at bedtime as needed for sleep.     [provider]    Allergies    Prozac [fluoxetine hcl]  Review of Systems   Review of Systems  Gastrointestinal: Negative for abdominal pain, nausea and vomiting.  Genitourinary: Positive for difficulty urinating (anuria).  Musculoskeletal: Positive for myalgias. Negative for back  pain.  Skin: Negative for wound.  Neurological: Positive for numbness. Negative for weakness.    Physical Exam Updated Vital Signs BP (!) 139/58 (BP Location: Right Arm)   Pulse 67   Temp 98.3 F (36.8 C) (Oral)   Resp 16   SpO2 100%   Physical Exam Vitals and nursing note reviewed.  Constitutional:      General: She is not in acute distress.    Appearance: She is well-developed. She is obese. She is not ill-appearing.  HENT:     Head: Normocephalic and  atraumatic.  Eyes:     General: No scleral icterus.       Right eye: No discharge.        Left eye: No discharge.     Conjunctiva/sclera: Conjunctivae normal.     Pupils: Pupils are equal, round, and reactive to light.  Cardiovascular:     Rate and Rhythm: Normal rate and regular rhythm.  Pulmonary:     Effort: Pulmonary effort is normal. No respiratory distress.     Breath sounds: Normal breath sounds.  Abdominal:     General: There is no distension.     Palpations: Abdomen is soft.     Tenderness: There is no abdominal tenderness.  Musculoskeletal:     Cervical back: Normal range of motion.     Comments: No low back tenderness.  RLE: No obvious swelling, deformity, redness or warmth. No tenderness. Pain is elicited with hip flexion. No calf tenderness or swelling. DP pulse not palpable. Pulse was obtained by doppler. Feet are warm and perfused although pt reports subjective numbness from the knee down.   Skin:    General: Skin is warm and dry.  Neurological:     Mental Status: She is alert and oriented to person, place, and time.  Psychiatric:        Behavior: Behavior normal.     ED Results / Procedures / Treatments   Labs (all labs ordered are listed, but only abnormal results are displayed) Labs Reviewed - No data to display  EKG None  Radiology DG HIP UNILAT WITH PELVIS 2-3 VIEWS RIGHT  Result Date: 08/22/2019 CLINICAL DATA:  Right leg pain for 2 days, no injury EXAM: DG HIP (WITH OR WITHOUT PELVIS)  2-3V RIGHT COMPARISON:  CT 05/29/2004 FINDINGS: Suboptimal lateral projection. Bones of the pelvis are intact and congruent. Proximal femora appear normally located. No definite femoral fracture is visualized. Degenerative changes noted about the lower lumbar spine, hips and pelvis. Serpentine calcifications along the pelvic sidewalls are favored to be vascular in nature. Surgical clips noted in the pelvis. Remaining soft tissues are unremarkable. IMPRESSION: 1. No acute osseous abnormality. Suboptimal lateral projection. If persisting clinical concern cross-sectional imaging could be obtained. 2. Degenerative changes about the lower lumbar spine, hips and pelvis. Electronically Signed   By: Lovena Le M.D.   On: 08/22/2019 06:30    Procedures Procedures (including critical care time)  Medications Ordered in ED Medications - No data to display  ED Course  I have reviewed the triage vital signs and the nursing notes.  Pertinent labs & imaging results that were available during my care of the patient were reviewed by me and considered in my medical decision making (see chart for details).  64 year old female presents with atraumatic R thigh pain for one day. Her vitals are normal. Exam is remarkable for pain in the thigh with ROM of the leg. There is no obvious deformity. Low concern for DVT or infectious process. Pulses were obtained with doppler and feet are warm and perfused. Xray of the hip/pelvis do not show any acute abnormality. I think most likely her symptoms are from a muscle strain from going up the stairs a couple days ago and she is just deconditioned. Pain control options are limited due to her ESRD. Shared visit with Dr. Francia Greaves. Will rx short course of pain medicine and voltaren gel. Advised f/u with PCP.  MDM Rules/Calculators/A&P  Final Clinical Impression(s) / ED Diagnoses Final diagnoses:  Right thigh pain    Rx / DC Orders ED Discharge Orders     None       Recardo Evangelist, PA-C 08/22/19 1149    Valarie Merino, MD 08/28/19 1147

## 2019-08-22 NOTE — Discharge Instructions (Signed)
Please rest and apply ice to the area several times a day Apply Voltaren gel 4 times daily for pain and inflammation Take Norco as needed for severe pain Please continue to use your walker at all times when out of bed Follow up with your doctor

## 2019-08-22 NOTE — ED Triage Notes (Signed)
Pt comes via [facility] for R leg pain that has been going on for two days, denies fall or injury

## 2019-08-22 NOTE — ED Notes (Signed)
Patient verbalizes understanding of discharge instructions. Opportunity for questioning and answers were provided. Pt discharged from ED. 

## 2019-09-14 DIAGNOSIS — N139 Obstructive and reflux uropathy, unspecified: Secondary | ICD-10-CM | POA: Diagnosis not present

## 2019-09-14 DIAGNOSIS — N186 End stage renal disease: Secondary | ICD-10-CM | POA: Diagnosis not present

## 2019-09-14 DIAGNOSIS — Z992 Dependence on renal dialysis: Secondary | ICD-10-CM | POA: Diagnosis not present

## 2019-09-14 DIAGNOSIS — N2581 Secondary hyperparathyroidism of renal origin: Secondary | ICD-10-CM | POA: Diagnosis not present

## 2019-09-15 ENCOUNTER — Encounter: Payer: Self-pay | Admitting: Vascular Surgery

## 2019-09-15 ENCOUNTER — Ambulatory Visit (HOSPITAL_COMMUNITY)
Admission: RE | Admit: 2019-09-15 | Discharge: 2019-09-15 | Disposition: A | Discharge status: Home / Self Care | Payer: Medicare Other | Source: Ambulatory Visit | Attending: Vascular Surgery | Admitting: Vascular Surgery

## 2019-09-15 ENCOUNTER — Other Ambulatory Visit: Payer: Self-pay

## 2019-09-15 ENCOUNTER — Ambulatory Visit (INDEPENDENT_AMBULATORY_CARE_PROVIDER_SITE_OTHER): Payer: Medicare Other | Admitting: Vascular Surgery

## 2019-09-15 ENCOUNTER — Ambulatory Visit (INDEPENDENT_AMBULATORY_CARE_PROVIDER_SITE_OTHER)
Admission: RE | Admit: 2019-09-15 | Discharge: 2019-09-15 | Disposition: A | Discharge status: Home / Self Care | Payer: Medicare Other | Source: Ambulatory Visit | Attending: Vascular Surgery | Admitting: Vascular Surgery

## 2019-09-15 VITALS — BP 114/72 | HR 70 | Temp 98.0°F

## 2019-09-15 DIAGNOSIS — N186 End stage renal disease: Secondary | ICD-10-CM

## 2019-09-15 MED ORDER — SODIUM CHLORIDE 0.9% FLUSH: 3.0000 mL | Freq: Two times a day (BID) | INTRAVENOUS | Status: AC

## 2019-09-15 MED ORDER — SODIUM CHLORIDE 0.9% FLUSH: 3.0000 mL | INTRAVENOUS | Status: AC | PRN

## 2019-09-15 MED ORDER — SODIUM CHLORIDE 0.9 % IV SOLN: 250.0000 mL | INTRAVENOUS | Status: AC | PRN

## 2019-09-15 NOTE — Progress Notes (Signed)
Patient ID: Mary Wilcox, female   DOB: April 04, 1955, 64 y.o.   MRN: [id number]  Reason for Consult: Follow-up   Referred by Kristen Loader, FNP  Subjective:     HPI:  Mary Wilcox is a 64 y.o. female with esrd on hd currently via tdc. Previously had left arm avg now thrombosed.  She is having issues with transportation also with steps and she is concerned about further surgery.  She has never had any breast surgery or right arm surgery.  She did have intervention on her previous graft prior to thrombosis.  Past Medical History:  Diagnosis Date  . Bilateral hydronephrosis 2006   due to obstruction from cervical cancer  . Cataract    right eye  . Cervical cancer ([place]) 2006   IIIB s/p ext radiation and intracavitary cessium  . Chronic kidney disease    hx several episodes of ARF secondary to obstructive uropathy  . Depression   . Hypothyroidism   . Insomnia    takes Ambien 3x wk  . Iron deficiency anemia   . Peripheral vascular disease ([place])   . Secondary hyperparathyroidism (of renal origin)   . Transfusion history    02/2011 for Hgb 6.8  . Wears glasses    Family History  Problem Relation Age of Onset  . Lung cancer Father   . Cancer Father   . Cancer Mother        kidney and thyroid  . Heart disease Brother   . Diabetes Brother   . Hyperlipidemia Brother   . Hypertension Brother   . Other Brother        varicose vein   Past Surgical History:  Procedure Laterality Date  . AV FISTULA PLACEMENT  03/05/2011   Procedure: INSERTION OF ARTERIOVENOUS (AV) GORE-TEX GRAFT ARM;  Surgeon: Angelia Mould, MD;  Location: [facility];  Service: Vascular;  Laterality: Left;  start 1115   finish 1250  . INSERTION OF DIALYSIS CATHETER  03/05/2011   Procedure: INSERTION OF DIALYSIS CATHETER;  Surgeon: Angelia Mould, MD;  Location: [facility];  Service: Vascular;  Laterality: Right;  start 1041- finish 1051  . IR FLUORO GUIDE CV LINE RIGHT  05/24/2019  . TUBAL LIGATION      . URETER SURGERY     stent, Dr. Risa Grill for bilateral hydro    Short Social History:  Social History   Tobacco Use  . Smoking status: Never Smoker  . Smokeless tobacco: Never Used  Substance Use Topics  . Alcohol use: Yes    Comment: rare glass of wine    Allergies  Allergen Reactions  . Prozac [Fluoxetine Hcl] Hives    Current Outpatient Medications  Medication Sig Dispense Refill  . acetaminophen (TYLENOL) 500 MG tablet Take 1,000 mg by mouth in the morning and at bedtime.     . B Complex-C (SUPER B COMPLEX PO) Take 1 tablet by mouth every other day.     . calcium acetate (PHOSLO) 667 MG capsule Take 667 mg by mouth See admin instructions. Take 1 capsule (667 mg) by mouth three times daily with meals and  snacks.    . cinacalcet (SENSIPAR) 90 MG tablet Take 90 mg by mouth at bedtime.     . diclofenac Sodium (VOLTAREN) 1 % GEL Apply 4 g topically 4 (four) times daily. 100 g 0  . ferric citrate (AURYXIA) 1 GM 210 MG(Fe) tablet Take 210-420 mg by mouth See admin instructions. Take 420mg  (2 tabs) three  times daily with meals and 210 mg,  daily with snacks    . gabapentin (NEURONTIN) 300 MG capsule Take 300 mg by mouth in the morning and at bedtime.    Marland Kitchen HYDROcodone-acetaminophen (NORCO/VICODIN) 5-325 MG tablet Take 1 tablet by mouth every 4 (four) hours as needed. 10 tablet 0  . levothyroxine (SYNTHROID) 25 MCG tablet Take 25 mcg by mouth daily before breakfast.    . midodrine (PROAMATINE) 10 MG tablet Take 10 mg by mouth See admin instructions. Take 10 mg by mouth twice on dialysis days (Tuesday, Thursday, Saturday) at 4am and 8am    . PROVENTIL HFA 108 (90 Base) MCG/ACT inhaler Inhale 2 puffs into the lungs every 6 (six) hours as needed for wheezing or shortness of breath.     . sertraline (ZOLOFT) 100 MG tablet Take 100 mg by mouth at bedtime.     . vitamin B-12 (CYANOCOBALAMIN) 1000 MCG tablet Take 1,000 mcg by mouth daily.    Marland Kitchen zolpidem (AMBIEN) 10 MG tablet Take 10 mg by mouth  at bedtime as needed for sleep.      No current facility-administered medications for this visit.    Review of Systems  Constitutional:  Constitutional negative. HENT: HENT negative.  Eyes: Eyes negative.  Respiratory: Respiratory negative.  Cardiovascular: Cardiovascular negative.  GI: Gastrointestinal negative.  Musculoskeletal: Musculoskeletal negative.  Skin: Skin negative.  Neurological: Neurological negative. Hematologic: Hematologic/lymphatic negative.  Psychiatric: Psychiatric negative.        Objective:  Objective  Vitals:   09/15/19 1551  BP: 114/72  Pulse: 70  Temp: 98 F (36.7 C)     Physical Exam HENT:     Head: Normocephalic.     Nose:     Comments: Mask in place Eyes:     Pupils: Pupils are equal, round, and reactive to light.  Cardiovascular:     Rate and Rhythm: Normal rate.     Pulses: Normal pulses.  Pulmonary:     Effort: Pulmonary effort is normal.  Abdominal:     General: Abdomen is flat.     Palpations: Abdomen is soft.  Musculoskeletal:        General: No swelling. Normal range of motion.     Cervical back: Normal range of motion.  Skin:    General: Skin is warm and dry.     Capillary Refill: Capillary refill takes less than 2 seconds.  Neurological:     General: No focal deficit present.     Mental Status: She is alert.  Psychiatric:        Mood and Affect: Mood normal.        Behavior: Behavior normal.        Thought Content: Thought content normal.        Judgment: Judgment normal.     Data: I have independently turbid her bilateral upper extremity vein mapping which does not really show any suitable vein for fistula creation.  I have independently interpreted her bilateral peripheral arterial duplex which demonstrates triphasic waveforms bilaterally with brachial arteries measuring 0.3 cm     Assessment/Plan:     64 year old female with history of end-stage renal disease on dialysis currently via catheter.  She has a  left arm graft that is thrombosed apparently had stents of the outflow.  She would like to keep access in the left arm.  I discussed with her that this may not be possible given the previous intervention she had on the graft and she is more likely  looking at a right arm graft or less likely basilic vein fistula creation.  We will begin with bilateral upper extremity venography on a nondialysis day to determine if she is a candidate for a left arm graft or not.  She demonstrates good understanding we will get her scheduled today.     Waynetta Sandy MD [facility]

## 2019-09-16 DIAGNOSIS — Z992 Dependence on renal dialysis: Secondary | ICD-10-CM | POA: Diagnosis not present

## 2019-09-16 DIAGNOSIS — N186 End stage renal disease: Secondary | ICD-10-CM | POA: Diagnosis not present

## 2019-09-16 DIAGNOSIS — N2581 Secondary hyperparathyroidism of renal origin: Secondary | ICD-10-CM | POA: Diagnosis not present

## 2019-09-19 DIAGNOSIS — N186 End stage renal disease: Secondary | ICD-10-CM | POA: Diagnosis not present

## 2019-09-19 DIAGNOSIS — Z992 Dependence on renal dialysis: Secondary | ICD-10-CM | POA: Diagnosis not present

## 2019-09-19 DIAGNOSIS — N2581 Secondary hyperparathyroidism of renal origin: Secondary | ICD-10-CM | POA: Diagnosis not present

## 2019-09-21 DIAGNOSIS — Z992 Dependence on renal dialysis: Secondary | ICD-10-CM | POA: Diagnosis not present

## 2019-09-21 DIAGNOSIS — N2581 Secondary hyperparathyroidism of renal origin: Secondary | ICD-10-CM | POA: Diagnosis not present

## 2019-09-21 DIAGNOSIS — N186 End stage renal disease: Secondary | ICD-10-CM | POA: Diagnosis not present

## 2019-09-23 DIAGNOSIS — N186 End stage renal disease: Secondary | ICD-10-CM | POA: Diagnosis not present

## 2019-09-23 DIAGNOSIS — Z992 Dependence on renal dialysis: Secondary | ICD-10-CM | POA: Diagnosis not present

## 2019-09-23 DIAGNOSIS — N2581 Secondary hyperparathyroidism of renal origin: Secondary | ICD-10-CM | POA: Diagnosis not present

## 2019-09-25 ENCOUNTER — Encounter (HOSPITAL_COMMUNITY)
Admission: RE | Disposition: A | Discharge status: Home / Self Care | Payer: Self-pay | Source: Home / Self Care | Attending: Vascular Surgery

## 2019-09-25 ENCOUNTER — Ambulatory Visit (HOSPITAL_COMMUNITY)
Admission: RE | Admit: 2019-09-25 | Discharge: 2019-09-25 | Disposition: A | Discharge status: Home / Self Care | Payer: Medicare Other | Attending: Vascular Surgery | Admitting: Vascular Surgery

## 2019-09-25 ENCOUNTER — Other Ambulatory Visit: Payer: Self-pay

## 2019-09-25 DIAGNOSIS — Z79890 Hormone replacement therapy (postmenopausal): Secondary | ICD-10-CM | POA: Diagnosis not present

## 2019-09-25 DIAGNOSIS — T82868A Thrombosis of vascular prosthetic devices, implants and grafts, initial encounter: Secondary | ICD-10-CM | POA: Diagnosis not present

## 2019-09-25 DIAGNOSIS — Z992 Dependence on renal dialysis: Secondary | ICD-10-CM | POA: Insufficient documentation

## 2019-09-25 DIAGNOSIS — N2581 Secondary hyperparathyroidism of renal origin: Secondary | ICD-10-CM | POA: Diagnosis not present

## 2019-09-25 DIAGNOSIS — I739 Peripheral vascular disease, unspecified: Secondary | ICD-10-CM | POA: Insufficient documentation

## 2019-09-25 DIAGNOSIS — N186 End stage renal disease: Secondary | ICD-10-CM | POA: Diagnosis not present

## 2019-09-25 DIAGNOSIS — D509 Iron deficiency anemia, unspecified: Secondary | ICD-10-CM | POA: Insufficient documentation

## 2019-09-25 DIAGNOSIS — Z79899 Other long term (current) drug therapy: Secondary | ICD-10-CM | POA: Diagnosis not present

## 2019-09-25 DIAGNOSIS — F329 Major depressive disorder, single episode, unspecified: Secondary | ICD-10-CM | POA: Diagnosis not present

## 2019-09-25 DIAGNOSIS — Y841 Kidney dialysis as the cause of abnormal reaction of the patient, or of later complication, without mention of misadventure at the time of the procedure: Secondary | ICD-10-CM | POA: Insufficient documentation

## 2019-09-25 DIAGNOSIS — N185 Chronic kidney disease, stage 5: Secondary | ICD-10-CM

## 2019-09-25 DIAGNOSIS — E039 Hypothyroidism, unspecified: Secondary | ICD-10-CM | POA: Diagnosis not present

## 2019-09-25 HISTORY — PX: UPPER EXTREMITY VENOGRAPHY: CATH118272

## 2019-09-25 LAB — POCT I-STAT, CHEM 8
BUN: 41 mg/dL — ABNORMAL HIGH (ref 8–23)
Calcium, Ion: 1.04 mmol/L — ABNORMAL LOW (ref 1.15–1.40)
Chloride: 99 mmol/L (ref 98–111)
Creatinine, Ser: 9.9 mg/dL — ABNORMAL HIGH (ref 0.44–1.00)
Glucose, Bld: 89 mg/dL (ref 70–99)
HCT: 42 % (ref 36.0–46.0)
Hemoglobin: 14.3 g/dL (ref 12.0–15.0)
Potassium: 5.4 mmol/L — ABNORMAL HIGH (ref 3.5–5.1)
Sodium: 133 mmol/L — ABNORMAL LOW (ref 135–145)
TCO2: 24 mmol/L (ref 22–32)

## 2019-09-25 SURGERY — UPPER EXTREMITY VENOGRAPHY
Anesthesia: LOCAL | Laterality: Bilateral

## 2019-09-25 MED ORDER — IODIXANOL 320 MG/ML IV SOLN
INTRAVENOUS | Status: DC | PRN
  Administered 2019-09-25: 15 mL

## 2019-09-25 MED ORDER — HEPARIN (PORCINE) IN NACL 1000-0.9 UT/500ML-% IV SOLN
INTRAVENOUS | Status: AC
  Filled 2019-09-25: qty 500

## 2019-09-25 SURGICAL SUPPLY — 3 items
HOVERMATT SINGLE USE (MISCELLANEOUS) ×1 IMPLANT
STOPCOCK MORSE 400PSI 3WAY (MISCELLANEOUS) ×2 IMPLANT
TUBING CIL FLEX 10 FLL-RA (TUBING) ×2 IMPLANT

## 2019-09-25 NOTE — Discharge Instructions (Signed)
Venogram A venogram, or venography, is a procedure that uses an X-ray and dye (contrast) to examine how well the veins work and how blood flows through them. Contrast helps the veins show up on X-rays. A venogram may be done:  To evaluate abnormalities in the vein.  To identify clots within veins, such as deep vein thrombosis (DVT).  To map out the veins that might be needed for another procedure. Tell a health care provider about:  Any allergies you have, especially to medicines, shellfish, iodine, and contrast.  All medicines you are taking, including vitamins, herbs, eye drops, creams, and over-the-counter medicines.  Any problems you or family members have had with anesthetic medicines.  Any blood disorders you have.  Any surgeries you have had and any complications that occurred.  Any medical conditions you have.  Whether you are pregnant, may be pregnant, or are breastfeeding.  Any history of smoking or tobacco use. What are the risks? Generally, this is a safe procedure. However, problems may occur, including:  Infection.  Bleeding.  Blood clots.  Allergic reaction to medicines or contrast.  Damage to other structures or organs.  Kidney problems.  Increased risk of cancer. Being exposed to too much radiation over a lifetime can increase the risk of cancer. The risk is small. What happens before the procedure? Medicines Ask your health care provider about:  Changing or stopping your regular medicines. This is especially important if you are taking diabetes medicines or blood thinners.  Taking medicines such as aspirin and ibuprofen. These medicines can thin your blood. Do not take these medicines unless your health care provider tells you to take them.  Taking over-the-counter medicines, vitamins, herbs, and supplements. General instructions  Follow instructions from your health care provider about eating or drinking restrictions.  You may have blood tests  to check how well your kidneys and liver are working and how well your blood can clot.  Plan to have someone take you home from the hospital or clinic. What happens during the procedure?   An IV will be inserted into one of your veins.  You may be given a medicine to help you relax (sedative).  You will lie down on an X-ray table. The table may be tilted in different directions during the procedure to help the contrast move throughout your body. Safety straps will keep you secure if the table is tilted.  If veins in your arm or leg will be examined, a band may be wrapped around that arm or leg to keep the veins full of blood. This may cause your arm or leg to feel numb.  The contrast will be injected into your IV. You may have a hot, flushed feeling as it moves throughout your body. You may also have a metallic taste in your mouth. Both of these sensations will go away after the test is complete.  You may be asked to lie in different positions or place your legs or arms in different positions.  At the end of the procedure, you may be given IV fluids to help wash or flush the contrast out of your veins.  The IV will be removed, and pressure will be applied to the IV site to prevent bleeding. A bandage (dressing) may be applied to the IV site. The exact procedure may vary among health care providers and hospitals. What can I expect after the procedure?  Your blood pressure, heart rate, breathing rate, and blood oxygen level will be monitored until you   leave the hospital or clinic.  You may be given something to eat and drink.  You may have bruising or mild discomfort in the area where the IV was inserted. Follow these instructions at home: Eating and drinking   Follow instructions from your health care provider about eating or drinking restrictions.  Drink a lot of water for the first several days after the procedure, as directed by your health care provider. This helps to flush the  contrast out of your body. Activity  Rest as told by your health care provider.  Return to your normal activities as told by your health care provider. Ask your health care provider what activities are safe for you.  If you were given a sedative during your procedure, do not drive for 24 hours or until your health care provider approves. General instructions  Check your IV insertion area every day for signs of infection. Check for: ? Redness, swelling, or pain. ? Fluid or blood. ? Warmth. ? Pus or a bad smell.  Take over-the-counter and prescription medicines only as told by your health care provider.  Keep all follow-up visits as told by your health care provider. This is important. Contact a health care provider if:  Your skin becomes itchy or you develop a rash or hives.  You have a fever that does not get better with medicine.  You feel nauseous or you vomit.  You have redness, swelling, or pain around the insertion site.  You have fluid or blood coming from the insertion site.  Your insertion area feels warm to the touch.  You have pus or a bad smell coming from the insertion site. Get help right away if you:  Have shortness of breath or difficulty breathing.  Develop chest pain.  Faint.  Feel very dizzy. These symptoms may represent a serious problem that is an emergency. Do not wait to see if the symptoms will go away. Get medical help right away. Call your local emergency services (911 in the [place] Do not drive yourself to the hospital. Summary  A venogram, or venography, is a procedure that uses an X-ray and contrast dye to check how well the veins work and how blood flows through them.  An IV will be inserted into one of your veins in order to inject the contrast.  During the exam, you will lie on an X-ray table. The table may be tilted in different directions during the procedure to help the contrast move throughout your body. Safety straps will keep you  secure.  After the procedure, you will need to drink a lot of water to help wash or flush the contrast out of your body. This information is not intended to replace advice given to you by your health care provider. Make sure you discuss any questions you have with your health care provider. Document Revised: 10/08/2018 Document Reviewed: 10/08/2018 Elsevier Patient Education  2020 [employer]  

## 2019-09-25 NOTE — H&P (Signed)
   History and Physical Update  The patient was interviewed and re-examined.  The patient's previous History and Physical has been reviewed and is unchanged from recent office visit. Plan for bilateral upper extremity venography to plan future access.  Tyrion Glaude C. Donzetta Matters, MD [facility] Office: [phone] Pager: [phone]

## 2019-09-25 NOTE — Op Note (Signed)
    Patient name: Mary Wilcox MRN: [id number] DOB: 22-Apr-1955 Sex: female  09/25/2019 Pre-operative Diagnosis: ESRD Post-operative diagnosis:  Same Surgeon:  Erlene Quan C. Donzetta Matters, MD Procedure Performed:  bilateral upper extremity venography  Indications: 64 year old female on dialysis via catheter.  She has previous left upper extremity graft that has thrombosed with multiple previous interventions.  She is now indicated for venography.  Findings: Right side demonstrates central patency.  Left side has multiple stents which all appear occluded.  She will need to have right-sided fistula versus graft on a nondialysis day in the near future.   Procedure:  The patient was identified in the holding area and taken to room 8.  The patient was then placed supine on the table and a timeout called.  Bilateral upper extremity venography was performed with the above findings.  We will plan for right upper extremity fistula versus more likely graft in the near future.  Contrast: [place] Donzetta Matters, MD [facility] Office: [phone] Pager: [phone]

## 2019-09-26 ENCOUNTER — Other Ambulatory Visit: Payer: Self-pay

## 2019-09-26 ENCOUNTER — Encounter (HOSPITAL_COMMUNITY): Payer: Self-pay | Admitting: Vascular Surgery

## 2019-09-26 DIAGNOSIS — Z992 Dependence on renal dialysis: Secondary | ICD-10-CM | POA: Diagnosis not present

## 2019-09-26 DIAGNOSIS — N2581 Secondary hyperparathyroidism of renal origin: Secondary | ICD-10-CM | POA: Diagnosis not present

## 2019-09-26 DIAGNOSIS — N186 End stage renal disease: Secondary | ICD-10-CM | POA: Diagnosis not present

## 2019-09-28 DIAGNOSIS — N2581 Secondary hyperparathyroidism of renal origin: Secondary | ICD-10-CM | POA: Diagnosis not present

## 2019-09-28 DIAGNOSIS — N186 End stage renal disease: Secondary | ICD-10-CM | POA: Diagnosis not present

## 2019-09-28 DIAGNOSIS — Z992 Dependence on renal dialysis: Secondary | ICD-10-CM | POA: Diagnosis not present

## 2019-09-30 DIAGNOSIS — Z992 Dependence on renal dialysis: Secondary | ICD-10-CM | POA: Diagnosis not present

## 2019-09-30 DIAGNOSIS — N186 End stage renal disease: Secondary | ICD-10-CM | POA: Diagnosis not present

## 2019-09-30 DIAGNOSIS — N2581 Secondary hyperparathyroidism of renal origin: Secondary | ICD-10-CM | POA: Diagnosis not present

## 2019-10-03 DIAGNOSIS — N186 End stage renal disease: Secondary | ICD-10-CM | POA: Diagnosis not present

## 2019-10-03 DIAGNOSIS — Z992 Dependence on renal dialysis: Secondary | ICD-10-CM | POA: Diagnosis not present

## 2019-10-03 DIAGNOSIS — N2581 Secondary hyperparathyroidism of renal origin: Secondary | ICD-10-CM | POA: Diagnosis not present

## 2019-10-05 DIAGNOSIS — E039 Hypothyroidism, unspecified: Secondary | ICD-10-CM | POA: Diagnosis not present

## 2019-10-05 DIAGNOSIS — Z992 Dependence on renal dialysis: Secondary | ICD-10-CM | POA: Diagnosis not present

## 2019-10-05 DIAGNOSIS — N2581 Secondary hyperparathyroidism of renal origin: Secondary | ICD-10-CM | POA: Diagnosis not present

## 2019-10-05 DIAGNOSIS — N186 End stage renal disease: Secondary | ICD-10-CM | POA: Diagnosis not present

## 2019-10-07 DIAGNOSIS — N186 End stage renal disease: Secondary | ICD-10-CM | POA: Diagnosis not present

## 2019-10-07 DIAGNOSIS — N2581 Secondary hyperparathyroidism of renal origin: Secondary | ICD-10-CM | POA: Diagnosis not present

## 2019-10-07 DIAGNOSIS — Z992 Dependence on renal dialysis: Secondary | ICD-10-CM | POA: Diagnosis not present

## 2019-10-10 DIAGNOSIS — Z992 Dependence on renal dialysis: Secondary | ICD-10-CM | POA: Diagnosis not present

## 2019-10-10 DIAGNOSIS — N2581 Secondary hyperparathyroidism of renal origin: Secondary | ICD-10-CM | POA: Diagnosis not present

## 2019-10-10 DIAGNOSIS — N186 End stage renal disease: Secondary | ICD-10-CM | POA: Diagnosis not present

## 2019-10-12 DIAGNOSIS — Z992 Dependence on renal dialysis: Secondary | ICD-10-CM | POA: Diagnosis not present

## 2019-10-12 DIAGNOSIS — N186 End stage renal disease: Secondary | ICD-10-CM | POA: Diagnosis not present

## 2019-10-12 DIAGNOSIS — N2581 Secondary hyperparathyroidism of renal origin: Secondary | ICD-10-CM | POA: Diagnosis not present

## 2019-10-14 DIAGNOSIS — N2581 Secondary hyperparathyroidism of renal origin: Secondary | ICD-10-CM | POA: Diagnosis not present

## 2019-10-14 DIAGNOSIS — Z992 Dependence on renal dialysis: Secondary | ICD-10-CM | POA: Diagnosis not present

## 2019-10-14 DIAGNOSIS — N186 End stage renal disease: Secondary | ICD-10-CM | POA: Diagnosis not present

## 2019-10-15 DIAGNOSIS — N139 Obstructive and reflux uropathy, unspecified: Secondary | ICD-10-CM | POA: Diagnosis not present

## 2019-10-15 DIAGNOSIS — N186 End stage renal disease: Secondary | ICD-10-CM | POA: Diagnosis not present

## 2019-10-15 DIAGNOSIS — Z992 Dependence on renal dialysis: Secondary | ICD-10-CM | POA: Diagnosis not present

## 2019-10-17 DIAGNOSIS — Z992 Dependence on renal dialysis: Secondary | ICD-10-CM | POA: Diagnosis not present

## 2019-10-17 DIAGNOSIS — N2581 Secondary hyperparathyroidism of renal origin: Secondary | ICD-10-CM | POA: Diagnosis not present

## 2019-10-17 DIAGNOSIS — N186 End stage renal disease: Secondary | ICD-10-CM | POA: Diagnosis not present

## 2019-10-19 DIAGNOSIS — N2581 Secondary hyperparathyroidism of renal origin: Secondary | ICD-10-CM | POA: Diagnosis not present

## 2019-10-19 DIAGNOSIS — N186 End stage renal disease: Secondary | ICD-10-CM | POA: Diagnosis not present

## 2019-10-19 DIAGNOSIS — Z992 Dependence on renal dialysis: Secondary | ICD-10-CM | POA: Diagnosis not present

## 2019-10-21 DIAGNOSIS — Z992 Dependence on renal dialysis: Secondary | ICD-10-CM | POA: Diagnosis not present

## 2019-10-21 DIAGNOSIS — N186 End stage renal disease: Secondary | ICD-10-CM | POA: Diagnosis not present

## 2019-10-21 DIAGNOSIS — N2581 Secondary hyperparathyroidism of renal origin: Secondary | ICD-10-CM | POA: Diagnosis not present

## 2019-10-24 DIAGNOSIS — Z992 Dependence on renal dialysis: Secondary | ICD-10-CM | POA: Diagnosis not present

## 2019-10-24 DIAGNOSIS — N2581 Secondary hyperparathyroidism of renal origin: Secondary | ICD-10-CM | POA: Diagnosis not present

## 2019-10-24 DIAGNOSIS — N186 End stage renal disease: Secondary | ICD-10-CM | POA: Diagnosis not present

## 2019-10-26 DIAGNOSIS — N2581 Secondary hyperparathyroidism of renal origin: Secondary | ICD-10-CM | POA: Diagnosis not present

## 2019-10-26 DIAGNOSIS — N186 End stage renal disease: Secondary | ICD-10-CM | POA: Diagnosis not present

## 2019-10-26 DIAGNOSIS — Z992 Dependence on renal dialysis: Secondary | ICD-10-CM | POA: Diagnosis not present

## 2019-10-28 DIAGNOSIS — N186 End stage renal disease: Secondary | ICD-10-CM | POA: Diagnosis not present

## 2019-10-28 DIAGNOSIS — Z992 Dependence on renal dialysis: Secondary | ICD-10-CM | POA: Diagnosis not present

## 2019-10-28 DIAGNOSIS — N2581 Secondary hyperparathyroidism of renal origin: Secondary | ICD-10-CM | POA: Diagnosis not present

## 2019-10-30 ENCOUNTER — Other Ambulatory Visit (HOSPITAL_COMMUNITY): Payer: Medicare Other

## 2019-10-31 ENCOUNTER — Encounter (HOSPITAL_COMMUNITY): Payer: Self-pay | Admitting: Certified Registered Nurse Anesthetist

## 2019-10-31 DIAGNOSIS — Z992 Dependence on renal dialysis: Secondary | ICD-10-CM | POA: Diagnosis not present

## 2019-10-31 DIAGNOSIS — N186 End stage renal disease: Secondary | ICD-10-CM | POA: Diagnosis not present

## 2019-10-31 DIAGNOSIS — N2581 Secondary hyperparathyroidism of renal origin: Secondary | ICD-10-CM | POA: Diagnosis not present

## 2019-10-31 NOTE — Anesthesia Preprocedure Evaluation (Deleted)
Anesthesia Evaluation    Reviewed: Allergy & Precautions, [name] Chart, lab work & pertinent test results  Airway        Dental   Pulmonary neg pulmonary ROS,           Cardiovascular + Peripheral Vascular Disease    Hypotension- on midodrine    Neuro/Psych PSYCHIATRIC DISORDERS Depression negative neurological ROS     GI/Hepatic negative GI ROS, Neg liver ROS,   Endo/Other  Hypothyroidism   Renal/GU ESRF and DialysisRenal disease  negative genitourinary   Musculoskeletal negative musculoskeletal ROS (+)   Abdominal   Peds  Hematology  (+) Blood dyscrasia, anemia ,   Anesthesia Other Findings   Reproductive/Obstetrics Cervical ca s/p radiation                             Anesthesia Physical Anesthesia Plan  ASA: III  Anesthesia Plan: MAC and Regional   Post-op Pain Management:  Regional for Post-op pain   Induction:   PONV Risk Score and Plan: 2 and Propofol infusion and TIVA  Airway Management Planned: Natural Airway and Simple Face Mask  Additional Equipment: None  Intra-op Plan:   Post-operative Plan:   Informed Consent:   Plan Discussed with:   Anesthesia Plan Comments:         Anesthesia Quick Evaluation

## 2019-10-31 NOTE — Progress Notes (Signed)
Called pt for pre-op call. Pt states she has rescheduled her surgery. I asked if she had notified Dr. Claretha Cooper office and she stated yes. I called and left a message at the office for [facility] to make sure that she was aware of this.

## 2019-11-01 ENCOUNTER — Encounter (HOSPITAL_COMMUNITY): Admission: RE | Payer: Self-pay | Source: Home / Self Care

## 2019-11-01 ENCOUNTER — Ambulatory Visit (HOSPITAL_COMMUNITY): Admission: RE | Admit: 2019-11-01 | Payer: Medicare Other | Source: Home / Self Care | Admitting: Vascular Surgery

## 2019-11-01 SURGERY — ARTERIOVENOUS (AV) FISTULA CREATION
Anesthesia: Monitor Anesthesia Care | Laterality: Right

## 2019-11-02 DIAGNOSIS — N186 End stage renal disease: Secondary | ICD-10-CM | POA: Diagnosis not present

## 2019-11-02 DIAGNOSIS — Z992 Dependence on renal dialysis: Secondary | ICD-10-CM | POA: Diagnosis not present

## 2019-11-02 DIAGNOSIS — N2581 Secondary hyperparathyroidism of renal origin: Secondary | ICD-10-CM | POA: Diagnosis not present

## 2019-11-04 DIAGNOSIS — Z992 Dependence on renal dialysis: Secondary | ICD-10-CM | POA: Diagnosis not present

## 2019-11-04 DIAGNOSIS — N2581 Secondary hyperparathyroidism of renal origin: Secondary | ICD-10-CM | POA: Diagnosis not present

## 2019-11-04 DIAGNOSIS — N186 End stage renal disease: Secondary | ICD-10-CM | POA: Diagnosis not present

## 2019-11-07 DIAGNOSIS — Z992 Dependence on renal dialysis: Secondary | ICD-10-CM | POA: Diagnosis not present

## 2019-11-07 DIAGNOSIS — N186 End stage renal disease: Secondary | ICD-10-CM | POA: Diagnosis not present

## 2019-11-07 DIAGNOSIS — N2581 Secondary hyperparathyroidism of renal origin: Secondary | ICD-10-CM | POA: Diagnosis not present

## 2019-11-09 DIAGNOSIS — Z992 Dependence on renal dialysis: Secondary | ICD-10-CM | POA: Diagnosis not present

## 2019-11-09 DIAGNOSIS — N186 End stage renal disease: Secondary | ICD-10-CM | POA: Diagnosis not present

## 2019-11-09 DIAGNOSIS — N2581 Secondary hyperparathyroidism of renal origin: Secondary | ICD-10-CM | POA: Diagnosis not present

## 2019-11-11 DIAGNOSIS — N2581 Secondary hyperparathyroidism of renal origin: Secondary | ICD-10-CM | POA: Diagnosis not present

## 2019-11-11 DIAGNOSIS — Z992 Dependence on renal dialysis: Secondary | ICD-10-CM | POA: Diagnosis not present

## 2019-11-11 DIAGNOSIS — N186 End stage renal disease: Secondary | ICD-10-CM | POA: Diagnosis not present

## 2019-11-15 DIAGNOSIS — Z992 Dependence on renal dialysis: Secondary | ICD-10-CM | POA: Diagnosis not present

## 2019-11-15 DIAGNOSIS — N186 End stage renal disease: Secondary | ICD-10-CM | POA: Diagnosis not present

## 2019-11-15 DIAGNOSIS — N139 Obstructive and reflux uropathy, unspecified: Secondary | ICD-10-CM | POA: Diagnosis not present

## 2019-11-16 DIAGNOSIS — D631 Anemia in chronic kidney disease: Secondary | ICD-10-CM | POA: Diagnosis not present

## 2019-11-16 DIAGNOSIS — N186 End stage renal disease: Secondary | ICD-10-CM | POA: Diagnosis not present

## 2019-11-16 DIAGNOSIS — Z992 Dependence on renal dialysis: Secondary | ICD-10-CM | POA: Diagnosis not present

## 2019-11-16 DIAGNOSIS — E875 Hyperkalemia: Secondary | ICD-10-CM | POA: Diagnosis not present

## 2019-11-16 DIAGNOSIS — N2581 Secondary hyperparathyroidism of renal origin: Secondary | ICD-10-CM | POA: Diagnosis not present

## 2019-11-21 DIAGNOSIS — E875 Hyperkalemia: Secondary | ICD-10-CM | POA: Diagnosis not present

## 2019-11-21 DIAGNOSIS — Z7401 Bed confinement status: Secondary | ICD-10-CM | POA: Diagnosis not present

## 2019-11-21 DIAGNOSIS — Z992 Dependence on renal dialysis: Secondary | ICD-10-CM | POA: Diagnosis not present

## 2019-11-21 DIAGNOSIS — M2550 Pain in unspecified joint: Secondary | ICD-10-CM | POA: Diagnosis not present

## 2019-11-21 DIAGNOSIS — R5381 Other malaise: Secondary | ICD-10-CM | POA: Diagnosis not present

## 2019-11-21 DIAGNOSIS — N186 End stage renal disease: Secondary | ICD-10-CM | POA: Diagnosis not present

## 2019-11-21 DIAGNOSIS — R531 Weakness: Secondary | ICD-10-CM | POA: Diagnosis not present

## 2019-11-21 DIAGNOSIS — N2581 Secondary hyperparathyroidism of renal origin: Secondary | ICD-10-CM | POA: Diagnosis not present

## 2019-11-21 DIAGNOSIS — D631 Anemia in chronic kidney disease: Secondary | ICD-10-CM | POA: Diagnosis not present

## 2019-11-23 ENCOUNTER — Other Ambulatory Visit: Payer: Self-pay

## 2019-11-23 ENCOUNTER — Other Ambulatory Visit: Payer: Self-pay | Admitting: *Deleted

## 2019-11-23 DIAGNOSIS — N186 End stage renal disease: Secondary | ICD-10-CM | POA: Diagnosis not present

## 2019-11-23 DIAGNOSIS — D631 Anemia in chronic kidney disease: Secondary | ICD-10-CM | POA: Diagnosis not present

## 2019-11-23 DIAGNOSIS — Z992 Dependence on renal dialysis: Secondary | ICD-10-CM | POA: Diagnosis not present

## 2019-11-23 DIAGNOSIS — E875 Hyperkalemia: Secondary | ICD-10-CM | POA: Diagnosis not present

## 2019-11-23 DIAGNOSIS — N2581 Secondary hyperparathyroidism of renal origin: Secondary | ICD-10-CM | POA: Diagnosis not present

## 2019-11-23 NOTE — Progress Notes (Signed)
Opened in error

## 2019-11-25 DIAGNOSIS — N186 End stage renal disease: Secondary | ICD-10-CM | POA: Diagnosis not present

## 2019-11-25 DIAGNOSIS — E875 Hyperkalemia: Secondary | ICD-10-CM | POA: Diagnosis not present

## 2019-11-25 DIAGNOSIS — Z992 Dependence on renal dialysis: Secondary | ICD-10-CM | POA: Diagnosis not present

## 2019-11-25 DIAGNOSIS — D631 Anemia in chronic kidney disease: Secondary | ICD-10-CM | POA: Diagnosis not present

## 2019-11-25 DIAGNOSIS — N2581 Secondary hyperparathyroidism of renal origin: Secondary | ICD-10-CM | POA: Diagnosis not present

## 2019-11-27 ENCOUNTER — Other Ambulatory Visit (HOSPITAL_COMMUNITY): Payer: Medicare Other

## 2019-11-28 ENCOUNTER — Encounter (HOSPITAL_COMMUNITY): Payer: Self-pay | Admitting: Anesthesiology

## 2019-11-28 ENCOUNTER — Encounter: Payer: Self-pay | Admitting: *Deleted

## 2019-11-28 NOTE — Progress Notes (Addendum)
After numberous calls in the past 2 days, I reached Ms Mary Wilcox, she said that she needs to cancel surgery, I asked patient to call Dr. Anell Barr office and speak with the scheduler. I sent Kia a staff message letting know patient said she is rescheduling and I told her to call the office.

## 2019-12-01 DIAGNOSIS — N186 End stage renal disease: Secondary | ICD-10-CM | POA: Diagnosis not present

## 2019-12-01 DIAGNOSIS — D631 Anemia in chronic kidney disease: Secondary | ICD-10-CM | POA: Diagnosis not present

## 2019-12-01 DIAGNOSIS — N2581 Secondary hyperparathyroidism of renal origin: Secondary | ICD-10-CM | POA: Diagnosis not present

## 2019-12-01 DIAGNOSIS — Z992 Dependence on renal dialysis: Secondary | ICD-10-CM | POA: Diagnosis not present

## 2019-12-01 DIAGNOSIS — E875 Hyperkalemia: Secondary | ICD-10-CM | POA: Diagnosis not present

## 2019-12-02 DIAGNOSIS — N186 End stage renal disease: Secondary | ICD-10-CM | POA: Diagnosis not present

## 2019-12-02 DIAGNOSIS — Z992 Dependence on renal dialysis: Secondary | ICD-10-CM | POA: Diagnosis not present

## 2019-12-02 DIAGNOSIS — E875 Hyperkalemia: Secondary | ICD-10-CM | POA: Diagnosis not present

## 2019-12-02 DIAGNOSIS — D631 Anemia in chronic kidney disease: Secondary | ICD-10-CM | POA: Diagnosis not present

## 2019-12-02 DIAGNOSIS — N2581 Secondary hyperparathyroidism of renal origin: Secondary | ICD-10-CM | POA: Diagnosis not present

## 2019-12-07 DIAGNOSIS — D631 Anemia in chronic kidney disease: Secondary | ICD-10-CM | POA: Diagnosis not present

## 2019-12-07 DIAGNOSIS — Z992 Dependence on renal dialysis: Secondary | ICD-10-CM | POA: Diagnosis not present

## 2019-12-07 DIAGNOSIS — N186 End stage renal disease: Secondary | ICD-10-CM | POA: Diagnosis not present

## 2019-12-07 DIAGNOSIS — N2581 Secondary hyperparathyroidism of renal origin: Secondary | ICD-10-CM | POA: Diagnosis not present

## 2019-12-07 DIAGNOSIS — E875 Hyperkalemia: Secondary | ICD-10-CM | POA: Diagnosis not present

## 2019-12-09 DIAGNOSIS — N186 End stage renal disease: Secondary | ICD-10-CM | POA: Diagnosis not present

## 2019-12-09 DIAGNOSIS — D631 Anemia in chronic kidney disease: Secondary | ICD-10-CM | POA: Diagnosis not present

## 2019-12-09 DIAGNOSIS — N2581 Secondary hyperparathyroidism of renal origin: Secondary | ICD-10-CM | POA: Diagnosis not present

## 2019-12-09 DIAGNOSIS — Z992 Dependence on renal dialysis: Secondary | ICD-10-CM | POA: Diagnosis not present

## 2019-12-09 DIAGNOSIS — E875 Hyperkalemia: Secondary | ICD-10-CM | POA: Diagnosis not present

## 2019-12-16 DIAGNOSIS — D631 Anemia in chronic kidney disease: Secondary | ICD-10-CM | POA: Diagnosis not present

## 2019-12-16 DIAGNOSIS — Z992 Dependence on renal dialysis: Secondary | ICD-10-CM | POA: Diagnosis not present

## 2019-12-16 DIAGNOSIS — D509 Iron deficiency anemia, unspecified: Secondary | ICD-10-CM | POA: Diagnosis not present

## 2019-12-16 DIAGNOSIS — N2581 Secondary hyperparathyroidism of renal origin: Secondary | ICD-10-CM | POA: Diagnosis not present

## 2019-12-16 DIAGNOSIS — E875 Hyperkalemia: Secondary | ICD-10-CM | POA: Diagnosis not present

## 2019-12-16 DIAGNOSIS — N186 End stage renal disease: Secondary | ICD-10-CM | POA: Diagnosis not present

## 2019-12-18 ENCOUNTER — Other Ambulatory Visit (HOSPITAL_COMMUNITY): Payer: Medicare Other

## 2019-12-18 ENCOUNTER — Telehealth: Payer: Self-pay

## 2019-12-18 ENCOUNTER — Encounter (HOSPITAL_COMMUNITY): Payer: Self-pay | Admitting: Vascular Surgery

## 2019-12-18 NOTE — Telephone Encounter (Signed)
Spoke with patient who requested to cancel her surgery for dialysis access placement on tomorrow. Pt stated she needs to put surgery off for a few weeks until she find a new place to live preferably without stairs so she won't have difficulty after surgery. She prefers to call office back when ready to reschedule.

## 2019-12-19 DIAGNOSIS — D631 Anemia in chronic kidney disease: Secondary | ICD-10-CM | POA: Diagnosis not present

## 2019-12-19 DIAGNOSIS — N186 End stage renal disease: Secondary | ICD-10-CM | POA: Diagnosis not present

## 2019-12-19 DIAGNOSIS — E875 Hyperkalemia: Secondary | ICD-10-CM | POA: Diagnosis not present

## 2019-12-19 DIAGNOSIS — D509 Iron deficiency anemia, unspecified: Secondary | ICD-10-CM | POA: Diagnosis not present

## 2019-12-19 DIAGNOSIS — N2581 Secondary hyperparathyroidism of renal origin: Secondary | ICD-10-CM | POA: Diagnosis not present

## 2019-12-19 DIAGNOSIS — Z992 Dependence on renal dialysis: Secondary | ICD-10-CM | POA: Diagnosis not present

## 2019-12-20 ENCOUNTER — Ambulatory Visit (HOSPITAL_COMMUNITY): Admission: RE | Admit: 2019-12-20 | Payer: Medicare Other | Source: Home / Self Care | Admitting: Vascular Surgery

## 2019-12-20 SURGERY — ARTERIOVENOUS (AV) FISTULA CREATION
Anesthesia: Monitor Anesthesia Care | Laterality: Right

## 2019-12-22 DIAGNOSIS — F5101 Primary insomnia: Secondary | ICD-10-CM | POA: Diagnosis not present

## 2019-12-22 DIAGNOSIS — Z992 Dependence on renal dialysis: Secondary | ICD-10-CM | POA: Diagnosis not present

## 2019-12-22 DIAGNOSIS — E039 Hypothyroidism, unspecified: Secondary | ICD-10-CM | POA: Diagnosis not present

## 2019-12-22 DIAGNOSIS — F324 Major depressive disorder, single episode, in partial remission: Secondary | ICD-10-CM | POA: Diagnosis not present

## 2019-12-22 DIAGNOSIS — N186 End stage renal disease: Secondary | ICD-10-CM | POA: Diagnosis not present

## 2019-12-22 DIAGNOSIS — G629 Polyneuropathy, unspecified: Secondary | ICD-10-CM | POA: Diagnosis not present

## 2019-12-23 DIAGNOSIS — N2581 Secondary hyperparathyroidism of renal origin: Secondary | ICD-10-CM | POA: Diagnosis not present

## 2019-12-23 DIAGNOSIS — D631 Anemia in chronic kidney disease: Secondary | ICD-10-CM | POA: Diagnosis not present

## 2019-12-23 DIAGNOSIS — D509 Iron deficiency anemia, unspecified: Secondary | ICD-10-CM | POA: Diagnosis not present

## 2019-12-23 DIAGNOSIS — Z992 Dependence on renal dialysis: Secondary | ICD-10-CM | POA: Diagnosis not present

## 2019-12-23 DIAGNOSIS — E875 Hyperkalemia: Secondary | ICD-10-CM | POA: Diagnosis not present

## 2019-12-23 DIAGNOSIS — N186 End stage renal disease: Secondary | ICD-10-CM | POA: Diagnosis not present

## 2019-12-28 DIAGNOSIS — D509 Iron deficiency anemia, unspecified: Secondary | ICD-10-CM | POA: Diagnosis not present

## 2019-12-28 DIAGNOSIS — D631 Anemia in chronic kidney disease: Secondary | ICD-10-CM | POA: Diagnosis not present

## 2019-12-28 DIAGNOSIS — N186 End stage renal disease: Secondary | ICD-10-CM | POA: Diagnosis not present

## 2019-12-28 DIAGNOSIS — N2581 Secondary hyperparathyroidism of renal origin: Secondary | ICD-10-CM | POA: Diagnosis not present

## 2019-12-28 DIAGNOSIS — E875 Hyperkalemia: Secondary | ICD-10-CM | POA: Diagnosis not present

## 2019-12-28 DIAGNOSIS — Z992 Dependence on renal dialysis: Secondary | ICD-10-CM | POA: Diagnosis not present

## 2019-12-30 DIAGNOSIS — N186 End stage renal disease: Secondary | ICD-10-CM | POA: Diagnosis not present

## 2019-12-30 DIAGNOSIS — D631 Anemia in chronic kidney disease: Secondary | ICD-10-CM | POA: Diagnosis not present

## 2019-12-30 DIAGNOSIS — D509 Iron deficiency anemia, unspecified: Secondary | ICD-10-CM | POA: Diagnosis not present

## 2019-12-30 DIAGNOSIS — N2581 Secondary hyperparathyroidism of renal origin: Secondary | ICD-10-CM | POA: Diagnosis not present

## 2019-12-30 DIAGNOSIS — Z992 Dependence on renal dialysis: Secondary | ICD-10-CM | POA: Diagnosis not present

## 2019-12-30 DIAGNOSIS — E875 Hyperkalemia: Secondary | ICD-10-CM | POA: Diagnosis not present

## 2020-01-06 DIAGNOSIS — D509 Iron deficiency anemia, unspecified: Secondary | ICD-10-CM | POA: Diagnosis not present

## 2020-01-06 DIAGNOSIS — N186 End stage renal disease: Secondary | ICD-10-CM | POA: Diagnosis not present

## 2020-01-06 DIAGNOSIS — N2581 Secondary hyperparathyroidism of renal origin: Secondary | ICD-10-CM | POA: Diagnosis not present

## 2020-01-06 DIAGNOSIS — E039 Hypothyroidism, unspecified: Secondary | ICD-10-CM | POA: Diagnosis not present

## 2020-01-06 DIAGNOSIS — E875 Hyperkalemia: Secondary | ICD-10-CM | POA: Diagnosis not present

## 2020-01-06 DIAGNOSIS — D631 Anemia in chronic kidney disease: Secondary | ICD-10-CM | POA: Diagnosis not present

## 2020-01-06 DIAGNOSIS — Z992 Dependence on renal dialysis: Secondary | ICD-10-CM | POA: Diagnosis not present

## 2020-01-13 DIAGNOSIS — Z992 Dependence on renal dialysis: Secondary | ICD-10-CM | POA: Diagnosis not present

## 2020-01-13 DIAGNOSIS — E875 Hyperkalemia: Secondary | ICD-10-CM | POA: Diagnosis not present

## 2020-01-13 DIAGNOSIS — N2581 Secondary hyperparathyroidism of renal origin: Secondary | ICD-10-CM | POA: Diagnosis not present

## 2020-01-13 DIAGNOSIS — D631 Anemia in chronic kidney disease: Secondary | ICD-10-CM | POA: Diagnosis not present

## 2020-01-13 DIAGNOSIS — N186 End stage renal disease: Secondary | ICD-10-CM | POA: Diagnosis not present

## 2020-01-13 DIAGNOSIS — D509 Iron deficiency anemia, unspecified: Secondary | ICD-10-CM | POA: Diagnosis not present

## 2020-01-15 DIAGNOSIS — N139 Obstructive and reflux uropathy, unspecified: Secondary | ICD-10-CM | POA: Diagnosis not present

## 2020-01-15 DIAGNOSIS — Z992 Dependence on renal dialysis: Secondary | ICD-10-CM | POA: Diagnosis not present

## 2020-01-15 DIAGNOSIS — N186 End stage renal disease: Secondary | ICD-10-CM | POA: Diagnosis not present

## 2020-01-22 ENCOUNTER — Encounter (HOSPITAL_COMMUNITY): Payer: Self-pay

## 2020-01-22 ENCOUNTER — Inpatient Hospital Stay (HOSPITAL_COMMUNITY)
Admission: EM | Admit: 2020-01-22 | Discharge: 2020-01-31 | DRG: 640 | Disposition: A | Discharge status: Skilled Nursing Facility | Payer: Medicare Other | Attending: Internal Medicine | Admitting: Internal Medicine

## 2020-01-22 ENCOUNTER — Emergency Department (HOSPITAL_COMMUNITY): Payer: Medicare Other

## 2020-01-22 ENCOUNTER — Other Ambulatory Visit: Payer: Self-pay

## 2020-01-22 DIAGNOSIS — R001 Bradycardia, unspecified: Secondary | ICD-10-CM | POA: Diagnosis not present

## 2020-01-22 DIAGNOSIS — Z79899 Other long term (current) drug therapy: Secondary | ICD-10-CM | POA: Diagnosis not present

## 2020-01-22 DIAGNOSIS — D509 Iron deficiency anemia, unspecified: Secondary | ICD-10-CM | POA: Diagnosis present

## 2020-01-22 DIAGNOSIS — K644 Residual hemorrhoidal skin tags: Secondary | ICD-10-CM | POA: Diagnosis present

## 2020-01-22 DIAGNOSIS — K640 First degree hemorrhoids: Secondary | ICD-10-CM | POA: Diagnosis present

## 2020-01-22 DIAGNOSIS — N186 End stage renal disease: Secondary | ICD-10-CM | POA: Diagnosis not present

## 2020-01-22 DIAGNOSIS — E871 Hypo-osmolality and hyponatremia: Secondary | ICD-10-CM | POA: Diagnosis not present

## 2020-01-22 DIAGNOSIS — G8929 Other chronic pain: Secondary | ICD-10-CM | POA: Diagnosis present

## 2020-01-22 DIAGNOSIS — E8770 Fluid overload, unspecified: Secondary | ICD-10-CM | POA: Diagnosis not present

## 2020-01-22 DIAGNOSIS — K922 Gastrointestinal hemorrhage, unspecified: Secondary | ICD-10-CM

## 2020-01-22 DIAGNOSIS — Z209 Contact with and (suspected) exposure to unspecified communicable disease: Secondary | ICD-10-CM | POA: Diagnosis not present

## 2020-01-22 DIAGNOSIS — E875 Hyperkalemia: Secondary | ICD-10-CM | POA: Diagnosis present

## 2020-01-22 DIAGNOSIS — Z9115 Patient's noncompliance with renal dialysis: Secondary | ICD-10-CM

## 2020-01-22 DIAGNOSIS — R279 Unspecified lack of coordination: Secondary | ICD-10-CM | POA: Diagnosis not present

## 2020-01-22 DIAGNOSIS — N2581 Secondary hyperparathyroidism of renal origin: Secondary | ICD-10-CM | POA: Diagnosis not present

## 2020-01-22 DIAGNOSIS — D75839 Thrombocytosis, unspecified: Secondary | ICD-10-CM | POA: Diagnosis not present

## 2020-01-22 DIAGNOSIS — B888 Other specified infestations: Secondary | ICD-10-CM | POA: Diagnosis not present

## 2020-01-22 DIAGNOSIS — L89322 Pressure ulcer of left buttock, stage 2: Secondary | ICD-10-CM | POA: Diagnosis present

## 2020-01-22 DIAGNOSIS — A0472 Enterocolitis due to Clostridium difficile, not specified as recurrent: Secondary | ICD-10-CM | POA: Diagnosis not present

## 2020-01-22 DIAGNOSIS — R197 Diarrhea, unspecified: Secondary | ICD-10-CM

## 2020-01-22 DIAGNOSIS — Z20822 Contact with and (suspected) exposure to covid-19: Secondary | ICD-10-CM | POA: Diagnosis not present

## 2020-01-22 DIAGNOSIS — K2900 Acute gastritis without bleeding: Secondary | ICD-10-CM | POA: Diagnosis present

## 2020-01-22 DIAGNOSIS — R2243 Localized swelling, mass and lump, lower limb, bilateral: Secondary | ICD-10-CM | POA: Diagnosis not present

## 2020-01-22 DIAGNOSIS — R609 Edema, unspecified: Secondary | ICD-10-CM | POA: Diagnosis not present

## 2020-01-22 DIAGNOSIS — D649 Anemia, unspecified: Secondary | ICD-10-CM | POA: Diagnosis present

## 2020-01-22 DIAGNOSIS — E6609 Other obesity due to excess calories: Secondary | ICD-10-CM | POA: Diagnosis present

## 2020-01-22 DIAGNOSIS — Z8541 Personal history of malignant neoplasm of cervix uteri: Secondary | ICD-10-CM | POA: Diagnosis not present

## 2020-01-22 DIAGNOSIS — I120 Hypertensive chronic kidney disease with stage 5 chronic kidney disease or end stage renal disease: Secondary | ICD-10-CM | POA: Diagnosis not present

## 2020-01-22 DIAGNOSIS — I469 Cardiac arrest, cause unspecified: Secondary | ICD-10-CM | POA: Diagnosis not present

## 2020-01-22 DIAGNOSIS — Z6841 Body Mass Index (BMI) 40.0 and over, adult: Secondary | ICD-10-CM | POA: Diagnosis not present

## 2020-01-22 DIAGNOSIS — E66812 Obesity, class 2: Secondary | ICD-10-CM

## 2020-01-22 DIAGNOSIS — R195 Other fecal abnormalities: Secondary | ICD-10-CM | POA: Diagnosis not present

## 2020-01-22 DIAGNOSIS — I9589 Other hypotension: Secondary | ICD-10-CM | POA: Diagnosis present

## 2020-01-22 DIAGNOSIS — I10 Essential (primary) hypertension: Secondary | ICD-10-CM | POA: Diagnosis not present

## 2020-01-22 DIAGNOSIS — I739 Peripheral vascular disease, unspecified: Secondary | ICD-10-CM | POA: Diagnosis not present

## 2020-01-22 DIAGNOSIS — Z743 Need for continuous supervision: Secondary | ICD-10-CM | POA: Diagnosis not present

## 2020-01-22 DIAGNOSIS — Z992 Dependence on renal dialysis: Secondary | ICD-10-CM | POA: Diagnosis not present

## 2020-01-22 DIAGNOSIS — E039 Hypothyroidism, unspecified: Secondary | ICD-10-CM | POA: Diagnosis not present

## 2020-01-22 DIAGNOSIS — M7989 Other specified soft tissue disorders: Secondary | ICD-10-CM

## 2020-01-22 DIAGNOSIS — L89312 Pressure ulcer of right buttock, stage 2: Secondary | ICD-10-CM | POA: Diagnosis present

## 2020-01-22 DIAGNOSIS — D631 Anemia in chronic kidney disease: Secondary | ICD-10-CM | POA: Diagnosis present

## 2020-01-22 DIAGNOSIS — Z609 Problem related to social environment, unspecified: Secondary | ICD-10-CM

## 2020-01-22 DIAGNOSIS — Z6835 Body mass index (BMI) 35.0-35.9, adult: Secondary | ICD-10-CM

## 2020-01-22 HISTORY — DX: End stage renal disease: N18.6

## 2020-01-22 LAB — RENAL FUNCTION PANEL
Albumin: 2.9 g/dL — ABNORMAL LOW (ref 3.5–5.0)
Anion gap: 20 — ABNORMAL HIGH (ref 5–15)
BUN: 85 mg/dL — ABNORMAL HIGH (ref 8–23)
CO2: 19 mmol/L — ABNORMAL LOW (ref 22–32)
Calcium: 8.7 mg/dL — ABNORMAL LOW (ref 8.9–10.3)
Chloride: 97 mmol/L — ABNORMAL LOW (ref 98–111)
Creatinine, Ser: 15.27 mg/dL — ABNORMAL HIGH (ref 0.44–1.00)
GFR, Estimated: 2 mL/min — ABNORMAL LOW (ref >60)
Glucose, Bld: 101 mg/dL — ABNORMAL HIGH (ref 70–99)
Phosphorus: 7.3 mg/dL — ABNORMAL HIGH (ref 2.5–4.6)
Potassium: 5.6 mmol/L — ABNORMAL HIGH (ref 3.5–5.1)
Sodium: 136 mmol/L (ref 135–145)

## 2020-01-22 LAB — BASIC METABOLIC PANEL
Anion gap: 25 — ABNORMAL HIGH (ref 5–15)
BUN: 101 mg/dL — ABNORMAL HIGH (ref 8–23)
CO2: 14 mmol/L — ABNORMAL LOW (ref 22–32)
Calcium: 8.6 mg/dL — ABNORMAL LOW (ref 8.9–10.3)
Chloride: 96 mmol/L — ABNORMAL LOW (ref 98–111)
Creatinine, Ser: 17.8 mg/dL — ABNORMAL HIGH (ref 0.44–1.00)
GFR, Estimated: 2 mL/min — ABNORMAL LOW (ref >60)
Glucose, Bld: 87 mg/dL (ref 70–99)
Potassium: >7.5 mmol/L (ref 3.5–5.1)
Sodium: 135 mmol/L (ref 135–145)

## 2020-01-22 LAB — CBC
HCT: 28.7 % — ABNORMAL LOW (ref 36.0–46.0)
HCT: 23.4 % — ABNORMAL LOW (ref 36.0–46.0)
Hemoglobin: 8.7 g/dL — ABNORMAL LOW (ref 12.0–15.0)
Hemoglobin: 7.6 g/dL — ABNORMAL LOW (ref 12.0–15.0)
MCH: 30.9 pg (ref 26.0–34.0)
MCH: 32.9 pg (ref 26.0–34.0)
MCHC: 30.3 g/dL (ref 30.0–36.0)
MCHC: 32.5 g/dL (ref 30.0–36.0)
MCV: 101.8 fL — ABNORMAL HIGH (ref 80.0–100.0)
MCV: 101.3 fL — ABNORMAL HIGH (ref 80.0–100.0)
Platelets: 302 10*3/uL (ref 150–400)
Platelets: 277 10*3/uL (ref 150–400)
RBC: 2.82 MIL/uL — ABNORMAL LOW (ref 3.87–5.11)
RBC: 2.31 MIL/uL — ABNORMAL LOW (ref 3.87–5.11)
RDW: 15.6 % — ABNORMAL HIGH (ref 11.5–15.5)
RDW: 16 % — ABNORMAL HIGH (ref 11.5–15.5)
WBC: 13.3 10*3/uL — ABNORMAL HIGH (ref 4.0–10.5)
WBC: 9.3 10*3/uL (ref 4.0–10.5)
nRBC: 0 % (ref 0.0–0.2)
nRBC: 0 % (ref 0.0–0.2)

## 2020-01-22 LAB — RESPIRATORY PANEL BY RT PCR (FLU A&B, COVID)
Influenza A by PCR: NEGATIVE
Influenza B by PCR: NEGATIVE
SARS Coronavirus 2 by RT PCR: NEGATIVE

## 2020-01-22 LAB — POC OCCULT BLOOD, ED: Fecal Occult Bld: POSITIVE — AB

## 2020-01-22 IMAGING — DX DG CHEST 1V PORT
1 series · 1 of 1 positions shown · non-contrast
Comparison: [DATE]

CLINICAL DATA: Leg swelling.  Missed dialysis.

EXAM:
PORTABLE CHEST 1 VIEW

[chest]
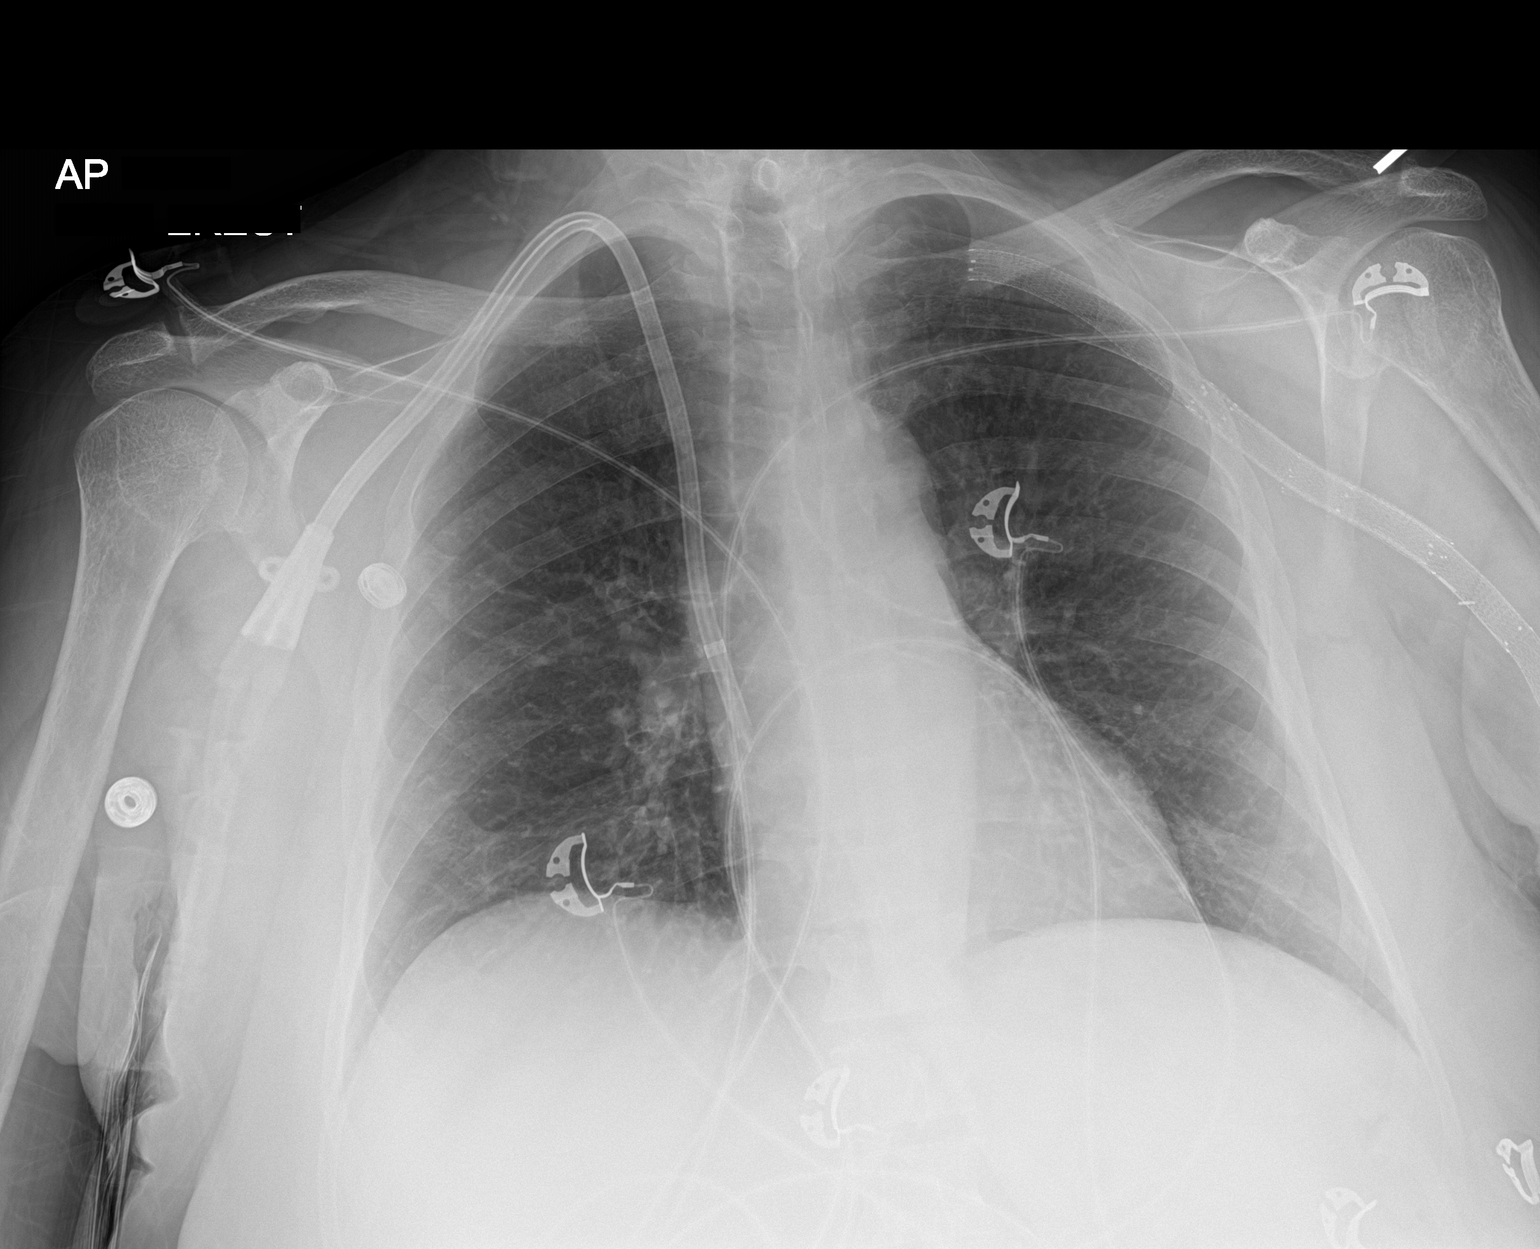

[1 of 1 positions shown; findings below may reference images not displayed]

FINDINGS: Right IJ approach hemodialysis catheter terminates at the level of
the superior cavoatrial junction. Partially visualized left axillary
and proximal left upper extremity stent. The heart size and
mediastinal contours are within normal limits. No focal airspace
consolidation, pleural effusion, or pneumothorax. The visualized
skeletal structures are unremarkable.
IMPRESSION: No acute cardiopulmonary findings.

## 2020-01-22 MED ORDER — DEXTROSE 50 % IV SOLN
1.0000 | Freq: Once | INTRAVENOUS | Status: AC
  Administered 2020-01-22: 50 mL via INTRAVENOUS
  Filled 2020-01-22: qty 50

## 2020-01-22 MED ORDER — GABAPENTIN 100 MG PO CAPS
100.0000 mg | ORAL_CAPSULE | Freq: Two times a day (BID) | ORAL | Status: DC
  Administered 2020-01-22 – 2020-01-30 (×17): 100 mg via ORAL
  Filled 2020-01-22 (×17): qty 1

## 2020-01-22 MED ORDER — MIDODRINE HCL 5 MG PO TABS
10.0000 mg | ORAL_TABLET | ORAL | Status: DC
  Filled 2020-01-22: qty 2

## 2020-01-22 MED ORDER — ACETAMINOPHEN 650 MG RE SUPP: 650.0000 mg | Freq: Four times a day (QID) | RECTAL | Status: DC | PRN

## 2020-01-22 MED ORDER — INSULIN ASPART 100 UNIT/ML IV SOLN
10.0000 [IU] | Freq: Once | INTRAVENOUS | Status: AC
  Administered 2020-01-22: 10 [IU] via INTRAVENOUS

## 2020-01-22 MED ORDER — CAMPHOR-MENTHOL 0.5-0.5 % EX LOTN
1.0000 "application " | TOPICAL_LOTION | Freq: Three times a day (TID) | CUTANEOUS | Status: DC | PRN
  Filled 2020-01-22: qty 222

## 2020-01-22 MED ORDER — SODIUM ZIRCONIUM CYCLOSILICATE 10 G PO PACK
10.0000 g | PACK | Freq: Every day | ORAL | Status: DC
  Administered 2020-01-22 – 2020-01-29 (×8): 10 g via ORAL
  Filled 2020-01-22 (×9): qty 1

## 2020-01-22 MED ORDER — CHLORHEXIDINE GLUCONATE CLOTH 2 % EX PADS
6.0000 | MEDICATED_PAD | Freq: Every day | CUTANEOUS | Status: DC
  Administered 2020-01-24 – 2020-01-25 (×2): 6 via TOPICAL

## 2020-01-22 MED ORDER — SODIUM CHLORIDE 0.9 % IV SOLN
1.0000 g | Freq: Once | INTRAVENOUS | Status: AC
  Administered 2020-01-22: 1 g via INTRAVENOUS
  Filled 2020-01-22: qty 10

## 2020-01-22 MED ORDER — CALCIUM ACETATE (PHOS BINDER) 667 MG PO CAPS
667.0000 mg | ORAL_CAPSULE | Freq: Three times a day (TID) | ORAL | Status: DC
  Administered 2020-01-23 – 2020-01-30 (×16): 667 mg via ORAL
  Filled 2020-01-22 (×17): qty 1

## 2020-01-22 MED ORDER — HYDRALAZINE HCL 20 MG/ML IJ SOLN
5.0000 mg | INTRAMUSCULAR | Status: DC | PRN
  Administered 2020-01-25 – 2020-01-29 (×4): 5 mg via INTRAVENOUS
  Filled 2020-01-22 (×7): qty 1

## 2020-01-22 MED ORDER — ONDANSETRON HCL 4 MG/2ML IJ SOLN: 4.0000 mg | Freq: Four times a day (QID) | INTRAMUSCULAR | Status: DC | PRN

## 2020-01-22 MED ORDER — CINACALCET HCL 30 MG PO TABS
90.0000 mg | ORAL_TABLET | Freq: Every day | ORAL | Status: DC
  Administered 2020-01-22 – 2020-01-30 (×9): 90 mg via ORAL
  Filled 2020-01-22 (×9): qty 3

## 2020-01-22 MED ORDER — ZOLPIDEM TARTRATE 5 MG PO TABS
5.0000 mg | ORAL_TABLET | Freq: Every evening | ORAL | Status: DC | PRN
  Administered 2020-01-23 – 2020-01-29 (×5): 5 mg via ORAL
  Filled 2020-01-22 (×5): qty 1

## 2020-01-22 MED ORDER — SERTRALINE HCL 50 MG PO TABS
150.0000 mg | ORAL_TABLET | Freq: Every day | ORAL | Status: DC
  Administered 2020-01-22 – 2020-01-30 (×9): 150 mg via ORAL
  Filled 2020-01-22 (×9): qty 1

## 2020-01-22 MED ORDER — NEPRO/CARBSTEADY PO LIQD
237.0000 mL | Freq: Three times a day (TID) | ORAL | Status: DC | PRN
  Filled 2020-01-22: qty 237

## 2020-01-22 MED ORDER — SORBITOL 70 % SOLN
30.0000 mL | Status: DC | PRN
  Filled 2020-01-22: qty 30

## 2020-01-22 MED ORDER — ALBUTEROL SULFATE HFA 108 (90 BASE) MCG/ACT IN AERS
2.0000 | INHALATION_SPRAY | Freq: Four times a day (QID) | RESPIRATORY_TRACT | Status: DC | PRN
  Filled 2020-01-22: qty 6.7

## 2020-01-22 MED ORDER — CALCIUM CARBONATE ANTACID 1250 MG/5ML PO SUSP
500.0000 mg | Freq: Four times a day (QID) | ORAL | Status: DC | PRN
  Filled 2020-01-22: qty 5

## 2020-01-22 MED ORDER — HEPARIN SODIUM (PORCINE) 1000 UNIT/ML IJ SOLN
INTRAMUSCULAR | Status: AC
  Filled 2020-01-22: qty 1

## 2020-01-22 MED ORDER — FERRIC CITRATE 1 GM 210 MG(FE) PO TABS
210.0000 mg | ORAL_TABLET | ORAL | Status: DC | PRN
  Filled 2020-01-22 (×6): qty 1

## 2020-01-22 MED ORDER — FERRIC CITRATE 1 GM 210 MG(FE) PO TABS
420.0000 mg | ORAL_TABLET | Freq: Three times a day (TID) | ORAL | Status: DC
  Administered 2020-01-23 – 2020-01-28 (×12): 420 mg via ORAL
  Administered 2020-01-28: 210 mg via ORAL
  Administered 2020-01-29 – 2020-01-30 (×4): 420 mg via ORAL
  Filled 2020-01-22 (×14): qty 2

## 2020-01-22 MED ORDER — PANTOPRAZOLE SODIUM 40 MG IV SOLR
40.0000 mg | Freq: Two times a day (BID) | INTRAVENOUS | Status: DC
  Administered 2020-01-22 – 2020-01-30 (×17): 40 mg via INTRAVENOUS
  Filled 2020-01-22 (×18): qty 40

## 2020-01-22 MED ORDER — ACETAMINOPHEN 325 MG PO TABS
650.0000 mg | ORAL_TABLET | Freq: Four times a day (QID) | ORAL | Status: DC | PRN
  Administered 2020-01-23 – 2020-01-28 (×8): 650 mg via ORAL
  Filled 2020-01-22 (×7): qty 2

## 2020-01-22 MED ORDER — HYDROXYZINE HCL 25 MG PO TABS: 25.0000 mg | ORAL_TABLET | Freq: Three times a day (TID) | ORAL | Status: DC | PRN

## 2020-01-22 MED ORDER — VITAMIN B-12 1000 MCG PO TABS
2000.0000 ug | ORAL_TABLET | Freq: Every day | ORAL | Status: DC
  Administered 2020-01-22 – 2020-01-23 (×2): 2000 ug via ORAL
  Filled 2020-01-22 (×2): qty 2

## 2020-01-22 MED ORDER — SODIUM CHLORIDE 0.9% FLUSH
3.0000 mL | Freq: Two times a day (BID) | INTRAVENOUS | Status: DC
  Administered 2020-01-22 – 2020-01-30 (×17): 3 mL via INTRAVENOUS

## 2020-01-22 MED ORDER — ONDANSETRON HCL 4 MG PO TABS: 4.0000 mg | ORAL_TABLET | Freq: Four times a day (QID) | ORAL | Status: DC | PRN

## 2020-01-22 MED ORDER — DOCUSATE SODIUM 283 MG RE ENEM
1.0000 | ENEMA | RECTAL | Status: DC | PRN
  Filled 2020-01-22: qty 1

## 2020-01-22 MED ORDER — CHLORHEXIDINE GLUCONATE CLOTH 2 % EX PADS: 6.0000 | MEDICATED_PAD | Freq: Every day | CUTANEOUS | Status: DC

## 2020-01-22 NOTE — Consult Note (Signed)
Referring Provider: ED Primary Care Physician:  Kristen Loader, FNP Primary Gastroenterologist:  Cory Roughen PCP)  Reason for Consultation:  Diarrhea, anemia  HPI: Mary Wilcox is a 64 y.o. female with history of ESRD (on HD), PVD, and remote cervical cancer presenting for consultation of diarrhea and anemia.  Patient presented to the ED because she missed hemodialysis.  States she has had diarrhea since Saturday.  She has been having approximately 4-5 loose/liquid stools per day, whereas previously she had 1-2 bowel movements per day.  Denies seeing any melena or hematochezia, though notes that her stools are typically dark due to iron.  Denies any sick contacts, recent antibiotic usage, or recent travel.  Denies any abdominal pain, nausea, vomiting, hematemesis, dysphagia, changes in appetite, unexplained weight loss.  Denies any NSAID, aspirin, or blood thinner use.  Per ED provider, stool was dark but not melenic, though it was heme positive.  She has never had a colonoscopy or EGD.  Denies any family history of colon cancer or gastrointestinal malignancy.   Past Medical History:  Diagnosis Date  . Bilateral hydronephrosis 2006   due to obstruction from cervical cancer  . Cataract    right eye  . Cervical cancer ([place]) 2006   IIIB s/p ext radiation and intracavitary cessium  . Chronic kidney disease    hx several episodes of ARF secondary to obstructive uropathy  . CKD (chronic kidney disease)    [facility]  . Depression   . Hypothyroidism   . Insomnia    takes Ambien 3x wk  . Iron deficiency anemia   . Peripheral vascular disease ([place])   . Secondary hyperparathyroidism (of renal origin)   . Transfusion history    02/2011 for Hgb 6.8  . Wears glasses     Past Surgical History:  Procedure Laterality Date  . AV FISTULA PLACEMENT  03/05/2011   Procedure: INSERTION OF ARTERIOVENOUS (AV) GORE-TEX GRAFT ARM;  Surgeon: Angelia Mould, MD;   Location: [place];  Service: Vascular;  Laterality: Left;  start 1115   finish 1250  . INSERTION OF DIALYSIS CATHETER  03/05/2011   Procedure: INSERTION OF DIALYSIS CATHETER;  Surgeon: Angelia Mould, MD;  Location: [place];  Service: Vascular;  Laterality: Right;  start 1041- finish 1051  . IR FLUORO GUIDE CV LINE RIGHT  05/24/2019  . TUBAL LIGATION    . UPPER EXTREMITY VENOGRAPHY Bilateral 09/25/2019   Procedure: UPPER EXTREMITY VENOGRAPHY;  Surgeon: Waynetta Sandy, MD;  Location: [place] [facility];  Service: Cardiovascular;  Laterality: Bilateral;  Bilateral   . URETER SURGERY     stent, Dr. Risa Grill for bilateral hydro    Prior to Admission medications   Medication Sig Start Date End Date Taking? Authorizing Provider  acetaminophen (TYLENOL) 500 MG tablet Take 1,000 mg by mouth every 8 (eight) hours as needed for moderate pain or headache.     [provider]  b complex vitamins tablet Take 1 tablet by mouth daily.    [provider]  calcium acetate (PHOSLO) 667 MG capsule Take 667 mg by mouth See admin instructions. Take 667 mg with meals and 667 mg with snacks 06/18/11   [provider]  cinacalcet (SENSIPAR) 90 MG tablet Take 90 mg by mouth at bedtime.     [provider]  cyanocobalamin 2000 MCG tablet Take 2,000 mcg by mouth daily.    [provider]  diclofenac Sodium (VOLTAREN) 1 % GEL Apply 4 g topically  4 (four) times daily. Patient not taking: Reported on 09/20/2019 08/22/19   Recardo Evangelist, PA-C  diphenhydrAMINE (BENADRYL) 25 MG tablet Take 25 mg by mouth daily as needed for allergies.    [provider]  ferric citrate (AURYXIA) 1 GM 210 MG(Fe) tablet Take 210-420 mg by mouth See admin instructions. Take 420mg  (2 tabs) three times daily with meals and 210 mg daily with snacks    [provider]  gabapentin (NEURONTIN) 100 MG capsule Take 100 mg by mouth in the morning and at bedtime.    [provider]  HYDROcodone-acetaminophen (NORCO/VICODIN) 5-325 MG tablet Take 1 tablet by mouth every 4 (four) hours as needed. Patient not taking: Reported on 09/20/2019 08/22/19   Recardo Evangelist, PA-C  midodrine (PROAMATINE) 10 MG tablet Take 10 mg by mouth See admin instructions. Take 10 mg by mouth twice on dialysis days (Tuesday, Thursday, Saturday) at 4am and 8am 09/11/18   [provider]  PROVENTIL HFA 108 (90 Base) MCG/ACT inhaler Inhale 2 puffs into the lungs every 6 (six) hours as needed for wheezing or shortness of breath.  04/11/19   [provider]  sertraline (ZOLOFT) 100 MG tablet Take 150 mg by mouth daily.  09/17/18   [provider]  sodium zirconium cyclosilicate (LOKELMA) 10 g PACK packet Take 10 g by mouth daily as needed (high potassium).    [provider]  zolpidem (AMBIEN) 10 MG tablet Take 10 mg by mouth at bedtime as needed for sleep.     [provider]    Scheduled Meds: . Chlorhexidine Gluconate Cloth  6 each Topical Q0600  . sodium chloride flush  3 mL Intravenous Q12H  . sodium zirconium cyclosilicate  10 g Oral Daily   Continuous Infusions: . sodium chloride    . calcium chloride  IV 1 g (01/22/20 0831)   PRN Meds:.sodium chloride, sodium chloride flush  Allergies as of 01/22/2020 - Review Complete 01/22/2020  Allergen Reaction Noted  . Prozac [fluoxetine hcl] Hives 03/04/2011    Family History  Problem Relation Age of Onset  . Lung cancer Father   . Cancer Father   . Cancer Mother        kidney and thyroid  . Heart disease Brother   . Diabetes Brother   . Hyperlipidemia Brother   . Hypertension Brother   . Other Brother        varicose vein    Social History   Socioeconomic History  . Marital status: Divorced    Spouse name: Not on file  . Number of children: Not on file  . Years of education: Not on file  . Highest education level: Not on file  Occupational History  . Not on file  Tobacco  Use  . Smoking status: Never Smoker  . Smokeless tobacco: Never Used  Vaping Use  . Vaping Use: Never used  Substance and Sexual Activity  . Alcohol use: Yes    Comment: rare glass of wine  . Drug use: No  . Sexual activity: Never    Birth control/protection: Surgical, Post-menopausal  Other Topics Concern  . Not on file  Social History Narrative  . Not on file   Social Determinants of Health   Financial Resource Strain:   . Difficulty of Paying Living Expenses: Not on file  Food Insecurity:   . Worried About Charity fundraiser in the Last Year: Not on file  . Ran Out of Food in the  Last Year: Not on file  Transportation Needs:   . Lack of Transportation (Medical): Not on file  . Lack of Transportation (Non-Medical): Not on file  Physical Activity:   . Days of Exercise per Week: Not on file  . Minutes of Exercise per Session: Not on file  Stress:   . Feeling of Stress : Not on file  Social Connections:   . Frequency of Communication with Friends and Family: Not on file  . Frequency of Social Gatherings with Friends and Family: Not on file  . Attends Religious Services: Not on file  . Active Member of Clubs or Organizations: Not on file  . Attends Archivist Meetings: Not on file  . Marital Status: Not on file  Intimate Partner Violence:   . Fear of Current or Ex-Partner: Not on file  . Emotionally Abused: Not on file  . Physically Abused: Not on file  . Sexually Abused: Not on file    Review of Systems: Review of Systems  Constitutional: Negative for chills, fever and weight loss.  HENT: Negative for hearing loss and tinnitus.   Eyes: Negative for pain and redness.  Respiratory: Negative for cough and shortness of breath.   Cardiovascular: Negative for chest pain and palpitations.  Gastrointestinal: Negative for abdominal pain, blood in stool, constipation, diarrhea, heartburn, melena, nausea and vomiting.  Genitourinary: Negative for dysuria and flank  pain.  Musculoskeletal: Negative for falls and joint pain.  Skin: Negative for itching and rash.  Neurological: Negative for seizures and loss of consciousness.  Endo/Heme/Allergies: Negative for polydipsia. Does not bruise/bleed easily.  Psychiatric/Behavioral: Negative for substance abuse. The patient is not nervous/anxious.      Physical Exam: Vital signs: Vitals:   01/22/20 0656 01/22/20 0835  BP:    Pulse:    Resp:    Temp:    SpO2: 100% 100%     Physical Exam Constitutional:      General: She is not in acute distress.    Appearance: She is obese.  HENT:     Head: Normocephalic and atraumatic.     Nose: Nose normal. No congestion.     Mouth/Throat:     Mouth: Mucous membranes are moist.     Pharynx: Oropharynx is clear.  Eyes:     General: No scleral icterus.    Extraocular Movements: Extraocular movements intact.     Comments: Mild conjunctival pallor  Cardiovascular:     Rate and Rhythm: Normal rate and regular rhythm.     Pulses: Normal pulses.     Heart sounds: Normal heart sounds.  Pulmonary:     Effort: Pulmonary effort is normal.     Breath sounds: Normal breath sounds.  Abdominal:     General: Bowel sounds are normal. There is no distension.     Palpations: Abdomen is soft. There is no mass.     Tenderness: There is no abdominal tenderness. There is no guarding or rebound.     Hernia: No hernia is present.  Musculoskeletal:     Cervical back: Normal range of motion and neck supple.     Right lower leg: Edema present.     Left lower leg: Edema present.  Skin:    General: Skin is warm and dry.  Neurological:     General: No focal deficit present.     Mental Status: She is oriented to person, place, and time. She is lethargic.  Psychiatric:        Mood and Affect:  Mood normal.        Behavior: Behavior normal. Behavior is cooperative.     GI:  Lab Results: Recent Labs    01/22/20 0650  WBC 13.3*  HGB 8.7*  HCT 28.7*  PLT 302    BMET Recent Labs    01/22/20 0650  NA 135  K >7.5*  CL 96*  CO2 14*  GLUCOSE 87  BUN 101*  CREATININE 17.80*  CALCIUM 8.6*   LFT No results for input(s): PROT, ALBUMIN, AST, ALT, ALKPHOS, BILITOT, BILIDIR, IBILI in the last 72 hours. PT/INR No results for input(s): LABPROT, INR in the last 72 hours.   Studies/Results: DG Chest Port 1 View  Result Date: 01/22/2020 CLINICAL DATA:  Leg swelling.  Missed dialysis. EXAM: PORTABLE CHEST 1 VIEW COMPARISON:  04/03/2019 FINDINGS: Right IJ approach hemodialysis catheter terminates at the level of the superior cavoatrial junction. Partially visualized left axillary and proximal left upper extremity stent. The heart size and mediastinal contours are within normal limits. No focal airspace consolidation, pleural effusion, or pneumothorax. The visualized skeletal structures are unremarkable. IMPRESSION: No acute cardiopulmonary findings. Electronically Signed   By: Davina Poke D.O.   On: 01/22/2020 08:19    Impression: Anemia and heme positive stools:  -Hemoglobin 8.7, decreased from 14.3 as of 09/2019 -No frank melena or hematochezia -No prior EGD/colonoscopy  Diarrhea x3 days  ESRD on HD: BUN 101/creatinine 17.80  Hyperkalemia: Potassium 7.5 today  Plan: Initiate Protonix 40 mg IV twice daily.  C. difficile/GI pathogen panel due to acute diarrhea.  Continue to monitor H&H with transfusion as needed to maintain hemoglobin greater than 7.  Recommend EGD/colonoscopy at some point in time, though patient will need hemodialysis and correction of hyperkalemia prior to procedures.  Patient is currently stable, so it is possible that the procedures could be completed on an outpatient basis as well.   LOS: 0 days   Salley Slaughter  PA-C 01/22/2020, 8:50 AM  Contact #  [phone]

## 2020-01-22 NOTE — ED Provider Notes (Addendum)
[place] EMERGENCY DEPARTMENT Provider Note   CSN: [id number] Arrival date & time:        History Chief Complaint  Patient presents with  . Diarrhea  . Leg Swelling    Mary Wilcox is a 64 y.o. female.  Mild diarrhea, leg swelling, missed 1 week of dialysis, says neuropathies in her feet are too painful for her to get up and go to dialysis.  She says it is because she lives on the second floor cannot go down the stairs.  She is brought here today by EMS, she is also dealing with diarrhea but is able to tolerate liquids.  Nonbloody diarrhea.  Denies significant abdominal pain denies fevers denies sick contacts.  Normal work of breathing no chest pain        Past Medical History:  Diagnosis Date  . Bilateral hydronephrosis 2006   due to obstruction from cervical cancer  . Cataract    right eye  . Cervical cancer ([place]) 2006   IIIB s/p ext radiation and intracavitary cessium  . Chronic kidney disease    hx several episodes of ARF secondary to obstructive uropathy  . CKD (chronic kidney disease)    [facility]  . Depression   . Hypothyroidism   . Insomnia    takes Ambien 3x wk  . Iron deficiency anemia   . Peripheral vascular disease ([place])   . Secondary hyperparathyroidism (of renal origin)   . Transfusion history    02/2011 for Hgb 6.8  . Wears glasses     Patient Active Problem List   Diagnosis Date Noted  . Pressure injury of skin 04/04/2019  . Generalized weakness 02/11/2019  . Hyponatremia 02/11/2019  . Hyperkalemia 09/28/2018  . Bradycardia   . Mechanical complication of other vascular device, implant, and graft 08/19/2011  . ESRD (end stage renal disease) ([place]) 08/05/2011  . Other complications due to renal dialysis device, implant, and graft 08/05/2011  . Secondary hyperparathyroidism (of renal origin)   . Anemia associated with chronic renal failure 03/06/2011  . Thrombocytosis 03/06/2011  . Chronic kidney disease,  stage V requiring chronic dialysis ([place]) 03/05/2011  . High anion gap metabolic acidosis 19/62/2297  . Hydronephrosis, bilateral 03/05/2011  . Chronic UTI 03/05/2011  . Leukocytosis 03/05/2011    Past Surgical History:  Procedure Laterality Date  . AV FISTULA PLACEMENT  03/05/2011   Procedure: INSERTION OF ARTERIOVENOUS (AV) GORE-TEX GRAFT ARM;  Surgeon: Angelia Mould, MD;  Location: [facility];  Service: Vascular;  Laterality: Left;  start 1115   finish 1250  . INSERTION OF DIALYSIS CATHETER  03/05/2011   Procedure: INSERTION OF DIALYSIS CATHETER;  Surgeon: Angelia Mould, MD;  Location: [facility];  Service: Vascular;  Laterality: Right;  start 1041- finish 1051  . IR FLUORO GUIDE CV LINE RIGHT  05/24/2019  . TUBAL LIGATION    . UPPER EXTREMITY VENOGRAPHY Bilateral 09/25/2019   Procedure: UPPER EXTREMITY VENOGRAPHY;  Surgeon: Waynetta Sandy, MD;  Location: [facility];  Service: Cardiovascular;  Laterality: Bilateral;  Bilateral   . URETER SURGERY     stent, Dr. Risa Grill for bilateral hydro     OB History   No obstetric history on file.     Family History  Problem Relation Age of Onset  . Lung cancer Father   . Cancer Father   . Cancer Mother        kidney and thyroid  . Heart disease Brother   .  Diabetes Brother   . Hyperlipidemia Brother   . Hypertension Brother   . Other Brother        varicose vein    Social History   Tobacco Use  . Smoking status: Never Smoker  . Smokeless tobacco: Never Used  Vaping Use  . Vaping Use: Never used  Substance Use Topics  . Alcohol use: Yes    Comment: rare glass of wine  . Drug use: No    Home Medications Prior to Admission medications   Medication Sig Start Date End Date Taking? Authorizing Provider  acetaminophen (TYLENOL) 500 MG tablet Take 1,000 mg by mouth every 8 (eight) hours as needed for moderate pain or headache.     [provider]  b complex vitamins tablet Take 1 tablet by mouth  daily.    [provider]  calcium acetate (PHOSLO) 667 MG capsule Take 667 mg by mouth See admin instructions. Take 667 mg with meals and 667 mg with snacks 06/18/11   [provider]  cinacalcet (SENSIPAR) 90 MG tablet Take 90 mg by mouth at bedtime.     [provider]  cyanocobalamin 2000 MCG tablet Take 2,000 mcg by mouth daily.    [provider]  diclofenac Sodium (VOLTAREN) 1 % GEL Apply 4 g topically 4 (four) times daily. Patient not taking: Reported on 09/20/2019 08/22/19   Recardo Evangelist, PA-C  diphenhydrAMINE (BENADRYL) 25 MG tablet Take 25 mg by mouth daily as needed for allergies.    [provider]  ferric citrate (AURYXIA) 1 GM 210 MG(Fe) tablet Take 210-420 mg by mouth See admin instructions. Take 420mg  (2 tabs) three times daily with meals and 210 mg daily with snacks    [provider]  gabapentin (NEURONTIN) 100 MG capsule Take 100 mg by mouth in the morning and at bedtime.    [provider]  HYDROcodone-acetaminophen (NORCO/VICODIN) 5-325 MG tablet Take 1 tablet by mouth every 4 (four) hours as needed. Patient not taking: Reported on 09/20/2019 08/22/19   Recardo Evangelist, PA-C  midodrine (PROAMATINE) 10 MG tablet Take 10 mg by mouth See admin instructions. Take 10 mg by mouth twice on dialysis days (Tuesday, Thursday, Saturday) at 4am and 8am 09/11/18   [provider]  PROVENTIL HFA 108 (90 Base) MCG/ACT inhaler Inhale 2 puffs into the lungs every 6 (six) hours as needed for wheezing or shortness of breath.  04/11/19   [provider]  sertraline (ZOLOFT) 100 MG tablet Take 150 mg by mouth daily.  09/17/18   [provider]  sodium zirconium cyclosilicate (LOKELMA) 10 g PACK packet Take 10 g by mouth daily as needed (high potassium).    [provider]  zolpidem (AMBIEN) 10 MG tablet Take 10 mg by mouth at bedtime as needed for sleep.     [provider]    Allergies     Prozac [fluoxetine hcl]  Review of Systems   Review of Systems  Constitutional: Negative for chills and fever.  HENT: Negative for congestion and rhinorrhea.   Respiratory: Negative for cough and shortness of breath.   Cardiovascular: Positive for leg swelling. Negative for chest pain and palpitations.  Gastrointestinal: Positive for diarrhea. Negative for nausea and vomiting.  Genitourinary: Negative for difficulty urinating and dysuria.  Musculoskeletal: Negative for arthralgias and back pain.  Skin: Negative for rash and wound.  Neurological: Negative for light-headedness and headaches.    Physical Exam Updated Vital Signs BP (!) 177/66  Pulse 77   Temp 97.6 F (36.4 C) (Oral)   Resp 19   Ht 4\' 11"  (1.499 m)   Wt 80 kg   SpO2 99%   BMI 35.62 kg/m   Physical Exam Vitals and nursing note reviewed. Exam conducted with a chaperone present.  Constitutional:      General: She is not in acute distress.    Appearance: Normal appearance.  HENT:     Head: Normocephalic and atraumatic.     Nose: No rhinorrhea.  Eyes:     General:        Right eye: No discharge.        Left eye: No discharge.     Conjunctiva/sclera: Conjunctivae normal.  Cardiovascular:     Rate and Rhythm: Normal rate and regular rhythm.  Pulmonary:     Effort: Pulmonary effort is normal. No respiratory distress.     Breath sounds: No stridor. No rales.  Abdominal:     General: Abdomen is flat. There is no distension.     Palpations: Abdomen is soft.     Tenderness: There is no abdominal tenderness.  Musculoskeletal:        General: No tenderness or signs of injury.     Right lower leg: Edema present.     Left lower leg: Edema present.  Skin:    General: Skin is warm and dry.     Comments: Bed bugs crawling on legs  Neurological:     General: No focal deficit present.     Mental Status: She is alert. Mental status is at baseline.     Motor: No weakness.  Psychiatric:        Mood and Affect:  Mood normal.        Behavior: Behavior normal.     ED Results / Procedures / Treatments   Labs (all labs ordered are listed, but only abnormal results are displayed) Labs Reviewed  CBC  BASIC METABOLIC PANEL    EKG EKG Interpretation  Date/Time:  Monday January 22 2020 06:35:13 EST Ventricular Rate:  75 PR Interval:    QRS Duration: 88 QT Interval:  404 QTC Calculation: 452 R Axis:   -11 Text Interpretation: Sinus rhythm Confirmed by Dewaine Conger [phone]) on 01/22/2020 6:46:36 AM   Radiology No results found.  Procedures Procedures (including critical care time)  Medications Ordered in ED Medications - No data to display  ED Course  I have reviewed the triage vital signs and the nursing notes.  Pertinent labs & imaging results that were available during my care of the patient were reviewed by me and considered in my medical decision making (see chart for details).    MDM Rules/Calculators/A&P                          Patient is overall well-appearing but has leg swelling, likely secondary to missed dialysis.  No shortness of breath no chest pain.  Cardiopulmonary exam unremarkable.  Abdomen soft, dealing with diarrhea but able to tolerate p.o. well.  Will get electrolytes and will get an EKG.  If labs and EKG are stable she is safe to go home to get to outpatient dialysis tomorrow.  EKG reviewed by myself shows sinus rhythm without acute ischemic change interval abnormality or arrhythmia.  No signs of electrolyte imbalance on ECG  Patient did have a significant drop in hemoglobin compared to old labs, my rectal exam showed darker stool but not melena or  bright red blood.  She states the medicine she takes makes her stool black.  Only changes she has an increased frequency and looseness of her stools.  Pt care was handed off to on coming provider at 0700.  Complete history and physical and current plan have been communicated.  Please refer to their note for the remainder  of ED care and ultimate disposition.  Pt seen in conjunction with Dr. Jeanell Sparrow   Final Clinical Impression(s) / ED Diagnoses Final diagnoses:  Leg swelling    Rx / DC Orders ED Discharge Orders    None       Breck Coons, MD 01/22/20 [id number]    Breck Coons, MD 01/22/20 [phone]

## 2020-01-22 NOTE — ED Triage Notes (Addendum)
Per [facility] em[facility] , pt coming form home reporting missing dialysis for 1 week, normally goes T, th, saturdays. Pt also c/o diarrhea, and bilateral legs welling. Lungs clear denies sob, pt vss in route. 150/100, hr 80, spo2 100% on room air. pts has bed bugs, noted to be covered in feces

## 2020-01-22 NOTE — ED Provider Notes (Signed)
64 yo female esrd dialysis t, th, Saturday- has missed for 3 sessions due to pain in feet.  Diarrhea for 4 days darker than usual.  Hgb down to 7.8 from 04 October 2019 down to  Physical Exam  BP (!) 177/66   Pulse 77   Temp 97.6 F (36.4 C) (Oral)   Resp 19   Ht 1.499 m (4\' 11" )   Wt 80 kg   SpO2 100%   BMI 35.62 kg/m   Physical Exam  ED Course/Procedures     .Critical Care Performed by: Pattricia Boss, MD Authorized by: Pattricia Boss, MD   Critical care provider statement:    Critical care time (minutes):  45   Critical care end time:  01/22/2020 8:58 AM   Critical care was necessary to treat or prevent imminent or life-threatening deterioration of the following conditions:  Metabolic crisis and renal failure   Critical care was time spent personally by me on the following activities:  Discussions with consultants, evaluation of patient's response to treatment, examination of patient, ordering and performing treatments and interventions, ordering and review of laboratory studies, ordering and review of radiographic studies, pulse oximetry, re-evaluation of patient's condition, obtaining history from patient or surrogate and review of old charts    MDM    64 yo female esrd on dialysis, last dialyzed 9 days ago reports that she has not gone to dialysis due to depression, but denies si.  She has had difficulty going up and down steps for the past year and has required ems assistance to go to dialysis.  She reports that she can get around her apartment on her own ok.  She endorses increased stooling but has not noted blood.  She reports chronic leg swelling and pain without change from prior.   Plan admission PMD NP at [facility] reviewed- HGB 8.7- down from baseline of 11 although last 14.3 with diarrhea and heme positive stools 2-Dialysis missed x one week- patient does not appear volume overloaded but potassium >7.5 without ekg changes. Calcium and glucose and insulin  ordered.  3- pain in legs- swelling and right leg larger than left noted on exam.  Patient states unchanged from baseline and reports normal mobility in apartment, but nursing reports patient was covered in stool and bedbugs, so mobility questioned. 4- depression - patient reports depression exacerbated by health care challenges especially needing to call ems for transport up and down steps for every dialysis visit.  However, patient denies suicidality and opted to call EMS today for care.  Discussed with nephrology  GI IM'd that they will see Discussed with Dr. Lance Sell, MD 01/22/20 [phone]

## 2020-01-22 NOTE — H&P (Signed)
History and Physical    Mary Wilcox CLE:[id number] DOB: [birth year] DOA: 01/22/2020  PCP: Kristen Loader, FNP Consultants:  Donzetta Matters - vascular; Moshe Cipro - nephrology Patient coming from:  Home; NOK: Daughter, [name]  Chief Complaint: Missed HD  HPI: Mary Wilcox is a 64 y.o. female with medical history significant of PVD; ESRD on HD; hypothyroidism; and remote cervical cancer s/p external radiation and intracavitary cesium presenting with missed HD.  She reports that she lives in an upstairs apartment and uses a walker to ambulate within it or a wheelchair when leaving the apartment.  She calls EMS to transport her to HD.  She has grown weary of this and so stopped going to HD.  She last attended on 10/30.  She has multiple stools per day at baseline and has had more stools, up to 3-5 per day.  She presented covered in feces and bed bugs.   ED Course:  Patient reports she is fine walking around her apartment - but covered in feces and beg bugs.  She got tired of calling EMS to transport her to HD because unable to go up and down stairs - so stopped going to HD.  Chronic loose stools, mildly worse and darker - heme positive and Hgb has dropped (12 to 8.5).  K+ >7.5, EKG ok.  Given calcium, insulin, glucose.  When COVID test is back, off to HD.  Chronic leg pain and R >L swelling.   Review of Systems: As per HPI; otherwise review of systems reviewed and negative.   Ambulatory Status:  Ambulates with a walker  COVID Vaccine Status:  Complete  Past Medical History:  Diagnosis Date  . Bilateral hydronephrosis 2006   due to obstruction from cervical cancer  . Cataract    right eye  . Cervical cancer ([place]) 2006   IIIB s/p ext radiation and intracavitary cessium  . Chronic kidney disease    hx several episodes of ARF secondary to obstructive uropathy  . Depression   . ESRD on hemodialysis ([place])    [facility], TTS HD  . Hypothyroidism   . Insomnia    takes Ambien  3x wk  . Iron deficiency anemia   . Peripheral vascular disease ([place])   . Secondary hyperparathyroidism (of renal origin)   . Transfusion history    02/2011 for Hgb 6.8  . Wears glasses     Past Surgical History:  Procedure Laterality Date  . AV FISTULA PLACEMENT  03/05/2011   Procedure: INSERTION OF ARTERIOVENOUS (AV) GORE-TEX GRAFT ARM;  Surgeon: Angelia Mould, MD;  Location: [facility];  Service: Vascular;  Laterality: Left;  start 1115   finish 1250  . INSERTION OF DIALYSIS CATHETER  03/05/2011   Procedure: INSERTION OF DIALYSIS CATHETER;  Surgeon: Angelia Mould, MD;  Location: [facility];  Service: Vascular;  Laterality: Right;  start 1041- finish 1051  . IR FLUORO GUIDE CV LINE RIGHT  05/24/2019  . TUBAL LIGATION    . UPPER EXTREMITY VENOGRAPHY Bilateral 09/25/2019   Procedure: UPPER EXTREMITY VENOGRAPHY;  Surgeon: Waynetta Sandy, MD;  Location: [place] [facility];  Service: Cardiovascular;  Laterality: Bilateral;  Bilateral   . URETER SURGERY     stent, Dr. Risa Grill for bilateral hydro    Social History   Socioeconomic History  . Marital status: Divorced    Spouse name: Not on file  . Number of children: Not on file  . Years of education: Not on file  .  Highest education level: Not on file  Occupational History  . Not on file  Tobacco Use  . Smoking status: Never Smoker  . Smokeless tobacco: Never Used  Vaping Use  . Vaping Use: Never used  Substance and Sexual Activity  . Alcohol use: Yes    Comment: rare glass of wine  . Drug use: No  . Sexual activity: Never    Birth control/protection: Surgical, Post-menopausal  Other Topics Concern  . Not on file  Social History Narrative  . Not on file   Social Determinants of Health   Financial Resource Strain:   . Difficulty of Paying Living Expenses: Not on file  Food Insecurity:   . Worried About Charity fundraiser in the Last Year: Not on file  . Ran Out of Food in the Last Year: Not on file   Transportation Needs:   . Lack of Transportation (Medical): Not on file  . Lack of Transportation (Non-Medical): Not on file  Physical Activity:   . Days of Exercise per Week: Not on file  . Minutes of Exercise per Session: Not on file  Stress:   . Feeling of Stress : Not on file  Social Connections:   . Frequency of Communication with Friends and Family: Not on file  . Frequency of Social Gatherings with Friends and Family: Not on file  . Attends Religious Services: Not on file  . Active Member of Clubs or Organizations: Not on file  . Attends Archivist Meetings: Not on file  . Marital Status: Not on file  Intimate Partner Violence:   . Fear of Current or Ex-Partner: Not on file  . Emotionally Abused: Not on file  . Physically Abused: Not on file  . Sexually Abused: Not on file    Allergies  Allergen Reactions  . Prozac [Fluoxetine Hcl] Hives    Family History  Problem Relation Age of Onset  . Lung cancer Father   . Cancer Father   . Cancer Mother        kidney and thyroid  . Heart disease Brother   . Diabetes Brother   . Hyperlipidemia Brother   . Hypertension Brother   . Other Brother        varicose vein    Prior to Admission medications   Medication Sig Start Date End Date Taking? Authorizing Provider  acetaminophen (TYLENOL) 500 MG tablet Take 1,000 mg by mouth every 8 (eight) hours as needed for moderate pain or headache.     [provider]  b complex vitamins tablet Take 1 tablet by mouth daily.    [provider]  calcium acetate (PHOSLO) 667 MG capsule Take 667 mg by mouth See admin instructions. Take 667 mg with meals and 667 mg with snacks 06/18/11   [provider]  cinacalcet (SENSIPAR) 90 MG tablet Take 90 mg by mouth at bedtime.     [provider]  cyanocobalamin 2000 MCG tablet Take 2,000 mcg by mouth daily.    [provider]  diclofenac Sodium (VOLTAREN) 1 % GEL Apply 4 g topically 4 (four)  times daily. Patient not taking: Reported on 09/20/2019 08/22/19   Recardo Evangelist, PA-C  diphenhydrAMINE (BENADRYL) 25 MG tablet Take 25 mg by mouth daily as needed for allergies.    [provider]  ferric citrate (AURYXIA) 1 GM 210 MG(Fe) tablet Take 210-420 mg by mouth See admin instructions. Take 420mg  (2 tabs) three times daily with meals and 210 mg  daily with snacks    [provider]  gabapentin (NEURONTIN) 100 MG capsule Take 100 mg by mouth in the morning and at bedtime.    [provider]  HYDROcodone-acetaminophen (NORCO/VICODIN) 5-325 MG tablet Take 1 tablet by mouth every 4 (four) hours as needed. Patient not taking: Reported on 09/20/2019 08/22/19   Recardo Evangelist, PA-C  midodrine (PROAMATINE) 10 MG tablet Take 10 mg by mouth See admin instructions. Take 10 mg by mouth twice on dialysis days (Tuesday, Thursday, Saturday) at 4am and 8am 09/11/18   [provider]  PROVENTIL HFA 108 (90 Base) MCG/ACT inhaler Inhale 2 puffs into the lungs every 6 (six) hours as needed for wheezing or shortness of breath.  04/11/19   [provider]  sertraline (ZOLOFT) 100 MG tablet Take 150 mg by mouth daily.  09/17/18   [provider]  sodium zirconium cyclosilicate (LOKELMA) 10 g PACK packet Take 10 g by mouth daily as needed (high potassium).    [provider]  zolpidem (AMBIEN) 10 MG tablet Take 10 mg by mouth at bedtime as needed for sleep.     [provider]    Physical Exam: Vitals:   01/22/20 1645 01/22/20 1700 01/22/20 1715 01/22/20 1730  BP: 140/74 (!) 159/77 (!) 161/56 (!) 171/60  Pulse: 93 88    Resp: 18 19 15 17   Temp:      TempSrc:      SpO2: 100% 100%    Weight:      Height:         . General:  Appears calm and comfortable and is NAD . Eyes:  EOMI, normal lids, iris . ENT:  grossly normal hearing, lips & tongue, mmm . Neck:  no LAD, masses or thyromegaly . Cardiovascular:  RRR, no m/r/g. No LE edema.   Marland Kitchen Respiratory:   CTA bilaterally with no wheezes/rales/rhonchi.  Normal respiratory effort. . Abdomen:  soft, NT, ND, NABS . Skin:  no rash or induration seen on limited exam . Musculoskeletal:  grossly normal tone BUE/BLE, good ROM, no bony abnormality . Psychiatric:  grossly normal mood and affect, speech fluent and appropriate, AOx3 Neurologic:  CN 2-12 grossly intact, moves all extremities in coordinated fashion   Radiological Exams on Admission: Independently reviewed - see discussion in A/P where applicable  DG Chest Port 1 View  Result Date: 01/22/2020 CLINICAL DATA:  Leg swelling.  Missed dialysis. EXAM: PORTABLE CHEST 1 VIEW COMPARISON:  04/03/2019 FINDINGS: Right IJ approach hemodialysis catheter terminates at the level of the superior cavoatrial junction. Partially visualized left axillary and proximal left upper extremity stent. The heart size and mediastinal contours are within normal limits. No focal airspace consolidation, pleural effusion, or pneumothorax. The visualized skeletal structures are unremarkable. IMPRESSION: No acute cardiopulmonary findings. Electronically Signed   By: Davina Poke D.O.   On: 01/22/2020 08:19    EKG: Independently reviewed.  NSR with rate 75; mildly peaked T waves  Labs on Admission: I have personally reviewed the available labs and imaging studies at the time of the admission.  Pertinent labs:   K+ >7.5 CO2 14 BN 101/Creatinine 17.80/GFR 2 WBC 13.3 Hgb 8.7; 11.6 on 1/21 Heme positive   Assessment/Plan Principal Problem:   Hyperkalemia, diminished renal excretion Active Problems:   Chronic kidney disease, stage V requiring chronic dialysis (HCC)   Hypothyroidism   High risk social situation   Hyperkalemia -Patient with acute/subacute hyperkalemia which appears to be resulting from lack of HD -Was  given calcium gluconate and insulin/glucose in ER as well as Lokelma -For HD today  ESRD on HD  -Patient on chronic TTS  HD -Nephrology prn order set utilized -She needs acute HD and nephrology is aware and arranging -She is likely to need serial HD -HD compliance reviewed and this appears to be related to social issues  High risk social situation -Patient lives in an upper floor apartment and cannot navigate the stairs -She also was covered in stool and bed bugs upon arrival -[facility] team consult for assistance  Hypothyroidism -Check TSH -Continue Synthroid at current dose for now  Anemia -Patient with anemia associated with CKD but appears to be worse than baseline -Has had mildly increased stools -Heme positive stools today -May need further GI evaluation; consult pending -Will follow daily CBC and monitor for symptoms post-HD  Obesity Body mass index is 35.62 kg/m. -Weight loss should be encouraged -Outpatient PCP/bariatric medicine f/u encouraged   Note: This patient has been tested and is negative for the novel coronavirus COVID-19. The patient has been fully vaccinated against COVID-19.    DVT prophylaxis: SCDs Code Status:  Full - confirmed with patient Family Communication: None present Disposition Plan:  The patient is from: home  Anticipated d/c is to: to be determined  Anticipated d/c date will depend on clinical response to treatment, but likely 2-3 days  Patient is currently: acutely ill Consults called: Nephrology; GI Admission status:  Admit - It is my clinical opinion that admission to INPATIENT is reasonable and necessary because of the expectation that this patient will require hospital care that crosses at least 2 midnights to treat this condition based on the medical complexity of the problems presented.  Given the aforementioned information, the predictability of an adverse outcome is felt to be significant.    Karmen Bongo MD [facility]   How to contact the [facility] Attending or Consulting provider [place] or covering provider during after hours [place], for this  patient?  1. Check the care team in [facility] and look for a) attending/consulting TRH provider listed and b) the [facility] team listed 2. Log into [email or web address] and use [place]'s universal password to access. If you do not have the password, please contact the hospital operator. 3. Locate the [facility] provider you are looking for under [facility] and page to a number that you can be directly reached. 4. If you still have difficulty reaching the provider, please page the [facility] (Director on Call) for the Hospitalists listed on amion for assistance.   01/22/2020, 5:59 PM

## 2020-01-22 NOTE — Consult Note (Signed)
[place]  Reason for Consultation: hyperkalemia, ESRD, missed HD Requesting Provider: Dr. Jeanell Sparrow, [facility] ED  HPI: Mary Wilcox is an 64 y.o. female with ESRD on HD since 2013, frequent missed HD, h/o cervical Ca (2006), hypothyroidism, depression who is seen for evaluation and management of severe hyperkalemia in the setting of missed dialysis.  Presented to [facility] ED today via EMS c/o diarrhea, swelling - per notes she was covered in feces and bedbugs.  She stated she couldn't get down the stairs of her 2nd floor apt to go to dialysis - per review of records it appears she's been going to 1 dialysis per week lately.  K found to be >  7.5 without acute EKG changes per ED provider.  Being medically managed currently.  FOBT+ and being eval for GIB.   She c/o weakness, LE edema, DOE but not orthopnea.  Has [facility] for access. No pain at site.  No f/c.   PMH: Past Medical History:  Diagnosis Date   Bilateral hydronephrosis 2006   due to obstruction from cervical cancer   Cataract    right eye   Cervical cancer ([place]) 2006   IIIB s/p ext radiation and intracavitary cessium   Chronic kidney disease    hx several episodes of ARF secondary to obstructive uropathy   CKD (chronic kidney disease)    [facility]   Depression    Hypothyroidism    Insomnia    takes Ambien 3x wk   Iron deficiency anemia    Peripheral vascular disease ([place])    Secondary hyperparathyroidism (of renal origin)    Transfusion history    02/2011 for Hgb 6.8   Wears glasses    PSH: Past Surgical History:  Procedure Laterality Date   AV FISTULA PLACEMENT  03/05/2011   Procedure: INSERTION OF ARTERIOVENOUS (AV) GORE-TEX GRAFT ARM;  Surgeon: Angelia Mould, MD;  Location: [facility];  Service: Vascular;  Laterality: Left;  start 1115   finish [place]  03/05/2011   Procedure: INSERTION OF DIALYSIS CATHETER;  Surgeon: Angelia Mould, MD;   Location: [facility];  Service: Vascular;  Laterality: Right;  start 1041- finish 1051   IR FLUORO GUIDE CV LINE RIGHT  05/24/2019   TUBAL LIGATION     UPPER EXTREMITY VENOGRAPHY Bilateral 09/25/2019   Procedure: UPPER EXTREMITY VENOGRAPHY;  Surgeon: Waynetta Sandy, MD;  Location: [place] [facility];  Service: Cardiovascular;  Laterality: Bilateral;  Bilateral    URETER SURGERY     stent, Dr. Risa Grill for bilateral hydro    Past Medical History:  Diagnosis Date   Bilateral hydronephrosis 2006   due to obstruction from cervical cancer   Cataract    right eye   Cervical cancer ([place]) 2006   IIIB s/p ext radiation and intracavitary cessium   Chronic kidney disease    hx several episodes of ARF secondary to obstructive uropathy   CKD (chronic kidney disease)    [facility]   Depression    Hypothyroidism    Insomnia    takes Ambien 3x wk   Iron deficiency anemia    Peripheral vascular disease ([place])    Secondary hyperparathyroidism (of renal origin)    Transfusion history    02/2011 for Hgb 6.8   Wears glasses     Medications:  I have reviewed the patient's current medications.  (Not in a hospital admission)     Allergies  Allergen  Reactions   Prozac [Fluoxetine Hcl] Hives    FAM HX: Family History  Problem Relation Age of Onset   Lung cancer Father    Cancer Father    Cancer Mother        kidney and thyroid   Heart disease Brother    Diabetes Brother    Hyperlipidemia Brother    Hypertension Brother    Other Brother        varicose vein    Social History:   reports that she has never smoked. She has never used smokeless tobacco. She reports current alcohol use. She reports that she does not use drugs.  ROS: 12 system ROS per HPI above.  Blood pressure (!) 177/66, pulse 77, temperature 97.6 F (36.4 C), temperature source Oral, resp. rate 19, height 4\' 11"  (1.499 m), weight 80 kg, SpO2 100 %. PHYSICAL EXAM: Gen: chronically ill appearing but  nontoxic  Eyes: anicteric + glasses ENT: MMM Neck: + JVD to mandible at 30 deg CV:  RRR Abd:  soft, nontender GU: no foley Extr: 2+ pitting edema Neuro: dec strength in BL LE Skin: RIJ TDC c/d/I, some minor erythema and scaling of the skin on the feet - doesn't look like cellulitis   Results for orders placed or performed during the hospital encounter of 01/22/20 (from the past 48 hour(s))  CBC     Status: Abnormal   Collection Time: 01/22/20  6:50 AM  Result Value Ref Range   WBC 13.3 (H) 4.0 - 10.5 K/uL   RBC 2.82 (L) 3.87 - 5.11 MIL/uL   Hemoglobin 8.7 (L) 12.0 - 15.0 g/dL   HCT 28.7 (L) 36 - 46 %   MCV 101.8 (H) 80.0 - 100.0 fL   MCH 30.9 26.0 - 34.0 pg   MCHC 30.3 30.0 - 36.0 g/dL   RDW 15.6 (H) 11.5 - 15.5 %   Platelets 302 150 - 400 K/uL   nRBC 0.0 0.0 - 0.2 %    Comment: Performed at [facility], [address], [place], [place] [address]  Basic metabolic panel     Status: Abnormal   Collection Time: 01/22/20  6:50 AM  Result Value Ref Range   Sodium 135 135 - 145 mmol/L   Potassium >7.5 (HH) 3.5 - 5.1 mmol/L    Comment: CRITICAL RESULT CALLED TO, READ BACK BY AND VERIFIED WITH: V.GLOSSON,RN 0739 01/22/20 CLARK,S    Chloride 96 (L) 98 - 111 mmol/L   CO2 14 (L) 22 - 32 mmol/L   Glucose, Bld 87 70 - 99 mg/dL    Comment: Glucose reference range applies only to samples taken after fasting for at least 8 hours.   BUN 101 (H) 8 - 23 mg/dL   Creatinine, Ser 17.80 (H) 0.44 - 1.00 mg/dL   Calcium 8.6 (L) 8.9 - 10.3 mg/dL   GFR, Estimated 2 (L) >60 mL/min    Comment: (NOTE) Calculated using the CKD-EPI Creatinine Equation (2021)    Anion gap 25 (H) 5 - 15    Comment: Performed at [facility] [address], [place], [place] [address]  POC occult blood, ED     Status: Abnormal   Collection Time: 01/22/20  7:26 AM  Result Value Ref Range   Fecal Occult Bld POSITIVE (A) NEGATIVE    DG Chest Port 1 View  Result Date: 01/22/2020 CLINICAL DATA:  Leg  swelling.  Missed dialysis. EXAM: PORTABLE CHEST 1 VIEW COMPARISON:  04/03/2019 FINDINGS: Right IJ approach  hemodialysis catheter terminates at the level of the superior cavoatrial junction. Partially visualized left axillary and proximal left upper extremity stent. The heart size and mediastinal contours are within normal limits. No focal airspace consolidation, pleural effusion, or pneumothorax. The visualized skeletal structures are unremarkable. IMPRESSION: No acute cardiopulmonary findings. Electronically Signed   By: Davina Poke D.O.   On: 01/22/2020 08:19   HD orders:  TTS [place], 3:45h, 160 NR, 400/800, 1k/2.5Ca, EDW 82kg CVC Mircera 100 q2wks , venofer 50 weekly Last HD 10/30 - then 10/23, 10/16. Post wt 81.6kg.   Assessment/Plan **Hyperkalemia:  Temporized by ED.  Generally running on 1K outpt.  1K x 1 hr then 2K.  Low K diet.  Lokelma 10 daily here for now.   **ESRD on HD:   HD today x 2.5h to fix K, run short give missed HD and risk for disequilibrium.  HD again tomorrow to resume TTS schedule. Look into alt living situations as she says she can't make it down from 2nd story apt to go to HD.    **Anemia:  possible GIB:  Hb 10/30 at HD 8.8, today 8.7 but with +hemoccult stool.  GI w/u per primary. Last ESA outpt 10/9 - will give aranesp here.   **Secondary hyperPTH: 10/23 outpt PTH 339, phos 6.4, Ca 7.8.  Not on VDRA. Cont [place].  **HTN:  Not on antiHTN outpt.  Monitor with U[facility].  Midodrine actually on med list.    Justin Mend 01/22/2020, 8:32 AM

## 2020-01-22 NOTE — ED Notes (Addendum)
Pt covered in feces and bed bugs, taken to decon, provider made aware of delayed care. Pt vss, and in nadn

## 2020-01-23 DIAGNOSIS — E875 Hyperkalemia: Secondary | ICD-10-CM | POA: Diagnosis not present

## 2020-01-23 LAB — IRON AND TIBC
Iron: 56 ug/dL (ref 28–170)
Saturation Ratios: 36 % — ABNORMAL HIGH (ref 10.4–31.8)
TIBC: 154 ug/dL — ABNORMAL LOW (ref 250–450)
UIBC: 98 ug/dL

## 2020-01-23 LAB — CBC
HCT: 24.1 % — ABNORMAL LOW (ref 36.0–46.0)
Hemoglobin: 7.4 g/dL — ABNORMAL LOW (ref 12.0–15.0)
MCH: 30.5 pg (ref 26.0–34.0)
MCHC: 30.7 g/dL (ref 30.0–36.0)
MCV: 99.2 fL (ref 80.0–100.0)
Platelets: 221 10*3/uL (ref 150–400)
RBC: 2.43 MIL/uL — ABNORMAL LOW (ref 3.87–5.11)
RDW: 15.5 % (ref 11.5–15.5)
WBC: 8.5 10*3/uL (ref 4.0–10.5)
nRBC: 0 % (ref 0.0–0.2)

## 2020-01-23 LAB — FERRITIN: Ferritin: 527 ng/mL — ABNORMAL HIGH (ref 11–307)

## 2020-01-23 LAB — RETICULOCYTES
Immature Retic Fract: 17.7 % — ABNORMAL HIGH (ref 2.3–15.9)
RBC.: 2.16 MIL/uL — ABNORMAL LOW (ref 3.87–5.11)
Retic Count, Absolute: 42.8 10*3/uL (ref 19.0–186.0)
Retic Ct Pct: 2 % (ref 0.4–3.1)

## 2020-01-23 LAB — FOLATE: Folate: 4.7 ng/mL — ABNORMAL LOW (ref >5.9)

## 2020-01-23 LAB — BASIC METABOLIC PANEL
Anion gap: 15 (ref 5–15)
BUN: 40 mg/dL — ABNORMAL HIGH (ref 8–23)
CO2: 23 mmol/L (ref 22–32)
Calcium: 8.9 mg/dL (ref 8.9–10.3)
Chloride: 98 mmol/L (ref 98–111)
Creatinine, Ser: 10.36 mg/dL — ABNORMAL HIGH (ref 0.44–1.00)
GFR, Estimated: 4 mL/min — ABNORMAL LOW (ref >60)
Glucose, Bld: 78 mg/dL (ref 70–99)
Potassium: 4.9 mmol/L (ref 3.5–5.1)
Sodium: 136 mmol/L (ref 135–145)

## 2020-01-23 LAB — VITAMIN B12: Vitamin B-12: 4639 pg/mL — ABNORMAL HIGH (ref 180–914)

## 2020-01-23 LAB — GLUCOSE, CAPILLARY: Glucose-Capillary: 69 mg/dL — ABNORMAL LOW (ref 70–99)

## 2020-01-23 MED ORDER — LORATADINE 10 MG PO TABS
10.0000 mg | ORAL_TABLET | Freq: Every day | ORAL | Status: DC
  Administered 2020-01-23 – 2020-01-30 (×8): 10 mg via ORAL
  Filled 2020-01-23 (×8): qty 1

## 2020-01-23 MED ORDER — FOLIC ACID 1 MG PO TABS
1.0000 mg | ORAL_TABLET | Freq: Every day | ORAL | Status: DC
  Administered 2020-01-23 – 2020-01-30 (×8): 1 mg via ORAL
  Filled 2020-01-23 (×8): qty 1

## 2020-01-23 MED ORDER — DARBEPOETIN ALFA 100 MCG/0.5ML IJ SOSY: 100.0000 ug | PREFILLED_SYRINGE | INTRAMUSCULAR | Status: DC

## 2020-01-23 MED ORDER — FLUTICASONE PROPIONATE 50 MCG/ACT NA SUSP
1.0000 | Freq: Every day | NASAL | Status: DC
  Administered 2020-01-23 – 2020-01-30 (×5): 1 via NASAL
  Filled 2020-01-23 (×2): qty 16

## 2020-01-23 MED ORDER — LORATADINE 10 MG PO TABS: 10.0000 mg | ORAL_TABLET | Freq: Every day | ORAL | Status: DC | PRN

## 2020-01-23 MED ORDER — NYSTATIN 100000 UNIT/GM EX POWD
1.0000 "application " | Freq: Two times a day (BID) | CUTANEOUS | Status: DC
  Administered 2020-01-23 – 2020-01-30 (×14): 1 via TOPICAL
  Filled 2020-01-23 (×2): qty 15

## 2020-01-23 NOTE — Progress Notes (Signed)
Pt admitted to 5MW-09, bought via bed from ED after dialysis session. Pt is A&Ox4 w/ no c/o pain or discomfort. Pt has +3 pitting edema in BLE. Two stage II pressure injuries were noted on bilateral intergluteal cleft along with generalized MASD in perineal area. Pt denies incontinence, but has been having frequent liquid BMs. C. Diff is pending collection but pt has not had BM since admission to unit. Pt educated on call bell use and fall risk prevention strategies. Pt verbalized understanding. Plan for dialysis today. Will continue to monitor.

## 2020-01-23 NOTE — Progress Notes (Signed)
PROGRESS NOTE    Mary Wilcox  VZC:[id number] DOB: 03/31/1955 DOA: 01/22/2020 PCP: Kristen Loader, FNP   Brief Narrative: 64 year old with past medical history significant for PVD, ESRD on hemodialysis, hypothyroidism, remote cervical cancer status post external radiation and intracavitary cesium presents after missing hemodialysis.  Patient lives in an upstairs apartment and uses a walker to ambulate and she also use a wheelchair when she leaving the apartment.  She has called EMS to be transported to hemodialysis.  She has been Weak and unable to ambulate for this reason she stopped going to hemodialysis.  He presented covered in feces and bedbugs.  Evaluation in the ED: She is not able to go to her outpatient hemodialysis because she is not able to go up and down the stairs.  She has worsening of her chronic loose stool.  Hemoglobin has dropped to 8.5 from 12.  Patient with a potassium of 7.5.  Received calcium, insulin and glucose in the ED.    Assessment & Plan:   Principal Problem:   Hyperkalemia, diminished renal excretion Active Problems:   Chronic kidney disease, stage V requiring chronic dialysis ([place])   Hypothyroidism   High risk social situation   Anemia   Class 2 obesity due to excess calories with body mass index (BMI) of 35.0 to 35.9 in adult   1-Hyperkalemia: Patient received calcium gluconate, insulin and glucose and Lokelma. Corrected with hemodialysis.  2-ESRD on hemodialysis: Dialysis TTS Nephrology consulted and following. Patient had hemodialysis on admission and again today Hemodialysis compliance related to social issues.  She might need placement  3-Social situation: Patient lives in an upper floor apartment and cannot navigate the stairs. Will need PT, probably will need skilled nursing facility.  4-Hypothyroidism: Continue with Synthroid Check TSH:  5-Anemia acute on chronic: Check anemia panel, J28 elevated, folic acid low.  Started folic  acid supplements. Iron panel consistent with anemia of chronic disease Monitor hemoglobin Hold B12 supplementation. Continue to monitor hemoglobin.  If hemoglobin continues to decrease might need GI eval  6-Acute on chronic diarrhea: C. difficile and GI pathogen ordered. Supportive care  Obesity; She will need weight loss Chronic hypotension: Continue with midodrine.   Pressure Injury 04/04/19 Buttocks Bilateral;Mid Stage 1 -  Intact skin with non-blanchable redness of a localized area usually over a bony prominence. (Active)  04/04/19 0039  Location: Buttocks  Location Orientation: Bilateral;Mid  Staging: Stage 1 -  Intact skin with non-blanchable redness of a localized area usually over a bony prominence.  Wound Description (Comments):   Present on Admission: Yes     Pressure Injury 01/23/20 Buttocks Right;Other (Comment) Stage 2 -  Partial thickness loss of dermis presenting as a shallow open injury with a red, pink wound bed without slough. (Active)  01/23/20 0000  Location: Buttocks  Location Orientation: Right;Other (Comment)  Staging: Stage 2 -  Partial thickness loss of dermis presenting as a shallow open injury with a red, pink wound bed without slough.  Wound Description (Comments):   Present on Admission: Yes     Pressure Injury 01/23/20 Buttocks Left Stage 2 -  Partial thickness loss of dermis presenting as a shallow open injury with a red, pink wound bed without slough. (Active)  01/23/20 0000  Location: Buttocks  Location Orientation: Left  Staging: Stage 2 -  Partial thickness loss of dermis presenting as a shallow open injury with a red, pink wound bed without slough.  Wound Description (Comments):   Present on Admission: Yes  Estimated body mass index is 35.62 kg/m as calculated from the following:   Height as of this encounter: 4\' 11"  (1.499 m).   Weight as of this encounter: 80 kg.   DVT prophylaxis: SCDs Code Status: Full code Family  Communication: Discussed with patient Disposition Plan:  Status is: Inpatient  Remains inpatient appropriate because:Ongoing diagnostic testing needed not appropriate for outpatient work up   Dispo: The patient is from: Home              Anticipated d/c is to: SNF              Anticipated d/c date is: 2 days              Patient currently is not medically stable to d/c.        Consultants:   Nephrology  Procedures:   Hemodialysis  Antimicrobials:    Subjective:   Objective: Vitals:   01/22/20 2300 01/22/20 2315 01/23/20 0023 01/23/20 0415  BP: (!) 156/61 (!) 165/59 (!) 147/95 133/65  Pulse: 78 86 80 79  Resp: (!) 0 20 16 18   Temp:   98.2 F (36.8 C) 97.8 F (36.6 C)  TempSrc:   Oral Oral  SpO2: 98% 98% 98% 100%  Weight:      Height:        Intake/Output Summary (Last 24 hours) at 01/23/2020 0742 Last data filed at 01/22/2020 1401 Gross per 24 hour  Intake 108.72 ml  Output 1219 ml  Net -1110.28 ml   Filed Weights   01/22/20 0517  Weight: 80 kg    Examination:  General exam: Appears calm and comfortable , obese  Respiratory system: Clear to auscultation. Respiratory effort normal. Cardiovascular system: S1 & S2 heard, RRR. No JVD, murmurs, rubs, gallops or clicks. No pedal edema. Gastrointestinal system: Abdomen is nondistended, soft and nontender. No organomegaly or masses felt. Normal bowel sounds heard. Central nervous system: Alert and oriented.  Alice,. Extremities: Symmetric 5 x 5 power.   Data Reviewed: I have personally reviewed following labs and imaging studies  CBC: Recent Labs  Lab 01/22/20 0650 01/22/20 1800 01/23/20 0222  WBC 13.3* 9.3 8.5  HGB 8.7* 7.6* 7.4*  HCT 28.7* 23.4* 24.1*  MCV 101.8* 101.3* 99.2  PLT 302 277 382   Basic Metabolic Panel: Recent Labs  Lab 01/22/20 0650 01/22/20 1139 01/23/20 0222  NA 135 136 136  K >7.5* 5.6* 4.9  CL 96* 97* 98  CO2 14* 19* 23  GLUCOSE 87 101* 78  BUN 101* 85* 40*    CREATININE 17.80* 15.27* 10.36*  CALCIUM 8.6* 8.7* 8.9  PHOS  --  7.3*  --    GFR: Estimated Creatinine Clearance: 4.9 mL/min (A) (by C-G formula based on SCr of 10.36 mg/dL (H)). Liver Function Tests: Recent Labs  Lab 01/22/20 1139  ALBUMIN 2.9*   No results for input(s): LIPASE, AMYLASE in the last 168 hours. No results for input(s): AMMONIA in the last 168 hours. Coagulation Profile: No results for input(s): INR, PROTIME in the last 168 hours. Cardiac Enzymes: No results for input(s): CKTOTAL, CKMB, CKMBINDEX, TROPONINI in the last 168 hours. BNP (last 3 results) No results for input(s): PROBNP in the last 8760 hours. HbA1C: No results for input(s): HGBA1C in the last 72 hours. CBG: Recent Labs  Lab 01/23/20 0026  GLUCAP 69*   Lipid Profile: No results for input(s): CHOL, HDL, LDLCALC, TRIG, CHOLHDL, LDLDIRECT in the last 72 hours. Thyroid Function Tests: No results for  input(s): TSH, T4TOTAL, FREET4, T3FREE, THYROIDAB in the last 72 hours. Anemia Panel: No results for input(s): VITAMINB12, FOLATE, FERRITIN, TIBC, IRON, RETICCTPCT in the last 72 hours. Sepsis Labs: No results for input(s): PROCALCITON, LATICACIDVEN in the last 168 hours.  Recent Results (from the past 240 hour(s))  Respiratory Panel by RT PCR (Flu A&B, Covid) - Nasopharyngeal Swab     Status: None   Collection Time: 01/22/20  8:34 AM   Specimen: Nasopharyngeal Swab  Result Value Ref Range Status   SARS Coronavirus 2 by RT PCR NEGATIVE NEGATIVE Final    Comment: (NOTE) SARS-CoV-2 target nucleic acids are NOT DETECTED.  The SARS-CoV-2 RNA is generally detectable in upper respiratoy specimens during the acute phase of infection. The lowest concentration of SARS-CoV-2 viral copies this assay can detect is 131 copies/mL. A negative result does not preclude SARS-Cov-2 infection and should not be used as the sole basis for treatment or other patient management decisions. A negative result may occur  with  improper specimen collection/handling, submission of specimen other than nasopharyngeal swab, presence of viral mutation(s) within the areas targeted by this assay, and inadequate number of viral copies (<131 copies/mL). A negative result must be combined with clinical observations, patient history, and epidemiological information. The expected result is Negative.  Fact Sheet for Patients:  PinkCheek.be  Fact Sheet for Healthcare Providers:  GravelBags.it  This test is no t yet approved or cleared by the [place] FDA and  has been authorized for detection and/or diagnosis of SARS-CoV-2 by FDA under an Emergency Use Authorization (EUA). This EUA will remain  in effect (meaning this test can be used) for the duration of the COVID-19 declaration under Section 564(b)(1) of the Act, 21 U.S.C. section 360bbb-3(b)(1), unless the authorization is terminated or revoked sooner.     Influenza A by PCR NEGATIVE NEGATIVE Final   Influenza B by PCR NEGATIVE NEGATIVE Final    Comment: (NOTE) The Xpert Xpress SARS-CoV-2/FLU/RSV assay is intended as an aid in  the diagnosis of influenza from Nasopharyngeal swab specimens and  should not be used as a sole basis for treatment. Nasal washings and  aspirates are unacceptable for Xpert Xpress SARS-CoV-2/FLU/RSV  testing.  Fact Sheet for Patients: PinkCheek.be  Fact Sheet for Healthcare Providers: GravelBags.it  This test is not yet approved or cleared by the [place] FDA and  has been authorized for detection and/or diagnosis of SARS-CoV-2 by  FDA under an Emergency Use Authorization (EUA). This EUA will remain  in effect (meaning this test can be used) for the duration of the  Covid-19 declaration under Section 564(b)(1) of the Act, 21  U.S.C. section 360bbb-3(b)(1), unless the authorization is  terminated or  revoked. Performed at [facility], [place] [address] [place],  [address]          Radiology Studies: DG Chest Port 1 View  Result Date: 01/22/2020 CLINICAL DATA:  Leg swelling.  Missed dialysis. EXAM: PORTABLE CHEST 1 VIEW COMPARISON:  04/03/2019 FINDINGS: Right IJ approach hemodialysis catheter terminates at the level of the superior cavoatrial junction. Partially visualized left axillary and proximal left upper extremity stent. The heart size and mediastinal contours are within normal limits. No focal airspace consolidation, pleural effusion, or pneumothorax. The visualized skeletal structures are unremarkable. IMPRESSION: No acute cardiopulmonary findings. Electronically Signed   By: Davina Poke D.O.   On: 01/22/2020 08:19        Scheduled Meds: . calcium acetate  667 mg Oral TID with  meals  . Chlorhexidine Gluconate Cloth  6 each Topical Q0600  . cinacalcet  90 mg Oral QHS  . ferric citrate  420 mg Oral TID with meals  . gabapentin  100 mg Oral BID  . midodrine  10 mg Oral Q T,Th,Sa-HD  . pantoprazole (PROTONIX) IV  40 mg Intravenous Q12H  . sertraline  150 mg Oral Daily  . sodium chloride flush  3 mL Intravenous Q12H  . sodium zirconium cyclosilicate  10 g Oral Daily  . cyanocobalamin  2,000 mcg Oral Daily   Continuous Infusions:   LOS: 1 day    Time spent: 35 minutes    Letetia Romanello A Anguel Delapena, MD [facility]   If 7PM-7AM, please contact night-coverage [email or web address]  01/23/2020, 7:42 AM

## 2020-01-23 NOTE — TOC Initial Note (Signed)
Transition of Care [facility]) - Initial/Assessment Note    Patient Details  Name: Mary Wilcox MRN: [id number] Date of Birth: 1955/09/20  [facility]) CM/SW Contact:    Bartholomew Crews, RN Phone Number: [phone] 01/23/2020, 2:07 PM  Clinical Narrative:                  Spoke with patient at bedside to discuss transition plan. PTA home alone on second floor apartment. Stated that she has had to call EMS to assist her down the steps when she needed to go to dialysis. Stated that she grew weary of having to call for help. Discussed alternatives for requesting ground floor apartment, moving to new location, or staying with her daughter.  Attends outpatient hemodialys at [facility] on TTS at 6:45am. [facility] [place] provides her dialysis transportation.   She has walker and a borrowed wheelchair. She asked about getting a wheelchair of her own, because the owner of the wheelchair is requesting to have the wheelchair returned. Advised that this can be arranged.   Stated that her daughter, [name], lives in [place], works 2 jobs, and travels a lot for her daughter's cheering team. Patient declined to have NCM call her daughter stating that her daughter does not know she is in the hospital and she needs to call her first.   Discussed transition plans for rehabilitation. Patient agreeable to SNF if recommended. Discussed process of reaching out to area SNFs and providing her with the bed offers to choose her facility. Patient is in agreement with faxing out her information.   Patient appreciative of NCM time.   PASSR requested and received. FL2 completed and faxed out. Bed offers pending.  TOC following for transition needs.   Expected Discharge Plan: Skilled Nursing Facility Barriers to Discharge: Continued Medical Work up   Patient Goals and CMS Choice Patient states their goals for this hospitalization and ongoing recovery are:: get rehab before returning home CMS Medicare.gov Compare  Post Acute Care list provided to:: Patient Choice offered to / list presented to : Patient  Expected Discharge Plan and Services Expected Discharge Plan: [place] In-house Referral: Clinical Social Work Discharge Planning Services: CM Consult Post Acute Care Choice: [place] arrangements for the past 2 months: Apartment                                      Prior Living Arrangements/Services Living arrangements for the past 2 months: Apartment Lives with:: Self Patient language and need for interpreter reviewed:: Yes Do you feel safe going back to the place where you live?: Yes      Need for Family Participation in Patient Care: Yes (Comment) Care giver support system in place?: No (comment) Current home services: DME (walker and a borrowed wheelchair) Criminal Activity/Legal Involvement Pertinent to Current Situation/Hospitalization: No - Comment as needed  Activities of Daily Living Home Assistive Devices/Equipment: Wheelchair, Environmental consultant (specify type) ADL Screening (condition at time of admission) [name] cognitive ability adequate to safely complete daily activities?: Yes Is the patient deaf or have difficulty hearing?: No Does the patient have difficulty seeing, even when wearing glasses/contacts?: No Does the patient have difficulty concentrating, remembering, or making decisions?: No Patient able to express need for assistance with ADLs?: Yes Does the patient have difficulty dressing or bathing?: Yes Independently performs ADLs?: No Communication: Independent Dressing (OT): Independent Grooming: Independent Feeding: Independent Bathing:  Independent with device (comment) (Does not use bathtub, performs "bird baths" at home) Toileting: Independent In/Out Bed: Independent Walks in Home: Independent with device (comment) (uses front wheel walker) Does the patient have difficulty walking or climbing stairs?: Yes Weakness of  Legs: Both Weakness of Arms/Hands: None  Permission Sought/Granted Permission sought to share information with : Family Supports Permission granted to share information with : No              Emotional Assessment Appearance:: Appears stated age Attitude/Demeanor/Rapport: Engaged Affect (typically observed): Accepting Orientation: : Oriented to Self, Oriented to  Time, Oriented to Place, Oriented to Situation Alcohol / Substance Use: Not Applicable Psych Involvement: No (comment)  Admission diagnosis:  Hyperkalemia [E87.5] Leg swelling [M79.89] Hyperkalemia, diminished renal excretion [E87.5] Gastrointestinal hemorrhage, unspecified gastrointestinal hemorrhage type [K92.2] Diarrhea, unspecified type [R19.7] Patient Active Problem List   Diagnosis Date Noted  . Hyperkalemia, diminished renal excretion 01/22/2020  . High risk social situation 01/22/2020  . Anemia 01/22/2020  . Class 2 obesity due to excess calories with body mass index (BMI) of 35.0 to 35.9 in adult 01/22/2020  . Hypothyroidism   . Pressure injury of skin 04/04/2019  . Generalized weakness 02/11/2019  . Hyponatremia 02/11/2019  . Hyperkalemia 09/28/2018  . Bradycardia   . Mechanical complication of other vascular device, implant, and graft 08/19/2011  . ESRD (end stage renal disease) ([place]) 08/05/2011  . Other complications due to renal dialysis device, implant, and graft 08/05/2011  . Secondary hyperparathyroidism (of renal origin)   . Anemia associated with chronic renal failure 03/06/2011  . Thrombocytosis 03/06/2011  . Chronic kidney disease, stage V requiring chronic dialysis ([place]) 03/05/2011  . High anion gap metabolic acidosis 40/98/1191  . Hydronephrosis, bilateral 03/05/2011  . Chronic UTI 03/05/2011  . Leukocytosis 03/05/2011   PCP:  Kristen Loader, FNP Pharmacy:   [id number] - [place], [address] [place] [address] Phone: [phone] Fax:  [phone]     Social Determinants of Health (SDOH) Interventions    Readmission Risk Interventions Readmission Risk Prevention Plan 04/07/2019 04/04/2019 02/13/2019  Transportation Screening - Complete Complete  PCP or Specialist Appt within 3-5 Days Complete Not Complete Complete  Not Complete comments - plan for SNF -  [place] or [place] - Complete Complete  Social Work Consult for [place] Planning/Counseling - Complete Complete  Palliative Care Screening - Not Applicable Not Applicable  Medication Review (RN Care Manager) Complete Referral to Pharmacy Complete  Some recent data might be hidden

## 2020-01-23 NOTE — Evaluation (Signed)
Occupational Therapy Evaluation Patient Details Name: Mary Wilcox MRN: [id number] DOB: 04-11-1955 Today's Date: 01/23/2020    History of Present Illness 64yo female who has been missing HD sessions as she did not like having to take EMS to get to dialysis. Found covered in feces and bed bugs in her apartment. Admitted with hyperkalemia. PMH R eye cataracts, cervical cancer, CKD, ESRD on HD, PVD   Clinical Impression   PTA patient reports independent with ADLs, limited IADLs, and mobility using Rollator.She reports decline in mobility over the last month and was relying on EMS transport to/from dialysis. Admitted for above and limited by problem list below including, BLE edema, decreased activity tolerance, impaired balance, generalized weakness.  She currently requires mod assist +2 for bed mobility, total assist +2 for sit to stand at EOB, and min-total assist +2 for ADLs.  She presents with decreased awareness, safety, problem solving.  She will benefit from continued OT service while admitted and after dc at SNF level to optimize independence, safety with ADLs, mobility prior to returning home.      Follow Up Recommendations  SNF;Supervision/Assistance - 24 hour    Equipment Recommendations  Other (comment) (TBD at next venue of care )    Recommendations for Other Services       Precautions / Restrictions Precautions Precautions: Fall;Other (comment) Precaution Comments: very weak, poor eccentric control, hx of bedbugs Restrictions Weight Bearing Restrictions: No      Mobility Bed Mobility Overal bed mobility: Needs Assistance Bed Mobility: Supine to Sit;Sit to Supine     Supine to sit: Mod assist;+2 for physical assistance Sit to supine: Mod assist;+2 for physical assistance   General bed mobility comments: trunk and LB support, scooting foward; increased time and cueing for problem sovling     Transfers Overall transfer level: Needs assistance Equipment used:  Rolling walker (2 wheeled) Transfers: Sit to/from Stand Sit to Stand: Total assist;+2 physical assistance         General transfer comment: max-total assist +2 to power up to standing with heavy support to maintain standing for no more than 10 seconds, posterior lean and poor eccentric control; pt pulling on RW to stand      Balance Overall balance assessment: Needs assistance Sitting-balance support: No upper extremity supported;Feet supported Sitting balance-Leahy Scale: Fair Sitting balance - Comments: statically with min guard    Standing balance support: Bilateral upper extremity supported;During functional activity Standing balance-Leahy Scale: Zero Standing balance comment: requires UE and external support +2                           ADL either performed or assessed with clinical judgement   ADL Overall ADL's : Needs assistance/impaired     Grooming: Sitting;Min guard   Upper Body Bathing: Min guard;Sitting   Lower Body Bathing: Maximal assistance;Sit to/from stand;+2 for physical assistance;+2 for safety/equipment   Upper Body Dressing : Minimal assistance;Sitting   Lower Body Dressing: Total assistance;+2 for physical assistance;+2 for safety/equipment;Sit to/from Health and safety inspector Details (indicate cue type and reason): deferred          Functional mobility during ADLs: +2 for physical assistance;+2 for safety/equipment;Rolling walker;Cueing for sequencing;Cueing for safety;Total assistance General ADL Comments: pt limited by weakness, BLE edema, impaired balance     Vision   Vision Assessment?: No apparent visual deficits     Perception     Praxis      Pertinent  Vitals/Pain Pain Assessment: Faces Faces Pain Scale: Hurts little more Pain Location: R LE especially at bruises on knee Pain Descriptors / Indicators: Aching;Sore Pain Intervention(s): Limited activity within patient's tolerance;Monitored during session;Repositioned      Hand Dominance Right   Extremity/Trunk Assessment Upper Extremity Assessment Upper Extremity Assessment: Generalized weakness   Lower Extremity Assessment Lower Extremity Assessment: Defer to PT evaluation   Cervical / Trunk Assessment Cervical / Trunk Assessment: Kyphotic   Communication Communication Communication: No difficulties   Cognition Arousal/Alertness: Awake/alert Behavior During Therapy: Flat affect Overall Cognitive Status: No family/caregiver present to determine baseline cognitive functioning Area of Impairment: Following commands;Safety/judgement;Awareness;Problem solving                       Following Commands: Follows one step commands consistently;Follows one step commands with increased time;Follows multi-step commands inconsistently Safety/Judgement: Decreased awareness of safety;Decreased awareness of deficits Awareness: Emergent Problem Solving: Slow processing;Decreased initiation;Difficulty sequencing;Requires verbal cues General Comments: pt with poor safety awareness and insight to deficits, increased time to process and follows commands; oriented   General Comments       Exercises     Shoulder Instructions      Home Living Family/patient expects to be discharged to:: Private residence Living Arrangements: Alone Available Help at Discharge: Neighbor;Available PRN/intermittently Type of Home: Apartment (2nd floor ) Home Access: Stairs to enter Entrance Stairs-Number of Steps: flight but is reliant on EMS to get up and down the steps the past month Entrance Stairs-Rails: Right;Left;Can reach both Home Layout: One level     Bathroom Shower/Tub: Teacher, early years/pre: Standard Bathroom Accessibility: Yes   Home Equipment: Environmental consultant - 4 wheels;Cane - single point;Shower seat;Grab bars - tub/shower   Additional Comments: has a WC she can borrow for HD  but has to give it back to the owner      Prior  Functioning/Environment Level of Independence: Needs assistance  Gait / Transfers Assistance Needed: reports using rollator for mobility  ADL's / Homemaking Assistance Needed: reports independent with ADLs, assist with shower transfers from "whoever will help me", cooks but has a hard time cleaning, manages her meds and gets groceries delivered             OT Problem List: Decreased strength;Decreased activity tolerance;Impaired balance (sitting and/or standing);Decreased cognition;Decreased safety awareness;Decreased knowledge of use of DME or AE;Decreased knowledge of precautions;Obesity;Pain;Increased edema      OT Treatment/Interventions: Therapeutic exercise;Self-care/ADL training;DME and/or AE instruction;Therapeutic activities;Patient/family education;Balance training    OT Goals(Current goals can be found in the care plan section) Acute Rehab OT Goals Patient Stated Goal: get stronger, go to rehab OT Goal Formulation: With patient Time For Goal Achievement: 02/06/20 Potential to Achieve Goals: Good  OT Frequency: Min 2X/week   Barriers to D/C:            Co-evaluation PT/OT/SLP Co-Evaluation/Treatment: Yes Reason for Co-Treatment: Complexity of the patient's impairments (multi-system involvement);For patient/therapist safety;To address functional/ADL transfers PT goals addressed during session: Mobility/safety with mobility;Balance;Proper use of DME;Strengthening/ROM OT goals addressed during session: ADL's and self-care      AM-PAC OT "6 Clicks" Daily Activity     Outcome Measure Help from another person eating meals?: A Little Help from another person taking care of personal grooming?: A Little Help from another person toileting, which includes using toliet, bedpan, or urinal?: A Lot Help from another person bathing (including washing, rinsing, drying)?: A Lot Help from another person to put on and taking  off regular upper body clothing?: A Little Help from another  person to put on and taking off regular lower body clothing?: Total 6 Click Score: 14   End of Session Equipment Utilized During Treatment: Rolling walker Nurse Communication: Mobility status  Activity Tolerance: Patient limited by fatigue Patient left: in bed;with call bell/phone within reach;with bed alarm set;with SCD's reapplied  OT Visit Diagnosis: Other abnormalities of gait and mobility (R26.89);Muscle weakness (generalized) (M62.81);Pain Pain - Right/Left: Right Pain - part of body: Leg;Knee                Time: 4196-2229 OT Time Calculation (min): 22 min Charges:  OT General Charges $OT Visit: 1 Visit OT Evaluation $OT Eval Moderate Complexity: 1 Mod  Jolaine Artist, OT Acute Rehabilitation Services Pager [phone] Office [phone]   Delight Stare 01/23/2020, 3:03 PM

## 2020-01-23 NOTE — Progress Notes (Signed)
Patient BP was 182/62 when last checked, PRN hydralazine was administered and MD was notified. Blood pressure will be rechecked around 1500 and RN will continue to monitor this patient.

## 2020-01-23 NOTE — NC FL2 (Signed)
[place] LEVEL OF CARE SCREENING TOOL     IDENTIFICATION  Patient Name: Mary Wilcox Birthdate: 03/31/1955 Sex: female Admission Date (Current Location): 01/22/2020  [facility] and [place] Number:  Herbalist and Address:  [facility] [place] [address] [place], [place] [address]      Provider Number: [id number]  Attending Physician Name and Address:  Elmarie Shiley, MD  Relative Name and Phone Number:       Current Level of Care: Hospital Recommended Level of Care: [place] Prior Approval Number:    Date Approved/Denied: 01/23/20 PASRR Number: [id number]  Discharge Plan: SNF    Current Diagnoses: Patient Active Problem List   Diagnosis Date Noted  . Hyperkalemia, diminished renal excretion 01/22/2020  . High risk social situation 01/22/2020  . Anemia 01/22/2020  . Class 2 obesity due to excess calories with body mass index (BMI) of 35.0 to 35.9 in adult 01/22/2020  . Hypothyroidism   . Pressure injury of skin 04/04/2019  . Generalized weakness 02/11/2019  . Hyponatremia 02/11/2019  . Hyperkalemia 09/28/2018  . Bradycardia   . Mechanical complication of other vascular device, implant, and graft 08/19/2011  . ESRD (end stage renal disease) ([place]) 08/05/2011  . Other complications due to renal dialysis device, implant, and graft 08/05/2011  . Secondary hyperparathyroidism (of renal origin)   . Anemia associated with chronic renal failure 03/06/2011  . Thrombocytosis 03/06/2011  . Chronic kidney disease, stage V requiring chronic dialysis ([place]) 03/05/2011  . High anion gap metabolic acidosis 22/29/7989  . Hydronephrosis, bilateral 03/05/2011  . Chronic UTI 03/05/2011  . Leukocytosis 03/05/2011    Orientation RESPIRATION BLADDER Height & Weight     Self, Time, Situation, Place  Normal   Weight: 80 kg Height:  4\' 11"  (149.9 cm)  BEHAVIORAL SYMPTOMS/MOOD NEUROLOGICAL BOWEL NUTRITION STATUS       Continent, Incontinent Diet  AMBULATORY STATUS COMMUNICATION OF NEEDS Skin   Extensive Assist Verbally PU Stage and Appropriate Care, Bruising (buttocks with stage 2 - bruising to bilateral arms)                       Personal Care Assistance Level of Assistance  Bathing, Feeding, Dressing Bathing Assistance: Limited assistance Feeding assistance: Independent Dressing Assistance: Limited assistance     Functional Limitations Info  Sight, Hearing, Speech Sight Info: Impaired Hearing Info: Adequate Speech Info: Adequate    SPECIAL CARE FACTORS FREQUENCY  PT (By licensed PT), OT (By licensed OT)     PT Frequency: PT at SNF to eval and treat a minimum of 5x/week OT Frequency: OT at SNF to eval and treat a minimum of 5x/week            Contractures Contractures Info: Not present    Additional Factors Info  Allergies, Code Status Code Status Info: full code Allergies Info: prozac           Current Medications (01/23/2020):  This is the current hospital active medication list Current Facility-Administered Medications  Medication Dose Route Frequency Provider Last Rate Last Admin  . acetaminophen (TYLENOL) tablet 650 mg  650 mg Oral Q6H PRN Karmen Bongo, MD       Or  . acetaminophen (TYLENOL) suppository 650 mg  650 mg Rectal Q6H PRN Karmen Bongo, MD      . albuterol (VENTOLIN HFA) 108 (90 Base) MCG/ACT inhaler 2 puff  2 puff Inhalation Q6H PRN Karmen Bongo, MD      .  calcium acetate (PHOSLO) capsule 667 mg  667 mg Oral TID with meals Karmen Bongo, MD   667 mg at 01/23/20 1251  . calcium carbonate (dosed in mg elemental calcium) suspension 500 mg of elemental calcium  500 mg of elemental calcium Oral Q6H PRN Karmen Bongo, MD      . camphor-menthol [facility]) lotion 1 application  1 application Topical Q0G PRN Karmen Bongo, MD       And  . hydrOXYzine (ATARAX/VISTARIL) tablet 25 mg  25 mg Oral Q8H PRN Karmen Bongo, MD      . Chlorhexidine Gluconate  Cloth 2 % PADS 6 each  6 each Topical Q0600 Karmen Bongo, MD      . cinacalcet [facility]) tablet 90 mg  90 mg Oral Ivery Quale, MD   90 mg at 01/22/20 2126  . Darbepoetin Alfa (ARANESP) injection 100 mcg  100 mcg Intravenous Q Tue-HD Ejigiri, Ogechi Grace, PA-C      . docusate sodium (ENEMEEZ) enema 283 mg  1 enema Rectal PRN Karmen Bongo, MD      . feeding supplement (NEPRO CARB STEADY) liquid 237 mL  237 mL Oral TID PRN Karmen Bongo, MD      . ferric citrate (AURYXIA) tablet 210 mg  210 mg Oral PRN Justin Mend, MD      . ferric citrate (AURYXIA) tablet 420 mg  420 mg Oral TID with meals Karmen Bongo, MD   420 mg at 01/23/20 1251  . fluticasone (FLONASE) 50 MCG/ACT nasal spray 1 spray  1 spray Each Nare Daily Regalado, Belkys A, MD   1 spray at 01/23/20 1230  . folic acid (FOLVITE) tablet 1 mg  1 mg Oral Daily Regalado, Belkys A, MD   1 mg at 01/23/20 1215  . gabapentin (NEURONTIN) capsule 100 mg  100 mg Oral BID Karmen Bongo, MD   100 mg at 01/23/20 0909  . hydrALAZINE (APRESOLINE) injection 5 mg  5 mg Intravenous Q4H PRN Karmen Bongo, MD      . loratadine (CLARITIN) tablet 10 mg  10 mg Oral Daily Regalado, Belkys A, MD   10 mg at 01/23/20 1230  . midodrine (PROAMATINE) tablet 10 mg  10 mg Oral Q T,Th,Sa-HD Karmen Bongo, MD   10 mg at 01/23/20 1252  . nystatin (MYCOSTATIN/NYSTOP) topical powder 1 application  1 application Topical BID Regalado, Belkys A, MD   1 application at 86/76/19 1215  . ondansetron (ZOFRAN) tablet 4 mg  4 mg Oral Q6H PRN Karmen Bongo, MD       Or  . ondansetron [facility]) injection 4 mg  4 mg Intravenous Q6H PRN Karmen Bongo, MD      . pantoprazole (PROTONIX) injection 40 mg  40 mg Intravenous Q12H Salley Slaughter, PA-C   40 mg at 01/23/20 0909  . sertraline (ZOLOFT) tablet 150 mg  150 mg Oral Daily Karmen Bongo, MD   150 mg at 01/23/20 0914  . sodium chloride flush (NS) 0.9 % injection 3 mL  3 mL Intravenous Q12H Karmen Bongo, MD   3 mL at 01/23/20 0912  . sodium zirconium cyclosilicate (LOKELMA) packet 10 g  10 g Oral Daily Karmen Bongo, MD   10 g at 01/23/20 0914  . sorbitol 70 % solution 30 mL  30 mL Oral PRN Karmen Bongo, MD      . vitamin B-12 (CYANOCOBALAMIN) tablet 2,000 mcg  2,000 mcg Oral Daily Karmen Bongo, MD   2,000 mcg at 01/23/20 0914  . zolpidem (  AMBIEN) tablet 5 mg  5 mg Oral QHS PRN Karmen Bongo, MD   5 mg at 01/23/20 0046     Discharge Medications: Please see discharge summary for a list of discharge medications.  Relevant Imaging Results:  Relevant Lab Results:   Additional Information Hemodialysis TTS [place] 6:45am - has [facility] [place] for dialysis transportation - patient is vaccinated against covid  Bartholomew Crews, RN

## 2020-01-23 NOTE — Progress Notes (Signed)
Jamesetta So, RN called and made aware that the pt's HD tx has been moved to 01/24/20.

## 2020-01-23 NOTE — Evaluation (Addendum)
Physical Therapy Evaluation Patient Details Name: Mary Wilcox MRN: [id number] DOB: [birth year] Today's Date: 01/23/2020   History of Present Illness  64yo female who has been missing HD sessions as she did not like having to take EMS to get to dialysis. Found covered in feces and bed bugs in her apartment. Admitted with hyperkalemia. PMH R eye cataracts, cervical cancer, CKD, ESRD on HD, PVD  Clinical Impression   Patient received in bed, sleeping but easily woken but very flat. Reports some right knee pain, and does have bruising present over both patellar areas. Required heavy physical assistance to get to EOB including scooting forward on the side of the mattress, but able to maintain midline well. Stood twice with totalAx2 and both times unable to maintain standing more than 10 seconds and suddenly sat back down on bed with very poor eccentric control. Unable to progress further due to fatigue. Left positioned to comfort in bed with all needs met, bed alarm active. Strongly recommend SNF and 24/7A moving forward.     Follow Up Recommendations SNF;Supervision/Assistance - 24 hour    Equipment Recommendations  Rolling walker with 5" wheels;3in1 (PT);Wheelchair (measurements PT);Wheelchair cushion (measurements PT)    Recommendations for Other Services       Precautions / Restrictions Precautions Precautions: Fall;Other (comment) Precaution Comments: very weak, poor eccentric control, hx of bedbugs Restrictions Weight Bearing Restrictions: No      Mobility  Bed Mobility Overal bed mobility: Needs Assistance Bed Mobility: Supine to Sit;Sit to Supine     Supine to sit: Mod assist;+2 for physical assistance Sit to supine: Mod assist;+2 for physical assistance   General bed mobility comments: ModAx2 to manage trunk and BLEs while getting to EOB, also for leaning side to side and scooting hips forward closer to EOB; once at EOB, able to maintain midline well     Transfers Overall transfer level: Needs assistance Equipment used: Rolling walker (2 wheeled) Transfers: Sit to/from Stand Sit to Stand: Total assist;+2 physical assistance         General transfer comment: heavy physical assist to get to standing position with RW, once up unable to stand more than 10 seconds before she had to sit back down on bed. Very poor eccentric control.  Ambulation/Gait             General Gait Details: unable  Stairs            Wheelchair Mobility    Modified Rankin (Stroke Patients Only)       Balance Overall balance assessment: Needs assistance Sitting-balance support: Bilateral upper extremity supported;Feet supported Sitting balance-Leahy Scale: Fair     Standing balance support: Bilateral upper extremity supported;During functional activity Standing balance-Leahy Scale: Poor Standing balance comment: reliant on BUE support                             Pertinent Vitals/Pain Pain Assessment: Faces Faces Pain Scale: Hurts little more Pain Location: R LE especially at bruises on knee Pain Descriptors / Indicators: Aching;Sore Pain Intervention(s): Limited activity within patient's tolerance;Monitored during session    [place] expects to be discharged to:: Private residence Living Arrangements: Alone Available Help at Discharge: Neighbor;Available PRN/intermittently Type of Home: Apartment (2nd floor apartment) Home Access: Stairs to enter Entrance Stairs-Rails: Right;Left;Can reach both Entrance Stairs-Number of Steps: flight but is reliant on EMS to get up and down the steps the past month Home Layout: One level Home  Equipment: Gilford Rile - 4 wheels;Cane - single point;Shower seat;Grab bars - tub/shower Additional Comments: has a WC she can borrow for HD  but has to give it back to the owner    Prior Function Level of Independence: Needs assistance   Gait / Transfers Assistance Needed: needs  help getting in/out of the shower, "whoever I can get"  ADL's / Homemaking Assistance Needed: cooks but has a hard time cleaning; managing meds, gets groceries delivered        Hand Dominance        Extremity/Trunk Assessment   Upper Extremity Assessment Upper Extremity Assessment: Defer to OT evaluation    Lower Extremity Assessment Lower Extremity Assessment: Generalized weakness    Cervical / Trunk Assessment Cervical / Trunk Assessment: Kyphotic  Communication   Communication: No difficulties  Cognition Arousal/Alertness: Awake/alert Behavior During Therapy: Flat affect Overall Cognitive Status: No family/caregiver present to determine baseline cognitive functioning                                 General Comments: very flat and sometimes falling asleep while therapists were talking to her; poor safety awareness as well as poor insight into deficits      General Comments      Exercises     Assessment/Plan    PT Assessment Patient needs continued PT services  PT Problem List Decreased strength;Decreased cognition;Decreased knowledge of use of DME;Obesity;Decreased activity tolerance;Decreased safety awareness;Decreased balance;Decreased mobility;Decreased coordination       PT Treatment Interventions DME instruction;Balance training;Gait training;Stair training;Cognitive remediation;Functional mobility training;Patient/family education;Therapeutic activities;Therapeutic exercise    PT Goals (Current goals can be found in the Care Plan section)  Acute Rehab PT Goals Patient Stated Goal: get stronger, go to rehab PT Goal Formulation: With patient Time For Goal Achievement: 02/06/20 Potential to Achieve Goals: Fair    Frequency Min 2X/week   Barriers to discharge        Co-evaluation PT/OT/SLP Co-Evaluation/Treatment: Yes Reason for Co-Treatment: Complexity of the patient's impairments (multi-system involvement);For patient/therapist  safety;To address functional/ADL transfers PT goals addressed during session: Mobility/safety with mobility;Balance;Proper use of DME;Strengthening/ROM         AM-PAC PT "6 Clicks" Mobility  Outcome Measure Help needed turning from your back to your side while in a flat bed without using bedrails?: A Little Help needed moving from lying on your back to sitting on the side of a flat bed without using bedrails?: A Lot Help needed moving to and from a bed to a chair (including a wheelchair)?: Total Help needed standing up from a chair using your arms (e.g., wheelchair or bedside chair)?: Total Help needed to walk in hospital room?: Total Help needed climbing 3-5 steps with a railing? : Total 6 Click Score: 9    End of Session Equipment Utilized During Treatment: Gait belt Activity Tolerance: Patient tolerated treatment well;Patient limited by fatigue Patient left: in bed;with call bell/phone within reach;with bed alarm set Nurse Communication: Mobility status PT Visit Diagnosis: Unsteadiness on feet (R26.81);Difficulty in walking, not elsewhere classified (R26.2);Muscle weakness (generalized) (M62.81)    Time: 3329-5188 PT Time Calculation (min) (ACUTE ONLY): 22 min   Charges:   PT Evaluation $PT Eval Moderate Complexity: 1 Mod          Windell Norfolk, DPT, PN1   Supplemental Physical Therapist [place]    Pager [phone] Acute Rehab Office [phone]

## 2020-01-23 NOTE — Progress Notes (Signed)
  [facility] Progress Note   Subjective:  Had short dialysis yesterday, no issues. K+ improved No new complaints this am. Legs feel better. For dialysis again today.   Objective Vitals:   01/22/20 2300 01/22/20 2315 01/23/20 0023 01/23/20 0415  BP: (!) 156/61 (!) 165/59 (!) 147/95 133/65  Pulse: 78 86 80 79  Resp: (!) 0 20 16 18   Temp:   98.2 F (36.8 C) 97.8 F (36.6 C)  TempSrc:   Oral Oral  SpO2: 98% 98% 98% 100%  Weight:      Height:        Additional Objective Labs: Basic Metabolic Panel: Recent Labs  Lab 01/22/20 0650 01/22/20 1139 01/23/20 0222  NA 135 136 136  K >7.5* 5.6* 4.9  CL 96* 97* 98  CO2 14* 19* 23  GLUCOSE 87 101* 78  BUN 101* 85* 40*  CREATININE 17.80* 15.27* 10.36*  CALCIUM 8.6* 8.7* 8.9  PHOS  --  7.3*  --    CBC: Recent Labs  Lab 01/22/20 0650 01/22/20 1800 01/23/20 0222  WBC 13.3* 9.3 8.5  HGB 8.7* 7.6* 7.4*  HCT 28.7* 23.4* 24.1*  MCV 101.8* 101.3* 99.2  PLT 302 277 221   Blood Culture    Component Value Date/Time   SDES URINE, CATHETERIZED 04/12/2011 0335   SPECREQUEST NONE 04/12/2011 0335   CULT ESCHERICHIA COLI 04/12/2011 0335   REPTSTATUS 04/14/2011 FINAL 04/12/2011 0335     Physical Exam General: WNWD woman, conversant nad  Heart: RRR Lungs: Clear bilaterally  Abdomen: obese, non-tender  Extremities: 2+ pitting LE edema, bilaterally  Dialysis Access: R IJ TDC   Medications:  . calcium acetate  667 mg Oral TID with meals  . Chlorhexidine Gluconate Cloth  6 each Topical Q0600  . cinacalcet  90 mg Oral QHS  . ferric citrate  420 mg Oral TID with meals  . gabapentin  100 mg Oral BID  . midodrine  10 mg Oral Q T,Th,Sa-HD  . pantoprazole (PROTONIX) IV  40 mg Intravenous Q12H  . sertraline  150 mg Oral Daily  . sodium chloride flush  3 mL Intravenous Q12H  . sodium zirconium cyclosilicate  10 g Oral Daily  . cyanocobalamin  2,000 mcg Oral Daily    Dialysis Orders:  TTS [facility], 3:45h, 160 NR,  400/800, 1k/2.5Ca, EDW 82kg CVC Mircera 100 q2wks , venofer 50 weekly Last HD 10/30 - then 10/23, 10/16. Post wt 81.6kg.    Assessment/Plan: **Hyperkalemia:   Generally running on 1K outpt.   Low K diet.  Lokelma 10 daily here for now.  Improved s/p urgent HD 11/8. HD on schedule today   **ESRD on HD:   Back on  TTS schedule. Look into alt living situations as she says she can't make it down from 2nd story apt to go to HD.    **Anemia:  possible GIB:  Hb 10/30 at HD 8.8. Trending down here 8.7 >7.4 with +hemoccult stool.  GI w/u per primary. Last ESA outpt 10/9 - will give aranesp here.   **Secondary hyperPTH: 10/23 outpt PTH 339, phos 6.4, Ca 7.8.  Not on VDRA. Cont [place].  **HTN:  Not on antiHTN outpt.  Monitor with UF.  Midodrine actually on med list.    Lynnda Child PA-C [facility] 01/23/2020,10:23 AM

## 2020-01-24 DIAGNOSIS — E875 Hyperkalemia: Secondary | ICD-10-CM | POA: Diagnosis not present

## 2020-01-24 LAB — CBC
HCT: 20.7 % — ABNORMAL LOW (ref 36.0–46.0)
Hemoglobin: 6.6 g/dL — CL (ref 12.0–15.0)
MCH: 31 pg (ref 26.0–34.0)
MCHC: 31.9 g/dL (ref 30.0–36.0)
MCV: 97.2 fL (ref 80.0–100.0)
Platelets: 225 10*3/uL (ref 150–400)
RBC: 2.13 MIL/uL — ABNORMAL LOW (ref 3.87–5.11)
RDW: 15.6 % — ABNORMAL HIGH (ref 11.5–15.5)
WBC: 9.2 10*3/uL (ref 4.0–10.5)
nRBC: 0 % (ref 0.0–0.2)

## 2020-01-24 LAB — RENAL FUNCTION PANEL
Albumin: 2.4 g/dL — ABNORMAL LOW (ref 3.5–5.0)
Anion gap: 18 — ABNORMAL HIGH (ref 5–15)
BUN: 49 mg/dL — ABNORMAL HIGH (ref 8–23)
CO2: 20 mmol/L — ABNORMAL LOW (ref 22–32)
Calcium: 8.5 mg/dL — ABNORMAL LOW (ref 8.9–10.3)
Chloride: 93 mmol/L — ABNORMAL LOW (ref 98–111)
Creatinine, Ser: 11.57 mg/dL — ABNORMAL HIGH (ref 0.44–1.00)
GFR, Estimated: 3 mL/min — ABNORMAL LOW (ref >60)
Glucose, Bld: 78 mg/dL (ref 70–99)
Phosphorus: 6.6 mg/dL — ABNORMAL HIGH (ref 2.5–4.6)
Potassium: 5 mmol/L (ref 3.5–5.1)
Sodium: 131 mmol/L — ABNORMAL LOW (ref 135–145)

## 2020-01-24 LAB — PREPARE RBC (CROSSMATCH)

## 2020-01-24 LAB — TSH: TSH: 3.249 u[IU]/mL (ref 0.350–4.500)

## 2020-01-24 MED ORDER — ACETAMINOPHEN 325 MG PO TABS
ORAL_TABLET | ORAL | Status: AC
  Filled 2020-01-24: qty 2

## 2020-01-24 MED ORDER — MIDODRINE HCL 5 MG PO TABS
ORAL_TABLET | ORAL | Status: AC
  Administered 2020-01-24: 10 mg via ORAL
  Filled 2020-01-24: qty 2

## 2020-01-24 MED ORDER — HEPARIN SODIUM (PORCINE) 1000 UNIT/ML IJ SOLN
INTRAMUSCULAR | Status: AC
  Filled 2020-01-24: qty 4

## 2020-01-24 MED ORDER — SODIUM CHLORIDE 0.9% IV SOLUTION: Freq: Once | INTRAVENOUS | Status: DC

## 2020-01-24 MED ORDER — DARBEPOETIN ALFA 100 MCG/0.5ML IJ SOSY
PREFILLED_SYRINGE | INTRAMUSCULAR | Status: AC
  Administered 2020-01-24: 100 ug via INTRAVENOUS
  Filled 2020-01-24: qty 0.5

## 2020-01-24 MED ORDER — DARBEPOETIN ALFA 100 MCG/0.5ML IJ SOSY
100.0000 ug | PREFILLED_SYRINGE | INTRAMUSCULAR | Status: DC
  Filled 2020-01-24: qty 0.5

## 2020-01-24 NOTE — Progress Notes (Signed)
[facility] Progress Note   Subjective:  Seen in room. No cp, sob. Legs weak, couldn't walk w PT yesterday Hgb dropping -for transfusion today with dialysis   Objective Vitals:   01/23/20 1417 01/23/20 1545 01/23/20 2112 01/24/20 0431  BP: (!) 182/62 (!) 148/61 (!) 152/52 (!) 177/51  Pulse:  85 81 72  Resp:  19 20 18   Temp:  99.1 F (37.3 C) 99.2 F (37.3 C) 99.4 F (37.4 C)  TempSrc:  Oral Oral Oral  SpO2:  97% 98% 98%  Weight:   91.9 kg   Height:        Additional Objective Labs: Basic Metabolic Panel: Recent Labs  Lab 01/22/20 1139 01/23/20 0222 01/24/20 0507  NA 136 136 131*  K 5.6* 4.9 5.0  CL 97* 98 93*  CO2 19* 23 20*  GLUCOSE 101* 78 78  BUN 85* 40* 49*  CREATININE 15.27* 10.36* 11.57*  CALCIUM 8.7* 8.9 8.5*  PHOS 7.3*  --  6.6*   CBC: Recent Labs  Lab 01/22/20 0650 01/22/20 0650 01/22/20 1800 01/23/20 0222 01/24/20 0507  WBC 13.3*   < > 9.3 8.5 9.2  HGB 8.7*   < > 7.6* 7.4* 6.6*  HCT 28.7*   < > 23.4* 24.1* 20.7*  MCV 101.8*  --  101.3* 99.2 97.2  PLT 302   < > 277 221 225   < > = values in this interval not displayed.   Blood Culture    Component Value Date/Time   SDES URINE, CATHETERIZED 04/12/2011 0335   SPECREQUEST NONE 04/12/2011 0335   CULT ESCHERICHIA COLI 04/12/2011 0335   REPTSTATUS 04/14/2011 FINAL 04/12/2011 0335     Physical Exam General: WNWD woman, conversant nad  Heart: RRR Lungs: Clear bilaterally  Abdomen: obese, non-tender  Extremities: 2+ pitting LE edema, bilaterally  Dialysis Access: R IJ TDC   Medications:  . sodium chloride   Intravenous Once  . sodium chloride   Intravenous Once  . calcium acetate  667 mg Oral TID with meals  . Chlorhexidine Gluconate Cloth  6 each Topical Q0600  . cinacalcet  90 mg Oral QHS  . darbepoetin (ARANESP) injection - DIALYSIS  100 mcg Intravenous Q Wed-HD  . ferric citrate  420 mg Oral TID with meals  . fluticasone  1 spray Each Nare Daily  . folic acid  1  mg Oral Daily  . gabapentin  100 mg Oral BID  . loratadine  10 mg Oral Daily  . midodrine  10 mg Oral Q T,Th,Sa-HD  . nystatin  1 application Topical BID  . pantoprazole (PROTONIX) IV  40 mg Intravenous Q12H  . sertraline  150 mg Oral Daily  . sodium chloride flush  3 mL Intravenous Q12H  . sodium zirconium cyclosilicate  10 g Oral Daily    Dialysis Orders:  TTS [facility], 3:45h, 160 NR, 400/800, 1k/2.5Ca, EDW 82kg CVC Mircera 100 q2wks , venofer 50 weekly Last HD 10/30 - then 10/23, 10/16. Post wt 81.6kg.    Assessment/Plan: **Hyperkalemia:   Generally running on 1K outpt.   Low K diet.  Lokelma 10 daily here for now.  Improved s/p urgent HD 11/8. HD today off schedule.   **ESRD on HD:   Usual TTS. Off schedule d/t emergent needs/ high inpatient dialysis census. HD off schedule today -back on schedule tomorrow.   CM Looking into alt living situations as she says she can't make it down from 2nd story apt to go to HD.    **  Anemia:  possible GIB:  Hb 10/30 at HD 8.8. Trending down here 8.7 >7.4 with +hemoccult stool.  GI w/u per primary. Last ESA outpt 10/9 - will give aranesp here.   **Secondary hyperPTH: 10/23 outpt PTH 339, phos 6.4, Ca 7.8.  Not on VDRA. Cont [place].  **HTN:  Not on antiHTN outpt.  Monitor with UF.  Midodrine actually on med list.    **Dispo: PT recommending SNF   [facility] Earthly PA-C [facility] 01/24/2020,9:52 AM

## 2020-01-24 NOTE — Progress Notes (Signed)
PROGRESS NOTE    CACI ORREN  ZOX:[id number] DOB: 1955/07/14 DOA: 01/22/2020 PCP: Kristen Loader, FNP   Brief Narrative: 64 year old with past medical history significant for PVD, ESRD on hemodialysis, hypothyroidism, remote cervical cancer status post external radiation presented after missing hemodialysis.  Patient lives in an upstairs apartment and uses a walker to ambulate and she also use a wheelchair when she leaving the apartment.  She has called EMS to be transported to hemodialysis.  She has been weak and unable to ambulate for this reason she stopped going to hemodialysis.  When EMS arrived she was covered in feces and bedbugs.  Evaluation in the ED: She is not able to go to her outpatient hemodialysis because she is not able to go up and down the stairs.  She has worsening of her chronic diarrhea.  Hemoglobin has dropped to 8.5 from 11-12.  Patient with a potassium of 7.5.  Received calcium, insulin and glucose in the ED. -11/10: hb down to 6.8, 2 units PRBC ordered   Assessment & Plan:   Hyperkalemia: Volume overload ESRD on hemodialysis -Due to missed HD -Patient received calcium gluconate, insulin and glucose and Lokelma. -Corrected with hemodialysis. -PT OT eval completed, SNF recommended  Acute on chronic anemia Anemia of chronic disease and heme positive stools -Hemoglobin down to 6.6 this morning, for 2 units of PRBC -Hemoccult positive, patient reports intermittent history of dark stools which she attributed to iron and binders -Seen by gastroenterology on admission, recommended infectious work-up and outpatient GI evaluation for anemia, will discuss with GI further, limited social support and burden of chronic illnesses may limit outpatient work-up  Hypothyroidism: Continue with Synthroid  Acute on chronic diarrhea: -Could be from malabsorption, C. difficile and GI pathogen panel ordered on admission, still pending, patient was incontinent and hence  reportedly RN could not obtain specimen yesterday  Obesity; -Needs weight loss/lifestyle modification  Chronic hypotension: Continue with midodrine.   Pressure Injury 04/04/19 Buttocks Bilateral;Mid Stage 1 -  Intact skin with non-blanchable redness of a localized area usually over a bony prominence. (Active)  04/04/19 0039  Location: Buttocks  Location Orientation: Bilateral;Mid  Staging: Stage 1 -  Intact skin with non-blanchable redness of a localized area usually over a bony prominence.  Wound Description (Comments):   Present on Admission: Yes     Pressure Injury 01/23/20 Buttocks Right;Other (Comment) Stage 2 -  Partial thickness loss of dermis presenting as a shallow open injury with a red, pink wound bed without slough. (Active)  01/23/20 0000  Location: Buttocks  Location Orientation: Right;Other (Comment)  Staging: Stage 2 -  Partial thickness loss of dermis presenting as a shallow open injury with a red, pink wound bed without slough.  Wound Description (Comments):   Present on Admission: Yes     Pressure Injury 01/23/20 Buttocks Left Stage 2 -  Partial thickness loss of dermis presenting as a shallow open injury with a red, pink wound bed without slough. (Active)  01/23/20 0000  Location: Buttocks  Location Orientation: Left  Staging: Stage 2 -  Partial thickness loss of dermis presenting as a shallow open injury with a red, pink wound bed without slough.  Wound Description (Comments):   Present on Admission: Yes     Estimated body mass index is 40.3 kg/m as calculated from the following:   Height as of this encounter: 4\' 11"  (1.499 m).   Weight as of this encounter: 90.5 kg.   DVT prophylaxis: SCDs Code Status: Full  code Family Communication: Discussed with patient Disposition Plan:  Status is: Inpatient  Remains inpatient appropriate because:Ongoing diagnostic testing needed not appropriate for outpatient work up   Dispo: The patient is from: Home               Anticipated d/c is to: SNF              Anticipated d/c date is: 2 days              Patient currently is not medically stable to d/c.   Consultants:   Nephrology  Procedures:   Hemodialysis  Antimicrobials:    Subjective: -Having mild intermittent diarrhea, hemoglobin down to 6.6 this morning  Objective: Vitals:   01/24/20 1320 01/24/20 1325 01/24/20 1340 01/24/20 1350  BP: (!) 152/63 (!) 150/73 (!) 148/60 (!) 161/65  Pulse:      Resp: 18 18 (!) 23 (!) 21  Temp:    98.6 F (37 C)  TempSrc:    Oral  SpO2:      Weight:      Height:        Intake/Output Summary (Last 24 hours) at 01/24/2020 1406 Last data filed at 01/24/2020 1350 Gross per 24 hour  Intake 1145 ml  Output 2000 ml  Net -855 ml   Filed Weights   01/22/20 0517 01/23/20 2112 01/24/20 1035  Weight: 80 kg 91.9 kg 90.5 kg    Examination:  General exam: Chronically ill obese female sitting up in bed, AAOx3, no distress HEENT: Right IJ HD catheter noted CVS: S1-S2, regular rate rhythm Lungs: Decreased breath sounds the bases Abdomen: Soft, nontender, bowel sounds present Extremities: 2+ edema Skin: No rashes on exposed skin   Data Reviewed: I have personally reviewed following labs and imaging studies  CBC: Recent Labs  Lab 01/22/20 0650 01/22/20 1800 01/23/20 0222 01/24/20 0507  WBC 13.3* 9.3 8.5 9.2  HGB 8.7* 7.6* 7.4* 6.6*  HCT 28.7* 23.4* 24.1* 20.7*  MCV 101.8* 101.3* 99.2 97.2  PLT 302 277 221 161   Basic Metabolic Panel: Recent Labs  Lab 01/22/20 0650 01/22/20 1139 01/23/20 0222 01/24/20 0507  NA 135 136 136 131*  K >7.5* 5.6* 4.9 5.0  CL 96* 97* 98 93*  CO2 14* 19* 23 20*  GLUCOSE 87 101* 78 78  BUN 101* 85* 40* 49*  CREATININE 17.80* 15.27* 10.36* 11.57*  CALCIUM 8.6* 8.7* 8.9 8.5*  PHOS  --  7.3*  --  6.6*   GFR: Estimated Creatinine Clearance: 4.8 mL/min (A) (by C-G formula based on SCr of 11.57 mg/dL (H)). Liver Function Tests: Recent Labs  Lab  01/22/20 1139 01/24/20 0507  ALBUMIN 2.9* 2.4*   No results for input(s): LIPASE, AMYLASE in the last 168 hours. No results for input(s): AMMONIA in the last 168 hours. Coagulation Profile: No results for input(s): INR, PROTIME in the last 168 hours. Cardiac Enzymes: No results for input(s): CKTOTAL, CKMB, CKMBINDEX, TROPONINI in the last 168 hours. BNP (last 3 results) No results for input(s): PROBNP in the last 8760 hours. HbA1C: No results for input(s): HGBA1C in the last 72 hours. CBG: Recent Labs  Lab 01/23/20 0026  GLUCAP 69*   Lipid Profile: No results for input(s): CHOL, HDL, LDLCALC, TRIG, CHOLHDL, LDLDIRECT in the last 72 hours. Thyroid Function Tests: Recent Labs    01/24/20 0507  TSH 3.249   Anemia Panel: Recent Labs    01/23/20 0808  VITAMINB12 4,639*  FOLATE 4.7*  FERRITIN 527*  TIBC  154*  IRON 56  RETICCTPCT 2.0   Sepsis Labs: No results for input(s): PROCALCITON, LATICACIDVEN in the last 168 hours.  Recent Results (from the past 240 hour(s))  Respiratory Panel by RT PCR (Flu A&B, Covid) - Nasopharyngeal Swab     Status: None   Collection Time: 01/22/20  8:34 AM   Specimen: Nasopharyngeal Swab  Result Value Ref Range Status   SARS Coronavirus 2 by RT PCR NEGATIVE NEGATIVE Final    Comment: (NOTE) SARS-CoV-2 target nucleic acids are NOT DETECTED.  The SARS-CoV-2 RNA is generally detectable in upper respiratoy specimens during the acute phase of infection. The lowest concentration of SARS-CoV-2 viral copies this assay can detect is 131 copies/mL. A negative result does not preclude SARS-Cov-2 infection and should not be used as the sole basis for treatment or other patient management decisions. A negative result may occur with  improper specimen collection/handling, submission of specimen other than nasopharyngeal swab, presence of viral mutation(s) within the areas targeted by this assay, and inadequate number of viral copies (<131  copies/mL). A negative result must be combined with clinical observations, patient history, and epidemiological information. The expected result is Negative.  Fact Sheet for Patients:  PinkCheek.be  Fact Sheet for Healthcare Providers:  GravelBags.it  This test is no t yet approved or cleared by the [place] FDA and  has been authorized for detection and/or diagnosis of SARS-CoV-2 by FDA under an Emergency Use Authorization (EUA). This EUA will remain  in effect (meaning this test can be used) for the duration of the COVID-19 declaration under Section 564(b)(1) of the Act, 21 U.S.C. section 360bbb-3(b)(1), unless the authorization is terminated or revoked sooner.     Influenza A by PCR NEGATIVE NEGATIVE Final   Influenza B by PCR NEGATIVE NEGATIVE Final    Comment: (NOTE) The Xpert Xpress SARS-CoV-2/FLU/RSV assay is intended as an aid in  the diagnosis of influenza from Nasopharyngeal swab specimens and  should not be used as a sole basis for treatment. Nasal washings and  aspirates are unacceptable for Xpert Xpress SARS-CoV-2/FLU/RSV  testing.  Fact Sheet for Patients: PinkCheek.be  Fact Sheet for Healthcare Providers: GravelBags.it  This test is not yet approved or cleared by the [place] FDA and  has been authorized for detection and/or diagnosis of SARS-CoV-2 by  FDA under an Emergency Use Authorization (EUA). This EUA will remain  in effect (meaning this test can be used) for the duration of the  Covid-19 declaration under Section 564(b)(1) of the Act, 21  U.S.C. section 360bbb-3(b)(1), unless the authorization is  terminated or revoked. Performed at [facility], [place] [address], [place], [place] [address]      Scheduled Meds: . heparin sodium (porcine)      . sodium chloride   Intravenous Once  . sodium chloride   Intravenous Once  .  calcium acetate  667 mg Oral TID with meals  . Chlorhexidine Gluconate Cloth  6 each Topical Q0600  . cinacalcet  90 mg Oral QHS  . darbepoetin (ARANESP) injection - DIALYSIS  100 mcg Intravenous Q Wed-HD  . ferric citrate  420 mg Oral TID with meals  . fluticasone  1 spray Each Nare Daily  . folic acid  1 mg Oral Daily  . gabapentin  100 mg Oral BID  . loratadine  10 mg Oral Daily  . midodrine  10 mg Oral Q T,Th,Sa-HD  . nystatin  1 application Topical BID  . pantoprazole (PROTONIX) IV  40 mg Intravenous Q12H  . sertraline  150 mg Oral Daily  . sodium chloride flush  3 mL Intravenous Q12H  . sodium zirconium cyclosilicate  10 g Oral Daily   Continuous Infusions:   LOS: 2 days    Time spent: 35 minutes  Domenic Polite, MD [facility]  01/24/2020, 2:06 PM

## 2020-01-24 NOTE — Progress Notes (Signed)
HOSPITAL MEDICINE OVERNIGHT EVENT NOTE    Patient noted to have a hemoglobin of 6.6 this morning compared to 7.4 yesterday.  Patient has had downtrending hemoglobin throughout the hospitalization.    Chart reviewed, daytime provider was concerned for possible GI bleeding superimposed on anemia of chronic disease.  Patient is already on Protonix 40 mg IV every 12.  No active use of blood thinners at this time.  Ordering 2 units of packed red blood cells to be transfused today when patient goes to dialysis.  Per my discussion with nursing there is no evidence of melena, blood in the stool or other evidence of gastrointestinal bleeding.  We will leave consideration of gastroenterology consultation up to the daytime provider.  Vernelle Emerald  MD [facility]

## 2020-01-25 DIAGNOSIS — E875 Hyperkalemia: Secondary | ICD-10-CM | POA: Diagnosis not present

## 2020-01-25 LAB — GASTROINTESTINAL PANEL BY PCR, STOOL (REPLACES STOOL CULTURE)

## 2020-01-25 LAB — CBC
HCT: 28.1 % — ABNORMAL LOW (ref 36.0–46.0)
Hemoglobin: 9.4 g/dL — ABNORMAL LOW (ref 12.0–15.0)
MCH: 30.2 pg (ref 26.0–34.0)
MCHC: 33.5 g/dL (ref 30.0–36.0)
MCV: 90.4 fL (ref 80.0–100.0)
Platelets: 201 10*3/uL (ref 150–400)
RBC: 3.11 MIL/uL — ABNORMAL LOW (ref 3.87–5.11)
RDW: 17.7 % — ABNORMAL HIGH (ref 11.5–15.5)
WBC: 8.2 10*3/uL (ref 4.0–10.5)
nRBC: 0 % (ref 0.0–0.2)

## 2020-01-25 LAB — C DIFFICILE QUICK SCREEN W PCR REFLEX
C Diff antigen: POSITIVE — AB
C Diff toxin: NEGATIVE

## 2020-01-25 LAB — TYPE AND SCREEN
ABO/RH(D): AB POS
Antibody Screen: NEGATIVE
Unit division: 0
Unit division: 0

## 2020-01-25 LAB — BPAM RBC
Blood Product Expiration Date: 202111302359
Blood Product Expiration Date: 202112062359
ISSUE DATE / TIME: 202111101208
ISSUE DATE / TIME: 202111101208
Unit Type and Rh: 8400
Unit Type and Rh: 8400

## 2020-01-25 LAB — CLOSTRIDIUM DIFFICILE BY PCR, REFLEXED: Toxigenic C. Difficile by PCR: NEGATIVE

## 2020-01-25 MED ORDER — ALTEPLASE 2 MG IJ SOLR: 2.0000 mg | Freq: Once | INTRAMUSCULAR | Status: DC | PRN

## 2020-01-25 MED ORDER — HEPARIN SODIUM (PORCINE) 1000 UNIT/ML IJ SOLN
INTRAMUSCULAR | Status: AC
  Administered 2020-01-25: 3200 [IU] via INTRAVENOUS_CENTRAL
  Filled 2020-01-25: qty 4

## 2020-01-25 MED ORDER — LIDOCAINE-PRILOCAINE 2.5-2.5 % EX CREA: 1.0000 "application " | TOPICAL_CREAM | CUTANEOUS | Status: DC | PRN

## 2020-01-25 MED ORDER — SODIUM CHLORIDE 0.9 % IV SOLN: 100.0000 mL | INTRAVENOUS | Status: DC | PRN

## 2020-01-25 MED ORDER — HEPARIN SODIUM (PORCINE) 1000 UNIT/ML DIALYSIS: 1000.0000 [IU] | INTRAMUSCULAR | Status: DC | PRN

## 2020-01-25 MED ORDER — CHLORHEXIDINE GLUCONATE CLOTH 2 % EX PADS
6.0000 | MEDICATED_PAD | Freq: Every day | CUTANEOUS | Status: DC
  Administered 2020-01-25 – 2020-01-26 (×2): 6 via TOPICAL

## 2020-01-25 MED ORDER — LIDOCAINE HCL (PF) 1 % IJ SOLN: 5.0000 mL | INTRAMUSCULAR | Status: DC | PRN

## 2020-01-25 MED ORDER — PENTAFLUOROPROP-TETRAFLUOROETH EX AERO: 1.0000 "application " | INHALATION_SPRAY | CUTANEOUS | Status: DC | PRN

## 2020-01-25 NOTE — Progress Notes (Signed)
[facility] Progress Note   Subjective:  Had dialysis yesterday -only  2L UF  Feels better today s/p transfusion. No cp, sob   Objective Vitals:   01/24/20 1645 01/24/20 2030 01/25/20 0429 01/25/20 0936  BP: (!) 154/60 (!) 170/68 (!) 168/70 (!) 176/69  Pulse: 65 68 68 69  Resp: 18 18 18 20   Temp: 98.7 F (37.1 C) 99.4 F (37.4 C) 99.2 F (37.3 C) 99.3 F (37.4 C)  TempSrc: Oral Oral Oral Oral  SpO2: 97% 97% 95% 95%  Weight:  89.9 kg    Height:        Additional Objective Labs: Basic Metabolic Panel: Recent Labs  Lab 01/22/20 1139 01/22/20 1139 01/23/20 0222 01/24/20 0507 01/25/20 0047  NA 136   < > 136 131* 134*  K 5.6*   < > 4.9 5.0 3.9  CL 97*   < > 98 93* 96*  CO2 19*   < > 23 20* 26  GLUCOSE 101*   < > 78 78 104*  BUN 85*   < > 40* 49* 22  CREATININE 15.27*   < > 10.36* 11.57* 6.18*  CALCIUM 8.7*   < > 8.9 8.5* 8.6*  PHOS 7.3*  --   --  6.6*  --    < > = values in this interval not displayed.   CBC: Recent Labs  Lab 01/22/20 0650 01/22/20 0650 01/22/20 1800 01/22/20 1800 01/23/20 0222 01/24/20 0507 01/25/20 0047  WBC 13.3*   < > 9.3   < > 8.5 9.2 8.2  HGB 8.7*   < > 7.6*   < > 7.4* 6.6* 9.4*  HCT 28.7*   < > 23.4*   < > 24.1* 20.7* 28.1*  MCV 101.8*  --  101.3*  --  99.2 97.2 90.4  PLT 302   < > 277   < > 221 225 201   < > = values in this interval not displayed.   Blood Culture    Component Value Date/Time   SDES URINE, CATHETERIZED 04/12/2011 0335   SPECREQUEST NONE 04/12/2011 0335   CULT ESCHERICHIA COLI 04/12/2011 0335   REPTSTATUS 04/14/2011 FINAL 04/12/2011 0335     Physical Exam General: WNWD woman, conversant nad  Heart: RRR Lungs: Clear bilaterally  Abdomen: obese, non-tender  Extremities: 2+ pitting LE edema, bilaterally  Dialysis Access: R IJ TDC   Medications:  . sodium chloride   Intravenous Once  . sodium chloride   Intravenous Once  . calcium acetate  667 mg Oral TID with meals  . Chlorhexidine  Gluconate Cloth  6 each Topical Q0600  . cinacalcet  90 mg Oral QHS  . darbepoetin (ARANESP) injection - DIALYSIS  100 mcg Intravenous Q Wed-HD  . ferric citrate  420 mg Oral TID with meals  . fluticasone  1 spray Each Nare Daily  . folic acid  1 mg Oral Daily  . gabapentin  100 mg Oral BID  . loratadine  10 mg Oral Daily  . midodrine  10 mg Oral Q T,Th,Sa-HD  . nystatin  1 application Topical BID  . pantoprazole (PROTONIX) IV  40 mg Intravenous Q12H  . sertraline  150 mg Oral Daily  . sodium chloride flush  3 mL Intravenous Q12H  . sodium zirconium cyclosilicate  10 g Oral Daily    Dialysis Orders:  TTS [facility], 3:45h, 160 NR, 400/800, 1k/2.5Ca, EDW 82kg CVC Mircera 100 q2wks , venofer 50 weekly Last HD 10/30 - then 10/23, 10/16.  Post wt 81.6kg.    Assessment/Plan: **Hyperkalemia:   Generally running on 1K outpt.   Low K diet.  Lokelma 10 daily here for now.  Resolved with HD.   **ESRD on HD:   Usual TTS. Off schedule d/t emergent needs/ high inpatient dialysis census. HD off schedule 11/10. Back on schedule today CM Looking into alt living situations as she says she can't make it down from 2nd story apt to go to HD.    **Volume: Significant volume on exam. Only 2L UF yesterday. BP appears stable. On midodrine for BP support. Attempt 4L UF today.   **Anemia:  possible GIB:  Hb 10/30 at HD 8.8. Trending down here 8.7 >7.4  >6.6 with +hemoccult stool.  GI w/u per primary. Hgb 9.4 s/p 2 u prbcs 11/10. Aranesp 100 on 11/10   **Secondary hyperPTH: 10/23 outpt PTH 339, phos 6.4, Ca 7.8.  Not on VDRA. Cont [place].  **HTN:  Not on antiHTN outpt.  Monitor with UF.  Midodrine actually on med list.    **Dispo: PT recommending SNF   Hailee Hollick Larina Earthly PA-C [facility] 01/25/2020,10:24 AM

## 2020-01-25 NOTE — Progress Notes (Signed)
PROGRESS NOTE    Mary Wilcox  ZOX:[id number] DOB: [birth year] DOA: 01/22/2020 PCP: Kristen Loader, FNP   Brief Narrative: 64 year old with past medical history significant for PVD, ESRD on hemodialysis, hypothyroidism, remote cervical cancer status post external radiation presented after missing hemodialysis.  Patient lives in an upstairs apartment and uses a walker to ambulate and she also uses a wheelchair when leaving the apartment.  She has called EMS to be transported to hemodialysis.  She has been weak and unable to ambulate for this reason, so she stopped going to hemodialysis.  When EMS arrived she was covered in feces and bedbugs. Evaluation in the ED: She is not able to go to her outpatient hemodialysis because she is not able to go up and down the stairs.  She has worsening of her chronic diarrhea.  Hemoglobin has dropped to 8.5 from 11-12.  Patient with a potassium of 7.5.  Received calcium, insulin and glucose in the ED. -11/10: hb down to 6.8, 2 units PRBC ordered  Assessment & Plan:  Hyperkalemia: Volume overload ESRD on hemodialysis -Due to missed HD -Patient received calcium gluconate, insulin and glucose and Lokelma. -Corrected with hemodialysis. -PT OT eval completed, SNF recommended  Acute on chronic anemia Anemia of chronic disease and heme positive stools -Hemoglobin down to 6.6 on 11/10, transfused 2 units of PRBC -Hemoccult positive, patient reports intermittent history of dark stools which she attributed to iron and binders -Seen by gastroenterology on admission, recommended infectious work-up, diarrhea has pretty much resolved C. difficile PCR is positive for antigen and negative for toxin, maybe  more consistent with colonization than true infection, diarrhea has resolved, if recurs will start PO Vancomycin x10days -Discussed with gastroenterology, patient's lack of social support and disease burden limit ability to pursue much outpatient work-up, Dr.Outlaw  considering EGD possibly on Monday  Hypothyroidism: Continue with Synthroid  Acute on chronic diarrhea: -resolved, C. difficile PCR positive for antigen but negative for toxin which is likely secondary to colonization , if diarrhea recurs will Rx as infection   Obesity; -Needs weight loss/lifestyle modification  Chronic hypotension: Continue with midodrine.   Pressure Injury 04/04/19 Buttocks Bilateral;Mid Stage 1 -  Intact skin with non-blanchable redness of a localized area usually over a bony prominence. (Active)  04/04/19 0039  Location: Buttocks  Location Orientation: Bilateral;Mid  Staging: Stage 1 -  Intact skin with non-blanchable redness of a localized area usually over a bony prominence.  Wound Description (Comments):   Present on Admission: Yes     Pressure Injury 01/23/20 Buttocks Right;Other (Comment) Stage 2 -  Partial thickness loss of dermis presenting as a shallow open injury with a red, pink wound bed without slough. (Active)  01/23/20 0000  Location: Buttocks  Location Orientation: Right;Other (Comment)  Staging: Stage 2 -  Partial thickness loss of dermis presenting as a shallow open injury with a red, pink wound bed without slough.  Wound Description (Comments):   Present on Admission: Yes     Pressure Injury 01/23/20 Buttocks Left Stage 2 -  Partial thickness loss of dermis presenting as a shallow open injury with a red, pink wound bed without slough. (Active)  01/23/20 0000  Location: Buttocks  Location Orientation: Left  Staging: Stage 2 -  Partial thickness loss of dermis presenting as a shallow open injury with a red, pink wound bed without slough.  Wound Description (Comments):   Present on Admission: Yes     Estimated body mass index is 40.03 kg/m as  calculated from the following:   Height as of this encounter: 4\' 11"  (1.499 m).   Weight as of this encounter: 89.9 kg.   DVT prophylaxis: SCDs Code Status: Full code Family Communication:  Discussed with patient Disposition Plan:  Status is: Inpatient  Remains inpatient appropriate because:Ongoing diagnostic testing needed not appropriate for outpatient work up   Dispo: The patient is from: Home              Anticipated d/c is to: SNF              Anticipated d/c date is: 3-4days              Patient currently is not medically stable to d/c.   Consultants:   Nephrology  Procedures:   Hemodialysis  Antimicrobials:    Subjective: -no diarrhea today, NO N/V  Objective: Vitals:   01/24/20 1645 01/24/20 2030 01/25/20 0429 01/25/20 0936  BP: (!) 154/60 (!) 170/68 (!) 168/70 (!) 176/69  Pulse: 65 68 68 69  Resp: 18 18 18 20   Temp: 98.7 F (37.1 C) 99.4 F (37.4 C) 99.2 F (37.3 C) 99.3 F (37.4 C)  TempSrc: Oral Oral Oral Oral  SpO2: 97% 97% 95% 95%  Weight:  89.9 kg    Height:        Intake/Output Summary (Last 24 hours) at 01/25/2020 1113 Last data filed at 01/25/2020 0900 Gross per 24 hour  Intake 1095 ml  Output 2000 ml  Net -905 ml   Filed Weights   01/23/20 2112 01/24/20 1035 01/24/20 2030  Weight: 91.9 kg 90.5 kg 89.9 kg    Examination:  General exam: Chronically ill obese female sitting up in bed, AAOx3, no distress HEENT: Right IJ HD catheter noted CVS: S1-S2, regular rate rhythm Lungs: Decreased breath sounds the bases Abdomen: Soft, nontender, bowel sounds present Extremities: 2+ edema Skin: No rashes on exposed skin   Data Reviewed: I have personally reviewed following labs and imaging studies  CBC: Recent Labs  Lab 01/22/20 0650 01/22/20 1800 01/23/20 0222 01/24/20 0507 01/25/20 0047  WBC 13.3* 9.3 8.5 9.2 8.2  HGB 8.7* 7.6* 7.4* 6.6* 9.4*  HCT 28.7* 23.4* 24.1* 20.7* 28.1*  MCV 101.8* 101.3* 99.2 97.2 90.4  PLT 302 277 221 225 494   Basic Metabolic Panel: Recent Labs  Lab 01/22/20 0650 01/22/20 1139 01/23/20 0222 01/24/20 0507 01/25/20 0047  NA 135 136 136 131* 134*  K >7.5* 5.6* 4.9 5.0 3.9  CL 96* 97*  98 93* 96*  CO2 14* 19* 23 20* 26  GLUCOSE 87 101* 78 78 104*  BUN 101* 85* 40* 49* 22  CREATININE 17.80* 15.27* 10.36* 11.57* 6.18*  CALCIUM 8.6* 8.7* 8.9 8.5* 8.6*  PHOS  --  7.3*  --  6.6*  --    GFR: Estimated Creatinine Clearance: 8.9 mL/min (A) (by C-G formula based on SCr of 6.18 mg/dL (H)). Liver Function Tests: Recent Labs  Lab 01/22/20 1139 01/24/20 0507  ALBUMIN 2.9* 2.4*   No results for input(s): LIPASE, AMYLASE in the last 168 hours. No results for input(s): AMMONIA in the last 168 hours. Coagulation Profile: No results for input(s): INR, PROTIME in the last 168 hours. Cardiac Enzymes: No results for input(s): CKTOTAL, CKMB, CKMBINDEX, TROPONINI in the last 168 hours. BNP (last 3 results) No results for input(s): PROBNP in the last 8760 hours. HbA1C: No results for input(s): HGBA1C in the last 72 hours. CBG: Recent Labs  Lab 01/23/20 0026  GLUCAP 69*  Lipid Profile: No results for input(s): CHOL, HDL, LDLCALC, TRIG, CHOLHDL, LDLDIRECT in the last 72 hours. Thyroid Function Tests: Recent Labs    01/24/20 0507  TSH 3.249   Anemia Panel: Recent Labs    01/23/20 0808  VITAMINB12 4,639*  FOLATE 4.7*  FERRITIN 527*  TIBC 154*  IRON 56  RETICCTPCT 2.0   Sepsis Labs: No results for input(s): PROCALCITON, LATICACIDVEN in the last 168 hours.  Recent Results (from the past 240 hour(s))  Respiratory Panel by RT PCR (Flu A&B, Covid) - Nasopharyngeal Swab     Status: None   Collection Time: 01/22/20  8:34 AM   Specimen: Nasopharyngeal Swab  Result Value Ref Range Status   SARS Coronavirus 2 by RT PCR NEGATIVE NEGATIVE Final    Comment: (NOTE) SARS-CoV-2 target nucleic acids are NOT DETECTED.  The SARS-CoV-2 RNA is generally detectable in upper respiratoy specimens during the acute phase of infection. The lowest concentration of SARS-CoV-2 viral copies this assay can detect is 131 copies/mL. A negative result does not preclude SARS-Cov-2 infection  and should not be used as the sole basis for treatment or other patient management decisions. A negative result may occur with  improper specimen collection/handling, submission of specimen other than nasopharyngeal swab, presence of viral mutation(s) within the areas targeted by this assay, and inadequate number of viral copies (<131 copies/mL). A negative result must be combined with clinical observations, patient history, and epidemiological information. The expected result is Negative.  Fact Sheet for Patients:  PinkCheek.be  Fact Sheet for Healthcare Providers:  GravelBags.it  This test is no t yet approved or cleared by the [place] FDA and  has been authorized for detection and/or diagnosis of SARS-CoV-2 by FDA under an Emergency Use Authorization (EUA). This EUA will remain  in effect (meaning this test can be used) for the duration of the COVID-19 declaration under Section 564(b)(1) of the Act, 21 U.S.C. section 360bbb-3(b)(1), unless the authorization is terminated or revoked sooner.     Influenza A by PCR NEGATIVE NEGATIVE Final   Influenza B by PCR NEGATIVE NEGATIVE Final    Comment: (NOTE) The Xpert Xpress SARS-CoV-2/FLU/RSV assay is intended as an aid in  the diagnosis of influenza from Nasopharyngeal swab specimens and  should not be used as a sole basis for treatment. Nasal washings and  aspirates are unacceptable for Xpert Xpress SARS-CoV-2/FLU/RSV  testing.  Fact Sheet for Patients: PinkCheek.be  Fact Sheet for Healthcare Providers: GravelBags.it  This test is not yet approved or cleared by the [place] FDA and  has been authorized for detection and/or diagnosis of SARS-CoV-2 by  FDA under an Emergency Use Authorization (EUA). This EUA will remain  in effect (meaning this test can be used) for the duration of the  Covid-19 declaration  under Section 564(b)(1) of the Act, 21  U.S.C. section 360bbb-3(b)(1), unless the authorization is  terminated or revoked. Performed at [facility], [place] [address] [place], [place] [address]   C Difficile Quick Screen w PCR reflex     Status: Abnormal   Collection Time: 01/22/20  9:39 AM   Specimen: STOOL  Result Value Ref Range Status   C Diff antigen POSITIVE (A) NEGATIVE Final   C Diff toxin NEGATIVE NEGATIVE Final   C Diff interpretation Results are indeterminate. See PCR results.  Final    Comment: Performed at [facility], [place] [address] [place], [place] [address]  C. Diff by PCR, Reflexed  Status: None   Collection Time: 01/22/20  9:39 AM  Result Value Ref Range Status   Toxigenic C. Difficile by PCR NEGATIVE NEGATIVE Final    Comment: Patient is colonized with non toxigenic C. difficile. May not need treatment unless significant symptoms are present. Performed at [facility], [place] [address] [place], [place] [address]      Scheduled Meds: . sodium chloride   Intravenous Once  . sodium chloride   Intravenous Once  . calcium acetate  667 mg Oral TID with meals  . Chlorhexidine Gluconate Cloth  6 each Topical Q0600  . Chlorhexidine Gluconate Cloth  6 each Topical Q0600  . cinacalcet  90 mg Oral QHS  . darbepoetin (ARANESP) injection - DIALYSIS  100 mcg Intravenous Q Wed-HD  . ferric citrate  420 mg Oral TID with meals  . fluticasone  1 spray Each Nare Daily  . folic acid  1 mg Oral Daily  . gabapentin  100 mg Oral BID  . loratadine  10 mg Oral Daily  . midodrine  10 mg Oral Q T,Th,Sa-HD  . nystatin  1 application Topical BID  . pantoprazole (PROTONIX) IV  40 mg Intravenous Q12H  . sertraline  150 mg Oral Daily  . sodium chloride flush  3 mL Intravenous Q12H  . sodium zirconium cyclosilicate  10 g Oral Daily   Continuous Infusions:   LOS: 3 days    Time spent: 35 minutes  Domenic Polite, MD [facility]  01/25/2020, 11:13 AM

## 2020-01-25 NOTE — Care Management Important Message (Signed)
Important Message  Patient Details  Name: KARRISA DIDIO MRN: [id number] Date of Birth: [birth year]   Medicare Important Message Given:  Yes - Important Message mailed due to current National Emergency  Verbal consent obtained due to current National Emergency  Relationship to patient: Self Contact Name: Rodneshia Greenhouse Call Date: 01/25/20  Time: 1344 Phone: [phone] Outcome: No Answer/Busy Important Message mailed to: Patient address on file    Delorse Lek 01/25/2020, 1:44 PM

## 2020-01-25 NOTE — Progress Notes (Signed)
PT Cancellation Note  Patient Details Name: ZALA DEGRASSE MRN: [id number] DOB: January 08, 1956   Cancelled Treatment:    Reason Eval/Treat Not Completed: Patient at procedure or test/unavailable at HD, not available to work with therapy. Will attempt to return if time/schedule allow.    Windell Norfolk, DPT, PN1   Supplemental Physical Therapist [facility]    Pager [phone] Acute Rehab Office [phone]

## 2020-01-26 DIAGNOSIS — E875 Hyperkalemia: Secondary | ICD-10-CM | POA: Diagnosis not present

## 2020-01-26 LAB — CBC
HCT: 30 % — ABNORMAL LOW (ref 36.0–46.0)
Hemoglobin: 9.9 g/dL — ABNORMAL LOW (ref 12.0–15.0)
MCH: 30.3 pg (ref 26.0–34.0)
MCHC: 33 g/dL (ref 30.0–36.0)
MCV: 91.7 fL (ref 80.0–100.0)
Platelets: 173 10*3/uL (ref 150–400)
RBC: 3.27 MIL/uL — ABNORMAL LOW (ref 3.87–5.11)
RDW: 17.2 % — ABNORMAL HIGH (ref 11.5–15.5)
WBC: 8.7 10*3/uL (ref 4.0–10.5)
nRBC: 0.2 % (ref 0.0–0.2)

## 2020-01-26 LAB — BASIC METABOLIC PANEL
Anion gap: 13 (ref 5–15)
Anion gap: 12 (ref 5–15)
BUN: 12 mg/dL (ref 8–23)
BUN: 22 mg/dL (ref 8–23)
CO2: 26 mmol/L (ref 22–32)
CO2: 26 mmol/L (ref 22–32)
Calcium: 9 mg/dL (ref 8.9–10.3)
Calcium: 8.6 mg/dL — ABNORMAL LOW (ref 8.9–10.3)
Chloride: 96 mmol/L — ABNORMAL LOW (ref 98–111)
Chloride: 96 mmol/L — ABNORMAL LOW (ref 98–111)
Creatinine, Ser: 4.32 mg/dL — ABNORMAL HIGH (ref 0.44–1.00)
Creatinine, Ser: 6.18 mg/dL — ABNORMAL HIGH (ref 0.44–1.00)
GFR, Estimated: 11 mL/min — ABNORMAL LOW (ref >60)
GFR, Estimated: 7 mL/min — ABNORMAL LOW (ref >60)
Glucose, Bld: 112 mg/dL — ABNORMAL HIGH (ref 70–99)
Glucose, Bld: 104 mg/dL — ABNORMAL HIGH (ref 70–99)
Potassium: 3.7 mmol/L (ref 3.5–5.1)
Potassium: 3.9 mmol/L (ref 3.5–5.1)
Sodium: 135 mmol/L (ref 135–145)
Sodium: 134 mmol/L — ABNORMAL LOW (ref 135–145)

## 2020-01-26 MED ORDER — PEG 3350-KCL-NA BICARB-NACL 420 G PO SOLR
4000.0000 mL | Freq: Once | ORAL | Status: DC
  Filled 2020-01-26: qty 4000

## 2020-01-26 MED ORDER — LABETALOL HCL 5 MG/ML IV SOLN
10.0000 mg | INTRAVENOUS | Status: DC | PRN
  Administered 2020-01-29 (×2): 10 mg via INTRAVENOUS
  Filled 2020-01-26: qty 4

## 2020-01-26 MED ORDER — CHLORHEXIDINE GLUCONATE CLOTH 2 % EX PADS
6.0000 | MEDICATED_PAD | Freq: Every day | CUTANEOUS | Status: DC
  Administered 2020-01-27 – 2020-01-30 (×4): 6 via TOPICAL

## 2020-01-26 NOTE — Progress Notes (Signed)
[facility] Progress Note  Mary Wilcox 64 y.o. 10/05/1955  CC:  Anemia  Subjective: Patient reports feeling better since admission.  Denies any abdominal pain, nausea, or vomiting.  Reports chronically black stools, which she attributes to iron supplementation.  ROS : Review of Systems  Cardiovascular: Negative for chest pain and palpitations.  Gastrointestinal: Positive for melena. Negative for abdominal pain, blood in stool, constipation, diarrhea, heartburn, nausea and vomiting.   Objective: Vital signs in last 24 hours: Vitals:   01/26/20 0727 01/26/20 0812  BP: (!) 164/68 (!) 161/62  Pulse: 79   Resp:    Temp: 98.7 F (37.1 C)   SpO2: 98%     Physical Exam:  General:  Alert, oriented, cooperative, no distress  Head:  Normocephalic, without obvious abnormality, atraumatic  Eyes:  Anicteric sclera, EOMs intact  Lungs:   Clear to auscultation bilaterally, respirations unlabored  Heart:  Regular rate and rhythm, S1, S2 normal  Abdomen:   Soft, non-tender, non-distended, bowel sounds active all four quadrants, no guarding or peritoneal signs  Extremities: Extremities normal, atraumatic, no  edema  Pulses: 2+ and symmetric    Lab Results: Recent Labs    01/24/20 0507 01/24/20 0507 01/25/20 0047 01/26/20 0119  NA 131*   < > 134* 135  K 5.0   < > 3.9 3.7  CL 93*   < > 96* 96*  CO2 20*   < > 26 26  GLUCOSE 78   < > 104* 112*  BUN 49*   < > 22 12  CREATININE 11.57*   < > 6.18* 4.32*  CALCIUM 8.5*   < > 8.6* 9.0  PHOS 6.6*  --   --   --    < > = values in this interval not displayed.   Recent Labs    01/24/20 0507  ALBUMIN 2.4*   Recent Labs    01/25/20 0047 01/26/20 0119  WBC 8.2 8.7  HGB 9.4* 9.9*  HCT 28.1* 30.0*  MCV 90.4 91.7  PLT 201 173   No results for input(s): LABPROT, INR in the last 72 hours.    Assessment: Anemia and heme positive stools: anemia is likely multifactorial.  Anemia of chronic disease, recent bed bug  infestation, +/- GI source. -Hemoglobin decreased to 6.6 on 11/10 from 7.4.  Now stable at 9.9 after 2u pRBCs -No frank melena or hematochezia -No prior EGD/colonoscopy  ESRD on HD: BUN 12/Cr 4.32, next HD tomorrow  Plan: Clear liquid diet starting tomorrow morning.  NuLytely prep tomorrow.  Plan for EGD and colonoscopy on Sunday 11/14 with Dr. Paulita Fujita.  I thoroughly discussed the procedures with the patient to include nature, alternatives, benefits, and risks (including but not limited to bleeding, infection, perforation, anesthesia/cardiac and pulmonary complications).  Patient verbalized understanding and gave verbal consent to proceed with EGD and colonoscopy.  Continue Protonix 40 mg IV twice daily.  Continue to monitor H&H with transfusion as needed to maintain Hgb >7.  [facility] will follow.  Salley Slaughter PA-C 01/26/2020, 8:46 AM  Contact #  [phone]

## 2020-01-26 NOTE — Progress Notes (Addendum)
[facility] Progress Note   Subjective:   Patient seen and examined at bedside.  Reports swelling in her legs and arms is improving.  Continues to have dry cough.  Mild abdominal pain.  Tolerating diet.  Denies n/v/d, CP, SOB, edema, palpitations, weakness, dizziness and fatigue.   Objective Vitals:   01/26/20 0529 01/26/20 0642 01/26/20 0727 01/26/20 0812  BP: (!) 180/81 (!) 179/56 (!) 164/68 (!) 161/62  Pulse: 87 72 79   Resp: 17     Temp: 98.3 F (36.8 C)  98.7 F (37.1 C)   TempSrc: Oral  Oral   SpO2: 96%  98%   Weight:      Height:       Physical Exam General:WDWN female in NAD Heart:RRR Lungs:mostly CTAB, +expiratory wheeze Abdomen:soft, ND, +tenderness to palpation in LLQ Extremities: 2+ pitting upper and lower extremity edema b/l Dialysis Access: R IJ [facility]   Filed Weights   01/24/20 2030 01/25/20 1150 01/25/20 1551  Weight: 89.9 kg 87.5 kg 83.2 kg    Intake/Output Summary (Last 24 hours) at 01/26/2020 1141 Last data filed at 01/26/2020 0900 Gross per 24 hour  Intake 440 ml  Output 4000 ml  Net -3560 ml    Additional Objective Labs: Basic Metabolic Panel: Recent Labs  Lab 01/22/20 1139 01/23/20 0222 01/24/20 0507 01/25/20 0047 01/26/20 0119  NA 136   < > 131* 134* 135  K 5.6*   < > 5.0 3.9 3.7  CL 97*   < > 93* 96* 96*  CO2 19*   < > 20* 26 26  GLUCOSE 101*   < > 78 104* 112*  BUN 85*   < > 49* 22 12  CREATININE 15.27*   < > 11.57* 6.18* 4.32*  CALCIUM 8.7*   < > 8.5* 8.6* 9.0  PHOS 7.3*  --  6.6*  --   --    < > = values in this interval not displayed.   Liver Function Tests: Recent Labs  Lab 01/22/20 1139 01/24/20 0507  ALBUMIN 2.9* 2.4*   CBC: Recent Labs  Lab 01/22/20 1800 01/22/20 1800 01/23/20 0222 01/23/20 0222 01/24/20 0507 01/25/20 0047 01/26/20 0119  WBC 9.3   < > 8.5   < > 9.2 8.2 8.7  HGB 7.6*   < > 7.4*   < > 6.6* 9.4* 9.9*  HCT 23.4*   < > 24.1*   < > 20.7* 28.1* 30.0*  MCV 101.3*  --  99.2  --  97.2  90.4 91.7  PLT 277   < > 221   < > 225 201 173   < > = values in this interval not displayed.   CBG: Recent Labs  Lab 01/23/20 0026  GLUCAP 69*    Medications:  . sodium chloride   Intravenous Once  . sodium chloride   Intravenous Once  . calcium acetate  667 mg Oral TID with meals  . Chlorhexidine Gluconate Cloth  6 each Topical Q0600  . Chlorhexidine Gluconate Cloth  6 each Topical Q0600  . cinacalcet  90 mg Oral QHS  . darbepoetin (ARANESP) injection - DIALYSIS  100 mcg Intravenous Q Wed-HD  . ferric citrate  420 mg Oral TID with meals  . fluticasone  1 spray Each Nare Daily  . folic acid  1 mg Oral Daily  . gabapentin  100 mg Oral BID  . loratadine  10 mg Oral Daily  . midodrine  10 mg Oral Q T,Th,Sa-HD  . nystatin  1 application Topical BID  . pantoprazole (PROTONIX) IV  40 mg Intravenous Q12H  . [START ON 01/27/2020] polyethylene glycol-electrolytes  4,000 mL Oral Once  . sertraline  150 mg Oral Daily  . sodium chloride flush  3 mL Intravenous Q12H  . sodium zirconium cyclosilicate  10 g Oral Daily    Dialysis Orders: TTS [facility], 3:45h, 160 NR, 400/800, 1k/2.5Ca, EDW 82kg CVC Mircera 100 q2wks , venofer 50 weekly Last HD 10/30 - then 10/23, 10/16. Post wt 81.6kg.  Assessment/Plan: 1. Hyperkalemia - Generally running on 1K as outpt.  Low K diet. Lokelma 10 daily here for now.  Resolved with HD. K 3.7 today. 2. Volume overload - significant volume overload on admit.  Closer to dry weight post dialysis yesterday with net UF 4L removed.  Continues to have significant LE edema.  Plan for UF 4L again tomorrow.  Will need lower dry weight on discharge. 3. ?GIB - +hemoccult. GI agreed to inpatient work up due to pt poor social situation.  Plan for EGD/colonoscopy Sunday.  4. ESRD - on HD TTS.  Has been off schedule d/t emergent needs/high IP dialysis census but returned to normal schedule yesterday.  Next HD tomorrow 11/13.  Missing HD d/t not being able to make it  from 2nd story apartment per pt, CM/SW working on other arrangements, ?SNF. 5. Anemia of CKD - Hgb 9.9 s/p 1 unit pRBC on 11/10. Dropped to 6.6 from 8.7 after admission likely d/t #2.  tsat 36%. Cont Aranesp 100 qwk.  6. Secondary hyperparathyroidism - outpt PTH 339 on 10/23. Ca in goal.  Last phos elevated.  Continue binders and sensipar.  7. HTN - BP elevated.  On midodrine for BP support as OP with HD.  Remains volume overloaded.    8. Nutrition - Renal diet w/fluid restrictions.  9. Dispo - PT recommending SNF  Jen Mow, PA-C [facility] 01/26/2020,11:41 AM  LOS: 4 days

## 2020-01-26 NOTE — Progress Notes (Signed)
PROGRESS NOTE    Mary Wilcox  YHC:[id number] DOB: [birth year] DOA: 01/22/2020 PCP: Kristen Loader, FNP   Brief Narrative: 64 year old with past medical history significant for PVD, ESRD on hemodialysis, hypothyroidism, remote cervical cancer status post external radiation presented after missing hemodialysis.  Patient lives in an upstairs apartment and uses a walker to ambulate and she also uses a wheelchair when leaving the apartment.  She has called EMS to be transported to hemodialysis.  She has been weak and unable to ambulate for this reason, so she stopped going to hemodialysis.  When EMS arrived she was covered in feces and bedbugs. Evaluation in the ED: She is not able to go to her outpatient hemodialysis because she is not able to go up and down the stairs.  She has worsening of her chronic diarrhea.  Hemoglobin has dropped to 8.5 from 11-12.  Patient with a potassium of 7.5.  Received calcium, insulin and glucose in the ED. -11/10: hb down to 6.8, 2 units PRBC ordered  Assessment & Plan:  Hyperkalemia: Volume overload ESRD on hemodialysis -Due to missed HD -Patient received calcium gluconate, insulin and glucose and Lokelma. -Corrected with hemodialysis. -PT OT eval completed, SNF recommended  Acute on chronic anemia Anemia of chronic disease and heme positive stools -Hemoglobin down to 6.6 on 11/10, transfused 2 units of PRBC -Hemoccult positive, patient reports intermittent history of dark stools which she attributed to iron and binders -Seen by gastroenterology on admission, recommended infectious work-up, diarrhea has pretty much resolved C. difficile PCR is positive for antigen and negative for toxin, diarrhea has resolved hence suspect this is more consistent with colonization -If diarrhea recurs will start PO Vancomycin x10days -Discussed with gastroenterology, patient's lack of social support and disease burden limit ability to pursue much outpatient work-up,  Dr.Outlaw considering EGD/colonoscopy on Monday -Monitor hemoglobin  Hypothyroidism: Continue with Synthroid  Acute on chronic diarrhea: -resolved, C. difficile PCR positive for antigen but negative for toxin which is likely secondary to colonization , if diarrhea recurs will Rx as infection   Obesity; -Needs weight loss/lifestyle modification  Chronic hypotension: Continue with midodrine.   Pressure Injury 04/04/19 Buttocks Bilateral;Mid Stage 1 -  Intact skin with non-blanchable redness of a localized area usually over a bony prominence. (Active)  04/04/19 0039  Location: Buttocks  Location Orientation: Bilateral;Mid  Staging: Stage 1 -  Intact skin with non-blanchable redness of a localized area usually over a bony prominence.  Wound Description (Comments):   Present on Admission: Yes     Pressure Injury 01/23/20 Buttocks Right;Other (Comment) Stage 2 -  Partial thickness loss of dermis presenting as a shallow open injury with a red, pink wound bed without slough. (Active)  01/23/20 0000  Location: Buttocks  Location Orientation: Right;Other (Comment)  Staging: Stage 2 -  Partial thickness loss of dermis presenting as a shallow open injury with a red, pink wound bed without slough.  Wound Description (Comments):   Present on Admission: Yes     Pressure Injury 01/23/20 Buttocks Left Stage 2 -  Partial thickness loss of dermis presenting as a shallow open injury with a red, pink wound bed without slough. (Active)  01/23/20 0000  Location: Buttocks  Location Orientation: Left  Staging: Stage 2 -  Partial thickness loss of dermis presenting as a shallow open injury with a red, pink wound bed without slough.  Wound Description (Comments):   Present on Admission: Yes     Estimated body mass index is 37.05 kg/m  as calculated from the following:   Height as of this encounter: 4\' 11"  (1.499 m).   Weight as of this encounter: 83.2 kg.   DVT prophylaxis: SCDs Code Status: Full  code Family Communication: Discussed with patient Disposition Plan:  Status is: Inpatient  Remains inpatient appropriate because:Ongoing diagnostic testing needed not appropriate for outpatient work up   Dispo: The patient is from: Home              Anticipated d/c is to: SNF              Anticipated d/c date is: 3-4days              Patient currently is not medically stable to d/c.   Consultants:   Nephrology, GI  Procedures:   Hemodialysis  Antimicrobials:    Subjective: -Feels little better overall, swelling improving, breathing better, no diarrhea today or yesterday  Objective: Vitals:   01/26/20 0529 01/26/20 0642 01/26/20 0727 01/26/20 0812  BP: (!) 180/81 (!) 179/56 (!) 164/68 (!) 161/62  Pulse: 87 72 79   Resp: 17     Temp: 98.3 F (36.8 C)  98.7 F (37.1 C)   TempSrc: Oral  Oral   SpO2: 96%  98%   Weight:      Height:        Intake/Output Summary (Last 24 hours) at 01/26/2020 1145 Last data filed at 01/26/2020 0900 Gross per 24 hour  Intake 440 ml  Output 4000 ml  Net -3560 ml   Filed Weights   01/24/20 2030 01/25/20 1150 01/25/20 1551  Weight: 89.9 kg 87.5 kg 83.2 kg    Examination:  General exam: Obese chronically ill female sitting up in bed, AAOx3, no distress HEENT: Right IJ HD catheter noted CVS: S1-S2, regular rate rhythm Lungs: Distant breath sounds, decreased at the bases Abdomen: Soft, nontender, bowel sounds present Extremities: 1+ edema  Skin: No rashes on exposed skin   Data Reviewed: I have personally reviewed following labs and imaging studies  CBC: Recent Labs  Lab 01/22/20 1800 01/23/20 0222 01/24/20 0507 01/25/20 0047 01/26/20 0119  WBC 9.3 8.5 9.2 8.2 8.7  HGB 7.6* 7.4* 6.6* 9.4* 9.9*  HCT 23.4* 24.1* 20.7* 28.1* 30.0*  MCV 101.3* 99.2 97.2 90.4 91.7  PLT 277 221 225 201 696   Basic Metabolic Panel: Recent Labs  Lab 01/22/20 1139 01/23/20 0222 01/24/20 0507 01/25/20 0047 01/26/20 0119  NA 136 136  131* 134* 135  K 5.6* 4.9 5.0 3.9 3.7  CL 97* 98 93* 96* 96*  CO2 19* 23 20* 26 26  GLUCOSE 101* 78 78 104* 112*  BUN 85* 40* 49* 22 12  CREATININE 15.27* 10.36* 11.57* 6.18* 4.32*  CALCIUM 8.7* 8.9 8.5* 8.6* 9.0  PHOS 7.3*  --  6.6*  --   --    GFR: Estimated Creatinine Clearance: 12 mL/min (A) (by C-G formula based on SCr of 4.32 mg/dL (H)). Liver Function Tests: Recent Labs  Lab 01/22/20 1139 01/24/20 0507  ALBUMIN 2.9* 2.4*   No results for input(s): LIPASE, AMYLASE in the last 168 hours. No results for input(s): AMMONIA in the last 168 hours. Coagulation Profile: No results for input(s): INR, PROTIME in the last 168 hours. Cardiac Enzymes: No results for input(s): CKTOTAL, CKMB, CKMBINDEX, TROPONINI in the last 168 hours. BNP (last 3 results) No results for input(s): PROBNP in the last 8760 hours. HbA1C: No results for input(s): HGBA1C in the last 72 hours. CBG: Recent Labs  Lab 01/23/20 0026  GLUCAP 69*   Lipid Profile: No results for input(s): CHOL, HDL, LDLCALC, TRIG, CHOLHDL, LDLDIRECT in the last 72 hours. Thyroid Function Tests: Recent Labs    01/24/20 0507  TSH 3.249   Anemia Panel: No results for input(s): VITAMINB12, FOLATE, FERRITIN, TIBC, IRON, RETICCTPCT in the last 72 hours. Sepsis Labs: No results for input(s): PROCALCITON, LATICACIDVEN in the last 168 hours.  Recent Results (from the past 240 hour(s))  Respiratory Panel by RT PCR (Flu A&B, Covid) - Nasopharyngeal Swab     Status: None   Collection Time: 01/22/20  8:34 AM   Specimen: Nasopharyngeal Swab  Result Value Ref Range Status   SARS Coronavirus 2 by RT PCR NEGATIVE NEGATIVE Final    Comment: (NOTE) SARS-CoV-2 target nucleic acids are NOT DETECTED.  The SARS-CoV-2 RNA is generally detectable in upper respiratoy specimens during the acute phase of infection. The lowest concentration of SARS-CoV-2 viral copies this assay can detect is 131 copies/mL. A negative result does not  preclude SARS-Cov-2 infection and should not be used as the sole basis for treatment or other patient management decisions. A negative result may occur with  improper specimen collection/handling, submission of specimen other than nasopharyngeal swab, presence of viral mutation(s) within the areas targeted by this assay, and inadequate number of viral copies (<131 copies/mL). A negative result must be combined with clinical observations, patient history, and epidemiological information. The expected result is Negative.  Fact Sheet for Patients:  PinkCheek.be  Fact Sheet for Healthcare Providers:  GravelBags.it  This test is no t yet approved or cleared by the [place] FDA and  has been authorized for detection and/or diagnosis of SARS-CoV-2 by FDA under an Emergency Use Authorization (EUA). This EUA will remain  in effect (meaning this test can be used) for the duration of the COVID-19 declaration under Section 564(b)(1) of the Act, 21 U.S.C. section 360bbb-3(b)(1), unless the authorization is terminated or revoked sooner.     Influenza A by PCR NEGATIVE NEGATIVE Final   Influenza B by PCR NEGATIVE NEGATIVE Final    Comment: (NOTE) The Xpert Xpress SARS-CoV-2/FLU/RSV assay is intended as an aid in  the diagnosis of influenza from Nasopharyngeal swab specimens and  should not be used as a sole basis for treatment. Nasal washings and  aspirates are unacceptable for Xpert Xpress SARS-CoV-2/FLU/RSV  testing.  Fact Sheet for Patients: PinkCheek.be  Fact Sheet for Healthcare Providers: GravelBags.it  This test is not yet approved or cleared by the [place] FDA and  has been authorized for detection and/or diagnosis of SARS-CoV-2 by  FDA under an Emergency Use Authorization (EUA). This EUA will remain  in effect (meaning this test can be used) for the  duration of the  Covid-19 declaration under Section 564(b)(1) of the Act, 21  U.S.C. section 360bbb-3(b)(1), unless the authorization is  terminated or revoked. Performed at [facility], [place] [address], [place], [place] [address]   Gastrointestinal Panel by PCR , Stool     Status: None   Collection Time: 01/22/20  9:39 AM   Specimen: Stool  Result Value Ref Range Status   Campylobacter species NOT DETECTED NOT DETECTED Final   Plesimonas shigelloides NOT DETECTED NOT DETECTED Final   Salmonella species NOT DETECTED NOT DETECTED Final   Yersinia enterocolitica NOT DETECTED NOT DETECTED Final   Vibrio species NOT DETECTED NOT DETECTED Final   Vibrio cholerae NOT DETECTED NOT DETECTED Final   Enteroaggregative E coli (EAEC) NOT  DETECTED NOT DETECTED Final   Enteropathogenic E coli (EPEC) NOT DETECTED NOT DETECTED Final   Enterotoxigenic E coli (ETEC) NOT DETECTED NOT DETECTED Final   Shiga like toxin producing E coli (STEC) NOT DETECTED NOT DETECTED Final   Shigella/Enteroinvasive E coli (EIEC) NOT DETECTED NOT DETECTED Final   Cryptosporidium NOT DETECTED NOT DETECTED Final   Cyclospora cayetanensis NOT DETECTED NOT DETECTED Final   Entamoeba histolytica NOT DETECTED NOT DETECTED Final   Giardia lamblia NOT DETECTED NOT DETECTED Final   Adenovirus F40/41 NOT DETECTED NOT DETECTED Final   Astrovirus NOT DETECTED NOT DETECTED Final   Norovirus GI/GII NOT DETECTED NOT DETECTED Final   Rotavirus A NOT DETECTED NOT DETECTED Final   Sapovirus (I, II, IV, and V) NOT DETECTED NOT DETECTED Final    Comment: Performed at [facility], [address] [place], [place] [address]  C Difficile Quick Screen w PCR reflex     Status: Abnormal   Collection Time: 01/22/20  9:39 AM   Specimen: STOOL  Result Value Ref Range Status   C Diff antigen POSITIVE (A) NEGATIVE Final   C Diff toxin NEGATIVE NEGATIVE Final   C Diff interpretation Results are indeterminate. See PCR results.   Final    Comment: Performed at [facility], [place] [address], [place], [place] [address]  C. Diff by PCR, Reflexed     Status: None   Collection Time: 01/22/20  9:39 AM  Result Value Ref Range Status   Toxigenic C. Difficile by PCR NEGATIVE NEGATIVE Final    Comment: Patient is colonized with non toxigenic C. difficile. May not need treatment unless significant symptoms are present. Performed at [facility], [place] [address] [place], [place] [address]      Scheduled Meds: . sodium chloride   Intravenous Once  . sodium chloride   Intravenous Once  . calcium acetate  667 mg Oral TID with meals  . Chlorhexidine Gluconate Cloth  6 each Topical Q0600  . Chlorhexidine Gluconate Cloth  6 each Topical Q0600  . cinacalcet  90 mg Oral QHS  . darbepoetin (ARANESP) injection - DIALYSIS  100 mcg Intravenous Q Wed-HD  . ferric citrate  420 mg Oral TID with meals  . fluticasone  1 spray Each Nare Daily  . folic acid  1 mg Oral Daily  . gabapentin  100 mg Oral BID  . loratadine  10 mg Oral Daily  . midodrine  10 mg Oral Q T,Th,Sa-HD  . nystatin  1 application Topical BID  . pantoprazole (PROTONIX) IV  40 mg Intravenous Q12H  . [START ON 01/27/2020] polyethylene glycol-electrolytes  4,000 mL Oral Once  . sertraline  150 mg Oral Daily  . sodium chloride flush  3 mL Intravenous Q12H  . sodium zirconium cyclosilicate  10 g Oral Daily   Continuous Infusions:   LOS: 4 days    Time spent: 25 minutes  Domenic Polite, MD [facility]  01/26/2020, 11:45 AM

## 2020-01-26 NOTE — Progress Notes (Signed)
Occupational Therapy Treatment Patient Details Name: Mary Wilcox MRN: [id number] DOB: 1955-09-21 Today's Date: 01/26/2020    History of present illness 64yo female who has been missing HD sessions as she did not like having to take EMS to get to dialysis. Found covered in feces and bed bugs in her apartment. Admitted with hyperkalemia. PMH R eye cataracts, cervical cancer, CKD, ESRD on HD, PVD   OT comments  Patient supine in bed and agreeable to OT/PT session. Patient progressing well towards OT goals. Completed bed mobility with mod assist +2 today, transfers using RW with mod assist +2.  Attempted grooming at sink standing but poor tolerance and reliance on BUE support limiting ability to engage in standing ADLs, setup when seated.  Remains total assist for LB dressing.   Patient engaging well today, oriented to time and following commands with increased time and cueing.  SNF remains appropriate. Will follow acutely.    Follow Up Recommendations  SNF;Supervision/Assistance - 24 hour    Equipment Recommendations  Other (comment) (TBD )    Recommendations for Other Services      Precautions / Restrictions Precautions Precautions: Fall;Other (comment) Precaution Comments: very weak, poor eccentric control, hx of bedbugs Restrictions Weight Bearing Restrictions: No       Mobility Bed Mobility Overal bed mobility: Needs Assistance Bed Mobility: Supine to Sit     Supine to sit: Mod assist;+2 for physical assistance     General bed mobility comments: patient with increased time to initate and cueing required for sequencing, mod assist +2 for full mgmt of LB and trunk advancement to EOB   Transfers Overall transfer level: Needs assistance Equipment used: Rolling walker (2 wheeled) Transfers: Sit to/from [facility] Sit to Stand: Mod assist;+2 physical assistance;+2 safety/equipment Stand pivot transfers: Min assist;+2 physical assistance;+2  safety/equipment       General transfer comment: mod assist to power up and steady from EOB and recliner, cueing for hand placement; pivoting from EOB to recliner with min assist +2     Balance Overall balance assessment: Needs assistance Sitting-balance support: No upper extremity supported;Feet supported Sitting balance-Leahy Scale: Fair Sitting balance - Comments: statically with min guard when feet are touching.  Poor without support of feet.   Standing balance support: Bilateral upper extremity supported;During functional activity;Single extremity supported Standing balance-Leahy Scale: Poor Standing balance comment: requires UE and external support +2                           ADL either performed or assessed with clinical judgement   ADL Overall ADL's : Needs assistance/impaired     Grooming: Minimal assistance;Set up;Wash/dry face;Brushing hair;Sitting;Standing Grooming Details (indicate cue type and reason): +2 if standing for safety, fatigues easily and completes tasks in sitting (increased assist today for hair to comb out knots)              Lower Body Dressing: Total assistance;+2 for physical assistance;+2 for safety/equipment;Sit to/from stand   Toilet Transfer: Moderate assistance;+2 for physical assistance;+2 for safety/equipment;RW;Stand-pivot Toilet Transfer Details (indicate cue type and reason): simulated to recliner          Functional mobility during ADLs: +2 for physical assistance;+2 for safety/equipment;Moderate assistance General ADL Comments: pt limited by weakness, edema and balance      Vision       Perception     Praxis      Cognition Arousal/Alertness: Awake/alert Behavior During Therapy: WFL for tasks  assessed/performed Overall Cognitive Status: Impaired/Different from baseline Area of Impairment: Following commands;Problem solving                       Following Commands: Follows multi-step commands  consistently;Follows multi-step commands with increased time     Problem Solving: Slow processing;Requires verbal cues General Comments: pt with improved cognition, recognizes therapist and engages appropraitely today. Fatigues easily         Exercises     Shoulder Instructions       General Comments      Pertinent Vitals/ Pain       Pain Assessment: Faces Faces Pain Scale: Hurts little more Pain Location: R LE especially at bruises on knee Pain Descriptors / Indicators: Aching;Sore Pain Intervention(s): Monitored during session;Repositioned;Limited activity within patient's tolerance  Home Living                                          Prior Functioning/Environment              Frequency  Min 2X/week        Progress Toward Goals  OT Goals(current goals can now be found in the care plan section)  Progress towards OT goals: Progressing toward goals  Acute Rehab OT Goals Patient Stated Goal: get stronger, go to rehab OT Goal Formulation: With patient  Plan Discharge plan remains appropriate;Frequency remains appropriate    Co-evaluation    PT/OT/SLP Co-Evaluation/Treatment: Yes Reason for Co-Treatment: To address functional/ADL transfers;For patient/therapist safety PT goals addressed during session: Mobility/safety with mobility OT goals addressed during session: ADL's and self-care      AM-PAC OT "6 Clicks" Daily Activity     Outcome Measure   Help from another person eating meals?: A Little Help from another person taking care of personal grooming?: A Little Help from another person toileting, which includes using toliet, bedpan, or urinal?: A Lot Help from another person bathing (including washing, rinsing, drying)?: A Lot Help from another person to put on and taking off regular upper body clothing?: A Little Help from another person to put on and taking off regular lower body clothing?: Total 6 Click Score: 14    End of  Session Equipment Utilized During Treatment: Rolling walker;Gait belt  OT Visit Diagnosis: Other abnormalities of gait and mobility (R26.89);Muscle weakness (generalized) (M62.81);Pain Pain - Right/Left: Right Pain - part of body: Leg;Knee   Activity Tolerance Patient tolerated treatment well   Patient Left in chair;with call bell/phone within reach;with chair alarm set   Nurse Communication Mobility status;Precautions        Time: 9629-5284 OT Time Calculation (min): 40 min  Charges: OT General Charges $OT Visit: 1 Visit OT Treatments $Self Care/Home Management : 23-37 mins  [place] Pager [phone] Office [phone]    Delight Stare 01/26/2020, 3:14 PM

## 2020-01-26 NOTE — Progress Notes (Signed)
Physical Therapy Treatment Patient Details Name: Mary Wilcox MRN: [id number] DOB: March 06, 1956 Today's Date: 01/26/2020    History of Present Illness 64yo female who has been missing HD sessions as she did not like having to take EMS to get to dialysis. Found covered in feces and bed bugs in her apartment. Admitted with hyperkalemia. PMH R eye cataracts, cervical cancer, CKD, ESRD on HD, PVD    PT Comments    Pt supine in bed on arrival this session.  Pt required encouragement to participate in PT session.  Pt progressing well and able to stand and move steps from bed to recliner.  Continue to recommend rehab in a post acute setting.      Follow Up Recommendations  SNF;Supervision/Assistance - 24 hour     Equipment Recommendations  Rolling walker with 5" wheels;3in1 (PT);Wheelchair (measurements PT);Wheelchair cushion (measurements PT)    Recommendations for Other Services       Precautions / Restrictions Precautions Precautions: Fall;Other (comment) Precaution Comments: very weak, poor eccentric control, hx of bedbugs Restrictions Weight Bearing Restrictions: No    Mobility  Bed Mobility Overal bed mobility: Needs Assistance Bed Mobility: Supine to Sit     Supine to sit: Mod assist;+2 for physical assistance     General bed mobility comments: Pt required assistance for lower extremity advancement and trunk elevation.  Once in sitting able to scoot to edge of bed.  Once in recliner able to scoot back into recliner.  Transfers Overall transfer level: Needs assistance Equipment used: Rolling walker (2 wheeled) Transfers: Sit to/from Stand Sit to Stand: Mod assist;+2 physical assistance         General transfer comment: Cues for hand placement and assistance to boost into standing.  Pt with flexed posture and poor standing tolerance.  Ambulation/Gait Ambulation/Gait assistance: Min assist;+2 physical assistance Gait Distance (Feet): 4 Feet Assistive device:  Rolling walker (2 wheeled) Gait Pattern/deviations: Step-to pattern;Trunk flexed;Shuffle     General Gait Details: Steps from bed to recliner chair.  Series of forward, turns, sidestepping and backing.   Stairs             Wheelchair Mobility    Modified Rankin (Stroke Patients Only)       Balance Overall balance assessment: Needs assistance Sitting-balance support: No upper extremity supported;Feet supported Sitting balance-Leahy Scale: Fair Sitting balance - Comments: statically with min guard when feet are touching.  Poor without support of feet.     Standing balance-Leahy Scale: Poor Standing balance comment: requires UE and external support +2                            Cognition Arousal/Alertness: Awake/alert Behavior During Therapy: Flat affect Overall Cognitive Status: Within Functional Limits for tasks assessed                                 General Comments: Fatigues quickly but cognition much improved.      Exercises      General Comments        Pertinent Vitals/Pain Pain Assessment: Faces Faces Pain Scale: Hurts little more Pain Location: R LE especially at bruises on knee Pain Descriptors / Indicators: Aching;Sore Pain Intervention(s): Monitored during session;Repositioned    Home Living                      Prior Function  PT Goals (current goals can now be found in the care plan section) Acute Rehab PT Goals Patient Stated Goal: get stronger, go to rehab Potential to Achieve Goals: Fair Progress towards PT goals: Progressing toward goals    Frequency    Min 2X/week      PT Plan Current plan remains appropriate    Co-evaluation PT/OT/SLP Co-Evaluation/Treatment: Yes Reason for Co-Treatment: Complexity of the patient's impairments (multi-system involvement) PT goals addressed during session: Mobility/safety with mobility OT goals addressed during session: ADL's and self-care       AM-PAC PT "6 Clicks" Mobility   Outcome Measure  Help needed turning from your back to your side while in a flat bed without using bedrails?: A Little Help needed moving from lying on your back to sitting on the side of a flat bed without using bedrails?: A Lot Help needed moving to and from a bed to a chair (including a wheelchair)?: A Lot Help needed standing up from a chair using your arms (e.g., wheelchair or bedside chair)?: A Lot Help needed to walk in hospital room?: A Lot Help needed climbing 3-5 steps with a railing? : A Lot 6 Click Score: 13    End of Session Equipment Utilized During Treatment: Gait belt Activity Tolerance: Patient tolerated treatment well;Patient limited by fatigue Patient left: in bed;with call bell/phone within reach;with bed alarm set Nurse Communication: Mobility status PT Visit Diagnosis: Unsteadiness on feet (R26.81);Difficulty in walking, not elsewhere classified (R26.2);Muscle weakness (generalized) (M62.81)     Time: 8590-9311 PT Time Calculation (min) (ACUTE ONLY): 41 min  Charges:  $Gait Training: 8-22 mins                     Erasmo Leventhal , PTA Acute Rehabilitation Services Pager [phone] Office [phone]     Mary Wilcox 01/26/2020, 2:50 PM

## 2020-01-27 DIAGNOSIS — E875 Hyperkalemia: Secondary | ICD-10-CM | POA: Diagnosis not present

## 2020-01-27 LAB — BASIC METABOLIC PANEL
Anion gap: 12 (ref 5–15)
BUN: 26 mg/dL — ABNORMAL HIGH (ref 8–23)
CO2: 24 mmol/L (ref 22–32)
Calcium: 9.3 mg/dL (ref 8.9–10.3)
Chloride: 96 mmol/L — ABNORMAL LOW (ref 98–111)
Creatinine, Ser: 6.84 mg/dL — ABNORMAL HIGH (ref 0.44–1.00)
GFR, Estimated: 6 mL/min — ABNORMAL LOW (ref >60)
Glucose, Bld: 115 mg/dL — ABNORMAL HIGH (ref 70–99)
Potassium: 3.8 mmol/L (ref 3.5–5.1)
Sodium: 132 mmol/L — ABNORMAL LOW (ref 135–145)

## 2020-01-27 LAB — CBC
HCT: 31 % — ABNORMAL LOW (ref 36.0–46.0)
Hemoglobin: 10.1 g/dL — ABNORMAL LOW (ref 12.0–15.0)
MCH: 30.4 pg (ref 26.0–34.0)
MCHC: 32.6 g/dL (ref 30.0–36.0)
MCV: 93.4 fL (ref 80.0–100.0)
Platelets: 241 10*3/uL (ref 150–400)
RBC: 3.32 MIL/uL — ABNORMAL LOW (ref 3.87–5.11)
RDW: 17 % — ABNORMAL HIGH (ref 11.5–15.5)
WBC: 9.7 10*3/uL (ref 4.0–10.5)
nRBC: 0.4 % — ABNORMAL HIGH (ref 0.0–0.2)

## 2020-01-27 MED ORDER — LIDOCAINE-PRILOCAINE 2.5-2.5 % EX CREA: 1.0000 "application " | TOPICAL_CREAM | CUTANEOUS | Status: DC | PRN

## 2020-01-27 MED ORDER — SODIUM CHLORIDE 0.9 % IV SOLN: 100.0000 mL | INTRAVENOUS | Status: DC | PRN

## 2020-01-27 MED ORDER — LIDOCAINE HCL (PF) 1 % IJ SOLN: 5.0000 mL | INTRAMUSCULAR | Status: DC | PRN

## 2020-01-27 MED ORDER — HEPARIN SODIUM (PORCINE) 1000 UNIT/ML DIALYSIS: 1000.0000 [IU] | INTRAMUSCULAR | Status: DC | PRN

## 2020-01-27 MED ORDER — ALTEPLASE 2 MG IJ SOLR: 2.0000 mg | Freq: Once | INTRAMUSCULAR | Status: DC | PRN

## 2020-01-27 MED ORDER — HEPARIN SODIUM (PORCINE) 1000 UNIT/ML IJ SOLN
INTRAMUSCULAR | Status: AC
  Filled 2020-01-27: qty 4

## 2020-01-27 MED ORDER — PENTAFLUOROPROP-TETRAFLUOROETH EX AERO: 1.0000 "application " | INHALATION_SPRAY | CUTANEOUS | Status: DC | PRN

## 2020-01-27 MED ORDER — PEG 3350-KCL-NA BICARB-NACL 420 G PO SOLR
4000.0000 mL | Freq: Once | ORAL | Status: AC
  Administered 2020-01-27: 4000 mL via ORAL
  Filled 2020-01-27: qty 4000

## 2020-01-27 NOTE — Progress Notes (Signed)
PROGRESS NOTE    Mary Wilcox  BDZ:[id number] DOB: 22-Jan-1956 DOA: 01/22/2020 PCP: Kristen Loader, FNP   Brief Narrative: 64 year old with past medical history significant for PVD, ESRD on hemodialysis, hypothyroidism, remote cervical cancer status post external radiation presented after missing hemodialysis.  Patient lives in an upstairs apartment and uses a walker to ambulate and she also uses a wheelchair when leaving the apartment.  She has called EMS to be transported to hemodialysis.  She has been weak and unable to ambulate for this reason, so she stopped going to hemodialysis.  When EMS arrived she was covered in feces and bedbugs. Evaluation in the ED: She is not able to go to her outpatient hemodialysis because she is not able to go up and down the stairs.  She has worsening of her chronic diarrhea.  Hemoglobin has dropped to 8.5 from 11-12.  Patient with a potassium of 7.5.  Received calcium, insulin and glucose in the ED. -11/10: hb down to 6.8, 2 units PRBC ordered  Assessment & Plan:  Hyperkalemia: Volume overload ESRD on hemodialysis -Due to missed HD -Patient received calcium gluconate, insulin and glucose and Lokelma. -Corrected with hemodialysis. -PT OT eval completed, SNF recommended  Acute on chronic anemia Anemia of chronic disease and heme positive stools -Hemoglobin down to 6.6 on 11/10, transfused 2 units of PRBC -Hemoccult positive, patient reports intermittent history of dark stools which she attributed to iron and binders, anemia of chronic disease and hemodilution from fluid overload also contributing -Seen by gastroenterology on admission, recommended infectious work-up, diarrhea has pretty much resolved C. difficile PCR is positive for antigen and negative for toxin, diarrhea has resolved hence suspect this is more consistent with colonization -If diarrhea recurs will start PO Vancomycin x10days -Discussed with gastroenterology, patient's lack of social  support and disease burden limit ability to pursue much outpatient work-up, Dr.Outlaw planning EGD/colonoscopy possibly tomorrow -Hemoglobin/stable improved posttransfusion and volume removal  Hypothyroidism: Continue with Synthroid  Acute on chronic diarrhea: -resolved, C. difficile PCR positive for antigen but negative for toxin which is likely secondary to colonization , if diarrhea recurs will Rx as infection   Obesity; -Needs weight loss/lifestyle modification  Chronic hypotension: Continue with midodrine.  DVT prophylaxis: SCDs Code Status: Full code Family Communication: Discussed with patient Disposition Plan:  Status is: Inpatient  Remains inpatient appropriate because:Ongoing diagnostic testing needed not appropriate for outpatient work up   Dispo: The patient is from: Home              Anticipated d/c is to: SNF              Anticipated d/c date is: 3-4days              Patient currently is not medically stable to d/c.   Consultants:   Nephrology, GI  Procedures:   Hemodialysis  Antimicrobials:    Subjective: -Reports feeling sleepy this morning, no other complaints,  Objective: Vitals:   01/27/20 1100 01/27/20 1130 01/27/20 1200 01/27/20 1230  BP: (!) 152/56 (!) 155/73 (!) 142/75 (!) 151/73  Pulse: 65 72 73 72  Resp: 14 14 13 14   Temp:      TempSrc:      SpO2:      Weight:      Height:        Intake/Output Summary (Last 24 hours) at 01/27/2020 1309 Last data filed at 01/27/2020 0700 Gross per 24 hour  Intake 440 ml  Output 0 ml  Net  440 ml   Filed Weights   01/24/20 2030 01/25/20 1150 01/25/20 1551  Weight: 89.9 kg 87.5 kg 83.2 kg    Examination:  General exam: Obese chronically ill female sitting up in bed, AAOx3, no distress HEENT: Right IJ HD catheter noted CVS: S1-S2, regular rate rhythm Lungs: Decreased breath sounds the bases Abdomen: Soft, nontender, bowel sounds present Extremities: 1+ edema Skin: No rashes on exposed  skin   Data Reviewed: I have personally reviewed following labs and imaging studies  CBC: Recent Labs  Lab 01/23/20 0222 01/24/20 0507 01/25/20 0047 01/26/20 0119 01/27/20 0135  WBC 8.5 9.2 8.2 8.7 9.7  HGB 7.4* 6.6* 9.4* 9.9* 10.1*  HCT 24.1* 20.7* 28.1* 30.0* 31.0*  MCV 99.2 97.2 90.4 91.7 93.4  PLT 221 225 201 173 284   Basic Metabolic Panel: Recent Labs  Lab 01/22/20 1139 01/22/20 1139 01/23/20 0222 01/24/20 0507 01/25/20 0047 01/26/20 0119 01/27/20 0135  NA 136   < > 136 131* 134* 135 132*  K 5.6*   < > 4.9 5.0 3.9 3.7 3.8  CL 97*   < > 98 93* 96* 96* 96*  CO2 19*   < > 23 20* 26 26 24   GLUCOSE 101*   < > 78 78 104* 112* 115*  BUN 85*   < > 40* 49* 22 12 26*  CREATININE 15.27*   < > 10.36* 11.57* 6.18* 4.32* 6.84*  CALCIUM 8.7*   < > 8.9 8.5* 8.6* 9.0 9.3  PHOS 7.3*  --   --  6.6*  --   --   --    < > = values in this interval not displayed.   GFR: Estimated Creatinine Clearance: 7.6 mL/min (A) (by C-G formula based on SCr of 6.84 mg/dL (H)). Liver Function Tests: Recent Labs  Lab 01/22/20 1139 01/24/20 0507  ALBUMIN 2.9* 2.4*   No results for input(s): LIPASE, AMYLASE in the last 168 hours. No results for input(s): AMMONIA in the last 168 hours. Coagulation Profile: No results for input(s): INR, PROTIME in the last 168 hours. Cardiac Enzymes: No results for input(s): CKTOTAL, CKMB, CKMBINDEX, TROPONINI in the last 168 hours. BNP (last 3 results) No results for input(s): PROBNP in the last 8760 hours. HbA1C: No results for input(s): HGBA1C in the last 72 hours. CBG: Recent Labs  Lab 01/23/20 0026  GLUCAP 69*   Lipid Profile: No results for input(s): CHOL, HDL, LDLCALC, TRIG, CHOLHDL, LDLDIRECT in the last 72 hours. Thyroid Function Tests: No results for input(s): TSH, T4TOTAL, FREET4, T3FREE, THYROIDAB in the last 72 hours. Anemia Panel: No results for input(s): VITAMINB12, FOLATE, FERRITIN, TIBC, IRON, RETICCTPCT in the last 72  hours. Sepsis Labs: No results for input(s): PROCALCITON, LATICACIDVEN in the last 168 hours.  Recent Results (from the past 240 hour(s))  Respiratory Panel by RT PCR (Flu A&B, Covid) - Nasopharyngeal Swab     Status: None   Collection Time: 01/22/20  8:34 AM   Specimen: Nasopharyngeal Swab  Result Value Ref Range Status   SARS Coronavirus 2 by RT PCR NEGATIVE NEGATIVE Final    Comment: (NOTE) SARS-CoV-2 target nucleic acids are NOT DETECTED.  The SARS-CoV-2 RNA is generally detectable in upper respiratoy specimens during the acute phase of infection. The lowest concentration of SARS-CoV-2 viral copies this assay can detect is 131 copies/mL. A negative result does not preclude SARS-Cov-2 infection and should not be used as the sole basis for treatment or other patient management decisions. A negative result may  occur with  improper specimen collection/handling, submission of specimen other than nasopharyngeal swab, presence of viral mutation(s) within the areas targeted by this assay, and inadequate number of viral copies (<131 copies/mL). A negative result must be combined with clinical observations, patient history, and epidemiological information. The expected result is Negative.  Fact Sheet for Patients:  PinkCheek.be  Fact Sheet for Healthcare Providers:  GravelBags.it  This test is no t yet approved or cleared by the [place] FDA and  has been authorized for detection and/or diagnosis of SARS-CoV-2 by FDA under an Emergency Use Authorization (EUA). This EUA will remain  in effect (meaning this test can be used) for the duration of the COVID-19 declaration under Section 564(b)(1) of the Act, 21 U.S.C. section 360bbb-3(b)(1), unless the authorization is terminated or revoked sooner.     Influenza A by PCR NEGATIVE NEGATIVE Final   Influenza B by PCR NEGATIVE NEGATIVE Final    Comment: (NOTE) The Xpert Xpress  SARS-CoV-2/FLU/RSV assay is intended as an aid in  the diagnosis of influenza from Nasopharyngeal swab specimens and  should not be used as a sole basis for treatment. Nasal washings and  aspirates are unacceptable for Xpert Xpress SARS-CoV-2/FLU/RSV  testing.  Fact Sheet for Patients: PinkCheek.be  Fact Sheet for Healthcare Providers: GravelBags.it  This test is not yet approved or cleared by the [place] FDA and  has been authorized for detection and/or diagnosis of SARS-CoV-2 by  FDA under an Emergency Use Authorization (EUA). This EUA will remain  in effect (meaning this test can be used) for the duration of the  Covid-19 declaration under Section 564(b)(1) of the Act, 21  U.S.C. section 360bbb-3(b)(1), unless the authorization is  terminated or revoked. Performed at [facility], [place] [address], [place], [place] [address]   Gastrointestinal Panel by PCR , Stool     Status: None   Collection Time: 01/22/20  9:39 AM   Specimen: Stool  Result Value Ref Range Status   Campylobacter species NOT DETECTED NOT DETECTED Final   Plesimonas shigelloides NOT DETECTED NOT DETECTED Final   Salmonella species NOT DETECTED NOT DETECTED Final   Yersinia enterocolitica NOT DETECTED NOT DETECTED Final   Vibrio species NOT DETECTED NOT DETECTED Final   Vibrio cholerae NOT DETECTED NOT DETECTED Final   Enteroaggregative E coli (EAEC) NOT DETECTED NOT DETECTED Final   Enteropathogenic E coli (EPEC) NOT DETECTED NOT DETECTED Final   Enterotoxigenic E coli (ETEC) NOT DETECTED NOT DETECTED Final   Shiga like toxin producing E coli (STEC) NOT DETECTED NOT DETECTED Final   Shigella/Enteroinvasive E coli (EIEC) NOT DETECTED NOT DETECTED Final   Cryptosporidium NOT DETECTED NOT DETECTED Final   Cyclospora cayetanensis NOT DETECTED NOT DETECTED Final   Entamoeba histolytica NOT DETECTED NOT DETECTED Final   Giardia lamblia NOT  DETECTED NOT DETECTED Final   Adenovirus F40/41 NOT DETECTED NOT DETECTED Final   Astrovirus NOT DETECTED NOT DETECTED Final   Norovirus GI/GII NOT DETECTED NOT DETECTED Final   Rotavirus A NOT DETECTED NOT DETECTED Final   Sapovirus (I, II, IV, and V) NOT DETECTED NOT DETECTED Final    Comment: Performed at [facility], [address] [place], [place] [address]  C Difficile Quick Screen w PCR reflex     Status: Abnormal   Collection Time: 01/22/20  9:39 AM   Specimen: STOOL  Result Value Ref Range Status   C Diff antigen POSITIVE (A) NEGATIVE Final   C Diff toxin NEGATIVE  NEGATIVE Final   C Diff interpretation Results are indeterminate. See PCR results.  Final    Comment: Performed at [facility], [place] [address] [place], [place] [address]  C. Diff by PCR, Reflexed     Status: None   Collection Time: 01/22/20  9:39 AM  Result Value Ref Range Status   Toxigenic C. Difficile by PCR NEGATIVE NEGATIVE Final    Comment: Patient is colonized with non toxigenic C. difficile. May not need treatment unless significant symptoms are present. Performed at [facility], [place] [address] [place],  [address]      Scheduled Meds: . sodium chloride   Intravenous Once  . sodium chloride   Intravenous Once  . calcium acetate  667 mg Oral TID with meals  . Chlorhexidine Gluconate Cloth  6 each Topical Q0600  . cinacalcet  90 mg Oral QHS  . darbepoetin (ARANESP) injection - DIALYSIS  100 mcg Intravenous Q Wed-HD  . ferric citrate  420 mg Oral TID with meals  . fluticasone  1 spray Each Nare Daily  . folic acid  1 mg Oral Daily  . gabapentin  100 mg Oral BID  . loratadine  10 mg Oral Daily  . midodrine  10 mg Oral Q T,Th,Sa-HD  . nystatin  1 application Topical BID  . pantoprazole (PROTONIX) IV  40 mg Intravenous Q12H  . polyethylene glycol-electrolytes  4,000 mL Oral Once  . sertraline  150 mg Oral Daily  . sodium chloride flush  3 mL Intravenous Q12H  . sodium  zirconium cyclosilicate  10 g Oral Daily   Continuous Infusions: . sodium chloride    . sodium chloride       LOS: 5 days    Time spent: 25 minutes  Domenic Polite, MD [facility]  01/27/2020, 1:09 PM

## 2020-01-27 NOTE — Progress Notes (Signed)
Patient is back in her room after dialysis. Has not started her colonic prep as yet. Discussed with the patient as well as her nurse that she needs to start and finish colonic prep for EGD and colonoscopy planned in a.m. with Dr. Paulita Fujita for anemia.

## 2020-01-27 NOTE — Progress Notes (Signed)
[facility] Progress Note   Subjective:   Patient seen and examined at bedside in room.  No specific complaints today.  Denies SOB, CP, abdominal pain, n/v/d, weakness, dizziness and fatigue.   Objective Vitals:   01/26/20 2143 01/27/20 0459 01/27/20 0930 01/27/20 1000  BP: (!) 175/63 (!) 160/60 (!) 189/79 (!) 198/94  Pulse: 81 74 73 73  Resp: 18 18 15 15   Temp: 98.7 F (37.1 C) 98.1 F (36.7 C) 98.5 F (36.9 C)   TempSrc: Oral Oral Oral   SpO2: 97% 97%    Weight:      Height:       Physical Exam General:WDWN female in NAD Heart:RRR Lungs:mostly CTAB, nml WOB Abdomen:soft, NTND Extremities:2+ pitting upper and lower extremity edema b/l Dialysis Access: R IJ [facility]   Filed Weights   01/24/20 2030 01/25/20 1150 01/25/20 1551  Weight: 89.9 kg 87.5 kg 83.2 kg    Intake/Output Summary (Last 24 hours) at 01/27/2020 1021 Last data filed at 01/27/2020 0700 Gross per 24 hour  Intake 640 ml  Output 0 ml  Net 640 ml    Additional Objective Labs: Basic Metabolic Panel: Recent Labs  Lab 01/22/20 1139 01/23/20 0222 01/24/20 0507 01/24/20 0507 01/25/20 0047 01/26/20 0119 01/27/20 0135  NA 136   < > 131*   < > 134* 135 132*  K 5.6*   < > 5.0   < > 3.9 3.7 3.8  CL 97*   < > 93*   < > 96* 96* 96*  CO2 19*   < > 20*   < > 26 26 24   GLUCOSE 101*   < > 78   < > 104* 112* 115*  BUN 85*   < > 49*   < > 22 12 26*  CREATININE 15.27*   < > 11.57*   < > 6.18* 4.32* 6.84*  CALCIUM 8.7*   < > 8.5*   < > 8.6* 9.0 9.3  PHOS 7.3*  --  6.6*  --   --   --   --    < > = values in this interval not displayed.   Liver Function Tests: Recent Labs  Lab 01/22/20 1139 01/24/20 0507  ALBUMIN 2.9* 2.4*   CBC: Recent Labs  Lab 01/23/20 0222 01/23/20 0222 01/24/20 0507 01/24/20 0507 01/25/20 0047 01/26/20 0119 01/27/20 0135  WBC 8.5   < > 9.2   < > 8.2 8.7 9.7  HGB 7.4*   < > 6.6*   < > 9.4* 9.9* 10.1*  HCT 24.1*   < > 20.7*   < > 28.1* 30.0* 31.0*  MCV 99.2  --   97.2  --  90.4 91.7 93.4  PLT 221   < > 225   < > 201 173 241   < > = values in this interval not displayed.   Blood Culture    Component Value Date/Time   SDES URINE, CATHETERIZED 04/12/2011 0335   SPECREQUEST NONE 04/12/2011 0335   CULT ESCHERICHIA COLI 04/12/2011 0335   REPTSTATUS 04/14/2011 FINAL 04/12/2011 0335     Medications: . sodium chloride    . sodium chloride     . sodium chloride   Intravenous Once  . sodium chloride   Intravenous Once  . calcium acetate  667 mg Oral TID with meals  . Chlorhexidine Gluconate Cloth  6 each Topical Q0600  . cinacalcet  90 mg Oral QHS  . darbepoetin (ARANESP) injection - DIALYSIS  100 mcg Intravenous  Q Wed-HD  . ferric citrate  420 mg Oral TID with meals  . fluticasone  1 spray Each Nare Daily  . folic acid  1 mg Oral Daily  . gabapentin  100 mg Oral BID  . loratadine  10 mg Oral Daily  . midodrine  10 mg Oral Q T,Th,Sa-HD  . nystatin  1 application Topical BID  . pantoprazole (PROTONIX) IV  40 mg Intravenous Q12H  . polyethylene glycol-electrolytes  4,000 mL Oral Once  . sertraline  150 mg Oral Daily  . sodium chloride flush  3 mL Intravenous Q12H  . sodium zirconium cyclosilicate  10 g Oral Daily    Dialysis Orders: TTS [facility], 3:45h, 160 NR, 400/800, 1k/2.5Ca, EDW 82kg CVC Mircera 100 q2wks , venofer 50 weekly Last HD 10/30 - then 10/23, 10/16. Post wt 81.6kg.  Assessment/Plan: 1. Hyperkalemia - Generally running on 1K as outpt.  Low K diet. Lokelma 10 daily here for now. Resolved with HD. K 3.8 today. 2. Volume overload - significant volume overload on admit.  Closer to dry weight post dialysis with net UF 4L removed.  Continues to have significant edema.  Plan for UF 4L again today.  Will need lower dry weight on discharge. 3. ?GIB - +hemoccult. GI agreed to inpatient work up due to pt poor social situation.  Plan for EGD/colonoscopy Sunday.  4. ESRD - on HD TTS.  Has been off schedule d/t emergent needs/high IP  dialysis census but returned to normal schedule 11/11.  Next HD today. Missing HD d/t not being able to make it from 2nd story apartment per pt, CM/SW working on other arrangements, ?SNF. 5. Anemia of CKD - Hgb 10.1 s/p 1 unit pRBC on 11/10. Dropped to 6.6 from 8.7 after admission likely d/t #2.  tsat 36%. Cont Aranesp 100 qwk.  6. Secondary hyperparathyroidism - outpt PTH 339 on 10/23. Ca in goal.  Last phos elevated.  Continue binders and sensipar.  7. HTN - BP elevated.  On midodrine for BP support as OP with HD.  Remains volume overloaded, expect improvement after UF.   8. Nutrition - Renal diet w/fluid restrictions.  9. Dispo - PT recommending SNF-   Jen Mow, PA-C [facility] 01/27/2020,10:21 AM  LOS: 5 days

## 2020-01-28 DIAGNOSIS — E875 Hyperkalemia: Secondary | ICD-10-CM | POA: Diagnosis not present

## 2020-01-28 MED ORDER — DARBEPOETIN ALFA 100 MCG/0.5ML IJ SOSY: 100.0000 ug | PREFILLED_SYRINGE | INTRAMUSCULAR | Status: DC

## 2020-01-28 MED ORDER — PEG 3350-KCL-NA BICARB-NACL 420 G PO SOLR
4000.0000 mL | Freq: Once | ORAL | Status: DC
  Filled 2020-01-28: qty 4000

## 2020-01-28 MED ORDER — HEPARIN SODIUM (PORCINE) 5000 UNIT/ML IJ SOLN
5000.0000 [IU] | Freq: Three times a day (TID) | INTRAMUSCULAR | Status: DC
  Administered 2020-01-28 – 2020-01-30 (×6): 5000 [IU] via SUBCUTANEOUS
  Filled 2020-01-28 (×7): qty 1

## 2020-01-28 NOTE — Progress Notes (Signed)
Patient only consumed half prep and bowel output not clear.  Will keep on clear liquids, pursue more prep, and plan for egd/colonoscopy tomorrow with Dr. Michail Sermon.

## 2020-01-28 NOTE — Plan of Care (Signed)
  Problem: Education: Goal: Knowledge of General Education information will improve Description Including pain rating scale, medication(s)/side effects and non-pharmacologic comfort measures Outcome: Progressing   Problem: Health Behavior/Discharge Planning: Goal: Ability to manage health-related needs will improve Outcome: Progressing   

## 2020-01-28 NOTE — Progress Notes (Signed)
[facility] Progress Note   Subjective:   Patient seen and examined at bedside.  Reports she only finished half of her bowel prep because "it was hard to drink that much."  No other specific complaints.  Denies SOB, n/v/d, abdominal pain, weakness and fatigue.    Objective Vitals:   01/27/20 1733 01/27/20 2141 01/28/20 0522 01/28/20 1105  BP: (!) 173/87 (!) 160/59 (!) 173/63 136/81  Pulse: 78 73 71 73  Resp: 20 18 16 16   Temp: 97.8 F (36.6 C) 99.2 F (37.3 C) 98.2 F (36.8 C) 98.8 F (37.1 C)  TempSrc:  Oral Oral Oral  SpO2: 98% 98% 98% 97%  Weight:  83.7 kg    Height:       Physical Exam General:WDWN female in NAD Heart:RRR Lungs:mostly CTAB Abdomen:soft, NTND Extremities:1+ upper and LE edema Dialysis Access: R IJ [facility]   Filed Weights   01/25/20 1551 01/27/20 1345 01/27/20 2141  Weight: 83.2 kg 80.3 kg 83.7 kg    Intake/Output Summary (Last 24 hours) at 01/28/2020 1224 Last data filed at 01/28/2020 0947 Gross per 24 hour  Intake 1440 ml  Output 3478 ml  Net -2038 ml    Additional Objective Labs: Basic Metabolic Panel: Recent Labs  Lab 01/22/20 1139 01/23/20 0222 01/24/20 0507 01/24/20 0507 01/25/20 0047 01/26/20 0119 01/27/20 0135  NA 136   < > 131*   < > 134* 135 132*  K 5.6*   < > 5.0   < > 3.9 3.7 3.8  CL 97*   < > 93*   < > 96* 96* 96*  CO2 19*   < > 20*   < > 26 26 24   GLUCOSE 101*   < > 78   < > 104* 112* 115*  BUN 85*   < > 49*   < > 22 12 26*  CREATININE 15.27*   < > 11.57*   < > 6.18* 4.32* 6.84*  CALCIUM 8.7*   < > 8.5*   < > 8.6* 9.0 9.3  PHOS 7.3*  --  6.6*  --   --   --   --    < > = values in this interval not displayed.   Liver Function Tests: Recent Labs  Lab 01/22/20 1139 01/24/20 0507  ALBUMIN 2.9* 2.4*   CBC: Recent Labs  Lab 01/23/20 0222 01/23/20 0222 01/24/20 0507 01/24/20 0507 01/25/20 0047 01/26/20 0119 01/27/20 0135  WBC 8.5   < > 9.2   < > 8.2 8.7 9.7  HGB 7.4*   < > 6.6*   < > 9.4* 9.9*  10.1*  HCT 24.1*   < > 20.7*   < > 28.1* 30.0* 31.0*  MCV 99.2  --  97.2  --  90.4 91.7 93.4  PLT 221   < > 225   < > 201 173 241   < > = values in this interval not displayed.   CBG: Recent Labs  Lab 01/23/20 0026  GLUCAP 69*    Medications:  . sodium chloride   Intravenous Once  . sodium chloride   Intravenous Once  . calcium acetate  667 mg Oral TID with meals  . Chlorhexidine Gluconate Cloth  6 each Topical Q0600  . cinacalcet  90 mg Oral QHS  . darbepoetin (ARANESP) injection - DIALYSIS  100 mcg Intravenous Q Wed-HD  . ferric citrate  420 mg Oral TID with meals  . fluticasone  1 spray Each Nare Daily  . folic  acid  1 mg Oral Daily  . gabapentin  100 mg Oral BID  . loratadine  10 mg Oral Daily  . midodrine  10 mg Oral Q T,Th,Sa-HD  . nystatin  1 application Topical BID  . pantoprazole (PROTONIX) IV  40 mg Intravenous Q12H  . polyethylene glycol-electrolytes  4,000 mL Oral Once  . sertraline  150 mg Oral Daily  . sodium chloride flush  3 mL Intravenous Q12H  . sodium zirconium cyclosilicate  10 g Oral Daily    Dialysis Orders: TTS [facility], 3:45h, 160 NR, 400/800, 1k/2.5Ca, EDW 82kg CVC Mircera 100 q2wks , venofer 50 weekly Last HD 10/30 - then 10/23, 10/16. Post wt 81.6kg.  Assessment/Plan: 1.Hyperkalemia- Generally running on 1Kasoutpt.  Low K diet. Lokelma 10 daily here for now. Resolved with HD. last K 3.8. 2. Volume overload- significant volume overload on admit. Under dry weight post dialysis ~ 80.3kg Continues to have  edema. Tolerating UF 3.5-4L.  Continue to titrate down volume, will need lower dry weight on discharge. 3. ?GIB- +hemoccult. EGD/colonscopy pushed to tomorrow due to inadequate bowel prep.    4. ESRD- on HD TTS.Missing HD d/t not being able to make it from 2nd story apartment per pt, CM/SW working on other arrangements, ?SNF.  Next HD 11/16. 5. Anemiaof CKD -Hgb 10.1 s/p 1 unit pRBC on 11/10. Dropped to 6.6 from 8.7 after  admission likely d/t #2. tsat 36%. Cont Aranesp 100 qwk.  6. Secondary hyperparathyroidism- outpt PTH 339 on 10/23. Ca in goal. Last phos elevated. Continue binders and sensipar.  7.HTN- BP well controlled today.On midodrine for BP support as OP with HD.  8. Nutrition- Renal diet w/fluid restrictions.  9. Dispo - PT recommending SNF-  Jen Mow, PA-C [facility] 01/28/2020,12:24 PM  LOS: 6 days

## 2020-01-28 NOTE — Anesthesia Preprocedure Evaluation (Addendum)
Anesthesia Evaluation  Patient identified by MRN, date of birth, ID band Patient awake    Reviewed: Allergy & Precautions, NPO status , [name] Chart, lab work & pertinent test results  Airway Mallampati: III  TM Distance: >3 FB Neck ROM: Full    Dental  (+) Dental Advisory Given, Poor Dentition, Missing,    Pulmonary neg pulmonary ROS,    Pulmonary exam normal breath sounds clear to auscultation       Cardiovascular hypertension, + Peripheral Vascular Disease  Normal cardiovascular exam Rhythm:Regular Rate:Normal  Was not on any home antihypertensives, has been receiving PRN hydralazine and labetalol during this admission for SBPs in the 180s-190s  SBP 212 in preop   Neuro/Psych PSYCHIATRIC DISORDERS Depression negative neurological ROS     GI/Hepatic Neg liver ROS, GIB + hemoccult   Endo/Other  Hypothyroidism Obesity BMI 37  Renal/GU ESRF and DialysisRenal diseaseHD T/Th/Sat K 4.0 this AM   Hx cervical ca    Musculoskeletal negative musculoskeletal ROS (+)   Abdominal (+) + obese,   Peds  Hematology  (+) Blood dyscrasia, anemia , ABLA H/H 10.8/33.2, plt 204   Anesthesia Other Findings   Reproductive/Obstetrics negative OB ROS                           Anesthesia Physical Anesthesia Plan  ASA: III  Anesthesia Plan: MAC   Post-op Pain Management:    Induction:   PONV Risk Score and Plan: 2 and Propofol infusion, TIVA and Treatment may vary due to age or medical condition  Airway Management Planned: Natural Airway and Nasal Cannula  Additional Equipment: None  Intra-op Plan:   Post-operative Plan:   Informed Consent: I have reviewed the patients History and Physical, chart, labs and discussed the procedure including the risks, benefits and alternatives for the proposed anesthesia with the patient or authorized representative who has indicated his/her understanding and  acceptance.     Dental advisory given  Plan Discussed with: CRNA  Anesthesia Plan Comments: (Extremely hypertensive in preop- will treat with IV labetalol prior to anesthesia)      Anesthesia Quick Evaluation

## 2020-01-28 NOTE — Progress Notes (Signed)
PROGRESS NOTE    Mary Wilcox  JOI:[id number] DOB: Dec 31, 1955 DOA: 01/22/2020 PCP: Kristen Loader, FNP   Brief Narrative: 64 year old with past medical history significant for PVD, ESRD on hemodialysis, hypothyroidism, remote cervical cancer status post external radiation presented after missing hemodialysis.  Patient lives in an upstairs apartment and uses a walker to ambulate and she also uses a wheelchair when leaving the apartment.  She has called EMS to be transported to hemodialysis.  She has been weak and unable to ambulate for this reason, so she stopped going to hemodialysis.  When EMS arrived she was covered in feces and bedbugs. Evaluation in the ED: She is not able to go to her outpatient hemodialysis because she is not able to go up and down the stairs.  She has worsening of her chronic diarrhea.  Hemoglobin has dropped to 8.5 from 11-12.  Patient with a potassium of 7.5.  Received calcium, insulin and glucose in the ED. -11/10: hb down to 6.8, 2 units PRBC ordered  Assessment & Plan:  Hyperkalemia: Volume overload ESRD on hemodialysis -Due to missed HD -Patient received calcium gluconate, insulin and glucose and Lokelma. -Corrected with hemodialysis. -PT OT eval completed, SNF recommended  Acute on chronic anemia Anemia of chronic disease and heme positive stools -Hemoglobin down to 6.6 on 11/10, transfused 2 units of PRBC -Hemoccult positive, patient reports intermittent history of dark stools which she attributed to iron and binders, anemia of chronic disease and hemodilution from fluid overload -Seen by gastroenterology on admission, recommended infectious work-up, C. difficile PCR is positive for antigen and negative for toxin, diarrhea has resolved hence suspect this is more consistent with colonization -If diarrhea recurs will start PO Vancomycin x10days -Discussed with gastroenterology, patient's lack of social support and disease burden limit ability to pursue  much outpatient work-up, plan was for EGD/colonoscopy today, now postponed to tomorrow due to inadequate prep -Hemoglobin/stable improved posttransfusion and volume removal  Hypothyroidism: Continue with Synthroid  Acute on chronic diarrhea: -resolved, C. difficile PCR positive for antigen but negative for toxin which is likely secondary to colonization , if diarrhea recurs will Rx as infection   Obesity; -Needs weight loss/lifestyle modification  Chronic hypotension: Continue with midodrine.  DVT prophylaxis: SCDs Code Status: Full code Family Communication: Discussed with patient Disposition Plan:  Status is: Inpatient  Remains inpatient appropriate because:Ongoing diagnostic testing needed not appropriate for outpatient work up   Dispo: The patient is from: Home              Anticipated d/c is to: SNF              Anticipated d/c date is: 2-3days              Patient currently is not medically stable to d/c.   Consultants:   Nephrology, GI  Procedures:   Hemodialysis  Antimicrobials:    Subjective: -Only consumed half her prep, bowels still not clear, colonoscopy postponed  Objective: Vitals:   01/27/20 1733 01/27/20 2141 01/28/20 0522 01/28/20 1105  BP: (!) 173/87 (!) 160/59 (!) 173/63 136/81  Pulse: 78 73 71 73  Resp: 20 18 16 16   Temp: 97.8 F (36.6 C) 99.2 F (37.3 C) 98.2 F (36.8 C) 98.8 F (37.1 C)  TempSrc:  Oral Oral Oral  SpO2: 98% 98% 98% 97%  Weight:  83.7 kg    Height:        Intake/Output Summary (Last 24 hours) at 01/28/2020 1359 Last data filed at  01/28/2020 0947 Gross per 24 hour  Intake 1440 ml  Output 0 ml  Net 1440 ml   Filed Weights   01/25/20 1551 01/27/20 1345 01/27/20 2141  Weight: 83.2 kg 80.3 kg 83.7 kg    Examination:  General exam: Obese chronically ill female sitting up in bed, AAOx3, no distress HEENT: Right IJ HD catheter noted CVS: S1-S2, regular rate rhythm Lungs: Decreased breath sounds the  bases Abdomen: Soft, nontender, bowel sounds present Extremities: Trace edema, left arm with 1+ edema  Skin: No rashes on exposed skin   Data Reviewed: I have personally reviewed following labs and imaging studies  CBC: Recent Labs  Lab 01/23/20 0222 01/24/20 0507 01/25/20 0047 01/26/20 0119 01/27/20 0135  WBC 8.5 9.2 8.2 8.7 9.7  HGB 7.4* 6.6* 9.4* 9.9* 10.1*  HCT 24.1* 20.7* 28.1* 30.0* 31.0*  MCV 99.2 97.2 90.4 91.7 93.4  PLT 221 225 201 173 664   Basic Metabolic Panel: Recent Labs  Lab 01/22/20 1139 01/22/20 1139 01/23/20 0222 01/24/20 0507 01/25/20 0047 01/26/20 0119 01/27/20 0135  NA 136   < > 136 131* 134* 135 132*  K 5.6*   < > 4.9 5.0 3.9 3.7 3.8  CL 97*   < > 98 93* 96* 96* 96*  CO2 19*   < > 23 20* 26 26 24   GLUCOSE 101*   < > 78 78 104* 112* 115*  BUN 85*   < > 40* 49* 22 12 26*  CREATININE 15.27*   < > 10.36* 11.57* 6.18* 4.32* 6.84*  CALCIUM 8.7*   < > 8.9 8.5* 8.6* 9.0 9.3  PHOS 7.3*  --   --  6.6*  --   --   --    < > = values in this interval not displayed.   GFR: Estimated Creatinine Clearance: 7.6 mL/min (A) (by C-G formula based on SCr of 6.84 mg/dL (H)). Liver Function Tests: Recent Labs  Lab 01/22/20 1139 01/24/20 0507  ALBUMIN 2.9* 2.4*   No results for input(s): LIPASE, AMYLASE in the last 168 hours. No results for input(s): AMMONIA in the last 168 hours. Coagulation Profile: No results for input(s): INR, PROTIME in the last 168 hours. Cardiac Enzymes: No results for input(s): CKTOTAL, CKMB, CKMBINDEX, TROPONINI in the last 168 hours. BNP (last 3 results) No results for input(s): PROBNP in the last 8760 hours. HbA1C: No results for input(s): HGBA1C in the last 72 hours. CBG: Recent Labs  Lab 01/23/20 0026  GLUCAP 69*   Lipid Profile: No results for input(s): CHOL, HDL, LDLCALC, TRIG, CHOLHDL, LDLDIRECT in the last 72 hours. Thyroid Function Tests: No results for input(s): TSH, T4TOTAL, FREET4, T3FREE, THYROIDAB in the last  72 hours. Anemia Panel: No results for input(s): VITAMINB12, FOLATE, FERRITIN, TIBC, IRON, RETICCTPCT in the last 72 hours. Sepsis Labs: No results for input(s): PROCALCITON, LATICACIDVEN in the last 168 hours.  Recent Results (from the past 240 hour(s))  Respiratory Panel by RT PCR (Flu A&B, Covid) - Nasopharyngeal Swab     Status: None   Collection Time: 01/22/20  8:34 AM   Specimen: Nasopharyngeal Swab  Result Value Ref Range Status   SARS Coronavirus 2 by RT PCR NEGATIVE NEGATIVE Final    Comment: (NOTE) SARS-CoV-2 target nucleic acids are NOT DETECTED.  The SARS-CoV-2 RNA is generally detectable in upper respiratoy specimens during the acute phase of infection. The lowest concentration of SARS-CoV-2 viral copies this assay can detect is 131 copies/mL. A negative result does not preclude  SARS-Cov-2 infection and should not be used as the sole basis for treatment or other patient management decisions. A negative result may occur with  improper specimen collection/handling, submission of specimen other than nasopharyngeal swab, presence of viral mutation(s) within the areas targeted by this assay, and inadequate number of viral copies (<131 copies/mL). A negative result must be combined with clinical observations, patient history, and epidemiological information. The expected result is Negative.  Fact Sheet for Patients:  PinkCheek.be  Fact Sheet for Healthcare Providers:  GravelBags.it  This test is no t yet approved or cleared by the [place] FDA and  has been authorized for detection and/or diagnosis of SARS-CoV-2 by FDA under an Emergency Use Authorization (EUA). This EUA will remain  in effect (meaning this test can be used) for the duration of the COVID-19 declaration under Section 564(b)(1) of the Act, 21 U.S.C. section 360bbb-3(b)(1), unless the authorization is terminated or revoked sooner.      Influenza A by PCR NEGATIVE NEGATIVE Final   Influenza B by PCR NEGATIVE NEGATIVE Final    Comment: (NOTE) The Xpert Xpress SARS-CoV-2/FLU/RSV assay is intended as an aid in  the diagnosis of influenza from Nasopharyngeal swab specimens and  should not be used as a sole basis for treatment. Nasal washings and  aspirates are unacceptable for Xpert Xpress SARS-CoV-2/FLU/RSV  testing.  Fact Sheet for Patients: PinkCheek.be  Fact Sheet for Healthcare Providers: GravelBags.it  This test is not yet approved or cleared by the [place] FDA and  has been authorized for detection and/or diagnosis of SARS-CoV-2 by  FDA under an Emergency Use Authorization (EUA). This EUA will remain  in effect (meaning this test can be used) for the duration of the  Covid-19 declaration under Section 564(b)(1) of the Act, 21  U.S.C. section 360bbb-3(b)(1), unless the authorization is  terminated or revoked. Performed at [facility], [place] [address], [place], [place] [address]   Gastrointestinal Panel by PCR , Stool     Status: None   Collection Time: 01/22/20  9:39 AM   Specimen: Stool  Result Value Ref Range Status   Campylobacter species NOT DETECTED NOT DETECTED Final   Plesimonas shigelloides NOT DETECTED NOT DETECTED Final   Salmonella species NOT DETECTED NOT DETECTED Final   Yersinia enterocolitica NOT DETECTED NOT DETECTED Final   Vibrio species NOT DETECTED NOT DETECTED Final   Vibrio cholerae NOT DETECTED NOT DETECTED Final   Enteroaggregative E coli (EAEC) NOT DETECTED NOT DETECTED Final   Enteropathogenic E coli (EPEC) NOT DETECTED NOT DETECTED Final   Enterotoxigenic E coli (ETEC) NOT DETECTED NOT DETECTED Final   Shiga like toxin producing E coli (STEC) NOT DETECTED NOT DETECTED Final   Shigella/Enteroinvasive E coli (EIEC) NOT DETECTED NOT DETECTED Final   Cryptosporidium NOT DETECTED NOT DETECTED Final   Cyclospora  cayetanensis NOT DETECTED NOT DETECTED Final   Entamoeba histolytica NOT DETECTED NOT DETECTED Final   Giardia lamblia NOT DETECTED NOT DETECTED Final   Adenovirus F40/41 NOT DETECTED NOT DETECTED Final   Astrovirus NOT DETECTED NOT DETECTED Final   Norovirus GI/GII NOT DETECTED NOT DETECTED Final   Rotavirus A NOT DETECTED NOT DETECTED Final   Sapovirus (I, II, IV, and V) NOT DETECTED NOT DETECTED Final    Comment: Performed at [facility], [address] [place], [place] [address]  C Difficile Quick Screen w PCR reflex     Status: Abnormal   Collection Time: 01/22/20  9:39 AM   Specimen:  STOOL  Result Value Ref Range Status   C Diff antigen POSITIVE (A) NEGATIVE Final   C Diff toxin NEGATIVE NEGATIVE Final   C Diff interpretation Results are indeterminate. See PCR results.  Final    Comment: Performed at [facility], [place] [address], [place], [place] [address]  C. Diff by PCR, Reflexed     Status: None   Collection Time: 01/22/20  9:39 AM  Result Value Ref Range Status   Toxigenic C. Difficile by PCR NEGATIVE NEGATIVE Final    Comment: Patient is colonized with non toxigenic C. difficile. May not need treatment unless significant symptoms are present. Performed at [facility], [place] [address], [place],  [address]      Scheduled Meds: . sodium chloride   Intravenous Once  . sodium chloride   Intravenous Once  . calcium acetate  667 mg Oral TID with meals  . Chlorhexidine Gluconate Cloth  6 each Topical Q0600  . cinacalcet  90 mg Oral QHS  . [START ON 02/01/2020] darbepoetin (ARANESP) injection - DIALYSIS  100 mcg Intravenous Q Thu-HD  . ferric citrate  420 mg Oral TID with meals  . fluticasone  1 spray Each Nare Daily  . folic acid  1 mg Oral Daily  . gabapentin  100 mg Oral BID  . loratadine  10 mg Oral Daily  . midodrine  10 mg Oral Q T,Th,Sa-HD  . nystatin  1 application Topical BID  . pantoprazole (PROTONIX) IV  40 mg Intravenous Q12H  .  polyethylene glycol-electrolytes  4,000 mL Oral Once  . sertraline  150 mg Oral Daily  . sodium chloride flush  3 mL Intravenous Q12H  . sodium zirconium cyclosilicate  10 g Oral Daily   Continuous Infusions:    LOS: 6 days    Time spent: 25 minutes  Domenic Polite, MD [facility]  01/28/2020, 1:59 PM

## 2020-01-28 NOTE — H&P (View-Only) (Signed)
Patient only consumed half prep and bowel output not clear.  Will keep on clear liquids, pursue more prep, and plan for egd/colonoscopy tomorrow with Dr. Michail Sermon.

## 2020-01-29 ENCOUNTER — Encounter (HOSPITAL_COMMUNITY)
Admission: EM | Disposition: A | Discharge status: Skilled Nursing Facility | Payer: Self-pay | Source: Home / Self Care | Attending: Internal Medicine

## 2020-01-29 ENCOUNTER — Inpatient Hospital Stay (HOSPITAL_COMMUNITY): Payer: Medicare Other | Admitting: Certified Registered Nurse Anesthetist

## 2020-01-29 ENCOUNTER — Encounter (HOSPITAL_COMMUNITY): Payer: Self-pay | Admitting: Internal Medicine

## 2020-01-29 DIAGNOSIS — E875 Hyperkalemia: Secondary | ICD-10-CM | POA: Diagnosis not present

## 2020-01-29 HISTORY — PX: ESOPHAGOGASTRODUODENOSCOPY: SHX5428

## 2020-01-29 HISTORY — PX: COLONOSCOPY: SHX5424

## 2020-01-29 HISTORY — PX: BIOPSY: SHX5522

## 2020-01-29 LAB — CBC
HCT: 33.2 % — ABNORMAL LOW (ref 36.0–46.0)
Hemoglobin: 10.8 g/dL — ABNORMAL LOW (ref 12.0–15.0)
MCH: 30.4 pg (ref 26.0–34.0)
MCHC: 32.5 g/dL (ref 30.0–36.0)
MCV: 93.5 fL (ref 80.0–100.0)
Platelets: 204 10*3/uL (ref 150–400)
RBC: 3.55 MIL/uL — ABNORMAL LOW (ref 3.87–5.11)
RDW: 16.1 % — ABNORMAL HIGH (ref 11.5–15.5)
WBC: 8 10*3/uL (ref 4.0–10.5)
nRBC: 0 % (ref 0.0–0.2)

## 2020-01-29 LAB — BASIC METABOLIC PANEL
Anion gap: 12 (ref 5–15)
BUN: 16 mg/dL (ref 8–23)
CO2: 25 mmol/L (ref 22–32)
Calcium: 10.6 mg/dL — ABNORMAL HIGH (ref 8.9–10.3)
Chloride: 95 mmol/L — ABNORMAL LOW (ref 98–111)
Creatinine, Ser: 5.83 mg/dL — ABNORMAL HIGH (ref 0.44–1.00)
GFR, Estimated: 7 mL/min — ABNORMAL LOW (ref >60)
Glucose, Bld: 74 mg/dL (ref 70–99)
Potassium: 4 mmol/L (ref 3.5–5.1)
Sodium: 132 mmol/L — ABNORMAL LOW (ref 135–145)

## 2020-01-29 LAB — GLUCOSE, CAPILLARY: Glucose-Capillary: 102 mg/dL — ABNORMAL HIGH (ref 70–99)

## 2020-01-29 SURGERY — EGD (ESOPHAGOGASTRODUODENOSCOPY)
Anesthesia: Monitor Anesthesia Care

## 2020-01-29 MED ORDER — SODIUM CHLORIDE 0.9 % IV SOLN
INTRAVENOUS | Status: AC | PRN
  Administered 2020-01-29: 500 mL via INTRAVENOUS

## 2020-01-29 MED ORDER — LABETALOL HCL 5 MG/ML IV SOLN
INTRAVENOUS | Status: AC
  Filled 2020-01-29: qty 4

## 2020-01-29 MED ORDER — PROPOFOL 500 MG/50ML IV EMUL
INTRAVENOUS | Status: DC | PRN
  Administered 2020-01-29: 75 ug/kg/min via INTRAVENOUS

## 2020-01-29 MED ORDER — PROPOFOL 10 MG/ML IV BOLUS
INTRAVENOUS | Status: DC | PRN
  Administered 2020-01-29: 50 mg via INTRAVENOUS

## 2020-01-29 MED ORDER — LIDOCAINE 2% (20 MG/ML) 5 ML SYRINGE
INTRAMUSCULAR | Status: DC | PRN
  Administered 2020-01-29: 40 mg via INTRAVENOUS

## 2020-01-29 NOTE — Op Note (Signed)
[facility] Patient Name: Mary Wilcox Procedure Date : 01/29/2020 MRN: [id number] Attending MD: Lear Ng , MD Date of Birth: 03-26-1955 CSN: [id number] Age: 64 Admit Type: Inpatient Procedure:                Colonoscopy Indications:              This is the patient's first colonoscopy, Iron                            deficiency anemia Providers:                Wilford Corner, MD, Erenest Rasher, RN, Cletis Athens, Technician, Lesia Sago, Technician,                            Cira Servant, CRNA Referring MD:             hospital team Medicines:                Propofol per Anesthesia, Monitored Anesthesia Care Complications:            No immediate complications. Estimated Blood Loss:     Estimated blood loss was minimal. Procedure:                Pre-Anesthesia Assessment:                           - Prior to the procedure, a History and Physical                            was performed, and patient medications and                            allergies were reviewed. The patient's tolerance of                            previous anesthesia was also reviewed. The risks                            and benefits of the procedure and the sedation                            options and risks were discussed with the patient.                            All questions were answered, and informed consent                            was obtained. Prior Anticoagulants: The patient has                            taken no previous anticoagulant or antiplatelet  agents. ASA Grade Assessment: III - A patient with                            severe systemic disease. After reviewing the risks                            and benefits, the patient was deemed in                            satisfactory condition to undergo the procedure.                           - Prior to the procedure, a History and Physical                             was performed, and patient medications and                            allergies were reviewed. The patient's tolerance of                            previous anesthesia was also reviewed. The risks                            and benefits of the procedure and the sedation                            options and risks were discussed with the patient.                            All questions were answered, and informed consent                            was obtained. Prior Anticoagulants: The patient has                            taken no previous anticoagulant or antiplatelet                            agents. ASA Grade Assessment: III - A patient with                            severe systemic disease. After reviewing the risks                            and benefits, the patient was deemed in                            satisfactory condition to undergo the procedure.                           After obtaining informed consent, the colonoscope  was passed under direct vision. Throughout the                            procedure, the patient's blood pressure, pulse, and                            oxygen saturations were monitored continuously. The                            [id number]-[id number] ([id number] Olympus pediatric colonscope                            was introduced through the anus and advanced to the                            the cecum, identified by appendiceal orifice and                            ileocecal valve. The colonoscopy was performed                            without difficulty. The patient tolerated the                            procedure fairly well. The quality of the bowel                            preparation was fair and fair but repeated                            irrigation led to a good and adequate prep. The                            ileocecal valve, appendiceal orifice, and rectum                            were  photographed. Scope In: 1:07:30 PM Scope Out: 1:18:28 PM Scope Withdrawal Time: 0 hours 5 minutes 4 seconds  Total Procedure Duration: 0 hours 10 minutes 58 seconds  Findings:      Internal hemorrhoids were found during retroflexion. The hemorrhoids       were medium-sized and Grade I (internal hemorrhoids that do not       prolapse).      The exam was otherwise normal throughout the examined colon.      The perianal exam findings include non-thrombosed external hemorrhoids. Impression:               - Preparation of the colon was fair.                           - Internal hemorrhoids.                           - Non-thrombosed external hemorrhoids found on  perianal exam.                           - No specimens collected. Recommendation:           - ProctoFoam-HC: Apply externally daily PRN.                           - Continue present medications.                           - Advance diet as tolerated and clear liquid diet.                           - Repeat colonoscopy in 10 years for screening                            purposes. Procedure Code(s):        --- Professional ---                           [phone], Colonoscopy, flexible; diagnostic, including                            collection of specimen(s) by brushing or washing,                            when performed (separate procedure) Diagnosis Code(s):        --- Professional ---                           D50.9, Iron deficiency anemia, unspecified                           K64.0, First degree hemorrhoids                           K64.4, Residual hemorrhoidal skin tags CPT copyright 2019 [employer]. All rights reserved. The codes documented in this report are preliminary and upon coder review may  be revised to meet current compliance requirements. Lear Ng, MD 01/29/2020 1:32:37 PM This report has been signed electronically. Number of Addenda: 0

## 2020-01-29 NOTE — Anesthesia Postprocedure Evaluation (Signed)
Anesthesia Post Note  Patient: Mary Wilcox  Procedure(s) Performed: ESOPHAGOGASTRODUODENOSCOPY (EGD) (N/A ) COLONOSCOPY (N/A ) BIOPSY     Patient location during evaluation: PACU Anesthesia Type: MAC Level of consciousness: awake and alert Pain management: pain level controlled Vital Signs Assessment: post-procedure vital signs reviewed and stable Respiratory status: spontaneous breathing, nonlabored ventilation and respiratory function stable Cardiovascular status: blood pressure returned to baseline and stable Postop Assessment: no apparent nausea or vomiting Anesthetic complications: no   No complications documented.  Last Vitals:  Vitals:   01/29/20 1341 01/29/20 1715  BP: (!) 154/49 (!) 178/69  Pulse: 72 64  Resp: 20 18  Temp:  37.2 C  SpO2: 97% 96%    Last Pain:  Vitals:   01/29/20 1715  TempSrc: Oral  PainSc: 0-No pain                 Pervis Hocking

## 2020-01-29 NOTE — TOC Progression Note (Signed)
Transition of Care [facility]) - Progression Note    Patient Details  Name: Mary Wilcox MRN: [id number] Date of Birth: 04/08/1955  [facility] CM/SW Contact  Sharlet [place]ina Mila Homer, LCSW Phone Number: 01/29/2020, 5:17 PM  Clinical Narrative:  Visited with patient and bed offer provided: [facility]. Ms. Dedominicis agreeable to this facility. Call made to [facility] and talked with admissions director Shazma [phone]) and informed her about patient and that she may be ready for d/c tomorrow. Per Donneta Romberg, they can accept patient.  Attempted to reach patient's daughter [name] ([phone]), however her VM is full. CSW will continue to follow through discharge.      Expected Discharge Plan: [place] Barriers to Discharge: Continued Medical Work up  Expected Discharge Plan and Services Expected Discharge Plan: [place] In-house Referral: Clinical Social Work Discharge Planning Services: CM Consult Post Acute Care Choice: [place] Living arrangements for the past 2 months: Apartment                                       Social Determinants of Health (SDOH) Interventions    Readmission Risk Interventions Readmission Risk Prevention Plan 04/07/2019 04/04/2019 02/13/2019  Transportation Screening - Complete Complete  PCP or Specialist Appt within 3-5 Days Complete Not Complete Complete  Not Complete comments - plan for SNF -  [place] or [place] - Complete Complete  Social Work Consult for [place] Planning/Counseling - Complete Complete  Palliative Care Screening - Not Applicable Not Applicable  Medication Review (RN Care Manager) Complete Referral to Pharmacy Complete  Some recent data might be hidden

## 2020-01-29 NOTE — Anesthesia Procedure Notes (Signed)
Procedure Name: MAC Date/Time: 01/29/2020 12:54 PM Performed by: Lowella Dell, CRNA Pre-anesthesia Checklist: Patient identified, Emergency Drugs available, Suction available, Patient being monitored and Timeout performed Patient Re-evaluated:Patient Re-evaluated prior to induction Oxygen Delivery Method: Nasal cannula Placement Confirmation: positive ETCO2 Dental Injury: Teeth and Oropharynx as per pre-operative assessment

## 2020-01-29 NOTE — Progress Notes (Signed)
PROGRESS NOTE    EMANII BUGBEE  WFU:[id number] DOB: 26-Mar-1955 DOA: 01/22/2020 PCP: Kristen Loader, FNP   Brief Narrative: 63/F with peripheral vascular disease,  ESRD on hemodialysis, hypothyroidism, remote cervical cancer status post external radiation presented after missing hemodialysis.  -Patient is getting progressively weaker over the last 1 to 2 weeks and hence was unable to get down the stairs of her apartment complex to be transported to HD  -She called EMS for dialysis transport, when EMS arrived she was covered in feces and bedbugs. Evaluation in the ED: Noted to be fluid overloaded, hemoglobin was down to 8 from baseline of 11-12, Hemoccult positive, and potassium was 7.5  -Improved with dialysis and transfusion -Gastroenterology consulted for anemia work-up  Assessment & Plan:  Hyperkalemia: Volume overload ESRD on hemodialysis -Due to missed HD, dietary indiscretion -Patient received calcium gluconate, insulin and glucose and Lokelma. -Corrected with hemodialysis. -PT OT eval completed, SNF recommended  Acute on chronic anemia Anemia of chronic disease and heme positive stools -Hemoglobin down to 6.6 on 11/10, transfused 2 units of PRBC -Hemoccult positive on admission, also has anemia of chronic disease and hemodilution from fluid overload -Seen by gastroenterology on admission, recommended infectious work-up, C. difficile PCR is positive for antigen and negative for toxin, diarrhea has resolved hence suspect this is more consistent with colonization -Discussed with gastroenterology, patient's lack of social support and disease burden limit ability to pursue much outpatient work-up, hence plan for EGD colonoscopy today -Hemoglobin/stable improved posttransfusion and volume removal -CBC in a.m., continue EPO with HD  Hypothyroidism: Continue with Synthroid  Acute on chronic diarrhea: -resolved within a day of admission, C. difficile PCR positive for antigen but  negative for toxin which is likely secondary to colonization , if diarrhea recurs will Rx as infection   Obesity; -Needs weight loss/lifestyle modification  Chronic hypotension: Continue with midodrine.  DVT prophylaxis: SCDs Code Status: Full code Family Communication: Discussed with patient Disposition Plan:  Status is: Inpatient  Remains inpatient appropriate because:Ongoing diagnostic testing needed not appropriate for outpatient work up   Dispo: The patient is from: Home              Anticipated d/c is to: SNF              Anticipated d/c date is: Tomorrow 11/16              Patient currently is not medically stable to d/c.   Consultants:   Nephrology, GI  Procedures:   Hemodialysis  Antimicrobials:    Subjective: -Only consumed half her prep, bowels still not clear, colonoscopy postponed  Objective: Vitals:   01/29/20 1216 01/29/20 1331 01/29/20 1335 01/29/20 1341  BP: (!) 218/73 (!) 129/44 (!) 144/48 (!) 154/49  Pulse: 72 70 71 72  Resp: (!) 22 17 (!) 25 20  Temp: 98.8 F (37.1 C) 98 F (36.7 C)    TempSrc: Oral Oral    SpO2: 95% 98% 97% 97%  Weight:      Height:        Intake/Output Summary (Last 24 hours) at 01/29/2020 1405 Last data filed at 01/29/2020 1322 Gross per 24 hour  Intake 2263 ml  Output 0 ml  Net 2263 ml   Filed Weights   01/25/20 1551 01/27/20 1345 01/27/20 2141  Weight: 83.2 kg 80.3 kg 83.7 kg    Examination:  General exam: Obese chronically ill female sitting up in bed, AAOx3, no distress HEENT: Right IJ HD catheter noted  CVS: S1-S2, regular rate rhythm Lungs: Decreased breath sounds to bases Abdomen: Soft, nontender, bowel sounds present Extremities: Trace edema, left forearm with 1+ edema  Skin: No rashes on exposed skin   Data Reviewed: I have personally reviewed following labs and imaging studies  CBC: Recent Labs  Lab 01/24/20 0507 01/25/20 0047 01/26/20 0119 01/27/20 0135 01/29/20 0325  WBC 9.2 8.2 8.7  9.7 8.0  HGB 6.6* 9.4* 9.9* 10.1* 10.8*  HCT 20.7* 28.1* 30.0* 31.0* 33.2*  MCV 97.2 90.4 91.7 93.4 93.5  PLT 225 201 173 241 759   Basic Metabolic Panel: Recent Labs  Lab 01/24/20 0507 01/25/20 0047 01/26/20 0119 01/27/20 0135 01/29/20 0325  NA 131* 134* 135 132* 132*  K 5.0 3.9 3.7 3.8 4.0  CL 93* 96* 96* 96* 95*  CO2 20* 26 26 24 25   GLUCOSE 78 104* 112* 115* 74  BUN 49* 22 12 26* 16  CREATININE 11.57* 6.18* 4.32* 6.84* 5.83*  CALCIUM 8.5* 8.6* 9.0 9.3 10.6*  PHOS 6.6*  --   --   --   --    GFR: Estimated Creatinine Clearance: 8.9 mL/min (A) (by C-G formula based on SCr of 5.83 mg/dL (H)). Liver Function Tests: Recent Labs  Lab 01/24/20 0507  ALBUMIN 2.4*   No results for input(s): LIPASE, AMYLASE in the last 168 hours. No results for input(s): AMMONIA in the last 168 hours. Coagulation Profile: No results for input(s): INR, PROTIME in the last 168 hours. Cardiac Enzymes: No results for input(s): CKTOTAL, CKMB, CKMBINDEX, TROPONINI in the last 168 hours. BNP (last 3 results) No results for input(s): PROBNP in the last 8760 hours. HbA1C: No results for input(s): HGBA1C in the last 72 hours. CBG: Recent Labs  Lab 01/23/20 0026  GLUCAP 69*   Lipid Profile: No results for input(s): CHOL, HDL, LDLCALC, TRIG, CHOLHDL, LDLDIRECT in the last 72 hours. Thyroid Function Tests: No results for input(s): TSH, T4TOTAL, FREET4, T3FREE, THYROIDAB in the last 72 hours. Anemia Panel: No results for input(s): VITAMINB12, FOLATE, FERRITIN, TIBC, IRON, RETICCTPCT in the last 72 hours. Sepsis Labs: No results for input(s): PROCALCITON, LATICACIDVEN in the last 168 hours.  Recent Results (from the past 240 hour(s))  Respiratory Panel by RT PCR (Flu A&B, Covid) - Nasopharyngeal Swab     Status: None   Collection Time: 01/22/20  8:34 AM   Specimen: Nasopharyngeal Swab  Result Value Ref Range Status   SARS Coronavirus 2 by RT PCR NEGATIVE NEGATIVE Final    Comment:  (NOTE) SARS-CoV-2 target nucleic acids are NOT DETECTED.  The SARS-CoV-2 RNA is generally detectable in upper respiratoy specimens during the acute phase of infection. The lowest concentration of SARS-CoV-2 viral copies this assay can detect is 131 copies/mL. A negative result does not preclude SARS-Cov-2 infection and should not be used as the sole basis for treatment or other patient management decisions. A negative result may occur with  improper specimen collection/handling, submission of specimen other than nasopharyngeal swab, presence of viral mutation(s) within the areas targeted by this assay, and inadequate number of viral copies (<131 copies/mL). A negative result must be combined with clinical observations, patient history, and epidemiological information. The expected result is Negative.  Fact Sheet for Patients:  PinkCheek.be  Fact Sheet for Healthcare Providers:  GravelBags.it  This test is no t yet approved or cleared by the [place] FDA and  has been authorized for detection and/or diagnosis of SARS-CoV-2 by FDA under an Emergency Use Authorization (EUA). This EUA  will remain  in effect (meaning this test can be used) for the duration of the COVID-19 declaration under Section 564(b)(1) of the Act, 21 U.S.C. section 360bbb-3(b)(1), unless the authorization is terminated or revoked sooner.     Influenza A by PCR NEGATIVE NEGATIVE Final   Influenza B by PCR NEGATIVE NEGATIVE Final    Comment: (NOTE) The Xpert Xpress SARS-CoV-2/FLU/RSV assay is intended as an aid in  the diagnosis of influenza from Nasopharyngeal swab specimens and  should not be used as a sole basis for treatment. Nasal washings and  aspirates are unacceptable for Xpert Xpress SARS-CoV-2/FLU/RSV  testing.  Fact Sheet for Patients: PinkCheek.be  Fact Sheet for Healthcare  Providers: GravelBags.it  This test is not yet approved or cleared by the [place] FDA and  has been authorized for detection and/or diagnosis of SARS-CoV-2 by  FDA under an Emergency Use Authorization (EUA). This EUA will remain  in effect (meaning this test can be used) for the duration of the  Covid-19 declaration under Section 564(b)(1) of the Act, 21  U.S.C. section 360bbb-3(b)(1), unless the authorization is  terminated or revoked. Performed at [facility], [place] [address], [place], [place] [address]   Gastrointestinal Panel by PCR , Stool     Status: None   Collection Time: 01/22/20  9:39 AM   Specimen: Stool  Result Value Ref Range Status   Campylobacter species NOT DETECTED NOT DETECTED Final   Plesimonas shigelloides NOT DETECTED NOT DETECTED Final   Salmonella species NOT DETECTED NOT DETECTED Final   Yersinia enterocolitica NOT DETECTED NOT DETECTED Final   Vibrio species NOT DETECTED NOT DETECTED Final   Vibrio cholerae NOT DETECTED NOT DETECTED Final   Enteroaggregative E coli (EAEC) NOT DETECTED NOT DETECTED Final   Enteropathogenic E coli (EPEC) NOT DETECTED NOT DETECTED Final   Enterotoxigenic E coli (ETEC) NOT DETECTED NOT DETECTED Final   Shiga like toxin producing E coli (STEC) NOT DETECTED NOT DETECTED Final   Shigella/Enteroinvasive E coli (EIEC) NOT DETECTED NOT DETECTED Final   Cryptosporidium NOT DETECTED NOT DETECTED Final   Cyclospora cayetanensis NOT DETECTED NOT DETECTED Final   Entamoeba histolytica NOT DETECTED NOT DETECTED Final   Giardia lamblia NOT DETECTED NOT DETECTED Final   Adenovirus F40/41 NOT DETECTED NOT DETECTED Final   Astrovirus NOT DETECTED NOT DETECTED Final   Norovirus GI/GII NOT DETECTED NOT DETECTED Final   Rotavirus A NOT DETECTED NOT DETECTED Final   Sapovirus (I, II, IV, and V) NOT DETECTED NOT DETECTED Final    Comment: Performed at [facility], [address]  [place], [place] [address]  C Difficile Quick Screen w PCR reflex     Status: Abnormal   Collection Time: 01/22/20  9:39 AM   Specimen: STOOL  Result Value Ref Range Status   C Diff antigen POSITIVE (A) NEGATIVE Final   C Diff toxin NEGATIVE NEGATIVE Final   C Diff interpretation Results are indeterminate. See PCR results.  Final    Comment: Performed at [facility], [place] [address], [place], [place] [address]  C. Diff by PCR, Reflexed     Status: None   Collection Time: 01/22/20  9:39 AM  Result Value Ref Range Status   Toxigenic C. Difficile by PCR NEGATIVE NEGATIVE Final    Comment: Patient is colonized with non toxigenic C. difficile. May not need treatment unless significant symptoms are present. Performed at [facility], [place] [address], [place], [place] [address]  Scheduled Meds: . sodium chloride   Intravenous Once  . sodium chloride   Intravenous Once  . calcium acetate  667 mg Oral TID with meals  . Chlorhexidine Gluconate Cloth  6 each Topical Q0600  . cinacalcet  90 mg Oral QHS  . [START ON 02/01/2020] darbepoetin (ARANESP) injection - DIALYSIS  100 mcg Intravenous Q Thu-HD  . ferric citrate  420 mg Oral TID with meals  . fluticasone  1 spray Each Nare Daily  . folic acid  1 mg Oral Daily  . gabapentin  100 mg Oral BID  . heparin injection (subcutaneous)  5,000 Units Subcutaneous Q8H  . loratadine  10 mg Oral Daily  . midodrine  10 mg Oral Q T,Th,Sa-HD  . nystatin  1 application Topical BID  . pantoprazole (PROTONIX) IV  40 mg Intravenous Q12H  . polyethylene glycol-electrolytes  4,000 mL Oral Once  . sertraline  150 mg Oral Daily  . sodium chloride flush  3 mL Intravenous Q12H  . sodium zirconium cyclosilicate  10 g Oral Daily   Continuous Infusions:    LOS: 7 days    Time spent: 25 minutes  Domenic Polite, MD [facility]  01/29/2020, 2:05 PM

## 2020-01-29 NOTE — Progress Notes (Signed)
PT Cancellation Note  Patient Details Name: ERMAGENE SAIDI MRN: [id number] DOB: 19-Jun-1955   Cancelled Treatment:     pt going to endo for procedure, RN deferred session  Lyanne Co, DPT Acute Rehabilitation Services [phone]   [place] 01/29/2020, 11:49 AM

## 2020-01-29 NOTE — Interval H&P Note (Signed)
History and Physical Interval Note:  01/29/2020 12:15 PM  Mary Wilcox  has presented today for surgery, with the diagnosis of anemia.  The various methods of treatment have been discussed with the patient and family. After consideration of risks, benefits and other options for treatment, the patient has consented to  Procedure(s): ESOPHAGOGASTRODUODENOSCOPY (EGD) (N/A) COLONOSCOPY (N/A) as a surgical intervention.  The patient's history has been reviewed, patient examined, no change in status, stable for surgery.  I have reviewed the patient's chart and labs.  Questions were answered to the patient's satisfaction.     Lear Ng

## 2020-01-29 NOTE — Brief Op Note (Signed)
  No source of anemia seen on EGD/colon. Duodenal biopsies taken and will f/u as outpt. No further GI procedures planned. See endopro notes for complete findings/recs. Will sign off. Call if questions.

## 2020-01-29 NOTE — Plan of Care (Signed)
  Problem: Education: Goal: Knowledge of General Education information will improve Description Including pain rating scale, medication(s)/side effects and non-pharmacologic comfort measures Outcome: Progressing   Problem: Health Behavior/Discharge Planning: Goal: Ability to manage health-related needs will improve Outcome: Progressing   

## 2020-01-29 NOTE — Transfer of Care (Signed)
Immediate Anesthesia Transfer of Care Note  Patient: Mary Wilcox  Procedure(s) Performed: ESOPHAGOGASTRODUODENOSCOPY (EGD) (N/A ) COLONOSCOPY (N/A ) BIOPSY  Patient Location: PACU and Endoscopy Unit  Anesthesia Type:MAC  Level of Consciousness: drowsy and patient cooperative  Airway & Oxygen Therapy: Patient Spontanous Breathing and Patient connected to nasal cannula oxygen  Post-op Assessment: Report given to RN and Post -op Vital signs reviewed and stable  Post vital signs: Reviewed and stable  Last Vitals:  Vitals Value Taken Time  BP 129/44 01/29/20 1328  Temp    Pulse 73 01/29/20 1330  Resp 19 01/29/20 1330  SpO2 99 % 01/29/20 1330  Vitals shown include unvalidated device data.  Last Pain:  Vitals:   01/29/20 1216  TempSrc: Oral  PainSc: 0-No pain      Patients Stated Pain Goal: 0 (94/07/68 0881)  Complications: No complications documented.

## 2020-01-29 NOTE — Op Note (Signed)
[facility] Patient Name: Mary Wilcox Procedure Date : 01/29/2020 MRN: [id number] Attending MD: Lear Ng , MD Date of Birth: 11-13-1955 CSN: [id number] Age: 64 Admit Type: Inpatient Procedure:                Upper GI endoscopy Indications:              Iron deficiency anemia Providers:                Lear Ng, MD, Erenest Rasher, RN, Cletis Athens, Technician, Lesia Sago, Technician,                            Cira Servant, CRNA Referring MD:             hospital team Medicines:                Propofol per Anesthesia, Monitored Anesthesia Care Complications:            No immediate complications. Estimated Blood Loss:     Estimated blood loss was minimal. Procedure:                Pre-Anesthesia Assessment:                           - Prior to the procedure, a History and Physical                            was performed, and patient medications and                            allergies were reviewed. The patient's tolerance of                            previous anesthesia was also reviewed. The risks                            and benefits of the procedure and the sedation                            options and risks were discussed with the patient.                            All questions were answered, and informed consent                            was obtained. Prior Anticoagulants: The patient has                            taken no previous anticoagulant or antiplatelet                            agents. ASA Grade Assessment: III - A patient with  severe systemic disease. After reviewing the risks                            and benefits, the patient was deemed in                            satisfactory condition to undergo the procedure.                           After obtaining informed consent, the endoscope was                            passed under direct vision. Throughout the                             procedure, the patient's blood pressure, pulse, and                            oxygen saturations were monitored continuously. The                            GIF-H190 ([id number]) Olympus gastroscope was                            introduced through the mouth, and advanced to the                            second part of duodenum. The upper GI endoscopy was                            accomplished without difficulty. The patient                            tolerated the procedure well. Scope In: Scope Out: Findings:      The examined esophagus was normal.      The Z-line was regular and was found 36 cm from the incisors.      Segmental mild inflammation characterized by congestion (edema) and       erythema was found in the prepyloric region of the stomach.      The cardia and gastric fundus were normal on retroflexion.      The examined duodenum was normal. Biopsies for histology were taken with       a cold forceps for evaluation of celiac disease. Estimated blood loss       was minimal. Impression:               - Normal esophagus.                           - Z-line regular, 36 cm from the incisors.                           - Acute gastritis.                           - Normal examined  duodenum. Biopsied. Recommendation:           - Advance diet as tolerated and clear liquid diet.                           - Await pathology results. Procedure Code(s):        --- Professional ---                           [phone], Esophagogastroduodenoscopy, flexible,                            transoral; with biopsy, single or multiple Diagnosis Code(s):        --- Professional ---                           D50.9, Iron deficiency anemia, unspecified                           K29.00, Acute gastritis without bleeding CPT copyright 2019 American Medical Association. All rights reserved. The codes documented in this report are preliminary and upon coder review may  be revised to meet  current compliance requirements. Lear Ng, MD 01/29/2020 1:28:45 PM This report has been signed electronically. Number of Addenda: 0

## 2020-01-29 NOTE — Progress Notes (Signed)
[facility] Progress Note   Subjective:    Seen in room. Not feeling great this am with HA. No SOB, CP.   Objective Vitals:   01/28/20 1710 01/28/20 2107 01/29/20 0500 01/29/20 0609  BP: (!) 142/16 (!) 191/80 (!) 188/71 (!) 180/61  Pulse: 77 70 73   Resp: 18 18 18    Temp: 98 F (36.7 C) 98.3 F (36.8 C) 98.1 F (36.7 C)   TempSrc: Oral Oral Oral   SpO2: 98% 98% 98%   Weight:      Height:       Physical Exam General:WDWN female in NAD Heart:RRR Lungs:mostly CTAB Abdomen:soft, NTND Extremities:1+ upper and LE edema Dialysis Access: R IJ [facility]   Filed Weights   01/25/20 1551 01/27/20 1345 01/27/20 2141  Weight: 83.2 kg 80.3 kg 83.7 kg    Intake/Output Summary (Last 24 hours) at 01/29/2020 0852 Last data filed at 01/29/2020 0600 Gross per 24 hour  Intake 2280 ml  Output 0 ml  Net 2280 ml    Additional Objective Labs: Basic Metabolic Panel: Recent Labs  Lab 01/22/20 1139 01/23/20 0222 01/24/20 0507 01/25/20 0047 01/26/20 0119 01/27/20 0135 01/29/20 0325  NA 136   < > 131*   < > 135 132* 132*  K 5.6*   < > 5.0   < > 3.7 3.8 4.0  CL 97*   < > 93*   < > 96* 96* 95*  CO2 19*   < > 20*   < > 26 24 25   GLUCOSE 101*   < > 78   < > 112* 115* 74  BUN 85*   < > 49*   < > 12 26* 16  CREATININE 15.27*   < > 11.57*   < > 4.32* 6.84* 5.83*  CALCIUM 8.7*   < > 8.5*   < > 9.0 9.3 10.6*  PHOS 7.3*  --  6.6*  --   --   --   --    < > = values in this interval not displayed.   Liver Function Tests: Recent Labs  Lab 01/22/20 1139 01/24/20 0507  ALBUMIN 2.9* 2.4*   CBC: Recent Labs  Lab 01/24/20 0507 01/24/20 0507 01/25/20 0047 01/25/20 0047 01/26/20 0119 01/27/20 0135 01/29/20 0325  WBC 9.2   < > 8.2   < > 8.7 9.7 8.0  HGB 6.6*   < > 9.4*   < > 9.9* 10.1* 10.8*  HCT 20.7*   < > 28.1*   < > 30.0* 31.0* 33.2*  MCV 97.2  --  90.4  --  91.7 93.4 93.5  PLT 225   < > 201   < > 173 241 204   < > = values in this interval not displayed.    CBG: Recent Labs  Lab 01/23/20 0026  GLUCAP 69*    Medications:  . sodium chloride   Intravenous Once  . sodium chloride   Intravenous Once  . calcium acetate  667 mg Oral TID with meals  . Chlorhexidine Gluconate Cloth  6 each Topical Q0600  . cinacalcet  90 mg Oral QHS  . [START ON 02/01/2020] darbepoetin (ARANESP) injection - DIALYSIS  100 mcg Intravenous Q Thu-HD  . ferric citrate  420 mg Oral TID with meals  . fluticasone  1 spray Each Nare Daily  . folic acid  1 mg Oral Daily  . gabapentin  100 mg Oral BID  . heparin injection (subcutaneous)  5,000 Units Subcutaneous Q8H  .  loratadine  10 mg Oral Daily  . midodrine  10 mg Oral Q T,Th,Sa-HD  . nystatin  1 application Topical BID  . pantoprazole (PROTONIX) IV  40 mg Intravenous Q12H  . polyethylene glycol-electrolytes  4,000 mL Oral Once  . sertraline  150 mg Oral Daily  . sodium chloride flush  3 mL Intravenous Q12H  . sodium zirconium cyclosilicate  10 g Oral Daily    Dialysis Orders: TTS [place], 3:45h, 160 NR, 400/800, 1k/2.5Ca, EDW 82kg CVC Mircera 100 q2wks , venofer 50 weekly Last HD 10/30 - then 10/23, 10/16. Post wt 81.6kg.  Assessment/Plan: 1.Hyperkalemia- Generally running on 1Kasoutpt.  Low K diet. Lokelma 10 daily here for now. Resolved with HD. K+ 4.0  2. Volume overload- significant volume overload on admit. Under dry weight post dialysis ~ 80.3kg Continues to have  edema. Tolerating UF 3.5-4L.  Continue to titrate down volume, will need lower dry weight on discharge. 3. ?GIB- +hemoccult. EGD/colonscopy rescheduled due to inadequate bowel prep.    4. ESRD- on HD TTS.Missing HD d/t not being able to make it from 2nd story apartment per pt, CM/SW working on other arrangements, ?SNF.  Next HD 11/16. 5. Anemiaof CKD -Hgb 10.8 s/p 1 unit pRBC on 11/10. Dropped to 6.6 from 8.7 after admission likely d/t #2. tsat 36%.  Aranesp 100 q Thus  6. Secondary hyperparathyroidism- outpt PTH 339  on 10/23. Ca in goal. Last phos elevated. Continue binders and sensipar.  7.HTN- BP well controlled today.On midodrine for BP support as OP with HD.  8. Nutrition- Renal diet w/fluid restrictions.  9. Dispo - PT recommending SNF-  Lynnda Child PA-C [facility] 01/29/2020,8:52 AM

## 2020-01-29 NOTE — Care Management Important Message (Signed)
Important Message  Patient Details  Name: ARIANN KHAIMOV MRN: [id number] Date of Birth: 09/14/1955   Medicare Important Message Given:  Yes - Important Message mailed due to current National Emergency  Verbal consent obtained due to current National Emergency  Relationship to patient: Self Contact Name: Kiauna Zywicki Call Date: 01/29/20  Time: 12 Phone: [phone] Outcome: No Answer/Busy Important Message mailed to: Patient address on file    Delorse Lek 01/29/2020, 1:20 PM

## 2020-01-29 NOTE — Consult Note (Signed)
   [facility] Inpatient Consult   01/29/2020  Mary Wilcox November 08, 1955 [id number]   [facility] [ACO] Patient:  Medicare   Patient screened for high risk score for unplanned readmission score and for length of stay hospitalization.   Review of patient's medical record reveals patient was being recommended for a skilled nursing facility stay for rehab.  Primary Care Provider is Kristen Loader, FNP, [facility] Physician this provider is listed to provide the transition of care [TOC] for post hospital follow up.  Plan: Follow up with inpatient [facility] team notes reveals patient is for a skilled nursing facility level of care. Continue to follow progress and disposition to assess for post hospital care management needs.    Please place a [facility] Care Management consult as appropriate and for questions contact:   Natividad Brood, RN BSN [facility] Liaison  [phone] business mobile phone Toll free office [phone]  Fax number: [phone] [email or web address]

## 2020-01-30 ENCOUNTER — Encounter (HOSPITAL_COMMUNITY): Payer: Self-pay | Admitting: Gastroenterology

## 2020-01-30 DIAGNOSIS — E875 Hyperkalemia: Secondary | ICD-10-CM | POA: Diagnosis not present

## 2020-01-30 LAB — BASIC METABOLIC PANEL
Anion gap: 12 (ref 5–15)
BUN: 27 mg/dL — ABNORMAL HIGH (ref 8–23)
CO2: 24 mmol/L (ref 22–32)
Calcium: 11.1 mg/dL — ABNORMAL HIGH (ref 8.9–10.3)
Chloride: 96 mmol/L — ABNORMAL LOW (ref 98–111)
Creatinine, Ser: 7.68 mg/dL — ABNORMAL HIGH (ref 0.44–1.00)
GFR, Estimated: 5 mL/min — ABNORMAL LOW (ref >60)
Glucose, Bld: 102 mg/dL — ABNORMAL HIGH (ref 70–99)
Potassium: 3.9 mmol/L (ref 3.5–5.1)
Sodium: 132 mmol/L — ABNORMAL LOW (ref 135–145)

## 2020-01-30 LAB — CBC
HCT: 31 % — ABNORMAL LOW (ref 36.0–46.0)
Hemoglobin: 10.1 g/dL — ABNORMAL LOW (ref 12.0–15.0)
MCH: 30.1 pg (ref 26.0–34.0)
MCHC: 32.6 g/dL (ref 30.0–36.0)
MCV: 92.5 fL (ref 80.0–100.0)
Platelets: 247 10*3/uL (ref 150–400)
RBC: 3.35 MIL/uL — ABNORMAL LOW (ref 3.87–5.11)
RDW: 16.2 % — ABNORMAL HIGH (ref 11.5–15.5)
WBC: 8.5 10*3/uL (ref 4.0–10.5)
nRBC: 0 % (ref 0.0–0.2)

## 2020-01-30 LAB — SARS CORONAVIRUS 2 BY RT PCR (HOSPITAL ORDER, PERFORMED IN ~~LOC~~ HOSPITAL LAB): SARS Coronavirus 2: NEGATIVE

## 2020-01-30 LAB — SURGICAL PATHOLOGY

## 2020-01-30 MED ORDER — HEPARIN SODIUM (PORCINE) 1000 UNIT/ML IJ SOLN
INTRAMUSCULAR | Status: AC
  Filled 2020-01-30: qty 4

## 2020-01-30 MED ORDER — MIDODRINE HCL 5 MG PO TABS
ORAL_TABLET | ORAL | Status: AC
  Filled 2020-01-30: qty 2

## 2020-01-30 MED ORDER — FERRIC CITRATE 1 GM 210 MG(FE) PO TABS: 210.0000 mg | ORAL_TABLET | Freq: Two times a day (BID) | ORAL | Status: AC

## 2020-01-30 NOTE — Progress Notes (Signed)
Pt left for HD, still alert oriented x 4

## 2020-01-30 NOTE — Progress Notes (Signed)
Physical Therapy Treatment Patient Details Name: Mary Wilcox MRN: [id number] DOB: 1955-12-28 Today's Date: 01/30/2020    History of Present Illness 64yo female who has been missing HD sessions as she did not like having to take EMS to get to dialysis. Found covered in feces and bed bugs in her apartment. Admitted with hyperkalemia. PMH R eye cataracts, cervical cancer, CKD, ESRD on HD, PVD    PT Comments    Patient received in bed, states "I don't feel up to doing anything because my right leg hurts", but agreeable to letting PT examine it. R ankle definitely more plantarflexed and inverted than the left- able to passively bring it back into neutral position, however active positioning limited by significant heel cord/gastroc tightness and gross weakness. Feel some hip weakness and immobility is also playing into her R LE pain and her tendency to externally rotate this limb. Performed heel cord stretches and ankle dorsiflexion joint mobilizations bilaterally with L ankle much more mobile than right, note high arches bilaterally. Education provided on f/u at her next facility as well as benefits of supportive foot wear and what to expect in terms of rehab in the SNF setting. Left in bed with all needs met, bed alarm active.   Follow Up Recommendations  SNF;Supervision/Assistance - 24 hour     Equipment Recommendations  Rolling walker with 5" wheels;3in1 (PT);Wheelchair (measurements PT);Wheelchair cushion (measurements PT)    Recommendations for Other Services       Precautions / Restrictions Precautions Precautions: Fall;Other (comment) Precaution Comments: very weak, poor eccentric control, hx of bedbugs Restrictions Weight Bearing Restrictions: No    Mobility  Bed Mobility               General bed mobility comments: deferred- bed level session  Transfers                 General transfer comment: deferred- bed level session  Ambulation/Gait              General Gait Details: deferred- bed level session   Stairs             Wheelchair Mobility    Modified Rankin (Stroke Patients Only)       Balance Overall balance assessment: Needs assistance Sitting-balance support: No upper extremity supported;Feet supported   Sitting balance - Comments: statically with min guard when feet are touching.  Poor without support of feet.   Standing balance support: Bilateral upper extremity supported;During functional activity;Single extremity supported Standing balance-Leahy Scale: Poor Standing balance comment: requires UE and external support +2                            Cognition Arousal/Alertness: Awake/alert Behavior During Therapy: WFL for tasks assessed/performed;Flat affect Overall Cognitive Status: Impaired/Different from baseline                         Following Commands: Follows one step commands consistently;Follows one step commands with increased time Safety/Judgement: Decreased awareness of safety;Decreased awareness of deficits Awareness: Emergent Problem Solving: Slow processing;Requires verbal cues General Comments: cognition improving, still needs increased processing time but engages appropriately and able to ask questions relevant to her current situation      Exercises      General Comments General comments (skin integrity, edema, etc.): bed level session today- likely [place] to SNF this afternoon or in the morning      Pertinent  Vitals/Pain Pain Assessment: 0-10 Pain Score: 3  Pain Location: tingling in B feet, R>L Pain Descriptors / Indicators: Tingling Pain Intervention(s): Limited activity within patient's tolerance;Monitored during session    Home Living                      Prior Function            PT Goals (current goals can now be found in the care plan section) Acute Rehab PT Goals Patient Stated Goal: get stronger, go to rehab PT Goal Formulation: With  patient Time For Goal Achievement: 02/06/20 Potential to Achieve Goals: Fair Progress towards PT goals: Progressing toward goals    Frequency    Min 2X/week      PT Plan Current plan remains appropriate    Co-evaluation              AM-PAC PT "6 Clicks" Mobility   Outcome Measure  Help needed turning from your back to your side while in a flat bed without using bedrails?: A Little Help needed moving from lying on your back to sitting on the side of a flat bed without using bedrails?: A Lot Help needed moving to and from a bed to a chair (including a wheelchair)?: A Lot Help needed standing up from a chair using your arms (e.g., wheelchair or bedside chair)?: A Lot Help needed to walk in hospital room?: A Lot Help needed climbing 3-5 steps with a railing? : A Lot 6 Click Score: 13    End of Session   Activity Tolerance: Patient tolerated treatment well Patient left: in bed;with call bell/phone within reach;with bed alarm set Nurse Communication: Mobility status PT Visit Diagnosis: Unsteadiness on feet (R26.81);Difficulty in walking, not elsewhere classified (R26.2);Muscle weakness (generalized) (M62.81)     Time: 8469-6295 PT Time Calculation (min) (ACUTE ONLY): 15 min  Charges:  $Therapeutic Activity: 8-22 mins                     Windell Norfolk, DPT, PN1   Supplemental Physical Therapist [place]    Pager [phone] Acute Rehab Office [phone]

## 2020-01-30 NOTE — Plan of Care (Signed)
  Problem: Education: Goal: Knowledge of General Education information will improve Description: Including pain rating scale, medication(s)/side effects and non-pharmacologic comfort measures Outcome: Completed/Met   Problem: Health Behavior/Discharge Planning: Goal: Ability to manage health-related needs will improve Outcome: Completed/Met

## 2020-01-30 NOTE — Progress Notes (Signed)
Called [place] gave report to [employer] LPN.

## 2020-01-30 NOTE — Discharge Summary (Signed)
Physician Discharge Summary  Mary Wilcox:[id number] DOB: 05/11/1955 DOA: 01/22/2020  PCP: Kristen Loader, FNP  Admit date: 01/22/2020 Discharge date: 01/30/2020  Time spent: 35 minutes  Recommendations for Outpatient Follow-up:  1. PCP in 1 week, please monitor CBC 2. Continue to lower dry weight at hemodialysis as tolerated   Discharge Diagnoses:  Hyperkalemia Volume overload ESRD on hemodialysis Acute on chronic anemia Anemia of chronic disease Heme positive stools   Chronic kidney disease, stage V requiring chronic dialysis ([place])   Hypothyroidism   High risk social situation   Anemia   Class 2 obesity due to excess calories with body mass index (BMI) of 35.0 to 35.9 in adult   Discharge Condition: Stable  Diet recommendation: Renal diet  Filed Weights   01/30/20 0400 01/30/20 0700 01/30/20 1039  Weight: 86.5 kg 83.5 kg 80 kg    History of present illness:  63/F with peripheral vascular disease,  ESRD on hemodialysis, hypothyroidism, remote cervical cancer status post external radiation presented after missing hemodialysis.  -Patient is getting progressively weaker over the last 1 to 2 weeks and hence was unable to get down the stairs of [place] apartment complex to be transported to HD  -She called EMS for dialysis transport, when EMS arrived she was covered in feces and bedbugs. Evaluation in the ED: Noted to be fluid overloaded, hemoglobin was down to 8 from baseline of 11-12, Hemoccult positive, and potassium was 7.5  Hospital Course:   Hyperkalemia: Volume overload ESRD on hemodialysis -Due to missed HD, dietary indiscretion -Treated with calcium gluconate, insulin and glucose and Lokelma. -Improved with extra hemodialysis -Remains slightly volume overloaded at discharge, will need to continue to lower dry weight on dialysis as tolerated -PT OT eval completed, she will be discharged to SNF for short-term rehab  Acute on chronic anemia Anemia of chronic  disease and heme positive stools -Hemoglobin down to 6.6 on 11/10, transfused 2 units of PRBC -Hemoccult positive on admission, also has anemia of chronic disease and hemodilution from fluid overload -Seen by gastroenterology on admission, she had EGD and colonoscopy performed this admission which was unremarkable -Hemoglobin stable in the 9-10 range now -continue EPO with HD  Hypothyroidism:  -Continue synthroid  Acute on chronic diarrhea: -resolved within a day of admission, C. difficile PCR positive for antigen but negative for toxin which is likely secondary to colonization, she did not have ongoing diarrhea at this admission   Obesity; -Needs weight loss/lifestyle modification  Chronic hypotension: Continue midodrine on dialysis days only  Obesity -Needs lifestyle modification   Discharge Exam: Vitals:   01/30/20 1030 01/30/20 1039  BP: (!) 156/89 (!) 156/89  Pulse: 72 72  Resp: 15 15  Temp:    SpO2:  100%    General: Awake alert oriented x3, no distress Cardiovascular: S1-S2, regular rate rhythm Respiratory: Distant breath sounds the bases  Discharge Instructions   Discharge Instructions    Diet - low sodium heart healthy   Complete by: As directed    Discharge wound care:   Complete by: As directed    Routine wound care   Increase activity slowly   Complete by: As directed      Allergies as of 01/30/2020      Reactions   Prozac [fluoxetine Hcl] Hives      Medication List    TAKE these medications   acetaminophen 500 MG tablet Commonly known as: TYLENOL Take 1,000 mg by mouth every 8 (eight) hours as needed for  moderate pain or headache.   b complex vitamins tablet Take 1 tablet by mouth daily.   calcium acetate 667 MG capsule Commonly known as: PHOSLO Take 667 mg by mouth See admin instructions. Take 1 tablet (667 mg totally) by mouth with meals and take 1 tablet (667 mg totally) with snacks   cinacalcet 90 MG tablet Commonly known as:  SENSIPAR Take 90 mg by mouth at bedtime.   cyanocobalamin 2000 MCG tablet Take 2,000 mcg by mouth daily.   diphenhydrAMINE 25 MG tablet Commonly known as: BENADRYL Take 25 mg by mouth daily as needed for allergies.   ferric citrate 1 GM 210 MG(Fe) tablet Commonly known as: Auryxia Take 1 tablet (210 mg total) by mouth 2 (two) times daily with a meal. Take 2 tablets (420mg  totally) by mouth three times daily with meals and snacks What changed:   how much to take  when to take this   gabapentin 100 MG capsule Commonly known as: NEURONTIN Take 100 mg by mouth in the morning and at bedtime.   Lokelma 10 g Pack packet Generic drug: sodium zirconium cyclosilicate Take 10 g by mouth daily as needed (high potassium).   midodrine 10 MG tablet Commonly known as: PROAMATINE Take 10 mg by mouth See admin instructions. Take 10 mg by mouth twice on dialysis days (Tuesday, Thursday, Saturday) at 4am and 8am   Proventil HFA 108 (90 Base) MCG/ACT inhaler Generic drug: albuterol Inhale 2 puffs into the lungs every 6 (six) hours as needed for wheezing or shortness of breath.   sertraline 100 MG tablet Commonly known as: ZOLOFT Take 100 mg by mouth in the morning and at bedtime.   zolpidem 10 MG tablet Commonly known as: AMBIEN Take 10 mg by mouth at bedtime as needed for sleep.            Discharge Care Instructions  (From admission, onward)         Start     Ordered   01/30/20 0000  Discharge wound care:       Comments: Routine wound care   01/30/20 1007         Allergies  Allergen Reactions  . Prozac [Fluoxetine Hcl] Hives      The results of significant diagnostics from this hospitalization (including imaging, microbiology, ancillary and laboratory) are listed below for reference.    Significant Diagnostic Studies: DG Chest Port 1 View  Result Date: 01/22/2020 CLINICAL DATA:  Leg swelling.  Missed dialysis. EXAM: PORTABLE CHEST 1 VIEW COMPARISON:  04/03/2019  FINDINGS: Right IJ approach hemodialysis catheter terminates at the level of the superior cavoatrial junction. Partially visualized left axillary and proximal left upper extremity stent. The heart size and mediastinal contours are within normal limits. No focal airspace consolidation, pleural effusion, or pneumothorax. The visualized skeletal structures are unremarkable. IMPRESSION: No acute cardiopulmonary findings. Electronically Signed   By: Davina Poke D.O.   On: 01/22/2020 08:19    Microbiology: Recent Results (from the past 240 hour(s))  Respiratory Panel by RT PCR (Flu A&B, Covid) - Nasopharyngeal Swab     Status: None   Collection Time: 01/22/20  8:34 AM   Specimen: Nasopharyngeal Swab  Result Value Ref Range Status   SARS Coronavirus 2 by RT PCR NEGATIVE NEGATIVE Final    Comment: (NOTE) SARS-CoV-2 target nucleic acids are NOT DETECTED.  The SARS-CoV-2 RNA is generally detectable in upper respiratoy specimens during the acute phase of infection. The lowest concentration of SARS-CoV-2 viral copies this  assay can detect is 131 copies/mL. A negative result does not preclude SARS-Cov-2 infection and should not be used as the sole basis for treatment or other patient management decisions. A negative result may occur with  improper specimen collection/handling, submission of specimen other than nasopharyngeal swab, presence of viral mutation(s) within the areas targeted by this assay, and inadequate number of viral copies (<131 copies/mL). A negative result must be combined with clinical observations, patient history, and epidemiological information. The expected result is Negative.  Fact Sheet for Patients:  PinkCheek.be  Fact Sheet for Healthcare Providers:  GravelBags.it  This test is no t yet approved or cleared by the [place] FDA and  has been authorized for detection and/or diagnosis of SARS-CoV-2 by FDA  under an Emergency Use Authorization (EUA). This EUA will remain  in effect (meaning this test can be used) for the duration of the COVID-19 declaration under Section 564(b)(1) of the Act, 21 U.S.C. section 360bbb-3(b)(1), unless the authorization is terminated or revoked sooner.     Influenza A by PCR NEGATIVE NEGATIVE Final   Influenza B by PCR NEGATIVE NEGATIVE Final    Comment: (NOTE) The Xpert Xpress SARS-CoV-2/FLU/RSV assay is intended as an aid in  the diagnosis of influenza from Nasopharyngeal swab specimens and  should not be used as a sole basis for treatment. Nasal washings and  aspirates are unacceptable for Xpert Xpress SARS-CoV-2/FLU/RSV  testing.  Fact Sheet for Patients: PinkCheek.be  Fact Sheet for Healthcare Providers: GravelBags.it  This test is not yet approved or cleared by the [place] FDA and  has been authorized for detection and/or diagnosis of SARS-CoV-2 by  FDA under an Emergency Use Authorization (EUA). This EUA will remain  in effect (meaning this test can be used) for the duration of the  Covid-19 declaration under Section 564(b)(1) of the Act, 21  U.S.C. section 360bbb-3(b)(1), unless the authorization is  terminated or revoked. Performed at [facility], [place] [address], [place], [place] [address]   Gastrointestinal Panel by PCR , Stool     Status: None   Collection Time: 01/22/20  9:39 AM   Specimen: Stool  Result Value Ref Range Status   Campylobacter species NOT DETECTED NOT DETECTED Final   Plesimonas shigelloides NOT DETECTED NOT DETECTED Final   Salmonella species NOT DETECTED NOT DETECTED Final   Yersinia enterocolitica NOT DETECTED NOT DETECTED Final   Vibrio species NOT DETECTED NOT DETECTED Final   Vibrio cholerae NOT DETECTED NOT DETECTED Final   Enteroaggregative E coli (EAEC) NOT DETECTED NOT DETECTED Final   Enteropathogenic E coli (EPEC) NOT DETECTED NOT  DETECTED Final   Enterotoxigenic E coli (ETEC) NOT DETECTED NOT DETECTED Final   Shiga like toxin producing E coli (STEC) NOT DETECTED NOT DETECTED Final   Shigella/Enteroinvasive E coli (EIEC) NOT DETECTED NOT DETECTED Final   Cryptosporidium NOT DETECTED NOT DETECTED Final   Cyclospora cayetanensis NOT DETECTED NOT DETECTED Final   Entamoeba histolytica NOT DETECTED NOT DETECTED Final   Giardia lamblia NOT DETECTED NOT DETECTED Final   Adenovirus F40/41 NOT DETECTED NOT DETECTED Final   Astrovirus NOT DETECTED NOT DETECTED Final   Norovirus GI/GII NOT DETECTED NOT DETECTED Final   Rotavirus A NOT DETECTED NOT DETECTED Final   Sapovirus (I, II, IV, and V) NOT DETECTED NOT DETECTED Final    Comment: Performed at [facility], [address], [place], [place] [address]  C Difficile Quick Screen w PCR reflex     Status:  Abnormal   Collection Time: 01/22/20  9:39 AM   Specimen: STOOL  Result Value Ref Range Status   C Diff antigen POSITIVE (A) NEGATIVE Final   C Diff toxin NEGATIVE NEGATIVE Final   C Diff interpretation Results are indeterminate. See PCR results.  Final    Comment: Performed at [facility], [place] [address], [place], [place] [address]  C. Diff by PCR, Reflexed     Status: None   Collection Time: 01/22/20  9:39 AM  Result Value Ref Range Status   Toxigenic C. Difficile by PCR NEGATIVE NEGATIVE Final    Comment: Patient is colonized with non toxigenic C. difficile. May not need treatment unless significant symptoms are present. Performed at [facility], [place] [address], [place], [place] [address]      Labs: Basic Metabolic Panel: Recent Labs  Lab 01/24/20 0507 01/24/20 0507 01/25/20 0047 01/26/20 0119 01/27/20 0135 01/29/20 0325 01/30/20 0214  NA 131*   < > 134* 135 132* 132* 132*  K 5.0   < > 3.9 3.7 3.8 4.0 3.9  CL 93*   < > 96* 96* 96* 95* 96*  CO2 20*   < > 26 26 24 25 24   GLUCOSE 78   < > 104* 112* 115* 74 102*  BUN 49*   < > 22 12  26* 16 27*  CREATININE 11.57*   < > 6.18* 4.32* 6.84* 5.83* 7.68*  CALCIUM 8.5*   < > 8.6* 9.0 9.3 10.6* 11.1*  PHOS 6.6*  --   --   --   --   --   --    < > = values in this interval not displayed.   Liver Function Tests: Recent Labs  Lab 01/24/20 0507  ALBUMIN 2.4*   No results for input(s): LIPASE, AMYLASE in the last 168 hours. No results for input(s): AMMONIA in the last 168 hours. CBC: Recent Labs  Lab 01/25/20 0047 01/26/20 0119 01/27/20 0135 01/29/20 0325 01/30/20 0214  WBC 8.2 8.7 9.7 8.0 8.5  HGB 9.4* 9.9* 10.1* 10.8* 10.1*  HCT 28.1* 30.0* 31.0* 33.2* 31.0*  MCV 90.4 91.7 93.4 93.5 92.5  PLT 201 173 241 204 247   Cardiac Enzymes: No results for input(s): CKTOTAL, CKMB, CKMBINDEX, TROPONINI in the last 168 hours. BNP: BNP (last 3 results) No results for input(s): BNP in the last 8760 hours.  ProBNP (last 3 results) No results for input(s): PROBNP in the last 8760 hours.  CBG: Recent Labs  Lab 01/29/20 2219  GLUCAP 102*       Signed:  Domenic Polite MD.  [facility] 01/30/2020, 10:46 AM

## 2020-01-30 NOTE — Progress Notes (Signed)
HD personnel called, updated pt condition about v/s, informed that SBP=120mmhg, did not gave any anti hypertensive drug, since pt is due for HD,  Agreed about the management and said will get the pt anytime for HD

## 2020-01-30 NOTE — Progress Notes (Signed)
Hemodialysis- Treatment discontinued with 11 minutes remaining due to TMP+40 and dialyzer attempting to clot despite frequent flushing (no heparin tx). Reported off to primary RN. Patient currently without complaints. All vitals WNL.

## 2020-01-30 NOTE — Progress Notes (Signed)
[facility] Progress Note   Subjective:    Seen in HD unit. Tolerating 4L UF No complaints.  Colonoscopy/EGD yesterday -no active bleeding source found  Objective Vitals:   01/30/20 0900 01/30/20 0930 01/30/20 1000 01/30/20 1030  BP: (!) 149/79 139/75 138/86 (!) 156/89  Pulse: 67 71 66 72  Resp: 16  16 15   Temp:      TempSrc:      SpO2:      Weight:      Height:       Physical Exam General:WDWN female in NAD Heart:RRR Lungs:mostly CTAB Abdomen:soft, NTND Extremities:1+ upper and LE edema Dialysis Access: R IJ [facility]   Filed Weights   01/29/20 2218 01/30/20 0400 01/30/20 0700  Weight: 86.5 kg 86.5 kg 83.5 kg    Intake/Output Summary (Last 24 hours) at 01/30/2020 1038 Last data filed at 01/30/2020 0900 Gross per 24 hour  Intake 513 ml  Output --  Net 513 ml    Additional Objective Labs: Basic Metabolic Panel: Recent Labs  Lab 01/24/20 0507 01/25/20 0047 01/27/20 0135 01/29/20 0325 01/30/20 0214  NA 131*   < > 132* 132* 132*  K 5.0   < > 3.8 4.0 3.9  CL 93*   < > 96* 95* 96*  CO2 20*   < > 24 25 24   GLUCOSE 78   < > 115* 74 102*  BUN 49*   < > 26* 16 27*  CREATININE 11.57*   < > 6.84* 5.83* 7.68*  CALCIUM 8.5*   < > 9.3 10.6* 11.1*  PHOS 6.6*  --   --   --   --    < > = values in this interval not displayed.   Liver Function Tests: Recent Labs  Lab 01/24/20 0507  ALBUMIN 2.4*   CBC: Recent Labs  Lab 01/25/20 0047 01/25/20 0047 01/26/20 0119 01/26/20 0119 01/27/20 0135 01/29/20 0325 01/30/20 0214  WBC 8.2   < > 8.7   < > 9.7 8.0 8.5  HGB 9.4*   < > 9.9*   < > 10.1* 10.8* 10.1*  HCT 28.1*   < > 30.0*   < > 31.0* 33.2* 31.0*  MCV 90.4  --  91.7  --  93.4 93.5 92.5  PLT 201   < > 173   < > 241 204 247   < > = values in this interval not displayed.   CBG: Recent Labs  Lab 01/29/20 2219  GLUCAP 102*    Medications:  . sodium chloride   Intravenous Once  . sodium chloride   Intravenous Once  . calcium acetate  667 mg  Oral TID with meals  . Chlorhexidine Gluconate Cloth  6 each Topical Q0600  . cinacalcet  90 mg Oral QHS  . [START ON 02/01/2020] darbepoetin (ARANESP) injection - DIALYSIS  100 mcg Intravenous Q Thu-HD  . ferric citrate  420 mg Oral TID with meals  . fluticasone  1 spray Each Nare Daily  . folic acid  1 mg Oral Daily  . gabapentin  100 mg Oral BID  . heparin injection (subcutaneous)  5,000 Units Subcutaneous Q8H  . heparin sodium (porcine)      . loratadine  10 mg Oral Daily  . midodrine      . midodrine  10 mg Oral Q T,Th,Sa-HD  . nystatin  1 application Topical BID  . pantoprazole (PROTONIX) IV  40 mg Intravenous Q12H  . polyethylene glycol-electrolytes  4,000 mL Oral Once  .  sertraline  150 mg Oral Daily  . sodium chloride flush  3 mL Intravenous Q12H  . sodium zirconium cyclosilicate  10 g Oral Daily    Dialysis Orders: TTS [facility], 3:45h, 160 NR, 400/800, 1k/2.5Ca, EDW 82kg CVC Mircera 100 q2wks , venofer 50 weekly Last HD 10/30 - then 10/23, 10/16. Post wt 81.6kg.  Assessment/Plan: 1.Hyperkalemia- Generally running on 1Kasoutpt.  Low K diet. Lokelma 10 daily here for now. Resolved with HD.  2. Volume overload- significant volume overload on admit. Under dry weight. Continues to have  edema. Tolerating UF 3.5-4L.  Continue to titrate down volume, will need lower dry weight on discharge. 3. ?GIB- +hemoccult. EGD/colonscopy on 11/15 -Internal hemorrhoids but no active bleeding found.  4. ESRD- on HD TTS.Missing HD d/t not being able to make it from 2nd story apartment per pt, CM/SW working on other arrangements, ?SNF.  Next HD 11/16. 5. Anemiaof CKD -Hgb 10.1 s/p 1 unit pRBC on 11/10. Dropped to 6.6 from 8.7 after admission likely d/t #2. tsat 36%.  Aranesp 100 q Thus  6. Secondary hyperparathyroidism- outpt PTH 339 on 10/23. Ca in goal. Last phos elevated. Continue binders and sensipar.  7.HTN- BP well controlled today.On midodrine for BP support as OP  with HD.  8. Nutrition- Renal diet w/fluid restrictions.  9. Dispo - PT recommending SNF-  Mary Child PA-C [facility] 01/30/2020,10:38 AM

## 2020-01-30 NOTE — TOC Transition Note (Signed)
Transition of Care [facility]) - CM/SW Discharge Note *Discharged to [place]   Patient Details  Name: Mary [place] MRN: [id number] Date of Birth: Jan 07, 1956  [facility]) CM/SW Contact:  Sable Feil, LCSW Phone Number: 01/30/2020, 5:34 PM   Clinical Narrative:  Patient medially stable for discharge and going to [facility]hab for [place] rehab today. Discharge clinicals transmitted to facility and transportation arranged via [facility]. Daughter contacted and advised of discharge.     Final next level of care: [place] ([place]) Barriers to Discharge: Barriers Resolved   Patient Goals and CMS Choice Patient states their goals for this hospitalization and ongoing recovery are:: Patient agreeable to ST rehab before returning home CMS Medicare.gov Compare Post Acute Care list provided to:: Patient Choice offered to / list presented to : Patient  Discharge Placement   Existing PASRR number confirmed : 01/23/20          Patient chooses bed at: [facility] Patient to be transferred to facility by: Non-emergency ambulance transport Name of family member notified: Daughter [facility]; called and VM full; HIPPA compliant text sent to daughter regarding patient's discharge Patient and family notified of of transfer: 01/30/20  Discharge Plan and Services In-house Referral: Clinical Social Work Discharge Planning Services: CM Consult Post Acute Care Choice: [place]                              Social Determinants of Health (SDOH) Interventions  No SDOH interventions requested or needed at discharge   Readmission Risk Interventions Readmission Risk Prevention Plan 04/07/2019 04/04/2019 02/13/2019  Transportation Screening - Complete Complete  PCP or Specialist Appt within 3-5 Days Complete Not Complete Complete  Not Complete comments - plan for SNF -  [facility] or [facility] - Complete Complete   Social Work Consult for [place] Planning/Counseling - Complete Complete  Palliative Care Screening - Not Applicable Not Applicable  Medication Review Press photographer) Complete Referral to Pharmacy Complete  Some recent data might be hidden

## 2020-01-31 DIAGNOSIS — Z992 Dependence on renal dialysis: Secondary | ICD-10-CM | POA: Diagnosis not present

## 2020-01-31 DIAGNOSIS — N186 End stage renal disease: Secondary | ICD-10-CM | POA: Diagnosis not present

## 2020-01-31 DIAGNOSIS — N2581 Secondary hyperparathyroidism of renal origin: Secondary | ICD-10-CM | POA: Diagnosis not present

## 2020-01-31 DIAGNOSIS — N139 Obstructive and reflux uropathy, unspecified: Secondary | ICD-10-CM | POA: Diagnosis not present

## 2020-01-31 DIAGNOSIS — D631 Anemia in chronic kidney disease: Secondary | ICD-10-CM | POA: Diagnosis not present

## 2020-01-31 DIAGNOSIS — I739 Peripheral vascular disease, unspecified: Secondary | ICD-10-CM | POA: Diagnosis not present

## 2020-01-31 DIAGNOSIS — E875 Hyperkalemia: Secondary | ICD-10-CM | POA: Diagnosis not present

## 2020-01-31 DIAGNOSIS — I9589 Other hypotension: Secondary | ICD-10-CM | POA: Diagnosis not present

## 2020-01-31 DIAGNOSIS — Z23 Encounter for immunization: Secondary | ICD-10-CM | POA: Diagnosis not present

## 2020-01-31 DIAGNOSIS — Z20822 Contact with and (suspected) exposure to covid-19: Secondary | ICD-10-CM | POA: Diagnosis not present

## 2020-01-31 DIAGNOSIS — E039 Hypothyroidism, unspecified: Secondary | ICD-10-CM | POA: Diagnosis not present

## 2020-01-31 DIAGNOSIS — D638 Anemia in other chronic diseases classified elsewhere: Secondary | ICD-10-CM | POA: Diagnosis not present

## 2020-01-31 DIAGNOSIS — G4700 Insomnia, unspecified: Secondary | ICD-10-CM | POA: Diagnosis not present

## 2020-01-31 DIAGNOSIS — D649 Anemia, unspecified: Secondary | ICD-10-CM | POA: Diagnosis not present

## 2020-01-31 DIAGNOSIS — R059 Cough, unspecified: Secondary | ICD-10-CM | POA: Diagnosis not present

## 2020-01-31 DIAGNOSIS — R531 Weakness: Secondary | ICD-10-CM | POA: Diagnosis not present

## 2020-01-31 DIAGNOSIS — D509 Iron deficiency anemia, unspecified: Secondary | ICD-10-CM | POA: Diagnosis not present

## 2020-01-31 NOTE — Progress Notes (Signed)
DISCHARGE NOTE SNF   [name] to be discharged [facility] facility per MD order. Patient verbalized understanding.  Skin clean, dry and intact without evidence of skin break down, no evidence of skin tears noted. IV catheter discontinued intact. Site without signs and symptoms of complications. Dressing and pressure applied. Pt denies pain at the site currently. No complaints noted.  Patient free of lines, drains, and wounds.   Discharge packet assembled. An After Visit Summary (AVS) was printed and given to the EMS personnel. Patient escorted via stretcher and discharged to [facility] via ambulance. Report called to accepting facility by [facility]; all questions and concerns addressed.   Babs Sciara, RN

## 2020-02-01 DIAGNOSIS — D631 Anemia in chronic kidney disease: Secondary | ICD-10-CM | POA: Diagnosis not present

## 2020-02-01 DIAGNOSIS — Z992 Dependence on renal dialysis: Secondary | ICD-10-CM | POA: Diagnosis not present

## 2020-02-01 DIAGNOSIS — N186 End stage renal disease: Secondary | ICD-10-CM | POA: Diagnosis not present

## 2020-02-01 DIAGNOSIS — D509 Iron deficiency anemia, unspecified: Secondary | ICD-10-CM | POA: Diagnosis not present

## 2020-02-01 DIAGNOSIS — D638 Anemia in other chronic diseases classified elsewhere: Secondary | ICD-10-CM | POA: Diagnosis not present

## 2020-02-01 DIAGNOSIS — R531 Weakness: Secondary | ICD-10-CM | POA: Diagnosis not present

## 2020-02-01 DIAGNOSIS — N2581 Secondary hyperparathyroidism of renal origin: Secondary | ICD-10-CM | POA: Diagnosis not present

## 2020-02-01 DIAGNOSIS — E039 Hypothyroidism, unspecified: Secondary | ICD-10-CM | POA: Diagnosis not present

## 2020-02-03 DIAGNOSIS — D509 Iron deficiency anemia, unspecified: Secondary | ICD-10-CM | POA: Diagnosis not present

## 2020-02-03 DIAGNOSIS — D631 Anemia in chronic kidney disease: Secondary | ICD-10-CM | POA: Diagnosis not present

## 2020-02-03 DIAGNOSIS — N2581 Secondary hyperparathyroidism of renal origin: Secondary | ICD-10-CM | POA: Diagnosis not present

## 2020-02-03 DIAGNOSIS — Z992 Dependence on renal dialysis: Secondary | ICD-10-CM | POA: Diagnosis not present

## 2020-02-03 DIAGNOSIS — N186 End stage renal disease: Secondary | ICD-10-CM | POA: Diagnosis not present

## 2020-02-05 DIAGNOSIS — N186 End stage renal disease: Secondary | ICD-10-CM | POA: Diagnosis not present

## 2020-02-05 DIAGNOSIS — Z992 Dependence on renal dialysis: Secondary | ICD-10-CM | POA: Diagnosis not present

## 2020-02-05 DIAGNOSIS — N2581 Secondary hyperparathyroidism of renal origin: Secondary | ICD-10-CM | POA: Diagnosis not present

## 2020-02-05 DIAGNOSIS — D509 Iron deficiency anemia, unspecified: Secondary | ICD-10-CM | POA: Diagnosis not present

## 2020-02-05 DIAGNOSIS — D631 Anemia in chronic kidney disease: Secondary | ICD-10-CM | POA: Diagnosis not present

## 2020-02-05 DIAGNOSIS — Z20822 Contact with and (suspected) exposure to covid-19: Secondary | ICD-10-CM | POA: Diagnosis not present

## 2020-02-07 DIAGNOSIS — Z992 Dependence on renal dialysis: Secondary | ICD-10-CM | POA: Diagnosis not present

## 2020-02-07 DIAGNOSIS — D509 Iron deficiency anemia, unspecified: Secondary | ICD-10-CM | POA: Diagnosis not present

## 2020-02-07 DIAGNOSIS — N186 End stage renal disease: Secondary | ICD-10-CM | POA: Diagnosis not present

## 2020-02-07 DIAGNOSIS — D631 Anemia in chronic kidney disease: Secondary | ICD-10-CM | POA: Diagnosis not present

## 2020-02-07 DIAGNOSIS — N2581 Secondary hyperparathyroidism of renal origin: Secondary | ICD-10-CM | POA: Diagnosis not present

## 2020-02-10 DIAGNOSIS — N186 End stage renal disease: Secondary | ICD-10-CM | POA: Diagnosis not present

## 2020-02-10 DIAGNOSIS — N2581 Secondary hyperparathyroidism of renal origin: Secondary | ICD-10-CM | POA: Diagnosis not present

## 2020-02-10 DIAGNOSIS — Z992 Dependence on renal dialysis: Secondary | ICD-10-CM | POA: Diagnosis not present

## 2020-02-10 DIAGNOSIS — D509 Iron deficiency anemia, unspecified: Secondary | ICD-10-CM | POA: Diagnosis not present

## 2020-02-10 DIAGNOSIS — D631 Anemia in chronic kidney disease: Secondary | ICD-10-CM | POA: Diagnosis not present

## 2020-02-13 DIAGNOSIS — Z992 Dependence on renal dialysis: Secondary | ICD-10-CM | POA: Diagnosis not present

## 2020-02-13 DIAGNOSIS — N2581 Secondary hyperparathyroidism of renal origin: Secondary | ICD-10-CM | POA: Diagnosis not present

## 2020-02-13 DIAGNOSIS — N186 End stage renal disease: Secondary | ICD-10-CM | POA: Diagnosis not present

## 2020-02-13 DIAGNOSIS — D631 Anemia in chronic kidney disease: Secondary | ICD-10-CM | POA: Diagnosis not present

## 2020-02-13 DIAGNOSIS — D509 Iron deficiency anemia, unspecified: Secondary | ICD-10-CM | POA: Diagnosis not present

## 2020-02-14 DIAGNOSIS — I739 Peripheral vascular disease, unspecified: Secondary | ICD-10-CM | POA: Diagnosis not present

## 2020-02-14 DIAGNOSIS — D631 Anemia in chronic kidney disease: Secondary | ICD-10-CM | POA: Diagnosis not present

## 2020-02-14 DIAGNOSIS — R059 Cough, unspecified: Secondary | ICD-10-CM | POA: Diagnosis not present

## 2020-02-14 DIAGNOSIS — N186 End stage renal disease: Secondary | ICD-10-CM | POA: Diagnosis not present

## 2020-02-14 DIAGNOSIS — N139 Obstructive and reflux uropathy, unspecified: Secondary | ICD-10-CM | POA: Diagnosis not present

## 2020-02-14 DIAGNOSIS — N2581 Secondary hyperparathyroidism of renal origin: Secondary | ICD-10-CM | POA: Diagnosis not present

## 2020-02-14 DIAGNOSIS — D649 Anemia, unspecified: Secondary | ICD-10-CM | POA: Diagnosis not present

## 2020-02-14 DIAGNOSIS — E875 Hyperkalemia: Secondary | ICD-10-CM | POA: Diagnosis not present

## 2020-02-14 DIAGNOSIS — Z992 Dependence on renal dialysis: Secondary | ICD-10-CM | POA: Diagnosis not present

## 2020-02-14 DIAGNOSIS — D638 Anemia in other chronic diseases classified elsewhere: Secondary | ICD-10-CM | POA: Diagnosis not present

## 2020-02-14 DIAGNOSIS — E039 Hypothyroidism, unspecified: Secondary | ICD-10-CM | POA: Diagnosis not present

## 2020-02-14 DIAGNOSIS — D509 Iron deficiency anemia, unspecified: Secondary | ICD-10-CM | POA: Diagnosis not present

## 2020-02-14 DIAGNOSIS — I9589 Other hypotension: Secondary | ICD-10-CM | POA: Diagnosis not present

## 2020-02-14 DIAGNOSIS — R531 Weakness: Secondary | ICD-10-CM | POA: Diagnosis not present

## 2020-02-15 DIAGNOSIS — E875 Hyperkalemia: Secondary | ICD-10-CM | POA: Diagnosis not present

## 2020-02-15 DIAGNOSIS — Z23 Encounter for immunization: Secondary | ICD-10-CM | POA: Diagnosis not present

## 2020-02-15 DIAGNOSIS — E039 Hypothyroidism, unspecified: Secondary | ICD-10-CM | POA: Diagnosis not present

## 2020-02-15 DIAGNOSIS — D638 Anemia in other chronic diseases classified elsewhere: Secondary | ICD-10-CM | POA: Diagnosis not present

## 2020-02-15 DIAGNOSIS — R531 Weakness: Secondary | ICD-10-CM | POA: Diagnosis not present

## 2020-02-15 DIAGNOSIS — R059 Cough, unspecified: Secondary | ICD-10-CM | POA: Diagnosis not present

## 2020-02-15 DIAGNOSIS — N186 End stage renal disease: Secondary | ICD-10-CM | POA: Diagnosis not present

## 2020-02-15 DIAGNOSIS — Z992 Dependence on renal dialysis: Secondary | ICD-10-CM | POA: Diagnosis not present

## 2020-02-15 DIAGNOSIS — N2581 Secondary hyperparathyroidism of renal origin: Secondary | ICD-10-CM | POA: Diagnosis not present

## 2020-02-17 DIAGNOSIS — E875 Hyperkalemia: Secondary | ICD-10-CM | POA: Diagnosis not present

## 2020-02-17 DIAGNOSIS — N2581 Secondary hyperparathyroidism of renal origin: Secondary | ICD-10-CM | POA: Diagnosis not present

## 2020-02-17 DIAGNOSIS — Z992 Dependence on renal dialysis: Secondary | ICD-10-CM | POA: Diagnosis not present

## 2020-02-17 DIAGNOSIS — N186 End stage renal disease: Secondary | ICD-10-CM | POA: Diagnosis not present

## 2020-02-17 DIAGNOSIS — Z23 Encounter for immunization: Secondary | ICD-10-CM | POA: Diagnosis not present

## 2020-02-20 DIAGNOSIS — N2581 Secondary hyperparathyroidism of renal origin: Secondary | ICD-10-CM | POA: Diagnosis not present

## 2020-02-20 DIAGNOSIS — N186 End stage renal disease: Secondary | ICD-10-CM | POA: Diagnosis not present

## 2020-02-20 DIAGNOSIS — Z992 Dependence on renal dialysis: Secondary | ICD-10-CM | POA: Diagnosis not present

## 2020-02-20 DIAGNOSIS — Z23 Encounter for immunization: Secondary | ICD-10-CM | POA: Diagnosis not present

## 2020-02-20 DIAGNOSIS — E875 Hyperkalemia: Secondary | ICD-10-CM | POA: Diagnosis not present

## 2020-02-22 DIAGNOSIS — N186 End stage renal disease: Secondary | ICD-10-CM | POA: Diagnosis not present

## 2020-02-22 DIAGNOSIS — Z992 Dependence on renal dialysis: Secondary | ICD-10-CM | POA: Diagnosis not present

## 2020-02-22 DIAGNOSIS — N2581 Secondary hyperparathyroidism of renal origin: Secondary | ICD-10-CM | POA: Diagnosis not present

## 2020-02-22 DIAGNOSIS — E875 Hyperkalemia: Secondary | ICD-10-CM | POA: Diagnosis not present

## 2020-02-22 DIAGNOSIS — Z23 Encounter for immunization: Secondary | ICD-10-CM | POA: Diagnosis not present

## 2020-02-24 DIAGNOSIS — N2581 Secondary hyperparathyroidism of renal origin: Secondary | ICD-10-CM | POA: Diagnosis not present

## 2020-02-24 DIAGNOSIS — N186 End stage renal disease: Secondary | ICD-10-CM | POA: Diagnosis not present

## 2020-02-24 DIAGNOSIS — Z992 Dependence on renal dialysis: Secondary | ICD-10-CM | POA: Diagnosis not present

## 2020-02-24 DIAGNOSIS — Z23 Encounter for immunization: Secondary | ICD-10-CM | POA: Diagnosis not present

## 2020-02-24 DIAGNOSIS — E875 Hyperkalemia: Secondary | ICD-10-CM | POA: Diagnosis not present

## 2020-02-27 DIAGNOSIS — N2581 Secondary hyperparathyroidism of renal origin: Secondary | ICD-10-CM | POA: Diagnosis not present

## 2020-02-27 DIAGNOSIS — N186 End stage renal disease: Secondary | ICD-10-CM | POA: Diagnosis not present

## 2020-02-27 DIAGNOSIS — E875 Hyperkalemia: Secondary | ICD-10-CM | POA: Diagnosis not present

## 2020-02-27 DIAGNOSIS — Z992 Dependence on renal dialysis: Secondary | ICD-10-CM | POA: Diagnosis not present

## 2020-02-27 DIAGNOSIS — Z23 Encounter for immunization: Secondary | ICD-10-CM | POA: Diagnosis not present

## 2020-02-29 DIAGNOSIS — N186 End stage renal disease: Secondary | ICD-10-CM | POA: Diagnosis not present

## 2020-02-29 DIAGNOSIS — Z23 Encounter for immunization: Secondary | ICD-10-CM | POA: Diagnosis not present

## 2020-02-29 DIAGNOSIS — Z992 Dependence on renal dialysis: Secondary | ICD-10-CM | POA: Diagnosis not present

## 2020-02-29 DIAGNOSIS — E875 Hyperkalemia: Secondary | ICD-10-CM | POA: Diagnosis not present

## 2020-02-29 DIAGNOSIS — N2581 Secondary hyperparathyroidism of renal origin: Secondary | ICD-10-CM | POA: Diagnosis not present

## 2020-03-02 DIAGNOSIS — N2581 Secondary hyperparathyroidism of renal origin: Secondary | ICD-10-CM | POA: Diagnosis not present

## 2020-03-02 DIAGNOSIS — Z23 Encounter for immunization: Secondary | ICD-10-CM | POA: Diagnosis not present

## 2020-03-02 DIAGNOSIS — E875 Hyperkalemia: Secondary | ICD-10-CM | POA: Diagnosis not present

## 2020-03-02 DIAGNOSIS — N186 End stage renal disease: Secondary | ICD-10-CM | POA: Diagnosis not present

## 2020-03-02 DIAGNOSIS — Z992 Dependence on renal dialysis: Secondary | ICD-10-CM | POA: Diagnosis not present

## 2020-03-05 DIAGNOSIS — N186 End stage renal disease: Secondary | ICD-10-CM | POA: Diagnosis not present

## 2020-03-05 DIAGNOSIS — N2581 Secondary hyperparathyroidism of renal origin: Secondary | ICD-10-CM | POA: Diagnosis not present

## 2020-03-05 DIAGNOSIS — Z992 Dependence on renal dialysis: Secondary | ICD-10-CM | POA: Diagnosis not present

## 2020-03-05 DIAGNOSIS — Z23 Encounter for immunization: Secondary | ICD-10-CM | POA: Diagnosis not present

## 2020-03-05 DIAGNOSIS — E875 Hyperkalemia: Secondary | ICD-10-CM | POA: Diagnosis not present

## 2020-03-07 DIAGNOSIS — E875 Hyperkalemia: Secondary | ICD-10-CM | POA: Diagnosis not present

## 2020-03-07 DIAGNOSIS — N2581 Secondary hyperparathyroidism of renal origin: Secondary | ICD-10-CM | POA: Diagnosis not present

## 2020-03-07 DIAGNOSIS — Z992 Dependence on renal dialysis: Secondary | ICD-10-CM | POA: Diagnosis not present

## 2020-03-07 DIAGNOSIS — N186 End stage renal disease: Secondary | ICD-10-CM | POA: Diagnosis not present

## 2020-03-07 DIAGNOSIS — Z23 Encounter for immunization: Secondary | ICD-10-CM | POA: Diagnosis not present

## 2020-03-10 DIAGNOSIS — N186 End stage renal disease: Secondary | ICD-10-CM | POA: Diagnosis not present

## 2020-03-10 DIAGNOSIS — E875 Hyperkalemia: Secondary | ICD-10-CM | POA: Diagnosis not present

## 2020-03-10 DIAGNOSIS — N2581 Secondary hyperparathyroidism of renal origin: Secondary | ICD-10-CM | POA: Diagnosis not present

## 2020-03-10 DIAGNOSIS — Z23 Encounter for immunization: Secondary | ICD-10-CM | POA: Diagnosis not present

## 2020-03-10 DIAGNOSIS — Z992 Dependence on renal dialysis: Secondary | ICD-10-CM | POA: Diagnosis not present

## 2020-03-12 ENCOUNTER — Telehealth: Payer: Self-pay

## 2020-03-12 DIAGNOSIS — Z23 Encounter for immunization: Secondary | ICD-10-CM | POA: Diagnosis not present

## 2020-03-12 DIAGNOSIS — Z992 Dependence on renal dialysis: Secondary | ICD-10-CM | POA: Diagnosis not present

## 2020-03-12 DIAGNOSIS — E875 Hyperkalemia: Secondary | ICD-10-CM | POA: Diagnosis not present

## 2020-03-12 DIAGNOSIS — N2581 Secondary hyperparathyroidism of renal origin: Secondary | ICD-10-CM | POA: Diagnosis not present

## 2020-03-12 DIAGNOSIS — N186 End stage renal disease: Secondary | ICD-10-CM | POA: Diagnosis not present

## 2020-03-12 NOTE — Telephone Encounter (Signed)
Request from [facility] for new fistula access placement. Numerous unsuccessful attempts made to reach patient to schedule right arm fistula vs graft placement with Dr. Donzetta Matters. Contacted kidney center, who advised pt now resides at [facility]. Call placed- unable to reach [employer] after continuous rings. UTR letter mailed.

## 2020-03-14 DIAGNOSIS — Z992 Dependence on renal dialysis: Secondary | ICD-10-CM | POA: Diagnosis not present

## 2020-03-14 DIAGNOSIS — E875 Hyperkalemia: Secondary | ICD-10-CM | POA: Diagnosis not present

## 2020-03-14 DIAGNOSIS — Z23 Encounter for immunization: Secondary | ICD-10-CM | POA: Diagnosis not present

## 2020-03-14 DIAGNOSIS — N2581 Secondary hyperparathyroidism of renal origin: Secondary | ICD-10-CM | POA: Diagnosis not present

## 2020-03-14 DIAGNOSIS — N186 End stage renal disease: Secondary | ICD-10-CM | POA: Diagnosis not present

## 2020-03-16 DIAGNOSIS — N186 End stage renal disease: Secondary | ICD-10-CM | POA: Diagnosis not present

## 2020-03-16 DIAGNOSIS — Z992 Dependence on renal dialysis: Secondary | ICD-10-CM | POA: Diagnosis not present

## 2020-03-16 DIAGNOSIS — N139 Obstructive and reflux uropathy, unspecified: Secondary | ICD-10-CM | POA: Diagnosis not present

## 2020-03-17 DIAGNOSIS — N2581 Secondary hyperparathyroidism of renal origin: Secondary | ICD-10-CM | POA: Diagnosis not present

## 2020-03-17 DIAGNOSIS — N186 End stage renal disease: Secondary | ICD-10-CM | POA: Diagnosis not present

## 2020-03-17 DIAGNOSIS — Z992 Dependence on renal dialysis: Secondary | ICD-10-CM | POA: Diagnosis not present

## 2020-03-17 DIAGNOSIS — D509 Iron deficiency anemia, unspecified: Secondary | ICD-10-CM | POA: Diagnosis not present

## 2020-03-17 DIAGNOSIS — E875 Hyperkalemia: Secondary | ICD-10-CM | POA: Diagnosis not present

## 2020-03-19 DIAGNOSIS — E875 Hyperkalemia: Secondary | ICD-10-CM | POA: Diagnosis not present

## 2020-03-19 DIAGNOSIS — D509 Iron deficiency anemia, unspecified: Secondary | ICD-10-CM | POA: Diagnosis not present

## 2020-03-19 DIAGNOSIS — N2581 Secondary hyperparathyroidism of renal origin: Secondary | ICD-10-CM | POA: Diagnosis not present

## 2020-03-19 DIAGNOSIS — Z992 Dependence on renal dialysis: Secondary | ICD-10-CM | POA: Diagnosis not present

## 2020-03-19 DIAGNOSIS — N186 End stage renal disease: Secondary | ICD-10-CM | POA: Diagnosis not present

## 2020-03-21 ENCOUNTER — Other Ambulatory Visit: Payer: Self-pay | Admitting: *Deleted

## 2020-03-21 ENCOUNTER — Encounter: Payer: Self-pay | Admitting: *Deleted

## 2020-03-21 DIAGNOSIS — N2581 Secondary hyperparathyroidism of renal origin: Secondary | ICD-10-CM | POA: Diagnosis not present

## 2020-03-21 DIAGNOSIS — Z992 Dependence on renal dialysis: Secondary | ICD-10-CM | POA: Diagnosis not present

## 2020-03-21 DIAGNOSIS — D509 Iron deficiency anemia, unspecified: Secondary | ICD-10-CM | POA: Diagnosis not present

## 2020-03-21 DIAGNOSIS — N186 End stage renal disease: Secondary | ICD-10-CM | POA: Diagnosis not present

## 2020-03-21 DIAGNOSIS — E875 Hyperkalemia: Secondary | ICD-10-CM | POA: Diagnosis not present

## 2020-03-23 DIAGNOSIS — E875 Hyperkalemia: Secondary | ICD-10-CM | POA: Diagnosis not present

## 2020-03-23 DIAGNOSIS — D509 Iron deficiency anemia, unspecified: Secondary | ICD-10-CM | POA: Diagnosis not present

## 2020-03-23 DIAGNOSIS — Z992 Dependence on renal dialysis: Secondary | ICD-10-CM | POA: Diagnosis not present

## 2020-03-23 DIAGNOSIS — N2581 Secondary hyperparathyroidism of renal origin: Secondary | ICD-10-CM | POA: Diagnosis not present

## 2020-03-23 DIAGNOSIS — N186 End stage renal disease: Secondary | ICD-10-CM | POA: Diagnosis not present

## 2020-03-26 ENCOUNTER — Encounter (HOSPITAL_COMMUNITY): Payer: Self-pay | Admitting: Anesthesiology

## 2020-03-26 NOTE — Pre-Procedure Instructions (Signed)
    JERRIE SCHUSSLER  03/26/2020      Ms. Yates  procedure is scheduled on  03/27/20  Report to [facility], Main Entrance or Entrance "A" at 10:30 AM                >>>>>Please send patient's Medication Record with medications administrated documentation. ( this information is required prior to OR. This includes medications that may have been on hold for surgery)<<<<<   Call this number if you have problems the morning of surgery: [phone]  This is the number for the Pre- Surgical Desk.                For any other questions, please call [phone], Monday - Friday 8 AM - 4 PM.   Remember:  Do not eat or drink after midnight.   Take these medicines the morning of surgery with A SIP OF WATER : gabapentin (NEURONTIN)  loratadine (CLARITIN) sertraline (ZOLOFT)   May take if needed: acetaminophen (TYLENOL)  PROVENTIL HFA 108 (90 Base) MCG/ACT inhaler  The morning of surgery:      -  should have a morning shower or a  bed bath with antibacteria soap       -  dry off with a clean towel      - wear clean comfortable clothes      - do not apply any lotions, powders, colognes, or deodorant      -  brush teeth      - do not wear any jewelry or piercings  Patient will not be apply to wear glasses, contact lens, hearing aids or dentures or partials, to surgery.  Do not bring any valuables to the hospital with you, [facility] is not responsible for any loss or damage.

## 2020-03-26 NOTE — Progress Notes (Signed)
Mary [facility] is a patient at [facility].  I called and spoke to [facility] nurse, Renita

## 2020-03-27 ENCOUNTER — Ambulatory Visit (HOSPITAL_COMMUNITY): Admission: RE | Admit: 2020-03-27 | Payer: Medicare Other | Source: Home / Self Care | Admitting: Vascular Surgery

## 2020-03-27 SURGERY — ARTERIOVENOUS (AV) FISTULA CREATION
Anesthesia: Choice | Laterality: Right

## 2020-03-28 DIAGNOSIS — E875 Hyperkalemia: Secondary | ICD-10-CM | POA: Diagnosis not present

## 2020-03-28 DIAGNOSIS — Z992 Dependence on renal dialysis: Secondary | ICD-10-CM | POA: Diagnosis not present

## 2020-03-28 DIAGNOSIS — D638 Anemia in other chronic diseases classified elsewhere: Secondary | ICD-10-CM | POA: Diagnosis not present

## 2020-03-28 DIAGNOSIS — D509 Iron deficiency anemia, unspecified: Secondary | ICD-10-CM | POA: Diagnosis not present

## 2020-03-28 DIAGNOSIS — N2581 Secondary hyperparathyroidism of renal origin: Secondary | ICD-10-CM | POA: Diagnosis not present

## 2020-03-28 DIAGNOSIS — N186 End stage renal disease: Secondary | ICD-10-CM | POA: Diagnosis not present

## 2020-03-28 DIAGNOSIS — E039 Hypothyroidism, unspecified: Secondary | ICD-10-CM | POA: Diagnosis not present

## 2020-03-30 DIAGNOSIS — N186 End stage renal disease: Secondary | ICD-10-CM | POA: Diagnosis not present

## 2020-03-30 DIAGNOSIS — E875 Hyperkalemia: Secondary | ICD-10-CM | POA: Diagnosis not present

## 2020-03-30 DIAGNOSIS — D509 Iron deficiency anemia, unspecified: Secondary | ICD-10-CM | POA: Diagnosis not present

## 2020-03-30 DIAGNOSIS — Z992 Dependence on renal dialysis: Secondary | ICD-10-CM | POA: Diagnosis not present

## 2020-03-30 DIAGNOSIS — N2581 Secondary hyperparathyroidism of renal origin: Secondary | ICD-10-CM | POA: Diagnosis not present

## 2020-04-02 DIAGNOSIS — N186 End stage renal disease: Secondary | ICD-10-CM | POA: Diagnosis not present

## 2020-04-02 DIAGNOSIS — D509 Iron deficiency anemia, unspecified: Secondary | ICD-10-CM | POA: Diagnosis not present

## 2020-04-02 DIAGNOSIS — E875 Hyperkalemia: Secondary | ICD-10-CM | POA: Diagnosis not present

## 2020-04-02 DIAGNOSIS — Z992 Dependence on renal dialysis: Secondary | ICD-10-CM | POA: Diagnosis not present

## 2020-04-02 DIAGNOSIS — N2581 Secondary hyperparathyroidism of renal origin: Secondary | ICD-10-CM | POA: Diagnosis not present

## 2020-04-04 DIAGNOSIS — Z992 Dependence on renal dialysis: Secondary | ICD-10-CM | POA: Diagnosis not present

## 2020-04-04 DIAGNOSIS — N186 End stage renal disease: Secondary | ICD-10-CM | POA: Diagnosis not present

## 2020-04-04 DIAGNOSIS — N2581 Secondary hyperparathyroidism of renal origin: Secondary | ICD-10-CM | POA: Diagnosis not present

## 2020-04-04 DIAGNOSIS — E875 Hyperkalemia: Secondary | ICD-10-CM | POA: Diagnosis not present

## 2020-04-04 DIAGNOSIS — D509 Iron deficiency anemia, unspecified: Secondary | ICD-10-CM | POA: Diagnosis not present

## 2020-04-06 DIAGNOSIS — D509 Iron deficiency anemia, unspecified: Secondary | ICD-10-CM | POA: Diagnosis not present

## 2020-04-06 DIAGNOSIS — N186 End stage renal disease: Secondary | ICD-10-CM | POA: Diagnosis not present

## 2020-04-06 DIAGNOSIS — E875 Hyperkalemia: Secondary | ICD-10-CM | POA: Diagnosis not present

## 2020-04-06 DIAGNOSIS — Z992 Dependence on renal dialysis: Secondary | ICD-10-CM | POA: Diagnosis not present

## 2020-04-06 DIAGNOSIS — N2581 Secondary hyperparathyroidism of renal origin: Secondary | ICD-10-CM | POA: Diagnosis not present

## 2020-04-09 DIAGNOSIS — N186 End stage renal disease: Secondary | ICD-10-CM | POA: Diagnosis not present

## 2020-04-09 DIAGNOSIS — E875 Hyperkalemia: Secondary | ICD-10-CM | POA: Diagnosis not present

## 2020-04-09 DIAGNOSIS — Z992 Dependence on renal dialysis: Secondary | ICD-10-CM | POA: Diagnosis not present

## 2020-04-09 DIAGNOSIS — N2581 Secondary hyperparathyroidism of renal origin: Secondary | ICD-10-CM | POA: Diagnosis not present

## 2020-04-09 DIAGNOSIS — D509 Iron deficiency anemia, unspecified: Secondary | ICD-10-CM | POA: Diagnosis not present

## 2020-04-11 DIAGNOSIS — N186 End stage renal disease: Secondary | ICD-10-CM | POA: Diagnosis not present

## 2020-04-11 DIAGNOSIS — D509 Iron deficiency anemia, unspecified: Secondary | ICD-10-CM | POA: Diagnosis not present

## 2020-04-11 DIAGNOSIS — Z992 Dependence on renal dialysis: Secondary | ICD-10-CM | POA: Diagnosis not present

## 2020-04-11 DIAGNOSIS — N2581 Secondary hyperparathyroidism of renal origin: Secondary | ICD-10-CM | POA: Diagnosis not present

## 2020-04-11 DIAGNOSIS — E875 Hyperkalemia: Secondary | ICD-10-CM | POA: Diagnosis not present

## 2020-04-13 DIAGNOSIS — N186 End stage renal disease: Secondary | ICD-10-CM | POA: Diagnosis not present

## 2020-04-13 DIAGNOSIS — D509 Iron deficiency anemia, unspecified: Secondary | ICD-10-CM | POA: Diagnosis not present

## 2020-04-13 DIAGNOSIS — N2581 Secondary hyperparathyroidism of renal origin: Secondary | ICD-10-CM | POA: Diagnosis not present

## 2020-04-13 DIAGNOSIS — Z992 Dependence on renal dialysis: Secondary | ICD-10-CM | POA: Diagnosis not present

## 2020-04-13 DIAGNOSIS — E875 Hyperkalemia: Secondary | ICD-10-CM | POA: Diagnosis not present

## 2020-04-16 DIAGNOSIS — E039 Hypothyroidism, unspecified: Secondary | ICD-10-CM | POA: Diagnosis not present

## 2020-04-16 DIAGNOSIS — D631 Anemia in chronic kidney disease: Secondary | ICD-10-CM | POA: Diagnosis not present

## 2020-04-16 DIAGNOSIS — I9589 Other hypotension: Secondary | ICD-10-CM | POA: Diagnosis not present

## 2020-04-16 DIAGNOSIS — E875 Hyperkalemia: Secondary | ICD-10-CM | POA: Diagnosis not present

## 2020-04-16 DIAGNOSIS — N139 Obstructive and reflux uropathy, unspecified: Secondary | ICD-10-CM | POA: Diagnosis not present

## 2020-04-16 DIAGNOSIS — N186 End stage renal disease: Secondary | ICD-10-CM | POA: Diagnosis not present

## 2020-04-16 DIAGNOSIS — I739 Peripheral vascular disease, unspecified: Secondary | ICD-10-CM | POA: Diagnosis not present

## 2020-04-16 DIAGNOSIS — D638 Anemia in other chronic diseases classified elsewhere: Secondary | ICD-10-CM | POA: Diagnosis not present

## 2020-04-16 DIAGNOSIS — D509 Iron deficiency anemia, unspecified: Secondary | ICD-10-CM | POA: Diagnosis not present

## 2020-04-16 DIAGNOSIS — N2581 Secondary hyperparathyroidism of renal origin: Secondary | ICD-10-CM | POA: Diagnosis not present

## 2020-04-16 DIAGNOSIS — Z992 Dependence on renal dialysis: Secondary | ICD-10-CM | POA: Diagnosis not present

## 2020-04-16 DIAGNOSIS — G4700 Insomnia, unspecified: Secondary | ICD-10-CM | POA: Diagnosis not present

## 2020-04-16 DIAGNOSIS — D649 Anemia, unspecified: Secondary | ICD-10-CM | POA: Diagnosis not present

## 2020-04-18 DIAGNOSIS — Z992 Dependence on renal dialysis: Secondary | ICD-10-CM | POA: Diagnosis not present

## 2020-04-18 DIAGNOSIS — N186 End stage renal disease: Secondary | ICD-10-CM | POA: Diagnosis not present

## 2020-04-18 DIAGNOSIS — D509 Iron deficiency anemia, unspecified: Secondary | ICD-10-CM | POA: Diagnosis not present

## 2020-04-18 DIAGNOSIS — E875 Hyperkalemia: Secondary | ICD-10-CM | POA: Diagnosis not present

## 2020-04-18 DIAGNOSIS — N2581 Secondary hyperparathyroidism of renal origin: Secondary | ICD-10-CM | POA: Diagnosis not present

## 2020-04-20 DIAGNOSIS — N186 End stage renal disease: Secondary | ICD-10-CM | POA: Diagnosis not present

## 2020-04-20 DIAGNOSIS — Z992 Dependence on renal dialysis: Secondary | ICD-10-CM | POA: Diagnosis not present

## 2020-04-20 DIAGNOSIS — D509 Iron deficiency anemia, unspecified: Secondary | ICD-10-CM | POA: Diagnosis not present

## 2020-04-20 DIAGNOSIS — E875 Hyperkalemia: Secondary | ICD-10-CM | POA: Diagnosis not present

## 2020-04-20 DIAGNOSIS — N2581 Secondary hyperparathyroidism of renal origin: Secondary | ICD-10-CM | POA: Diagnosis not present

## 2020-04-23 DIAGNOSIS — N2581 Secondary hyperparathyroidism of renal origin: Secondary | ICD-10-CM | POA: Diagnosis not present

## 2020-04-23 DIAGNOSIS — Z992 Dependence on renal dialysis: Secondary | ICD-10-CM | POA: Diagnosis not present

## 2020-04-23 DIAGNOSIS — N186 End stage renal disease: Secondary | ICD-10-CM | POA: Diagnosis not present

## 2020-04-23 DIAGNOSIS — D509 Iron deficiency anemia, unspecified: Secondary | ICD-10-CM | POA: Diagnosis not present

## 2020-04-23 DIAGNOSIS — E875 Hyperkalemia: Secondary | ICD-10-CM | POA: Diagnosis not present

## 2020-04-25 DIAGNOSIS — D638 Anemia in other chronic diseases classified elsewhere: Secondary | ICD-10-CM | POA: Diagnosis not present

## 2020-04-25 DIAGNOSIS — N2581 Secondary hyperparathyroidism of renal origin: Secondary | ICD-10-CM | POA: Diagnosis not present

## 2020-04-25 DIAGNOSIS — E875 Hyperkalemia: Secondary | ICD-10-CM | POA: Diagnosis not present

## 2020-04-25 DIAGNOSIS — G4700 Insomnia, unspecified: Secondary | ICD-10-CM | POA: Diagnosis not present

## 2020-04-25 DIAGNOSIS — Z992 Dependence on renal dialysis: Secondary | ICD-10-CM | POA: Diagnosis not present

## 2020-04-25 DIAGNOSIS — E039 Hypothyroidism, unspecified: Secondary | ICD-10-CM | POA: Diagnosis not present

## 2020-04-25 DIAGNOSIS — D509 Iron deficiency anemia, unspecified: Secondary | ICD-10-CM | POA: Diagnosis not present

## 2020-04-25 DIAGNOSIS — N186 End stage renal disease: Secondary | ICD-10-CM | POA: Diagnosis not present

## 2020-04-27 DIAGNOSIS — D509 Iron deficiency anemia, unspecified: Secondary | ICD-10-CM | POA: Diagnosis not present

## 2020-04-27 DIAGNOSIS — Z992 Dependence on renal dialysis: Secondary | ICD-10-CM | POA: Diagnosis not present

## 2020-04-27 DIAGNOSIS — N2581 Secondary hyperparathyroidism of renal origin: Secondary | ICD-10-CM | POA: Diagnosis not present

## 2020-04-27 DIAGNOSIS — N186 End stage renal disease: Secondary | ICD-10-CM | POA: Diagnosis not present

## 2020-04-27 DIAGNOSIS — E875 Hyperkalemia: Secondary | ICD-10-CM | POA: Diagnosis not present

## 2020-04-30 DIAGNOSIS — Z992 Dependence on renal dialysis: Secondary | ICD-10-CM | POA: Diagnosis not present

## 2020-04-30 DIAGNOSIS — N2581 Secondary hyperparathyroidism of renal origin: Secondary | ICD-10-CM | POA: Diagnosis not present

## 2020-04-30 DIAGNOSIS — D509 Iron deficiency anemia, unspecified: Secondary | ICD-10-CM | POA: Diagnosis not present

## 2020-04-30 DIAGNOSIS — N186 End stage renal disease: Secondary | ICD-10-CM | POA: Diagnosis not present

## 2020-04-30 DIAGNOSIS — E875 Hyperkalemia: Secondary | ICD-10-CM | POA: Diagnosis not present

## 2020-05-02 DIAGNOSIS — E875 Hyperkalemia: Secondary | ICD-10-CM | POA: Diagnosis not present

## 2020-05-02 DIAGNOSIS — N186 End stage renal disease: Secondary | ICD-10-CM | POA: Diagnosis not present

## 2020-05-02 DIAGNOSIS — D509 Iron deficiency anemia, unspecified: Secondary | ICD-10-CM | POA: Diagnosis not present

## 2020-05-02 DIAGNOSIS — Z992 Dependence on renal dialysis: Secondary | ICD-10-CM | POA: Diagnosis not present

## 2020-05-02 DIAGNOSIS — N2581 Secondary hyperparathyroidism of renal origin: Secondary | ICD-10-CM | POA: Diagnosis not present

## 2020-05-04 DIAGNOSIS — E875 Hyperkalemia: Secondary | ICD-10-CM | POA: Diagnosis not present

## 2020-05-04 DIAGNOSIS — N2581 Secondary hyperparathyroidism of renal origin: Secondary | ICD-10-CM | POA: Diagnosis not present

## 2020-05-04 DIAGNOSIS — N186 End stage renal disease: Secondary | ICD-10-CM | POA: Diagnosis not present

## 2020-05-04 DIAGNOSIS — D509 Iron deficiency anemia, unspecified: Secondary | ICD-10-CM | POA: Diagnosis not present

## 2020-05-04 DIAGNOSIS — Z992 Dependence on renal dialysis: Secondary | ICD-10-CM | POA: Diagnosis not present

## 2020-05-07 DIAGNOSIS — Z992 Dependence on renal dialysis: Secondary | ICD-10-CM | POA: Diagnosis not present

## 2020-05-07 DIAGNOSIS — N186 End stage renal disease: Secondary | ICD-10-CM | POA: Diagnosis not present

## 2020-05-07 DIAGNOSIS — E875 Hyperkalemia: Secondary | ICD-10-CM | POA: Diagnosis not present

## 2020-05-07 DIAGNOSIS — N2581 Secondary hyperparathyroidism of renal origin: Secondary | ICD-10-CM | POA: Diagnosis not present

## 2020-05-07 DIAGNOSIS — D509 Iron deficiency anemia, unspecified: Secondary | ICD-10-CM | POA: Diagnosis not present

## 2020-05-09 DIAGNOSIS — E875 Hyperkalemia: Secondary | ICD-10-CM | POA: Diagnosis not present

## 2020-05-09 DIAGNOSIS — N186 End stage renal disease: Secondary | ICD-10-CM | POA: Diagnosis not present

## 2020-05-09 DIAGNOSIS — Z992 Dependence on renal dialysis: Secondary | ICD-10-CM | POA: Diagnosis not present

## 2020-05-09 DIAGNOSIS — N2581 Secondary hyperparathyroidism of renal origin: Secondary | ICD-10-CM | POA: Diagnosis not present

## 2020-05-09 DIAGNOSIS — D509 Iron deficiency anemia, unspecified: Secondary | ICD-10-CM | POA: Diagnosis not present

## 2020-05-11 DIAGNOSIS — D509 Iron deficiency anemia, unspecified: Secondary | ICD-10-CM | POA: Diagnosis not present

## 2020-05-11 DIAGNOSIS — N186 End stage renal disease: Secondary | ICD-10-CM | POA: Diagnosis not present

## 2020-05-11 DIAGNOSIS — N2581 Secondary hyperparathyroidism of renal origin: Secondary | ICD-10-CM | POA: Diagnosis not present

## 2020-05-11 DIAGNOSIS — E875 Hyperkalemia: Secondary | ICD-10-CM | POA: Diagnosis not present

## 2020-05-11 DIAGNOSIS — Z992 Dependence on renal dialysis: Secondary | ICD-10-CM | POA: Diagnosis not present

## 2020-05-14 DIAGNOSIS — M6281 Muscle weakness (generalized): Secondary | ICD-10-CM | POA: Diagnosis not present

## 2020-05-14 DIAGNOSIS — Z992 Dependence on renal dialysis: Secondary | ICD-10-CM | POA: Diagnosis not present

## 2020-05-14 DIAGNOSIS — N2581 Secondary hyperparathyroidism of renal origin: Secondary | ICD-10-CM | POA: Diagnosis not present

## 2020-05-14 DIAGNOSIS — R2689 Other abnormalities of gait and mobility: Secondary | ICD-10-CM | POA: Diagnosis not present

## 2020-05-14 DIAGNOSIS — N186 End stage renal disease: Secondary | ICD-10-CM | POA: Diagnosis not present

## 2020-05-14 DIAGNOSIS — R2681 Unsteadiness on feet: Secondary | ICD-10-CM | POA: Diagnosis not present

## 2020-05-14 DIAGNOSIS — D509 Iron deficiency anemia, unspecified: Secondary | ICD-10-CM | POA: Diagnosis not present

## 2020-05-15 DIAGNOSIS — N186 End stage renal disease: Secondary | ICD-10-CM | POA: Diagnosis not present

## 2020-05-15 DIAGNOSIS — R2689 Other abnormalities of gait and mobility: Secondary | ICD-10-CM | POA: Diagnosis not present

## 2020-05-15 DIAGNOSIS — R2681 Unsteadiness on feet: Secondary | ICD-10-CM | POA: Diagnosis not present

## 2020-05-15 DIAGNOSIS — M6281 Muscle weakness (generalized): Secondary | ICD-10-CM | POA: Diagnosis not present

## 2020-05-16 DIAGNOSIS — R2681 Unsteadiness on feet: Secondary | ICD-10-CM | POA: Diagnosis not present

## 2020-05-16 DIAGNOSIS — M6281 Muscle weakness (generalized): Secondary | ICD-10-CM | POA: Diagnosis not present

## 2020-05-16 DIAGNOSIS — N186 End stage renal disease: Secondary | ICD-10-CM | POA: Diagnosis not present

## 2020-05-16 DIAGNOSIS — R2689 Other abnormalities of gait and mobility: Secondary | ICD-10-CM | POA: Diagnosis not present

## 2020-05-18 ENCOUNTER — Emergency Department (HOSPITAL_COMMUNITY): Payer: Medicare Other

## 2020-05-18 ENCOUNTER — Other Ambulatory Visit: Payer: Self-pay

## 2020-05-18 ENCOUNTER — Inpatient Hospital Stay (HOSPITAL_COMMUNITY): Payer: Medicare Other

## 2020-05-18 ENCOUNTER — Inpatient Hospital Stay (HOSPITAL_COMMUNITY)
Admission: EM | Admit: 2020-05-18 | Discharge: 2020-06-14 | DRG: 870 | Disposition: E | Payer: Medicare Other | Attending: Pulmonary Disease | Admitting: Pulmonary Disease

## 2020-05-18 ENCOUNTER — Other Ambulatory Visit (HOSPITAL_COMMUNITY): Payer: Self-pay

## 2020-05-18 DIAGNOSIS — N186 End stage renal disease: Secondary | ICD-10-CM

## 2020-05-18 DIAGNOSIS — Z79899 Other long term (current) drug therapy: Secondary | ICD-10-CM

## 2020-05-18 DIAGNOSIS — R569 Unspecified convulsions: Secondary | ICD-10-CM | POA: Diagnosis not present

## 2020-05-18 DIAGNOSIS — I953 Hypotension of hemodialysis: Secondary | ICD-10-CM | POA: Diagnosis present

## 2020-05-18 DIAGNOSIS — J9601 Acute respiratory failure with hypoxia: Secondary | ICD-10-CM | POA: Diagnosis present

## 2020-05-18 DIAGNOSIS — Z0189 Encounter for other specified special examinations: Secondary | ICD-10-CM

## 2020-05-18 DIAGNOSIS — I161 Hypertensive emergency: Secondary | ICD-10-CM

## 2020-05-18 DIAGNOSIS — D631 Anemia in chronic kidney disease: Secondary | ICD-10-CM | POA: Diagnosis present

## 2020-05-18 DIAGNOSIS — Z7189 Other specified counseling: Secondary | ICD-10-CM | POA: Diagnosis not present

## 2020-05-18 DIAGNOSIS — Z4682 Encounter for fitting and adjustment of non-vascular catheter: Secondary | ICD-10-CM | POA: Diagnosis not present

## 2020-05-18 DIAGNOSIS — K7200 Acute and subacute hepatic failure without coma: Secondary | ICD-10-CM | POA: Diagnosis present

## 2020-05-18 DIAGNOSIS — N823 Fistula of vagina to large intestine: Secondary | ICD-10-CM | POA: Diagnosis present

## 2020-05-18 DIAGNOSIS — Z992 Dependence on renal dialysis: Secondary | ICD-10-CM

## 2020-05-18 DIAGNOSIS — R402431 Glasgow coma scale score 3-8, in the field [EMT or ambulance]: Secondary | ICD-10-CM | POA: Diagnosis not present

## 2020-05-18 DIAGNOSIS — R0603 Acute respiratory distress: Secondary | ICD-10-CM

## 2020-05-18 DIAGNOSIS — M4802 Spinal stenosis, cervical region: Secondary | ICD-10-CM | POA: Diagnosis not present

## 2020-05-18 DIAGNOSIS — E8779 Other fluid overload: Secondary | ICD-10-CM | POA: Diagnosis not present

## 2020-05-18 DIAGNOSIS — Z515 Encounter for palliative care: Secondary | ICD-10-CM

## 2020-05-18 DIAGNOSIS — I469 Cardiac arrest, cause unspecified: Secondary | ICD-10-CM | POA: Diagnosis not present

## 2020-05-18 DIAGNOSIS — I739 Peripheral vascular disease, unspecified: Secondary | ICD-10-CM | POA: Diagnosis present

## 2020-05-18 DIAGNOSIS — Z8249 Family history of ischemic heart disease and other diseases of the circulatory system: Secondary | ICD-10-CM

## 2020-05-18 DIAGNOSIS — G9340 Encephalopathy, unspecified: Secondary | ICD-10-CM | POA: Diagnosis not present

## 2020-05-18 DIAGNOSIS — R739 Hyperglycemia, unspecified: Secondary | ICD-10-CM | POA: Diagnosis not present

## 2020-05-18 DIAGNOSIS — E875 Hyperkalemia: Secondary | ICD-10-CM

## 2020-05-18 DIAGNOSIS — M50222 Other cervical disc displacement at C5-C6 level: Secondary | ICD-10-CM | POA: Diagnosis not present

## 2020-05-18 DIAGNOSIS — I6783 Posterior reversible encephalopathy syndrome: Secondary | ICD-10-CM

## 2020-05-18 DIAGNOSIS — J9811 Atelectasis: Secondary | ICD-10-CM | POA: Diagnosis not present

## 2020-05-18 DIAGNOSIS — E871 Hypo-osmolality and hyponatremia: Secondary | ICD-10-CM | POA: Diagnosis present

## 2020-05-18 DIAGNOSIS — Z83438 Family history of other disorder of lipoprotein metabolism and other lipidemia: Secondary | ICD-10-CM

## 2020-05-18 DIAGNOSIS — R4182 Altered mental status, unspecified: Secondary | ICD-10-CM | POA: Diagnosis not present

## 2020-05-18 DIAGNOSIS — Z9115 Patient's noncompliance with renal dialysis: Secondary | ICD-10-CM | POA: Diagnosis not present

## 2020-05-18 DIAGNOSIS — E039 Hypothyroidism, unspecified: Secondary | ICD-10-CM | POA: Diagnosis present

## 2020-05-18 DIAGNOSIS — R404 Transient alteration of awareness: Secondary | ICD-10-CM | POA: Diagnosis not present

## 2020-05-18 DIAGNOSIS — I9589 Other hypotension: Secondary | ICD-10-CM | POA: Diagnosis present

## 2020-05-18 DIAGNOSIS — R400 Somnolence: Secondary | ICD-10-CM | POA: Diagnosis not present

## 2020-05-18 DIAGNOSIS — I428 Other cardiomyopathies: Secondary | ICD-10-CM | POA: Diagnosis not present

## 2020-05-18 DIAGNOSIS — I120 Hypertensive chronic kidney disease with stage 5 chronic kidney disease or end stage renal disease: Secondary | ICD-10-CM | POA: Diagnosis present

## 2020-05-18 DIAGNOSIS — J189 Pneumonia, unspecified organism: Secondary | ICD-10-CM | POA: Diagnosis present

## 2020-05-18 DIAGNOSIS — Z833 Family history of diabetes mellitus: Secondary | ICD-10-CM

## 2020-05-18 DIAGNOSIS — N2581 Secondary hyperparathyroidism of renal origin: Secondary | ICD-10-CM | POA: Diagnosis present

## 2020-05-18 DIAGNOSIS — Z8541 Personal history of malignant neoplasm of cervix uteri: Secondary | ICD-10-CM | POA: Diagnosis not present

## 2020-05-18 DIAGNOSIS — G936 Cerebral edema: Secondary | ICD-10-CM | POA: Diagnosis not present

## 2020-05-18 DIAGNOSIS — I639 Cerebral infarction, unspecified: Secondary | ICD-10-CM

## 2020-05-18 DIAGNOSIS — J9690 Respiratory failure, unspecified, unspecified whether with hypoxia or hypercapnia: Secondary | ICD-10-CM

## 2020-05-18 DIAGNOSIS — Z20822 Contact with and (suspected) exposure to covid-19: Secondary | ICD-10-CM | POA: Diagnosis present

## 2020-05-18 DIAGNOSIS — D509 Iron deficiency anemia, unspecified: Secondary | ICD-10-CM | POA: Diagnosis present

## 2020-05-18 DIAGNOSIS — J811 Chronic pulmonary edema: Secondary | ICD-10-CM | POA: Diagnosis present

## 2020-05-18 DIAGNOSIS — N1330 Unspecified hydronephrosis: Secondary | ICD-10-CM | POA: Diagnosis present

## 2020-05-18 DIAGNOSIS — Z66 Do not resuscitate: Secondary | ICD-10-CM | POA: Diagnosis not present

## 2020-05-18 DIAGNOSIS — G4700 Insomnia, unspecified: Secondary | ICD-10-CM | POA: Diagnosis present

## 2020-05-18 DIAGNOSIS — E8889 Other specified metabolic disorders: Secondary | ICD-10-CM | POA: Diagnosis present

## 2020-05-18 DIAGNOSIS — Z9911 Dependence on respirator [ventilator] status: Secondary | ICD-10-CM | POA: Diagnosis not present

## 2020-05-18 DIAGNOSIS — Z9119 Patient's noncompliance with other medical treatment and regimen: Secondary | ICD-10-CM

## 2020-05-18 DIAGNOSIS — A419 Sepsis, unspecified organism: Principal | ICD-10-CM | POA: Diagnosis present

## 2020-05-18 DIAGNOSIS — R918 Other nonspecific abnormal finding of lung field: Secondary | ICD-10-CM | POA: Diagnosis not present

## 2020-05-18 DIAGNOSIS — E8770 Fluid overload, unspecified: Secondary | ICD-10-CM | POA: Diagnosis present

## 2020-05-18 DIAGNOSIS — Y95 Nosocomial condition: Secondary | ICD-10-CM | POA: Diagnosis present

## 2020-05-18 DIAGNOSIS — Z638 Other specified problems related to primary support group: Secondary | ICD-10-CM

## 2020-05-18 DIAGNOSIS — G9341 Metabolic encephalopathy: Secondary | ICD-10-CM | POA: Diagnosis present

## 2020-05-18 DIAGNOSIS — R531 Weakness: Secondary | ICD-10-CM | POA: Diagnosis not present

## 2020-05-18 DIAGNOSIS — F32A Depression, unspecified: Secondary | ICD-10-CM | POA: Diagnosis present

## 2020-05-18 DIAGNOSIS — R6521 Severe sepsis with septic shock: Secondary | ICD-10-CM | POA: Diagnosis present

## 2020-05-18 DIAGNOSIS — R197 Diarrhea, unspecified: Secondary | ICD-10-CM | POA: Diagnosis not present

## 2020-05-18 DIAGNOSIS — B9689 Other specified bacterial agents as the cause of diseases classified elsewhere: Secondary | ICD-10-CM | POA: Diagnosis present

## 2020-05-18 DIAGNOSIS — Z801 Family history of malignant neoplasm of trachea, bronchus and lung: Secondary | ICD-10-CM

## 2020-05-18 DIAGNOSIS — R579 Shock, unspecified: Secondary | ICD-10-CM | POA: Diagnosis not present

## 2020-05-18 DIAGNOSIS — Z888 Allergy status to other drugs, medicaments and biological substances status: Secondary | ICD-10-CM

## 2020-05-18 DIAGNOSIS — M50223 Other cervical disc displacement at C6-C7 level: Secondary | ICD-10-CM | POA: Diagnosis not present

## 2020-05-18 DIAGNOSIS — Z452 Encounter for adjustment and management of vascular access device: Secondary | ICD-10-CM | POA: Diagnosis not present

## 2020-05-18 DIAGNOSIS — R627 Adult failure to thrive: Secondary | ICD-10-CM | POA: Diagnosis present

## 2020-05-18 DIAGNOSIS — J9600 Acute respiratory failure, unspecified whether with hypoxia or hypercapnia: Secondary | ICD-10-CM

## 2020-05-18 DIAGNOSIS — I10 Essential (primary) hypertension: Secondary | ICD-10-CM | POA: Diagnosis not present

## 2020-05-18 DIAGNOSIS — Z923 Personal history of irradiation: Secondary | ICD-10-CM

## 2020-05-18 DIAGNOSIS — Z9221 Personal history of antineoplastic chemotherapy: Secondary | ICD-10-CM

## 2020-05-18 LAB — I-STAT ARTERIAL BLOOD GAS, ED
Acid-base deficit: 1 mmol/L (ref 0.0–2.0)
Bicarbonate: 22.9 mmol/L (ref 20.0–28.0)
Calcium, Ion: 1.04 mmol/L — ABNORMAL LOW (ref 1.15–1.40)
HCT: 31 % — ABNORMAL LOW (ref 36.0–46.0)
Hemoglobin: 10.5 g/dL — ABNORMAL LOW (ref 12.0–15.0)
O2 Saturation: 97 %
Potassium: 5.2 mmol/L — ABNORMAL HIGH (ref 3.5–5.1)
Sodium: 118 mmol/L — CL (ref 135–145)
TCO2: 24 mmol/L (ref 22–32)
pCO2 arterial: 34.2 mmHg (ref 32.0–48.0)
pH, Arterial: 7.434 (ref 7.350–7.450)
pO2, Arterial: 83 mmHg (ref 83.0–108.0)

## 2020-05-18 LAB — COMPREHENSIVE METABOLIC PANEL
ALT: 10 U/L (ref 0–44)
ALT: 11 U/L (ref 0–44)
AST: 13 U/L — ABNORMAL LOW (ref 15–41)
AST: 21 U/L (ref 15–41)
Albumin: 3.3 g/dL — ABNORMAL LOW (ref 3.5–5.0)
Albumin: 3.2 g/dL — ABNORMAL LOW (ref 3.5–5.0)
Alkaline Phosphatase: 94 U/L (ref 38–126)
Alkaline Phosphatase: 103 U/L (ref 38–126)
Anion gap: 16 — ABNORMAL HIGH (ref 5–15)
Anion gap: 17 — ABNORMAL HIGH (ref 5–15)
BUN: 85 mg/dL — ABNORMAL HIGH (ref 8–23)
BUN: 90 mg/dL — ABNORMAL HIGH (ref 8–23)
CO2: 20 mmol/L — ABNORMAL LOW (ref 22–32)
CO2: 19 mmol/L — ABNORMAL LOW (ref 22–32)
Calcium: 8.3 mg/dL — ABNORMAL LOW (ref 8.9–10.3)
Calcium: 8.2 mg/dL — ABNORMAL LOW (ref 8.9–10.3)
Chloride: 83 mmol/L — ABNORMAL LOW (ref 98–111)
Chloride: 82 mmol/L — ABNORMAL LOW (ref 98–111)
Creatinine, Ser: 10.02 mg/dL — ABNORMAL HIGH (ref 0.44–1.00)
Creatinine, Ser: 10.53 mg/dL — ABNORMAL HIGH (ref 0.44–1.00)
GFR, Estimated: 4 mL/min — ABNORMAL LOW (ref >60)
GFR, Estimated: 4 mL/min — ABNORMAL LOW (ref >60)
Glucose, Bld: 113 mg/dL — ABNORMAL HIGH (ref 70–99)
Glucose, Bld: 234 mg/dL — ABNORMAL HIGH (ref 70–99)
Potassium: 5.5 mmol/L — ABNORMAL HIGH (ref 3.5–5.1)
Potassium: 5.8 mmol/L — ABNORMAL HIGH (ref 3.5–5.1)
Sodium: 119 mmol/L — CL (ref 135–145)
Sodium: 118 mmol/L — CL (ref 135–145)
Total Bilirubin: 0.7 mg/dL (ref 0.3–1.2)
Total Bilirubin: 0.9 mg/dL (ref 0.3–1.2)
Total Protein: 6.5 g/dL (ref 6.5–8.1)
Total Protein: 6.5 g/dL (ref 6.5–8.1)

## 2020-05-18 LAB — POCT I-STAT 7, (LYTES, BLD GAS, ICA,H+H)
Acid-base deficit: 2 mmol/L (ref 0.0–2.0)
Bicarbonate: 23.9 mmol/L (ref 20.0–28.0)
Calcium, Ion: 1.05 mmol/L — ABNORMAL LOW (ref 1.15–1.40)
HCT: 31 % — ABNORMAL LOW (ref 36.0–46.0)
Hemoglobin: 10.5 g/dL — ABNORMAL LOW (ref 12.0–15.0)
O2 Saturation: 100 %
Patient temperature: 100
Potassium: 5.8 mmol/L — ABNORMAL HIGH (ref 3.5–5.1)
Sodium: 118 mmol/L — CL (ref 135–145)
TCO2: 25 mmol/L (ref 22–32)
pCO2 arterial: 45.8 mmHg (ref 32.0–48.0)
pH, Arterial: 7.33 — ABNORMAL LOW (ref 7.350–7.450)
pO2, Arterial: 498 mmHg — ABNORMAL HIGH (ref 83.0–108.0)

## 2020-05-18 LAB — CBC WITH DIFFERENTIAL/PLATELET
Abs Immature Granulocytes: 0.1 10*3/uL — ABNORMAL HIGH (ref 0.00–0.07)
Basophils Absolute: 0 10*3/uL (ref 0.0–0.1)
Basophils Relative: 0 %
Eosinophils Absolute: 0.1 10*3/uL (ref 0.0–0.5)
Eosinophils Relative: 1 %
HCT: 30.5 % — ABNORMAL LOW (ref 36.0–46.0)
Hemoglobin: 10.5 g/dL — ABNORMAL LOW (ref 12.0–15.0)
Immature Granulocytes: 1 %
Lymphocytes Relative: 5 %
Lymphs Abs: 0.8 10*3/uL (ref 0.7–4.0)
MCH: 30.8 pg (ref 26.0–34.0)
MCHC: 34.4 g/dL (ref 30.0–36.0)
MCV: 89.4 fL (ref 80.0–100.0)
Monocytes Absolute: 0.8 10*3/uL (ref 0.1–1.0)
Monocytes Relative: 5 %
Neutro Abs: 13.4 10*3/uL — ABNORMAL HIGH (ref 1.7–7.7)
Neutrophils Relative %: 88 %
Platelets: 310 10*3/uL (ref 150–400)
RBC: 3.41 MIL/uL — ABNORMAL LOW (ref 3.87–5.11)
RDW: 15 % (ref 11.5–15.5)
WBC: 15.2 10*3/uL — ABNORMAL HIGH (ref 4.0–10.5)
nRBC: 0 % (ref 0.0–0.2)

## 2020-05-18 LAB — CSF CELL COUNT WITH DIFFERENTIAL
RBC Count, CSF: 26 /mm3 — ABNORMAL HIGH
Tube #: 3
WBC, CSF: 2 /mm3 (ref 0–5)

## 2020-05-18 LAB — LIPASE, BLOOD: Lipase: 33 U/L (ref 11–51)

## 2020-05-18 LAB — CBC
HCT: 29.2 % — ABNORMAL LOW (ref 36.0–46.0)
Hemoglobin: 10.2 g/dL — ABNORMAL LOW (ref 12.0–15.0)
MCH: 31.5 pg (ref 26.0–34.0)
MCHC: 34.9 g/dL (ref 30.0–36.0)
MCV: 90.1 fL (ref 80.0–100.0)
Platelets: 420 10*3/uL — ABNORMAL HIGH (ref 150–400)
RBC: 3.24 MIL/uL — ABNORMAL LOW (ref 3.87–5.11)
RDW: 15.2 % (ref 11.5–15.5)
WBC: 24.2 10*3/uL — ABNORMAL HIGH (ref 4.0–10.5)
nRBC: 0 % (ref 0.0–0.2)

## 2020-05-18 LAB — PROCALCITONIN: Procalcitonin: 0.65 ng/mL

## 2020-05-18 LAB — RESP PANEL BY RT-PCR (FLU A&B, COVID) ARPGX2
Influenza A by PCR: NEGATIVE
Influenza B by PCR: NEGATIVE
SARS Coronavirus 2 by RT PCR: NEGATIVE

## 2020-05-18 LAB — SODIUM: Sodium: 120 mmol/L — ABNORMAL LOW (ref 135–145)

## 2020-05-18 LAB — TSH: TSH: 4.554 u[IU]/mL — ABNORMAL HIGH (ref 0.350–4.500)

## 2020-05-18 LAB — PHOSPHORUS: Phosphorus: 6 mg/dL — ABNORMAL HIGH (ref 2.5–4.6)

## 2020-05-18 LAB — HIV ANTIBODY (ROUTINE TESTING W REFLEX): HIV Screen 4th Generation wRfx: NONREACTIVE

## 2020-05-18 LAB — CORTISOL: Cortisol, Plasma: 24.8 ug/dL

## 2020-05-18 LAB — MAGNESIUM: Magnesium: 3 mg/dL — ABNORMAL HIGH (ref 1.7–2.4)

## 2020-05-18 LAB — GLUCOSE, CSF: Glucose, CSF: 95 mg/dL — ABNORMAL HIGH (ref 40–70)

## 2020-05-18 LAB — PROTIME-INR
INR: 1.1 (ref 0.8–1.2)
Prothrombin Time: 14.1 seconds (ref 11.4–15.2)

## 2020-05-18 LAB — PROTEIN, CSF: Total  Protein, CSF: 60 mg/dL — ABNORMAL HIGH (ref 15–45)

## 2020-05-18 LAB — APTT: aPTT: 33 seconds (ref 24–36)

## 2020-05-18 LAB — AMYLASE: Amylase: 32 U/L (ref 28–100)

## 2020-05-18 LAB — AMMONIA: Ammonia: 16 umol/L (ref 9–35)

## 2020-05-18 IMAGING — CT CT ANGIO HEAD
2 of 11 series · 6 of 34 positions shown · IV contrast (OMNI 350)
Comparison: None.

CLINICAL DATA: Neuro deficit with stroke suspected.

Altered mental status
EXAM:
CT ANGIOGRAPHY HEAD AND NECK
TECHNIQUE: Multidetector CT imaging of the head and neck was performed using
the standard protocol during bolus administration of intravenous
contrast. Multiplanar CT image reconstructions and MIPs were
obtained to evaluate the vascular anatomy. Carotid stenosis
measurements (when applicable) are obtained utilizing NASCET
criteria, using the distal internal carotid diameter as the
denominator.
CONTRAST:  60mL OMNIPAQUE IOHEXOL 350 MG/ML SOLN

[Series 9: sagittal · sagittal · 0.31mm/px · 1 of 63 slices shown]
[im 5/63  soft-tissue]
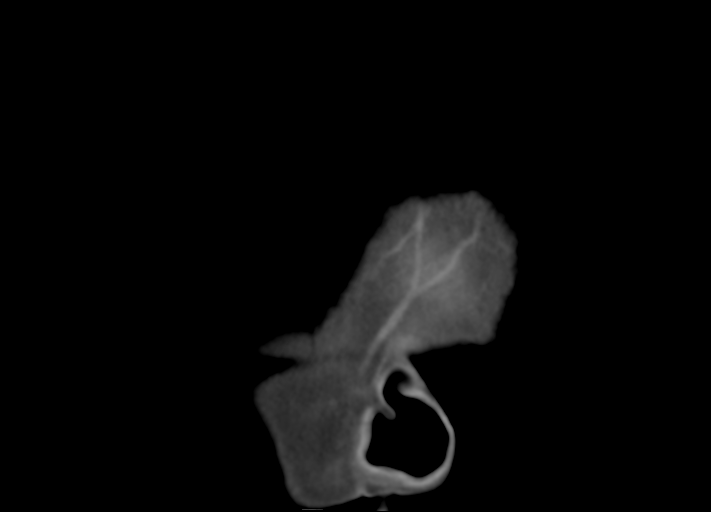

[Series 12: cta neck axial · axial · 0.39mm/px · z∈[-258,-34]mm · 5 of 336 slices shown]
[im 56/336  soft-tissue]
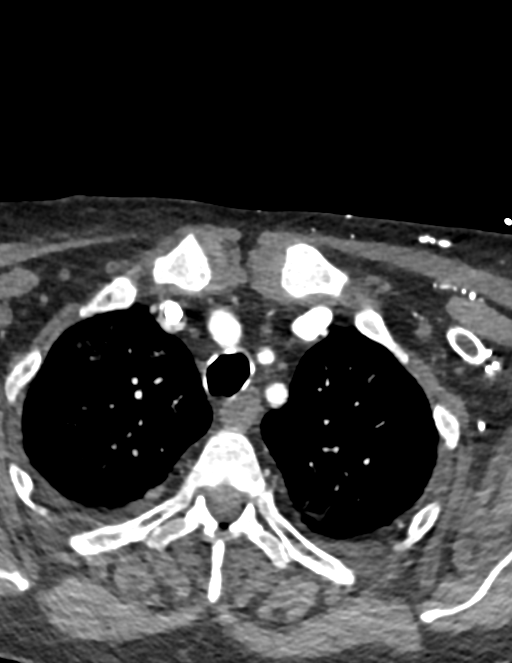
[im 112/336  bone]
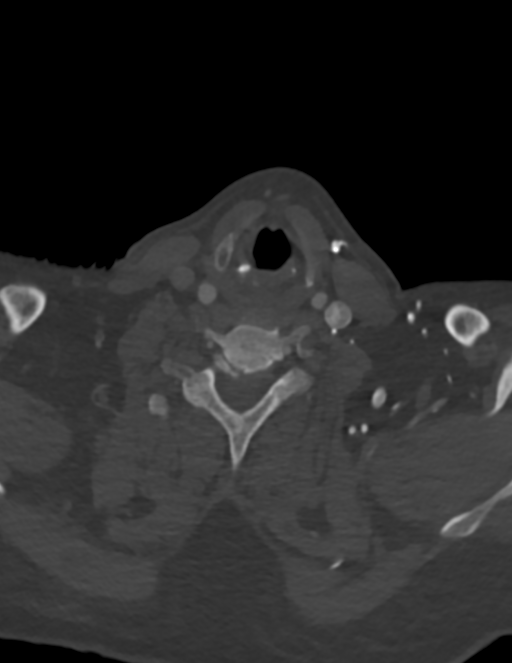
[im 168/336  soft-tissue]
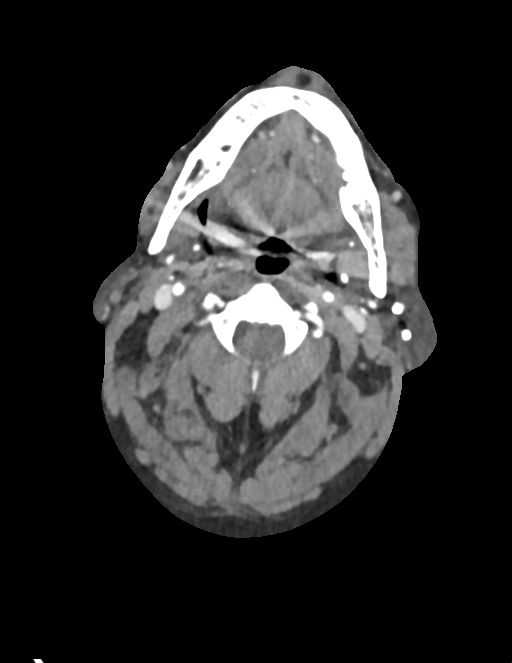
[im 224/336  bone]
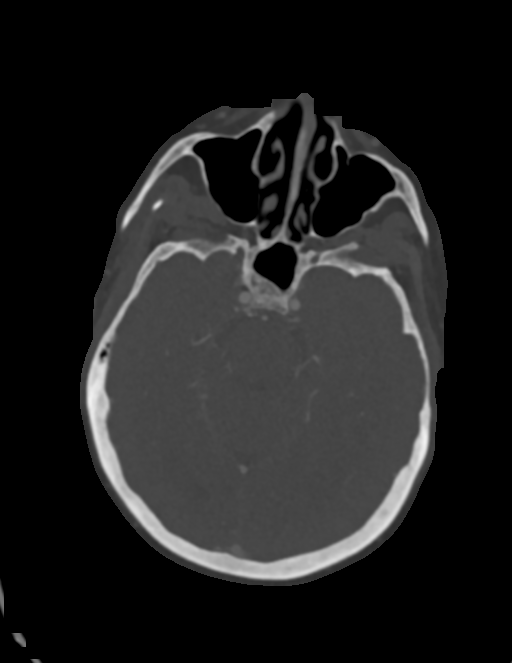
[im 280/336  soft-tissue]
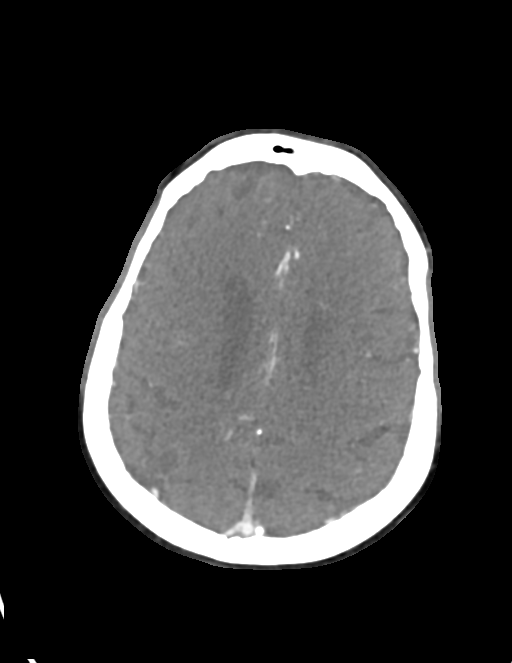

[6 of 34 positions shown; findings below may reference images not displayed]

FINDINGS: CT HEAD FINDINGS

Brain: No evidence of acute infarction, hemorrhage, hydrocephalus,
extra-axial collection or mass lesion/mass effect. Central
predominant cerebral volume loss.

Vascular: See below

Skull: Normal. Negative for fracture or focal lesion.

Sinuses: Negative

Orbits: Negative

Review of the MIP images confirms the above findings

CTA NECK FINDINGS

Aortic arch: Negative 3 vessel arch.

Right carotid system: No evidence of dissection, stenosis (50% or
greater) or occlusion. No notable atheromatous changes.

Left carotid system: No evidence of dissection, stenosis (50% or
greater) or occlusion. No notable atheromatous changes

Vertebral arteries: Proximal subclavian atherosclerosis.

Skeleton: Generally coarsened trabeculae, suspected renal
osteodystrophy. No acute finding.

Other neck: Negative

Upper chest: Extensive left sided venous stenting. The left upper
extremity was injected with no enhancement of the left axillary or
subclavian veins at the level of stenting, but with well-formed
collaterals. Right-sided dialysis catheter in place.

Small layering pleural effusions. Patchy bilateral pulmonary opacity
with interlobular septal thickening, overall favor edema in this
patient no missed recent dialysis.

Review of the MIP images confirms the above findings

CTA HEAD FINDINGS

Anterior circulation: Atheromatous calcification along the carotid
siphons. No branch occlusion, beading, or aneurysm.

Posterior circulation: Vertebral and basilar arteries are smooth and
diffusely patent. Fetal type right PCA. No branch occlusion,
beading, or aneurysm.

Venous sinuses: Diffusely patent

Anatomic variants: As above

Review of the MIP images confirms the above findings
IMPRESSION: 1. No emergent intracranial or vascular finding.
2. Stented left subclavian and axillary veins with stent occlusion
and well-formed collaterals.
3. Pulmonary edema/small pleural effusions.

## 2020-05-18 IMAGING — CT CT ANGIO NECK
1 series · 1 of 1 positions shown · IV contrast (omnipaque)
Comparison: None.

CLINICAL DATA: Neuro deficit with stroke suspected.

Altered mental status
EXAM:
CT ANGIOGRAPHY HEAD AND NECK
TECHNIQUE: Multidetector CT imaging of the head and neck was performed using
the standard protocol during bolus administration of intravenous
contrast. Multiplanar CT image reconstructions and MIPs were
obtained to evaluate the vascular anatomy. Carotid stenosis
measurements (when applicable) are obtained utilizing NASCET
criteria, using the distal internal carotid diameter as the
denominator.
CONTRAST:  60mL OMNIPAQUE IOHEXOL 350 MG/ML SOLN

[Series 2: topogram 0.6 t20f · coronal · 1.00mm/px · 1 of 1 slices shown]
[im 1/1]
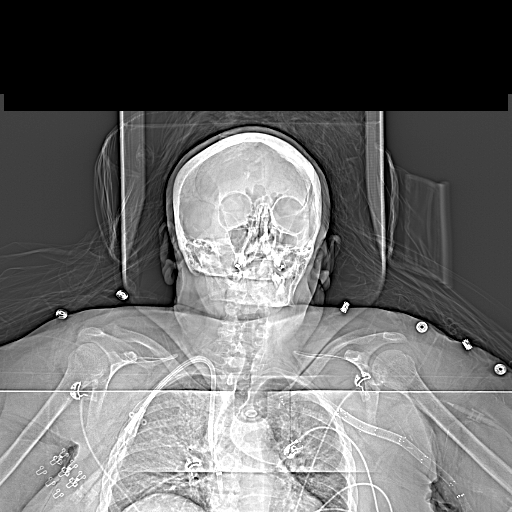

[1 of 1 positions shown; findings below may reference images not displayed]

FINDINGS: CT HEAD FINDINGS

Brain: No evidence of acute infarction, hemorrhage, hydrocephalus,
extra-axial collection or mass lesion/mass effect. Central
predominant cerebral volume loss.

Vascular: See below

Skull: Normal. Negative for fracture or focal lesion.

Sinuses: Negative

Orbits: Negative

Review of the MIP images confirms the above findings

CTA NECK FINDINGS

Aortic arch: Negative 3 vessel arch.

Right carotid system: No evidence of dissection, stenosis (50% or
greater) or occlusion. No notable atheromatous changes.

Left carotid system: No evidence of dissection, stenosis (50% or
greater) or occlusion. No notable atheromatous changes

Vertebral arteries: Proximal subclavian atherosclerosis.

Skeleton: Generally coarsened trabeculae, suspected renal
osteodystrophy. No acute finding.

Other neck: Negative

Upper chest: Extensive left sided venous stenting. The left upper
extremity was injected with no enhancement of the left axillary or
subclavian veins at the level of stenting, but with well-formed
collaterals. Right-sided dialysis catheter in place.

Small layering pleural effusions. Patchy bilateral pulmonary opacity
with interlobular septal thickening, overall favor edema in this
patient no missed recent dialysis.

Review of the MIP images confirms the above findings

CTA HEAD FINDINGS

Anterior circulation: Atheromatous calcification along the carotid
siphons. No branch occlusion, beading, or aneurysm.

Posterior circulation: Vertebral and basilar arteries are smooth and
diffusely patent. Fetal type right PCA. No branch occlusion,
beading, or aneurysm.

Venous sinuses: Diffusely patent

Anatomic variants: As above

Review of the MIP images confirms the above findings
IMPRESSION: 1. No emergent intracranial or vascular finding.
2. Stented left subclavian and axillary veins with stent occlusion
and well-formed collaterals.
3. Pulmonary edema/small pleural effusions.

## 2020-05-18 IMAGING — DX DG ABDOMEN 1V
1 series · 2 of 2 positions shown · non-contrast
Comparison: None.

CLINICAL DATA: OG tube placement.

EXAM:
ABDOMEN - 1 VIEW

[Series 1: abdomen · 0.14mm/px · 2 of 2 slices shown]
[im 1/2]
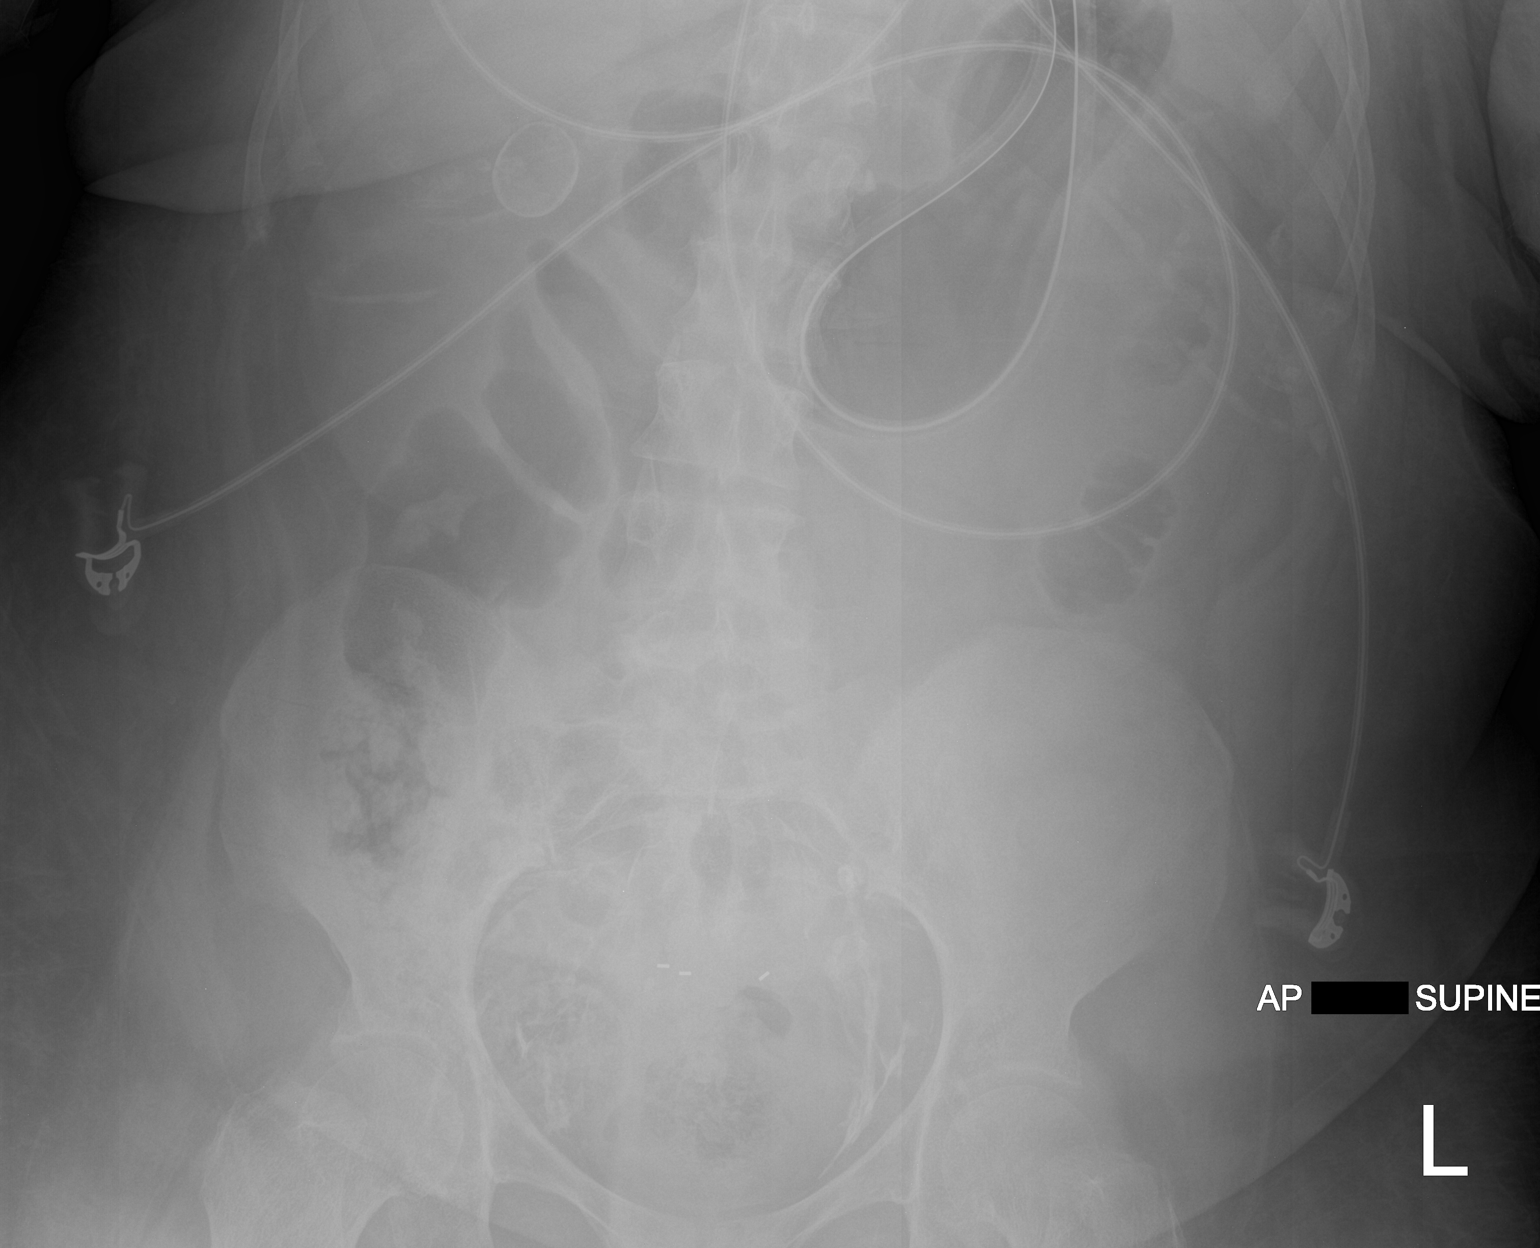
[im 2/2]
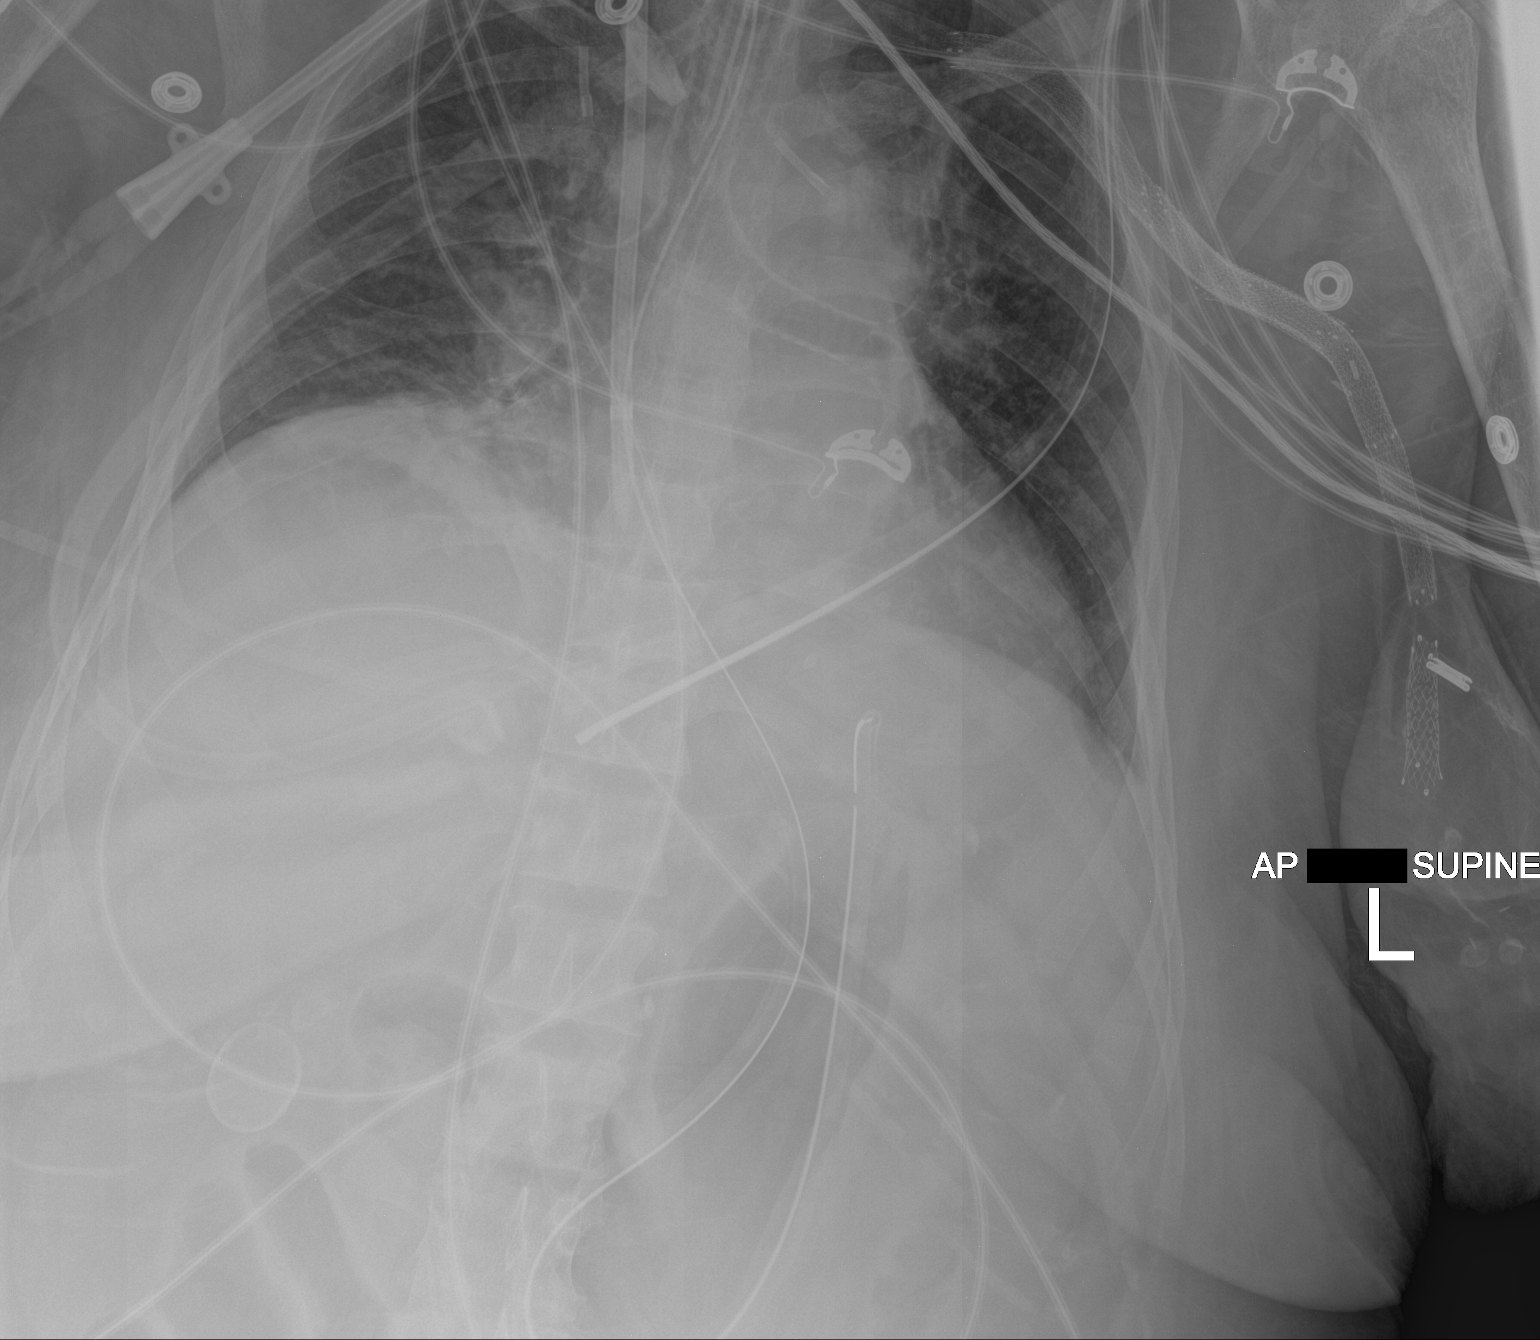

[2 of 2 positions shown; findings below may reference images not displayed]

FINDINGS: The enteric tube is looped in the stomach, tip and side ports in the
region of the gastric fundus. There is no bowel dilatation to
suggest obstruction. Presumed gallstones in the right upper
quadrant. Advanced vascular calcifications. Vascular stent in the
left axilla partially included.
IMPRESSION: Enteric tube looped in the stomach with tip and side-port in the
region of the gastric fundus.

## 2020-05-18 IMAGING — DX DG CHEST 1V PORT
1 series · 1 of 1 positions shown · non-contrast
Comparison: [DATE]

CLINICAL DATA: Altered mental status

EXAM:
PORTABLE CHEST 1 VIEW

[chest ap]
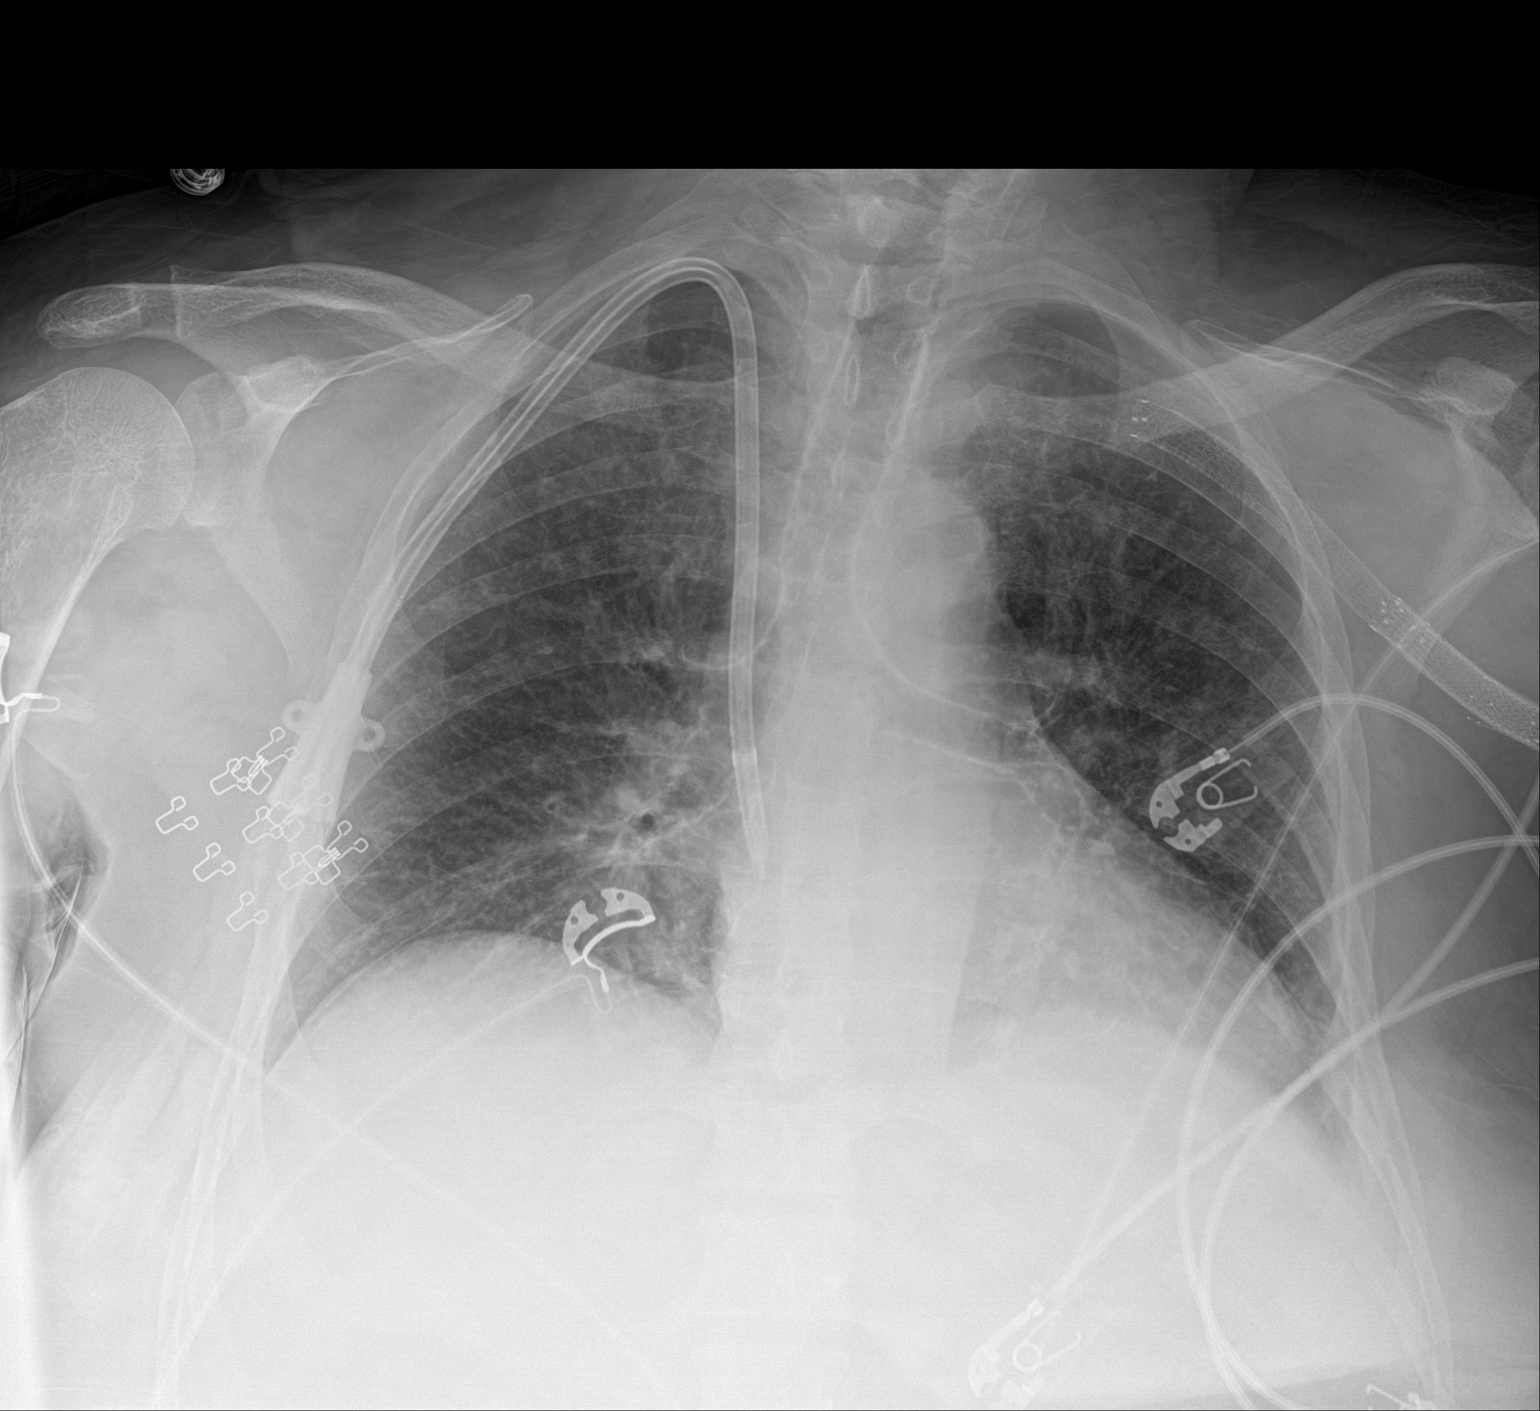

[1 of 1 positions shown; findings below may reference images not displayed]

FINDINGS: Normal heart size for technique. Dialysis catheter on the right with
tip at the upper right atrium. Extensive venous stenting on the
left. Mild interstitial prominence without Kerley lines or effusion.
No focal consolidation. No pneumothorax.
IMPRESSION: Low volume chest with borderline vascular congestion.

## 2020-05-18 IMAGING — DX DG CHEST 1V PORT
1 series · 1 of 1 positions shown · non-contrast
Comparison: Single-view of the chest earlier today.

CLINICAL DATA: Status post intubation.  Altered mental status.

EXAM:
PORTABLE CHEST 1 VIEW

[chest ap]
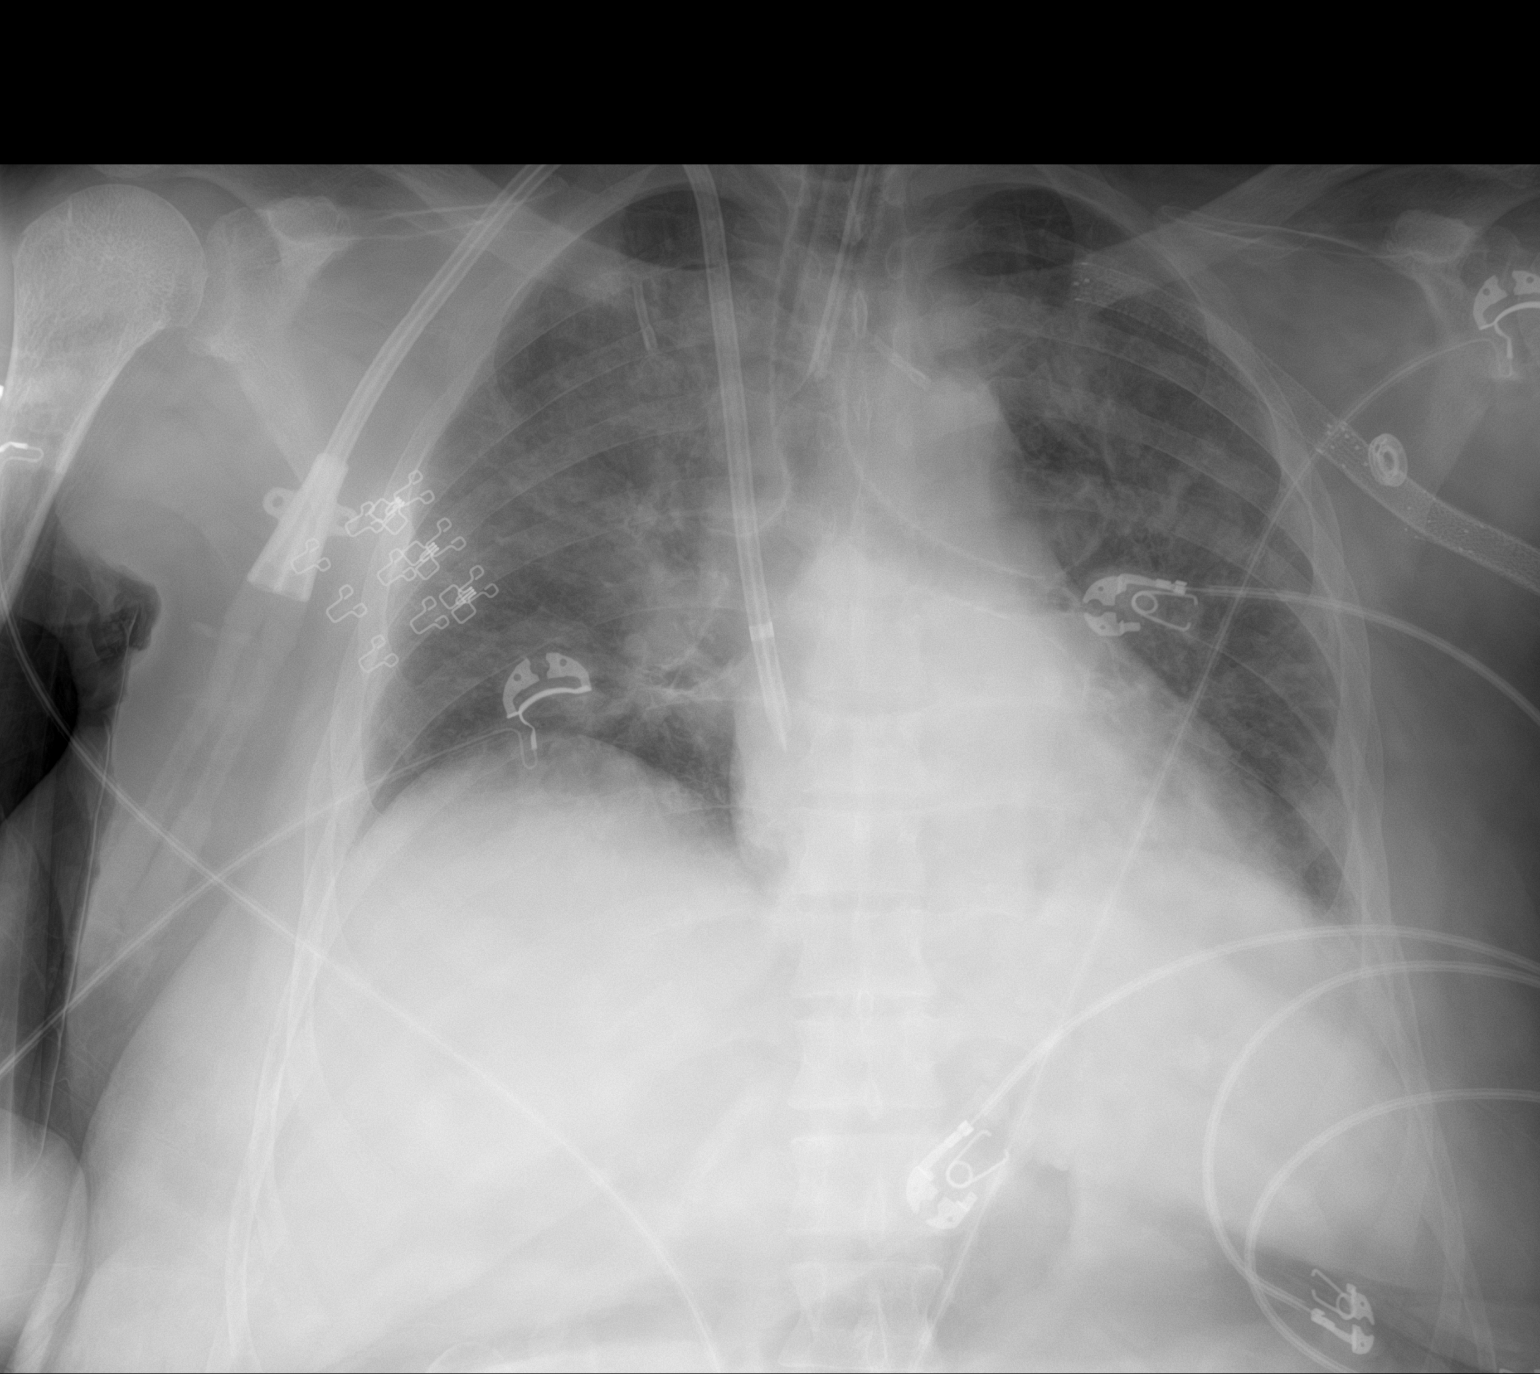

[1 of 1 positions shown; findings below may reference images not displayed]

FINDINGS: New endotracheal tube is in good position with the tip just below
the clavicular heads, 4 cm above the carina. Dialysis catheter is
unchanged. Aeration is much worse than on the prior examination with
hazy opacities in the upper lung zones and bases now present. No
pneumothorax. Heart size is normal.
IMPRESSION: ETT in good position.

New hazy bilateral pulmonary opacities may be due to pulmonary
edema.

## 2020-05-18 IMAGING — RF DG FLUORO GUIDE SPINAL/SI JT INJ*L*
1 series · 1 of 1 positions shown · non-contrast
Comparison: Head CT [DATE]

CLINICAL DATA: Mental status changes.  Possible encephalitis.

EXAM:
DIAGNOSTIC LUMBAR PUNCTURE UNDER FLUOROSCOPIC GUIDANCE

[Series 1: cp_standard · 0.17mm/px · 1 of 1 slices shown]
[im 1/1]
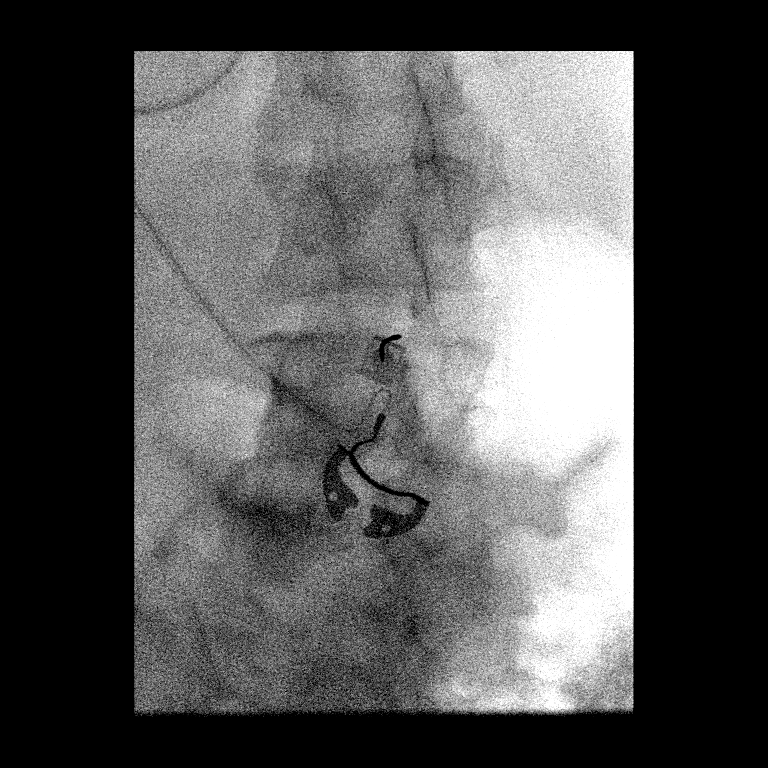

[1 of 1 positions shown; findings below may reference images not displayed]

FLUOROSCOPY TIME:  Fluoroscopy Time:  2 minutes and 30 second

Radiation Exposure Index (if provided by the fluoroscopic device):
Not applicable. Left

Number of Acquired Spot Images: 0

PROCEDURE:
The [REDACTED] could not obtain informed consent from the
patient or the patient's family prior to attempting there bedside
lumbar puncture. Therefore, Dr.HERARD and his colleague decided
that the procedure was necessary. With the patient prone, the lower
back was prepped with Betadine. 1% Lidocaine was used for local
anesthesia. Lumbar puncture was performed at the L3-4 level using a
20 gauge needle with return of clear CSF with an opening pressure of
27 cm water. 10 ml of CSF were obtained for laboratory studies. The
patient tolerated the procedure well and there were no apparent
complications.
IMPRESSION: Non complicated lumbar puncture, as detailed above.

Opening pressure of 27 cm of CSF.

## 2020-05-18 MED ORDER — FENTANYL 2500MCG IN NS 250ML (10MCG/ML) PREMIX INFUSION
25.0000 ug/h | INTRAVENOUS | Status: DC
  Administered 2020-05-19: 50 ug/h via INTRAVENOUS
  Filled 2020-05-18: qty 250

## 2020-05-18 MED ORDER — SODIUM CHLORIDE 0.9 % IV BOLUS
500.0000 mL | Freq: Once | INTRAVENOUS | Status: AC
  Administered 2020-05-18: 500 mL via INTRAVENOUS

## 2020-05-18 MED ORDER — CHLORHEXIDINE GLUCONATE 0.12% ORAL RINSE (MEDLINE KIT)
15.0000 mL | Freq: Two times a day (BID) | OROMUCOSAL | Status: DC
  Administered 2020-05-18 – 2020-05-31 (×26): 15 mL via OROMUCOSAL

## 2020-05-18 MED ORDER — PROPOFOL 1000 MG/100ML IV EMUL
5.0000 ug/kg/min | INTRAVENOUS | Status: DC
  Administered 2020-05-18: 5 ug/kg/min via INTRAVENOUS
  Filled 2020-05-18: qty 100

## 2020-05-18 MED ORDER — ORAL CARE MOUTH RINSE
15.0000 mL | OROMUCOSAL | Status: DC
  Administered 2020-05-18 – 2020-05-31 (×127): 15 mL via OROMUCOSAL

## 2020-05-18 MED ORDER — LEVETIRACETAM IN NACL 500 MG/100ML IV SOLN
500.0000 mg | Freq: Two times a day (BID) | INTRAVENOUS | Status: DC
  Administered 2020-05-19 – 2020-05-23 (×10): 500 mg via INTRAVENOUS
  Filled 2020-05-18 (×10): qty 100

## 2020-05-18 MED ORDER — PROPOFOL 1000 MG/100ML IV EMUL
0.0000 ug/kg/min | INTRAVENOUS | Status: DC
  Administered 2020-05-18: 45 ug/kg/min via INTRAVENOUS
  Administered 2020-05-18: 50 ug/kg/min via INTRAVENOUS
  Administered 2020-05-18: 80 ug/kg/min via INTRAVENOUS
  Administered 2020-05-19: 50 ug/kg/min via INTRAVENOUS
  Filled 2020-05-18 (×5): qty 100

## 2020-05-18 MED ORDER — POLYETHYLENE GLYCOL 3350 17 G PO PACK: 17.0000 g | PACK | Freq: Every day | ORAL | Status: DC | PRN

## 2020-05-18 MED ORDER — POLYETHYLENE GLYCOL 3350 17 G PO PACK
17.0000 g | PACK | Freq: Every day | ORAL | Status: DC
Start: 2020-05-18 — End: 2020-05-22
  Administered 2020-05-19: 17 g
  Filled 2020-05-18 (×2): qty 1

## 2020-05-18 MED ORDER — IOHEXOL 350 MG/ML SOLN
60.0000 mL | Freq: Once | INTRAVENOUS | Status: AC | PRN
  Administered 2020-05-18: 60 mL via INTRAVENOUS

## 2020-05-18 MED ORDER — FENTANYL CITRATE (PF) 100 MCG/2ML IJ SOLN
50.0000 ug | INTRAMUSCULAR | Status: DC | PRN
  Administered 2020-05-18 (×3): 100 ug via INTRAVENOUS
  Filled 2020-05-18: qty 2
  Filled 2020-05-18: qty 4
  Filled 2020-05-18: qty 2

## 2020-05-18 MED ORDER — CHLORHEXIDINE GLUCONATE CLOTH 2 % EX PADS
6.0000 | MEDICATED_PAD | Freq: Every day | CUTANEOUS | Status: DC
  Administered 2020-05-19 – 2020-05-30 (×14): 6 via TOPICAL

## 2020-05-18 MED ORDER — DOCUSATE SODIUM 50 MG/5ML PO LIQD
100.0000 mg | Freq: Two times a day (BID) | ORAL | Status: DC
Start: 2020-05-18 — End: 2020-05-31
  Administered 2020-05-19 – 2020-05-31 (×10): 100 mg
  Filled 2020-05-18 (×14): qty 10

## 2020-05-18 MED ORDER — LEVETIRACETAM IN NACL 1500 MG/100ML IV SOLN
1500.0000 mg | Freq: Once | INTRAVENOUS | Status: AC
  Administered 2020-05-18: 1500 mg via INTRAVENOUS
  Filled 2020-05-18: qty 100

## 2020-05-18 MED ORDER — SODIUM CHLORIDE 0.9 % IV SOLN: INTRAVENOUS | Status: DC

## 2020-05-18 MED ORDER — HEPARIN SODIUM (PORCINE) 1000 UNIT/ML IJ SOLN
INTRAMUSCULAR | Status: AC
  Filled 2020-05-18: qty 4

## 2020-05-18 MED ORDER — DEXTROSE 5 % IV SOLN
5.0000 mg/kg | INTRAVENOUS | Status: DC
  Administered 2020-05-18: 400 mg via INTRAVENOUS
  Filled 2020-05-18 (×2): qty 8

## 2020-05-18 MED ORDER — ETOMIDATE 2 MG/ML IV SOLN
INTRAVENOUS | Status: AC | PRN
  Administered 2020-05-18: 20 mg via INTRAVENOUS

## 2020-05-18 MED ORDER — MIDAZOLAM 50MG/50ML (1MG/ML) PREMIX INFUSION
0.5000 mg/h | INTRAVENOUS | Status: DC
  Administered 2020-05-18: 1 mg/h via INTRAVENOUS
  Administered 2020-05-19 (×2): 2 mg/h via INTRAVENOUS
  Administered 2020-05-19: 1 mg/h via INTRAVENOUS
  Filled 2020-05-18 (×3): qty 50

## 2020-05-18 MED ORDER — PANTOPRAZOLE SODIUM 40 MG IV SOLR: 40.0000 mg | Freq: Every day | INTRAVENOUS | Status: DC

## 2020-05-18 MED ORDER — SODIUM CHLORIDE 0.9 % IV SOLN
250.0000 mL | INTRAVENOUS | Status: DC
  Administered 2020-05-28: 250 mL via INTRAVENOUS

## 2020-05-18 MED ORDER — DARBEPOETIN ALFA 60 MCG/0.3ML IJ SOSY: 60.0000 ug | PREFILLED_SYRINGE | INTRAMUSCULAR | Status: DC

## 2020-05-18 MED ORDER — LORAZEPAM 2 MG/ML IJ SOLN
INTRAMUSCULAR | Status: AC
  Filled 2020-05-18: qty 1

## 2020-05-18 MED ORDER — HEPARIN SODIUM (PORCINE) 5000 UNIT/ML IJ SOLN
5000.0000 [IU] | Freq: Three times a day (TID) | INTRAMUSCULAR | Status: DC
  Administered 2020-05-19 – 2020-05-20 (×5): 5000 [IU] via SUBCUTANEOUS
  Filled 2020-05-18 (×5): qty 1

## 2020-05-18 MED ORDER — ALBUMIN HUMAN 5 % IV SOLN
12.5000 g | Freq: Once | INTRAVENOUS | Status: AC
  Administered 2020-05-18: 12.5 g via INTRAVENOUS
  Filled 2020-05-18: qty 250

## 2020-05-18 MED ORDER — ROCURONIUM BROMIDE 50 MG/5ML IV SOLN
INTRAVENOUS | Status: AC | PRN
  Administered 2020-05-18: 100 mg via INTRAVENOUS

## 2020-05-18 MED ORDER — PANTOPRAZOLE SODIUM 40 MG PO PACK
40.0000 mg | PACK | Freq: Every day | ORAL | Status: DC
  Administered 2020-05-18 – 2020-05-31 (×14): 40 mg
  Filled 2020-05-18 (×15): qty 20

## 2020-05-18 MED ORDER — FENTANYL CITRATE (PF) 100 MCG/2ML IJ SOLN: 50.0000 ug | INTRAMUSCULAR | Status: DC | PRN

## 2020-05-18 MED ORDER — LORAZEPAM 2 MG/ML IJ SOLN
INTRAMUSCULAR | Status: AC
  Administered 2020-05-18: 2 mg
  Filled 2020-05-18: qty 1

## 2020-05-18 MED ORDER — DOCUSATE SODIUM 100 MG PO CAPS: 100.0000 mg | ORAL_CAPSULE | Freq: Two times a day (BID) | ORAL | Status: DC | PRN

## 2020-05-18 MED ORDER — NOREPINEPHRINE 4 MG/250ML-% IV SOLN
2.0000 ug/min | INTRAVENOUS | Status: DC
  Administered 2020-05-18: 4 ug/min via INTRAVENOUS
  Administered 2020-05-19: 3 ug/min via INTRAVENOUS
  Administered 2020-05-20: 5 ug/min via INTRAVENOUS
  Filled 2020-05-18 (×2): qty 250

## 2020-05-18 MED ORDER — DEXTROSE 5 % IV SOLN
300.0000 mg | INTRAVENOUS | Status: DC
  Administered 2020-05-19 – 2020-05-21 (×3): 300 mg via INTRAVENOUS
  Filled 2020-05-18 (×3): qty 6

## 2020-05-18 NOTE — Consult Note (Signed)
Neurology Consultation Reason for Consult: Altered mental status Referring Physician: Tegeler, C  CC: Altered mental status  History is obtained from: Chart review  HPI: Mary Wilcox is a 65 y.o. female with a history of ESRD due to obstructive hydronephrosis from cervical cancer who was found confused today.  She had apparently been normal last night.  This morning around 4 AM, it was noted that she was altered.  She refused dialysis on 3/3.  In the emergency department, she was markedly confused, and was taken for an emergent CT/CTA which was negative.  Out of concern for possible seizures, an EEG was obtained which initially did not show any evidence of ongoing seizure, but she did have a seizure during the EEG.  There was no definite seizure seen prior to that.  She was found to be hyponatremic with a sodium of 119, and she was severely hypertensive with blood pressures in the 200s.    ROS: Unable to obtain due to altered mental status.   Past Medical History:  Diagnosis Date  . Bilateral hydronephrosis 2006   due to obstruction from cervical cancer  . Cataract    right eye  . Cervical cancer ([place]) 2006   IIIB s/p ext radiation and intracavitary cessium  . Chronic kidney disease    hx several episodes of ARF secondary to obstructive uropathy  . Depression   . ESRD on hemodialysis ([place])    [facility], TTS HD  . Hypothyroidism   . Insomnia    takes Ambien 3x wk  . Iron deficiency anemia   . Peripheral vascular disease ([place])   . Secondary hyperparathyroidism (of renal origin)   . Transfusion history    02/2011 for Hgb 6.8  . Wears glasses      Family History  Problem Relation Age of Onset  . Lung cancer Father   . Cancer Father   . Cancer Mother        kidney and thyroid  . Heart disease Brother   . Diabetes Brother   . Hyperlipidemia Brother   . Hypertension Brother   . Other Brother        varicose vein     Social History:  reports that she has  never smoked. She has never used smokeless tobacco. She reports current alcohol use. She reports that she does not use drugs.   Exam: Current vital signs: BP (!) 171/111   Pulse 87   Temp 98.2 F (36.8 C) (Oral)   Resp (!) 26   SpO2 100%  Vital signs in last 24 hours: Temp:  [98.2 F (36.8 C)] 98.2 F (36.8 C) (03/05 0702) Pulse Rate:  [84-90] 87 (03/05 0850) Resp:  [20-26] 26 (03/05 0850) BP: (171-225)/(90-111) 171/111 (03/05 0850) SpO2:  [100 %] 100 % (03/05 0850)   Physical Exam  Constitutional: Appears elderly Psych: Does not respond Eyes: No scleral injection HENT: Tongue bite, small erythematous area on the left lower lip which could be healing fever blister MSK: no joint deformities.  Cardiovascular: Normal rate and regular rhythm.  Some edema Respiratory: Effort normal, non-labored breathing GI: Soft.  No distension. There is no tenderness.  Skin: WDI  Neuro: Mental Status: Patient is obtunded, but eyes do open some to noxious stimulation, she does not fixate or track or follow commands. Cranial Nerves: II: She did not blink to threat pupils are equal, round, and reactive to light.   III,IV, VI: Eyes are roving, crossing midline in both directions V: VII:  Corneals are intact Motor: She moves all extremities spontaneously and quasi-purposefully Sensory: She withdraws from noxious stimulation in all four extremities  Cerebellar: Does not perform     I have reviewed labs in epic and the results pertinent to this consultation are: Sodium 118 Magnesium 3.0 Calcium 8.3 BUN 90   I have reviewed the images obtained: CT head-unremarkable  Impression: 65 year old female with acute encephalopathy in the setting of hyponatremia, missed dialysis with uremia, with new onset seizures.  I suspect that her metabolic state is primarily responsible for encephalopathy, though 119 is usually not low enough to get seizures it can be. Given the new onset seizures, and  significant confusion with hyponatremia, one consideration would be HSV and I attempted an LP at the bedside, but was unfortunate unsuccessful.  With her seizure, and continued encephalopathy, I would favor continuous EEG monitoring.  Posterior reversible encephalopathy syndrome would also be a possibility and an MRI would be helpful to guide pressure management.  Recommendations: 1) would treat BP as hypertensive emergency 2) continuous EEG, we will use MRI safe leads 3) MRI brain without contrast 4) Keppra 500 mg twice daily (would not increase from this given renal function) 5) neurology will continue to follow  This patient is critically ill and at significant risk of neurological worsening, death and care requires constant monitoring of vital signs, hemodynamics,respiratory and cardiac monitoring, neurological assessment, discussion with family, other specialists and medical decision making of high complexity. I spent 35 minutes of neurocritical care time  in the care of  this patient. This was time spent independent of any time provided by nurse practitioner or PA.  Roland Rack, MD [facility] [phone]  If 7pm- 7am, please page neurology on call as listed in [place]. 06/10/2020  5:57 PM

## 2020-05-18 NOTE — Progress Notes (Signed)
Pharmacy Antibiotic Note  Mary Wilcox is a 65 y.o. female admitted on 06/08/2020 with questionable herpes encephalitis.  Pharmacy has been consulted for acyclovir dosing.  Of note, patient has h/o ESRD on HD. WBC 15 > 24.   Plan: -Acyclovir 5 mg/kg (300 mg) based on adjusted body weight Q 24 hours  -Monitor cultures and clinical progress  Height: 4\' 11"  (149.9 cm) Weight: 79.8 kg (176 lb) IBW/kg (Calculated) : 43.2  Temp (24hrs), Avg:98.2 F (36.8 C), Min:98.2 F (36.8 C), Max:98.2 F (36.8 C)  Recent Labs  Lab 05/30/2020 0700 05/24/2020 1347  WBC 15.2* 24.2*  CREATININE 10.02*  --     Estimated Creatinine Clearance: 5 mL/min (A) (by C-G formula based on SCr of 10.02 mg/dL (H)).    Allergies  Allergen Reactions  . Prednisone     Other reaction(s): severe muscle weakness in legs  . Prozac [Fluoxetine Hcl] Hives    Antimicrobials this admission: Acyclovir 3/5 >>   Dose adjustments this admission:   Microbiology results:   Thank you for allowing pharmacy to be a part of this patient's care.  Albertina Parr, PharmD., BCPS, BCCCP Clinical Pharmacist Please refer to [facility] for unit-specific pharmacist

## 2020-05-18 NOTE — Procedures (Signed)
Indication:  New onset seizure  We attemptied to contact her daughter, but were unable to. Due to the emergent nature of the procedure, after discussion with Dr. Gustavus Messing, we decided to proceed with two physician emergency consent.   Risks of the procedure were dicussed with the patient including post-LP headache, bleeding, infection, weakness/numbness of legs(radiculopathy), death.  The patient/patient's proxy agreed and written consent was obtained.   The patient was prepped and draped, and using sterile technique a 20 gauge quinke spinal needle was inserted in the L3-4 and L5-S1 space. Despite multiple attempts, only bony resistance was met. Will arrange to perform under fluoro.   Roland Rack, MD [facility] [phone]  If 7pm- 7am, please page neurology on call as listed in [place].

## 2020-05-18 NOTE — Progress Notes (Signed)
EEG completed, result pending 

## 2020-05-18 NOTE — ED Notes (Signed)
Critical Result: NA 118. Notified Kara Mead MD

## 2020-05-18 NOTE — ED Notes (Signed)
Per Davonna Belling MD can increase propofol to a max of 98mcg w/ fentanyl prn

## 2020-05-18 NOTE — H&P (Signed)
NAME:  Mary Wilcox, MRN:  [id number], DOB:  1955/10/28, LOS: 0 ADMISSION DATE:  06/01/2020, CONSULTATION DATE:  06/11/2020  REFERRING MD:  Shelly Coss, ED PA  CHIEF COMPLAINT: Altered mental status, seizure  Brief History:  65 year old with ESRD on dialysis, NHR presented with altered mental status and hypertension, had a seizure in the ED requiring intubation for airway protection  History of Present Illness:  Resident of [facility], missed dialysis on 3/3 presented to ED with altered mental status. History obtained after review of chart since patient is currently intubated and sedated. I apparently LKN night PTA , found to be altered this morning and brought to the ED. Found to have mild leukocytosis, sodium of 119, potassium 5.5, TSH 4.5 . Neurology was consulted CT angiogram of the head and neck did not show any evidence of acute stroke. When EEG was being performed patient had seizure-like activity with sonorous breathing, given lorazepam, was emergently intubated . She was sedated with propofol, loaded with Keppra  Past Medical History:  ESRD on HD T/TH/S PAD Hypothyroidism Cervical cancer 2006 status post radiation  Significant Hospital Events:    Consults:  Renal Neurology  Procedures:  ETT 3/5 >>  Significant Diagnostic Tests:  CT angio head/ neck >> stented left subclavian and axillary veins with stent occlusion   Micro Data:  CSF 3/5 >>  Antimicrobials:   acyclovir  Interim History / Subjective:    Objective   Blood pressure (!) 242/130, pulse (!) 130, temperature 98.2 F (36.8 C), temperature source Oral, resp. rate 20, height 4\' 11"  (1.499 m), weight 79.8 kg, SpO2 92 %.    Vent Mode: PRVC FiO2 (%):  [40 %] 40 % Set Rate:  [20 bmp] 20 bmp Vt Set:  [350 mL] 350 mL PEEP:  [5 cmH20] 5 cmH20 Plateau Pressure:  [17 cmH20] 17 cmH20   Intake/Output Summary (Last 24 hours) at 06/04/2020 1314 Last data filed at 05/29/2020 1236 Gross per 24 hour  Intake 600.59 ml   Output --  Net 600.59 ml   Filed Weights   05/23/2020 1130  Weight: 79.8 kg    Examination: Gen:      elderly obese woman, no distress , intubated, sedated on propofol HEENT:  EOMI, sclera anicteric, mild pallor Neck:     No JVD; no thyromegaly Lungs:    Bilateral ventilated breath sounds, no accessory muscle use CV:         Regular rate and rhythm; no murmurs Abd:      + bowel sounds; soft, non-tender; no palpable masses, no distension Ext:    No edema; adequate peripheral perfusion Skin:      Warm and dry; no rash Neuro: Sedated, RASS -3, not overbreathing the vent likely residual paralytic, pupils 3 mm bilaterally equal reactive to light, no meningeal signs   Resolved Hospital Problem list     Assessment & Plan:  NHR with altered mental status, hypertension , found to be hyponatremic and then had a seizure requiring intubation for airway protection Differential diagnosis includes PR ES and HSV encephalitis less likely  Acute respiratory failure  -Chest x-ray independently reviewed, suggestive of pulmonary edema, ET tube in position -PRVC ventilation -Check ABG in 1 hour  Acute encephalopathy Head CT negative -LP and MRI being planned by neurology -Empiric acyclovir  Hypertensive crisis -Chronic hypotension on midodrine We will add Cardene if blood pressure does not lower with propofol alone -Check troponin for completion  ESRD on HD, missed HD on 3/3 Hyperkalemia -  Discussed with renal, emergent HD today    Best practice (evaluated daily)  Diet: npo Pain/Anxiety/Delirium protocol (if indicated): Propofol /Fent , goal RASS-1 VAP protocol (if indicated): Y DVT prophylaxis: SQ heparin GI prophylaxis: protonix Glucose control: SSI Mobility: Bed rest Disposition:ICU  Goals of Care:  Last date of multidisciplinary goals of care discussion:  Code Status: Full  Labs   CBC: Recent Labs  Lab 05/20/2020 0700 06/01/2020 0845  WBC 15.2*  --   NEUTROABS 13.4*  --    HGB 10.5* 10.5*  HCT 30.5* 31.0*  MCV 89.4  --   PLT 310  --     Basic Metabolic Panel: Recent Labs  Lab 05/23/2020 0700 05/24/2020 0845  NA 119* 118*  K 5.5* 5.2*  CL 83*  --   CO2 20*  --   GLUCOSE 113*  --   BUN 85*  --   CREATININE 10.02*  --   CALCIUM 8.3*  --    GFR: Estimated Creatinine Clearance: 5 mL/min (A) (by C-G formula based on SCr of 10.02 mg/dL (H)). Recent Labs  Lab 06/07/2020 0700  WBC 15.2*    Liver Function Tests: Recent Labs  Lab 05/20/2020 0700  AST 13*  ALT 10  ALKPHOS 94  BILITOT 0.7  PROT 6.5  ALBUMIN 3.3*   No results for input(s): LIPASE, AMYLASE in the last 168 hours. Recent Labs  Lab 06/08/2020 1022  AMMONIA 16    ABG    Component Value Date/Time   PHART 7.434 05/25/2020 0845   PCO2ART 34.2 05/23/2020 0845   PO2ART 83 05/16/2020 0845   HCO3 22.9 06/06/2020 0845   TCO2 24 05/28/2020 0845   ACIDBASEDEF 1.0 05/16/2020 0845   O2SAT 97.0 06/01/2020 0845     Coagulation Profile: No results for input(s): INR, PROTIME in the last 168 hours.  Cardiac Enzymes: No results for input(s): CKTOTAL, CKMB, CKMBINDEX, TROPONINI in the last 168 hours.  HbA1C: No results found for: HGBA1C  CBG: No results for input(s): GLUCAP in the last 168 hours.  Review of Systems:   Unable to obtain since intubated and sedated  Past Medical History:  She,  has a past medical history of Bilateral hydronephrosis (2006), Cataract, Cervical cancer ([place]) (2006), Chronic kidney disease, Depression, ESRD on hemodialysis ([place]), Hypothyroidism, Insomnia, Iron deficiency anemia, Peripheral vascular disease ([place]), Secondary hyperparathyroidism (of renal origin), Transfusion history, and Wears glasses.   Surgical History:   Past Surgical History:  Procedure Laterality Date  . AV FISTULA PLACEMENT  03/05/2011   Procedure: INSERTION OF ARTERIOVENOUS (AV) GORE-TEX GRAFT ARM;  Surgeon: Angelia Mould, MD;  Location: [place];  Service: Vascular;  Laterality:  Left;  start 1115   finish 1250  . BIOPSY  01/29/2020   Procedure: BIOPSY;  Surgeon: Wilford Corner, MD;  Location: [facility] ENDOSCOPY;  Service: Gastroenterology;;  . COLONOSCOPY N/A 01/29/2020   Procedure: COLONOSCOPY;  Surgeon: Wilford Corner, MD;  Location: [place];  Service: Gastroenterology;  Laterality: N/A;  . ESOPHAGOGASTRODUODENOSCOPY N/A 01/29/2020   Procedure: ESOPHAGOGASTRODUODENOSCOPY (EGD);  Surgeon: Wilford Corner, MD;  Location: [place];  Service: Gastroenterology;  Laterality: N/A;  . INSERTION OF DIALYSIS CATHETER  03/05/2011   Procedure: INSERTION OF DIALYSIS CATHETER;  Surgeon: Angelia Mould, MD;  Location: [place];  Service: Vascular;  Laterality: Right;  start 1041- finish 1051  . IR FLUORO GUIDE CV LINE RIGHT  05/24/2019  . TUBAL LIGATION    . UPPER EXTREMITY VENOGRAPHY Bilateral 09/25/2019   Procedure: UPPER EXTREMITY VENOGRAPHY;  Surgeon: Waynetta Sandy, MD;  Location: [facility];  Service: Cardiovascular;  Laterality: Bilateral;  Bilateral   . URETER SURGERY     stent, Dr. Risa Grill for bilateral hydro     Social History:   reports that she has never smoked. She has never used smokeless tobacco. She reports current alcohol use. She reports that she does not use drugs.   Family History:  Her family history includes Cancer in her father and mother; Diabetes in her brother; Heart disease in her brother; Hyperlipidemia in her brother; Hypertension in her brother; Lung cancer in her father; Other in her brother.   Allergies Allergies  Allergen Reactions  . Prednisone     Other reaction(s): severe muscle weakness in legs  . Prozac [Fluoxetine Hcl] Hives     Home Medications  Prior to Admission medications   Medication Sig Start Date End Date Taking? Authorizing Provider  acetaminophen (TYLENOL) 500 MG tablet Take 1,000 mg by mouth every 8 (eight) hours as needed for moderate pain or headache.    Yes [provider]  b  complex vitamins tablet Take 1 tablet by mouth daily. (0900)   Yes [provider]  calcium acetate (PHOSLO) 667 MG capsule Take 667 mg by mouth 3 (three) times daily with meals. 06/18/11  Yes [provider]  cinacalcet (SENSIPAR) 90 MG tablet Take 90 mg by mouth at bedtime.    Yes [provider]  ferric citrate (AURYXIA) 1 GM 210 MG(Fe) tablet Take 1 tablet (210 mg total) by mouth 2 (two) times daily with a meal. Take 2 tablets (420mg  totally) by mouth three times daily with meals and snacks Patient taking differently: Take 210 mg by mouth 3 (three) times daily with meals. 01/30/20  Yes Domenic Polite, MD  gabapentin (NEURONTIN) 100 MG capsule Take 100 mg by mouth in the morning and at bedtime. (0800 & 1700)   Yes [provider]  midodrine (PROAMATINE) 10 MG tablet Take 10 mg by mouth See admin instructions. Take 10 mg by mouth twice on dialysis days (Tuesdays, Thursdays, Saturdays) at 0400 & 0800 09/11/18  Yes [provider]  Pollen Extracts (PROSTAT PO) Take 30 mLs by mouth daily.   Yes [provider]  PROVENTIL HFA 108 (90 Base) MCG/ACT inhaler Inhale 2 puffs into the lungs every 6 (six) hours as needed for wheezing or shortness of breath.  04/11/19  Yes [provider]  sertraline (ZOLOFT) 100 MG tablet Take 100 mg by mouth in the morning and at bedtime.  09/17/18  Yes [provider]  vitamin B-12 (CYANOCOBALAMIN) 1000 MCG tablet Take 2,000 mcg by mouth daily. (0900)   Yes [provider]     Critical care time: 49m    Kara Mead MD. FCCP. [facility] Pager : [phone]  If no response to pager , please call [phone] until 7 pm After 7:00 pm call Elink  [phone]   05/25/2020

## 2020-05-18 NOTE — ED Triage Notes (Signed)
Patient presents from [facility] with chief complaint of altered mental status. Facility staff reports she saw her at her baseline around 0400. Patient responsive to pain, does not follow commands. Patient has dialysis cath to RT chest. T/Th/Sat, last treatment Tue, refused Thursday. Patient moving upper extremities intermittently during triage.

## 2020-05-18 NOTE — Progress Notes (Signed)
Pt transported from [place] to [facility] without complications.

## 2020-05-18 NOTE — ED Provider Notes (Signed)
[facility] EMERGENCY DEPARTMENT Provider Note   CSN: [id number] Arrival date & time: 05/26/2020  9767     History Chief Complaint  Patient presents with  . Altered Mental Status    Mary Wilcox is a 65 y.o. female with a past medical history of ESRD on dialysis TTSa, PVD, hypothyroidism presenting to the ED with a chief complaint of altered mental status.  Patient last known normal when she went to sleep last night. Seen altered at 4am this morning. Last dialysis session was 05/14/2020, she refused dialysis on 05/16/2020 and is due for dialysis this morning.  HPI     Past Medical History:  Diagnosis Date  . Bilateral hydronephrosis 2006   due to obstruction from cervical cancer  . Cataract    right eye  . Cervical cancer ([place]) 2006   IIIB s/p ext radiation and intracavitary cessium  . Chronic kidney disease    hx several episodes of ARF secondary to obstructive uropathy  . Depression   . ESRD on hemodialysis ([place])    [facility], TTS HD  . Hypothyroidism   . Insomnia    takes Ambien 3x wk  . Iron deficiency anemia   . Peripheral vascular disease ([place])   . Secondary hyperparathyroidism (of renal origin)   . Transfusion history    02/2011 for Hgb 6.8  . Wears glasses     Patient Active Problem List   Diagnosis Date Noted  . Seizure ([place]) 05/27/2020  . Hyperkalemia, diminished renal excretion 01/22/2020  . High risk social situation 01/22/2020  . Anemia 01/22/2020  . Class 2 obesity due to excess calories with body mass index (BMI) of 35.0 to 35.9 in adult 01/22/2020  . Hypothyroidism   . Pressure injury of skin 04/04/2019  . Generalized weakness 02/11/2019  . Hyponatremia 02/11/2019  . Hyperkalemia 09/28/2018  . Bradycardia   . Mechanical complication of other vascular device, implant, and graft 08/19/2011  . ESRD (end stage renal disease) ([place]) 08/05/2011  . Other complications due to renal dialysis device, implant, and graft  08/05/2011  . Secondary hyperparathyroidism (of renal origin)   . Anemia associated with chronic renal failure 03/06/2011  . Thrombocytosis 03/06/2011  . Chronic kidney disease, stage V requiring chronic dialysis ([place]) 03/05/2011  . High anion gap metabolic acidosis 34/19/3790  . Hydronephrosis, bilateral 03/05/2011  . Chronic UTI 03/05/2011  . Leukocytosis 03/05/2011    Past Surgical History:  Procedure Laterality Date  . AV FISTULA PLACEMENT  03/05/2011   Procedure: INSERTION OF ARTERIOVENOUS (AV) GORE-TEX GRAFT ARM;  Surgeon: Angelia Mould, MD;  Location: [facility];  Service: Vascular;  Laterality: Left;  start 1115   finish 1250  . BIOPSY  01/29/2020   Procedure: BIOPSY;  Surgeon: Wilford Corner, MD;  Location: [facility] ENDOSCOPY;  Service: Gastroenterology;;  . COLONOSCOPY N/A 01/29/2020   Procedure: COLONOSCOPY;  Surgeon: Wilford Corner, MD;  Location: [place];  Service: Gastroenterology;  Laterality: N/A;  . ESOPHAGOGASTRODUODENOSCOPY N/A 01/29/2020   Procedure: ESOPHAGOGASTRODUODENOSCOPY (EGD);  Surgeon: Wilford Corner, MD;  Location: [place];  Service: Gastroenterology;  Laterality: N/A;  . INSERTION OF DIALYSIS CATHETER  03/05/2011   Procedure: INSERTION OF DIALYSIS CATHETER;  Surgeon: Angelia Mould, MD;  Location: [place];  Service: Vascular;  Laterality: Right;  start 1041- finish 1051  . IR FLUORO GUIDE CV LINE RIGHT  05/24/2019  . TUBAL LIGATION    . UPPER EXTREMITY VENOGRAPHY Bilateral 09/25/2019   Procedure: UPPER EXTREMITY VENOGRAPHY;  Surgeon:  Waynetta Sandy, MD;  Location: [facility] CV LAB;  Service: Cardiovascular;  Laterality: Bilateral;  Bilateral   . URETER SURGERY     stent, Dr. Risa Grill for bilateral hydro     OB History   No obstetric history on file.     Family History  Problem Relation Age of Onset  . Lung cancer Father   . Cancer Father   . Cancer Mother        kidney and thyroid  . Heart disease Brother   .  Diabetes Brother   . Hyperlipidemia Brother   . Hypertension Brother   . Other Brother        varicose vein    Social History   Tobacco Use  . Smoking status: Never Smoker  . Smokeless tobacco: Never Used  Vaping Use  . Vaping Use: Never used  Substance Use Topics  . Alcohol use: Yes    Comment: rare glass of wine  . Drug use: No    Home Medications Prior to Admission medications   Medication Sig Start Date End Date Taking? Authorizing Provider  acetaminophen (TYLENOL) 500 MG tablet Take 1,000 mg by mouth every 8 (eight) hours as needed for moderate pain or headache.    Yes [provider]  b complex vitamins tablet Take 1 tablet by mouth daily. (0900)   Yes [provider]  calcium acetate (PHOSLO) 667 MG capsule Take 667 mg by mouth 3 (three) times daily with meals. 06/18/11  Yes [provider]  cinacalcet (SENSIPAR) 90 MG tablet Take 90 mg by mouth at bedtime.    Yes [provider]  ferric citrate (AURYXIA) 1 GM 210 MG(Fe) tablet Take 1 tablet (210 mg total) by mouth 2 (two) times daily with a meal. Take 2 tablets (420mg  totally) by mouth three times daily with meals and snacks Patient taking differently: Take 210 mg by mouth 3 (three) times daily with meals. 01/30/20  Yes Domenic Polite, MD  gabapentin (NEURONTIN) 100 MG capsule Take 100 mg by mouth in the morning and at bedtime. (0800 & 1700)   Yes [provider]  midodrine (PROAMATINE) 10 MG tablet Take 10 mg by mouth See admin instructions. Take 10 mg by mouth twice on dialysis days (Tuesdays, Thursdays, Saturdays) at 0400 & 0800 09/11/18  Yes [provider]  Pollen Extracts (PROSTAT PO) Take 30 mLs by mouth daily.   Yes [provider]  PROVENTIL HFA 108 (90 Base) MCG/ACT inhaler Inhale 2 puffs into the lungs every 6 (six) hours as needed for wheezing or shortness of breath.  04/11/19  Yes [provider]  sertraline (ZOLOFT) 100 MG tablet Take 100 mg by  mouth in the morning and at bedtime.  09/17/18  Yes [provider]  vitamin B-12 (CYANOCOBALAMIN) 1000 MCG tablet Take 2,000 mcg by mouth daily. (0900)   Yes [provider]    Allergies    Prednisone and Prozac [fluoxetine hcl]  Review of Systems   Review of Systems  Unable to perform ROS: Mental status change    Physical Exam Updated Vital Signs BP (!) 154/68   Pulse (!) 104   Temp 98.2 F (36.8 C) (Oral)   Resp (!) 31   Ht 4\' 11"  (1.499 m)   Wt 79.8 kg   SpO2 92%   BMI 35.55 kg/m   Physical Exam Vitals and nursing note reviewed.  Constitutional:      General: She is not in acute distress.  Appearance: She is well-developed and well-nourished. She is obese.     Comments: Responsive to sternal rub. Intermittent grunting; sonorous breathing.  HENT:     Head: Normocephalic and atraumatic.     Nose: Nose normal.  Eyes:     General: No scleral icterus.       Right eye: No discharge.        Left eye: No discharge.     Extraocular Movements: EOM normal.     Conjunctiva/sclera: Conjunctivae normal.     Pupils: Pupils are equal, round, and reactive to light.  Cardiovascular:     Rate and Rhythm: Normal rate and regular rhythm.     Pulses: Intact distal pulses.     Heart sounds: Normal heart sounds. No murmur heard. No friction rub. No gallop.   Pulmonary:     Effort: Pulmonary effort is normal. No respiratory distress.     Breath sounds: Normal breath sounds.  Abdominal:     General: Bowel sounds are normal. There is no distension.     Palpations: Abdomen is soft.     Tenderness: There is no abdominal tenderness. There is no guarding.  Musculoskeletal:        General: No edema. Normal range of motion.     Cervical back: Normal range of motion and neck supple.  Skin:    General: Skin is warm and dry.     Findings: No rash.  Neurological:     Mental Status: She is alert.     Motor: No abnormal muscle tone.     Coordination: Coordination normal.      Comments: Appears to be moving extremities spontaneously.  Psychiatric:        Mood and Affect: Mood and affect normal.     ED Results / Procedures / Treatments   Labs (all labs ordered are listed, but only abnormal results are displayed) Labs Reviewed  COMPREHENSIVE METABOLIC PANEL - Abnormal; Notable for the following components:      Result Value   Sodium 119 (*)    Potassium 5.5 (*)    Chloride 83 (*)    CO2 20 (*)    Glucose, Bld 113 (*)    BUN 85 (*)    Creatinine, Ser 10.02 (*)    Calcium 8.3 (*)    Albumin 3.3 (*)    AST 13 (*)    GFR, Estimated 4 (*)    Anion gap 16 (*)    All other components within normal limits  CBC WITH DIFFERENTIAL/PLATELET - Abnormal; Notable for the following components:   WBC 15.2 (*)    RBC 3.41 (*)    Hemoglobin 10.5 (*)    HCT 30.5 (*)    Neutro Abs 13.4 (*)    Abs Immature Granulocytes 0.10 (*)    All other components within normal limits  TSH - Abnormal; Notable for the following components:   TSH 4.554 (*)    All other components within normal limits  COMPREHENSIVE METABOLIC PANEL - Abnormal; Notable for the following components:   Sodium 118 (*)    Potassium 5.8 (*)    Chloride 82 (*)    CO2 19 (*)    Glucose, Bld 234 (*)    BUN 90 (*)    Creatinine, Ser 10.53 (*)    Calcium 8.2 (*)    Albumin 3.2 (*)    GFR, Estimated 4 (*)    Anion gap 17 (*)    All other components within normal limits  MAGNESIUM -  Abnormal; Notable for the following components:   Magnesium 3.0 (*)    All other components within normal limits  PHOSPHORUS - Abnormal; Notable for the following components:   Phosphorus 6.0 (*)    All other components within normal limits  CBC - Abnormal; Notable for the following components:   WBC 24.2 (*)    RBC 3.24 (*)    Hemoglobin 10.2 (*)    HCT 29.2 (*)    Platelets 420 (*)    All other components within normal limits  I-STAT ARTERIAL BLOOD GAS, ED - Abnormal; Notable for the following components:    Sodium 118 (*)    Potassium 5.2 (*)    Calcium, Ion 1.04 (*)    HCT 31.0 (*)    Hemoglobin 10.5 (*)    All other components within normal limits  RESP PANEL BY RT-PCR (FLU A&B, COVID) ARPGX2  URINE CULTURE  CSF CULTURE W GRAM STAIN  GRAM STAIN  HSV CULTURE AND TYPING  AMMONIA  CORTISOL  PROTIME-INR  APTT  AMYLASE  LIPASE, BLOOD  URINALYSIS, COMPLETE (UACMP) WITH MICROSCOPIC  CSF CELL COUNT WITH DIFFERENTIAL  CSF CELL COUNT WITH DIFFERENTIAL  GLUCOSE, CSF  PROTEIN, CSF  RAPID URINE DRUG SCREEN, HOSP PERFORMED  HIV ANTIBODY (ROUTINE TESTING W REFLEX)  PROCALCITONIN  BLOOD GAS, ARTERIAL  CBG MONITORING, ED    EKG EKG Interpretation  Date/Time:  Saturday May 18 2020 06:58:50 EST Ventricular Rate:  89 PR Interval:    QRS Duration: 84 QT Interval:  375 QTC Calculation: 457 R Axis:   6 Text Interpretation: Sinus rhythm When compared to prior, similar apperance. No sTEMI Confirmed by Antony Blackbird [phone]) on 06/13/2020 7:31:43 AM   Radiology CT Angio Head W or Wo Contrast  Result Date: 05/16/2020 CLINICAL DATA:  Neuro deficit with stroke suspected. Altered mental status EXAM: CT ANGIOGRAPHY HEAD AND NECK TECHNIQUE: Multidetector CT imaging of the head and neck was performed using the standard protocol during bolus administration of intravenous contrast. Multiplanar CT image reconstructions and MIPs were obtained to evaluate the vascular anatomy. Carotid stenosis measurements (when applicable) are obtained utilizing NASCET criteria, using the distal internal carotid diameter as the denominator. CONTRAST:  70mL OMNIPAQUE IOHEXOL 350 MG/ML SOLN COMPARISON:  None. FINDINGS: CT HEAD FINDINGS Brain: No evidence of acute infarction, hemorrhage, hydrocephalus, extra-axial collection or mass lesion/mass effect. Central predominant cerebral volume loss. Vascular: See below Skull: Normal. Negative for fracture or focal lesion. Sinuses: Negative Orbits: Negative Review of the MIP images  confirms the above findings CTA NECK FINDINGS Aortic arch: Negative 3 vessel arch. Right carotid system: No evidence of dissection, stenosis (50% or greater) or occlusion. No notable atheromatous changes. Left carotid system: No evidence of dissection, stenosis (50% or greater) or occlusion. No notable atheromatous changes Vertebral arteries: Proximal subclavian atherosclerosis. Skeleton: Generally coarsened trabeculae, suspected renal osteodystrophy. No acute finding. Other neck: Negative Upper chest: Extensive left sided venous stenting. The left upper extremity was injected with no enhancement of the left axillary or subclavian veins at the level of stenting, but with well-formed collaterals. Right-sided dialysis catheter in place. Small layering pleural effusions. Patchy bilateral pulmonary opacity with interlobular septal thickening, overall favor edema in this patient no missed recent dialysis. Review of the MIP images confirms the above findings CTA HEAD FINDINGS Anterior circulation: Atheromatous calcification along the carotid siphons. No branch occlusion, beading, or aneurysm. Posterior circulation: Vertebral and basilar arteries are smooth and diffusely patent. Fetal type right PCA. No branch occlusion, beading, or aneurysm.  Venous sinuses: Diffusely patent Anatomic variants: As above Review of the MIP images confirms the above findings IMPRESSION: 1. No emergent intracranial or vascular finding. 2. Stented left subclavian and axillary veins with stent occlusion and well-formed collaterals. 3. Pulmonary edema/small pleural effusions. Electronically Signed   By: Monte Fantasia M.D.   On: 05/23/2020 08:38   CT Angio Neck W and/or Wo Contrast  Result Date: 05/22/2020 CLINICAL DATA:  Neuro deficit with stroke suspected. Altered mental status EXAM: CT ANGIOGRAPHY HEAD AND NECK TECHNIQUE: Multidetector CT imaging of the head and neck was performed using the standard protocol during bolus administration of  intravenous contrast. Multiplanar CT image reconstructions and MIPs were obtained to evaluate the vascular anatomy. Carotid stenosis measurements (when applicable) are obtained utilizing NASCET criteria, using the distal internal carotid diameter as the denominator. CONTRAST:  3mL OMNIPAQUE IOHEXOL 350 MG/ML SOLN COMPARISON:  None. FINDINGS: CT HEAD FINDINGS Brain: No evidence of acute infarction, hemorrhage, hydrocephalus, extra-axial collection or mass lesion/mass effect. Central predominant cerebral volume loss. Vascular: See below Skull: Normal. Negative for fracture or focal lesion. Sinuses: Negative Orbits: Negative Review of the MIP images confirms the above findings CTA NECK FINDINGS Aortic arch: Negative 3 vessel arch. Right carotid system: No evidence of dissection, stenosis (50% or greater) or occlusion. No notable atheromatous changes. Left carotid system: No evidence of dissection, stenosis (50% or greater) or occlusion. No notable atheromatous changes Vertebral arteries: Proximal subclavian atherosclerosis. Skeleton: Generally coarsened trabeculae, suspected renal osteodystrophy. No acute finding. Other neck: Negative Upper chest: Extensive left sided venous stenting. The left upper extremity was injected with no enhancement of the left axillary or subclavian veins at the level of stenting, but with well-formed collaterals. Right-sided dialysis catheter in place. Small layering pleural effusions. Patchy bilateral pulmonary opacity with interlobular septal thickening, overall favor edema in this patient no missed recent dialysis. Review of the MIP images confirms the above findings CTA HEAD FINDINGS Anterior circulation: Atheromatous calcification along the carotid siphons. No branch occlusion, beading, or aneurysm. Posterior circulation: Vertebral and basilar arteries are smooth and diffusely patent. Fetal type right PCA. No branch occlusion, beading, or aneurysm. Venous sinuses: Diffusely patent  Anatomic variants: As above Review of the MIP images confirms the above findings IMPRESSION: 1. No emergent intracranial or vascular finding. 2. Stented left subclavian and axillary veins with stent occlusion and well-formed collaterals. 3. Pulmonary edema/small pleural effusions. Electronically Signed   By: Monte Fantasia M.D.   On: 05/14/2020 08:38   DG Chest Portable 1 View  Result Date: 05/30/2020 CLINICAL DATA:  Status post intubation.  Altered mental status. EXAM: PORTABLE CHEST 1 VIEW COMPARISON:  Single-view of the chest earlier today. FINDINGS: New endotracheal tube is in good position with the tip just below the clavicular heads, 4 cm above the carina. Dialysis catheter is unchanged. Aeration is much worse than on the prior examination with hazy opacities in the upper lung zones and bases now present. No pneumothorax. Heart size is normal. IMPRESSION: ETT in good position. New hazy bilateral pulmonary opacities may be due to pulmonary edema. Electronically Signed   By: Inge Rise M.D.   On: 05/15/2020 12:45   DG Chest Portable 1 View  Result Date: 05/27/2020 CLINICAL DATA:  Altered mental status EXAM: PORTABLE CHEST 1 VIEW COMPARISON:  01/22/2020 FINDINGS: Normal heart size for technique. Dialysis catheter on the right with tip at the upper right atrium. Extensive venous stenting on the left. Mild interstitial prominence without Kerley lines or effusion. No focal consolidation.  No pneumothorax. IMPRESSION: Low volume chest with borderline vascular congestion. Electronically Signed   By: Monte Fantasia M.D.   On: 05/28/2020 08:20    Procedures Procedure Name: Intubation Date/Time: 06/13/2020 12:29 PM Performed by: Delia Heady, PA-C Pre-anesthesia Checklist: Patient identified, Emergency Drugs available, Suction available, Patient being monitored and Timeout performed Oxygen Delivery Method: Non-rebreather mask Preoxygenation: Pre-oxygenation with 100% oxygen Induction Type: Rapid  sequence Ventilation: Mask ventilation without difficulty Laryngoscope Size: Glidescope Tube size: 7.5 mm Number of attempts: 1 Placement Confirmation: ETT inserted through vocal cords under direct vision,  Positive ETCO2,  Breath sounds checked- equal and bilateral and CO2 detector Tube secured with: ETT holder Dental Injury: Teeth and Oropharynx as per pre-operative assessment     .Critical Care Performed by: Delia Heady, PA-C Authorized by: Delia Heady, PA-C   Critical care provider statement:    Critical care time (minutes):  60   Critical care time was exclusive of:  Separately billable procedures and treating other patients   Critical care was necessary to treat or prevent imminent or life-threatening deterioration of the following conditions:  Cardiac failure, circulatory failure, respiratory failure, renal failure, sepsis, shock and CNS failure or compromise   Critical care was time spent personally by me on the following activities:  Development of treatment plan with patient or surrogate, discussions with consultants, evaluation of patient's response to treatment, examination of patient, ordering and performing treatments and interventions, ordering and review of laboratory studies, ordering and review of radiographic studies, pulse oximetry, re-evaluation of patient's condition and review of old charts   I assumed direction of critical care for this patient from another provider in my specialty: no       Medications Ordered in ED Medications  LORazepam (ATIVAN) 2 MG/ML injection (  Not Given 06/01/2020 1214)  Darbepoetin Alfa (ARANESP) injection 60 mcg (has no administration in time range)  docusate sodium (COLACE) capsule 100 mg (has no administration in time range)  polyethylene glycol (MIRALAX / GLYCOLAX) packet 17 g (has no administration in time range)  heparin injection 5,000 Units (has no administration in time range)  0.9 %  sodium chloride infusion ( Intravenous New  Bag/Given 06/06/2020 1405)  docusate (COLACE) 50 MG/5ML liquid 100 mg (has no administration in time range)  polyethylene glycol (MIRALAX / GLYCOLAX) packet 17 g (has no administration in time range)  fentaNYL (SUBLIMAZE) injection 50 mcg (has no administration in time range)  fentaNYL (SUBLIMAZE) injection 50-200 mcg (100 mcg Intravenous Given 05/24/2020 1443)  propofol (DIPRIVAN) 1000 MG/100ML infusion (45 mcg/kg/min  79.8 kg Intravenous New Bag/Given 05/28/2020 1353)  pantoprazole sodium (PROTONIX) 40 mg/20 mL oral suspension 40 mg (has no administration in time range)  levETIRAcetam (KEPPRA) IVPB 500 mg/100 mL premix (has no administration in time range)  acyclovir (ZOVIRAX) 400 mg in dextrose 5 % 100 mL IVPB (has no administration in time range)  iohexol (OMNIPAQUE) 350 MG/ML injection 60 mL (60 mLs Intravenous Contrast Given 05/26/2020 0824)  sodium chloride 0.9 % bolus 500 mL (0 mLs Intravenous Stopped 06/03/2020 1025)  levETIRAcetam (KEPPRA) IVPB 1500 mg/ 100 mL premix (0 mg Intravenous Stopped 05/19/2020 1222)  etomidate (AMIDATE) injection (20 mg Intravenous Given 05/30/2020 1203)  rocuronium (ZEMURON) injection (100 mg Intravenous Given 06/08/2020 1204)    ED Course  I have reviewed the triage vital signs and the nursing notes.  Pertinent labs & imaging results that were available during my care of the patient were reviewed by me and considered in my medical decision making (  see chart for details).  Clinical Course as of 05/24/2020 1503  Sat May 18, 2020  0727 Attempted to call facility without success. [HK]  [id number] I have consulted Dr. Leonel Ramsay, on-call neurologist who recommends CT of the head, CT angio of the head and neck as well as lab work. [HK]  0810 Sodium(!!): 119 [HK]  0905 CT Angio Head W or Wo Contrast No acute findings. [HK]  [phone] Attempted to call daughter, [name] over the phone for an update without success. [HK]  0954 TSH(!): 4.554 [HK]  1153 Patient with seizure like activity at the  bedside. EEG leads in place with neurology at the bedside. Plan to intubate at this time as patient sonorous breathing. [HK]  1231 Potassium(!): 5.5 [HK]  1231 BP(!): 258/130 [HK]  1231 PCCM to see. [HK]  7048 Post intubation film showing ET tube in good position [HK]  1348 Will attempt LP at bedside. [HK]  1349 BP(!): 170/86 On propofol. [HK]  1412 BP(!): 152/90 [HK]    Clinical Course User Index [HK] Delia Heady, PA-C   MDM Rules/Calculators/A&P                          65 year old female with past medical history of ESRD on dialysis presenting to the ED for altered mental status from nursing facility.  Last known normal was last night when she went to bed but noticed at 4 AM this morning that she was altered.  On my exam patient unwilling to follow commands.  Does seem to be moving extremities but only responsive to painful stimuli.  She is hypertensive to 889 systolic.  Patient missed dialysis 2 days ago and is scheduled to have dialysis today.  She is tachypneic.  Minimally responsive with sonorous respirations.  Concern for stroke versus seizure.  Spoke to neurologist who evaluated patient at the bedside, recommend CT angio of the head and neck.  Work-up significant for hyponatremia of 119 which could contribute to her symptoms today.  Work-up also shows hyperkalemia of 5.5 which is likely in the setting of her missed dialysis session.  Patient with seizure while on EEG leads.  She was started on Keppra.  Decision was made to emergently intubate her due to her seizure and decreased mental status.  Propofol with improvement in her blood pressure.  Patient evaluated by critical care at the bedside who will admit.  Concern for possible HSV encephalitis due to blister found in mouth.  LP done by neurology.  With improvement in tachycardia and hypertension here.  Patient evaluated by nephrology at the bedside and will require dialysis today as well.  She is pending an MRI which will be followed up  by admitting team critical care.  Unfortunately I was not able to get in touch with family members.    Portions of this note were generated with [employer]. Dictation errors may occur despite best attempts at proofreading.  Final Clinical Impression(s) / ED Diagnoses Final diagnoses:  Hyponatremia  Somnolence  Seizure ([place])  ESRD on dialysis [facility])  Hyperkalemia  Hypertensive emergency    Rx / DC Orders ED Discharge Orders    None       Delia Heady, PA-C 06/07/2020 1519    Tegeler, Gwenyth Allegra, MD 05/20/20 1623

## 2020-05-18 NOTE — Progress Notes (Signed)
Patient transported from ED to room 7O67 without complications.

## 2020-05-18 NOTE — ED Notes (Signed)
At 1141 EEG tech called out to say pt was seizing. Upon entry to room, pt was seizing and quickly went postictal. Providers notified and at bedside. Suctioning performed, O2 applied. Neurology at bedside. Seizure lasted from [phone]

## 2020-05-18 NOTE — ED Notes (Signed)
Notified admitting about possible need for increased or more sedation d/t pt breathing over the vent and tachpyneic. Waiting for response

## 2020-05-18 NOTE — Procedures (Signed)
History: 65 year old female being evaluated for altered mental status  Sedation: None  Technique: This is a 21 channel routine scalp EEG performed at the bedside with bipolar and monopolar montages arranged in accordance to the international 10/20 system of electrode placement. One channel was dedicated to EKG recording.    Background: At the onset of recording, the background consists of generalized irregular delta and theta range activities, obscured by some muscle artifact.  There are occasional poorly formed generalized discharges with triphasic morphology.  There is a seizure with poorly localized, but appears to be left frontal, with evolution in amplitude, field, and frequency.  This is initially subclinical, part way through, her eyes open with slight rightward looking, though no forced deviation followed by deviation towards the right.  She then had versive head turning towards the right with progression to tonic-clonic seizure.  The first 3 minutes and 30 seconds of the seizure or subclinical with a total duration of 4 minutes and 32 seconds.  There is significant postictal slowing.  Photic stimulation: Physiologic driving is now performed  EEG Abnormalities: 1) 4-minute and 30 seconds long seizure, partial by semiology, likely left frontal in origin  Clinical Interpretation: This EEG recorded a partial seizure likely left frontal in origin as described above.  The majority of the seizure was actually subclinical in nature, followed by secondary generalization with convulsion.  Roland Rack, MD [facility] [phone]  If 7pm- 7am, please page neurology on call as listed in [place].

## 2020-05-18 NOTE — ED Notes (Signed)
IV team at bedside 

## 2020-05-18 NOTE — Progress Notes (Signed)
LTM EEG with MRI  Leads continue, result pending

## 2020-05-18 NOTE — Progress Notes (Addendum)
eLink Physician-Brief Progress Note Patient Name: Mary Wilcox DOB: [birth year] MRN: [id number]   Date of Service  06/02/2020  HPI/Events of Note  Hypotension. On propofol. Propofol lowered down but also having issues with seizures. Also getting HD now. RN is contacting neurology for the seizures. In the meantime, to allow titration of propofol and facilitate HD, will write for one dose of albumin and peripheral levophed. Current recycled SBP is 90 but RN had to lower propofol to 20 mic. HR is in 90s. O2 sat mid 90s on 50% fio2 on vent.   eICU Interventions  Patient has 2 peripheral IV lines and her HD cath has 2 ports only. If unable to have improved BP with above interventions, she will need a CVC. Discussed with RN      Intervention Category Major Interventions: Hypotension - evaluation and management  [name] G [name] 06/03/2020, 8:55 PM   9:45 pm  Very poor IV access, RN unable to infuse levophed and check BP due to issues Notified ground team Dr Milon Dikes to assess for central line

## 2020-05-18 NOTE — Consult Note (Addendum)
[facility] Renal Consultation Note  Indication for Consultation:  Management of ESRD/hemodialysis; anemia, hypertension/volume and secondary hyperparathyroidism  HPI: Mary Wilcox is a 65 y.o. female ESRD d/t obstructive uropathy (from cervical cancer) - started HD 02/2011.  Chronic HD TTS([facility]) with history of noncompliance with attending treatments.  Also H/O Hx Stage IIIB cervical cancer, s/p radiation and chemo (2006), chronic hypotension, hypothyroidism .   She is a resident of [facility] noted refused/missed hemodialysis on Thursday 3/03, at [facility] noted altered mental status/somnolence this a.m.  brought to the ER. Documented while  having EEG, EEG tech called out to say patient was seizing in the ER, patient was intubated for secure airway .  ER labs noted NA 118, K5.2,, BUN 85, CR 10.02, CO2 20, glucose 113, WBC 15.2, Hgb 10.5 HTN  in ER 242/130 CXR= new hazy bilateral pulmonary opacities consistent with pulmonary edema CT scan head and CT angio neck no emergent intracranial vascular findings, stented left subclavian axillary veins with stent occlusion well-formed collaterals, pulmonary edema/small pleural effusions  COVID swab still pending.  Evidently not done initially  On exam patient unable give history somnolent intubated. I saw patient before intubated-  Unresponsive         Past Medical History:  Diagnosis Date  . Bilateral hydronephrosis 2006   due to obstruction from cervical cancer  . Cataract    right eye  . Cervical cancer ([place]) 2006   IIIB s/p ext radiation and intracavitary cessium  . Chronic kidney disease    hx several episodes of ARF secondary to obstructive uropathy  . Depression   . ESRD on hemodialysis ([place])    [facility], TTS HD  . Hypothyroidism   . Insomnia    takes Ambien 3x wk  . Iron deficiency anemia   . Peripheral vascular disease ([place])   . Secondary hyperparathyroidism (of renal origin)    . Transfusion history    02/2011 for Hgb 6.8  . Wears glasses     Past Surgical History:  Procedure Laterality Date  . AV FISTULA PLACEMENT  03/05/2011   Procedure: INSERTION OF ARTERIOVENOUS (AV) GORE-TEX GRAFT ARM;  Surgeon: Angelia Mould, MD;  Location: [place];  Service: Vascular;  Laterality: Left;  start 1115   finish 1250  . BIOPSY  01/29/2020   Procedure: BIOPSY;  Surgeon: Wilford Corner, MD;  Location: [facility] ENDOSCOPY;  Service: Gastroenterology;;  . COLONOSCOPY N/A 01/29/2020   Procedure: COLONOSCOPY;  Surgeon: Wilford Corner, MD;  Location: [place];  Service: Gastroenterology;  Laterality: N/A;  . ESOPHAGOGASTRODUODENOSCOPY N/A 01/29/2020   Procedure: ESOPHAGOGASTRODUODENOSCOPY (EGD);  Surgeon: Wilford Corner, MD;  Location: [place];  Service: Gastroenterology;  Laterality: N/A;  . INSERTION OF DIALYSIS CATHETER  03/05/2011   Procedure: INSERTION OF DIALYSIS CATHETER;  Surgeon: Angelia Mould, MD;  Location: [place];  Service: Vascular;  Laterality: Right;  start 1041- finish 1051  . IR FLUORO GUIDE CV LINE RIGHT  05/24/2019  . TUBAL LIGATION    . UPPER EXTREMITY VENOGRAPHY Bilateral 09/25/2019   Procedure: UPPER EXTREMITY VENOGRAPHY;  Surgeon: Waynetta Sandy, MD;  Location: [place] CV LAB;  Service: Cardiovascular;  Laterality: Bilateral;  Bilateral   . URETER SURGERY     stent, Dr. Risa Grill for bilateral hydro      Family History  Problem Relation Age of Onset  . Lung cancer Father   . Cancer Father   . Cancer Mother  kidney and thyroid  . Heart disease Brother   . Diabetes Brother   . Hyperlipidemia Brother   . Hypertension Brother   . Other Brother        varicose vein      reports that she has never smoked. She has never used smokeless tobacco. She reports current alcohol use. She reports that she does not use drugs.   Allergies  Allergen Reactions  . Prednisone     Other reaction(s): severe muscle weakness in  legs  . Prozac [Fluoxetine Hcl] Hives    Prior to Admission medications   Medication Sig Start Date End Date Taking? Authorizing Provider  acetaminophen (TYLENOL) 500 MG tablet Take 1,000 mg by mouth every 8 (eight) hours as needed for moderate pain or headache.    Yes [provider]  b complex vitamins tablet Take 1 tablet by mouth daily. (0900)   Yes [provider]  calcium acetate (PHOSLO) 667 MG capsule Take 667 mg by mouth 3 (three) times daily with meals. 06/18/11  Yes [provider]  cinacalcet (SENSIPAR) 90 MG tablet Take 90 mg by mouth at bedtime.    Yes [provider]  ferric citrate (AURYXIA) 1 GM 210 MG(Fe) tablet Take 1 tablet (210 mg total) by mouth 2 (two) times daily with a meal. Take 2 tablets (420mg  totally) by mouth three times daily with meals and snacks Patient taking differently: Take 210 mg by mouth 3 (three) times daily with meals. 01/30/20  Yes Domenic Polite, MD  gabapentin (NEURONTIN) 100 MG capsule Take 100 mg by mouth in the morning and at bedtime. (0800 & 1700)   Yes [provider]  midodrine (PROAMATINE) 10 MG tablet Take 10 mg by mouth See admin instructions. Take 10 mg by mouth twice on dialysis days (Tuesdays, Thursdays, Saturdays) at 0400 & 0800 09/11/18  Yes [provider]  Pollen Extracts (PROSTAT PO) Take 30 mLs by mouth daily.   Yes [provider]  PROVENTIL HFA 108 (90 Base) MCG/ACT inhaler Inhale 2 puffs into the lungs every 6 (six) hours as needed for wheezing or shortness of breath.  04/11/19  Yes [provider]  sertraline (ZOLOFT) 100 MG tablet Take 100 mg by mouth in the morning and at bedtime.  09/17/18  Yes [provider]  vitamin B-12 (CYANOCOBALAMIN) 1000 MCG tablet Take 2,000 mcg by mouth daily. (0900)   Yes [provider]    MBT:DHRCBU chloride, docusate sodium, fentaNYL (SUBLIMAZE) injection, fentaNYL (SUBLIMAZE) injection, polyethylene glycol, sodium  chloride flush  Results for orders placed or performed during the hospital encounter of 05/26/2020 (from the past 48 hour(s))  Comprehensive metabolic panel     Status: Abnormal   Collection Time: 06/03/2020  7:00 AM  Result Value Ref Range   Sodium 119 (LL) 135 - 145 mmol/L    Comment: CRITICAL RESULT CALLED TO, READ BACK BY AND VERIFIED WITH: K.MOON RN @ [phone] 05/24/2020 BY C.EDENS    Potassium 5.5 (H) 3.5 - 5.1 mmol/L   Chloride 83 (L) 98 - 111 mmol/L   CO2 20 (L) 22 - 32 mmol/L   Glucose, Bld 113 (H) 70 - 99 mg/dL    Comment: Glucose reference range applies only to samples taken after fasting for at least 8 hours.   BUN 85 (H) 8 - 23 mg/dL   Creatinine, Ser 10.02 (H) 0.44 - 1.00 mg/dL   Calcium 8.3 (L) 8.9 - 10.3 mg/dL   Total Protein 6.5 6.5 - 8.1  g/dL   Albumin 3.3 (L) 3.5 - 5.0 g/dL   AST 13 (L) 15 - 41 U/L   ALT 10 0 - 44 U/L   Alkaline Phosphatase 94 38 - 126 U/L   Total Bilirubin 0.7 0.3 - 1.2 mg/dL   GFR, Estimated 4 (L) >60 mL/min    Comment: (NOTE) Calculated using the CKD-EPI Creatinine Equation (2021)    Anion gap 16 (H) 5 - 15    Comment: Performed at [facility] [address] [place], [place] [address]  CBC WITH DIFFERENTIAL     Status: Abnormal   Collection Time: 05/25/2020  7:00 AM  Result Value Ref Range   WBC 15.2 (H) 4.0 - 10.5 K/uL   RBC 3.41 (L) 3.87 - 5.11 MIL/uL   Hemoglobin 10.5 (L) 12.0 - 15.0 g/dL   HCT 30.5 (L) 36.0 - 46.0 %   MCV 89.4 80.0 - 100.0 fL   MCH 30.8 26.0 - 34.0 pg   MCHC 34.4 30.0 - 36.0 g/dL   RDW 15.0 11.5 - 15.5 %   Platelets 310 150 - 400 K/uL   nRBC 0.0 0.0 - 0.2 %   Neutrophils Relative % 88 %   Neutro Abs 13.4 (H) 1.7 - 7.7 K/uL   Lymphocytes Relative 5 %   Lymphs Abs 0.8 0.7 - 4.0 K/uL   Monocytes Relative 5 %   Monocytes Absolute 0.8 0.1 - 1.0 K/uL   Eosinophils Relative 1 %   Eosinophils Absolute 0.1 0.0 - 0.5 K/uL   Basophils Relative 0 %   Basophils Absolute 0.0 0.0 - 0.1 K/uL   Immature Granulocytes 1 %    Abs Immature Granulocytes 0.10 (H) 0.00 - 0.07 K/uL    Comment: Performed at [facility], [address], [place], [place] [address]  I-Stat arterial blood gas, [facility] ED)     Status: Abnormal   Collection Time: 05/30/2020  8:45 AM  Result Value Ref Range   pH, Arterial 7.434 7.350 - 7.450   pCO2 arterial 34.2 32.0 - 48.0 mmHg   pO2, Arterial 83 83.0 - 108.0 mmHg   Bicarbonate 22.9 20.0 - 28.0 mmol/L   TCO2 24 22 - 32 mmol/L   O2 Saturation 97.0 %   Acid-base deficit 1.0 0.0 - 2.0 mmol/L   Sodium 118 (LL) 135 - 145 mmol/L   Potassium 5.2 (H) 3.5 - 5.1 mmol/L   Calcium, Ion 1.04 (L) 1.15 - 1.40 mmol/L   HCT 31.0 (L) 36.0 - 46.0 %   Hemoglobin 10.5 (L) 12.0 - 15.0 g/dL   Sample type ARTERIAL    Comment NOTIFIED PHYSICIAN   Ammonia     Status: None   Collection Time: 05/29/2020 10:22 AM  Result Value Ref Range   Ammonia 16 9 - 35 umol/L    Comment: Performed at [facility], [place] [address], [place], [place] [address]  TSH     Status: Abnormal   Collection Time: 06/12/2020 10:22 AM  Result Value Ref Range   TSH 4.554 (H) 0.350 - 4.500 uIU/mL    Comment: Performed by a 3rd Generation assay with a functional sensitivity of <=0.01 uIU/mL. Performed at [facility], [place] [address], [place], [place] [address]      ROS: See HPI   Physical Exam: Vitals:   06/04/2020 1225 06/12/2020 1230  BP: (!) 250/137 (!) 242/130  Pulse: (!) 135 (!) 130  Resp: 20 20  Temp:    SpO2: 91% 92%  General: Obese somnolent white female intubated in ER HEENT: [place], airway intubated, no eye movement.  Nonicteric sclera Neck: Mild JVD Heart: RRR, soft 1/6 SEM no rub gallop appreciated Lungs: Intubated nonlabored breathing bilaterally fine rales Abdomen: Obese bowel sounds normoactive soft, ND NT, abdominal wall edema but no ascites Extremities: No pedal edema 1+ Skin: Flaky skin lower extremities no pedal ulcers appreciated or overt rash Neuro: Somnolent intubated does not follow  request Dialysis Access: [facility] dressing dry clear  Dialysis Orders: Center: ADM F. on TTS. EDW 75.5 kg 2K, 2 CA bath HD , 3 hours 45 minutes  Heparin 7400 units. Access PermCath    Mircera 60 MCG q 2wks last given 05/07/20  Venofer 50 weekly  Assessment/Plan 1. Hypertension crisis/volume overload/pulmonary edema acute respiratory failure/  Missed OP hemodialysis=  HD today attempt 4 to 5 L UF(she has tolerated this as an outpatient) CCM consulting  Cardene to be used if BP not lowered with Propofol  note she does take midodrine 10 mg Tuesday Thursday Saturday twice daily for dialysis BP support as OP.  So this is mostly because she has huge gains and has to have high level UF wth her treatments 2. ESRD -HD TTS normal schedule with ho noncompliance attendance, K5.2, NA 118 - for volume overload HD today.  I definitely think the hyponatremia is much due to her volume overload but cannot rule out another etiology-  Will make sure it improved after HD-  HCT negative but could she have something like a paraneoplastic syndrome ?  3. AMS/seizure activity= neuro eval /LP and MRI per neurology-  Likely toxic metabolic-  Sodium and bun 4. Hyponatremia, NA 118= probably secondary to her volume overload follow-up sodium trend with HD 5. Anemia of ESRD= Hgb 10.5, next dose of ESA(Aranesp 60 q. Tuesday HD) Venofer weekly hold iron until infection can be ruled out 6. Metabolic bone disease -calcium 8.3 no vitamin D at outpatient unit, continue calcium acetate and Sensipar when taking p.o.'s 7. Nutrition -ALB 3.3, currently n.p.o.  Ernest Haber, PA-C [place] [phone] 06/07/2020, 12:45 PM   Patient seen and examined, agree with above note with above modifications. Chronically ill and non compliant HD patient-  Her last hospital admission mostly due to missed dialysis landed her now in a NH.  She has large gains in between treatments and if could get to more food has a history of life  threatening hyperkalemia due to missed HD-  I hope the sodium is just due to volume and not something more ominous.  Will do HD today and follow closely   Corliss Parish, MD 06/12/2020

## 2020-05-19 ENCOUNTER — Inpatient Hospital Stay (HOSPITAL_COMMUNITY): Payer: Medicare Other

## 2020-05-19 DIAGNOSIS — I161 Hypertensive emergency: Secondary | ICD-10-CM | POA: Diagnosis not present

## 2020-05-19 DIAGNOSIS — N186 End stage renal disease: Secondary | ICD-10-CM

## 2020-05-19 DIAGNOSIS — Z0189 Encounter for other specified special examinations: Secondary | ICD-10-CM | POA: Diagnosis not present

## 2020-05-19 DIAGNOSIS — Z992 Dependence on renal dialysis: Secondary | ICD-10-CM | POA: Diagnosis not present

## 2020-05-19 DIAGNOSIS — Z452 Encounter for adjustment and management of vascular access device: Secondary | ICD-10-CM | POA: Diagnosis not present

## 2020-05-19 DIAGNOSIS — I6783 Posterior reversible encephalopathy syndrome: Secondary | ICD-10-CM | POA: Diagnosis not present

## 2020-05-19 DIAGNOSIS — E875 Hyperkalemia: Secondary | ICD-10-CM

## 2020-05-19 DIAGNOSIS — E871 Hypo-osmolality and hyponatremia: Secondary | ICD-10-CM | POA: Diagnosis not present

## 2020-05-19 DIAGNOSIS — R569 Unspecified convulsions: Secondary | ICD-10-CM

## 2020-05-19 LAB — CBC
HCT: 22.4 % — ABNORMAL LOW (ref 36.0–46.0)
HCT: 20.5 % — ABNORMAL LOW (ref 36.0–46.0)
Hemoglobin: 7.8 g/dL — ABNORMAL LOW (ref 12.0–15.0)
Hemoglobin: 6.7 g/dL — CL (ref 12.0–15.0)
MCH: 30.8 pg (ref 26.0–34.0)
MCH: 29.8 pg (ref 26.0–34.0)
MCHC: 34.8 g/dL (ref 30.0–36.0)
MCHC: 32.7 g/dL (ref 30.0–36.0)
MCV: 88.5 fL (ref 80.0–100.0)
MCV: 91.1 fL (ref 80.0–100.0)
Platelets: 227 10*3/uL (ref 150–400)
Platelets: 190 10*3/uL (ref 150–400)
RBC: 2.53 MIL/uL — ABNORMAL LOW (ref 3.87–5.11)
RBC: 2.25 MIL/uL — ABNORMAL LOW (ref 3.87–5.11)
RDW: 15.3 % (ref 11.5–15.5)
RDW: 15.4 % (ref 11.5–15.5)
WBC: 11.8 10*3/uL — ABNORMAL HIGH (ref 4.0–10.5)
WBC: 9.5 10*3/uL (ref 4.0–10.5)
nRBC: 0 % (ref 0.0–0.2)
nRBC: 0 % (ref 0.0–0.2)

## 2020-05-19 LAB — POCT I-STAT 7, (LYTES, BLD GAS, ICA,H+H)
Acid-Base Excess: 4 mmol/L — ABNORMAL HIGH (ref 0.0–2.0)
Bicarbonate: 28.6 mmol/L — ABNORMAL HIGH (ref 20.0–28.0)
Calcium, Ion: 1.14 mmol/L — ABNORMAL LOW (ref 1.15–1.40)
HCT: 23 % — ABNORMAL LOW (ref 36.0–46.0)
Hemoglobin: 7.8 g/dL — ABNORMAL LOW (ref 12.0–15.0)
O2 Saturation: 95 %
Patient temperature: 97.2
Potassium: 3.7 mmol/L (ref 3.5–5.1)
Sodium: 130 mmol/L — ABNORMAL LOW (ref 135–145)
TCO2: 30 mmol/L (ref 22–32)
pCO2 arterial: 39.2 mmHg (ref 32.0–48.0)
pH, Arterial: 7.469 — ABNORMAL HIGH (ref 7.350–7.450)
pO2, Arterial: 71 mmHg — ABNORMAL LOW (ref 83.0–108.0)

## 2020-05-19 LAB — PREPARE RBC (CROSSMATCH)

## 2020-05-19 LAB — BASIC METABOLIC PANEL
Anion gap: 13 (ref 5–15)
BUN: 27 mg/dL — ABNORMAL HIGH (ref 8–23)
CO2: 25 mmol/L (ref 22–32)
Calcium: 8.5 mg/dL — ABNORMAL LOW (ref 8.9–10.3)
Chloride: 91 mmol/L — ABNORMAL LOW (ref 98–111)
Creatinine, Ser: 4.74 mg/dL — ABNORMAL HIGH (ref 0.44–1.00)
GFR, Estimated: 10 mL/min — ABNORMAL LOW (ref >60)
Glucose, Bld: 94 mg/dL (ref 70–99)
Potassium: 3.5 mmol/L (ref 3.5–5.1)
Sodium: 129 mmol/L — ABNORMAL LOW (ref 135–145)

## 2020-05-19 LAB — HEMOGLOBIN AND HEMATOCRIT, BLOOD
HCT: 22.9 % — ABNORMAL LOW (ref 36.0–46.0)
Hemoglobin: 7.6 g/dL — ABNORMAL LOW (ref 12.0–15.0)

## 2020-05-19 LAB — MAGNESIUM: Magnesium: 2.2 mg/dL (ref 1.7–2.4)

## 2020-05-19 LAB — GLUCOSE, CAPILLARY: Glucose-Capillary: 102 mg/dL — ABNORMAL HIGH (ref 70–99)

## 2020-05-19 LAB — TRIGLYCERIDES: Triglycerides: 145 mg/dL (ref <150)

## 2020-05-19 LAB — SODIUM: Sodium: 129 mmol/L — ABNORMAL LOW (ref 135–145)

## 2020-05-19 LAB — MRSA PCR SCREENING: MRSA by PCR: NEGATIVE

## 2020-05-19 IMAGING — MR MR HEAD W/O CM
16 series · 42 of 48 positions shown · non-contrast
Comparison: CT a of the head neck from yesterday

CLINICAL DATA: Seizure-like activity.  History of trauma.

EXAM:
MRI HEAD WITHOUT CONTRAST
TECHNIQUE: Multiplanar, multiecho pulse sequences of the brain and surrounding
structures were obtained without intravenous contrast.

[Series 5: DWI · axial · 3.0mm · 0.88mm/px · z∈[-108,+32]mm · 5 of 96 slices shown (1 of 4)]
[im 1/96]
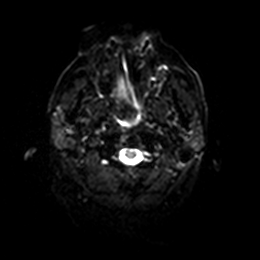
[im 24/96]
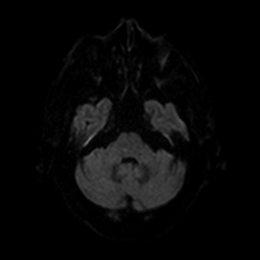
[im 48/96]
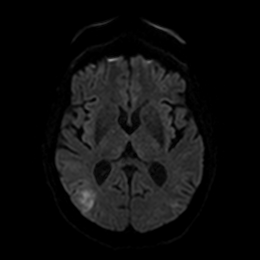
[im 72/96]
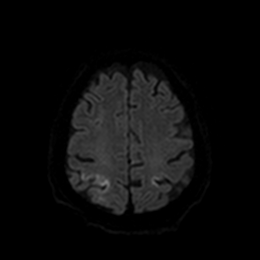
[im 96/96]
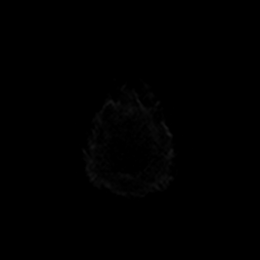

[Series 6: DWI · axial · 3.0mm · 0.88mm/px · z∈[-108,+32]mm · 2 of 48 slices shown (2 of 4)]
[im 1/48]
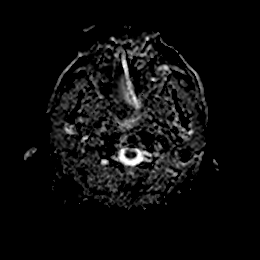
[im 48/48]
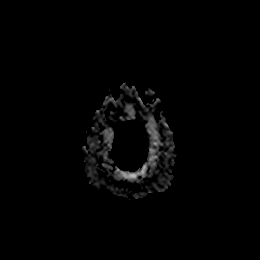

[Series 7: DWI · coronal · 4.0mm · 0.88mm/px · 3 of 68 slices shown (3 of 4)]
[im 1/68]
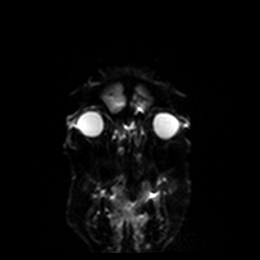
[im 34/68]
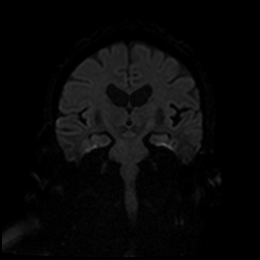
[im 68/68]
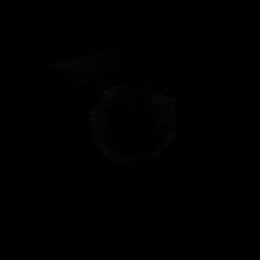

[Series 8: DWI · coronal · 4.0mm · 0.88mm/px · 2 of 34 slices shown (4 of 4)]
[im 1/34]
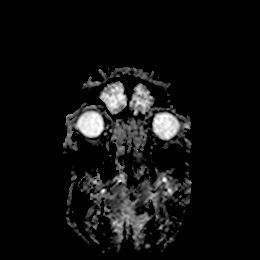
[im 34/34]
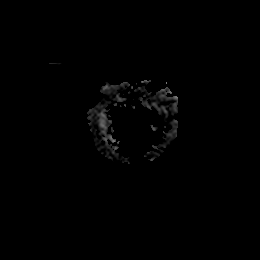

[Series 9: T1 · sagittal · 5.0mm · 0.75mm/px · 1 of 25 slices shown]
[im 1/25]
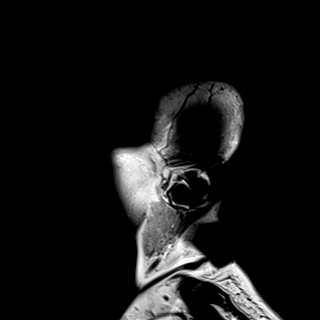

[Series 10: T2 · axial · 5.0mm · 0.72mm/px · 1 of 25 slices shown (1 of 3)]
[im 1/25]
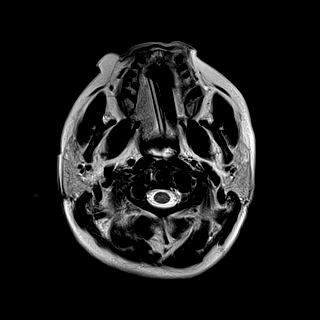

[Series 11: FLAIR · axial · 5.0mm · 0.45mm/px · 1 of 25 slices shown (1 of 2)]
[im 1/25]
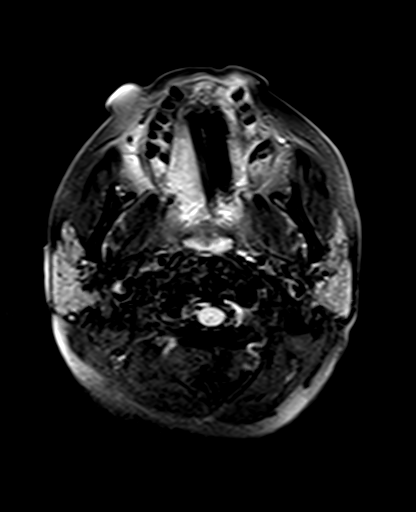

[Series 12: mag_images · axial · 3.0mm · 0.90mm/px · z∈[-109,+54]mm · 3 of 56 slices shown]
[im 1/56]
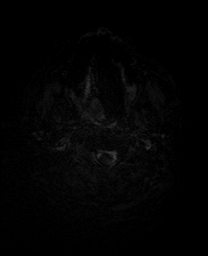
[im 28/56]
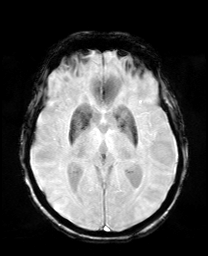
[im 56/56]
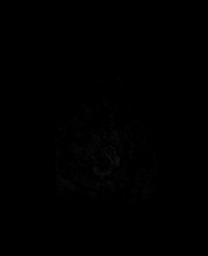

[Series 13: pha_images · axial · 3.0mm · 0.90mm/px · z∈[-109,+54]mm · 3 of 56 slices shown]
[im 1/56]
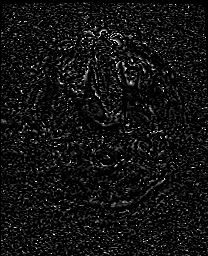
[im 28/56]
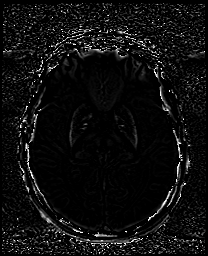
[im 56/56]
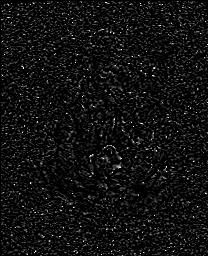

[Series 14: swi_images · axial · 3.0mm · 0.90mm/px · z∈[-109,+54]mm · 3 of 56 slices shown]
[im 1/56]
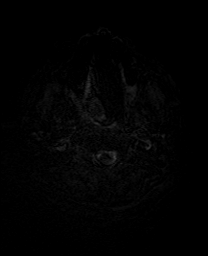
[im 28/56]
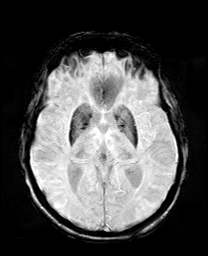
[im 56/56]
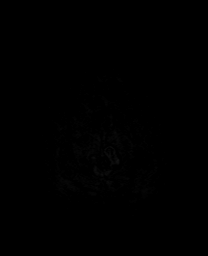

[Series 15: mip_images(sw) · axial · 24.0mm · 0.90mm/px · z∈[-99,+43]mm · 2 of 49 slices shown]
[im 1/49]
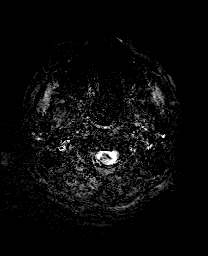
[im 49/49]
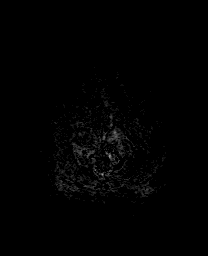

[Series 16: t1_mprage_tra_p2_iso · axial · 1.0mm · 0.98mm/px · z∈[-115,+58]mm · 8 of 176 slices shown]
[im 1/176]
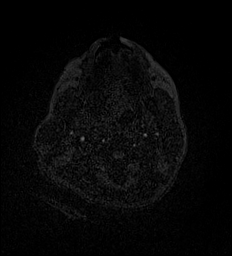
[im 22/176]
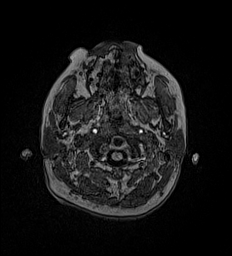
[im 44/176]
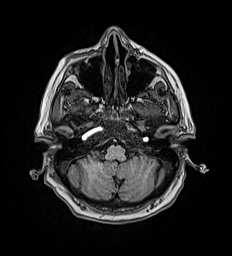
[im 66/176]
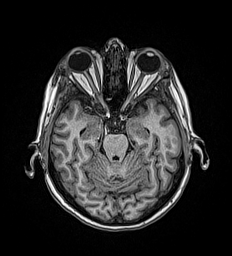
[im 110/176]
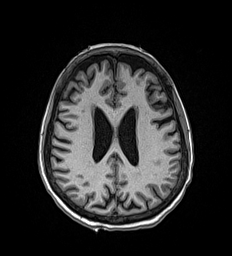
[im 132/176]
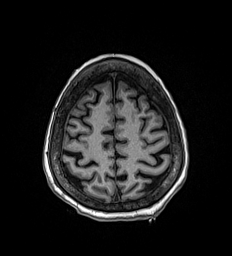
[im 154/176]
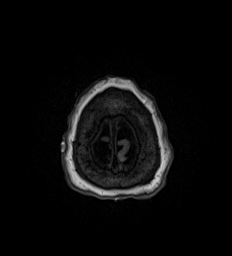
[im 176/176]
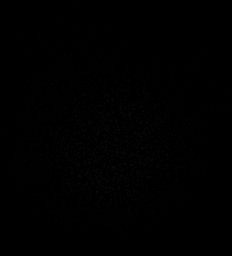

[Series 17: t1_mprage_tra_p2_iso_mpr_coronal · coronal · 1.0mm · 0.45mm/px · 3 of 171 slices shown]
[im 1/171]
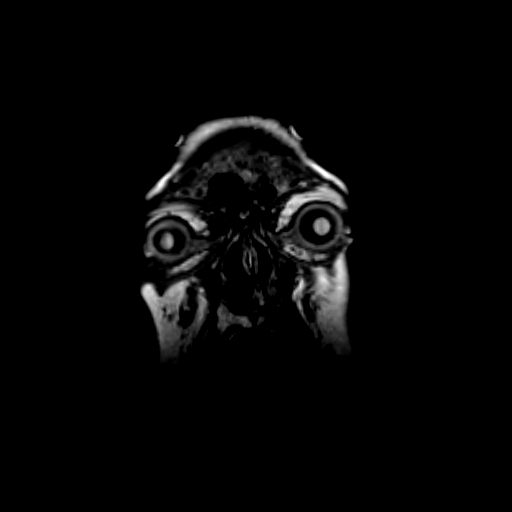
[im 25/171]
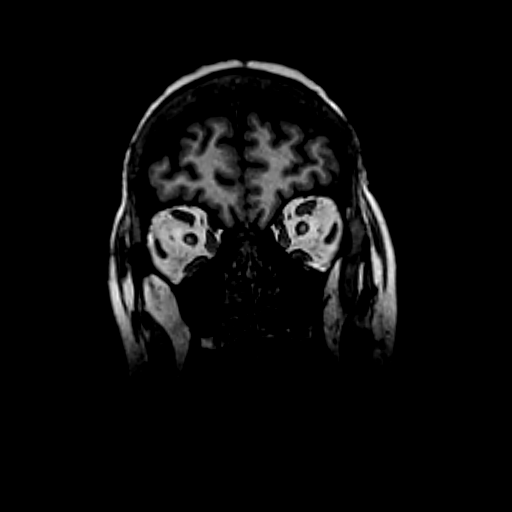
[im 49/171]
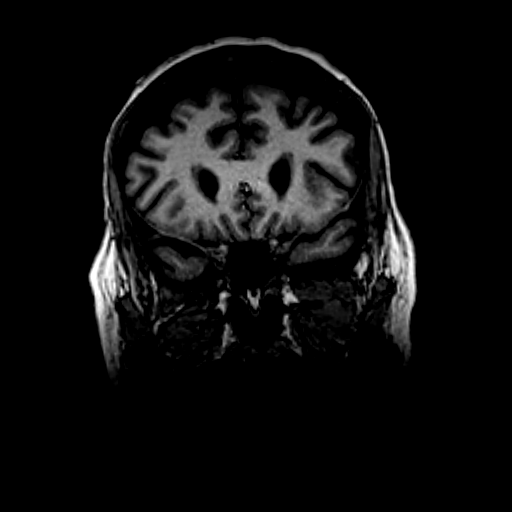

[Series 18: T2 · coronal · 3.0mm · 0.27mm/px · 2 of 32 slices shown (2 of 3)]
[im 1/32]
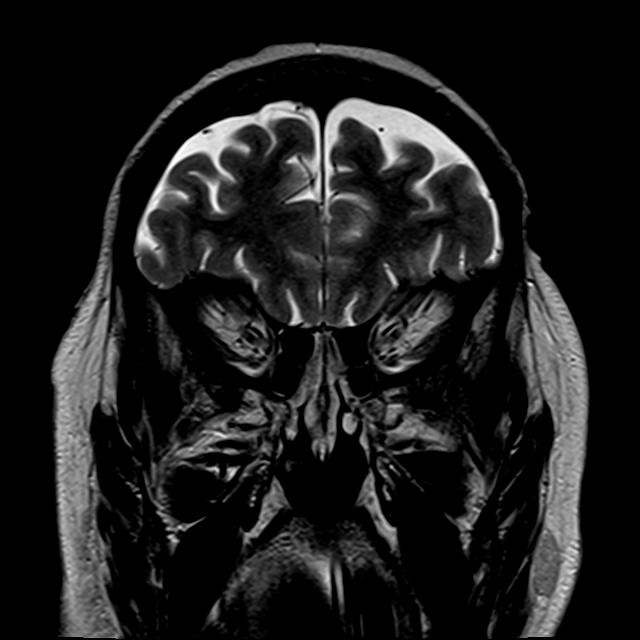
[im 32/32]
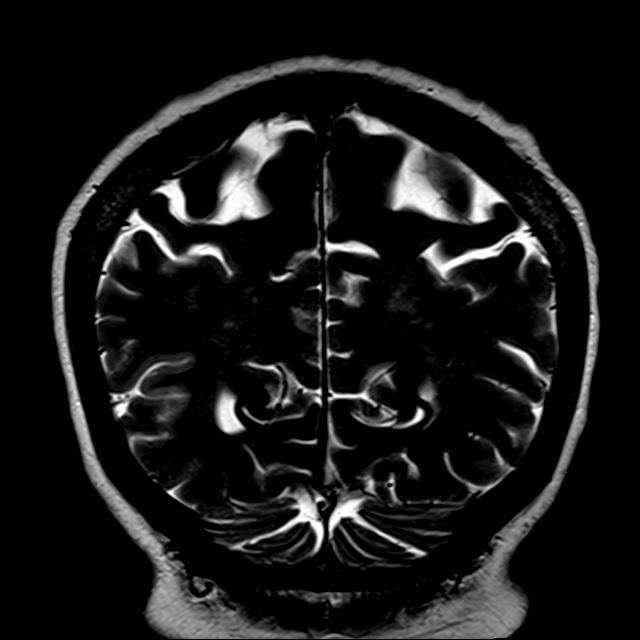

[Series 19: FLAIR · coronal · 3.0mm · 0.56mm/px · 2 of 32 slices shown (2 of 2)]
[im 1/32]
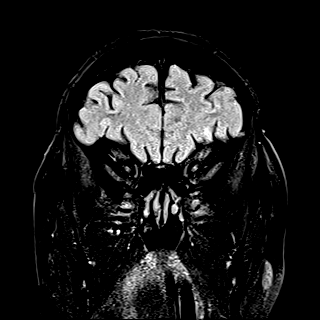
[im 32/32]
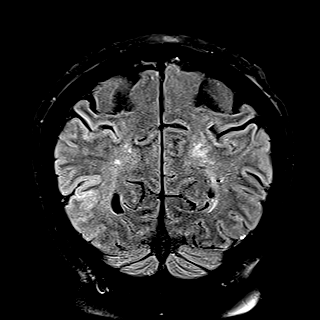

[Series 20: T2 · coronal · 5.0mm · 0.34mm/px · 1 of 29 slices shown (3 of 3)]
[im 1/29]
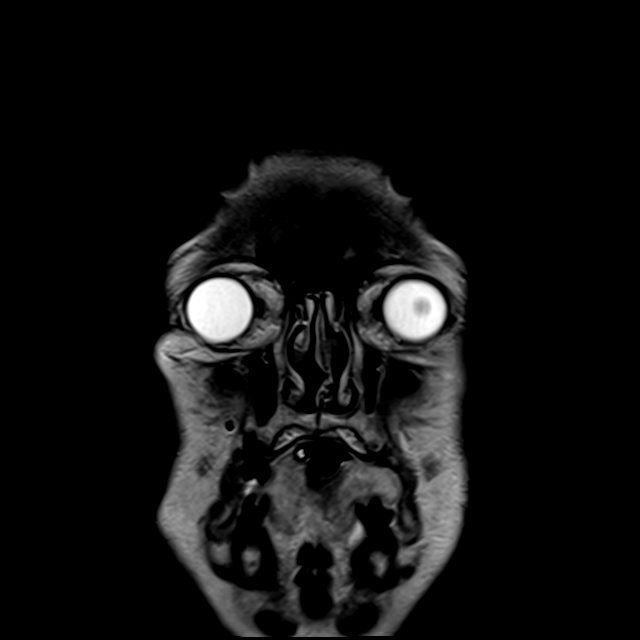

[42 of 48 positions shown; findings below may reference images not displayed]

FINDINGS: Brain: Bilateral posterior cerebral cortical FLAIR and diffusion
hyperintensity mainly in the parietooccipital convexities and mildly
extending into the posterior right temporal convexity. Given the
symmetry and prior CTA, arterial occlusive disease is unlikely. The
dural sinuses show normal flow voids. White matter and deep gray
nuclei are spared and there is no reported history of fever to
implicate cerebritis. No anterior involvement typical of watershed
pattern and no thalamic involvement as sometimes seen with seizure
phenomenon. No hemorrhage, hydrocephalus, mass, or collection.

Vascular: Preserved flow voids

Skull and upper cervical spine: Hypointense marrow diffusely, likely
related to patient's end-stage renal disease. Presumed periapical
granuloma associated with a left upper canine or premolar.

Sinuses/Orbits: Negative
IMPRESSION: Bilateral posterior cerebral cortical swelling and restricted
diffusion, favor complicated posterior reversible encephalopathy
syndrome or seizure phenomenon.

## 2020-05-19 IMAGING — DX DG CHEST 1V PORT
1 series · 1 of 1 positions shown · non-contrast
Comparison: Radiograph [DATE]

CLINICAL DATA: Central line placement

EXAM:
PORTABLE CHEST 1 VIEW

[chest]
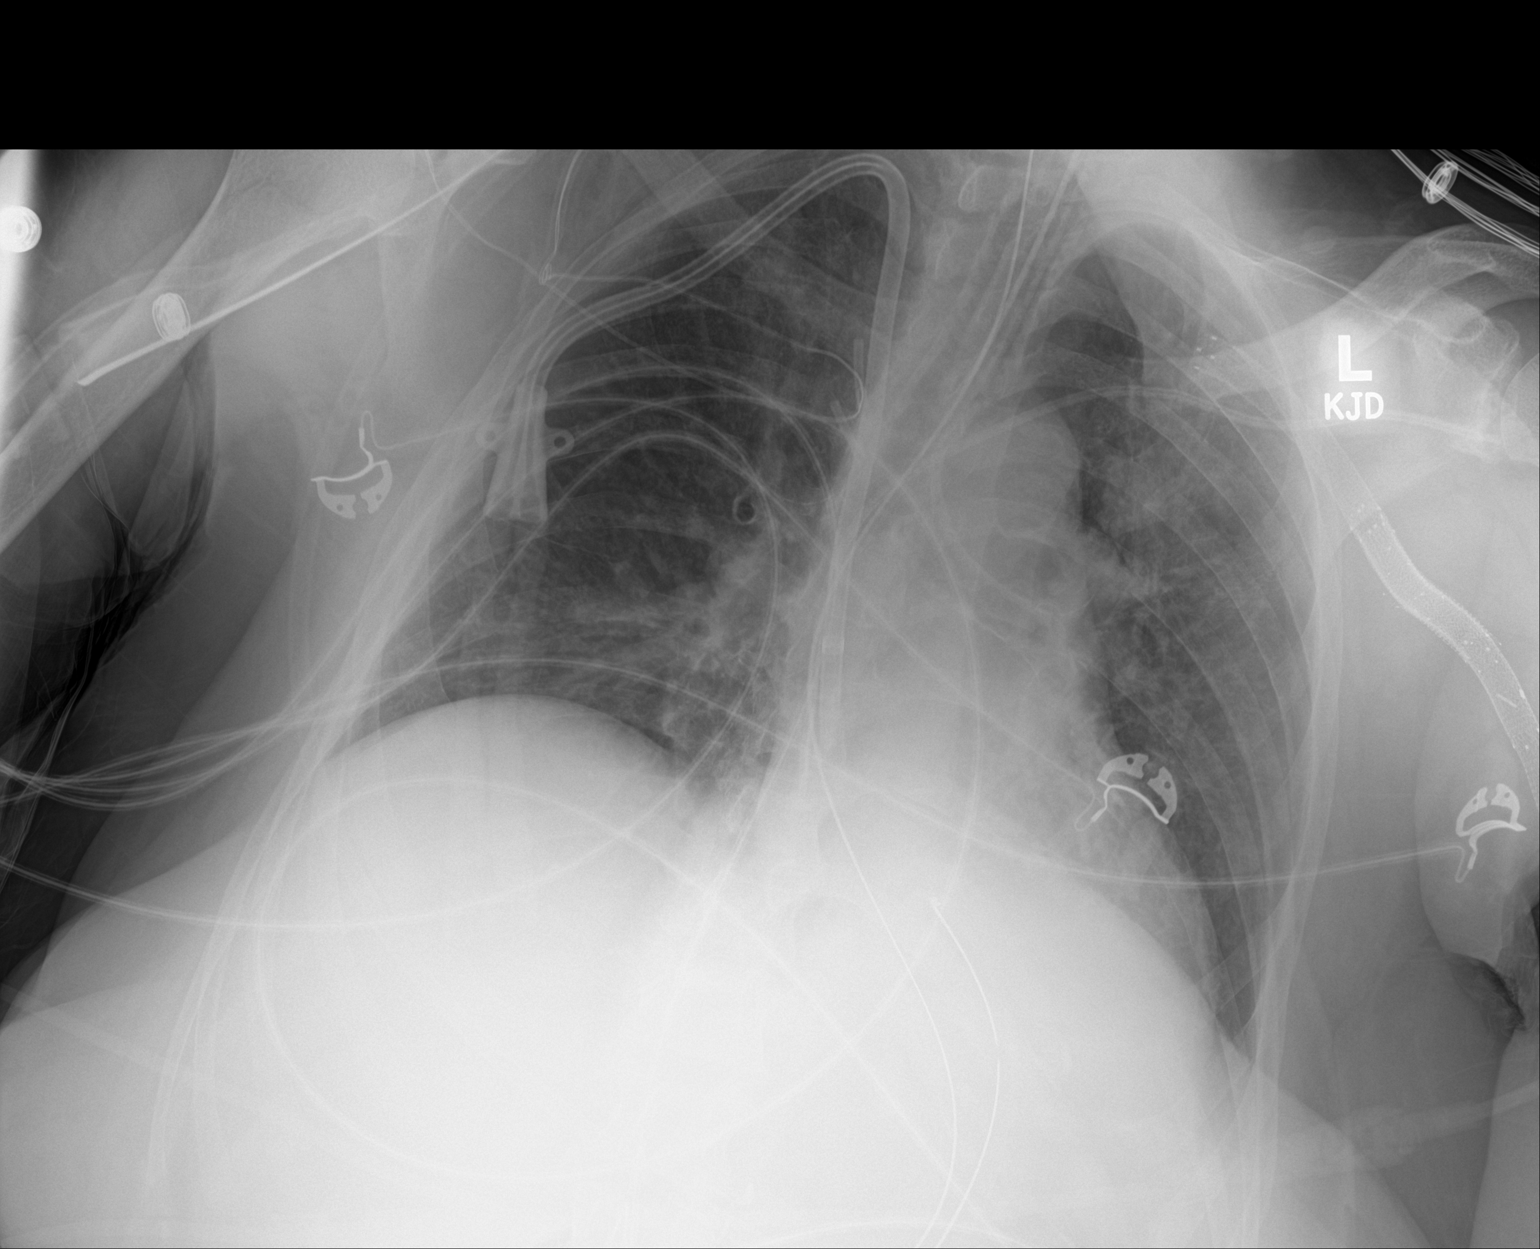

[1 of 1 positions shown; findings below may reference images not displayed]

FINDINGS: Endotracheal tube tip terminates in the mid trachea, 3.6 cm the
carina. Transesophageal tube tip and side port terminate distal to
the GE junction, coiling in the stomach as demonstrated on
comparison abdominal radiograph. Dual lumen right IJ approach
dialysis catheter tip terminates at the superior cavoatrial
junction. Left upper extremity PICC tip terminates at a similar
level. Left axillary vascular stent is again noted. Telemetry leads
and external support devices overlie the chest.

Some slightly diminished hazy bilateral pulmonary opacities in a
perihilar predominance, could reflect improving volumes or edema. No
pneumothorax or visible effusion. No new consolidative process is
seen. Stable cardiomediastinal contours. No acute osseous or soft
tissue abnormality. Degenerative changes are present in the imaged
spine and shoulders.
IMPRESSION: 1. Lines and tubes in satisfactory position.
2. Slightly diminished hazy bilateral pulmonary opacities in a
perihilar predominance, could reflect improving volumes or edema. No
new consolidative process.

## 2020-05-19 MED ORDER — ATROPINE SULFATE 1 MG/10ML IJ SOSY
PREFILLED_SYRINGE | INTRAMUSCULAR | Status: AC
  Filled 2020-05-19: qty 20

## 2020-05-19 MED ORDER — CHLORHEXIDINE GLUCONATE CLOTH 2 % EX PADS
6.0000 | MEDICATED_PAD | Freq: Every day | CUTANEOUS | Status: DC
  Administered 2020-05-20 – 2020-05-21 (×3): 6 via TOPICAL

## 2020-05-19 MED ORDER — DARBEPOETIN ALFA 150 MCG/0.3ML IJ SOSY
150.0000 ug | PREFILLED_SYRINGE | INTRAMUSCULAR | Status: DC
  Administered 2020-05-21: 150 ug via INTRAVENOUS
  Filled 2020-05-19: qty 0.3

## 2020-05-19 MED ORDER — SODIUM CHLORIDE 0.9% IV SOLUTION: Freq: Once | INTRAVENOUS | Status: AC

## 2020-05-19 NOTE — Progress Notes (Signed)
Neurology Progress Note  Patient ID: Mary Wilcox is a 65 y.o. with PMHx of  has a past medical history of Bilateral hydronephrosis (2006), Cataract, Cervical cancer ([place]) (2006), Chronic kidney disease, Depression, ESRD on hemodialysis ([place]), Hypothyroidism, Insomnia, Iron deficiency anemia, Peripheral vascular disease ([place]), Secondary hyperparathyroidism (of renal origin), Transfusion history, and Wears glasses.  Initially consulted for: New onset seizures in the setting of acute encephalopathy w/ hypertensive urgency/emergency, hyponatremia, missed dialysis with uremia   Major interval events/Subjective: LP completed under fluoro  Hypotensive overnight, required central line placement and pressors Per nursing during dialysis patient intermittently hypotensive to 60s, then hypertensive to 200s Started on LTM Nursing pushed button ~11 AM for left arm shaking movements  Exam: Vitals:   05/19/20 0600 05/19/20 0700  BP: 118/60 (!) 120/53  Pulse: 81 78  Resp: 20 20  Temp:  98.24 F (36.8 C)  SpO2: 100% 100%   Gen: In bed, comfortable  Resp: non-labored breathing, no grossly audible wheezing Cardiac: Perfusing extremities well  Abd: soft, nt Extremities: Edematous throughout   Mental Status: Patient is obtunded, today eyes do not open to noxious stimulation, she does not fixate or track or follow commands. Cranial Nerves: II: She did not blink to threat pupils are equal, round, and reactive to light, sluggish, 2.5 mm to 2 mm III,IV, VI: Eyes are midline, VOR better to the right than the left, but is crossing midline in both directions V: VII: Corneals are intact Motor: Today she has extensor movement of bilateral upper extremities, right side more brisk than the left, with some tremulousness.  3/5 She moved all extremities spontaneously and quasi-purposefully Sensory: She withdraws from noxious stimulation in all four extremities  Cerebellar: Does not perform  Pertinent  Data:   Basic Metabolic Panel: Recent Labs  Lab 06/11/2020 0700 06/03/2020 0845 05/19/2020 1347 06/07/2020 1557 06/11/2020 1558 05/19/20 0437 05/19/20 0518  NA 119* 118* 118* 120* 118* 129* 130*  K 5.5* 5.2* 5.8*  --  5.8* 3.5 3.7  CL 83*  --  82*  --   --  91*  --   CO2 20*  --  19*  --   --  25  --   GLUCOSE 113*  --  234*  --   --  94  --   BUN 85*  --  90*  --   --  27*  --   CREATININE 10.02*  --  10.53*  --   --  4.74*  --   CALCIUM 8.3*  --  8.2*  --   --  8.5*  --   MG  --   --  3.0*  --   --  2.2  --   PHOS  --   --  6.0*  --   --   --   --     CBC: Recent Labs  Lab 06/05/2020 0700 05/24/2020 0845 06/08/2020 1347 06/07/2020 1558 05/19/20 0437 05/19/20 0518  WBC 15.2*  --  24.2*  --  11.8*  --   NEUTROABS 13.4*  --   --   --   --   --   HGB 10.5* 10.5* 10.2* 10.5* 7.8* 7.8*  HCT 30.5* 31.0* 29.2* 31.0* 22.4* 23.0*  MCV 89.4  --  90.1  --  88.5  --   PLT 310  --  420*  --  227  --     Coagulation Studies: Recent Labs    05/14/2020 1347  LABPROT 14.1  INR 1.1  LP results: RBC 26, WBC 2, Protein 60, Glucose 95 (serum 234) Gram stain negative HSV pending  EEG 3/5: "This EEG recorded a partial seizure likely left frontal in origin as described above.  The majority of the seizure was actually subclinical in nature, followed by secondary generalization with convulsion." EEG 3/6: Generalized slowing, no seizures  MRI personally reviewed, bilateral occipital restricted diffusion w/ ADC correlate, R > L, agree with radiology, c/f PRES, less likely seizure related change, less likely infarct   Impression: 65 year old with new onset left frontal seizures with secondary generalization, with multiple seizure threshold lowering metabolic derangements.  Exam worse today, possibly in the setting of accumulating sedation. MRI most consistent with PRES likely secondary to hypertensive emergency in the setting of missed dialysis. LP results reassuring overall, elevated protein can be  non-specific but may also be seen early in course of viral infection so will continue acyclovir for now.  Recommendations: Prelim pending review of LTM - continue LTM EEG while weaning propofol and midazolam today - continue Keppra 500 mg BID  - continue acyclovir renally adjusted for meningitis coverage until PCR from CSF results negative for HSV - Continue goal normotension - Appreciate excellent management of comorbidities per CCM - Neurology will continue to follow   [place] [phone]   CRITICAL CARE Performed by: Lorenza Chick  Total critical care time: 35 minutes  Critical care time was exclusive of separately billable procedures and treating other patients.  Critical care was necessary to treat or prevent imminent or life-threatening deterioration.  Critical care was time spent personally by me on the following activities: development of treatment plan with patient and/or surrogate as well as nursing, discussions with consultants, evaluation of patient's response to treatment, examination of patient, obtaining history from patient or surrogate, ordering and performing treatments and interventions, ordering and review of laboratory studies, ordering and review of radiographic studies, pulse oximetry and re-evaluation of patient's condition.

## 2020-05-19 NOTE — Procedures (Addendum)
Patient Name: Mary Wilcox  MRN: [id number]  Epilepsy Attending: Lora Havens  Referring Physician/Provider: Dr Roland Rack Duration: 06/11/2020 1156 to 05/19/2020 1156  Patient history:  65 year old female being evaluated for altered mental status. EEG to evaluate for seizure  Level of alertness:comatose  AEDs during EEG study: LEV, versed, propofol  Technical aspects: This EEG study was done with scalp electrodes positioned according to the 10-20 International system of electrode placement. Electrical activity was acquired at a sampling rate of 500Hz  and reviewed with a high frequency filter of 70Hz  and a low frequency filter of 1Hz . EEG data were recorded continuously and digitally stored.   Description:  EEG showed continuous generalized 3 to 6 Hz theta-delta slowing admixed with 15-18hz  frontocentral beta activity. Hyperventilation and photic stimulation were not performed.    Event button was pressed on 05/19/2020 at 1055 during which patient was noted to have subtle left> right upper extremity twitching.  Concomitant EEG before, during and after the event did not show any change in seizure.  Parts of study were difficult to interpret due to significant electrode artifact.   ABNORMALITY -Continuous slow, generalized  IMPRESSION: This study is suggestive of moderate diffuse encephalopathy, nonspecific etiology. No seizures or epileptiform discharges were seen throughout the recording.  One event was recorded on 05/19/2020 at 1055 as described above without concomitant EEG change and was most likely not an epileptic event.  Mary Wilcox

## 2020-05-19 NOTE — Progress Notes (Signed)
Hb dropped to 6.7 from 7.8, 10 on 3/5 Transfuse 1 U PRBC  Mary Wilcox V. Elsworth Soho MD

## 2020-05-19 NOTE — Progress Notes (Signed)
Subjective:  Had dialysis overnight-   Removed only 1800 due to low BPs but corrected sodium to 129, then 130- had MRI this AM-  C/w posterior reversible encephalopathy-  LP at least does not show meningitis- I guess still seizing-  On propofol/fentanyl and midazolam but also needing a little pressor  Objective Vital signs in last 24 hours: Vitals:   05/19/20 0430 05/19/20 0500 05/19/20 0600 05/19/20 0700  BP: 128/66 129/67 118/60 (!) 120/53  Pulse: 95 89 81 78  Resp: (!) '23 20 20 20  ' Temp:    98.24 F (36.8 C)  TempSrc:      SpO2: 100% 98% 100% 100%  Weight:      Height:       Weight change:   Intake/Output Summary (Last 24 hours) at 05/19/2020 0731 Last data filed at 05/19/2020 0700 Gross per 24 hour  Intake 1497.52 ml  Output 1858 ml  Net -360.48 ml    Dialysis Orders: Center: [facility]M F. on TTS. EDW 75.5 kg 2K, 2 CA bath HD , 3 hours 45 minutes  Heparin 7400 units. Access PermCath    Mircera 60 MCG q 2wks last given 05/07/20  Venofer 50 weekly  Assessment/Plan 1. Hypertension crisis/volume overload/pulmonary edema acute respiratory failure/  Missed OP hemodialysis=  were able to do HD late yesterday but only able to remove 1800 given low BP- now on the vent for airway protection -  FIO2 50, note she does take midodrine 10 mg Tuesday Thursday Saturday twice daily for dialysis BP support as OP but  this is mostly because she has huge gains and has to have high level UF wth her treatments 2. ESRD -HD TTS normal schedule with ho noncompliance attendance, K5.2, NA 118 - for volume overload HD today.  I definitely think the hyponatremia is much due to her volume overload.  Will give her a rest today and plan for another HD treatment on Monday-  Hope that we might be more successful with UF then if pressors/sedation down -  I dont see any indications for CRRT today  3. AMS/seizure activity= neuro eval /LP and MRI per neurology-  Likely toxic metabolic-  Sodium and bun-  MRI c/w PRES-  On  keppra and continuous EEG  4. Hyponatremia, NA 118= probably ? secondary to her volume overload follow-up sodium trend with HD- did correct to 130 after HD-  Cont UF 5. Anemia of ESRD= Hgb 10.5, then down to 7's.   Will give ESA,  Venofer weekly hold iron until infection can be ruled out 6. Metabolic bone disease -calcium 8.3 no vitamin D at outpatient unit, continue calcium acetate and Sensipar when taking p.o.'s 7. Nutrition -ALB 3.3, currently n.p.o.    Louis Meckel    Labs: Basic Metabolic Panel: Recent Labs  Lab 06/09/2020 0700 05/21/2020 0845 05/24/2020 1347 05/25/2020 1557 06/07/2020 1558 05/19/20 0437 05/19/20 0518  NA 119*   < > 118*   < > 118* 129* 130*  K 5.5*   < > 5.8*  --  5.8* 3.5 3.7  CL 83*  --  82*  --   --  91*  --   CO2 20*  --  19*  --   --  25  --   GLUCOSE 113*  --  234*  --   --  94  --   BUN 85*  --  90*  --   --  27*  --   CREATININE 10.02*  --  10.53*  --   --  4.74*  --   CALCIUM 8.3*  --  8.2*  --   --  8.5*  --   PHOS  --   --  6.0*  --   --   --   --    < > = values in this interval not displayed.   Liver Function Tests: Recent Labs  Lab 05/16/2020 0700 05/25/2020 1347  AST 13* 21  ALT 10 11  ALKPHOS 94 103  BILITOT 0.7 0.9  PROT 6.5 6.5  ALBUMIN 3.3* 3.2*   Recent Labs  Lab 06/01/2020 1347  LIPASE 33  AMYLASE 32   Recent Labs  Lab 06/05/2020 1022  AMMONIA 16   CBC: Recent Labs  Lab 06/08/2020 0700 05/17/2020 0845 06/11/2020 1347 05/28/2020 1558 05/19/20 0437 05/19/20 0518  WBC 15.2*  --  24.2*  --  11.8*  --   NEUTROABS 13.4*  --   --   --   --   --   HGB 10.5*   < > 10.2* 10.5* 7.8* 7.8*  HCT 30.5*   < > 29.2* 31.0* 22.4* 23.0*  MCV 89.4  --  90.1  --  88.5  --   PLT 310  --  420*  --  227  --    < > = values in this interval not displayed.   Cardiac Enzymes: No results for input(s): CKTOTAL, CKMB, CKMBINDEX, TROPONINI in the last 168 hours. CBG: Recent Labs  Lab 05/19/20 0437  GLUCAP 102*    Iron Studies: No results  for input(s): IRON, TIBC, TRANSFERRIN, FERRITIN in the last 72 hours. Studies/Results: CT Angio Head W or Wo Contrast  Result Date: 05/17/2020 CLINICAL DATA:  Neuro deficit with stroke suspected. Altered mental status EXAM: CT ANGIOGRAPHY HEAD AND NECK TECHNIQUE: Multidetector CT imaging of the head and neck was performed using the standard protocol during bolus administration of intravenous contrast. Multiplanar CT image reconstructions and MIPs were obtained to evaluate the vascular anatomy. Carotid stenosis measurements (when applicable) are obtained utilizing NASCET criteria, using the distal internal carotid diameter as the denominator. CONTRAST:  55m OMNIPAQUE IOHEXOL 350 MG/ML SOLN COMPARISON:  None. FINDINGS: CT HEAD FINDINGS Brain: No evidence of acute infarction, hemorrhage, hydrocephalus, extra-axial collection or mass lesion/mass effect. Central predominant cerebral volume loss. Vascular: See below Skull: Normal. Negative for fracture or focal lesion. Sinuses: Negative Orbits: Negative Review of the MIP images confirms the above findings CTA NECK FINDINGS Aortic arch: Negative 3 vessel arch. Right carotid system: No evidence of dissection, stenosis (50% or greater) or occlusion. No notable atheromatous changes. Left carotid system: No evidence of dissection, stenosis (50% or greater) or occlusion. No notable atheromatous changes Vertebral arteries: Proximal subclavian atherosclerosis. Skeleton: Generally coarsened trabeculae, suspected renal osteodystrophy. No acute finding. Other neck: Negative Upper chest: Extensive left sided venous stenting. The left upper extremity was injected with no enhancement of the left axillary or subclavian veins at the level of stenting, but with well-formed collaterals. Right-sided dialysis catheter in place. Small layering pleural effusions. Patchy bilateral pulmonary opacity with interlobular septal thickening, overall favor edema in this patient no missed recent  dialysis. Review of the MIP images confirms the above findings CTA HEAD FINDINGS Anterior circulation: Atheromatous calcification along the carotid siphons. No branch occlusion, beading, or aneurysm. Posterior circulation: Vertebral and basilar arteries are smooth and diffusely patent. Fetal type right PCA. No branch occlusion, beading, or aneurysm. Venous sinuses: Diffusely patent Anatomic variants: As above Review of the MIP images confirms the above findings IMPRESSION: 1.  No emergent intracranial or vascular finding. 2. Stented left subclavian and axillary veins with stent occlusion and well-formed collaterals. 3. Pulmonary edema/small pleural effusions. Electronically Signed   By: Monte Fantasia M.D.   On: 05/20/2020 08:38   DG Abd 1 View  Result Date: 06/02/2020 CLINICAL DATA:  OG tube placement. EXAM: ABDOMEN - 1 VIEW COMPARISON:  None. FINDINGS: The enteric tube is looped in the stomach, tip and side ports in the region of the gastric fundus. There is no bowel dilatation to suggest obstruction. Presumed gallstones in the right upper quadrant. Advanced vascular calcifications. Vascular stent in the left axilla partially included. IMPRESSION: Enteric tube looped in the stomach with tip and side-port in the region of the gastric fundus. Electronically Signed   By: Keith Rake M.D.   On: 05/17/2020 18:33   CT Angio Neck W and/or Wo Contrast  Result Date: 05/17/2020 CLINICAL DATA:  Neuro deficit with stroke suspected. Altered mental status EXAM: CT ANGIOGRAPHY HEAD AND NECK TECHNIQUE: Multidetector CT imaging of the head and neck was performed using the standard protocol during bolus administration of intravenous contrast. Multiplanar CT image reconstructions and MIPs were obtained to evaluate the vascular anatomy. Carotid stenosis measurements (when applicable) are obtained utilizing NASCET criteria, using the distal internal carotid diameter as the denominator. CONTRAST:  9m OMNIPAQUE IOHEXOL 350  MG/ML SOLN COMPARISON:  None. FINDINGS: CT HEAD FINDINGS Brain: No evidence of acute infarction, hemorrhage, hydrocephalus, extra-axial collection or mass lesion/mass effect. Central predominant cerebral volume loss. Vascular: See below Skull: Normal. Negative for fracture or focal lesion. Sinuses: Negative Orbits: Negative Review of the MIP images confirms the above findings CTA NECK FINDINGS Aortic arch: Negative 3 vessel arch. Right carotid system: No evidence of dissection, stenosis (50% or greater) or occlusion. No notable atheromatous changes. Left carotid system: No evidence of dissection, stenosis (50% or greater) or occlusion. No notable atheromatous changes Vertebral arteries: Proximal subclavian atherosclerosis. Skeleton: Generally coarsened trabeculae, suspected renal osteodystrophy. No acute finding. Other neck: Negative Upper chest: Extensive left sided venous stenting. The left upper extremity was injected with no enhancement of the left axillary or subclavian veins at the level of stenting, but with well-formed collaterals. Right-sided dialysis catheter in place. Small layering pleural effusions. Patchy bilateral pulmonary opacity with interlobular septal thickening, overall favor edema in this patient no missed recent dialysis. Review of the MIP images confirms the above findings CTA HEAD FINDINGS Anterior circulation: Atheromatous calcification along the carotid siphons. No branch occlusion, beading, or aneurysm. Posterior circulation: Vertebral and basilar arteries are smooth and diffusely patent. Fetal type right PCA. No branch occlusion, beading, or aneurysm. Venous sinuses: Diffusely patent Anatomic variants: As above Review of the MIP images confirms the above findings IMPRESSION: 1. No emergent intracranial or vascular finding. 2. Stented left subclavian and axillary veins with stent occlusion and well-formed collaterals. 3. Pulmonary edema/small pleural effusions. Electronically Signed   By:  JMonte FantasiaM.D.   On: 05/27/2020 08:38   MR BRAIN WO CONTRAST  Result Date: 05/19/2020 CLINICAL DATA:  Seizure-like activity.  History of trauma. EXAM: MRI HEAD WITHOUT CONTRAST TECHNIQUE: Multiplanar, multiecho pulse sequences of the brain and surrounding structures were obtained without intravenous contrast. COMPARISON:  CT a of the head neck from yesterday FINDINGS: Brain: Bilateral posterior cerebral cortical FLAIR and diffusion hyperintensity mainly in the parietooccipital convexities and mildly extending into the posterior right temporal convexity. Given the symmetry and prior CTA, arterial occlusive disease is unlikely. The dural sinuses show normal flow voids. White  matter and deep gray nuclei are spared and there is no reported history of fever to implicate cerebritis. No anterior involvement typical of watershed pattern and no thalamic involvement as sometimes seen with seizure phenomenon. No hemorrhage, hydrocephalus, mass, or collection. Vascular: Preserved flow voids Skull and upper cervical spine: Hypointense marrow diffusely, likely related to patient's end-stage renal disease. Presumed periapical granuloma associated with a left upper canine or premolar. Sinuses/Orbits: Negative IMPRESSION: Bilateral posterior cerebral cortical swelling and restricted diffusion, favor complicated posterior reversible encephalopathy syndrome or seizure phenomenon. Electronically Signed   By: Monte Fantasia M.D.   On: 05/19/2020 04:13   DG CHEST PORT 1 VIEW  Result Date: 05/19/2020 CLINICAL DATA:  Central line placement EXAM: PORTABLE CHEST 1 VIEW COMPARISON:  Radiograph 05/23/2020 FINDINGS: Endotracheal tube tip terminates in the mid trachea, 3.6 cm the carina. Transesophageal tube tip and side port terminate distal to the GE junction, coiling in the stomach as demonstrated on comparison abdominal radiograph. Dual lumen right IJ approach dialysis catheter tip terminates at the superior cavoatrial junction.  Left upper extremity PICC tip terminates at a similar level. Left axillary vascular stent is again noted. Telemetry leads and external support devices overlie the chest. Some slightly diminished hazy bilateral pulmonary opacities in a perihilar predominance, could reflect improving volumes or edema. No pneumothorax or visible effusion. No new consolidative process is seen. Stable cardiomediastinal contours. No acute osseous or soft tissue abnormality. Degenerative changes are present in the imaged spine and shoulders. IMPRESSION: 1. Lines and tubes in satisfactory position. 2. Slightly diminished hazy bilateral pulmonary opacities in a perihilar predominance, could reflect improving volumes or edema. No new consolidative process. Electronically Signed   By: Lovena Le M.D.   On: 05/19/2020 00:48   DG Chest Portable 1 View  Result Date: 06/13/2020 CLINICAL DATA:  Status post intubation.  Altered mental status. EXAM: PORTABLE CHEST 1 VIEW COMPARISON:  Single-view of the chest earlier today. FINDINGS: New endotracheal tube is in good position with the tip just below the clavicular heads, 4 cm above the carina. Dialysis catheter is unchanged. Aeration is much worse than on the prior examination with hazy opacities in the upper lung zones and bases now present. No pneumothorax. Heart size is normal. IMPRESSION: ETT in good position. New hazy bilateral pulmonary opacities may be due to pulmonary edema. Electronically Signed   By: Inge Rise M.D.   On: 06/08/2020 12:45   DG Chest Portable 1 View  Result Date: 05/27/2020 CLINICAL DATA:  Altered mental status EXAM: PORTABLE CHEST 1 VIEW COMPARISON:  01/22/2020 FINDINGS: Normal heart size for technique. Dialysis catheter on the right with tip at the upper right atrium. Extensive venous stenting on the left. Mild interstitial prominence without Kerley lines or effusion. No focal consolidation. No pneumothorax. IMPRESSION: Low volume chest with borderline vascular  congestion. Electronically Signed   By: Monte Fantasia M.D.   On: 06/09/2020 08:20   EEG adult  Result Date: 06/07/2020 Greta Doom, MD     06/02/2020  9:04 PM History: 65 year old female being evaluated for altered mental status Sedation: None Technique: This is a 21 channel routine scalp EEG performed at the bedside with bipolar and monopolar montages arranged in accordance to the international 10/20 system of electrode placement. One channel was dedicated to EKG recording. Background: At the onset of recording, the background consists of generalized irregular delta and theta range activities, obscured by some muscle artifact.  There are occasional poorly formed generalized discharges with triphasic morphology.  There is a seizure with poorly localized, but appears to be left frontal, with evolution in amplitude, field, and frequency.  This is initially subclinical, part way through, her eyes open with slight rightward looking, though no forced deviation followed by deviation towards the right.  She then had versive head turning towards the right with progression to tonic-clonic seizure.  The first 3 minutes and 30 seconds of the seizure or subclinical with a total duration of 4 minutes and 32 seconds.  There is significant postictal slowing. Photic stimulation: Physiologic driving is now performed EEG Abnormalities: 1) 4-minute and 30 seconds long seizure, partial by semiology, likely left frontal in origin Clinical Interpretation: This EEG recorded a partial seizure likely left frontal in origin as described above.  The majority of the seizure was actually subclinical in nature, followed by secondary generalization with convulsion. Roland Rack, MD [facility] [phone] If 7pm- 7am, please page neurology on call as listed in [place].   DG FLUORO GUIDE LUMBAR PUNCTURE  Result Date: 06/10/2020 CLINICAL DATA:  Mental status changes.  Possible encephalitis. EXAM: DIAGNOSTIC LUMBAR  PUNCTURE UNDER FLUOROSCOPIC GUIDANCE COMPARISON:  Head CT 05/17/2020 FLUOROSCOPY TIME:  Fluoroscopy Time:  2 minutes and 30 second Radiation Exposure Index (if provided by the fluoroscopic device): Not applicable. Left Number of Acquired Spot Images: 0 PROCEDURE: The neurology service could not obtain informed consent from the patient or the patient's family prior to attempting there bedside lumbar puncture. Therefore, Dr.Kirkpatric and his colleague decided that the procedure was necessary. With the patient prone, the lower back was prepped with Betadine. 1% Lidocaine was used for local anesthesia. Lumbar puncture was performed at the L3-4 level using a 20 gauge needle with return of clear CSF with an opening pressure of 27 cm water. 10 ml of CSF were obtained for laboratory studies. The patient tolerated the procedure well and there were no apparent complications. IMPRESSION: Non complicated lumbar puncture, as detailed above. Opening pressure of 27 cm of CSF. Electronically Signed   By: Abigail Miyamoto M.D.   On: 05/14/2020 17:48   Medications: Infusions: . sodium chloride 10 mL/hr at 05/19/20 0700  . sodium chloride    . acyclovir    . fentaNYL infusion INTRAVENOUS 25 mcg/hr (05/19/20 0700)  . levETIRAcetam Stopped (05/19/20 0056)  . midazolam 2 mg/hr (05/19/20 0221)  . norepinephrine (LEVOPHED) Adult infusion 3 mcg/min (05/19/20 0700)  . propofol (DIPRIVAN) infusion 20 mcg/kg/min (05/19/20 0700)    Scheduled Medications: . chlorhexidine gluconate (MEDLINE KIT)  15 mL Mouth Rinse BID  . Chlorhexidine Gluconate Cloth  6 each Topical Daily  . [START ON 05/21/2020] darbepoetin (ARANESP) injection - DIALYSIS  60 mcg Intravenous Q Tue-HD  . docusate  100 mg Per Tube BID  . heparin  5,000 Units Subcutaneous Q8H  . mouth rinse  15 mL Mouth Rinse 10 times per day  . pantoprazole sodium  40 mg Per Tube Q1200  . polyethylene glycol  17 g Per Tube Daily    have reviewed scheduled and prn  medications.  Physical Exam: General: sedated on vent-  Continuous EEG Heart: RRR Lungs: CBS bilat Abdomen: obese, soft, non tender Extremities: pitting edema Dialysis Access: TDC     05/19/2020,7:31 AM  LOS: 1 day

## 2020-05-19 NOTE — Progress Notes (Signed)
Pt taken to MRI, and returned  To 2H room 10 without any distress noted.

## 2020-05-19 NOTE — Procedures (Signed)
Name:  Mary Wilcox MRN:  [id number] DOB:  1955-03-23  PROCEDURE NOTE  Procedure:  Central venous catheter placement.  Indications:  Need for intravenous access and hemodynamic monitoring.  Consent:  Consent was implied due to the emergency nature of the procedure.  Anesthesia:  A total of 10 mL of 1% Lidocaine was used for local infiltration anesthesia.  Procedure summary:  Appropriate equipment was assembled.  The patient was identified as [name] and safety timeout was performed. The patient was placed in Trendelenburg position.  Sterile technique was used. The patient's left anterior chest wall was prepped using chlorhexidine / alcohol scrub and the field was draped in usual sterile fashion with full body drape. After the adequate anesthesia was achieved, the left subclavian vein was cannulated with the introducer needle without difficulty. A guide wire was advanced through the introducer needle, which was then withdrawn. A small skin incision was made at the point of wire entry, the dilator was inserted over the guide wire and appropriate dilation was obtained. The dilator was removed and 7 French triple-lumen catheter was advanced over the guide wire, which was then removed.  All ports were aspirated and flushed with normal saline without difficulty. The catheter was secured into place at 19 cm. Antibiotic patch was placed and sterile dressing was applied. Post-procedure chest x-ray was ordered.  Complications:  No immediate complications were noted.  Hemodynamic parameters and oxygenation remained stable throughout the procedure.  Estimated blood loss:  Less then 5 mL.  Etheleen Nicks, MD Pulmonary and [place] Cell: [phone]  05/19/2020, 12:14 AM

## 2020-05-19 NOTE — Progress Notes (Signed)
NAME:  Mary Wilcox, MRN:  [id number], DOB:  10/29/1955, LOS: 1 ADMISSION DATE:  06/11/2020, CONSULTATION DATE:  05/19/2020  REFERRING MD:  Shelly Coss, ED PA  CHIEF COMPLAINT: Altered mental status, seizure  Brief History:  65 year old with ESRD on dialysis, NHR presented with altered mental status and hypertension, had a seizure in the ED requiring intubation for airway protection  History of Present Illness:  Resident of [facility], missed dialysis on 3/3 presented to ED with altered mental status. History obtained after review of chart since patient is currently intubated and sedated. I apparently LKN night PTA , found to be altered this morning and brought to the ED. Found to have mild leukocytosis, sodium of 119, potassium 5.5, TSH 4.5 . Neurology was consulted CT angiogram of the head and neck did not show any evidence of acute stroke. When EEG was being performed patient had seizure-like activity with sonorous breathing, given lorazepam, was emergently intubated . She was sedated with propofol, loaded with Keppra  Past Medical History:  ESRD on HD T/TH/S PAD Hypothyroidism Cervical cancer 2006 status post radiation  Significant Hospital Events:    Consults:  Renal Neurology  Procedures:  ETT 3/5 >> Lt Dalton CVL 3/6 >>  Significant Diagnostic Tests:  CT angio head/ neck >> stented left subclavian and axillary veins with stent occlusion MRI brain 3/5 >>Bilateral posterior cerebral cortical swelling and restricted diffusion, favor complicated PRES vs seizure LTM EEG 3/6 >> moderate diffuse encephalopathy CSF 3/5 >> normal   Micro Data:  CSF 3/5 >> ng  Antimicrobials:   acyclovir  Interim History / Subjective:   Developed hypotension with dialysis yesterday requiring Levophed. Central line obtained Propofol dose lowered and Versed and fentanyl started   Objective   Blood pressure 137/82, pulse 95, temperature 98.24 F (36.8 C), resp. rate 19, height 4\' 11"  (1.499 m),  weight 79.8 kg, SpO2 100 %.    Vent Mode: PRVC FiO2 (%):  [40 %-100 %] 40 % Set Rate:  [20 bmp] 20 bmp Vt Set:  [350 mL] 350 mL PEEP:  [5 cmH20] 5 cmH20 Plateau Pressure:  [17 cmH20-21 cmH20] 21 cmH20   Intake/Output Summary (Last 24 hours) at 05/19/2020 1110 Last data filed at 05/19/2020 0800 Gross per 24 hour  Intake 1032.9 ml  Output 1858 ml  Net -825.1 ml   Filed Weights   06/09/2020 1130  Weight: 79.8 kg    Examination: Gen:      elderly obese woman, no distress , intubated, sedated on propofol, on LTM EEG HEENT:  EOMI, sclera anicteric, mild pallor Neck:     No JVD; no thyromegaly Lungs:    No accessory muscle use, bilateral ventilated breath sounds CV:         Regular rate and rhythm; no murmurs Abd:      + bowel sounds; soft, non-tender; no palpable masses, no distension Ext:    No edema; adequate peripheral perfusion Skin:      Warm and dry; no rash Neuro: Sedated on propofol and Versed, RASS -2, pupils 3 mm reactive  Chest x-ray 3/6 independently reviewed, bilateral perihilar infiltrates slightly improved from 3/5, edema pattern, new central line in good position  ABG shows improved hypoxia. Labs show mild hypo natremia, improved from 3/5, mild leukocytosis and stable anemia  Resolved Hospital Problem list     Assessment & Plan:  NHR with altered mental status, hypertension , found to be hyponatremic and then had a seizure requiring intubation for airway protection Differential  diagnosis includes PR ES  Vs new onset seizure, EEG negative reassuring  Acute respiratory failure  -Wake-up assessment and start spontaneous breathing trial  Acute encephalopathy Head CT negative, MRI s/o PRES -dc acyclovir -Supportive care  Hypertensive crisis on admission, now hypotensive -Chronic hypotension on midodrine -Continue low-dose Levophed while on sedative agents   ESRD on HD, missed HD on 3/3 Hyperkalemia -Renal following, status post emergent HD 3/5    Best  practice (evaluated daily)  Diet: npo Pain/Anxiety/Delirium protocol (if indicated): Propofol /Fent , goal RASS-1 VAP protocol (if indicated): Y DVT prophylaxis: SQ heparin GI prophylaxis: protonix Glucose control: SSI Mobility: Bed rest Disposition:ICU  Goals of Care:  Last date of multidisciplinary goals of care discussion:  Code Status: Full  Labs   CBC: Recent Labs  Lab 05/16/2020 0700 06/09/2020 0845 05/21/2020 1347 06/13/2020 1558 05/19/20 0437 05/19/20 0518  WBC 15.2*  --  24.2*  --  11.8*  --   NEUTROABS 13.4*  --   --   --   --   --   HGB 10.5* 10.5* 10.2* 10.5* 7.8* 7.8*  HCT 30.5* 31.0* 29.2* 31.0* 22.4* 23.0*  MCV 89.4  --  90.1  --  88.5  --   PLT 310  --  420*  --  227  --     Basic Metabolic Panel: Recent Labs  Lab 05/24/2020 0700 05/17/2020 0845 05/17/2020 1347 05/26/2020 1557 06/09/2020 1558 05/19/20 0437 05/19/20 0518  NA 119* 118* 118* 120* 118* 129* 130*  K 5.5* 5.2* 5.8*  --  5.8* 3.5 3.7  CL 83*  --  82*  --   --  91*  --   CO2 20*  --  19*  --   --  25  --   GLUCOSE 113*  --  234*  --   --  94  --   BUN 85*  --  90*  --   --  27*  --   CREATININE 10.02*  --  10.53*  --   --  4.74*  --   CALCIUM 8.3*  --  8.2*  --   --  8.5*  --   MG  --   --  3.0*  --   --  2.2  --   PHOS  --   --  6.0*  --   --   --   --    GFR: Estimated Creatinine Clearance: 10.7 mL/min (A) (by C-G formula based on SCr of 4.74 mg/dL (H)). Recent Labs  Lab 06/03/2020 0700 06/13/2020 1347 05/19/20 0437  PROCALCITON  --  0.65  --   WBC 15.2* 24.2* 11.8*    Liver Function Tests: Recent Labs  Lab 06/04/2020 0700 06/05/2020 1347  AST 13* 21  ALT 10 11  ALKPHOS 94 103  BILITOT 0.7 0.9  PROT 6.5 6.5  ALBUMIN 3.3* 3.2*   Recent Labs  Lab 06/02/2020 1347  LIPASE 33  AMYLASE 32   Recent Labs  Lab 05/28/2020 1022  AMMONIA 16    ABG    Component Value Date/Time   PHART 7.469 (H) 05/19/2020 0518   PCO2ART 39.2 05/19/2020 0518   PO2ART 71 (L) 05/19/2020 0518   HCO3 28.6 (H)  05/19/2020 0518   TCO2 30 05/19/2020 0518   ACIDBASEDEF 2.0 06/05/2020 1558   O2SAT 95.0 05/19/2020 0518     Coagulation Profile: Recent Labs  Lab 06/09/2020 1347  INR 1.1    Cardiac Enzymes: No results for input(s): CKTOTAL, CKMB, CKMBINDEX,  TROPONINI in the last 168 hours.  HbA1C: No results found for: HGBA1C  CBG: Recent Labs  Lab 05/19/20 0437  GLUCAP 102*    Critical care time: 62m    Kara Mead MD. FCCP. [facility] Pager : [phone]  If no response to pager , please call [phone] until 7 pm After 7:00 pm call Elink  [phone]   05/19/2020

## 2020-05-20 DIAGNOSIS — I6783 Posterior reversible encephalopathy syndrome: Secondary | ICD-10-CM | POA: Diagnosis not present

## 2020-05-20 DIAGNOSIS — Z452 Encounter for adjustment and management of vascular access device: Secondary | ICD-10-CM | POA: Diagnosis not present

## 2020-05-20 DIAGNOSIS — Z0189 Encounter for other specified special examinations: Secondary | ICD-10-CM | POA: Diagnosis not present

## 2020-05-20 DIAGNOSIS — E871 Hypo-osmolality and hyponatremia: Secondary | ICD-10-CM | POA: Diagnosis not present

## 2020-05-20 DIAGNOSIS — R569 Unspecified convulsions: Secondary | ICD-10-CM | POA: Diagnosis not present

## 2020-05-20 DIAGNOSIS — N186 End stage renal disease: Secondary | ICD-10-CM | POA: Diagnosis not present

## 2020-05-20 LAB — CBC
HCT: 25.3 % — ABNORMAL LOW (ref 36.0–46.0)
Hemoglobin: 8.5 g/dL — ABNORMAL LOW (ref 12.0–15.0)
MCH: 29.5 pg (ref 26.0–34.0)
MCHC: 33.6 g/dL (ref 30.0–36.0)
MCV: 87.8 fL (ref 80.0–100.0)
Platelets: 228 10*3/uL (ref 150–400)
RBC: 2.88 MIL/uL — ABNORMAL LOW (ref 3.87–5.11)
RDW: 17.2 % — ABNORMAL HIGH (ref 11.5–15.5)
WBC: 8.4 10*3/uL (ref 4.0–10.5)
nRBC: 0 % (ref 0.0–0.2)

## 2020-05-20 LAB — RENAL FUNCTION PANEL
Albumin: 2.7 g/dL — ABNORMAL LOW (ref 3.5–5.0)
Anion gap: 16 — ABNORMAL HIGH (ref 5–15)
BUN: 38 mg/dL — ABNORMAL HIGH (ref 8–23)
CO2: 23 mmol/L (ref 22–32)
Calcium: 9.4 mg/dL (ref 8.9–10.3)
Chloride: 91 mmol/L — ABNORMAL LOW (ref 98–111)
Creatinine, Ser: 6.55 mg/dL — ABNORMAL HIGH (ref 0.44–1.00)
GFR, Estimated: 7 mL/min — ABNORMAL LOW (ref >60)
Glucose, Bld: 80 mg/dL (ref 70–99)
Phosphorus: 4.8 mg/dL — ABNORMAL HIGH (ref 2.5–4.6)
Potassium: 4.1 mmol/L (ref 3.5–5.1)
Sodium: 130 mmol/L — ABNORMAL LOW (ref 135–145)

## 2020-05-20 LAB — T4, FREE: Free T4: 0.95 ng/dL (ref 0.61–1.12)

## 2020-05-20 LAB — GLUCOSE, CAPILLARY: Glucose-Capillary: 78 mg/dL (ref 70–99)

## 2020-05-20 MED ORDER — LEVETIRACETAM IN NACL 500 MG/100ML IV SOLN: 500.0000 mg | INTRAVENOUS | Status: DC

## 2020-05-20 MED ORDER — HEPARIN SODIUM (PORCINE) 1000 UNIT/ML IJ SOLN
1000.0000 [IU] | INTRAMUSCULAR | Status: DC | PRN
  Administered 2020-05-21: 3200 [IU] via INTRAVENOUS

## 2020-05-20 MED ORDER — HEPARIN SODIUM (PORCINE) 1000 UNIT/ML IJ SOLN
INTRAMUSCULAR | Status: AC
  Administered 2020-05-20: 3200 [IU] via INTRAVENOUS
  Filled 2020-05-20: qty 4

## 2020-05-20 MED ORDER — RENA-VITE PO TABS
1.0000 | ORAL_TABLET | Freq: Every day | ORAL | Status: DC
  Administered 2020-05-20 – 2020-05-30 (×11): 1
  Filled 2020-05-20 (×11): qty 1

## 2020-05-20 MED ORDER — PROSOURCE TF PO LIQD
45.0000 mL | Freq: Three times a day (TID) | ORAL | Status: DC
  Administered 2020-05-20 – 2020-05-23 (×10): 45 mL
  Filled 2020-05-20 (×10): qty 45

## 2020-05-20 MED ORDER — VITAL 1.5 CAL PO LIQD
1000.0000 mL | ORAL | Status: DC
  Administered 2020-05-20 – 2020-05-22 (×3): 1000 mL

## 2020-05-20 MED ORDER — FENTANYL CITRATE (PF) 100 MCG/2ML IJ SOLN
25.0000 ug | INTRAMUSCULAR | Status: DC | PRN
Start: 2020-05-20 — End: 2020-05-21

## 2020-05-20 NOTE — Progress Notes (Signed)
EEG maintenance complete. No skin breakdown at Fp1 Fp2 F7 continue to monitor

## 2020-05-20 NOTE — Progress Notes (Addendum)
Initial Nutrition Assessment  DOCUMENTATION CODES:   Not applicable  INTERVENTION:   Initiate tube feeding via Cortrak: Vital 1.5 at 45 ml/h (1080 ml per day) Prosource TF 45 ml TID  Provides 1740 kcal, 106 gm protein, 825 ml free water daily  Renal MVI daily via tube  NUTRITION DIAGNOSIS:   Increased nutrient needs related to chronic illness (ESRD on HD) as evidenced by estimated needs.  GOAL:   Patient will meet greater than or equal to 90% of their needs  MONITOR:   Vent status,TF tolerance,Labs  REASON FOR ASSESSMENT:   Ventilator    ASSESSMENT:   65 yo female admitted from SNF with AMS, HTN, and had a seizure in the ED requiring intubation. PMH includes ESRD on HD, cervical cancer, secondary hyperparathyroidism, anemia, PVD, hypothyroidism.   EEG 3/7 negative for seizure activity.  Concern for PRES. Levophed is weaning.  Receiving HD at bedside.   Spoke with CCM at bedside, will order Cortrak for nutrition and start tube feedings as it may be some time before patient regains appropriate mental status.   Patient is currently intubated on ventilator support MV: 8 L/min Temp (24hrs), Avg:99.4 F (37.4 C), Min:98.1 F (36.7 C), Max:100.4 F (38 C)  Propofol: off  Labs reviewed. Sodium 130, phos 4.8 CBG: 102-78  Medications reviewed and include Aranesp, Keppra, Levophed.  Weight on admission 79.8 kg, now up to 82 kg. I/O + 904 ml since admission Weight history reviewed.  EDW 75.5 kg   NUTRITION - FOCUSED PHYSICAL EXAM:  Flowsheet Row Most Recent Value  Orbital Region No depletion  Upper Arm Region No depletion  Thoracic and Lumbar Region Unable to assess  Buccal Region Unable to assess  Temple Region Unable to assess  Clavicle Bone Region No depletion  Clavicle and Acromion Bone Region No depletion  Scapular Bone Region Unable to assess  Dorsal Hand No depletion  Patellar Region No depletion  Anterior Thigh Region No depletion  Posterior  Calf Region Unable to assess  Edema (RD Assessment) Moderate  Hair Unable to assess  Eyes Unable to assess  Mouth Unable to assess  Skin Reviewed  Nails Reviewed       Diet Order:   Diet Order            Diet NPO time specified  Diet effective now                 EDUCATION NEEDS:   No education needs have been identified at this time  Skin:  Skin Assessment: Reviewed RN Assessment  Last BM:  3/7 type 6  Height:   Ht Readings from Last 1 Encounters:  05/19/20 4\' 11"  (1.499 m)    Weight:   Wt Readings from Last 1 Encounters:  05/20/20 82 kg    Ideal Body Weight:  44.7 kg  BMI:  Body mass index is 36.51 kg/m.  Estimated Nutritional Needs:   Kcal:  1700-1900  Protein:  100-120 gm  Fluid:  1 L + UOP    Lucas Mallow, RD, LDN, CNSC Please refer to Amion for contact information.

## 2020-05-20 NOTE — Procedures (Signed)
Cortrak  Person Inserting Tube:  Maylon Peppers C, RD Tube Type:  Cortrak - 43 inches Tube Location:  Left nare Initial Placement:  Stomach Secured by: Bridle Technique Used to Measure Tube Placement:  Documented cm marking at nare/ corner of mouth Cortrak Secured At:  63 cm    Cortrak Tube Team Note:  Consult received to place a Cortrak feeding tube.   No x-ray is required. RN may begin using tube.    If the tube becomes dislodged please keep the tube and contact the Cortrak team at [email or web address] ([email or web address]) for replacement.  If after hours and replacement cannot be delayed, place a NG tube and confirm placement with an abdominal x-ray.    Lockie Pares., RD, LDN, CNSC See AMiON for contact information

## 2020-05-20 NOTE — Procedures (Addendum)
Patient Name: Mary Wilcox  MRN: [id number]  Epilepsy Attending: Lora Havens  Referring Physician/Provider: Dr Roland Rack Duration: 05/19/2020 1156 to 05/20/2020 1156  Patient history: 65 year old female being evaluated for altered mental status. EEG to evaluate for seizure  Level of alertness: Awake/lethargic, asleep  AEDs during EEG study: LEV  Technical aspects: This EEG study was done with scalp electrodes positioned according to the 10-20 International system of electrode placement. Electrical activity was acquired at a sampling rate of 500Hz  and reviewed with a high frequency filter of 70Hz  and a low frequency filter of 1Hz . EEG data were recorded continuously and digitally stored.   Description:  During awake state, no clear posterior dominant rhythm was seen. Sleep was characterized by sleep spindles (12 to 14 Hz), maximal frontocentral region. EEG showed continuous generalized 3 to 6 Hz theta-delta slowing admixed with 15-18hz  frontocentral beta activity. Periodic discharges with triphasic morphology at 0.5 to 1 Hz were noted predominantly when patient was stimulated.   ABNORMALITY -Stimulation induced rhythmic periodic discharges, generalized -Continuous slow, generalized  IMPRESSION: This study is suggestive of moderate diffuse encephalopathy, nonspecific etiology. Stimulation induced rhythmic periodic discharges were noted which is on the ictal-interictal continuum with low potential for seizures. No seizures or definite epileptiform discharges were seen throughout the recording.   Mary Wilcox

## 2020-05-20 NOTE — Progress Notes (Addendum)
Neurology Progress Note  Patient ID: NALIYA GISH is a 65 y.o. with PMHx of  has a past medical history of Bilateral hydronephrosis (2006), Cataract, Cervical cancer ([place]) (2006), Chronic kidney disease, Depression, ESRD on hemodialysis ([place]), Hypothyroidism, Insomnia, Iron deficiency anemia, Peripheral vascular disease ([place]), Secondary hyperparathyroidism (of renal origin), Transfusion history, and Wears glasses.  Initially consulted for: New onset seizures in the setting of acute encephalopathy w/ hypertensive urgency/emergency, hyponatremia, missed dialysis with uremia  Major interval events/Subjective: - off midazolam, weaning propofol / fentanyl  - did receive some boluses of midaz overnight for c/f myoclonic movements - 1 uPRBCs for hemogloblin drop - 4 L removed in iHD today  Exam: Vitals:   05/20/20 1111 05/20/20 1138  BP: (!) 103/57   Pulse: 85   Resp: 20   Temp:  (P) 98 F (36.7 C)  SpO2: 100%    Gen: In bed, comfortable  Resp: non-labored breathing, no grossly audible wheezing Cardiac: Perfusing extremities well  Abd: soft, nt Extremities: Edematous throughout   Mental Status: Patient is obtunded, today eyes open to noxious stimulation, she does not fixate or track or follow commands. Cranial Nerves: II: She did not blink to threat pupils are equal, round, and reactive to light, sluggish, 2.5 mm to 2 mm III,IV, VI: Eyes are midline, and with roving eye movements V: VII: Corneals are intact to saline XI/XII: Intact cough and gag Motor/sensory: Today she has no movement to noxious stimulation in all 4 extremities  3/5 She moved all extremities spontaneously and quasi-purposefully Cerebellar: Unable to assess secondary to patient's mental status   Pertinent Data:   Basic Metabolic Panel: Recent Labs  Lab 06/08/2020 0700 06/09/2020 0845 05/27/2020 1347 05/24/2020 1557 05/22/2020 1558 05/19/20 0437 05/19/20 0518 05/19/20 1534 05/20/20 0357  NA 119*   < > 118*    < > 118* 129* 130* 129* 130*  K 5.5*   < > 5.8*  --  5.8* 3.5 3.7  --  4.1  CL 83*  --  82*  --   --  91*  --   --  91*  CO2 20*  --  19*  --   --  25  --   --  23  GLUCOSE 113*  --  234*  --   --  94  --   --  80  BUN 85*  --  90*  --   --  27*  --   --  38*  CREATININE 10.02*  --  10.53*  --   --  4.74*  --   --  6.55*  CALCIUM 8.3*  --  8.2*  --   --  8.5*  --   --  9.4  MG  --   --  3.0*  --   --  2.2  --   --   --   PHOS  --   --  6.0*  --   --   --   --   --  4.8*   < > = values in this interval not displayed.    CBC: Recent Labs  Lab 05/19/2020 0700 06/01/2020 0845 06/12/2020 1347 06/06/2020 1558 05/19/20 0437 05/19/20 0518 05/19/20 1534 05/19/20 2200 05/20/20 0357  WBC 15.2*  --  24.2*  --  11.8*  --  9.5  --  8.4  NEUTROABS 13.4*  --   --   --   --   --   --   --   --  HGB 10.5*   < > 10.2*   < > 7.8* 7.8* 6.7* 7.6* 8.5*  HCT 30.5*   < > 29.2*   < > 22.4* 23.0* 20.5* 22.9* 25.3*  MCV 89.4  --  90.1  --  88.5  --  91.1  --  87.8  PLT 310  --  420*  --  227  --  190  --  228   < > = values in this interval not displayed.    Coagulation Studies: Recent Labs    05/27/2020 1347  LABPROT 14.1  INR 1.1        LP results: RBC 26, WBC 2, Protein 60, Glucose 95 (serum 234) Gram stain negative HSV pending  EEG 3/5: "This EEG recorded a partial seizure likely left frontal in origin as described above.  The majority of the seizure was actually subclinical in nature, followed by secondary generalization with convulsion." EEG 3/6: Generalized slowing, no seizures EEG 3/7: This study is suggestive of moderate diffuse encephalopathy, nonspecific etiology.Stimulation induced rhythmic periodic discharges were noted which is on the ictal-interictal continuum with low potential for seizures. No seizures or definite epileptiform discharges were seen throughout the recording.  MRI personally reviewed, bilateral occipital restricted diffusion w/ ADC correlate, R > L, agree with  radiology, c/f PRES, less likely seizure related change, less likely infarct   Impression: 65 year old with new onset left frontal seizures with secondary generalization, with multiple seizure threshold lowering metabolic derangements.  Exam improving today with holding sedation. MRI most consistent with PRES likely secondary to hypertensive emergency in the setting of missed dialysis. LP results reassuring overall, elevated protein can be non-specific but may also be seen early in course of viral infection so will continue acyclovir for now.  Recommendations: Prelim pending review of LTM - continue LTM EEG while weaning sedation - continue Keppra 500 mg BID, adding 500 mg post-hemodialysis to better control myoclonus - continue acyclovir renally adjusted for meningitis coverage until PCR from CSF results negative for HSV - Continue goal normotension - Appreciate excellent management of comorbidities per CCM - Neurology will continue to follow   [place] [phone]   CRITICAL CARE Performed by: Lorenza Chick  Total critical care time: 35 minutes  Critical care time was exclusive of separately billable procedures and treating other patients.  Critical care was necessary to treat or prevent imminent or life-threatening deterioration.  Critical care was time spent personally by me on the following activities: development of treatment plan with patient and/or surrogate as well as nursing, discussions with consultants, evaluation of patient's response to treatment, examination of patient, obtaining history from patient or surrogate, ordering and performing treatments and interventions, ordering and review of laboratory studies, ordering and review of radiographic studies, pulse oximetry and re-evaluation of patient's condition.

## 2020-05-20 NOTE — Progress Notes (Addendum)
Subjective:  On HD now in ICU  Objective Vital signs in last 24 hours: Vitals:   05/20/20 1045 05/20/20 1048 05/20/20 1111 05/20/20 1138  BP: 91/63 (!) 103/59 (!) 103/57   Pulse:  82 85   Resp: '18 20 20   ' Temp:  98.1 F (36.7 C)  (P) 98 F (36.7 C)  TempSrc:      SpO2: 100% 100% 100%   Weight:      Height:       Weight change: 2.167 kg  Intake/Output Summary (Last 24 hours) at 05/20/2020 1213 Last data filed at 05/20/2020 1048 Gross per 24 hour  Intake 1081.98 ml  Output 4000 ml  Net -2918.02 ml    OP HD: TTS AF  3h 47mn  75.5kg  2/2 bath Hep 7400  RIJ TDC Mircera 60 MCG q 2wks last given 05/07/20  Venofer 50 weekly  Assessment/Plan 1. HTN crisis/ PRES by MRI/ seizures: probably due to missing of OP HD. Note she does take midodrine 10 bid twice daily on HD days at home for dialysis BP support, mostly because she has great gains and has to have high level UF w/ all of her treatments.  2. Vol overload: w/ pulm edema, acute resp failure on vent. +peripheral edema, well over dry wt. Noncompliant w/ HD.  2. ESRD - HD TTS.  HD today is extra. HD tomorrow on schedule.  3. AMS/seizure activity - neuro eval /LP and MRI per neurology. MRI c/w PRES.  On keppra and continuous EEG  4. Hyponatremia - resolved  5. Anemia of ESRD - Hgb 10.5, then down to 7's.   Will give ESA,  Venofer weekly hold iron until infection can be ruled out 6. Metabolic bone disease - calcium 8.3 no vitamin D at outpatient unit, continue calcium acetate and Sensipar when taking p.o.'s 7. Nutrition -ALB 3.3, currently n.p.o.    RKelly Splinter MD 05/20/2020, 12:22 PM       Labs: Basic Metabolic Panel: Recent Labs  Lab 05/19/2020 1347 06/12/2020 1557 05/19/20 0437 05/19/20 0518 05/19/20 1534 05/20/20 0357  NA 118*   < > 129* 130* 129* 130*  K 5.8*   < > 3.5 3.7  --  4.1  CL 82*  --  91*  --   --  91*  CO2 19*  --  25  --   --  23  GLUCOSE 234*  --  94  --   --  80  BUN 90*  --  27*  --   --  38*   CREATININE 10.53*  --  4.74*  --   --  6.55*  CALCIUM 8.2*  --  8.5*  --   --  9.4  PHOS 6.0*  --   --   --   --  4.8*   < > = values in this interval not displayed.   Liver Function Tests: Recent Labs  Lab 06/06/2020 0700 05/23/2020 1347 05/20/20 0357  AST 13* 21  --   ALT 10 11  --   ALKPHOS 94 103  --   BILITOT 0.7 0.9  --   PROT 6.5 6.5  --   ALBUMIN 3.3* 3.2* 2.7*   Recent Labs  Lab 05/26/2020 1347  LIPASE 33  AMYLASE 32   Recent Labs  Lab 05/17/2020 1022  AMMONIA 16   CBC: Recent Labs  Lab 05/25/2020 0700 05/25/2020 0845 06/03/2020 1347 05/24/2020 1558 05/19/20 0437 05/19/20 0518 05/19/20 1534 05/19/20 2200 05/20/20 0357  WBC 15.2*  --  24.2*  --  11.8*  --  9.5  --  8.4  NEUTROABS 13.4*  --   --   --   --   --   --   --   --   HGB 10.5*   < > 10.2*   < > 7.8*   < > 6.7* 7.6* 8.5*  HCT 30.5*   < > 29.2*   < > 22.4*   < > 20.5* 22.9* 25.3*  MCV 89.4  --  90.1  --  88.5  --  91.1  --  87.8  PLT 310  --  420*  --  227  --  190  --  228   < > = values in this interval not displayed.   Cardiac Enzymes: No results for input(s): CKTOTAL, CKMB, CKMBINDEX, TROPONINI in the last 168 hours. CBG: Recent Labs  Lab 05/19/20 0437 05/20/20 0357  GLUCAP 102* 78    Iron Studies: No results for input(s): IRON, TIBC, TRANSFERRIN, FERRITIN in the last 72 hours. Studies/Results: DG Abd 1 View  Result Date: 05/23/2020 CLINICAL DATA:  OG tube placement. EXAM: ABDOMEN - 1 VIEW COMPARISON:  None. FINDINGS: The enteric tube is looped in the stomach, tip and side ports in the region of the gastric fundus. There is no bowel dilatation to suggest obstruction. Presumed gallstones in the right upper quadrant. Advanced vascular calcifications. Vascular stent in the left axilla partially included. IMPRESSION: Enteric tube looped in the stomach with tip and side-port in the region of the gastric fundus. Electronically Signed   By: Keith Rake M.D.   On: 06/05/2020 18:33   MR BRAIN WO  CONTRAST  Result Date: 05/19/2020 CLINICAL DATA:  Seizure-like activity.  History of trauma. EXAM: MRI HEAD WITHOUT CONTRAST TECHNIQUE: Multiplanar, multiecho pulse sequences of the brain and surrounding structures were obtained without intravenous contrast. COMPARISON:  CT a of the head neck from yesterday FINDINGS: Brain: Bilateral posterior cerebral cortical FLAIR and diffusion hyperintensity mainly in the parietooccipital convexities and mildly extending into the posterior right temporal convexity. Given the symmetry and prior CTA, arterial occlusive disease is unlikely. The dural sinuses show normal flow voids. White matter and deep gray nuclei are spared and there is no reported history of fever to implicate cerebritis. No anterior involvement typical of watershed pattern and no thalamic involvement as sometimes seen with seizure phenomenon. No hemorrhage, hydrocephalus, mass, or collection. Vascular: Preserved flow voids Skull and upper cervical spine: Hypointense marrow diffusely, likely related to patient's end-stage renal disease. Presumed periapical granuloma associated with a left upper canine or premolar. Sinuses/Orbits: Negative IMPRESSION: Bilateral posterior cerebral cortical swelling and restricted diffusion, favor complicated posterior reversible encephalopathy syndrome or seizure phenomenon. Electronically Signed   By: Monte Fantasia M.D.   On: 05/19/2020 04:13   DG CHEST PORT 1 VIEW  Result Date: 05/19/2020 CLINICAL DATA:  Central line placement EXAM: PORTABLE CHEST 1 VIEW COMPARISON:  Radiograph 06/12/2020 FINDINGS: Endotracheal tube tip terminates in the mid trachea, 3.6 cm the carina. Transesophageal tube tip and side port terminate distal to the GE junction, coiling in the stomach as demonstrated on comparison abdominal radiograph. Dual lumen right IJ approach dialysis catheter tip terminates at the superior cavoatrial junction. Left upper extremity PICC tip terminates at a similar  level. Left axillary vascular stent is again noted. Telemetry leads and external support devices overlie the chest. Some slightly diminished hazy bilateral pulmonary opacities in a perihilar predominance, could reflect improving volumes or edema. No pneumothorax or visible  effusion. No new consolidative process is seen. Stable cardiomediastinal contours. No acute osseous or soft tissue abnormality. Degenerative changes are present in the imaged spine and shoulders. IMPRESSION: 1. Lines and tubes in satisfactory position. 2. Slightly diminished hazy bilateral pulmonary opacities in a perihilar predominance, could reflect improving volumes or edema. No new consolidative process. Electronically Signed   By: Lovena Le M.D.   On: 05/19/2020 00:48   DG Chest Portable 1 View  Result Date: 06/06/2020 CLINICAL DATA:  Status post intubation.  Altered mental status. EXAM: PORTABLE CHEST 1 VIEW COMPARISON:  Single-view of the chest earlier today. FINDINGS: New endotracheal tube is in good position with the tip just below the clavicular heads, 4 cm above the carina. Dialysis catheter is unchanged. Aeration is much worse than on the prior examination with hazy opacities in the upper lung zones and bases now present. No pneumothorax. Heart size is normal. IMPRESSION: ETT in good position. New hazy bilateral pulmonary opacities may be due to pulmonary edema. Electronically Signed   By: Inge Rise M.D.   On: 06/07/2020 12:45   Overnight EEG with video  Result Date: 05/19/2020 Lora Havens, MD     05/20/2020  9:16 AM Patient Name: ILDA LASKIN MRN: [id number] Epilepsy Attending: Lora Havens Referring Physician/Provider: Dr Roland Rack Duration: 05/30/2020 1156 to 05/19/2020 1156 Patient history:  65 year old female being evaluated for altered mental status. EEG to evaluate for seizure Level of alertness:comatose AEDs during EEG study: LEV, versed, propofol Technical aspects: This EEG study was done with  scalp electrodes positioned according to the 10-20 International system of electrode placement. Electrical activity was acquired at a sampling rate of '500Hz'  and reviewed with a high frequency filter of '70Hz'  and a low frequency filter of '1Hz' . EEG data were recorded continuously and digitally stored. Description:  EEG showed continuous generalized 3 to 6 Hz theta-delta slowing admixed with 15-'18hz'  frontocentral beta activity. Hyperventilation and photic stimulation were not performed.  Event button was pressed on 05/19/2020 at 1055 during which patient was noted to have subtle left> right upper extremity twitching.  Concomitant EEG before, during and after the event did not show any change in seizure. Parts of study were difficult to interpret due to significant electrode artifact. ABNORMALITY -Continuous slow, generalized IMPRESSION: This study is suggestive of moderate diffuse encephalopathy, nonspecific etiology. No seizures or epileptiform discharges were seen throughout the recording. One event was recorded on 05/19/2020 at 1055 as described above without concomitant EEG change and was most likely not an epileptic event. Lora Havens   DG FLUORO GUIDE LUMBAR PUNCTURE  Result Date: 06/03/2020 CLINICAL DATA:  Mental status changes.  Possible encephalitis. EXAM: DIAGNOSTIC LUMBAR PUNCTURE UNDER FLUOROSCOPIC GUIDANCE COMPARISON:  Head CT 06/01/2020 FLUOROSCOPY TIME:  Fluoroscopy Time:  2 minutes and 30 second Radiation Exposure Index (if provided by the fluoroscopic device): Not applicable. Left Number of Acquired Spot Images: 0 PROCEDURE: The neurology service could not obtain informed consent from the patient or the patient's family prior to attempting there bedside lumbar puncture. Therefore, Dr.Kirkpatric and his colleague decided that the procedure was necessary. With the patient prone, the lower back was prepped with Betadine. 1% Lidocaine was used for local anesthesia. Lumbar puncture was performed at the  L3-4 level using a 20 gauge needle with return of clear CSF with an opening pressure of 27 cm water. 10 ml of CSF were obtained for laboratory studies. The patient tolerated the procedure well and there were no apparent complications. IMPRESSION:  Non complicated lumbar puncture, as detailed above. Opening pressure of 27 cm of CSF. Electronically Signed   By: Abigail Miyamoto M.D.   On: 05/21/2020 17:48   Medications: Infusions: . sodium chloride 10 mL/hr at 05/20/20 0900  . sodium chloride    . acyclovir Stopped (05/19/20 1532)  . feeding supplement (VITAL 1.5 CAL)    . levETIRAcetam Stopped (05/19/20 2349)  . norepinephrine (LEVOPHED) Adult infusion 5 mcg/min (05/20/20 0900)  . propofol (DIPRIVAN) infusion Stopped (05/19/20 1310)    Scheduled Medications: . chlorhexidine gluconate (MEDLINE KIT)  15 mL Mouth Rinse BID  . Chlorhexidine Gluconate Cloth  6 each Topical Daily  . Chlorhexidine Gluconate Cloth  6 each Topical Q0600  . [START ON 05/21/2020] darbepoetin (ARANESP) injection - DIALYSIS  150 mcg Intravenous Q Tue-HD  . docusate  100 mg Per Tube BID  . feeding supplement (PROSource TF)  45 mL Per Tube TID  . mouth rinse  15 mL Mouth Rinse 10 times per day  . multivitamin  1 tablet Per Tube QHS  . pantoprazole sodium  40 mg Per Tube Q1200  . polyethylene glycol  17 g Per Tube Daily    have reviewed scheduled and prn medications.  Physical Exam: General: sedated on vent-  Continuous EEG Heart: RRR Lungs: CBS bilat Abdomen: obese, soft, non tender Extremities: pitting edema Dialysis Access: [facility]     05/20/2020,12:13 PM  LOS: 2 days

## 2020-05-20 NOTE — Progress Notes (Signed)
NAME:  Mary Wilcox, MRN:  [id number], DOB:  [birth year], LOS: 2 ADMISSION DATE:  05/28/2020, CONSULTATION DATE:  05/20/2020  REFERRING MD:  Shelly Coss, ED PA  CHIEF COMPLAINT: Altered mental status, seizure  Brief History:  65 year old with ESRD on dialysis, NHR presented with altered mental status and hypertension, had a seizure in the ED requiring intubation for airway protection  Past Medical History:  ESRD on HD T/TH/S PAD Hypothyroidism Cervical cancer 2006 status post radiation  Significant Hospital Events:  3/5 5 admitted with mental status change.  Had witnessed seizure while getting EEG requiring intubation during propofol load and Keppra load 3/6:MRI, pleated showing bilateral posterior cerebral cortical swelling and restricted diffusion fever and complicated posterior reversible encephalopathy syndrome or seizure phenomenon.  Got hypotensive during dialysis required left subclavian triple-lumen catheter placement.  Started on norepinephrine.   3/7: EEG in the morning negative for seizure activity showing diffuse encephalopathy.  Still heavily sedated with GCS 3.  Weaning sedating medications.  Getting dialysis.  Weaning norepinephrine still hypotensive.  Placing core track for nutrition   Consults:  Renal Neurology  Procedures:  ETT 3/5 >> Lt [place] CVL 3/6 >>  Significant Diagnostic Tests:  CT angio head/ neck >> stented left subclavian and axillary veins with stent occlusion MRI brain 3/5 >>Bilateral posterior cerebral cortical swelling and restricted diffusion, favor complicated PRES vs seizure LTM EEG 3/6 >> moderate diffuse encephalopathy CSF 3/5 >> normal   Micro Data:  CSF 3/5 >> ng  Antimicrobials:   acyclovir  Interim History / Subjective:  Not responsive.  Currently on low-dose norepinephrine  Objective   Blood pressure 105/60, pulse 81, temperature 98.6 F (37 C), resp. rate (Abnormal) 25, height 4\' 11"  (1.499 m), weight 82 kg, SpO2 100 %.    Vent  Mode: PRVC FiO2 (%):  [40 %] 40 % Set Rate:  [20 bmp] 20 bmp Vt Set:  [350 mL] 350 mL PEEP:  [5 cmH20] 5 cmH20 Pressure Support:  [15 cmH20] 15 cmH20 Plateau Pressure:  [16 cmH20-17 cmH20] 16 cmH20   Intake/Output Summary (Last 24 hours) at 05/20/2020 1014 Last data filed at 05/20/2020 0900 Gross per 24 hour  Intake 1192.68 ml  Output no documentation  Net 1192.68 ml   Filed Weights   06/07/2020 1130 05/20/20 0500 05/20/20 0700  Weight: 79.8 kg 82 kg 82 kg    Examination: General this is a 65 year old white female she is currently sedated heavily and minimally responsive HEENT normocephalic atraumatic EEG leads in place orally intubated no JVD pupils equal reactive Pulmonary coarse scattered rhonchi no accessory use currently on PEEP 5 FiO2 40% saturations 100 Cardiac: Regular rate and rhythm Abdomen: Soft nontender Extremities: Warm dry dependent edema Neuro: GCS 3 GU: Anuric   Resolved Hospital Problem list   Hyperkalemia, resolved with urgent dialysis on 3/5  Assessment & Plan:    Acute metabolic encephalopathy with seizure in the setting of PRES  -Seen by neurology, we appreciate their assistance -LP data negative today -Most recent EEG negative for seizure activity Plan Continue Keppra 500 mg twice daily Continuing renal dosed acyclovir as we await HSV from CSF PCR Continuing LTM as we wean Versed and fentanyl Avoid fevers Serial neuro checks Avoid hypertension, continue normotension goal  Acute respiratory failure in the setting of ineffective airway protection with seizure Portable chest x-ray personally reviewed from 3/6 showed some bilateral hazy changes consistent with what appears to be volume overload/pulmonary edema Currently her mental status is barrier to weaning Plan Discontinue  fentanyl and Versed drip Continue PAD protocol, RASS goal 0 VAP bundle Volume removal with intermittent dialysis A.m. chest x-ray  Hypertensive crisis on admission, now  hypotensive, has history of chronic hypotension on midodrine Plan Continue to titrate norepinephrine for systolic blood pressure greater than 90 Continue midodrine   ESRD on HD, missed HD on 3/3 Plan Continue intermittent hemodialysis  Anemia.  Etiology not clear no obvious source of blood loss.  Received 1 unit PRBCs on 3/6 Plan Trend CBC Holding anticoagulation Transfuse for hemoglobin less than 7  Best practice (evaluated daily)  Diet: npo, ordering core track 3/7 and initiating tube feeds Pain/Anxiety/Delirium protocol (if indicated): Propofol /Fent , goal RASS-1,Changing RASS goal to 0 VAP protocol (if indicated): Y DVT prophylaxis: SQ heparin GI prophylaxis: protonix Glucose control: SSI Mobility: Bed rest Disposition:ICU  Goals of Care:  Last date of multidisciplinary goals of care discussion: Still pending Code Status: Full  My critical care time is 35 minutes  Erick Colace ACNP-BC [place] Pager # [phone] OR # [phone] if no answer

## 2020-05-21 ENCOUNTER — Inpatient Hospital Stay (HOSPITAL_COMMUNITY): Payer: Medicare Other

## 2020-05-21 ENCOUNTER — Other Ambulatory Visit: Payer: Self-pay

## 2020-05-21 DIAGNOSIS — J9601 Acute respiratory failure with hypoxia: Secondary | ICD-10-CM | POA: Diagnosis not present

## 2020-05-21 DIAGNOSIS — R569 Unspecified convulsions: Secondary | ICD-10-CM | POA: Diagnosis not present

## 2020-05-21 LAB — RENAL FUNCTION PANEL
Albumin: 2.2 g/dL — ABNORMAL LOW (ref 3.5–5.0)
Anion gap: 10 (ref 5–15)
BUN: 29 mg/dL — ABNORMAL HIGH (ref 8–23)
CO2: 26 mmol/L (ref 22–32)
Calcium: 9.3 mg/dL (ref 8.9–10.3)
Chloride: 97 mmol/L — ABNORMAL LOW (ref 98–111)
Creatinine, Ser: 4.69 mg/dL — ABNORMAL HIGH (ref 0.44–1.00)
GFR, Estimated: 10 mL/min — ABNORMAL LOW (ref >60)
Glucose, Bld: 116 mg/dL — ABNORMAL HIGH (ref 70–99)
Phosphorus: 4.1 mg/dL (ref 2.5–4.6)
Potassium: 3.6 mmol/L (ref 3.5–5.1)
Sodium: 133 mmol/L — ABNORMAL LOW (ref 135–145)

## 2020-05-21 LAB — GLUCOSE, CAPILLARY
Glucose-Capillary: 120 mg/dL — ABNORMAL HIGH (ref 70–99)
Glucose-Capillary: 130 mg/dL — ABNORMAL HIGH (ref 70–99)
Glucose-Capillary: 138 mg/dL — ABNORMAL HIGH (ref 70–99)
Glucose-Capillary: 151 mg/dL — ABNORMAL HIGH (ref 70–99)
Glucose-Capillary: 163 mg/dL — ABNORMAL HIGH (ref 70–99)
Glucose-Capillary: 91 mg/dL (ref 70–99)
Glucose-Capillary: 99 mg/dL (ref 70–99)

## 2020-05-21 LAB — CBC
HCT: 21 % — ABNORMAL LOW (ref 36.0–46.0)
Hemoglobin: 7 g/dL — ABNORMAL LOW (ref 12.0–15.0)
MCH: 29.4 pg (ref 26.0–34.0)
MCHC: 33.3 g/dL (ref 30.0–36.0)
MCV: 88.2 fL (ref 80.0–100.0)
Platelets: 281 10*3/uL (ref 150–400)
RBC: 2.38 MIL/uL — ABNORMAL LOW (ref 3.87–5.11)
RDW: 17.4 % — ABNORMAL HIGH (ref 11.5–15.5)
WBC: 8.1 10*3/uL (ref 4.0–10.5)
nRBC: 0 % (ref 0.0–0.2)

## 2020-05-21 LAB — T3, FREE: T3, Free: 1.4 pg/mL — ABNORMAL LOW (ref 2.0–4.4)

## 2020-05-21 LAB — HEMOGLOBIN A1C
Hgb A1c MFr Bld: 4.9 % (ref 4.8–5.6)
Mean Plasma Glucose: 93.93 mg/dL

## 2020-05-21 LAB — PREPARE RBC (CROSSMATCH)

## 2020-05-21 LAB — HSV 1/2 PCR, CSF
HSV-1 DNA: NEGATIVE
HSV-2 DNA: NEGATIVE

## 2020-05-21 IMAGING — DX DG CHEST 1V PORT
1 series · 1 of 1 positions shown · non-contrast
Comparison: [DATE].

CLINICAL DATA: Intubation.  Respiratory failure.

EXAM:
PORTABLE CHEST 1 VIEW

[chest ap]
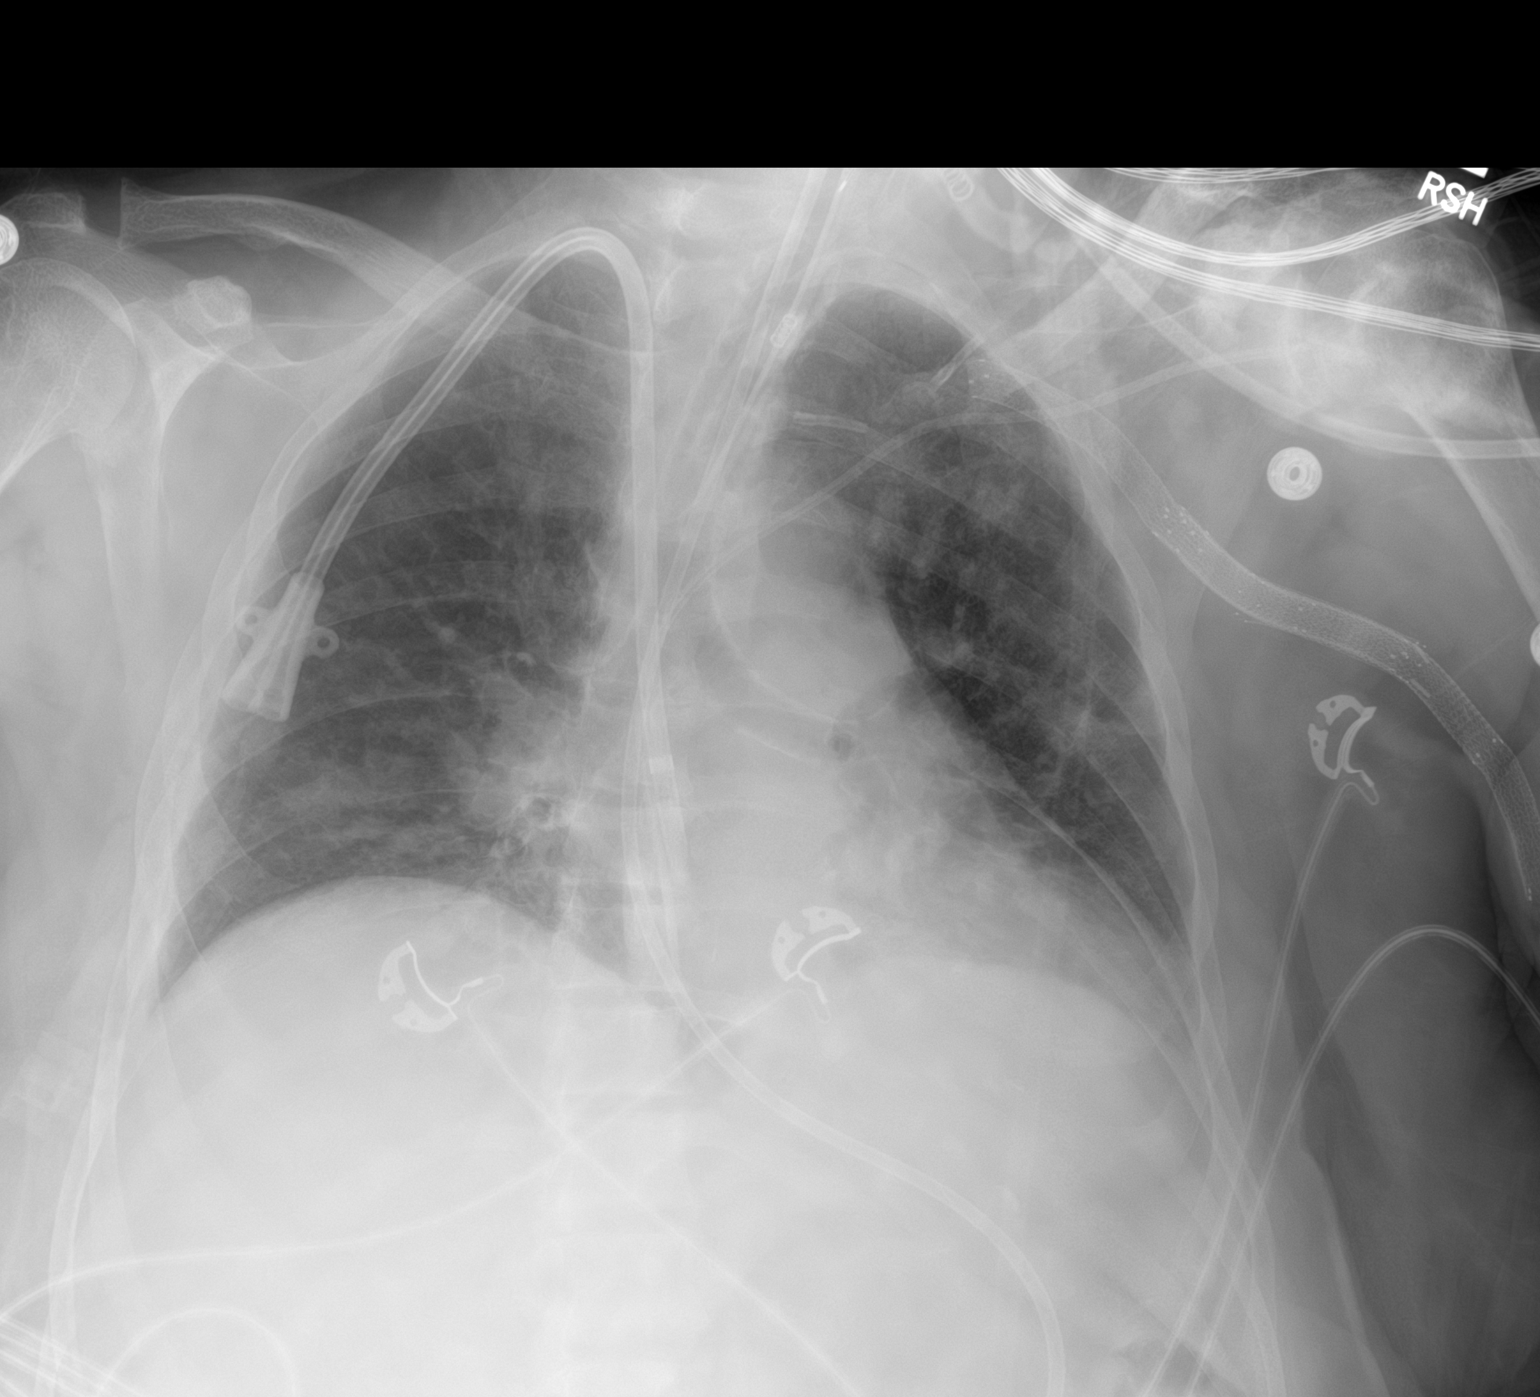

[1 of 1 positions shown; findings below may reference images not displayed]

FINDINGS: Endotracheal tube, feeding tube, right dual-lumen catheter, left
central line in stable position. Heart size stable low lung volumes
with persistent bibasilar and left upper mild
atelectasis/infiltrates. No pleural effusion or pneumothorax. Left
upper extremity vascular stents again noted
IMPRESSION: 1. Lines and tubes in stable position.
2. Low lung volumes with persistent bibasilar and left upper lobe
atelectasis/infiltrates.

## 2020-05-21 MED ORDER — HEPARIN SODIUM (PORCINE) 1000 UNIT/ML DIALYSIS
3500.0000 [IU] | INTRAMUSCULAR | Status: DC | PRN
  Administered 2020-05-21: 3500 [IU] via INTRAVENOUS_CENTRAL
  Filled 2020-05-21: qty 4

## 2020-05-21 MED ORDER — LABETALOL HCL 5 MG/ML IV SOLN
10.0000 mg | Freq: Four times a day (QID) | INTRAVENOUS | Status: DC | PRN
  Administered 2020-05-21 – 2020-05-28 (×4): 10 mg via INTRAVENOUS
  Filled 2020-05-21 (×5): qty 4

## 2020-05-21 MED ORDER — CLEVIDIPINE BUTYRATE 0.5 MG/ML IV EMUL
0.0000 mg/h | INTRAVENOUS | Status: DC
  Administered 2020-05-21 – 2020-05-22 (×2): 1 mg/h via INTRAVENOUS
  Filled 2020-05-21 (×3): qty 50

## 2020-05-21 MED ORDER — INSULIN ASPART 100 UNIT/ML ~~LOC~~ SOLN
0.0000 [IU] | SUBCUTANEOUS | Status: DC
  Administered 2020-05-21 – 2020-05-31 (×14): 1 [IU] via SUBCUTANEOUS

## 2020-05-21 MED ORDER — FENTANYL CITRATE (PF) 100 MCG/2ML IJ SOLN
12.5000 ug | INTRAMUSCULAR | Status: DC | PRN
  Administered 2020-05-22 – 2020-05-25 (×10): 12.5 ug via INTRAVENOUS
  Filled 2020-05-21 (×11): qty 2

## 2020-05-21 MED ORDER — HEPARIN SODIUM (PORCINE) 1000 UNIT/ML IJ SOLN
INTRAMUSCULAR | Status: AC
  Filled 2020-05-21: qty 4

## 2020-05-21 MED ORDER — SODIUM CHLORIDE 0.9% IV SOLUTION: Freq: Once | INTRAVENOUS | Status: AC

## 2020-05-21 MED ORDER — LEVETIRACETAM IN NACL 500 MG/100ML IV SOLN: 500.0000 mg | INTRAVENOUS | Status: DC

## 2020-05-21 MED ORDER — DARBEPOETIN ALFA 150 MCG/0.3ML IJ SOSY
PREFILLED_SYRINGE | INTRAMUSCULAR | Status: AC
  Filled 2020-05-21: qty 0.3

## 2020-05-21 MED ORDER — ACETAMINOPHEN 160 MG/5ML PO SOLN
650.0000 mg | Freq: Four times a day (QID) | ORAL | Status: DC | PRN
  Administered 2020-05-21 – 2020-05-31 (×5): 650 mg
  Filled 2020-05-21 (×6): qty 20.3

## 2020-05-21 NOTE — Progress Notes (Signed)
[place] Progress Note Patient Name: VONNETTA AKEY DOB: 30-Jan-1956 MRN: [id number]   Date of Service  05/21/2020  HPI/Events of Note  Hyperglycemia - Blood glucose = 138 --> 163. ESRD.  eICU Interventions  Plan: 1. Q 4 hour very sensitive Novolog SSI.      Intervention Category Major Interventions: Hyperglycemia - active titration of insulin therapy  Lysle Dingwall 05/21/2020, 8:23 PM

## 2020-05-21 NOTE — Progress Notes (Signed)
NAME:  Mary Wilcox, MRN:  [id number], DOB:  27-Jan-1956, LOS: 3 ADMISSION DATE:  05/26/2020, CONSULTATION DATE:  05/21/2020  REFERRING MD:  Shelly Coss, ED PA  CHIEF COMPLAINT: Altered mental status, seizure  Brief History:  65 year old with ESRD on dialysis, NHR presented with altered mental status and hypertension, had a seizure in the ED requiring intubation for airway protection  Past Medical History:  ESRD on HD T/TH/S PAD Hypothyroidism Cervical cancer 2006 status post radiation  Significant Hospital Events:  3/5 5 admitted with mental status change.  Had witnessed seizure while getting EEG requiring intubation during propofol load and Keppra load 3/6:MRI, pleated showing bilateral posterior cerebral cortical swelling and restricted diffusion fever and complicated posterior reversible encephalopathy syndrome or seizure phenomenon.  Got hypotensive during dialysis required left subclavian triple-lumen catheter placement.  Started on norepinephrine.   3/7: EEG negative for seizure, activity showing diffuse encephalopathy. GCS 3.  Weaning sedating medications.  Getting dialysis.  Weaning norepinephrine still hypotensive. core track for nutrition. got extra treatment of dialysis 3/8: EEG still negative for seizure but showing diffuse slowing.  Grimaces and opens eyes to noxious stimulus but still not following commands.  Failed pressure support attempts.  Sputum culture sent  Consults:  Renal Neurology  Procedures:  ETT 3/5 >> Lt [place] CVL 3/6 >>  Significant Diagnostic Tests:  CT angio head/ neck >> stented left subclavian and axillary veins with stent occlusion MRI brain 3/5 >>Bilateral posterior cerebral cortical swelling and restricted diffusion, favor complicated PRES vs seizure LTM EEG 3/6 >> moderate diffuse encephalopathy CSF 3/5 >> normal   Micro Data:  CSF 3/5 >> ng  Antimicrobials:   acyclovir  Interim History / Subjective:  About the same  Objective   Blood  pressure (Abnormal) 170/70, pulse (Abnormal) 120, temperature 98.42 F (36.9 C), resp. rate (Abnormal) 28, height 4\' 11"  (1.499 m), weight 82 kg, SpO2 100 %.    Vent Mode: PRVC FiO2 (%):  [30 %] 30 % Set Rate:  [20 bmp] 20 bmp Vt Set:  [350 mL] 350 mL PEEP:  [5 cmH20] 5 cmH20 Plateau Pressure:  [14 cmH20-16 cmH20] 16 cmH20   Intake/Output Summary (Last 24 hours) at 05/21/2020 0919 Last data filed at 05/21/2020 0700 Gross per 24 hour  Intake 816.46 ml  Output 4000 ml  Net -3183.54 ml   Filed Weights   06/01/2020 1130 05/20/20 0500 05/20/20 0700  Weight: 79.8 kg 82 kg 82 kg    Examination: General: 65 year old female remains minimally responsive.  She does at least respond to painful stimulus today HEENT normocephalic atraumatic orally intubated EEG hardware in place Pulmonary: Coarse scattered rhonchi, became tachypneic when attempted pressure support ventilation Cardiac: Regular rate and rhythm Abdomen: Soft nontender Extremities: Warm dry, diffuse edema, pulses palpable brisk brisk capillary refill Neuro: Opens eyes to painful stimulus only.  No peripheral movement.  With the exception of mild tremor during painful/noxious stimulus GU: [place] [facility] Problem list   Hyperkalemia, resolved with urgent dialysis on 3/5  Assessment & Plan:    Acute metabolic encephalopathy with seizure in the setting of PRES  -Seen by neurology, we appreciate their assistance -LP data negative today -Most recent EEG negative for seizure activity Plan Continue Keppra Awaiting HSV PCR, will continue acyclovir up until this is resulted and confirmed to be negative LTM per neurology Serial neuro checks Avoid fever Support hemodynamics Minimize sedation, has been off drips since 3/7   Acute respiratory failure in the setting of ineffective  airway protection with seizure Portable chest x-ray personally reviewed this shows endotracheal tube in satisfactory position HD catheter  satisfactory feeding tube coursing below the level of the diaphragm. Has some patch upper lobe changes no sig change  Failed SBT attempt and pressure support ventilation assessment however at this point mental status would be barrier to extubation anyhow Plan Continue full ventilator support at this point. Check respiratory culture VAP bundle PAD protocol, RASS goal 0 Volume removal with CRRT as tolerated Repeat a.m. chest x-ray  Hypertensive crisis on admission, now hypotensive, has history of chronic hypotension on midodrine Plan Continue midodrine Goal blood pressure greater than 90   ESRD on HD, missed HD on 3/3 Plan Intermittent dialysis, got an extra dose of dialysis on 3/7 plan is to repeat this again on 3/8 per her normal TTS schedule  Anemia.  Etiology not clear no obvious source of blood loss.  Received 1 unit PRBCs on 3/6.  Hemoglobin 7 today.  I still do not see evidence of active bleeding but her hemoglobin is all over the place Plan Repeat CBC in a.m. Continue to hold anticoagulation Suspect she will need transfusion again we will see what her hemoglobin looks like in the morning  Best practice (evaluated daily)  Diet: npo, ordering core track 3/7 and initiating tube feeds Pain/Anxiety/Delirium protocol (if indicated): Propofol /Fent , goal RASS-1,Changing RASS goal to 0 VAP protocol (if indicated): Y DVT prophylaxis: SQ heparin-->hold  GI prophylaxis: protonix Glucose control: SSI Mobility: Bed rest Disposition:ICU  Goals of Care:  Last date of multidisciplinary goals of care discussion: Still pending Code Status: Full  My critical care time is 79min  Erick Colace ACNP-BC [place] Pager # [phone] OR # [phone] if no answer

## 2020-05-21 NOTE — Procedures (Addendum)
Patient Name:Elon QUANTAVIA FRITH ZFP:[id number] Epilepsy Attending:Jacorian Golaszewski Barbra Sarks Referring Physician/Provider:Dr Roland Rack Duration:05/20/2020 1156 to 05/21/2020 1156  Patient history:65 year old female being evaluated for altered mental status. EEG to evaluate for seizure  Level of alertness: Awake/lethargic, asleep  AEDs during EEG study:LEV  Technical aspects: This EEG study was done with scalp electrodes positioned according to the 10-20 International system of electrode placement. Electrical activity was acquired at a sampling rate of 500Hz  and reviewed with a high frequency filter of 70Hz  and a low frequency filter of 1Hz . EEG data were recorded continuously and digitally stored.   Description: During awake state, no clear posterior dominant rhythm was seen. Sleep was characterized by sleep spindles (12 to 14 Hz), maximal frontocentral region. EEG showed continuous generalized 3 to 6 Hz theta-delta slowing admixed with 15-18hz  frontocentral beta activity.Periodic discharges with triphasic morphology at 0.5 to 1 Hz were noted predominantly when patient was stimulated.   ABNORMALITY -Stimulation induced rhythmic periodic discharges, generalized -Continuousslow, generalized  IMPRESSION: This study is suggestive of moderate diffuse encephalopathy, nonspecific etiology.Stimulation induced rhythmic periodic discharges were noted which is on the ictal-interictal continuum with low potential for seizures. No seizures or definite epileptiform discharges were seen throughout the recording.  EEG appears similar to yesterday.  Janiaya Ryser Barbra Sarks

## 2020-05-21 NOTE — Progress Notes (Signed)
Subjective: seen in ICU, not responsive  Objective Vital signs in last 24 hours: Vitals:   05/21/20 1245 05/21/20 1250 05/21/20 1300 05/21/20 1315  BP: (!) 175/87 132/69 (!) 154/93 (!) 149/69  Pulse: 97 90 89 94  Resp: 20 (!) 24 20 (!) 25  Temp: 99.14 F (37.3 C) 99.14 F (37.3 C) 98.78 F (37.1 C) 98.96 F (37.2 C)  TempSrc:   Esophageal Esophageal  SpO2: 100%  100% 100%  Weight:      Height:       Weight change:   Intake/Output Summary (Last 24 hours) at 05/21/2020 1331 Last data filed at 05/21/2020 1300 Gross per 24 hour  Intake 1130.43 ml  Output -  Net 1130.43 ml    OP HD: TTS AF  3h 60mn  75.5kg  2/2 bath Hep 7400  RIJ TDC Mircera 60 MCG q 2wks last given 05/07/20  Venofer 50 weekly  Assessment/Plan 1. HTN crisis/ PRES by MRI/ seizures: probably due to missing OP HD. Note she does take midodrine 10 bid twice daily on HD days at home for dialysis BP support due to very large fluid gains between HD sessions.  2. Vol overload: w/ pulm edema, acute resp failure on vent. +peripheral edema, well over dry wt. Noncompliant w/ HD.  2. ESRD - HD TTS.  HD yest w/ 4L off. HD again today w/ large UF goal.  3. AMS/seizure activity - neuro eval /LP and MRI per neurology. MRI c/w PRES.  On keppra and continuous EEG  4. Anemia of ESRD - Hgb 10.5, then down to 7.0 today, prbc's ordered by primary team. SP darbe 150ug on 3/08 today and weekly on Tuesday while here. Venofer on hold until infection can be ruled out 5. Metabolic bone disease - calcium 8.3 no vitamin D at outpatient unit, continue calcium acetate and Sensipar when taking p.o.'s 6. Nutrition -ALB 3.3, currently n.p.o.    RKelly Splinter MD 05/21/2020, 1:31 PM       Labs: Basic Metabolic Panel: Recent Labs  Lab 06/07/2020 1347 05/23/2020 1557 05/19/20 0143803/06/22 0518 05/19/20 1534 05/20/20 0357 05/21/20 0347  NA 118*   < > 129* 130* 129* 130* 133*  K 5.8*   < > 3.5 3.7  --  4.1 3.6  CL 82*  --  91*  --   --   91* 97*  CO2 19*  --  25  --   --  23 26  GLUCOSE 234*  --  94  --   --  80 116*  BUN 90*  --  27*  --   --  38* 29*  CREATININE 10.53*  --  4.74*  --   --  6.55* 4.69*  CALCIUM 8.2*  --  8.5*  --   --  9.4 9.3  PHOS 6.0*  --   --   --   --  4.8* 4.1   < > = values in this interval not displayed.   Liver Function Tests: Recent Labs  Lab 06/08/2020 0700 06/05/2020 1347 05/20/20 0357 05/21/20 0347  AST 13* 21  --   --   ALT 10 11  --   --   ALKPHOS 94 103  --   --   BILITOT 0.7 0.9  --   --   PROT 6.5 6.5  --   --   ALBUMIN 3.3* 3.2* 2.7* 2.2*   Recent Labs  Lab 06/09/2020 1347  LIPASE 33  AMYLASE 32   Recent Labs  Lab 05/24/2020 1022  AMMONIA 16   CBC: Recent Labs  Lab 05/30/2020 0700 06/10/2020 0845 05/19/2020 1347 05/24/2020 1558 05/19/20 0437 05/19/20 0518 05/19/20 1534 05/19/20 2200 05/20/20 0357 05/21/20 0347  WBC 15.2*  --  24.2*  --  11.8*  --  9.5  --  8.4 8.1  NEUTROABS 13.4*  --   --   --   --   --   --   --   --   --   HGB 10.5*   < > 10.2*   < > 7.8*   < > 6.7* 7.6* 8.5* 7.0*  HCT 30.5*   < > 29.2*   < > 22.4*   < > 20.5* 22.9* 25.3* 21.0*  MCV 89.4  --  90.1  --  88.5  --  91.1  --  87.8 88.2  PLT 310  --  420*  --  227  --  190  --  228 281   < > = values in this interval not displayed.   Cardiac Enzymes: No results for input(s): CKTOTAL, CKMB, CKMBINDEX, TROPONINI in the last 168 hours. CBG: Recent Labs  Lab 05/19/20 0437 05/20/20 0357 05/21/20 0051 05/21/20 0414 05/21/20 0817  GLUCAP 102* 78 91 99 120*    Iron Studies: No results for input(s): IRON, TIBC, TRANSFERRIN, FERRITIN in the last 72 hours. Studies/Results: DG Chest Port 1 View  Result Date: 05/21/2020 CLINICAL DATA:  Intubation.  Respiratory failure. EXAM: PORTABLE CHEST 1 VIEW COMPARISON:  05/19/2020. FINDINGS: Endotracheal tube, feeding tube, right dual-lumen catheter, left central line in stable position. Heart size stable low lung volumes with persistent bibasilar and left upper mild  atelectasis/infiltrates. No pleural effusion or pneumothorax. Left upper extremity vascular stents again noted IMPRESSION: 1. Lines and tubes in stable position. 2. Low lung volumes with persistent bibasilar and left upper lobe atelectasis/infiltrates. Electronically Signed   By: Marcello Moores  Register   On: 05/21/2020 06:33   Medications: Infusions: . sodium chloride 10 mL/hr at 05/21/20 1300  . sodium chloride    . acyclovir Stopped (05/20/20 1636)  . clevidipine    . feeding supplement (VITAL 1.5 CAL) 45 mL/hr at 05/21/20 0159  . levETIRAcetam Stopped (05/21/20 0101)  . [START ON 05/22/2020] levETIRAcetam    . norepinephrine (LEVOPHED) Adult infusion Stopped (05/20/20 1035)    Scheduled Medications: . sodium chloride   Intravenous Once  . chlorhexidine gluconate (MEDLINE KIT)  15 mL Mouth Rinse BID  . Chlorhexidine Gluconate Cloth  6 each Topical Daily  . Chlorhexidine Gluconate Cloth  6 each Topical Q0600  . Darbepoetin Alfa      . darbepoetin (ARANESP) injection - DIALYSIS  150 mcg Intravenous Q Tue-HD  . docusate  100 mg Per Tube BID  . feeding supplement (PROSource TF)  45 mL Per Tube TID  . heparin sodium (porcine)      . mouth rinse  15 mL Mouth Rinse 10 times per day  . multivitamin  1 tablet Per Tube QHS  . pantoprazole sodium  40 mg Per Tube Q1200  . polyethylene glycol  17 g Per Tube Daily    have reviewed scheduled and prn medications.  Physical Exam: General: sedated on vent-  Continuous EEG Heart: RRR Lungs: CBS bilat Abdomen: obese, soft, non tender Extremities: pitting edema Dialysis Access: TDC     05/21/2020,1:31 PM  LOS: 3 days

## 2020-05-21 NOTE — Progress Notes (Addendum)
Neurology Progress Note with PMHx of  has a past medical history of Bilateral hydronephrosis (2006), Cataract, Cervical cancer ([place]) (2006), Chronic kidney disease, Depression, ESRD on hemodialysis ([place]), Hypothyroidism, Insomnia, Iron deficiency anemia, Peripheral vascular disease ([place]), Secondary hyperparathyroidism (of renal origin), Transfusion history, and Wears glasses.  Initially consulted for: New onset seizures in the setting of acute encephalopathy w/ hypertensive urgency/emergency, hyponatremia, missed dialysis with uremia  Major interval events/Subjective: - Propofol d'c 1317 on 3/7 - Fentanyl d'c 1036 on 3/7 - BP EMR records 165/130-170/70, witnessed 181/75 while in room, but < 160 overnight  Subjective: Mrs. Kuipers is a 65 yo w/ with ESRD on dialysis, NHR presented with altered mental status and hypertension, had a seizure in the ED requiring intubation for airway protectionPatient Hgb continues to drop 8.1 today. Patient remains obtunded despite discontinuation of Propofol. Patient also does have some R arm shaking when testing noxious stimuli around body, but this is not a consistent response.  Exam: Vitals:   05/21/20 0700 05/21/20 0818  BP: (!) 165/130 (!) 170/70  Pulse: (!) 110 (!) 120  Resp: (!) 24 (!) 28  Temp: 98.42 F (36.9 C)   SpO2: 100% 100%   Gen: In bed, NAD, mucous secretions coming out corner of mouth Resp: non-labored breathing, no acute distress Abd: soft, nt Skin: Warm and dry Extremities: Edematous throughout  Mental Status: Patient isobtunded, today eyes open to verbal stimulation but patient later in exam unable to open eyes to verbal stimulation , she does not fixate or track or follow commands. Patient eyes continue to have roving movments.  On attending examination later in the day appeared to favor looking towards the right Cranial Nerves: II:She did not blink to threat pupils are equal, round, and reactive to light, sluggish, 2.5 mm to 2  mm III,IV, RJ:JOAC are midline, and with roving eye movements V:VII:Corneals are intact to eyelash brush XI/XII: Intact cough and gag today Motor/sensory: Today she has no movement to noxious stimulation in all 4 extremities   3/5 She moved all extremities spontaneously andquasi-purposefully Reflexes: 3+ and symmetric in the brachioradialis, 2+ symmetric in the patellar's, negative jaw jerk Cerebellar: Unable to assess secondary to patient's mental status   Pertinent Labs: CBC Latest Ref Rng & Units 05/21/2020 05/20/2020 05/19/2020  WBC 4.0 - 10.5 K/uL 8.1 8.4 -  Hemoglobin 12.0 - 15.0 g/dL 7.0(L) 8.5(L) 7.6(L)  Hematocrit 36.0 - 46.0 % 21.0(L) 25.3(L) 22.9(L)  Platelets 150 - 400 K/uL 281 228 -   CMP Latest Ref Rng & Units 05/21/2020 05/20/2020 05/19/2020  Glucose 70 - 99 mg/dL 116(H) 80 -  BUN 8 - 23 mg/dL 29(H) 38(H) -  Creatinine 0.44 - 1.00 mg/dL 4.69(H) 6.55(H) -  Sodium 135 - 145 mmol/L 133(L) 130(L) 129(L)  Potassium 3.5 - 5.1 mmol/L 3.6 4.1 -  Chloride 98 - 111 mmol/L 97(L) 91(L) -  CO2 22 - 32 mmol/L 26 23 -  Calcium 8.9 - 10.3 mg/dL 9.3 9.4 -  Total Protein 6.5 - 8.1 g/dL - - -  Total Bilirubin 0.3 - 1.2 mg/dL - - -  Alkaline Phos 38 - 126 U/L - - -  AST 15 - 41 U/L - - -  ALT 0 - 44 U/L - - -    LP results: RBC 26, WBC 2, Protein 60, Glucose 95 (serum 234) Gram stain negative HSV pending  EEG 3/5: "ThisEEG recorded a partial seizure likely left frontal in origin as described above. The majority of the seizure was actually  subclinical in nature, followed by secondary generalization with convulsion." EEG 3/6: Generalized slowing, no seizures EEG 3/7: This study is suggestive of moderate diffuse encephalopathy, nonspecific etiology.Stimulation induced rhythmic periodic discharges were noted which is on the ictal-interictal continuum with low potential for seizures. No seizures ordefiniteepileptiform discharges were seen throughout the recording. EEG 3/8: This study  is suggestive of moderate diffuse encephalopathy, nonspecific etiology.Stimulation induced rhythmic periodic discharges were noted which is on the ictal-interictal continuum with low potential for seizures. No seizures ordefiniteepileptiform discharges were seen throughout the recording. Appears similar to 3/7 EEG.   MRI personally reviewed, bilateral occipital restricted diffusion w/ ADC correlate, R > L, agree with radiology, c/f PRES, less likely seizure related change, less likely infarct   Impression:  65 year old with ESRD on dialysis, NHR presented with altered mental status and hypertension, had a seizure in the ED requiring intubation for airway protection. Despite discontinuation of propofol patient continues to appear obtunded. Unsure if patient is slow to clear due to her ESRD and patient had iHD prior to the discontinuation of her Propofol, or if there is another process occurring causing continued encephalopathy. [name] BP also began to have upward trend and has been greater than sys 160 this AM, which is concerning in a patient with PRES. Due to patient's lack of improvement with the weaning of propofol will consider a repeat MRI to look for worsening of PRES. Additionally given her hyperreflexia in upper extremities with negative jaw jerk, will obtain MRI cervical spine to make sure we are not missing a compressive process.   Given she has completely been weaned off of sedation without any seizure activity for over 24 hours will discontinue long-term monitoring with EEG at this time  Recommendations: - Strict systolic BP <[id number], MAP 60-75; Cleviprex ordered - Repeat MRI Brain - MRI Cervical Spine - Continue to evaluate CSF HSV result - Discontinue EEG LTM - Neurology will continue to follow  Damita Dunnings, MD PGY-1  Attending Neurologist's note:  I personally saw this patient, gathering history, performing a full neurologic examination, reviewing relevant labs,  personally reviewing relevant imaging detailed above, and formulated the assessment and plan, adding the note above for completeness and clarity to accurately reflect my thoughts  CRITICAL CARE Performed by: Lorenza Chick   Total critical care time: 35 minutes  Critical care time was exclusive of separately billable procedures and treating other patients and exclusive of time spent teaching/supervising the resident physician.  Critical care was necessary to treat or prevent imminent or life-threatening deterioration.  Critical care was time spent personally by me on the following activities: development of treatment plan with patient and/or surrogate as well as nursing, discussions with consultants, evaluation of patient's response to treatment, examination of patient, obtaining history from patient or surrogate, ordering and performing treatments and interventions, ordering and review of laboratory studies, ordering and review of radiographic studies, pulse oximetry and re-evaluation of patient's condition.  Lesleigh Noe MD-PhD [facility] [phone] Available 7 AM to 7 PM, outside these hours please contact Neurologist on call listed on AMION

## 2020-05-21 NOTE — Progress Notes (Signed)
LTM EEG discontinued - no skin breakdown at unhook.   

## 2020-05-21 NOTE — Progress Notes (Signed)
Neurology Progress Note  Patient ID: Mary Wilcox is a 65 y.o. with PMHx of  has a past medical history of Bilateral hydronephrosis (2006), Cataract, Cervical cancer ([place]) (2006), Chronic kidney disease, Depression, ESRD on hemodialysis ([place]), Hypothyroidism, Insomnia, Iron deficiency anemia, Peripheral vascular disease ([place]), Secondary hyperparathyroidism (of renal origin), Transfusion history, and Wears glasses.  Initially consulted for: New onset seizures in the setting of acute encephalopathy w/ hypertensive urgency/emergency, hyponatremia, missed dialysis with uremia  Major interval events/Subjective: - Free T3 resulted low (1.4)  Exam: Vitals:   05/21/20 0323 05/21/20 0400  BP: (!) 136/59 (!) 151/64  Pulse: 94   Resp: 20 20  Temp:  (!) 97.52 F (36.4 C)  SpO2: 100%    Gen: In bed, comfortable  Resp: non-labored breathing, no grossly audible wheezing Cardiac: Perfusing extremities well  Abd: soft, nt Extremities: Edematous throughout   Mental Status: Patient is obtunded, today eyes open to noxious stimulation, she does not fixate or track or follow commands. Cranial Nerves: II: She did not blink to threat pupils are equal, round, and reactive to light, sluggish, 2.5 mm to 2 mm III,IV, VI: Eyes are midline, and with roving eye movements V: VII: Corneals are intact to saline XI/XII: Intact cough and gag Motor/sensory: Today she has no movement to noxious stimulation in all 4 extremities  3/5 She moved all extremities spontaneously and quasi-purposefully Cerebellar: Unable to assess secondary to patient's mental status   Pertinent Data:   Basic Metabolic Panel: Recent Labs  Lab 05/17/2020 0700 06/04/2020 0845 05/14/2020 1347 05/23/2020 1557 05/15/2020 1558 05/19/20 0437 05/19/20 0518 05/19/20 1534 05/20/20 0357 05/21/20 0347  NA 119*   < > 118*   < > 118* 129* 130* 129* 130* 133*  K 5.5*   < > 5.8*  --  5.8* 3.5 3.7  --  4.1 3.6  CL 83*  --  82*  --   --  91*  --    --  91* 97*  CO2 20*  --  19*  --   --  25  --   --  23 26  GLUCOSE 113*  --  234*  --   --  94  --   --  80 116*  BUN 85*  --  90*  --   --  27*  --   --  38* 29*  CREATININE 10.02*  --  10.53*  --   --  4.74*  --   --  6.55* 4.69*  CALCIUM 8.3*  --  8.2*  --   --  8.5*  --   --  9.4 9.3  MG  --   --  3.0*  --   --  2.2  --   --   --   --   PHOS  --   --  6.0*  --   --   --   --   --  4.8* 4.1   < > = values in this interval not displayed.    CBC: Recent Labs  Lab 06/07/2020 0700 06/03/2020 0845 06/09/2020 1347 05/29/2020 1558 05/19/20 0437 05/19/20 0518 05/19/20 1534 05/19/20 2200 05/20/20 0357 05/21/20 0347  WBC 15.2*  --  24.2*  --  11.8*  --  9.5  --  8.4 8.1  NEUTROABS 13.4*  --   --   --   --   --   --   --   --   --   HGB 10.5*   < >  10.2*   < > 7.8* 7.8* 6.7* 7.6* 8.5* 7.0*  HCT 30.5*   < > 29.2*   < > 22.4* 23.0* 20.5* 22.9* 25.3* 21.0*  MCV 89.4  --  90.1  --  88.5  --  91.1  --  87.8 88.2  PLT 310  --  420*  --  227  --  190  --  228 281   < > = values in this interval not displayed.    Coagulation Studies: Recent Labs    05/21/2020 1347  LABPROT 14.1  INR 1.1        LP results: RBC 26, WBC 2, Protein 60, Glucose 95 (serum 234) Gram stain negative HSV pending  EEG 3/5: "This EEG recorded a partial seizure likely left frontal in origin as described above.  The majority of the seizure was actually subclinical in nature, followed by secondary generalization with convulsion." EEG 3/6: Generalized slowing, no seizures EEG 3/7: This study is suggestive of moderate diffuse encephalopathy, nonspecific etiology. Stimulation induced rhythmic periodic discharges were noted which is on the ictal-interictal continuum with low potential for seizures. No seizures or definite epileptiform discharges were seen throughout the recording.  MRI personally reviewed, bilateral occipital restricted diffusion w/ ADC correlate, R > L, agree with radiology, c/f PRES, less likely seizure  related change, less likely infarct   Impression: 65 year old with new onset left frontal seizures with secondary generalization, with multiple seizure threshold lowering metabolic derangements.  Exam improving today with holding sedation. MRI most consistent with PRES likely secondary to hypertensive emergency in the setting of missed dialysis. LP results reassuring overall, elevated protein can be non-specific but may also be seen early in course of viral infection so will continue acyclovir for now.  Recommendations: Prelim pending review of LTM - continue LTM EEG while weaning sedation - continue Keppra 500 mg BID, adding 500 mg post-hemodialysis to better control myoclonus - continue acyclovir renally adjusted for meningitis coverage until PCR from CSF results negative for HSV  -- addedndum 7:40 PM, HSV resulted negative, discontinued  - Continue goal normotension - Appreciate excellent management of comorbidities per CCM - Neurology will continue to follow   [place] [phone]   CRITICAL CARE Performed by: Lorenza Chick  Total critical care time: 40 minutes  Critical care time was exclusive of separately billable procedures and treating other patients.  Critical care was necessary to treat or prevent imminent or life-threatening deterioration.  Critical care was time spent personally by me on the following activities: development of treatment plan with patient and/or surrogate as well as nursing, discussions with consultants, evaluation of patient's response to treatment, examination of patient, obtaining history from patient or surrogate, ordering and performing treatments and interventions, ordering and review of laboratory studies, ordering and review of radiographic studies, pulse oximetry and re-evaluation of patient's condition.

## 2020-05-22 ENCOUNTER — Inpatient Hospital Stay (HOSPITAL_COMMUNITY): Payer: Medicare Other

## 2020-05-22 DIAGNOSIS — I6783 Posterior reversible encephalopathy syndrome: Secondary | ICD-10-CM | POA: Diagnosis not present

## 2020-05-22 DIAGNOSIS — N186 End stage renal disease: Secondary | ICD-10-CM | POA: Diagnosis not present

## 2020-05-22 DIAGNOSIS — J9601 Acute respiratory failure with hypoxia: Secondary | ICD-10-CM | POA: Diagnosis not present

## 2020-05-22 DIAGNOSIS — Z992 Dependence on renal dialysis: Secondary | ICD-10-CM | POA: Diagnosis not present

## 2020-05-22 DIAGNOSIS — R569 Unspecified convulsions: Secondary | ICD-10-CM | POA: Diagnosis not present

## 2020-05-22 LAB — CBC
HCT: 26.5 % — ABNORMAL LOW (ref 36.0–46.0)
Hemoglobin: 8.7 g/dL — ABNORMAL LOW (ref 12.0–15.0)
MCH: 29.6 pg (ref 26.0–34.0)
MCHC: 32.8 g/dL (ref 30.0–36.0)
MCV: 90.1 fL (ref 80.0–100.0)
Platelets: 320 10*3/uL (ref 150–400)
RBC: 2.94 MIL/uL — ABNORMAL LOW (ref 3.87–5.11)
RDW: 16.8 % — ABNORMAL HIGH (ref 11.5–15.5)
WBC: 10.6 10*3/uL — ABNORMAL HIGH (ref 4.0–10.5)
nRBC: 0 % (ref 0.0–0.2)

## 2020-05-22 LAB — GLUCOSE, CAPILLARY
Glucose-Capillary: 145 mg/dL — ABNORMAL HIGH (ref 70–99)
Glucose-Capillary: 135 mg/dL — ABNORMAL HIGH (ref 70–99)
Glucose-Capillary: 150 mg/dL — ABNORMAL HIGH (ref 70–99)
Glucose-Capillary: 115 mg/dL — ABNORMAL HIGH (ref 70–99)
Glucose-Capillary: 132 mg/dL — ABNORMAL HIGH (ref 70–99)
Glucose-Capillary: 108 mg/dL — ABNORMAL HIGH (ref 70–99)

## 2020-05-22 LAB — TYPE AND SCREEN
ABO/RH(D): AB POS
Antibody Screen: NEGATIVE
Unit division: 0
Unit division: 0

## 2020-05-22 LAB — BPAM RBC
Blood Product Expiration Date: 202203202359
Blood Product Expiration Date: 202204112359
ISSUE DATE / TIME: 202203061803
ISSUE DATE / TIME: 202203081245
Unit Type and Rh: 8400
Unit Type and Rh: 8400

## 2020-05-22 LAB — RENAL FUNCTION PANEL
Albumin: 2.4 g/dL — ABNORMAL LOW (ref 3.5–5.0)
Albumin: 2.4 g/dL — ABNORMAL LOW (ref 3.5–5.0)
Anion gap: 11 (ref 5–15)
Anion gap: 10 (ref 5–15)
BUN: 26 mg/dL — ABNORMAL HIGH (ref 8–23)
BUN: 31 mg/dL — ABNORMAL HIGH (ref 8–23)
CO2: 25 mmol/L (ref 22–32)
CO2: 27 mmol/L (ref 22–32)
Calcium: 10.4 mg/dL — ABNORMAL HIGH (ref 8.9–10.3)
Calcium: 10.6 mg/dL — ABNORMAL HIGH (ref 8.9–10.3)
Chloride: 99 mmol/L (ref 98–111)
Chloride: 100 mmol/L (ref 98–111)
Creatinine, Ser: 3.37 mg/dL — ABNORMAL HIGH (ref 0.44–1.00)
Creatinine, Ser: 3.06 mg/dL — ABNORMAL HIGH (ref 0.44–1.00)
GFR, Estimated: 14 mL/min — ABNORMAL LOW (ref >60)
GFR, Estimated: 16 mL/min — ABNORMAL LOW (ref >60)
Glucose, Bld: 179 mg/dL — ABNORMAL HIGH (ref 70–99)
Glucose, Bld: 148 mg/dL — ABNORMAL HIGH (ref 70–99)
Phosphorus: 3.1 mg/dL (ref 2.5–4.6)
Phosphorus: 2.9 mg/dL (ref 2.5–4.6)
Potassium: 3.5 mmol/L (ref 3.5–5.1)
Potassium: 4.2 mmol/L (ref 3.5–5.1)
Sodium: 135 mmol/L (ref 135–145)
Sodium: 137 mmol/L (ref 135–145)

## 2020-05-22 LAB — TRIGLYCERIDES: Triglycerides: 143 mg/dL (ref <150)

## 2020-05-22 IMAGING — MR MR CERVICAL SPINE W/O CM
6 series · 32 of 48 positions shown · non-contrast
Comparison: None.

CLINICAL DATA: Seizure

EXAM:
MRI CERVICAL SPINE WITHOUT CONTRAST
TECHNIQUE: Multiplanar, multisequence MR imaging of the cervical spine was
performed. No intravenous contrast was administered.

[Series 25: T2 · sagittal · 3.0mm · 0.69mm/px · 4 of 15 slices shown (1 of 2)]
[im 1/15]
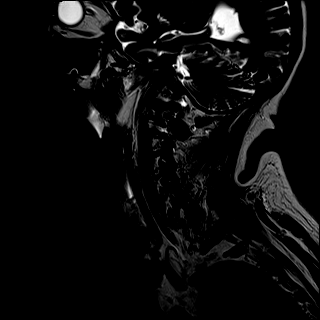
[im 5/15]
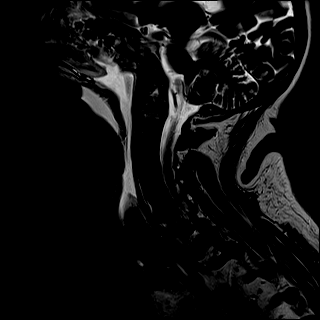
[im 10/15]
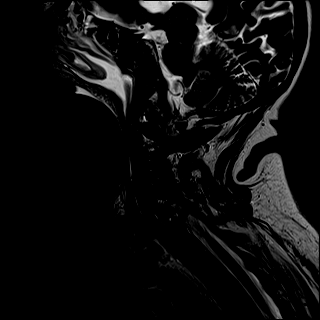
[im 15/15]
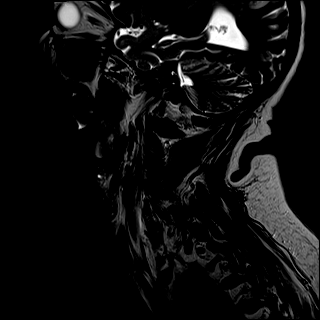

[Series 26: T1 · sagittal · 3.0mm · 0.69mm/px · 4 of 15 slices shown (1 of 2)]
[im 1/15]
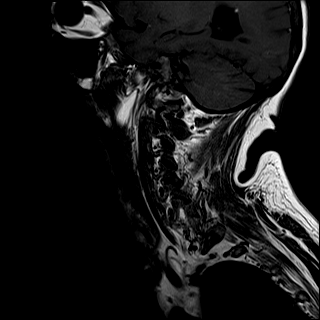
[im 5/15]
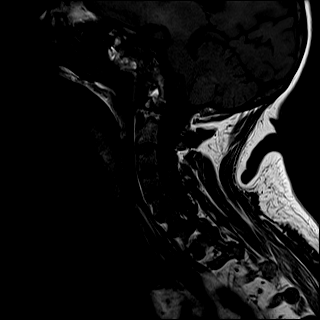
[im 10/15]
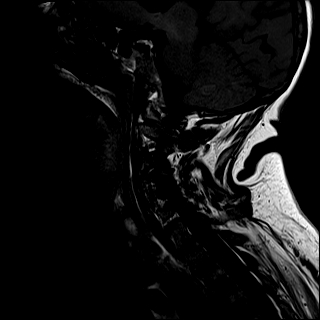
[im 15/15]
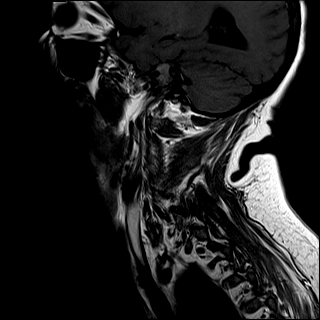

[Series 27: STIR · sagittal · 3.0mm · 0.86mm/px · 4 of 15 slices shown]
[im 1/15]
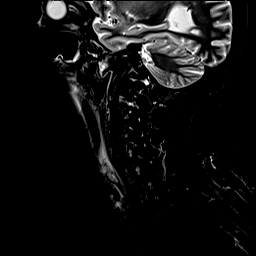
[im 5/15]
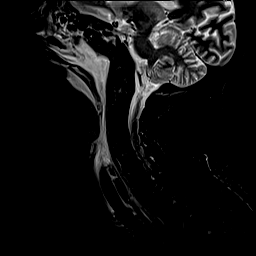
[im 10/15]
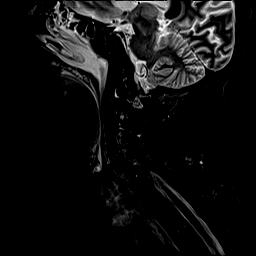
[im 15/15]
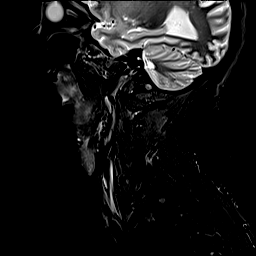

[Series 28: T2 · axial · 3.0mm · 0.66mm/px · z∈[-239,-117]mm · 9 of 40 slices shown (2 of 2)]
[im 1/40]
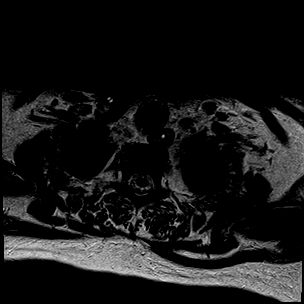
[im 8/40]
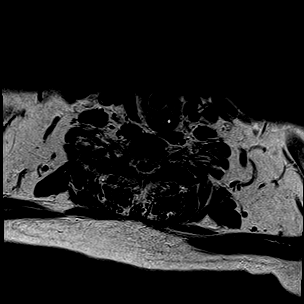
[im 11/40]
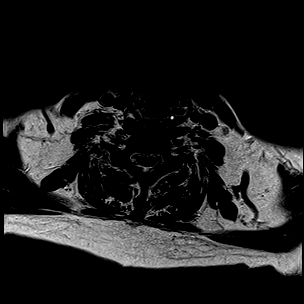
[im 18/40]
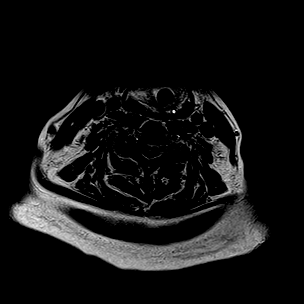
[im 22/40]
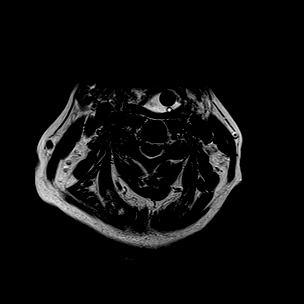
[im 29/40]
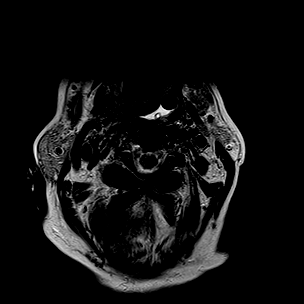
[im 32/40]
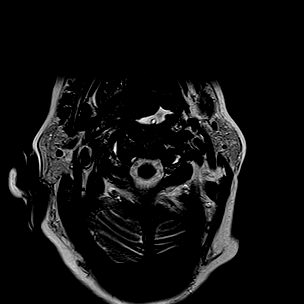
[im 36/40]
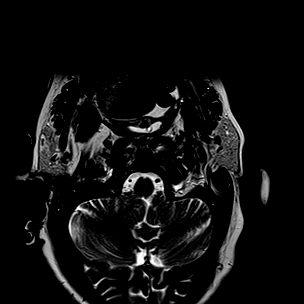
[im 40/40]
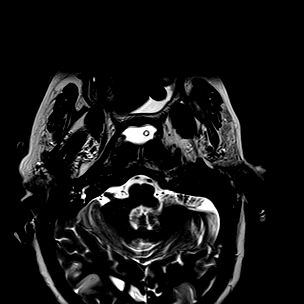

[Series 29: GRE · axial · 3.0mm · 0.39mm/px · z∈[-239,-217]mm · 2 of 40 slices shown]
[im 1/40]
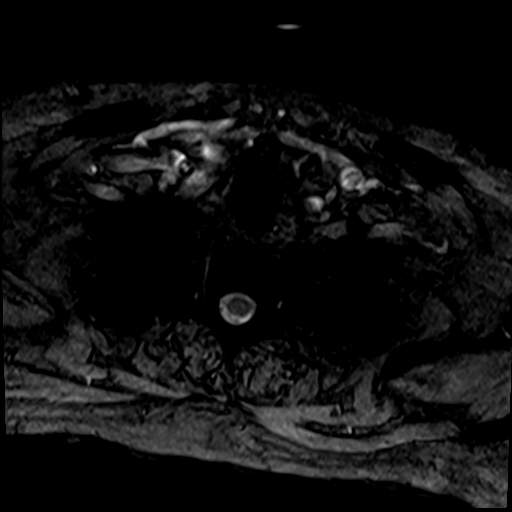
[im 8/40]
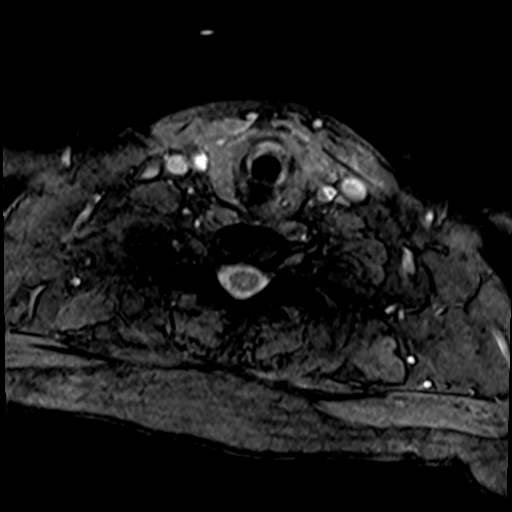

[Series 30: T1 · axial · 3.0mm · 0.39mm/px · z∈[-239,-117]mm · 9 of 40 slices shown (2 of 2)]
[im 1/40]
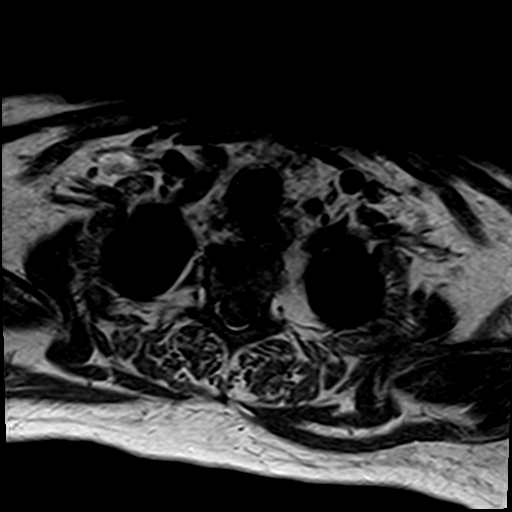
[im 8/40]
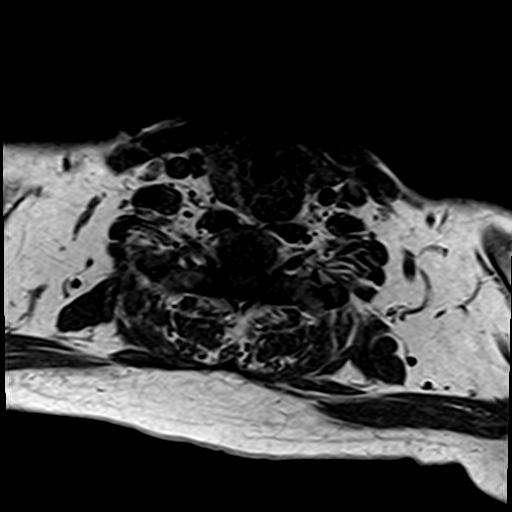
[im 11/40]
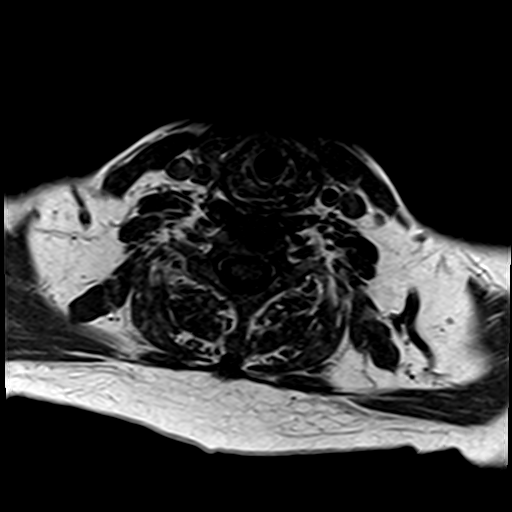
[im 18/40]
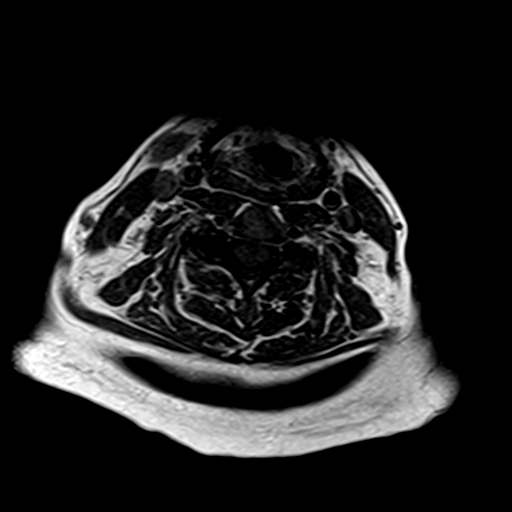
[im 22/40]
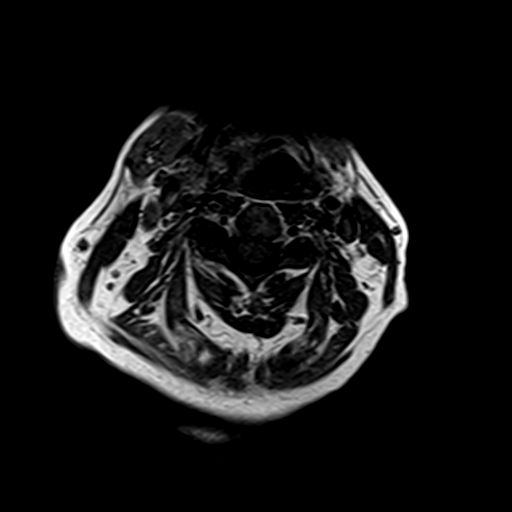
[im 29/40]
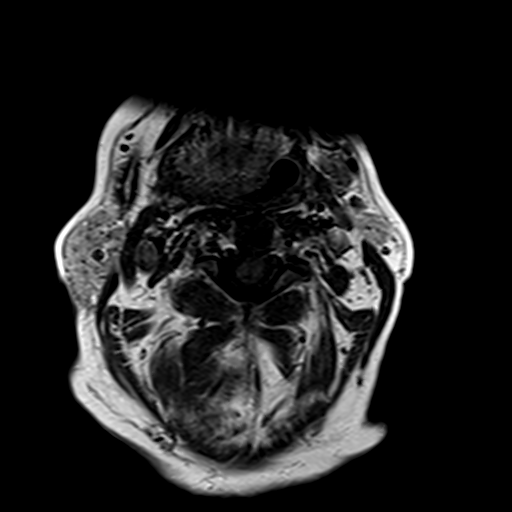
[im 32/40]
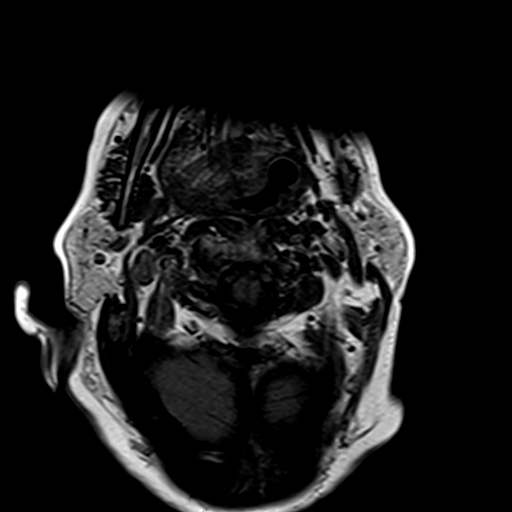
[im 36/40]
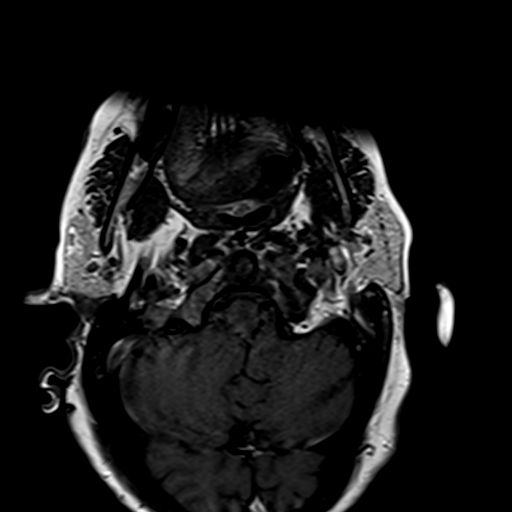
[im 40/40]
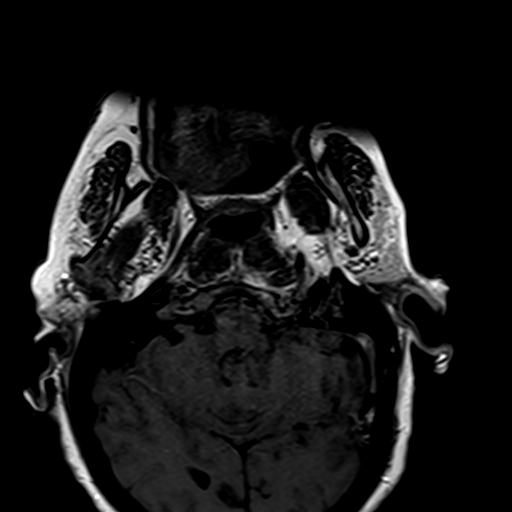

[32 of 48 positions shown; findings below may reference images not displayed]

FINDINGS: Alignment: Normal

Vertebrae: No fracture, evidence of discitis, or bone lesion.

Cord: Normal signal and morphology.

Posterior Fossa, vertebral arteries, paraspinal tissues:
Endotracheal and nasoesophageal intubation.

Disc levels:

C2-3: Unremarkable.

C3-4: Small central disc protrusion without stenosis.

C4-5: Unremarkable.

C5-6: Small central disc protrusion contacting the ventral spinal
cord. Mild spinal canal stenosis.

C6-7: Small central disc protrusion effacing the ventral thecal sac.
Mild spinal canal stenosis.

C7-T1: Unremarkable.
IMPRESSION: 1. No acute abnormality of the cervical spine.
2. Mild spinal canal stenosis at C5-6 and C6-7.

## 2020-05-22 IMAGING — DX DG CHEST 1V PORT
1 series · 1 of 1 positions shown · non-contrast
Comparison: Chest x-ray [DATE].

CLINICAL DATA: Intubation.

EXAM:
PORTABLE CHEST 1 VIEW

[chest ap]
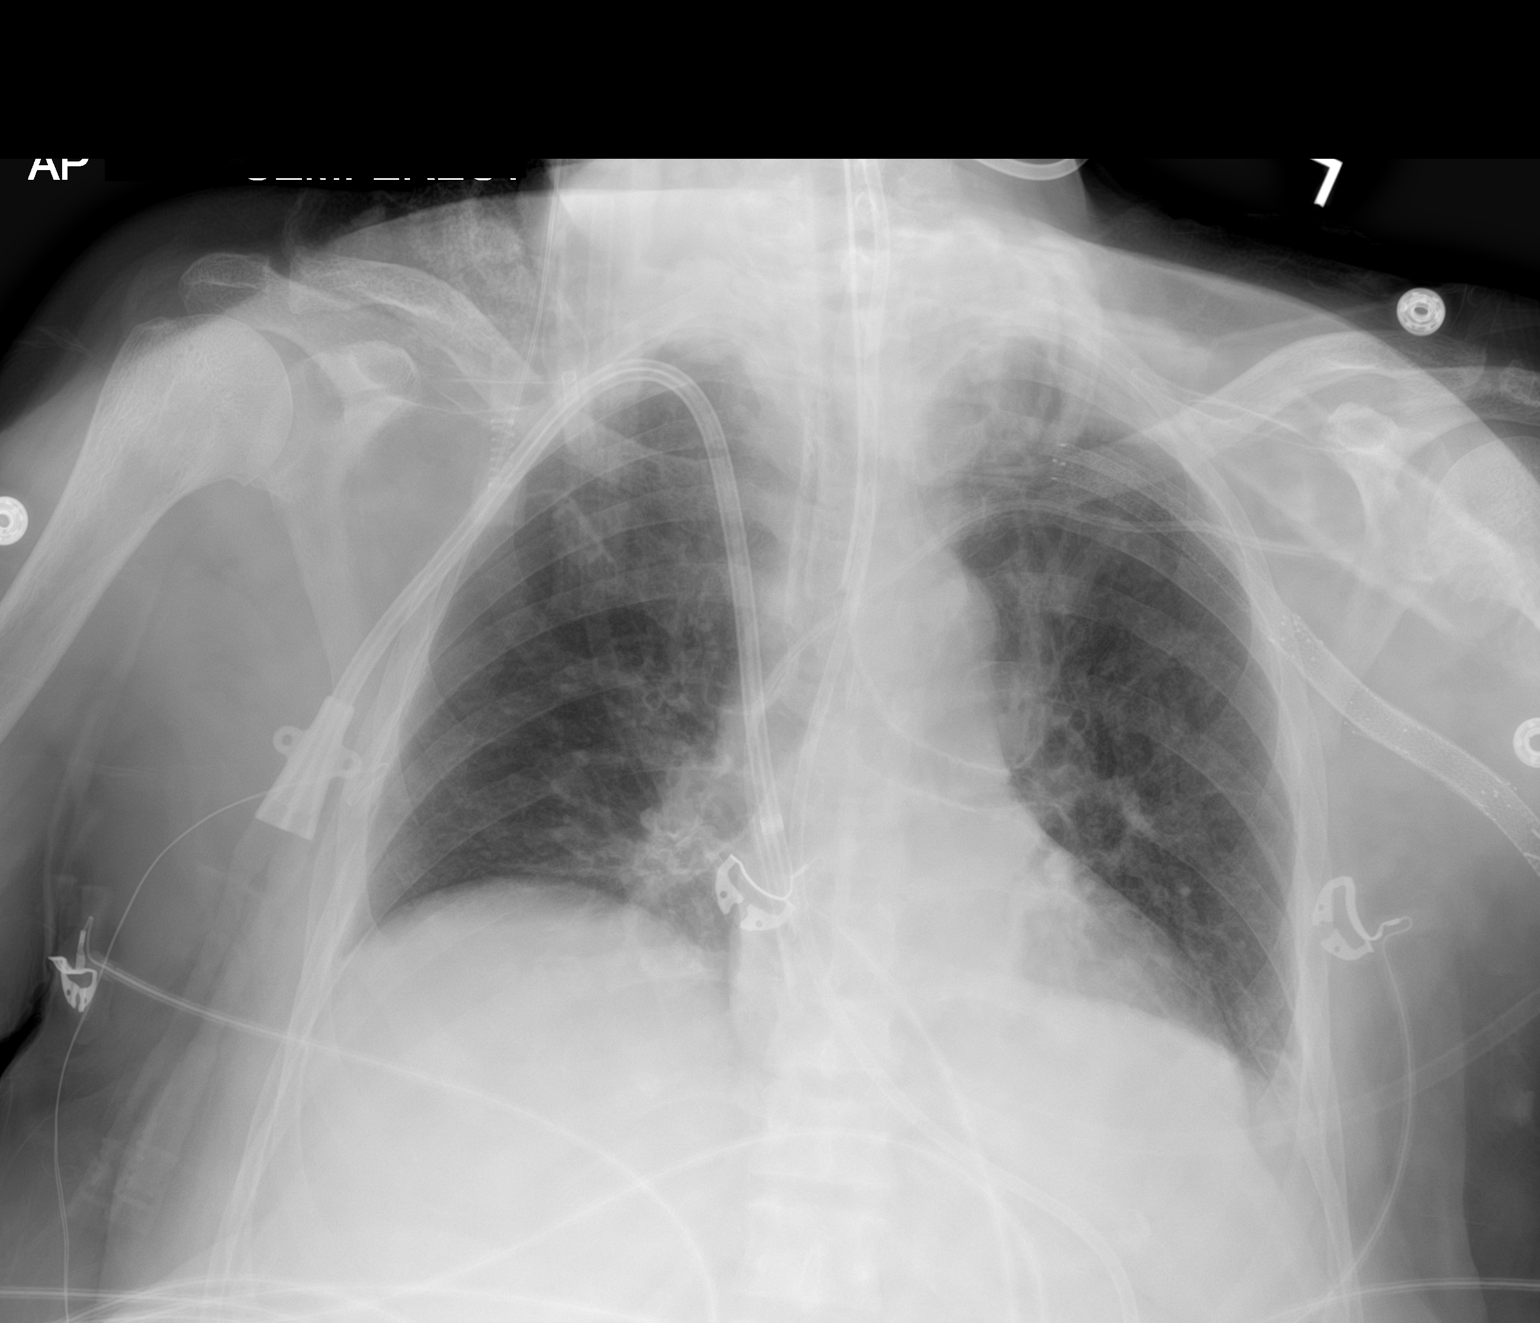

[1 of 1 positions shown; findings below may reference images not displayed]

FINDINGS: Endotracheal tube, feeding tube, right dual-lumen catheter, left
subclavian line in stable position. Heart size stable. Low lung
volumes with mild bibasilar atelectasis, improved aeration from
prior exam. No prominent pleural effusion. No pneumothorax. Left
subclavian vascular stents again noted.
IMPRESSION: 1. Lines and tubes in stable position.
2. Low lung volumes with mild bibasilar atelectasis, improved
aeration from prior exam.

## 2020-05-22 IMAGING — MR MR HEAD W/O CM
15 of 16 series · 41 of 48 positions shown · non-contrast
Comparison: Brain MRI [DATE]

CLINICAL DATA: Encephalopathy

EXAM:
MRI HEAD WITHOUT CONTRAST
TECHNIQUE: Multiplanar, multiecho pulse sequences of the brain and surrounding
structures were obtained without intravenous contrast.

[Series 5: DWI · axial · 3.0mm · 0.88mm/px · z∈[-65,+70]mm · 4 of 98 slices shown (1 of 4)]
[im 1/98]
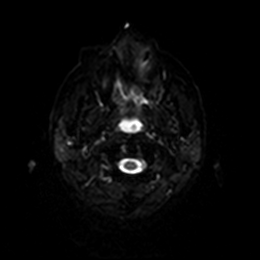
[im 33/98]
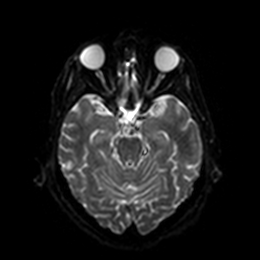
[im 65/98]
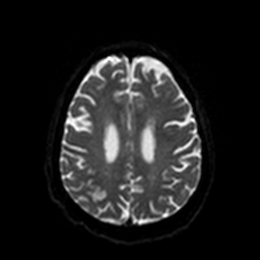
[im 98/98]
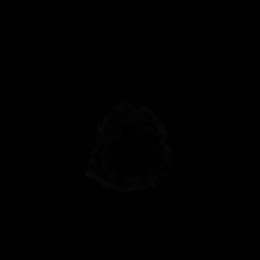

[Series 6: DWI · axial · 3.0mm · 0.88mm/px · z∈[-65,+70]mm · 2 of 49 slices shown (2 of 4)]
[im 1/49]
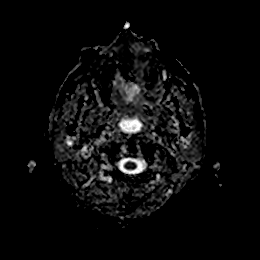
[im 49/49]
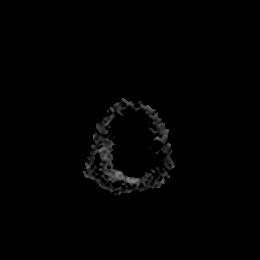

[Series 7: DWI · coronal · 4.0mm · 0.88mm/px · 3 of 68 slices shown (3 of 4)]
[im 1/68]
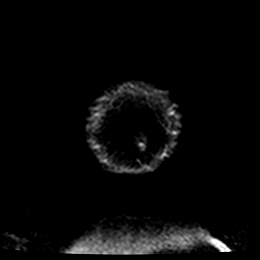
[im 34/68]
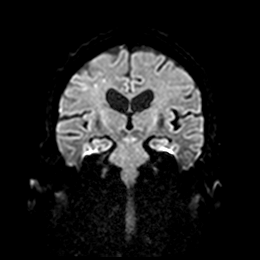
[im 68/68]
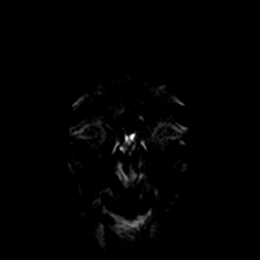

[Series 8: DWI · coronal · 4.0mm · 0.88mm/px · 2 of 34 slices shown (4 of 4)]
[im 1/34]
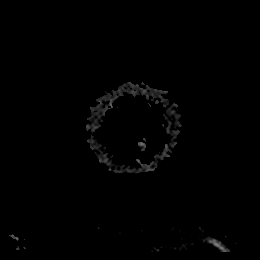
[im 34/34]
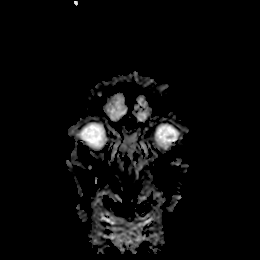

[Series 9: T1 · sagittal · 5.0mm · 0.75mm/px · 1 of 23 slices shown]
[im 1/23]
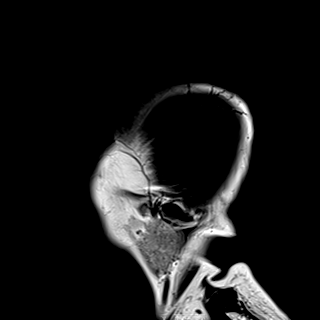

[Series 10: T2 · axial · 5.0mm · 0.72mm/px · 1 of 25 slices shown (1 of 3)]
[im 1/25]
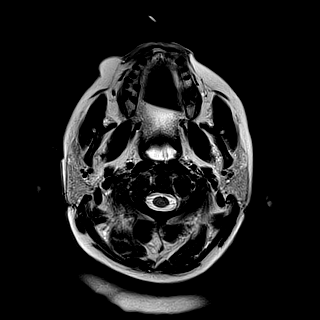

[Series 11: FLAIR · axial · 5.0mm · 0.45mm/px · 1 of 25 slices shown]
[im 1/25]
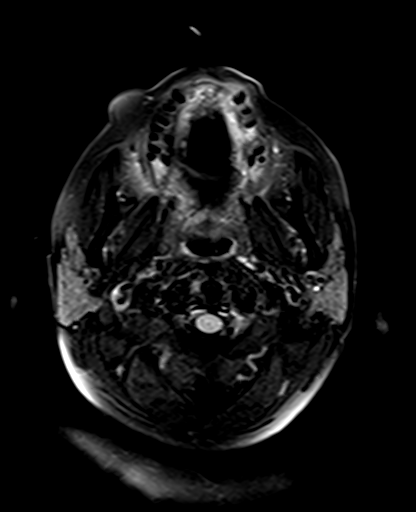

[Series 12: mag_images · axial · 3.0mm · 0.90mm/px · z∈[-68,+76]mm · 3 of 52 slices shown]
[im 1/52]
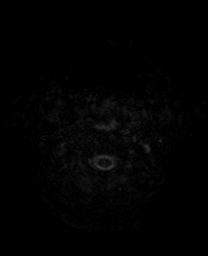
[im 26/52]
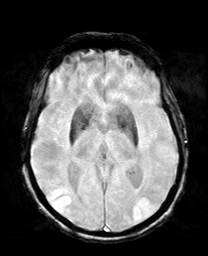
[im 52/52]
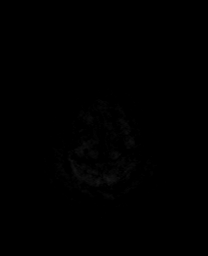

[Series 13: pha_images · axial · 3.0mm · 0.90mm/px · z∈[-68,+76]mm · 3 of 52 slices shown]
[im 1/52]
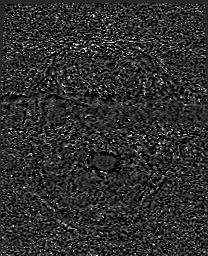
[im 26/52]
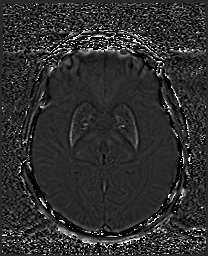
[im 52/52]
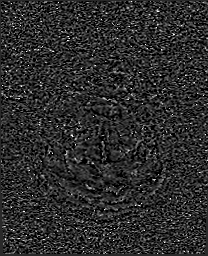

[Series 14: swi_images · axial · 3.0mm · 0.90mm/px · z∈[-68,+76]mm · 3 of 52 slices shown]
[im 1/52]
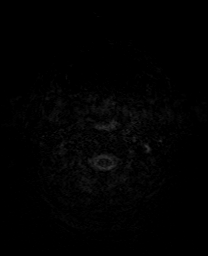
[im 26/52]
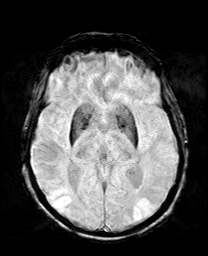
[im 52/52]
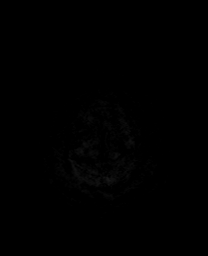

[Series 15: mip_images(sw) · axial · 24.0mm · 0.90mm/px · z∈[-58,+66]mm · 2 of 45 slices shown]
[im 1/45]
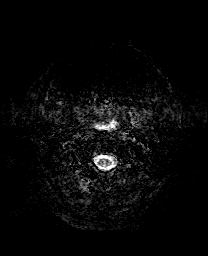
[im 45/45]
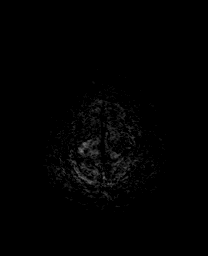

[Series 17: T2 · coronal · 5.0mm · 0.34mm/px · 1 of 29 slices shown (2 of 3)]
[im 1/29]
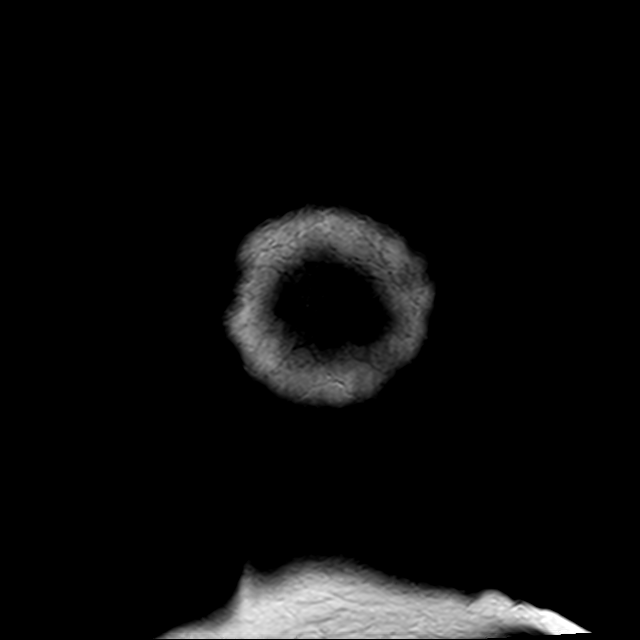

[Series 18: t1_mprage_tra_p2_iso · axial · 1.0mm · 0.98mm/px · z∈[-116,+58]mm · 8 of 176 slices shown]
[im 1/176]
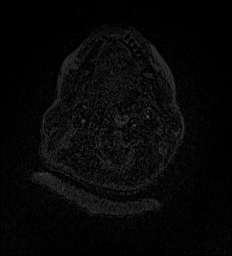
[im 22/176]
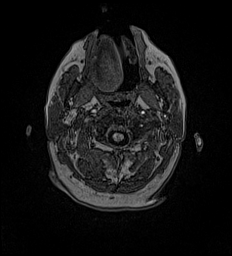
[im 44/176]
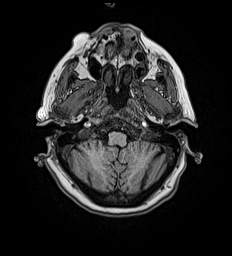
[im 66/176]
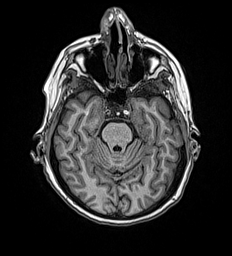
[im 110/176]
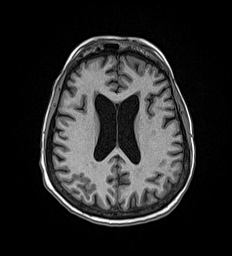
[im 132/176]
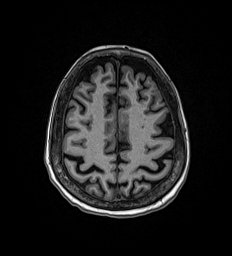
[im 154/176]
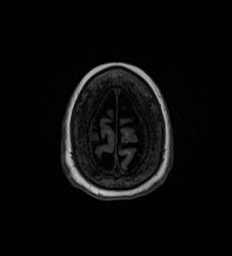
[im 176/176]
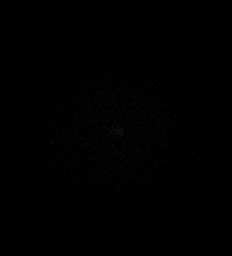

[Series 19: t1_mprage_tra_p2_iso_mpr_coronal · coronal · 1.0mm · 0.45mm/px · 5 of 164 slices shown]
[im 1/164]
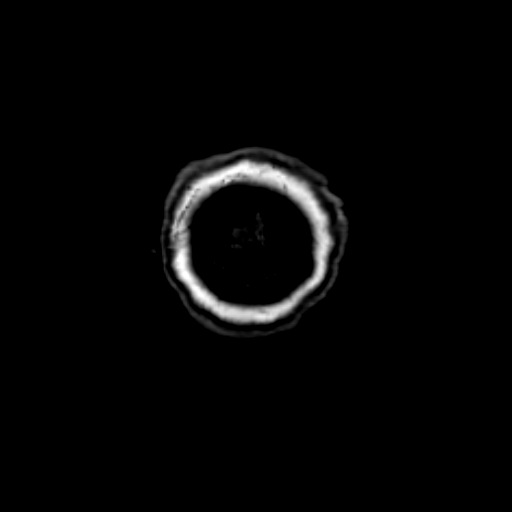
[im 24/164]
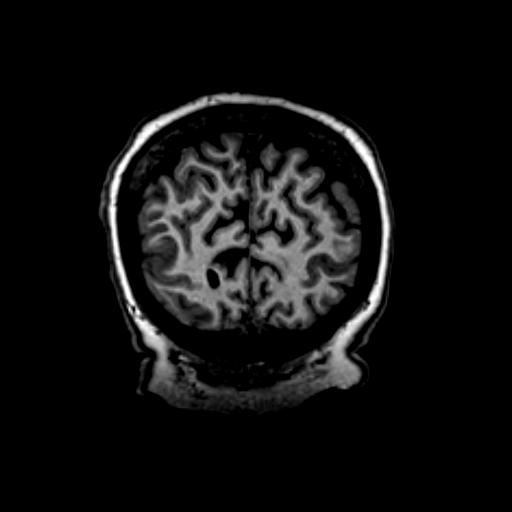
[im 47/164]
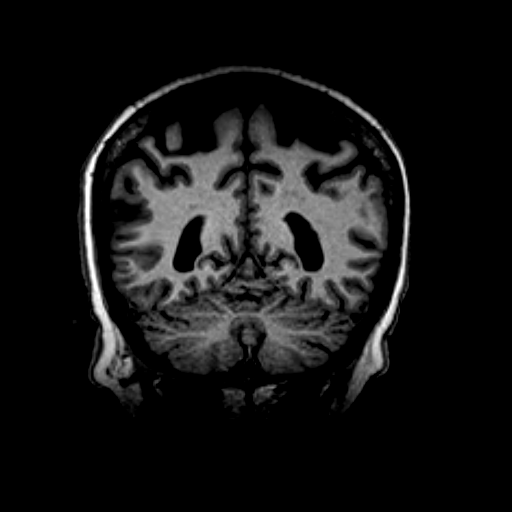
[im 70/164]
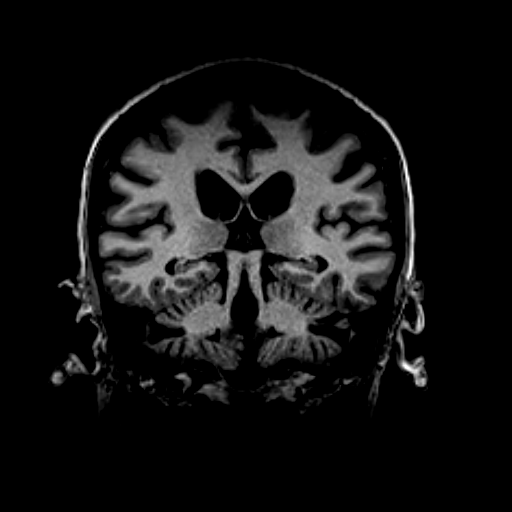
[im 94/164]
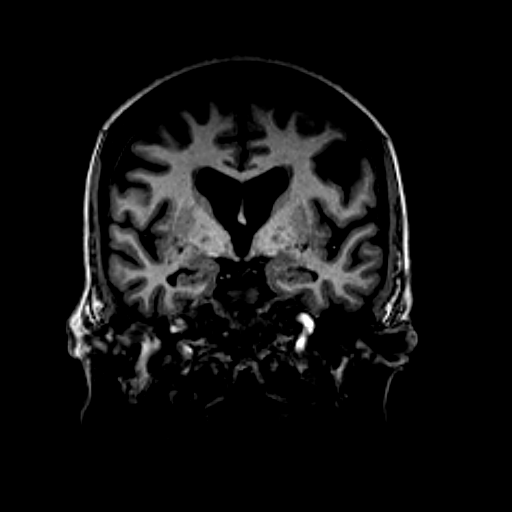

[Series 20: T2 · coronal · 3.0mm · 0.27mm/px · 2 of 32 slices shown (3 of 3)]
[im 1/32]
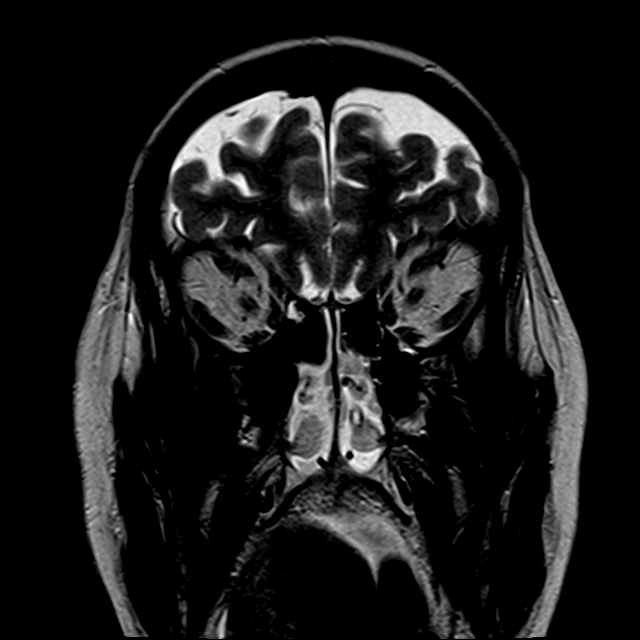
[im 32/32]
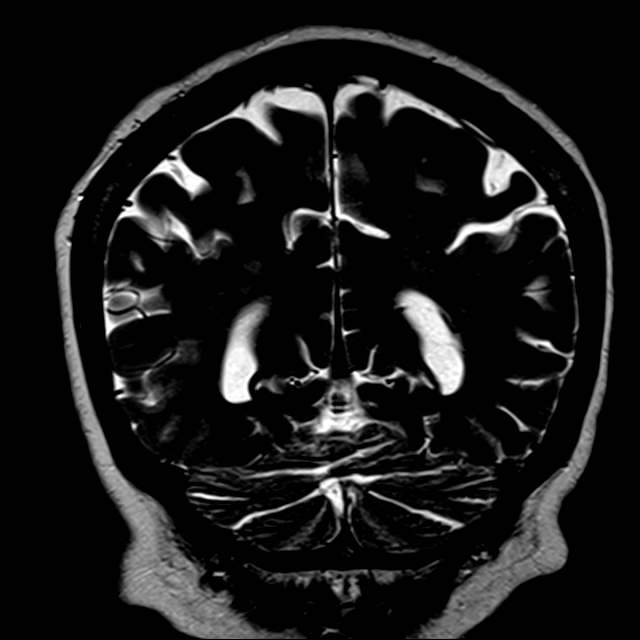

[41 of 48 positions shown; findings below may reference images not displayed]

FINDINGS: Brain: Worsening of abnormal diffusion restriction along the
cortices of posterior parietal lobes and the occipital lobes. Gyral
edema has worsened. There are punctate foci of abnormal diffusion
restriction within both frontal lobes. Normal white matter signal,
parenchymal volume and CSF spaces. The midline structures are
normal.

Vascular: Major flow voids are preserved.

Skull and upper cervical spine: Normal calvarium and skull base.
Visualized upper cervical spine and soft tissues are normal.

Sinuses/Orbits:No paranasal sinus fluid levels or advanced mucosal
thickening. No mastoid or middle ear effusion. Normal orbits.
IMPRESSION: 1. Worsening of abnormal diffusion restriction and cortical edema
predominantly involving the posterior parietal lobes and occipital
lobes, most consistent with PRES.

## 2020-05-22 MED ORDER — PRISMASOL BGK 4/2.5 32-4-2.5 MEQ/L REPLACEMENT SOLN: Status: DC

## 2020-05-22 MED ORDER — ALTEPLASE 2 MG IJ SOLR: 2.0000 mg | Freq: Once | INTRAMUSCULAR | Status: DC | PRN

## 2020-05-22 MED ORDER — HEPARIN SODIUM (PORCINE) 1000 UNIT/ML DIALYSIS
1000.0000 [IU] | INTRAMUSCULAR | Status: DC | PRN
  Administered 2020-05-24: 3200 [IU] via INTRAVENOUS_CENTRAL
  Filled 2020-05-22 (×3): qty 6

## 2020-05-22 MED ORDER — LABETALOL HCL 200 MG PO TABS
200.0000 mg | ORAL_TABLET | Freq: Three times a day (TID) | ORAL | Status: DC
  Administered 2020-05-22 – 2020-05-24 (×7): 200 mg
  Filled 2020-05-22 (×6): qty 1

## 2020-05-22 MED ORDER — PRISMASOL BGK 4/2.5 32-4-2.5 MEQ/L EC SOLN: Status: DC

## 2020-05-22 MED ORDER — PROPOFOL 1000 MG/100ML IV EMUL
INTRAVENOUS | Status: AC
  Administered 2020-05-22: 5 ug/kg/min via INTRAVENOUS
  Filled 2020-05-22: qty 100

## 2020-05-22 MED ORDER — PROPOFOL 1000 MG/100ML IV EMUL
5.0000 ug/kg/min | INTRAVENOUS | Status: DC
  Administered 2020-05-23: 15 ug/kg/min via INTRAVENOUS
  Administered 2020-05-23 – 2020-05-24 (×2): 10 ug/kg/min via INTRAVENOUS
  Filled 2020-05-22 (×5): qty 100

## 2020-05-22 MED ORDER — SODIUM CHLORIDE 0.9 % FOR CRRT: INTRAVENOUS_CENTRAL | Status: DC | PRN

## 2020-05-22 MED ORDER — NYSTATIN 100000 UNIT/ML MT SUSP
5.0000 mL | Freq: Four times a day (QID) | OROMUCOSAL | Status: DC
  Administered 2020-05-22 – 2020-05-31 (×35): 500000 [IU] via OROMUCOSAL
  Filled 2020-05-22 (×34): qty 5

## 2020-05-22 MED ORDER — LABETALOL HCL 200 MG PO TABS
200.0000 mg | ORAL_TABLET | Freq: Three times a day (TID) | ORAL | Status: DC
  Administered 2020-05-22 (×2): 200 mg via ORAL
  Filled 2020-05-22 (×3): qty 1

## 2020-05-22 NOTE — Procedures (Signed)
Patient Name:Mary Wilcox QGB:[id number] Epilepsy Attending:Lashunda Greis Barbra Sarks Referring Physician/Provider:Dr Roland Rack Duration:05/21/2020 1156 to 05/21/2020 1452  Patient history:65 year old female being evaluated for altered mental status. EEG to evaluate for seizure  Level of alertness:Awake/lethargic  AEDs during EEG study:LEV  Technical aspects: This EEG study was done with scalp electrodes positioned according to the 10-20 International system of electrode placement. Electrical activity was acquired at a sampling rate of 500Hz  and reviewed with a high frequency filter of 70Hz  and a low frequency filter of 1Hz . EEG data were recorded continuously and digitally stored.   Description:EEG showed continuous generalized 3 to 6 Hz theta-delta slowing admixed with 15-18hz  frontocentral beta activity.Periodic discharges with triphasic morphology at 0.5 to 1 Hz were noted predominantly when patient was stimulated.  ABNORMALITY -Stimulation induced rhythmic periodic discharges, generalized -Continuousslow, generalized  IMPRESSION: This study is suggestive of moderate diffuse encephalopathy, nonspecific etiology.Stimulation induced rhythmic periodic discharges were noted which is on the ictal-interictal continuum with low potential for seizures. No seizures ordefiniteepileptiform discharges were seen throughout the recording.  Ryelan Kazee Barbra Sarks

## 2020-05-22 NOTE — Progress Notes (Signed)
[facility] Progress Note Patient Name: LAQUANTA HUMMEL DOB: 05-18-1955 MRN: [id number]   Date of Service  05/22/2020  HPI/Events of Note  Agitation - Associated with increased BP and posturing.   eICU Interventions  Plan: 1. Restart Propofol IV infusion. Titrate to RASS = 0 to -1.      Intervention Category Major Interventions: Delirium, psychosis, severe agitation - evaluation and management  Bettyjo Lundblad Eugene 05/22/2020, 7:21 PM

## 2020-05-22 NOTE — Plan of Care (Signed)

## 2020-05-22 NOTE — Progress Notes (Signed)
[facility] Progress Note Patient Name: Mary Wilcox DOB: [birth year] MRN: [id number]   Date of Service  05/22/2020  HPI/Events of Note  Nursing reports oral thrush.   eICU Interventions  Nystatin Suspension 5 mL to mouth/throat now and QID.     Intervention Category Major Interventions: Other:  Lysle Dingwall 05/22/2020, 9:41 PM

## 2020-05-22 NOTE — Progress Notes (Addendum)
NAME:  Mary Wilcox, MRN:  [id number], DOB:  January 13, 1956, LOS: 4 ADMISSION DATE:  06/01/2020, CONSULTATION DATE:  05/22/2020  REFERRING MD:  Shelly Coss, ED PA  CHIEF COMPLAINT: Altered mental status, seizure  Brief History:  65 year old with ESRD on dialysis, [facility] presented with altered mental status and hypertension, had a seizure in the ED requiring intubation for airway protection  Past Medical History:  ESRD on HD T/TH/S PAD Hypothyroidism Cervical cancer 2006 status post radiation  Significant Hospital Events:  3/5 5 admitted with mental status change.  Had witnessed seizure while getting EEG requiring intubation during propofol load and Keppra load 3/6:MRI, pleated showing bilateral posterior cerebral cortical swelling and restricted diffusion fever and complicated posterior reversible encephalopathy syndrome or seizure phenomenon.  Got hypotensive during dialysis required left subclavian triple-lumen catheter placement.  Started on norepinephrine.  Got 1 unit blood. Stopped [place] heparin 3/7: EEG negative for seizure. GCS 3.  Weaning sedating medications.  Getting dialysis.  Weaning norepinephrine still hypotensive. core track for nutrition. got extra treatment of dialysis 3/8: EEG still negative for seizure but showing diffuse slowing.  Grimaces and opens eyes to noxious stimulus but still not following commands.  Failed pressure support attempts.  Sputum sent. Had to be started on Cleviprex as SBP > 160. Stopped acyclovir as HSV was neg. hgb drifted down gm no source of bleeding. Got another unit blood.  3/9 repeat MRI showing worsening PRES. BP better controlled on Cleviprex. Started labetolol VT in effort to wean celeviprex. Neuro marginally better. Actually opened eyes to voice.   Consults:  Renal Neurology  Procedures:  ETT 3/5 >> Lt [place] CVL 3/6 >>  Significant Diagnostic Tests:  CT angio head/ neck >> stented left subclavian and axillary veins with stent occlusion MRI brain 3/5  >>Bilateral posterior cerebral cortical swelling and restricted diffusion, favor complicated PRES vs seizure LTM EEG 3/6 >> moderate diffuse encephalopathy CSF 3/5 >> normal   Micro Data:  CSF 3/5 >> ng  Antimicrobials:   acyclovir (stopped 3/8)  Interim History / Subjective:  Perhaps a little more awake   Objective   Blood pressure (Abnormal) 157/61, pulse 79, temperature 98.96 F (37.2 C), resp. rate 20, height 4\' 11"  (1.499 m), weight 73.6 kg, SpO2 99 %. CVP:  [2 mmHg] 2 mmHg  Vent Mode: PRVC FiO2 (%):  [30 %] 30 % Set Rate:  [20 bmp] 20 bmp Vt Set:  [350 mL] 350 mL PEEP:  [5 cmH20] 5 cmH20 Plateau Pressure:  [15 cmH20-19 cmH20] 15 cmH20   Intake/Output Summary (Last 24 hours) at 05/22/2020 0922 Last data filed at 05/22/2020 0700 Gross per 24 hour  Intake 2007.81 ml  Output 4000 ml  Net -1992.19 ml   Filed Weights   05/20/20 0500 05/20/20 0700 05/22/20 0413  Weight: 82 kg 82 kg 73.6 kg    Examination: General 65 year old female remains on full vent support HENT NCAT no JVD  pulm dc bases. No accessory use  Card RRR abd dependent edema pulses are strong gu no UOP Neuro opened eyes to voice but no movement to noxious/painful stim    Resolved Hospital Problem list   Hyperkalemia, resolved with urgent dialysis on 3/5 Hypotensive  Assessment & Plan:    Acute metabolic encephalopathy with seizure in the setting of PRES  -Seen by neurology, we appreciate their assistance. Repeat MRI showed findings c/w worsening PRES Plan Cont keppra Stopped acyclovir  Strict BP goals <140 cont cleviprex and added Labetolol  Starting CRRT  as well Cont serial neuro checks Avoid fevers Minimize sedation (off gtts since 3/7)   Acute respiratory failure in the setting of ineffective airway protection with seizure pcxr w/ improved aeration  Mental status is her major barrier to extubation at this point  Plan Cont full vent support w/ daily assessment for weaning VAP  bundle Volume removal w/ iHD  Hypertensive crisis on admission -did have some hypotension (may have been sedation related) Plan Cont cleviprex and labetolol as above BP goal <140  ESRD on HD, missed HD on 3/3 Plan CRRT per nephro  Anemia.  Etiology not clear no obvious source of blood loss.  Received 1 unit PRBCs on 3/6.  And again on 3/8 for hgb of 7. hgb is 8.7 this am  Plan Trend cbc Holding ac   Hyperglycemia Plan ssi   Best practice (evaluated daily)  Diet: npo, ordering core track 3/7 and tubefeeds Pain/Anxiety/Delirium protocol (if indicated): Propofol /Fent , goal RASS-1,Changing RASS goal to 0 VAP protocol (if indicated): Y DVT prophylaxis: SQ heparin-->hold scds GI prophylaxis: protonix Glucose control: SSI Mobility: Bed rest Disposition:ICU  Goals of Care:  Last date of multidisciplinary goals of care discussion: Still pending-->attempted to call daughter no answer 3/9 Code Status: Full  My critical care time is 60min  Erick Colace ACNP-BC [place] Pager # [phone] OR # [phone] if no answer

## 2020-05-22 NOTE — Progress Notes (Signed)
Subjective: seen in ICU, not responsive  Objective Vital signs in last 24 hours: Vitals:   05/22/20 0800 05/22/20 0900 05/22/20 1000 05/22/20 1100  BP: (!) 144/51 (!) 159/59 (!) 132/48 (!) 137/57  Pulse: 83 87 79 79  Resp: 20 (!) _0 Temp: 98.6 F (37 C) 98.78 F (37.1 C) 98.6 F (37 C) 99.32 F (37.4 C)  TempSrc: Esophageal     SpO2: 100% 100% 98% 99%  Weight:      Height:       Weight change:   Intake/Output Summary (Last 24 hours) at 05/22/2020 1136 Last data filed at 05/22/2020 0900 Gross per 24 hour  Intake 1751.81 ml  Output 4000 ml  Net -2248.19 ml    OP HD: TTS AF  3h 78mn  75.5kg  2/2 bath Hep 7400  RIJ TDC Mircera 60 MCG q 2wks last given 05/07/20  Venofer 50 weekly  Assessment/Plan 1. HTN crisis/ PRES by MRI/ seizures: due to missing HD and chronic vol overload d/t poor compliance. Repeat MRI showed worsening PRES. 4 L off yest w/ HD, under dry wt now, still some edema on exam. Plan transition to CRRT today to avoid large volume shifts as w/ regular HD. Per neuro goal is SBP< 140.  2. Vol overload: w/ pulm edema initial CXR w/ acute resp failure. CXR clear now, sp HD x 3 w/ 10 L off in total.  2. ESRD - due to cervical Ca w/ obstruction, started HD in 2012. Getting HD TTS.  Will do CRRT starting today.  3. Anemia of ESRD - Hgb 8's today.  SP darbe 150ug on 3/08 here > will hold further esa until BP's are under control (+PRES).  4. Metabolic bone disease - calcium 8.3 no vitamin D at outpatient unit, continue calcium acetate and Sensipar when taking p.o.'s 5. Nutrition -ALB 3.3, currently n.p.o. 6. FTT - [facility]     RKelly Splinter MD 05/22/2020, 11:36 AM       Labs: Basic Metabolic Panel: Recent Labs  Lab 05/20/20 0357 05/21/20 0347 05/22/20 0406  NA 130* 133* 135  K 4.1 3.6 3.5  CL 91* 97* 99  CO2 _1 GLUCOSE 80 116* 179*  BUN 38* 29* 26*  CREATININE 6.55* 4.69* 3.37*  CALCIUM 9.4 9.3 10.4*  PHOS 4.8*  4.1 3.1   Liver Function Tests: Recent Labs  Lab 05/17/2020 0700 05/21/2020 1347 05/20/20 0357 05/21/20 0347 05/22/20 0406  AST 13* 21  --   --   --   ALT 10 11  --   --   --   ALKPHOS 94 103  --   --   --   BILITOT 0.7 0.9  --   --   --   PROT 6.5 6.5  --   --   --   ALBUMIN 3.3* 3.2* 2.7* 2.2* 2.4*   Recent Labs  Lab 06/13/2020 1347  LIPASE 33  AMYLASE 32   Recent Labs  Lab 05/16/2020 1022  AMMONIA 16   CBC: Recent Labs  Lab 06/05/2020 0700 05/15/2020 0845 05/19/20 0437 05/19/20 0518 05/19/20 1534 05/19/20 2200 05/20/20 0357 05/21/20 0347 05/22/20 0406  WBC 15.2*   < > 11.8*  --  9.5  --  8.4 8.1 10.6*  NEUTROABS 13.4*  --   --   --   --   --   --   --   --   HGB 10.5*   < >  7.8*   < > 6.7*   < > 8.5* 7.0* 8.7*  HCT 30.5*   < > 22.4*   < > 20.5*   < > 25.3* 21.0* 26.5*  MCV 89.4   < > 88.5  --  91.1  --  87.8 88.2 90.1  PLT 310   < > 227  --  190  --  228 281 320   < > = values in this interval not displayed.   Cardiac Enzymes: No results for input(s): CKTOTAL, CKMB, CKMBINDEX, TROPONINI in the last 168 hours. CBG: Recent Labs  Lab 05/21/20 1554 05/21/20 1932 05/21/20 2320 05/22/20 0349 05/22/20 0751  GLUCAP 138* 163* 151* 145* 135*    Iron Studies: No results for input(s): IRON, TIBC, TRANSFERRIN, FERRITIN in the last 72 hours. Studies/Results: MR BRAIN WO CONTRAST  Result Date: 05/22/2020 CLINICAL DATA:  Encephalopathy EXAM: MRI HEAD WITHOUT CONTRAST TECHNIQUE: Multiplanar, multiecho pulse sequences of the brain and surrounding structures were obtained without intravenous contrast. COMPARISON:  Brain MRI 05/19/2020 FINDINGS: Brain: Worsening of abnormal diffusion restriction along the cortices of posterior parietal lobes and the occipital lobes. Gyral edema has worsened. There are punctate foci of abnormal diffusion restriction within both frontal lobes. Normal white matter signal, parenchymal volume and CSF spaces. The midline structures are normal. Vascular:  Major flow voids are preserved. Skull and upper cervical spine: Normal calvarium and skull base. Visualized upper cervical spine and soft tissues are normal. Sinuses/Orbits:No paranasal sinus fluid levels or advanced mucosal thickening. No mastoid or middle ear effusion. Normal orbits. IMPRESSION: 1. Worsening of abnormal diffusion restriction and cortical edema predominantly involving the posterior parietal lobes and occipital lobes, most consistent with PRES. Electronically Signed   By: Ulyses Jarred M.D.   On: 05/22/2020 01:42   MR CERVICAL SPINE WO CONTRAST  Result Date: 05/22/2020 CLINICAL DATA:  Seizure EXAM: MRI CERVICAL SPINE WITHOUT CONTRAST TECHNIQUE: Multiplanar, multisequence MR imaging of the cervical spine was performed. No intravenous contrast was administered. COMPARISON:  None. FINDINGS: Alignment: Normal Vertebrae: No fracture, evidence of discitis, or bone lesion. Cord: Normal signal and morphology. Posterior Fossa, vertebral arteries, paraspinal tissues: Endotracheal and nasoesophageal intubation. Disc levels: C2-3: Unremarkable. C3-4: Small central disc protrusion without stenosis. C4-5: Unremarkable. C5-6: Small central disc protrusion contacting the ventral spinal cord. Mild spinal canal stenosis. C6-7: Small central disc protrusion effacing the ventral thecal sac. Mild spinal canal stenosis. C7-T1: Unremarkable. IMPRESSION: 1. No acute abnormality of the cervical spine. 2. Mild spinal canal stenosis at C5-6 and C6-7. Electronically Signed   By: Ulyses Jarred M.D.   On: 05/22/2020 01:45   DG Chest Port 1 View  Result Date: 05/22/2020 CLINICAL DATA:  Intubation. EXAM: PORTABLE CHEST 1 VIEW COMPARISON:  Chest x-ray 05/19/2020. FINDINGS: Endotracheal tube, feeding tube, right dual-lumen catheter, left subclavian line in stable position. Heart size stable. Low lung volumes with mild bibasilar atelectasis, improved aeration from prior exam. No prominent pleural effusion. No pneumothorax. Left  subclavian vascular stents again noted. IMPRESSION: 1. Lines and tubes in stable position. 2. Low lung volumes with mild bibasilar atelectasis, improved aeration from prior exam. Electronically Signed   By: Marcello Moores  Register   On: 05/22/2020 06:26   DG Chest Port 1 View  Result Date: 05/21/2020 CLINICAL DATA:  Intubation.  Respiratory failure. EXAM: PORTABLE CHEST 1 VIEW COMPARISON:  05/19/2020. FINDINGS: Endotracheal tube, feeding tube, right dual-lumen catheter, left central line in stable position. Heart size stable low lung volumes with persistent bibasilar and left upper mild  atelectasis/infiltrates. No pleural effusion or pneumothorax. Left upper extremity vascular stents again noted IMPRESSION: 1. Lines and tubes in stable position. 2. Low lung volumes with persistent bibasilar and left upper lobe atelectasis/infiltrates. Electronically Signed   By: Marcello Moores  Register   On: 05/21/2020 06:33   Medications: Infusions: . sodium chloride 10 mL/hr at 05/22/20 0900  . sodium chloride    . clevidipine 1 mg/hr (05/22/20 0900)  . feeding supplement (VITAL 1.5 CAL) 1,000 mL (05/21/20 2015)  . levETIRAcetam Stopped (05/22/20 0229)  . levETIRAcetam      Scheduled Medications: . chlorhexidine gluconate (MEDLINE KIT)  15 mL Mouth Rinse BID  . Chlorhexidine Gluconate Cloth  6 each Topical Daily  . Chlorhexidine Gluconate Cloth  6 each Topical Q0600  . darbepoetin (ARANESP) injection - DIALYSIS  150 mcg Intravenous Q Tue-HD  . docusate  100 mg Per Tube BID  . feeding supplement (PROSource TF)  45 mL Per Tube TID  . insulin aspart  0-6 Units Subcutaneous Q4H  . labetalol  200 mg Oral TID  . mouth rinse  15 mL Mouth Rinse 10 times per day  . multivitamin  1 tablet Per Tube QHS  . pantoprazole sodium  40 mg Per Tube Q1200    have reviewed scheduled and prn medications.  Physical Exam: General: sedated on vent-  Continuous EEG Heart: RRR Lungs: CBS bilat Abdomen: obese, soft, non  tender Extremities: pitting edema Dialysis Access: TDC     05/22/2020,11:36 AM  LOS: 4 days

## 2020-05-22 NOTE — Progress Notes (Addendum)
Neurology Progress Note with PMHx of  has a past medical history of Bilateral hydronephrosis (2006), Cataract, Cervical cancer ([place]) (2006), Chronic kidney disease, Depression, ESRD on hemodialysis ([place]), Hypothyroidism, Insomnia, Iron deficiency anemia, Peripheral vascular disease ([place]), Secondary hyperparathyroidism (of renal origin), Transfusion history, and Wears glasses.   Initially consulted for: New onset seizures in the setting of acute encephalopathy w/ hypertensive urgency/emergency, hyponatremia, missed dialysis with uremia   Major interval events/Subjective: - MRI Brain noted worsening of PRES - 4L off HD yesterday - Acyclovir discontinued 3/8, HSV negative  Subjective: Mrs. Jerry is a 65 yo w/ with ESRD on dialysis, NHR presented with altered mental status and hypertension, had a seizure in the ED requiring intubation for airway protection. Patient Hgb increased 8.1>8.7 today. Patient remains obtunded despite discontinuation of Propofol, but appears to be responsive to noxious stimuli today. RN reports that night RN was able to get patient to respond to open her eyes to verbal stimuli at least once.   Yesterday patient received iHD and had elevated BP. Repeat MRI Brain noted worsening of PRES. Patient was also noted to fever within the past 24h. Tracheal lavage was sent and noted to have multiple organisms growing. Patient is afebrile this AM without starting abx.     Exam: Vitals:   05/22/20 0800 05/22/20 0900  BP: (!) 144/51 (!) 159/59  Pulse: 83 87  Resp: 20 (!) 22  Temp: 98.6 F (37 C) 98.78 F (37.1 C)  SpO2: 100% 100%   Gen: In bed, NAD, mucous secretions coming out corner of mouth Resp: non-labored breathing, no acute distress Abd: soft, nt Skin: Warm and dry Extremities: Edematous throughout   Mental Status: Patient is obtunded, today and this AM does not open eyes to verbal stimulation. Patient eyes continue to have roving movments.   Cranial Nerves: II: She  did not blink to threat pupils are equal, round, and reactive to light, sluggish, 2.5 mm to 2 mm III,IV, VI: Eyes are midline, and with roving eye movements V: VII: Corneals are intact and symmetric to eyelash brush VIII: Does not orient to voice XI/XII: Intact cough -- briskly grimaces to external mouth stimulation prior to reaching parynx so gag not assessed    Motor/sensory: Today she has trace movement to noxious stimulation in all 4 extremities. Patient does grimace to noxious stimuli, with symmetric facies. Babinski: No response bilaterally Cerebellar: Unable to assess secondary to patient's mental status    Pertinent Labs: MR BRAIN WO CONTRAST  Result Date: 05/22/2020 CLINICAL DATA:  Encephalopathy EXAM: MRI HEAD WITHOUT CONTRAST TECHNIQUE: Multiplanar, multiecho pulse sequences of the brain and surrounding structures were obtained without intravenous contrast. COMPARISON:  Brain MRI 05/19/2020 FINDINGS: Brain: Worsening of abnormal diffusion restriction along the cortices of posterior parietal lobes and the occipital lobes. Gyral edema has worsened. There are punctate foci of abnormal diffusion restriction within both frontal lobes. Normal white matter signal, parenchymal volume and CSF spaces. The midline structures are normal. Vascular: Major flow voids are preserved. Skull and upper cervical spine: Normal calvarium and skull base. Visualized upper cervical spine and soft tissues are normal. Sinuses/Orbits:No paranasal sinus fluid levels or advanced mucosal thickening. No mastoid or middle ear effusion. Normal orbits. IMPRESSION: 1. Worsening of abnormal diffusion restriction and cortical edema predominantly involving the posterior parietal lobes and occipital lobes, most consistent with PRES. Electronically Signed   By: Ulyses Jarred M.D.   On: 05/22/2020 01:42   MR CERVICAL SPINE WO CONTRAST  Result Date: 05/22/2020 CLINICAL DATA:  Seizure EXAM: MRI CERVICAL SPINE WITHOUT CONTRAST  TECHNIQUE: Multiplanar, multisequence MR imaging of the cervical spine was performed. No intravenous contrast was administered. COMPARISON:  None. FINDINGS: Alignment: Normal Vertebrae: No fracture, evidence of discitis, or bone lesion. Cord: Normal signal and morphology. Posterior Fossa, vertebral arteries, paraspinal tissues: Endotracheal and nasoesophageal intubation. Disc levels: C2-3: Unremarkable. C3-4: Small central disc protrusion without stenosis. C4-5: Unremarkable. C5-6: Small central disc protrusion contacting the ventral spinal cord. Mild spinal canal stenosis. C6-7: Small central disc protrusion effacing the ventral thecal sac. Mild spinal canal stenosis. C7-T1: Unremarkable. IMPRESSION: 1. No acute abnormality of the cervical spine. 2. Mild spinal canal stenosis at C5-6 and C6-7. Electronically Signed   By: Ulyses Jarred M.D.   On: 05/22/2020 01:45   CBC Latest Ref Rng & Units 05/22/2020 05/21/2020 05/20/2020  WBC 4.0 - 10.5 K/uL 10.6(H) 8.1 8.4  Hemoglobin 12.0 - 15.0 g/dL 8.7(L) 7.0(L) 8.5(L)  Hematocrit 36.0 - 46.0 % 26.5(L) 21.0(L) 25.3(L)  Platelets 150 - 400 K/uL 320 281 228   CMP Latest Ref Rng & Units 05/22/2020 05/21/2020 05/20/2020  Glucose 70 - 99 mg/dL 179(H) 116(H) 80  BUN 8 - 23 mg/dL 26(H) 29(H) 38(H)  Creatinine 0.44 - 1.00 mg/dL 3.37(H) 4.69(H) 6.55(H)  Sodium 135 - 145 mmol/L 135 133(L) 130(L)  Potassium 3.5 - 5.1 mmol/L 3.5 3.6 4.1  Chloride 98 - 111 mmol/L 99 97(L) 91(L)  CO2 22 - 32 mmol/L 25 26 23   Calcium 8.9 - 10.3 mg/dL 10.4(H) 9.3 9.4  Total Protein 6.5 - 8.1 g/dL - - -  Total Bilirubin 0.3 - 1.2 mg/dL - - -  Alkaline Phos 38 - 126 U/L - - -  AST 15 - 41 U/L - - -  ALT 0 - 44 U/L - - -   Tracheal lavage:  ABUNDANT WBC PRESENT, PREDOMINANTLY PMN  ABUNDANT GRAM POSITIVE COCCI IN PAIRS  FEW GRAM NEGATIVE COCCI  FEW GRAM NEGATIVE RODS  RARE GRAM POSITIVE RODS  Impression:  65 year old with ESRD on dialysis, NHR presented with altered mental status and  hypertension, had a seizure in the ED requiring intubation for airway protection. Patient is a chronic HD patient and receives Aranesp prior to HD. Aranesp is noted to have the adverse side effect of HTN. During iHD 4L are pulled from patient; however patient has significantly elevated BP during / prior to iHD to facilitate volume removal. Unfortunately with PRES patient cannot afford to have elevated BP. We have spoken with nephrology and suggested that patient have CVVHD to help decrease the risk for hypertension during HD. Patient will also have a new strict BP < 175mmgHg sys goal (MAP goal > 60) Overall, patient does seem to have some improvement on clinical exam as patient is noted to respond to noxious stimuli today. It is likely that iHD yesterday helped patient to eliminate the propofol.  MRI cervical spine was negative for acute process decreasing concern for compressive process .   Recommendations: - Strict systolic BP <528UXLK, MAP 60-75; Cleviprex ordered - Recommend transitioning to CVV HD - Neurology will continue to follow   Damita Dunnings, MD PGY-1  Attending Neurologist's note:  I personally saw this patient, gathering history, performing a full neurologic examination, reviewing relevant labs, personally reviewing relevant imaging including MRI brain and C-spine, and formulated the assessment and plan, adding to the above excellent note above for completeness and clarity to accurately reflect my thoughts  Lesleigh Noe MD-PhD [facility] [phone]  Available 7 AM to 7  PM, outside these hours please contact Neurologist on call listed on [place] Performed by: Lorenza Chick   Total critical care time: 33 minutes  Critical care time was exclusive of separately billable procedures and treating other patients and teaching/supervising residents/nurse practitioners.  Critical care was necessary to treat or prevent imminent or life-threatening  deterioration.  Critical care was time spent personally by me on the following activities: development of treatment plan with patient and/or surrogate as well as nursing, discussions with consultants, evaluation of patient's response to treatment, examination of patient, obtaining history from patient or surrogate, ordering and performing treatments and interventions, ordering and review of laboratory studies, ordering and review of radiographic studies, pulse oximetry and re-evaluation of patient's condition.

## 2020-05-22 NOTE — Progress Notes (Addendum)
Called by nursing to evaluate patient's movements.  She was coughing and having bilateral inward movements of the upper extremities with mild shaking.  This is similar to the movements that were captured on EEG recently, and did not have any electrographic correlate.  No change in seizure management needed at this time.  Of note the patient was having frequent coughing on the ventilator.  Now that she is clearing sedation with assistance of hemodialysis, and we have confirmed no seizure activity while her sedation was weaned, defer addition of sedation for ventilation to CCM team.  Neurology will continue to follow  An additional 12 minutes of critical care time were spent in reevaluation of the patient at this time  [place] [phone]

## 2020-05-22 NOTE — Progress Notes (Signed)
Transported pt from [place] to MRI from MRI back to [place]  . No complications noted.

## 2020-05-23 DIAGNOSIS — I161 Hypertensive emergency: Secondary | ICD-10-CM | POA: Diagnosis not present

## 2020-05-23 DIAGNOSIS — I6783 Posterior reversible encephalopathy syndrome: Secondary | ICD-10-CM | POA: Diagnosis not present

## 2020-05-23 DIAGNOSIS — R569 Unspecified convulsions: Secondary | ICD-10-CM | POA: Diagnosis not present

## 2020-05-23 LAB — RENAL FUNCTION PANEL
Albumin: 2.3 g/dL — ABNORMAL LOW (ref 3.5–5.0)
Albumin: 2.2 g/dL — ABNORMAL LOW (ref 3.5–5.0)
Anion gap: 8 (ref 5–15)
Anion gap: 6 (ref 5–15)
BUN: 24 mg/dL — ABNORMAL HIGH (ref 8–23)
BUN: 24 mg/dL — ABNORMAL HIGH (ref 8–23)
CO2: 27 mmol/L (ref 22–32)
CO2: 28 mmol/L (ref 22–32)
Calcium: 10.4 mg/dL — ABNORMAL HIGH (ref 8.9–10.3)
Calcium: 10.5 mg/dL — ABNORMAL HIGH (ref 8.9–10.3)
Chloride: 102 mmol/L (ref 98–111)
Chloride: 103 mmol/L (ref 98–111)
Creatinine, Ser: 2.08 mg/dL — ABNORMAL HIGH (ref 0.44–1.00)
Creatinine, Ser: 1.68 mg/dL — ABNORMAL HIGH (ref 0.44–1.00)
GFR, Estimated: 26 mL/min — ABNORMAL LOW (ref >60)
GFR, Estimated: 33 mL/min — ABNORMAL LOW (ref >60)
Glucose, Bld: 136 mg/dL — ABNORMAL HIGH (ref 70–99)
Glucose, Bld: 148 mg/dL — ABNORMAL HIGH (ref 70–99)
Phosphorus: 2.7 mg/dL (ref 2.5–4.6)
Phosphorus: 2.4 mg/dL — ABNORMAL LOW (ref 2.5–4.6)
Potassium: 4.3 mmol/L (ref 3.5–5.1)
Potassium: 4.3 mmol/L (ref 3.5–5.1)
Sodium: 137 mmol/L (ref 135–145)
Sodium: 137 mmol/L (ref 135–145)

## 2020-05-23 LAB — CBC
HCT: 28.2 % — ABNORMAL LOW (ref 36.0–46.0)
Hemoglobin: 9.2 g/dL — ABNORMAL LOW (ref 12.0–15.0)
MCH: 29.7 pg (ref 26.0–34.0)
MCHC: 32.6 g/dL (ref 30.0–36.0)
MCV: 91 fL (ref 80.0–100.0)
Platelets: 239 10*3/uL (ref 150–400)
RBC: 3.1 MIL/uL — ABNORMAL LOW (ref 3.87–5.11)
RDW: 17.2 % — ABNORMAL HIGH (ref 11.5–15.5)
WBC: 15.6 10*3/uL — ABNORMAL HIGH (ref 4.0–10.5)
nRBC: 0 % (ref 0.0–0.2)

## 2020-05-23 LAB — APTT: aPTT: 39 seconds — ABNORMAL HIGH (ref 24–36)

## 2020-05-23 LAB — GLUCOSE, CAPILLARY
Glucose-Capillary: 93 mg/dL (ref 70–99)
Glucose-Capillary: 84 mg/dL (ref 70–99)
Glucose-Capillary: 109 mg/dL — ABNORMAL HIGH (ref 70–99)
Glucose-Capillary: 100 mg/dL — ABNORMAL HIGH (ref 70–99)
Glucose-Capillary: 131 mg/dL — ABNORMAL HIGH (ref 70–99)
Glucose-Capillary: 88 mg/dL (ref 70–99)
Glucose-Capillary: 129 mg/dL — ABNORMAL HIGH (ref 70–99)
Glucose-Capillary: 90 mg/dL (ref 70–99)

## 2020-05-23 LAB — MAGNESIUM: Magnesium: 2.4 mg/dL (ref 1.7–2.4)

## 2020-05-23 LAB — TRIGLYCERIDES: Triglycerides: 199 mg/dL — ABNORMAL HIGH (ref <150)

## 2020-05-23 MED ORDER — POTASSIUM & SODIUM PHOSPHATES 280-160-250 MG PO PACK: 1.0000 | PACK | Freq: Three times a day (TID) | ORAL | Status: DC

## 2020-05-23 MED ORDER — PROSOURCE TF PO LIQD
45.0000 mL | Freq: Four times a day (QID) | ORAL | Status: DC
  Administered 2020-05-23 – 2020-05-31 (×31): 45 mL
  Filled 2020-05-23 (×30): qty 45

## 2020-05-23 MED ORDER — VITAL 1.5 CAL PO LIQD
1000.0000 mL | ORAL | Status: DC
  Administered 2020-05-23 – 2020-05-30 (×7): 1000 mL
  Filled 2020-05-23 (×5): qty 1000

## 2020-05-23 MED ORDER — LEVETIRACETAM IN NACL 1000 MG/100ML IV SOLN
1000.0000 mg | Freq: Two times a day (BID) | INTRAVENOUS | Status: DC
  Administered 2020-05-24 – 2020-05-25 (×3): 1000 mg via INTRAVENOUS
  Filled 2020-05-23 (×3): qty 100

## 2020-05-23 MED ORDER — SODIUM CHLORIDE 0.9% FLUSH
10.0000 mL | Freq: Two times a day (BID) | INTRAVENOUS | Status: DC
  Administered 2020-05-23 – 2020-05-31 (×14): 10 mL

## 2020-05-23 MED ORDER — POTASSIUM PHOSPHATES 15 MMOLE/5ML IV SOLN
15.0000 mmol | Freq: Once | INTRAVENOUS | Status: AC
  Administered 2020-05-23: 15 mmol via INTRAVENOUS
  Filled 2020-05-23: qty 5

## 2020-05-23 MED ORDER — POLYETHYLENE GLYCOL 3350 17 G PO PACK: 17.0000 g | PACK | Freq: Every day | ORAL | Status: DC | PRN

## 2020-05-23 MED ORDER — VANCOMYCIN HCL 1500 MG/300ML IV SOLN
1500.0000 mg | Freq: Once | INTRAVENOUS | Status: AC
  Administered 2020-05-23: 1500 mg via INTRAVENOUS
  Filled 2020-05-23: qty 300

## 2020-05-23 MED ORDER — VANCOMYCIN HCL 750 MG/150ML IV SOLN
750.0000 mg | INTRAVENOUS | Status: DC
  Administered 2020-05-24 – 2020-05-25 (×2): 750 mg via INTRAVENOUS
  Filled 2020-05-23 (×3): qty 150

## 2020-05-23 MED ORDER — SODIUM CHLORIDE 0.9% FLUSH
10.0000 mL | INTRAVENOUS | Status: DC | PRN
  Administered 2020-05-31: 10 mL

## 2020-05-23 NOTE — Progress Notes (Signed)
Rasheema from [facility] called requesting that case management contact her with any change in patient condition/status.

## 2020-05-23 NOTE — Progress Notes (Addendum)
Neurology Progress Note with PMHx of has a past medical history of Bilateral hydronephrosis (2006), Cataract, Cervical cancer ([place]) (2006), Chronic kidney disease, Depression, ESRD on hemodialysis ([place]), Hypothyroidism, Insomnia, Iron deficiency anemia, Peripheral vascular disease ([place]), Secondary hyperparathyroidism (of renal origin), Transfusion history, and Wears glasses.  Initially consulted for: New onset seizures in the setting of acute encephalopathy w/ hypertensive urgency/emergency, hyponatremia, missed dialysis with uremia  Major interval events/Subjective: - Transitioned to CRRT  - Propofol and fentanyl was restarted by CCM for pain/sedation control in the setting of intubation - WBC increase 10.6> 15.6  Subjective: [name] is a 65 yo w/with ESRD on dialysis, NHR presented with altered mental status and hypertension, had a seizure in the ED requiring intubation for airway protection. Patient had an episode yesterday that was intially concerning for posturing. Neurology saw patient after incident and concluded that the event did not appear to be seizure-like activity as it was reminiscent of activity seen while patient was on EEG. Actions seen were consistent with the previous behavior seen and likely 2/2 to patient beginning to clear her propofol and coughing due to irritation from tubing. On exam this AM patient does open her eyes to verbal stimuli and does not appear to cough from irritation. Patient was hooked up to CRRT and pressure was <169mmHg sys.  Exam: Vitals:   05/23/20 0900 05/23/20 0913  BP: 134/82   Pulse:  72  Resp: (!) 28   Temp:    SpO2:     Gen: In bed, NAD, some white coating noted on tongue where it touches tubing. Resp: non-labored breathing on vent, no acute distress Abd: soft, nt Skin: Warm and dry Extremities: Edematous throughout  Neuro: Patient isless obtunded, today and opens eyes to verbal stimulation, but remains unable to follow  commands. Cranial Nerves: II:She did blink to threat- reaction is sluggish- pupils are equal, round, and reactive to light, sluggish, 2.5 mm to 2 mm.  III,IV, EL:FYBO are midline,and move appropriately with passive movement of the head in all 4 directions V:VII:Corneals are intact  to light eyelid touch bilateral VIII: Does not orient to voice XI/XII: Patient appears more comfortable today, not coughing today    Motor/sensory: Todayshe has some flexion movement versus withdrawal to noxious stimuli in distal upper extremities, but does not respond to noxious stimuli in proximal upper extremities. Patient appeared to slightly move the toes in her RLE, possibly to command, but not reliably. Patient did not respond to noxious stimuli in the BLE.  Babinski: No response bilaterally Cerebellar: Unable to assess secondary to patient's mental status   Pertinent Labs: CBC Latest Ref Rng & Units 05/23/2020 05/22/2020 05/21/2020  WBC 4.0 - 10.5 K/uL 15.6(H) 10.6(H) 8.1  Hemoglobin 12.0 - 15.0 g/dL 9.2(L) 8.7(L) 7.0(L)  Hematocrit 36.0 - 46.0 % 28.2(L) 26.5(L) 21.0(L)  Platelets 150 - 400 K/uL 239 320 281   CMP Latest Ref Rng & Units 05/23/2020 05/22/2020 05/22/2020  Glucose 70 - 99 mg/dL 136(H) 148(H) 179(H)  BUN 8 - 23 mg/dL 24(H) 31(H) 26(H)  Creatinine 0.44 - 1.00 mg/dL 2.08(H) 3.06(H) 3.37(H)  Sodium 135 - 145 mmol/L 137 137 135  Potassium 3.5 - 5.1 mmol/L 4.3 4.2 3.5  Chloride 98 - 111 mmol/L 102 100 99  CO2 22 - 32 mmol/L 27 27 25   Calcium 8.9 - 10.3 mg/dL 10.4(H) 10.6(H) 10.4(H)  Total Protein 6.5 - 8.1 g/dL - - -  Total Bilirubin 0.3 - 1.2 mg/dL - - -  Alkaline Phos 38 -  126 U/L - - -  AST 15 - 41 U/L - - -  ALT 0 - 44 U/L - - -   Pertinent imaging: 3/6 MRI brain without contrast concerning for PRES 3/9 MRI brain without contrast with worsening diffusion restriction in the bilateral occipital lobes concerning for worsening PRES  Impression:  65 year old with ESRD on dialysis, NHR  presented with altered mental status and hypertension, had a seizure in the ED requiring intubation for airway protection. Patient neuro exam is improved today as patient opened her eyes to verbal stimuli today and there was possible minimal movement in the RLE witnessed by team and RN. Propofol was started again on the patient at the discretion of CCM after patient appeared more uncomfortable yesterday. While patient was off of her propofol EEG was able conclude that patient was not having seizure activity, which is reassuring. Patient was transitioned to CRRT for better blood pressure control and has been able to maintain goal sys BP <154mmHg (occasional excursions to 150s in the setting of coughing). Patient appears to have some improvements in her exam as her BP is more tightly controlled and she continues to undergo CVVHD.   Recommendations: -Strict systolicBP <412INOM, MAP 76-72;[id number] Cleviprex  - Appreciate infectious workup/treatment per CCM team with increase in WBC 10.6>15.6 - Appreciate nephrology continue to manage CVVHD - Increase Keppra to 1000 mg twice daily while on CRRT,   - When back on iHD, return to 500 mg BID with an extra 500 post-dialysis on each dialysis day -Neurology will continue to follow  Damita Dunnings, MD PGY-1  Attending Neurologist's note:  I personally saw this patient, gathering history, performing a full neurologic examination, reviewing relevant labs, personally reviewing relevant imaging as detailed above, and formulated the assessment and plan, adding the note above for completeness and clarity to accurately reflect my thoughts  CRITICAL CARE Performed by: Lorenza Chick   Total critical care time: 32 minutes  Critical care time was exclusive of separately billable procedures and treating other patients.  Critical care was necessary to treat or prevent imminent or life-threatening deterioration.  Critical care was time spent personally by me  on the following activities: development of treatment plan with patient and/or surrogate as well as nursing, discussions with consultants, evaluation of patient's response to treatment, examination of patient, obtaining history from patient or surrogate, ordering and performing treatments and interventions, ordering and review of laboratory studies, ordering and review of radiographic studies, pulse oximetry and re-evaluation of patient's condition.

## 2020-05-23 NOTE — Progress Notes (Signed)
Pharmacy Antibiotic Note  Mary Wilcox is a 65 y.o. female admitted on 06/06/2020 with AMS.  S/p acyclovir for rule out encephalitis.  Now with Staph PNA and Pharmacy has been consulted for vancomycin dosing.  Patient has ESRD on TTS HD, but was transitioned to CRRT.  Hypothermic, WBC up to 15.6.  Plan: Vanc 1500mg  IV x 1, then 750mg  IV Q24H Monitor CRRT interruption/tolerance, micro data to narrow abx vs check vanc level  Height: 4\' 11"  (149.9 cm) Weight: 74.6 kg (164 lb 7.4 oz) IBW/kg (Calculated) : 43.2  Temp (24hrs), Avg:97.5 F (36.4 C), Min:96.2 F (35.7 C), Max:99.14 F (37.3 C)  Recent Labs  Lab 05/19/20 1534 05/20/20 0357 05/21/20 0347 05/22/20 0406 05/22/20 1711 05/23/20 0356  WBC 9.5 8.4 8.1 10.6*  --  15.6*  CREATININE  --  6.55* 4.69* 3.37* 3.06* 2.08*    Estimated Creatinine Clearance: 23.4 mL/min (A) (by C-G formula based on SCr of 2.08 mg/dL (H)).    Allergies  Allergen Reactions  . Prednisone     Other reaction(s): severe muscle weakness in legs  . Prozac [Fluoxetine Hcl] Hives    3/6 MRSA PCR - negative 3/8 TA - Staph aureus (preliminary)  Yesenia Fontenette D. Mina Marble, PharmD, BCPS, [place] 05/23/2020, 3:00 PM

## 2020-05-23 NOTE — Progress Notes (Signed)
NAME:  DIANIA CO, MRN:  [id number], DOB:  Jan 27, 1956, LOS: 5 ADMISSION DATE:  05/23/2020, CONSULTATION DATE:  05/23/2020  REFERRING MD:  Shelly Coss, ED PA  CHIEF COMPLAINT: Altered mental status, seizure  Brief History:  65 year old with ESRD on dialysis, NHR presented with altered mental status and hypertension, had a seizure in the ED requiring intubation for airway protection  Past Medical History:  ESRD on HD T/TH/S PAD Hypothyroidism Cervical cancer 2006 status post radiation  Significant Hospital Events:  3/5 5 admitted with mental status change.  Had witnessed seizure while getting EEG requiring intubation during propofol load and Keppra load 3/6:MRI, pleated showing bilateral posterior cerebral cortical swelling and restricted diffusion fever and complicated posterior reversible encephalopathy syndrome or seizure phenomenon.  Got hypotensive during dialysis required left subclavian triple-lumen catheter placement.  Started on norepinephrine.  Got 1 unit blood. Stopped [place] heparin 3/7: EEG negative for seizure. GCS 3.  Weaning sedating medications.  Getting dialysis.  Weaning norepinephrine still hypotensive. core track for nutrition. got extra treatment of dialysis 3/8: EEG still negative for seizure but showing diffuse slowing.  Grimaces and opens eyes to noxious stimulus but still not following commands.  Failed pressure support attempts.  Sputum sent. Had to be started on Cleviprex as SBP > 160. Stopped acyclovir as HSV was neg. hgb drifted down gm no source of bleeding. Got another unit blood.  3/9 repeat MRI showing worsening PRES. BP better controlled on Cleviprex. Started labetolol VT in effort to wean celeviprex. Neuro marginally better. Actually opened eyes to voice.  3/10 BP has been better since starting Cleviprex and as needed labetalol.  Neuro exam somewhat unchanged from yesterday  Consults:  Renal Neurology  Procedures:  ETT 3/5 >> Lt [place] CVL 3/6 >>  Significant  Diagnostic Tests:  CT angio head/ neck >> stented left subclavian and axillary veins with stent occlusion MRI brain 3/5 >>Bilateral posterior cerebral cortical swelling and restricted diffusion, favor complicated PRES vs seizure LTM EEG 3/6 >> moderate diffuse encephalopathy CSF 3/5 >> normal   Micro Data:  CSF 3/5 >> ng  Antimicrobials:   acyclovir (stopped 3/8)  Interim History / Subjective:  She only briefly opens her eyes to voice commands  Objective   Blood pressure (!) 128/110, pulse 69, temperature 97.8 F (36.6 C), temperature source Oral, resp. rate (!) 27, height 4\' 11"  (1.499 m), weight 73.6 kg, SpO2 100 %. CVP:  [4 mmHg] 4 mmHg  Vent Mode: PRVC FiO2 (%):  [30 %] 30 % Set Rate:  [20 bmp] 20 bmp Vt Set:  [350 mL] 350 mL PEEP:  [5 cmH20] 5 cmH20 Plateau Pressure:  [13 cmH20-15 cmH20] 13 cmH20   Intake/Output Summary (Last 24 hours) at 05/23/2020 0644 Last data filed at 05/23/2020 0600 Gross per 24 hour  Intake 1742.49 ml  Output 1905 ml  Net -162.51 ml   Filed Weights   05/20/20 0500 05/20/20 0700 05/22/20 0413  Weight: 82 kg 82 kg 73.6 kg    Examination: Const: Stuporous elderly appearing woman, only open eyes briefly to voice commands HEENT: Atraumatic, normocephalic Resp: CTABL, no wheezes, crackles, rhonchi CV: RRR, no murmurs, gallop, rub Abd: Bowel sounds present, nondistendedn Ext: No lower extremity edema, skin is cool to touch Neuro: Stuporous, rigid bilateral upper extremities, opening eyes briefly to voice commands, pupils equal and reactive to light, cranial nerve and strength difficult to assess given her current condition   Resolved Hospital Problem list   Hyperkalemia, resolved with urgent dialysis on  3/5 Hypotensive   Assessment & Plan:   #Acute metabolic encephalopathy with seizure in the setting of PRES  There has not been much change neurologically from yesterday.  She would open her eyes briefly to voice commands.  Her bilateral  upper extremities are somewhat rigid when flexed.  Does not respond to painful stimuli though the propofol somewhat is a confounding factor.  Plan -Cont keppra (pharmacy resident suggest increasing to 750 mg her renal function has improved) -Strict BP goals <140 cont cleviprex and added Labetolol  -Continue CRRT -Cont serial neuro checks -Avoid fevers   #Acute respiratory failure in the setting of ineffective airway protection with seizure Remains intubated due to inability to protect her airway from ongoing neurological insult  Plan -Daily SAT & SBT as needed and allowed based on mentation -LTVV, 4-8cc/kg IBW with goal Pplat <30 and DP<15. -VAP prevention protocol -PAD protocol for sedation; minimize as much as possible -Wean FiO2 as able to maintain SpO2 >90% as needed and allowed based on mentation   #Hypertensive emergency on admission  Plan -Cont cleviprex and labetolol as above -BP goal <140   #ESRD on HD, missed HD on 3/3 #Acute hyponatremia-resolved  Plan -CRRT per nephro -Hold Aranesp   #Acute on Chronic Normocytic Anemia.  Likely 2/2 chronic inflammatory disease process from ESRD, hypothyroidism, HTN Hgb this am 9.2<<8.7, Received 1 unit PRBCs on 3/6.  And again on 3/8 for hgb of 7.  Plan -Follow-up daily CBC -Transfuse for hemoglobin goal >7   #Hyperglycemia  Plan -SSI  Best practice (evaluated daily)  Diet: npo, ordering core track 3/7 and tubefeeds Pain/Anxiety/Delirium protocol (if indicated): Propofol /Fent , goal RASS-1,Changing RASS goal to 0 VAP protocol (if indicated): Y DVT prophylaxis: SQ heparin-->hold scds GI prophylaxis: protonix Glucose control: SSI Mobility: Bed rest Disposition:ICU  Goals of Care:  Last date of multidisciplinary goals of care discussion: Still pending--> will reach out to family today  Nathanial Rancher, PGY-3 Internal Medicine Teaching Service

## 2020-05-23 NOTE — Progress Notes (Signed)
Subjective: seen in ICU, not responsive, on CRRT, I/O even last 24 hrs.   Objective Vital signs in last 24 hours: Vitals:   05/23/20 0913 05/23/20 1000 05/23/20 1027 05/23/20 1100  BP:  (!) 128/113  (!) 142/60  Pulse: 72 70 65 65  Resp:  (!) 28 (!) 27 (!) 27  Temp:      TempSrc:      SpO2:  100% 99% 97%  Weight:      Height:       Weight change: 1 kg  Intake/Output Summary (Last 24 hours) at 05/23/2020 1145 Last data filed at 05/23/2020 1100 Gross per 24 hour  Intake 1886.78 ml  Output 2600 ml  Net -713.22 ml  Physical Exam: General: sedated on vent Heart: RRR Lungs: CBS bilat Abdomen: obese, soft, non tender Extremities: edema resolved bilat LE's Dialysis Access: TDC      OP HD: TTS AF  3h 57mn  75.5kg  2/2 bath Hep 7400  RIJ TDC Mircera 60 MCG q 2wks last given 05/07/20  Venofer 50 weekly  Assessment/Plan 1. HTN crisis/ PRES by MRI/ seizures: due to missing HD and chronic vol overload d/t poor compliance. Repeat MRI 3/9 showed worsening PRES. We transitioned pt from iHD to CRRT 3/9 to avoid the large volume shifts of regular HD. Continues on po labetalol and IV cleviprex w/ goal  SBP< 140 per neurology.  2. Vol overload: 7- 8 kg up on admission w/ pulm edema/ resp failure. Most of excess vol now removed. FU CXR w/o edema. Cont UF 50- 100 cc/hr as BP tolerates w/ CRRT. 2. ESRD - on HD since 2012. HD TTS.  Getting CRRT 3/9 - current, as above 3. Anemia of ESRD - Hgb 8's.  SP darbe 150ug on 3/08 here > will hold further esa until BP's are under control (given +PRES) and off IV meds.   4. Metabolic bone disease - calcium 8.3 no vitamin D at outpatient unit, continue calcium acetate and Sensipar when taking p.o.'s 5. Nutrition -ALB 3.3, currently n.p.o. 6. FTT - [facility]     RKelly Splinter MD 05/23/2020, 11:45 AM       Labs: Basic Metabolic Panel: Recent Labs  Lab 05/22/20 0406 05/22/20 1711 05/23/20 0356  NA 135 137 137  K 3.5  4.2 4.3  CL 99 100 102  CO2 '25 27 27  ' GLUCOSE 179* 148* 136*  BUN 26* 31* 24*  CREATININE 3.37* 3.06* 2.08*  CALCIUM 10.4* 10.6* 10.4*  PHOS 3.1 2.9 2.7   Liver Function Tests: Recent Labs  Lab 06/11/2020 0700 05/19/2020 1347 05/20/20 0357 05/22/20 0406 05/22/20 1711 05/23/20 0356  AST 13* 21  --   --   --   --   ALT 10 11  --   --   --   --   ALKPHOS 94 103  --   --   --   --   BILITOT 0.7 0.9  --   --   --   --   PROT 6.5 6.5  --   --   --   --   ALBUMIN 3.3* 3.2*   < > 2.4* 2.4* 2.3*   < > = values in this interval not displayed.   Recent Labs  Lab 05/17/2020 1347  LIPASE 33  AMYLASE 32   Recent Labs  Lab 05/22/2020 1022  AMMONIA 16   CBC: Recent Labs  Lab 06/12/2020 0700 06/03/2020 0845 05/19/20 1534 05/19/20 2200 05/20/20 0357  05/21/20 0347 05/22/20 0406 05/23/20 0356  WBC 15.2*   < > 9.5  --  8.4 8.1 10.6* 15.6*  NEUTROABS 13.4*  --   --   --   --   --   --   --   HGB 10.5*   < > 6.7*   < > 8.5* 7.0* 8.7* 9.2*  HCT 30.5*   < > 20.5*   < > 25.3* 21.0* 26.5* 28.2*  MCV 89.4   < > 91.1  --  87.8 88.2 90.1 91.0  PLT 310   < > 190  --  228 281 320 239   < > = values in this interval not displayed.   Medications: Infusions: .  prismasol BGK 4/2.5 400 mL/hr at 05/23/20 0148  .  prismasol BGK 4/2.5 200 mL/hr at 05/22/20 1239  . sodium chloride 10 mL/hr at 05/23/20 1100  . sodium chloride    . clevidipine Stopped (05/22/20 2002)  . feeding supplement (VITAL 1.5 CAL) 45 mL/hr at 05/23/20 1100  . [START ON 05/24/2020] levETIRAcetam    . prismasol BGK 4/2.5 1,800 mL/hr at 05/23/20 0757  . propofol (DIPRIVAN) infusion 8 mcg/kg/min (05/23/20 1100)    Scheduled Medications: . chlorhexidine gluconate (MEDLINE KIT)  15 mL Mouth Rinse BID  . Chlorhexidine Gluconate Cloth  6 each Topical Daily  . docusate  100 mg Per Tube BID  . feeding supplement (PROSource TF)  45 mL Per Tube TID  . insulin aspart  0-6 Units Subcutaneous Q4H  . labetalol  200 mg Per Tube TID  .  mouth rinse  15 mL Mouth Rinse 10 times per day  . multivitamin  1 tablet Per Tube QHS  . nystatin  5 mL Mouth/Throat QID  . pantoprazole sodium  40 mg Per Tube Q1200    have reviewed scheduled and prn medications.

## 2020-05-23 NOTE — Progress Notes (Signed)
Nutrition Follow-up  DOCUMENTATION CODES:   Not applicable  INTERVENTION:   Tube feeding via Cortrak: Vital 1.5 at 50 ml/h (1200 ml per day) Prosource TF 45 ml QID  Provides 1960 kcal, 125 gm protein, 916 ml free water daily  -Continue Rena-vit via tube daily  NUTRITION DIAGNOSIS:   Increased nutrient needs related to chronic illness (ESRD on CRRT) as evidenced by estimated needs.  Ongoing.  GOAL:   Patient will meet greater than or equal to 90% of their needs  Met with tube feeds.  MONITOR:   Vent status,TF tolerance,Labs  REASON FOR ASSESSMENT:   Ventilator    ASSESSMENT:   65 yo female admitted from SNF with AMS, HTN, and had a seizure in the ED requiring intubation. PMH includes ESRD on HD, cervical cancer, secondary hyperparathyroidism, anemia, PVD, hypothyroidism.  3/7 - Cortrak placed; dialysis  3/7&3/8 - EEG negative for seizure  3/9 - MRI showing worsening PRES; transition to CRRT   RN reports that pt is at goal rate and tolerating tube feeds well.  Patient is currently intubated on ventilator support. MV: 11.1 L/min Temp (24hrs), Avg:97.5 F (36.4 C), Min:96.2 F (35.7 C), Max:99.14 F (37.3 C) MAP: 62-123 Propofol: 4.4 ml/hr Cleviprex: currently off  EDW: 75.5 kg. Pt's weights reviewed and have shown weight fluctuation since admission likely due to fluid.  Admit weight: 176# Current weight: 164.46#  Meds reviewed: Colace (BID), Rena-vit, Keprra, Propofol Labs reviewed: CBG (84-132)  I&O's reviewed: -5,046.3 since admission  Diet Order:   Diet Order            Diet NPO time specified  Diet effective now                 EDUCATION NEEDS:   No education needs have been identified at this time  Skin:  Skin Assessment: Reviewed RN Assessment  Last BM:  3/9 (type 7)  Height:   Ht Readings from Last 1 Encounters:  05/19/20 '4\' 11"'  (1.499 m)    Weight:   Wt Readings from Last 1 Encounters:  05/23/20 74.6 kg    Ideal Body  Weight:  44.7 kg  BMI:  Body mass index is 33.22 kg/m.  Estimated Nutritional Needs:   Kcal:  1800-2000  Protein:  115-130 g  Fluid:  1 L + UOP   Salvadore Oxford, Dietetic Intern 05/23/2020 3:03 PM

## 2020-05-24 DIAGNOSIS — R579 Shock, unspecified: Secondary | ICD-10-CM

## 2020-05-24 DIAGNOSIS — I6783 Posterior reversible encephalopathy syndrome: Secondary | ICD-10-CM | POA: Diagnosis not present

## 2020-05-24 LAB — CBC
HCT: 25.3 % — ABNORMAL LOW (ref 36.0–46.0)
Hemoglobin: 8.2 g/dL — ABNORMAL LOW (ref 12.0–15.0)
MCH: 29.7 pg (ref 26.0–34.0)
MCHC: 32.4 g/dL (ref 30.0–36.0)
MCV: 91.7 fL (ref 80.0–100.0)
Platelets: 213 10*3/uL (ref 150–400)
RBC: 2.76 MIL/uL — ABNORMAL LOW (ref 3.87–5.11)
RDW: 17.3 % — ABNORMAL HIGH (ref 11.5–15.5)
WBC: 11.5 10*3/uL — ABNORMAL HIGH (ref 4.0–10.5)
nRBC: 1.4 % — ABNORMAL HIGH (ref 0.0–0.2)

## 2020-05-24 LAB — RENAL FUNCTION PANEL
Albumin: 2.2 g/dL — ABNORMAL LOW (ref 3.5–5.0)
Albumin: 2.3 g/dL — ABNORMAL LOW (ref 3.5–5.0)
Anion gap: 8 (ref 5–15)
Anion gap: 8 (ref 5–15)
BUN: 29 mg/dL — ABNORMAL HIGH (ref 8–23)
BUN: 24 mg/dL — ABNORMAL HIGH (ref 8–23)
CO2: 27 mmol/L (ref 22–32)
CO2: 26 mmol/L (ref 22–32)
Calcium: 10.7 mg/dL — ABNORMAL HIGH (ref 8.9–10.3)
Calcium: 10.8 mg/dL — ABNORMAL HIGH (ref 8.9–10.3)
Chloride: 101 mmol/L (ref 98–111)
Chloride: 102 mmol/L (ref 98–111)
Creatinine, Ser: 1.59 mg/dL — ABNORMAL HIGH (ref 0.44–1.00)
Creatinine, Ser: 1.34 mg/dL — ABNORMAL HIGH (ref 0.44–1.00)
GFR, Estimated: 36 mL/min — ABNORMAL LOW (ref >60)
GFR, Estimated: 44 mL/min — ABNORMAL LOW (ref >60)
Glucose, Bld: 153 mg/dL — ABNORMAL HIGH (ref 70–99)
Glucose, Bld: 198 mg/dL — ABNORMAL HIGH (ref 70–99)
Phosphorus: 3.9 mg/dL (ref 2.5–4.6)
Phosphorus: 3.2 mg/dL (ref 2.5–4.6)
Potassium: 4.8 mmol/L (ref 3.5–5.1)
Potassium: 4.6 mmol/L (ref 3.5–5.1)
Sodium: 136 mmol/L (ref 135–145)
Sodium: 136 mmol/L (ref 135–145)

## 2020-05-24 LAB — APTT: aPTT: 37 seconds — ABNORMAL HIGH (ref 24–36)

## 2020-05-24 LAB — CULTURE, RESPIRATORY W GRAM STAIN

## 2020-05-24 LAB — GLUCOSE, CAPILLARY
Glucose-Capillary: 139 mg/dL — ABNORMAL HIGH (ref 70–99)
Glucose-Capillary: 143 mg/dL — ABNORMAL HIGH (ref 70–99)
Glucose-Capillary: 138 mg/dL — ABNORMAL HIGH (ref 70–99)
Glucose-Capillary: 146 mg/dL — ABNORMAL HIGH (ref 70–99)
Glucose-Capillary: 161 mg/dL — ABNORMAL HIGH (ref 70–99)
Glucose-Capillary: 199 mg/dL — ABNORMAL HIGH (ref 70–99)
Glucose-Capillary: 192 mg/dL — ABNORMAL HIGH (ref 70–99)

## 2020-05-24 LAB — MAGNESIUM: Magnesium: 2.6 mg/dL — ABNORMAL HIGH (ref 1.7–2.4)

## 2020-05-24 LAB — POCT ACTIVATED CLOTTING TIME: Activated Clotting Time: 148 seconds

## 2020-05-24 MED ORDER — SODIUM CHLORIDE 0.9 % IV SOLN
600.0000 [IU]/h | INTRAVENOUS | Status: DC
  Administered 2020-05-24 – 2020-05-25 (×2): 600 [IU]/h via INTRAVENOUS_CENTRAL
  Filled 2020-05-24 (×3): qty 2

## 2020-05-24 MED ORDER — HEPARIN SODIUM (PORCINE) 1000 UNIT/ML DIALYSIS
1000.0000 [IU] | INTRAMUSCULAR | Status: DC | PRN
  Filled 2020-05-24: qty 6

## 2020-05-24 MED ORDER — HEPARIN BOLUS VIA INFUSION (CRRT)
1000.0000 [IU] | INTRAVENOUS | Status: DC | PRN
  Filled 2020-05-24: qty 1000

## 2020-05-24 NOTE — Progress Notes (Signed)
Subjective: seen in ICU, not responsive, on CRRT, I/O even last 24 hrs.   Objective Vital signs in last 24 hours: Vitals:   05/24/20 0900 05/24/20 1000 05/24/20 1100 05/24/20 1200  BP: (!) 131/59 140/61 132/78 (!) 149/74  Pulse: 74 81 80 82  Resp: 19 (!) 24 (!) 26 (!) 31  Temp:      TempSrc:      SpO2: 99% 100% 100% 100%  Weight:      Height:       Weight change: 0.7 kg  Intake/Output Summary (Last 24 hours) at 05/24/2020 1357 Last data filed at 05/24/2020 1200 Gross per 24 hour  Intake 1638.75 ml  Output 2867 ml  Net -1228.25 ml  Physical Exam: General: sedated on vent Heart: RRR Lungs: CBS bilat Abdomen: obese, soft, non tender Extremities: edema resolved bilat LE's Dialysis Access: TDC      OP HD: TTS AF  3h 76mn  75.5kg  2/2 bath Hep 7400  RIJ TDC Mircera 60 MCG q 2wks last given 05/07/20  Venofer 50 weekly  Assessment/Plan 1. HTN crisis/ PRES by MRI/ seizures: hx of missing HD/ chronic vol overload/ poor compliance. Repeat MRI 3/9 showed worsening PRES. We transitioned pt from iHD to CRRT 3/9 to avoid the large volume shifts of regular HD. BP's better and off cleviprex today for 1st time. Is getting ng labetalol 200 tid and prn meds.   2. Vol overload: on admission was up 8kg, down to dry wt now. FU CXR w/o edema.  2. ESRD - on HD since 2012. HD TTS.  CRRT started on 3/09. Will run CRRT another 24 hrs or so then should be able to resume iHD.  3. Anemia of ESRD - Hgb 8's.  SP darbe 150ug on 3/08 here. Will resume next week now that BP's under control.   4. Metabolic bone disease - calcium 8.3 no vitamin D at outpatient unit, continue calcium acetate and Sensipar when taking p.o.'s 5. Nutrition -ALB 3.3, currently n.p.o. 6. FTT - [facility]     RKelly Splinter MD 05/24/2020, 1:57 PM       Labs: Basic Metabolic Panel: Recent Labs  Lab 05/23/20 0356 05/23/20 1531 05/24/20 0349  NA 137 137 136  K 4.3 4.3 4.8  CL 102 103 101  CO2  _0 GLUCOSE 136* 148* 153*  BUN 24* 24* 29*  CREATININE 2.08* 1.68* 1.59*  CALCIUM 10.4* 10.5* 10.7*  PHOS 2.7 2.4* 3.9   Liver Function Tests: Recent Labs  Lab 06/12/2020 0700 05/19/2020 1347 05/20/20 0357 05/23/20 0356 05/23/20 1531 05/24/20 0349  AST 13* 21  --   --   --   --   ALT 10 11  --   --   --   --   ALKPHOS 94 103  --   --   --   --   BILITOT 0.7 0.9  --   --   --   --   PROT 6.5 6.5  --   --   --   --   ALBUMIN 3.3* 3.2*   < > 2.3* 2.2* 2.2*   < > = values in this interval not displayed.   Recent Labs  Lab 06/12/2020 1347  LIPASE 33  AMYLASE 32   Recent Labs  Lab 05/17/2020 1022  AMMONIA 16   CBC: Recent Labs  Lab 06/13/2020 0700 05/23/2020 0845 05/20/20 0357 05/21/20 0347 05/22/20 0406 05/23/20 0356 05/24/20 0349  WBC 15.2*   < >  8.4 8.1 10.6* 15.6* 11.5*  NEUTROABS 13.4*  --   --   --   --   --   --   HGB 10.5*   < > 8.5* 7.0* 8.7* 9.2* 8.2*  HCT 30.5*   < > 25.3* 21.0* 26.5* 28.2* 25.3*  MCV 89.4   < > 87.8 88.2 90.1 91.0 91.7  PLT 310   < > 228 281 320 239 213   < > = values in this interval not displayed.   Medications: Infusions: .  prismasol BGK 4/2.5 400 mL/hr at 05/23/20 1623  .  prismasol BGK 4/2.5 200 mL/hr at 05/23/20 1459  . sodium chloride 10 mL/hr at 05/24/20 1200  . sodium chloride    . feeding supplement (VITAL 1.5 CAL) 50 mL/hr at 05/23/20 2000  . heparin 10,000 units/ 20 mL infusion syringe 600 Units/hr (05/24/20 1031)  . levETIRAcetam    . prismasol BGK 4/2.5 1,800 mL/hr at 05/24/20 1140  . propofol (DIPRIVAN) infusion Stopped (05/24/20 0953)  . vancomycin      Scheduled Medications: . chlorhexidine gluconate (MEDLINE KIT)  15 mL Mouth Rinse BID  . Chlorhexidine Gluconate Cloth  6 each Topical Daily  . docusate  100 mg Per Tube BID  . feeding supplement (PROSource TF)  45 mL Per Tube QID  . insulin aspart  0-6 Units Subcutaneous Q4H  . labetalol  200 mg Per Tube TID  . mouth rinse  15 mL Mouth Rinse 10 times per day   . multivitamin  1 tablet Per Tube QHS  . nystatin  5 mL Mouth/Throat QID  . pantoprazole sodium  40 mg Per Tube Q1200  . sodium chloride flush  10-40 mL Intracatheter Q12H    have reviewed scheduled and prn medications.

## 2020-05-24 NOTE — Progress Notes (Signed)
NAME:  Mary Wilcox, MRN:  [id number], DOB:  Aug 21, 1955, LOS: 6 ADMISSION DATE:  05/28/2020, CONSULTATION DATE:  05/24/2020  REFERRING MD:  Shelly Coss, ED PA  CHIEF COMPLAINT: Altered mental status, seizure  Brief History:  65 year old with ESRD on dialysis, NHR presented with altered mental status and hypertension, had a seizure in the ED requiring intubation for airway protection  Past Medical History:  ESRD on HD T/TH/S PAD Hypothyroidism Cervical cancer 2006 status post radiation  Significant Hospital Events:  3/5 5 admitted with mental status change.  Had witnessed seizure while getting EEG requiring intubation during propofol load and Keppra load 3/6:MRI, pleated showing bilateral posterior cerebral cortical swelling and restricted diffusion fever and complicated posterior reversible encephalopathy syndrome or seizure phenomenon.  Got hypotensive during dialysis required left subclavian triple-lumen catheter placement.  Started on norepinephrine.  Got 1 unit blood. Stopped [place] heparin 3/7: EEG negative for seizure. GCS 3.  Weaning sedating medications.  Getting dialysis.  Weaning norepinephrine still hypotensive. core track for nutrition. got extra treatment of dialysis 3/8: EEG still negative for seizure but showing diffuse slowing.  Grimaces and opens eyes to noxious stimulus but still not following commands.  Failed pressure support attempts.  Sputum sent. Had to be started on Cleviprex as SBP > 160. Stopped acyclovir as HSV was neg. hgb drifted down gm no source of bleeding. Got another unit blood.  3/9 repeat MRI showing worsening PRES. BP better controlled on Cleviprex. Started labetolol VT in effort to wean celeviprex. Neuro marginally better. Actually opened eyes to voice.  3/10 BP has been better since starting Cleviprex and as needed labetalol.  Neuro exam somewhat unchanged from yesterday  Consults:  Renal Neurology  Procedures:  ETT 3/5 >> Lt Jaconita CVL 3/6 >>  Significant  Diagnostic Tests:  CT angio head/ neck >> stented left subclavian and axillary veins with stent occlusion MRI brain 3/5 >>Bilateral posterior cerebral cortical swelling and restricted diffusion, favor complicated PRES vs seizure LTM EEG 3/6 >> moderate diffuse encephalopathy CSF 3/5 >> normal   Micro Data:  CSF 3/5 >> ng  Antimicrobials:  Acyclovir (stopped 3/8) 3/10 Vancomycin >  Interim History / Subjective:  Very limited response. Unable to respond to verbal commands.   Objective   Blood pressure 120/64, pulse 69, temperature 99.3 F (37.4 C), resp. rate (!) 24, height 4\' 11"  (1.499 m), weight 75.3 kg, SpO2 100 %.    Vent Mode: PRVC FiO2 (%):  [30 %] 30 % Set Rate:  [20 bmp] 20 bmp Vt Set:  [350 mL] 350 mL PEEP:  [5 cmH20] 5 cmH20 Pressure Support:  [10 cmH20] 10 cmH20 Plateau Pressure:  [13 cmH20-20 cmH20] 13 cmH20   Intake/Output Summary (Last 24 hours) at 05/24/2020 0634 Last data filed at 05/24/2020 0600 Gross per 24 hour  Intake 1780.63 ml  Output 3356 ml  Net -1575.37 ml   Filed Weights   05/22/20 0413 05/23/20 0500 05/24/20 0349  Weight: 73.6 kg 74.6 kg 75.3 kg    Examination: Const: Stuporous elderly appearing woman, Will grimace to painful stimuli HEENT: Pupils round and reactive to light, atraumatic, normocephalic Resp: CTABL, no wheezes, crackles, rhonchi CV: RRR, no murmurs, gallop, rub Abd: Bowel sounds present, nondistended Ext: Clogged left upper extremity fistula, unable to feel or auscultate thrill  Neuro: She remains stuporous, will grimace to painful stimuli, rigid bilateral extremities, unable to follow commands   Resolved Hospital Problem list   Hyperkalemia, resolved with urgent dialysis on 3/5 Hypotensive  Assessment & Plan:   #Acute metabolic encephalopathy with seizure in the setting of PRES  Remains stuporous, grimaces to painful stimuli  Plan -Cont keppra 1000mg  BID, 500 BID on iHD days -Appreciate neurology  recommendations -Strict BP goals <140 cont cleviprex and added Labetolol  -Continue CRRT -Cont serial neuro checks -Avoid fevers   #Acute respiratory failure in the setting of ineffective airway protection with seizure  Plan -Daily SAT & SBT as needed and allowed based on mentation -LTVV, 4-8cc/kg IBW with goal Pplat <30 and DP<15. -VAP prevention protocol -PAD protocol for sedation; minimize as much as possible -Wean FiO2 as able to maintain SpO2 >90% as needed and allowed based on mentation   #Hypertensive emergency on admission  Plan -Wean cleveprex  -P.o labetalol 200mg  TID -IV Labetalol prn -BP goal <140   #ESRD on HD, missed HD on 3/3 #Acute hyponatremia-resolved  Plan -CRRT per nephro -Hold Aranesp -Heparinized dialysis catheter   #Acute on Chronic Normocytic Anemia.  Likely 2/2 chronic inflammatory disease process from ESRD, hypothyroidism, HTN Hgb this am 8.2<9.2<<8.7, Received 1 unit PRBCs on 3/6.  And again on 3/8 for hgb of 7.  Plan -Follow-up daily CBC -Transfuse for hemoglobin goal >7   #Hyperglycemia  Plan -SSI -Goal BG 140-180  Best practice (evaluated daily)  Diet: npo, ordering core track 3/7 and tubefeeds Pain/Anxiety/Delirium protocol (if indicated): Propofol /Fent , goal RASS-1,Changing RASS goal to 0 VAP protocol (if indicated): Y DVT prophylaxis: SQ heparin-->hold scds GI prophylaxis: protonix Glucose control: SSI Mobility: Bed rest Disposition:ICU  Goals of Care:  Last date of multidisciplinary goals of care discussion: Still pending--> will reach out to family today and consult medicine.  Patient presents from [facility] [facility], PGY-3 Internal Medicine Teaching Service

## 2020-05-24 NOTE — Progress Notes (Signed)
Neurology Progress Note ID: Mary Wilcox is a 65 y.o. with history of ESRD on iHD (intermittent adherence) due to obstructive hydronephrosis from cervical cancer who was found confused on 06/01/2020, now being managed for PRES.   Initially consulted for: New onset seizures in the setting of acute encephalopathy w/ hypertensive urgency/emergency, hyponatremia, missed dialysis with uremia  Major workup: 3/5 LP: RBC 26, WBC 2, Protein 60, Glucose 95 (serum 234)  Gram stain negative  HSV negative 3/5 EEG with L frontal seizure 4.5 minutes long 3/6 - 3/9 LTM with weaning sedation negative for further seizures, intermittent myoclonic movements with bilateral upper extremity extension (typically w/ coughing) are not epileptic  3/6 MRI brain without contrast concerning for PRES 3/9 MRI brain without contrast with worsening diffusion restriction in the bilateral occipital lobes concerning for worsening PRES  Major interval events/Subjective: - Started on Vancomycin for PNA  - Minimally responsive   Exam: Vitals:   05/24/20 0600 05/24/20 0700  BP: 120/64 (!) 116/48  Pulse: 69 68  Resp: (!) 24 (!) 24  Temp:    SpO2: 100% 100%   Gen: In bed, NAD, some white coating noted on tongue where it touches tubing. Resp: non-labored breathing on vent, no acute distress Abd: soft, nt Skin: Warm and dry Extremities: Edema improving  Neuro: Mental status: Today closes eyes when asked to open them but protrudes tongue and wiggles bilateral toes to command Cranial Nerves: II:She did not blink to threat- pupils are equal, round, and reactive to light, 3 mm to 2 mm.  III,IV, UV:OZDG are again with intermittent right gaze preference, VOR intact  V:VII:Corneals are intact  to light eyelid touch bilateral VIII: Does not orient to voice XI/XII: Intact cough, gag intact per nursing Motor/sensory: Wiggles toes to command bilaterally slightly more briskly on the left. Equal grimace to noxious stim in  BUE with trace movement Babinski: Downgoing on the right, mute on the left  Cerebellar: Unable to assess secondary to patient's mental statusand weakness    Pertinent Labs: CBC Latest Ref Rng & Units 05/24/2020 05/23/2020 05/22/2020  WBC 4.0 - 10.5 K/uL 11.5(H) 15.6(H) 10.6(H)  Hemoglobin 12.0 - 15.0 g/dL 8.2(L) 9.2(L) 8.7(L)  Hematocrit 36.0 - 46.0 % 25.3(L) 28.2(L) 26.5(L)  Platelets 150 - 400 K/uL 213 239 320   CMP Latest Ref Rng & Units 05/24/2020 05/23/2020 05/23/2020  Glucose 70 - 99 mg/dL 153(H) 148(H) 136(H)  BUN 8 - 23 mg/dL 29(H) 24(H) 24(H)  Creatinine 0.44 - 1.00 mg/dL 1.59(H) 1.68(H) 2.08(H)  Sodium 135 - 145 mmol/L 136 137 137  Potassium 3.5 - 5.1 mmol/L 4.8 4.3 4.3  Chloride 98 - 111 mmol/L 101 103 102  CO2 22 - 32 mmol/L 27 28 27   Calcium 8.9 - 10.3 mg/dL 10.7(H) 10.5(H) 10.4(H)  Total Protein 6.5 - 8.1 g/dL - - -  Total Bilirubin 0.3 - 1.2 mg/dL - - -  Alkaline Phos 38 - 126 U/L - - -  AST 15 - 41 U/L - - -  ALT 0 - 44 U/L - - -     Impression:  Slowly recovering from PRES w/ BP control and CVVHD  Recommendations: -Strict systolicBP <644IHKV, MAP 42-59;DGLOVFIE Cleviprex  - Appreciate infectious workup/treatment per CCM team with increase in WBC 10.6>15.6 - Appreciate nephrology continue to manage CVVHD - Increase Keppra to 1000 mg twice daily while on CRRT,   - When back on iHD, return to 500 mg BID with an extra 500 post-dialysis on each dialysis day -Neurology  will continue to follow   CRITICAL CARE Performed by: Lorenza Chick  Total critical care time: 31 minutes  Critical care time was exclusive of separately billable procedures and treating other patients.  Critical care was necessary to treat or prevent imminent or life-threatening deterioration.  Critical care was time spent personally by me on the following activities: development of treatment plan with patient and/or surrogate as well as nursing, discussions with consultants, evaluation  of patient's response to treatment, examination of patient, obtaining history from patient or surrogate, ordering and performing treatments and interventions, ordering and review of laboratory studies, ordering and review of radiographic studies, pulse oximetry and re-evaluation of patient's condition.

## 2020-05-24 NOTE — Progress Notes (Signed)
[facility] Progress Note Patient Name: Mary Wilcox DOB: 10/28/1955 MRN: [id number]   Date of Service  05/24/2020  HPI/Events of Note  Diarrhea - Nursing request for Flexiseal.   eICU Interventions  Will order Flexiseal.      Intervention Category Major Interventions: Other:  Lysle Dingwall 05/24/2020, 4:13 AM

## 2020-05-25 ENCOUNTER — Inpatient Hospital Stay (HOSPITAL_COMMUNITY): Payer: Medicare Other

## 2020-05-25 ENCOUNTER — Other Ambulatory Visit: Payer: Self-pay

## 2020-05-25 ENCOUNTER — Other Ambulatory Visit (HOSPITAL_COMMUNITY): Payer: Self-pay

## 2020-05-25 DIAGNOSIS — Z992 Dependence on renal dialysis: Secondary | ICD-10-CM | POA: Diagnosis not present

## 2020-05-25 DIAGNOSIS — I428 Other cardiomyopathies: Secondary | ICD-10-CM

## 2020-05-25 DIAGNOSIS — R579 Shock, unspecified: Secondary | ICD-10-CM | POA: Diagnosis not present

## 2020-05-25 DIAGNOSIS — Z66 Do not resuscitate: Secondary | ICD-10-CM | POA: Diagnosis not present

## 2020-05-25 DIAGNOSIS — Z515 Encounter for palliative care: Secondary | ICD-10-CM

## 2020-05-25 DIAGNOSIS — I6783 Posterior reversible encephalopathy syndrome: Secondary | ICD-10-CM | POA: Diagnosis not present

## 2020-05-25 DIAGNOSIS — N186 End stage renal disease: Secondary | ICD-10-CM | POA: Diagnosis not present

## 2020-05-25 DIAGNOSIS — R569 Unspecified convulsions: Secondary | ICD-10-CM | POA: Diagnosis not present

## 2020-05-25 LAB — RENAL FUNCTION PANEL
Albumin: 2.2 g/dL — ABNORMAL LOW (ref 3.5–5.0)
Anion gap: 13 (ref 5–15)
BUN: 22 mg/dL (ref 8–23)
CO2: 23 mmol/L (ref 22–32)
Calcium: 11.6 mg/dL — ABNORMAL HIGH (ref 8.9–10.3)
Chloride: 101 mmol/L (ref 98–111)
Creatinine, Ser: 1.12 mg/dL — ABNORMAL HIGH (ref 0.44–1.00)
GFR, Estimated: 54 mL/min — ABNORMAL LOW (ref >60)
Glucose, Bld: 176 mg/dL — ABNORMAL HIGH (ref 70–99)
Phosphorus: 4.1 mg/dL (ref 2.5–4.6)
Potassium: 5.1 mmol/L (ref 3.5–5.1)
Sodium: 137 mmol/L (ref 135–145)

## 2020-05-25 LAB — HEPATIC FUNCTION PANEL
ALT: 1244 U/L — ABNORMAL HIGH (ref 0–44)
AST: 1580 U/L — ABNORMAL HIGH (ref 15–41)
Albumin: 2.4 g/dL — ABNORMAL LOW (ref 3.5–5.0)
Alkaline Phosphatase: 84 U/L (ref 38–126)
Bilirubin, Direct: 0.3 mg/dL — ABNORMAL HIGH (ref 0.0–0.2)
Indirect Bilirubin: 0.8 mg/dL (ref 0.3–0.9)
Total Bilirubin: 1.1 mg/dL (ref 0.3–1.2)
Total Protein: 6.1 g/dL — ABNORMAL LOW (ref 6.5–8.1)

## 2020-05-25 LAB — APTT: aPTT: 59 seconds — ABNORMAL HIGH (ref 24–36)

## 2020-05-25 LAB — POCT I-STAT 7, (LYTES, BLD GAS, ICA,H+H)
Acid-Base Excess: 0 mmol/L (ref 0.0–2.0)
Bicarbonate: 24.2 mmol/L (ref 20.0–28.0)
Calcium, Ion: 1.49 mmol/L — ABNORMAL HIGH (ref 1.15–1.40)
HCT: 25 % — ABNORMAL LOW (ref 36.0–46.0)
Hemoglobin: 8.5 g/dL — ABNORMAL LOW (ref 12.0–15.0)
O2 Saturation: 100 %
Patient temperature: 95.9
Potassium: 5 mmol/L (ref 3.5–5.1)
Sodium: 135 mmol/L (ref 135–145)
TCO2: 25 mmol/L (ref 22–32)
pCO2 arterial: 33.5 mmHg (ref 32.0–48.0)
pH, Arterial: 7.461 — ABNORMAL HIGH (ref 7.350–7.450)
pO2, Arterial: 330 mmHg — ABNORMAL HIGH (ref 83.0–108.0)

## 2020-05-25 LAB — GLUCOSE, CAPILLARY
Glucose-Capillary: 148 mg/dL — ABNORMAL HIGH (ref 70–99)
Glucose-Capillary: 93 mg/dL (ref 70–99)
Glucose-Capillary: 129 mg/dL — ABNORMAL HIGH (ref 70–99)
Glucose-Capillary: 148 mg/dL — ABNORMAL HIGH (ref 70–99)
Glucose-Capillary: 153 mg/dL — ABNORMAL HIGH (ref 70–99)
Glucose-Capillary: 175 mg/dL — ABNORMAL HIGH (ref 70–99)

## 2020-05-25 LAB — CBC
HCT: 25.6 % — ABNORMAL LOW (ref 36.0–46.0)
Hemoglobin: 8.6 g/dL — ABNORMAL LOW (ref 12.0–15.0)
MCH: 30.3 pg (ref 26.0–34.0)
MCHC: 33.6 g/dL (ref 30.0–36.0)
MCV: 90.1 fL (ref 80.0–100.0)
Platelets: 200 10*3/uL (ref 150–400)
RBC: 2.84 MIL/uL — ABNORMAL LOW (ref 3.87–5.11)
RDW: 17.2 % — ABNORMAL HIGH (ref 11.5–15.5)
WBC: 26.4 10*3/uL — ABNORMAL HIGH (ref 4.0–10.5)
nRBC: 10.5 % — ABNORMAL HIGH (ref 0.0–0.2)

## 2020-05-25 LAB — MAGNESIUM: Magnesium: 2.7 mg/dL — ABNORMAL HIGH (ref 1.7–2.4)

## 2020-05-25 LAB — ECHOCARDIOGRAM LIMITED
AR max vel: 0.92 cm2
AV Area VTI: 0.91 cm2
AV Area mean vel: 0.93 cm2
AV Mean grad: 13 mmHg
AV Peak grad: 26.8 mmHg
Ao pk vel: 2.59 m/s
Area-P 1/2: 2.52 cm2
Height: 59 in
S' Lateral: 1.9 cm
Weight: 2649.05 oz

## 2020-05-25 LAB — PROCALCITONIN: Procalcitonin: 4.29 ng/mL

## 2020-05-25 LAB — PROTIME-INR
INR: 1.5 — ABNORMAL HIGH (ref 0.8–1.2)
Prothrombin Time: 17.7 seconds — ABNORMAL HIGH (ref 11.4–15.2)

## 2020-05-25 LAB — LACTIC ACID, PLASMA
Lactic Acid, Venous: 3.9 mmol/L (ref 0.5–1.9)
Lactic Acid, Venous: 2.9 mmol/L (ref 0.5–1.9)

## 2020-05-25 IMAGING — DX DG CHEST 1V PORT
1 series · 1 of 1 positions shown · non-contrast
Comparison: [DATE]

CLINICAL DATA: Respiratory distress

EXAM:
PORTABLE CHEST 1 VIEW

[chest]
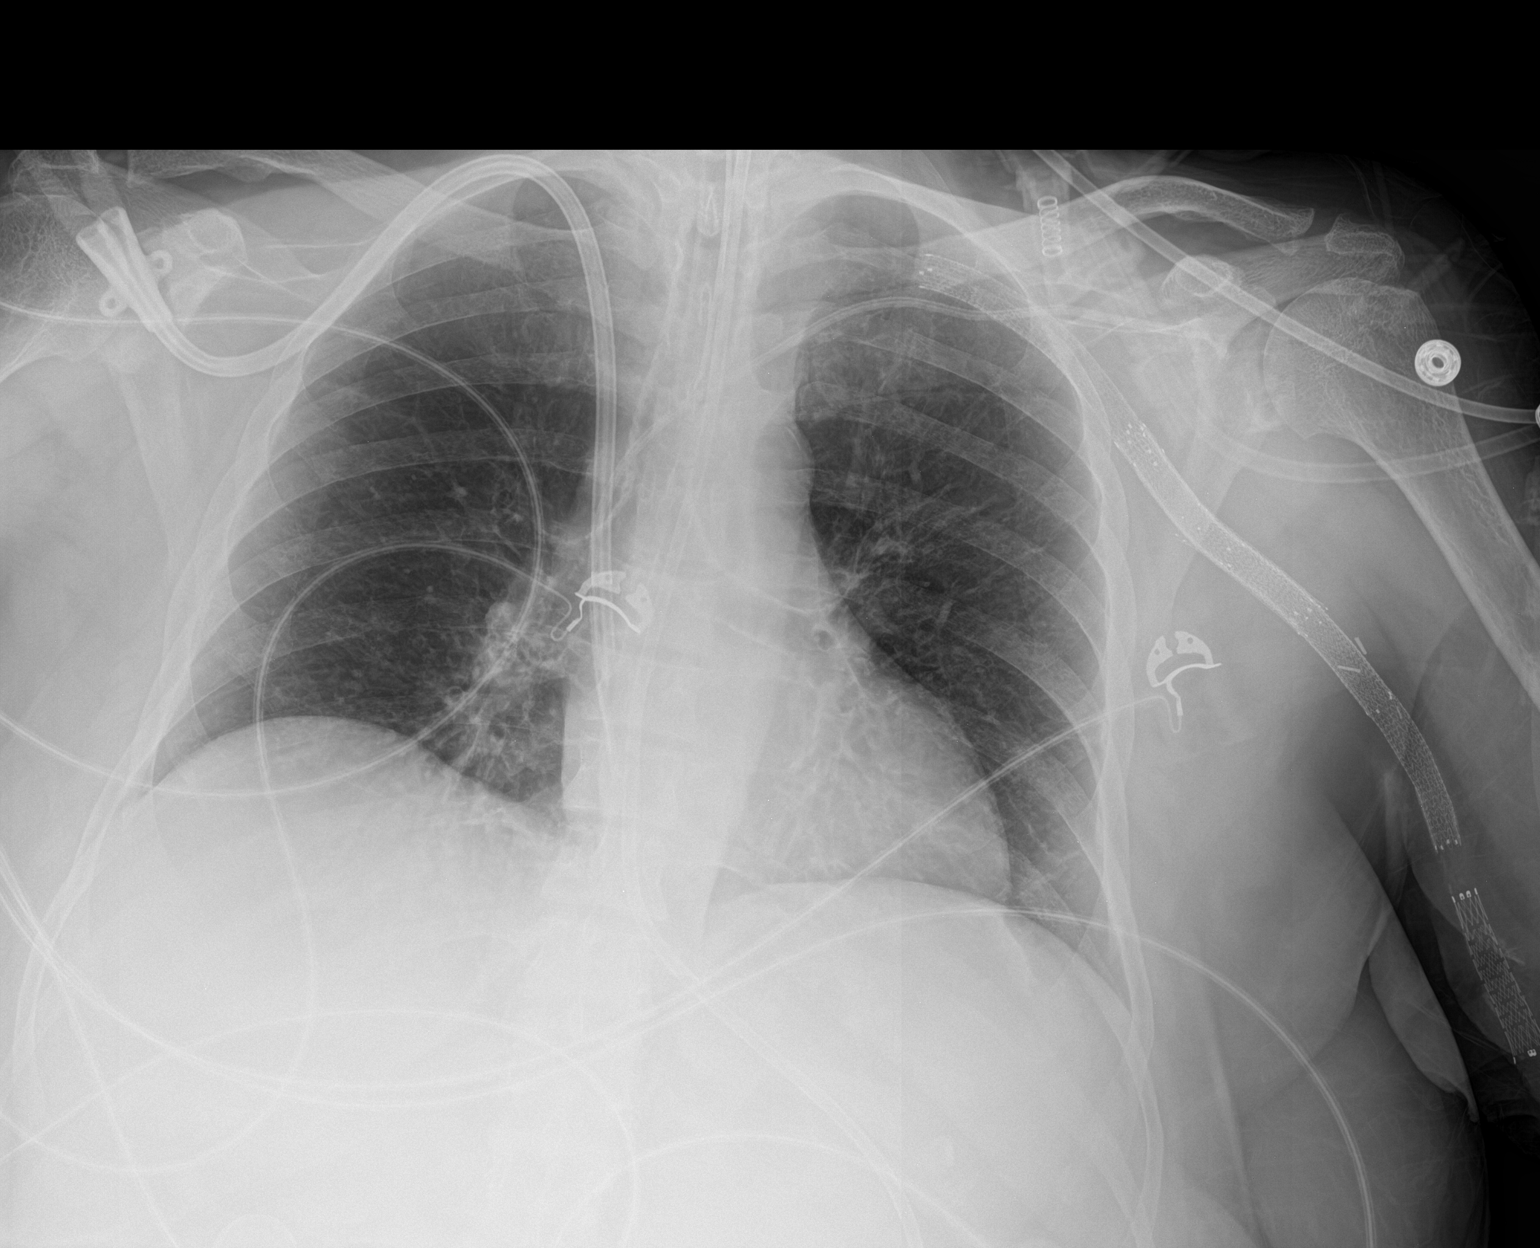

[1 of 1 positions shown; findings below may reference images not displayed]

FINDINGS: Bilateral central lines, the ETT, and a left vascular stent are
stable. The cardiomediastinal silhouette is stable. No pneumothorax.
The lungs are clear. A feeding tube terminates below today's film.
IMPRESSION: 1. Support apparatus as above.
2. No other acute abnormalities.

## 2020-05-25 MED ORDER — NOREPINEPHRINE 16 MG/250ML-% IV SOLN
0.0000 ug/min | INTRAVENOUS | Status: DC
  Administered 2020-05-25: 12.8 ug/min via INTRAVENOUS
  Administered 2020-05-25 (×2): 50 ug/min via INTRAVENOUS
  Filled 2020-05-25 (×3): qty 250

## 2020-05-25 MED ORDER — ALBUMIN HUMAN 25 % IV SOLN
25.0000 g | Freq: Four times a day (QID) | INTRAVENOUS | Status: AC
  Administered 2020-05-25 – 2020-05-26 (×3): 25 g via INTRAVENOUS
  Filled 2020-05-25 (×3): qty 100

## 2020-05-25 MED ORDER — ALBUMIN HUMAN 5 % IV SOLN
INTRAVENOUS | Status: AC
  Administered 2020-05-25: 12.5 g via INTRAVENOUS
  Filled 2020-05-25: qty 250

## 2020-05-25 MED ORDER — FENTANYL 2500MCG IN NS 250ML (10MCG/ML) PREMIX INFUSION
25.0000 ug/h | INTRAVENOUS | Status: DC
  Administered 2020-05-25: 25 ug/h via INTRAVENOUS
  Filled 2020-05-25: qty 250

## 2020-05-25 MED ORDER — SODIUM CHLORIDE 0.9 % IV SOLN
250.0000 mL | INTRAVENOUS | Status: DC
  Administered 2020-05-25 – 2020-05-29 (×2): 250 mL via INTRAVENOUS

## 2020-05-25 MED ORDER — ALBUMIN HUMAN 5 % IV SOLN: 12.5000 g | Freq: Once | INTRAVENOUS | Status: AC

## 2020-05-25 MED ORDER — NOREPINEPHRINE 4 MG/250ML-% IV SOLN
0.0000 ug/min | INTRAVENOUS | Status: DC
  Administered 2020-05-25 (×2): 40 ug/min via INTRAVENOUS
  Filled 2020-05-25: qty 250

## 2020-05-25 MED ORDER — FENTANYL CITRATE (PF) 100 MCG/2ML IJ SOLN
25.0000 ug | Freq: Once | INTRAMUSCULAR | Status: AC
  Administered 2020-05-30: 25 ug via INTRAVENOUS
  Filled 2020-05-25 (×2): qty 2

## 2020-05-25 MED ORDER — NOREPINEPHRINE 4 MG/250ML-% IV SOLN: 2.0000 ug/min | INTRAVENOUS | Status: DC

## 2020-05-25 MED ORDER — SODIUM CHLORIDE 0.9 % IV SOLN
2.0000 g | Freq: Two times a day (BID) | INTRAVENOUS | Status: DC
  Administered 2020-05-25 – 2020-05-26 (×3): 2 g via INTRAVENOUS
  Filled 2020-05-25 (×3): qty 2

## 2020-05-25 MED ORDER — NOREPINEPHRINE 4 MG/250ML-% IV SOLN
0.0000 ug/min | INTRAVENOUS | Status: DC
Start: 2020-05-25 — End: 2020-05-25

## 2020-05-25 MED ORDER — FENTANYL BOLUS VIA INFUSION
25.0000 ug | INTRAVENOUS | Status: DC | PRN
Start: 2020-05-25 — End: 2020-05-30
  Filled 2020-05-25: qty 25

## 2020-05-25 MED ORDER — VASOPRESSIN 20 UNITS/100 ML INFUSION FOR SHOCK
0.0400 [IU]/min | INTRAVENOUS | Status: DC
  Administered 2020-05-25 – 2020-05-26 (×3): 0.04 [IU]/min via INTRAVENOUS
  Filled 2020-05-25 (×2): qty 100

## 2020-05-25 MED ORDER — HYDROCORTISONE NA SUCCINATE PF 100 MG IJ SOLR
50.0000 mg | Freq: Four times a day (QID) | INTRAMUSCULAR | Status: DC
  Administered 2020-05-25 – 2020-05-27 (×8): 50 mg via INTRAVENOUS
  Filled 2020-05-25 (×8): qty 2

## 2020-05-25 NOTE — Procedures (Addendum)
Patient Name:Mary Wilcox SUG:[id number] Epilepsy Attending:Travarius Lange Barbra Sarks Referring Physician/Provider:Dr Lesleigh Noe Duration:05/25/2020 1312 to 05/26/2020 1312  Patient history:65 year old female being evaluated for altered mental status. EEG to evaluate for seizure  Level of alertness:comatose  AEDs during EEG study:LEV  Technical aspects: This EEG study was done with scalp electrodes positioned according to the 10-20 International system of electrode placement. Electrical activity was acquired at a sampling rate of 500Hz  and reviewed with a high frequency filter of 70Hz  and a low frequency filter of 1Hz . EEG data were recorded continuously and digitally stored.   Description:EEG showed continuous generalized 2-3Hz  high amplitude delta slowing. Periodic discharges with triphasic morphology at 0.5 to 1 Hz were noted, and appeared more sharply contoured when patient was stimulated.  ABNORMALITY - Periodic discharges with triphasic morphology, generalized -Continuousslow, generalized  IMPRESSION: This study is suggestive of severe diffuse encephalopathy, nonspecific etiology.Generalized periodic discharges with triphasic morphology were noted at 0.5-1Hz  which is on the ictal-interictal continuum with low to intermediate potential for seizures. No seizures were seen throughout the recording.  [name] Barbra Sarks

## 2020-05-25 NOTE — Progress Notes (Signed)
Neurology Progress Note ID: MARLOW HENDRIE is a 65 y.o. with history of ESRD on iHD (intermittent adherence) due to obstructive hydronephrosis from cervical cancer who was found confused on 06/11/2020, now being managed for PRES.   Initially consulted for: New onset seizures in the setting of acute encephalopathy w/ hypertensive urgency/emergency, hyponatremia, missed dialysis with uremia  Major workup: 3/5 LP: RBC 26, WBC 2, Protein 60, Glucose 95 (serum 234)  Gram stain negative  HSV negative 3/5 EEG with L frontal seizure 4.5 minutes long 3/6 - 3/9 LTM with weaning sedation negative for further seizures, intermittent myoclonic movements with bilateral upper extremity extension (typically w/ coughing) are not epileptic  3/6 MRI brain without contrast concerning for PRES 3/9 MRI brain without contrast with worsening diffusion restriction in the bilateral occipital lobes concerning for worsening PRES  Major interval events/Subjective: - Continues on Vancomycin for PNA  - hypoxic to 82% this monring and hypotensive to 60s/50s - Ordered for albumin 37.5 g - Started on levophed for BP support - continues on CRRT  - Minimally responsive   Exam:  Vitals:   05/25/20 0700 05/25/20 0702  BP: (!) 68/57 (!) 68/57  Pulse: 75   Resp: (!) 47 (!) 47  Temp:    SpO2: (!) 82%    Gen: In bed, NAD,  Resp: Respiratory rate in the 50s, but not coughing today Abd: soft, nt Skin: Warm and dry Extremities: Edema markedly improved  Neuro: Mental status: Opens eyes to noxious stimulation.  Does not follow any commands today but does keep eyes open Cranial Nerves: II:She did not blink to threat- pupils are equal, round, and reactive to light, 3 mm to 2 mm.  III,IV, ZO:XWRU are again with intermittent right gaze preference, VOR intact  V:VII:Corneals are intact  to light eyelid touch bilateral VIII: Does not orient to voice XI/XII: Intact cough, gag intact per nursing Motor/sensory: Grimace  to noxious stimulation in all 4 extremities Babinski: Today mute bilaterally (previously downgoing on the right) Cerebellar: Unable to assess secondary to patient's mental statusand weakness    Pertinent Labs: CBC Latest Ref Rng & Units 05/24/2020 05/23/2020 05/22/2020  WBC 4.0 - 10.5 K/uL 11.5(H) 15.6(H) 10.6(H)  Hemoglobin 12.0 - 15.0 g/dL 8.2(L) 9.2(L) 8.7(L)  Hematocrit 36.0 - 46.0 % 25.3(L) 28.2(L) 26.5(L)  Platelets 150 - 400 K/uL 213 239 320   CMP Latest Ref Rng & Units 05/25/2020 05/24/2020 05/24/2020  Glucose 70 - 99 mg/dL 176(H) 198(H) 153(H)  BUN 8 - 23 mg/dL 22 24(H) 29(H)  Creatinine 0.44 - 1.00 mg/dL 1.12(H) 1.34(H) 1.59(H)  Sodium 135 - 145 mmol/L 137 136 136  Potassium 3.5 - 5.1 mmol/L 5.1 4.6 4.8  Chloride 98 - 111 mmol/L 101 102 101  CO2 22 - 32 mmol/L 23 26 27   Calcium 8.9 - 10.3 mg/dL 11.6(H) 10.8(H) 10.7(H)  Total Protein 6.5 - 8.1 g/dL - - -  Total Bilirubin 0.3 - 1.2 mg/dL - - -  Alkaline Phos 38 - 126 U/L - - -  AST 15 - 41 U/L - - -  ALT 0 - 44 U/L - - -     Impression:  Slowly recovering from PRES w/ BP control and CVVHD, now complicated by recurrent hypotension.  Given her worsening examination today, I think repeating LTM for today will be useful.  It is possible that she may have further cleared sedation given that she has now been on CVVHD, and I want to rule out that the intermittent right gaze  is seizure activity  Recommendations: -Strict systolicBP <[id number], MAP 58-25; - Appreciate CCM team workup and management of hypotension, in discussion infectious work-up is ongoing, suspicion is for a narrow window of euvolemia for this patient - Appreciate nephrology continued management of CVVHD - Continue Keppra to 1000 mg twice daily while on CRRT,   - When back on iHD, return to 500 mg BID with an extra 500 post-dialysis on each dialysis day - Repeat LTM today  -Neurology will continue to follow   CRITICAL CARE Performed by: Lorenza Chick  Total critical care time: 41 minutes   Critical care time was exclusive of separately billable procedures and treating other patients.  Critical care was necessary to treat or prevent imminent or life-threatening deterioration.  Critical care was time spent personally by me on the following activities: development of treatment plan with patient and/or surrogate as well as nursing, discussions with consultants, evaluation of patient's response to treatment, examination of patient, obtaining history from patient or surrogate, ordering and performing treatments and interventions, ordering and review of laboratory studies, ordering and review of radiographic studies, pulse oximetry and re-evaluation of patient's condition.

## 2020-05-25 NOTE — Progress Notes (Signed)
° °                                                                                                                                                     °                                                   °  Daily Progress Note   Patient Name: Mary Wilcox       Date: 05/25/2020 DOB: 05/04/1955  Age: 65 y.o. MRN#: [id number] Attending Physician: Candee Furbish, MD Primary Care Physician: Kristen Loader, FNP Admit Date: 05/24/2020  Reason for Consultation/Follow-up: To discuss complex medical decision making related to patient's goals of care  Subjective: Spoke with patient's daughter [name] on the phone.  [name] is traveling and out of town.  She told me that [name] has two children.  She has a son with whom she has not been in touch with for many years.  [name] is the patient's Air traffic controller.    She tells me that her mother has been refusing treatment for about 1.5 years.  The nurses at [facility] indicate to [place] that her mother often refuses treatment there.  [name] believes her mother's depression compounds this problem.  Heather commented that she and her mother have never had discussions about what medical interventions she may want or what she would want at end of life.  She mentions that Juliann Pulse doesn't talk with her much and its been a while since she's seen her.  I explained that PRES means she's had swelling in her brain and it may be reversible - however she had a second MRI that indicated hers was getting worse.  There is a fear that while the swelling may be reversible some of the damage it may be causing may not be reversible.  We won't really know unless we place a tracheostomy and feeding tube and let her ride it out for several months.     I shared with [name] that the doctors and RNs are doing a lot right now to keep her mother alive and given that she has been refusing treatment for 1.5 years - would she really want that?  [name] felt her mother may not  want all of this intervention.  I asked about code status and [name] felt that if her mother stopped breathing on the ventilator or her heart stopped she would not want resuscitation.  We decided to continue full scope treatment otherwise thru the weekend and then have further conversations next week.  Florentina Jenny, PA-C Palliative Medicine Office:  [phone]  Additional 20 min.

## 2020-05-25 NOTE — Progress Notes (Signed)
Subjective: seen in ICU, not responsive. Had rough night w/ hypotension and hypothermia. Now on pressor support.   Objective Vital signs in last 24 hours: Vitals:   05/25/20 1100 05/25/20 1142 05/25/20 1145 05/25/20 1200  BP: (!) 100/48   (!) 150/70  Pulse:      Resp: (!) 27  (!) 34 (!) 37  Temp:   (!) 96.5 F (35.8 C)   TempSrc:   Axillary   SpO2:  98%    Weight:      Height:       Weight change: -0.2 kg  Intake/Output Summary (Last 24 hours) at 05/25/2020 1251 Last data filed at 05/25/2020 1203 Gross per 24 hour  Intake 2697.44 ml  Output 2692 ml  Net 5.44 ml  Physical Exam: General: sedated on vent Heart: RRR Lungs: CBS bilat Abdomen: obese, soft, non tender Extremities: edema resolved bilat LE's Dialysis Access: TDC      OP HD: TTS AF  3h 45min  75.5kg  2/2 bath Hep 7400  RIJ TDC Mircera 60 MCG q 2wks last given 05/07/20  Venofer 50 weekly  Assessment/Plan 1. Shock / hypothermia - last night BP's dropped abruptly and is now on pressor support. Unclear cause.  2. HTN crisis/ PRES by MRI/ seizures: hx of missing HD/ chronic vol overload/ poor compliance. Repeat MRI 3/9 showed worsening PRES. We transitioned pt from iHD to CRRT 3/9 to avoid the large volume shifts of regular HD. BP's better and off cleviprex.   3. ESRD - on HD since 2012. HD TTS.  CRRT started on 3/09. Will hold CRRT for next 24-48 hrs, see if this may help hypothermia. Well dialyzed currently.  4. Vol overload: on admission was up 8kg >>  down to dry wt now. FU CXR w/o edema. 5. Anemia of ESRD - Hgb 8's.  SP darbe 150ug on 3/08 here. Will resume next week now that BP's under control.   6. Metabolic bone disease - calcium 8.3 no vitamin D at outpatient unit, continue calcium acetate and Sensipar when taking p.o.'s 7. Nutrition -ALB 3.3, currently n.p.o. 8. FTT - chronic SNF resident at [facility].     Rob Schertz, MD 05/25/2020, 12:51 PM       Labs: Basic Metabolic Panel: Recent Labs  Lab  05/24/20 0349 05/24/20 1726 05/25/20 0544 05/25/20 0757  NA 136 136 137 135  K 4.8 4.6 5.1 5.0  CL 101 102 101  --   CO2 27 26 23  --   GLUCOSE 153* 198* 176*  --   BUN 29* 24* 22  --   CREATININE 1.59* 1.34* 1.12*  --   CALCIUM 10.7* 10.8* 11.6*  --   PHOS 3.9 3.2 4.1  --    Liver Function Tests: Recent Labs  Lab 06/11/2020 1347 05/20/20 0357 05/24/20 1726 05/25/20 0544 05/25/20 0810  AST 21  --   --   --  1,580*  ALT 11  --   --   --  1,244*  ALKPHOS 103  --   --   --  84  BILITOT 0.9  --   --   --  1.1  PROT 6.5  --   --   --  6.1*  ALBUMIN 3.2*   < > 2.3* 2.2* 2.4*   < > = values in this interval not displayed.   Recent Labs  Lab 05/23/2020 1347  LIPASE 33  AMYLASE 32   No results for input(s): AMMONIA in the last 168 hours.   CBC: Recent Labs  Lab 05/21/20 0347 05/22/20 0406 05/23/20 0356 05/24/20 0349 05/25/20 0757 05/25/20 0810  WBC 8.1 10.6* 15.6* 11.5*  --  26.4*  HGB 7.0* 8.7* 9.2* 8.2* 8.5* 8.6*  HCT 21.0* 26.5* 28.2* 25.3* 25.0* 25.6*  MCV 88.2 90.1 91.0 91.7  --  90.1  PLT 281 320 239 213  --  200   Medications: Infusions: . sodium chloride Stopped (05/25/20 1148)  . sodium chloride    . sodium chloride 250 mL (05/25/20 0800)  . albumin human    . ceFEPime (MAXIPIME) IV 200 mL/hr at 05/25/20 1203  . feeding supplement (VITAL 1.5 CAL) 50 mL/hr at 05/25/20 1203  . fentaNYL infusion INTRAVENOUS 40 mcg/hr (05/25/20 1203)  . levETIRAcetam Stopped (05/25/20 1034)  . norepinephrine (LEVOPHED) Adult infusion 38 mcg/min (05/25/20 1203)  . vancomycin Stopped (05/24/20 1821)  . vasopressin 0.04 Units/min (05/25/20 1203)    Scheduled Medications: . chlorhexidine gluconate (MEDLINE KIT)  15 mL Mouth Rinse BID  . Chlorhexidine Gluconate Cloth  6 each Topical Daily  . docusate  100 mg Per Tube BID  . feeding supplement (PROSource TF)  45 mL Per Tube QID  . fentaNYL (SUBLIMAZE) injection  25 mcg Intravenous Once  . hydrocortisone sod succinate  (SOLU-CORTEF) inj  50 mg Intravenous Q6H  . insulin aspart  0-6 Units Subcutaneous Q4H  . mouth rinse  15 mL Mouth Rinse 10 times per day  . multivitamin  1 tablet Per Tube QHS  . nystatin  5 mL Mouth/Throat QID  . pantoprazole sodium  40 mg Per Tube Q1200  . sodium chloride flush  10-40 mL Intracatheter Q12H    have reviewed scheduled and prn medications.

## 2020-05-25 NOTE — Consult Note (Signed)
Consultation Note Date: 05/25/2020   Patient Name: Mary Wilcox  DOB: 10-28-1955  MRN: [id number]  Age / Sex: 65 y.o., female  PCP: Kristen Loader, FNP Referring Physician: Candee Furbish, MD  Reason for Consultation: Establishing goals of care  HPI/Patient Profile: 65 y.o. female  with past medical history of ESRD on HD, PVD, cervical cancer, chronic hypotension on midodrine who was admitted on 05/19/2020 with altered mental status and seizures.  She was intubated for airway protection and diagnosed with PRES based on MRI findings.  She was switched from IHD to CRRT in order to gain tighter control over her blood pressure.  Repeat MRI on 3/9 showed worsening PRES.  Early this morning (3/12) the patient's blood pressure dropped precipitously despite receiving bolus fluid, albumin and being on levophed.    I have attempted to reach the face sheet contact [name] multiple times,  I have called [place] multiple times attempting to reach the patient's RN for additional family contacts.  I called the [employer] (who is reportedly in today) and left an urgent message explaining that I was searching for family contacts.   I have placed a TOC consult asking for help in locating family urgently.  Clinical Assessment and Goals of Care:  I have reviewed medical records including EPIC notes, labs and imaging, communicated with Dr. Tamala Julian via secure chat, examined the patient and discussed her condition with the RN.   Primary Decision Maker:  NEXT OF KIN Presumably [name].    SUMMARY OF RECOMMENDATIONS    Continue current treatment.   I will continue to attempt to reach family.  TOC may consider asking the Sheriff to help [place] with a "well check" on the family.  Code Status/Advance Care Planning:  Full code.  Prognosis:  Patient is critically ill.  Medical team is working diligently  and rapidly to improve her blood pressure but there is a significant likelihood that the patient may die.    Discharge Planning: To Be Determined      Primary Diagnoses: Present on Admission: **None**   I have reviewed the medical record, interviewed the patient and family, and examined the patient. The following aspects are pertinent.  Past Medical History:  Diagnosis Date  . Bilateral hydronephrosis 2006   due to obstruction from cervical cancer  . Cataract    right eye  . Cervical cancer ([place]) 2006   IIIB s/p ext radiation and intracavitary cessium  . Chronic kidney disease    hx several episodes of ARF secondary to obstructive uropathy  . Depression   . ESRD on hemodialysis ([place])    [facility], TTS HD  . Hypothyroidism   . Insomnia    takes Ambien 3x wk  . Iron deficiency anemia   . Peripheral vascular disease ([place])   . Secondary hyperparathyroidism (of renal origin)   . Transfusion history    02/2011 for Hgb 6.8  . Wears glasses    Social History   Socioeconomic History  .  Marital status: Divorced    Spouse name: Not on file  . Number of children: Not on file  . Years of education: Not on file  . Highest education level: Not on file  Occupational History  . Not on file  Tobacco Use  . Smoking status: Never Smoker  . Smokeless tobacco: Never Used  Vaping Use  . Vaping Use: Never used  Substance and Sexual Activity  . Alcohol use: Yes    Comment: rare glass of wine  . Drug use: No  . Sexual activity: Never    Birth control/protection: Surgical, Post-menopausal  Other Topics Concern  . Not on file  Social History Narrative  . Not on file   Social Determinants of Health   Financial Resource Strain: Not on file  Food Insecurity: Not on file  Transportation Needs: Not on file  Physical Activity: Not on file  Stress: Not on file  Social Connections: Not on file   Family History  Problem Relation Age of Onset  . Lung cancer Father    . Cancer Father   . Cancer Mother        kidney and thyroid  . Heart disease Brother   . Diabetes Brother   . Hyperlipidemia Brother   . Hypertension Brother   . Other Brother        varicose vein    Allergies  Allergen Reactions  . Prednisone     Other reaction(s): severe muscle weakness in legs  . Prozac [Fluoxetine Hcl] Hives    Patient intubated, not responsive to me  Vital Signs: BP (!) 68/57   Pulse 75   Temp (!) 97.2 F (36.2 C)   Resp (!) 47   Ht 4\' 11"  (1.499 m)   Wt 75.1 kg   SpO2 100%   BMI 33.44 kg/m  Pain Scale: CPOT       SpO2: SpO2: 100 % O2 Device:SpO2: 100 % O2 Flow Rate: .     Palliative Assessment/Data: 10%    Time In: 9:30 Time Out: 10:20 Time Total: 50 min. Visit consisted of counseling and education dealing with the complex and emotionally intense issues surrounding the need for palliative care and symptom management in the setting of serious and potentially life-threatening illness. Greater than 50%  of this time was spent counseling and coordinating care related to the above assessment and plan.  Signed by: Florentina Jenny, PA-C Palliative Medicine  Please contact Palliative Medicine Team phone at [phone] for questions and concerns.  For individual provider: See Shea Evans

## 2020-05-25 NOTE — Progress Notes (Signed)
  Echocardiogram 2D Echocardiogram has been performed.  Mary Wilcox 05/25/2020, 6:04 PM

## 2020-05-25 NOTE — Procedures (Signed)
Arterial Catheter Insertion Procedure Note  Mary Wilcox  [id number]  Feb 21, 1956  Date:05/25/20  Time:8:36 AM    Provider Performing: Candee Furbish    Procedure: Insertion of Arterial Line [phone]) with US guidance ([id number])   Indication(s) Blood pressure monitoring and/or need for frequent ABGs  Consent Unable to obtain consent due to emergent nature of procedure.  Anesthesia None   Time Out Verified patient identification, verified procedure, site/side was marked, verified correct patient position, special equipment/implants available, medications/allergies/relevant history reviewed, required imaging and test results available.   Sterile Technique Maximal sterile technique including full sterile barrier drape, hand hygiene, sterile gown, sterile gloves, mask, hair covering, sterile ultrasound probe cover (if used).   Procedure Description Area of catheter insertion was cleaned with chlorhexidine and draped in sterile fashion. With real-time ultrasound guidance an arterial catheter was placed into the right radial artery.  Appropriate arterial tracings confirmed on monitor.     Complications/Tolerance None; patient tolerated the procedure well.   EBL Minimal   Specimen(s) None

## 2020-05-25 NOTE — Progress Notes (Signed)
Pharmacy Antibiotic Note  Mary Wilcox is a 65 y.o. female admitted on 05/19/2020 with AMS.  S/p acyclovir for rule out encephalitis.  Now with Staph PNA and Pharmacy has been consulted for vancomycin. Adding cefepime for c/o septic shock.   Patient has ESRD on TTS HD, but was transitioned to CRRT. Continues on CRRT. Pressor requirements increasing rapidly this AM. Hypothermic on CRRT but stable. WBC up to 26.4.  Plan: Continue vancomycin 750 mg IV q24 hrs Start Cefepime 2 gm IV q12 hrs Monitor CRRT interruption/tolerance, cultures/sensitivities, clinical progression Vancomycin level at steady state  Height: 4\' 11"  (149.9 cm) Weight: 75.1 kg (165 lb 9.1 oz) IBW/kg (Calculated) : 43.2  Temp (24hrs), Avg:96.7 F (35.9 C), Min:96 F (35.6 C), Max:97.2 F (36.2 C)  Recent Labs  Lab 05/21/20 0347 05/22/20 0406 05/22/20 1711 05/23/20 0356 05/23/20 1531 05/24/20 0349 05/24/20 1726 05/25/20 0544 05/25/20 0810  WBC 8.1 10.6*  --  15.6*  --  11.5*  --   --  26.4*  CREATININE 4.69* 3.37*   < > 2.08* 1.68* 1.59* 1.34* 1.12*  --   LATICACIDVEN  --   --   --   --   --   --   --   --  3.9*   < > = values in this interval not displayed.    Estimated Creatinine Clearance: 43.7 mL/min (A) (by C-G formula based on SCr of 1.12 mg/dL (H)).    Allergies  Allergen Reactions  . Prednisone     Other reaction(s): severe muscle weakness in legs  . Prozac [Fluoxetine Hcl] Hives    3/6 MRSA PCR - negative 3/8 TA - MSSA 3/12 BCx: 3/12 TA:  Vancomycin 3/10>> Cefepime 3/12>>  Richardine Service, PharmD, BCPS PGY2 Cardiology Pharmacy Resident Phone: [phone] 05/25/2020  11:44 AM  Please check [email or web address] for unit-specific pharmacy phone numbers.

## 2020-05-25 NOTE — Progress Notes (Addendum)
Date and time results received: 05/25/20 0935  Test: lactic acid Critical Value: 3.9  Name of Provider Notified: Erskine Emery, MD  Orders Received? Or Actions Taken?: physician notified

## 2020-05-25 NOTE — Progress Notes (Signed)
Labs with lactic acidosis, shock liver, WBC up. Could be SE, LTVEEG to be hooked back up. Vasopressors, stress steroids, albumin started. Palliative to speak with family again, appreciate help.  Erskine Emery MD PCCM

## 2020-05-25 NOTE — Progress Notes (Signed)
EEG complete - results pending 

## 2020-05-25 NOTE — Progress Notes (Signed)
CSW notified by palliative NP that patient's family hasn't been able to be contacted. CSW attempted to reach the daughter, [name], but was unsuccessful. Left a voicemail for [employer] and for [employer] emergency contact, Dominica Severin. Reached out to the patient's HD Center, spoke with reception who confirmed the daughter's phone number and said that she usually answers, they have been able to reach her before. Attempted to contact [facility] Corning to ask about any other numbers on file, but no one answered. CSW attempted a people search online to find other numbers for Heather, but all possible numbers were disconnected. CSW contacted [facility] Office to ask for someone to go to Heather's home for a wellness check, to ask her to call the hospital. Someone from the [place] office will call CSW back after visiting the daughter's home.  Laveda Abbe, [place] Clinical Social Worker [phone]

## 2020-05-25 NOTE — Progress Notes (Signed)
NAME:  Mary Wilcox, MRN:  [id number], DOB:  1955-08-02, LOS: 7 ADMISSION DATE:  06/11/2020, CONSULTATION DATE:  05/25/2020  REFERRING MD:  Shelly Coss, ED PA  CHIEF COMPLAINT: Altered mental status, seizure  Brief History:  65 year old with ESRD on dialysis, NHR presented with altered mental status and hypertension, had a seizure in the ED requiring intubation for airway protection  Past Medical History:  ESRD on HD T/TH/S PAD Hypothyroidism Cervical cancer 2006 status post radiation  Significant Hospital Events:  3/5 5 admitted with mental status change.  Had witnessed seizure while getting EEG requiring intubation during propofol load and Keppra load 3/6:MRI, pleated showing bilateral posterior cerebral cortical swelling and restricted diffusion fever and complicated posterior reversible encephalopathy syndrome or seizure phenomenon.  Got hypotensive during dialysis required left subclavian triple-lumen catheter placement.  Started on norepinephrine.  Got 1 unit blood. Stopped [place] heparin 3/7: EEG negative for seizure. GCS 3.  Weaning sedating medications.  Getting dialysis.  Weaning norepinephrine still hypotensive. core track for nutrition. got extra treatment of dialysis 3/8: EEG still negative for seizure but showing diffuse slowing.  Grimaces and opens eyes to noxious stimulus but still not following commands.  Failed pressure support attempts.  Sputum sent. Had to be started on Cleviprex as SBP > 160. Stopped acyclovir as HSV was neg. hgb drifted down gm no source of bleeding. Got another unit blood.  3/9 repeat MRI showing worsening PRES. BP better controlled on Cleviprex. Started labetolol VT in effort to wean celeviprex. Neuro marginally better. Actually opened eyes to voice.  3/10 BP has been better since starting Cleviprex and as needed labetalol.  Neuro exam somewhat unchanged from yesterday  Consults:  Renal Neurology  Procedures:  ETT 3/5 >> Lt [place] CVL 3/6 >>  Significant  Diagnostic Tests:  CT angio head/ neck >> stented left subclavian and axillary veins with stent occlusion MRI brain 3/5 >>Bilateral posterior cerebral cortical swelling and restricted diffusion, favor complicated PRES vs seizure LTM EEG 3/6 >> moderate diffuse encephalopathy CSF 3/5 >> normal   Micro Data:  CSF 3/5 >> ng  Antimicrobials:  Acyclovir (stopped 3/8) 3/10 Vancomycin >  Interim History / Subjective:  More hypotensive and tachypneic this AM with escalating pressor needs.  Objective   Blood pressure (!) 68/57, pulse 75, temperature (!) 97.2 F (36.2 C), resp. rate (!) 47, height 4\' 11"  (1.499 m), weight 75.1 kg, SpO2 100 %. CVP:  [1 mmHg] 1 mmHg  Vent Mode: PRVC FiO2 (%):  [30 %-100 %] 40 % Set Rate:  [20 bmp] 20 bmp Vt Set:  [350 mL] 350 mL PEEP:  [5 cmH20] 5 cmH20 Pressure Support:  [10 cmH20] 10 cmH20 Plateau Pressure:  [15 cmH20] 15 cmH20   Intake/Output Summary (Last 24 hours) at 05/25/2020 7628 Last data filed at 05/25/2020 0700 Gross per 24 hour  Intake 1743.21 ml  Output 2602 ml  Net -858.79 ml   Filed Weights   05/23/20 0500 05/24/20 0349 05/25/20 0500  Weight: 74.6 kg 75.3 kg 75.1 kg    Examination: Constitutional: tachypneic ill appearing woman  Eyes: R gaze preference, +doll's Ears, nose, mouth, and throat: ETT in place, minimal secretions Cardiovascular: weak pulses, bedside echo with LVH and underfilled ventricles Respiratory: very tachypneic with accessory muscle use Gastrointestinal: soft, +BS Skin: pale, scattered bruising Neurologic: winces to pain, occasional twitching uppers, nothing on lowers, similar to yesterday Psychiatric: cannot assess  Chemistries okay on CRRT, calcium pretty high both ionized and non-ionized I-stat H/H and ABG  numbers look okay Formal CBC pending  Resolved Hospital Problem list   Hyperkalemia, resolved with urgent dialysis on 3/5 Hypotensive  #Acute hyponatremia in setting of seizure-resolved  Assessment  & Plan:   #Acute metabolic encephalopathy with seizure in the setting of PRES #Hypertensive emergency on admission  Remains stuporous, grimaces to painful stimuli, very very slow improvement - BP control SBP < 140 (not an issue at present) - Needs trach/PEG/LTACH depending on family [place], daughter out of town at present  #New 3/12, worsening shock- Bedside echo argues for hypovolemic vs. Septic; inappropriate chronotropy contributing as well.  CXR benign. - Stop labetalol - Check lactate, CBC - Sputum, blood cultures - Hold on abx for now - Albumin, IVF given - Levophed titrated to MAP 65  #Acute respiratory failure in the setting of ineffective airway protection with seizure - Will need trach depending on GOC  # ESRD- continue CVVH, even to positive today, appreciate nephro help  Best practice (evaluated daily)  Diet: TF Pain/Anxiety/Delirium protocol (if indicated): on hold with hypotension, would prefer PRNs if able, RASS goal 0 VAP protocol (if indicated): Y DVT prophylaxis: SQ heparin GI prophylaxis: protonix Glucose control: SSI Mobility: Bed rest Disposition:ICU  Goals of Care:  Last date of multidisciplinary goals of care discussion: Daughter has been tough to reach, told her today (3/12) about mother's decompensation, continue full scope.  Would be nice to get her in here to discuss [place] and if [place] would have wanted trach/PEG.  Palliative consulted to help facilitate meeting.  Likely nothing before Monday.   Patient critically ill due to shock, resp failure Interventions to address this today vent titration, pressor titration Risk of deterioration without these interventions is high  I personally spent 38 minutes providing critical care not including any separately billable procedures  Erskine Emery MD  Pulmonary Critical Care  Prefer epic messenger for cross cover needs If after hours, please call E-link

## 2020-05-26 ENCOUNTER — Other Ambulatory Visit: Payer: Self-pay

## 2020-05-26 ENCOUNTER — Encounter (HOSPITAL_COMMUNITY): Payer: Self-pay | Admitting: Pulmonary Disease

## 2020-05-26 DIAGNOSIS — N186 End stage renal disease: Secondary | ICD-10-CM | POA: Diagnosis not present

## 2020-05-26 DIAGNOSIS — J9601 Acute respiratory failure with hypoxia: Secondary | ICD-10-CM | POA: Diagnosis not present

## 2020-05-26 DIAGNOSIS — I6783 Posterior reversible encephalopathy syndrome: Secondary | ICD-10-CM | POA: Diagnosis not present

## 2020-05-26 DIAGNOSIS — R579 Shock, unspecified: Secondary | ICD-10-CM | POA: Diagnosis not present

## 2020-05-26 LAB — HEPATIC FUNCTION PANEL
ALT: 2116 U/L — ABNORMAL HIGH (ref 0–44)
AST: 2008 U/L — ABNORMAL HIGH (ref 15–41)
Albumin: 3.6 g/dL (ref 3.5–5.0)
Alkaline Phosphatase: 81 U/L (ref 38–126)
Bilirubin, Direct: 0.3 mg/dL — ABNORMAL HIGH (ref 0.0–0.2)
Indirect Bilirubin: 0.7 mg/dL (ref 0.3–0.9)
Total Bilirubin: 1 mg/dL (ref 0.3–1.2)
Total Protein: 6.3 g/dL — ABNORMAL LOW (ref 6.5–8.1)

## 2020-05-26 LAB — RENAL FUNCTION PANEL
Albumin: 3 g/dL — ABNORMAL LOW (ref 3.5–5.0)
Anion gap: 14 (ref 5–15)
BUN: 58 mg/dL — ABNORMAL HIGH (ref 8–23)
CO2: 17 mmol/L — ABNORMAL LOW (ref 22–32)
Calcium: 11.9 mg/dL — ABNORMAL HIGH (ref 8.9–10.3)
Chloride: 109 mmol/L (ref 98–111)
Creatinine, Ser: 2.83 mg/dL — ABNORMAL HIGH (ref 0.44–1.00)
GFR, Estimated: 18 mL/min — ABNORMAL LOW (ref >60)
Glucose, Bld: 201 mg/dL — ABNORMAL HIGH (ref 70–99)
Phosphorus: 4.5 mg/dL (ref 2.5–4.6)
Potassium: 4.4 mmol/L (ref 3.5–5.1)
Sodium: 140 mmol/L (ref 135–145)

## 2020-05-26 LAB — PROTIME-INR
INR: 1.9 — ABNORMAL HIGH (ref 0.8–1.2)
Prothrombin Time: 21.1 seconds — ABNORMAL HIGH (ref 11.4–15.2)

## 2020-05-26 LAB — BASIC METABOLIC PANEL
Anion gap: 13 (ref 5–15)
BUN: 60 mg/dL — ABNORMAL HIGH (ref 8–23)
CO2: 16 mmol/L — ABNORMAL LOW (ref 22–32)
Calcium: 13.3 mg/dL (ref 8.9–10.3)
Chloride: 107 mmol/L (ref 98–111)
Creatinine, Ser: 3 mg/dL — ABNORMAL HIGH (ref 0.44–1.00)
GFR, Estimated: 17 mL/min — ABNORMAL LOW (ref >60)
Glucose, Bld: 199 mg/dL — ABNORMAL HIGH (ref 70–99)
Potassium: 5.4 mmol/L — ABNORMAL HIGH (ref 3.5–5.1)
Sodium: 136 mmol/L (ref 135–145)

## 2020-05-26 LAB — CBC
HCT: 19.8 % — ABNORMAL LOW (ref 36.0–46.0)
Hemoglobin: 6.6 g/dL — CL (ref 12.0–15.0)
MCH: 30.3 pg (ref 26.0–34.0)
MCHC: 33.3 g/dL (ref 30.0–36.0)
MCV: 90.8 fL (ref 80.0–100.0)
Platelets: 187 10*3/uL (ref 150–400)
RBC: 2.18 MIL/uL — ABNORMAL LOW (ref 3.87–5.11)
RDW: 17.5 % — ABNORMAL HIGH (ref 11.5–15.5)
WBC: 41.9 10*3/uL — ABNORMAL HIGH (ref 4.0–10.5)
nRBC: 4.3 % — ABNORMAL HIGH (ref 0.0–0.2)

## 2020-05-26 LAB — GLUCOSE, CAPILLARY
Glucose-Capillary: 113 mg/dL — ABNORMAL HIGH (ref 70–99)
Glucose-Capillary: 148 mg/dL — ABNORMAL HIGH (ref 70–99)
Glucose-Capillary: 156 mg/dL — ABNORMAL HIGH (ref 70–99)
Glucose-Capillary: 174 mg/dL — ABNORMAL HIGH (ref 70–99)
Glucose-Capillary: 191 mg/dL — ABNORMAL HIGH (ref 70–99)
Glucose-Capillary: 71 mg/dL (ref 70–99)

## 2020-05-26 LAB — MAGNESIUM: Magnesium: 2.9 mg/dL — ABNORMAL HIGH (ref 1.7–2.4)

## 2020-05-26 LAB — CALCIUM
Calcium: 13.5 mg/dL (ref 8.9–10.3)
Calcium: 11.6 mg/dL — ABNORMAL HIGH (ref 8.9–10.3)
Calcium: 10.9 mg/dL — ABNORMAL HIGH (ref 8.9–10.3)

## 2020-05-26 LAB — PREPARE RBC (CROSSMATCH)

## 2020-05-26 LAB — HEMOGLOBIN AND HEMATOCRIT, BLOOD
HCT: 18.6 % — ABNORMAL LOW (ref 36.0–46.0)
HCT: 28.4 % — ABNORMAL LOW (ref 36.0–46.0)
Hemoglobin: 6.2 g/dL — CL (ref 12.0–15.0)
Hemoglobin: 9.8 g/dL — ABNORMAL LOW (ref 12.0–15.0)

## 2020-05-26 LAB — PHOSPHORUS: Phosphorus: 5.4 mg/dL — ABNORMAL HIGH (ref 2.5–4.6)

## 2020-05-26 LAB — LACTIC ACID, PLASMA
Lactic Acid, Venous: 2.4 mmol/L (ref 0.5–1.9)
Lactic Acid, Venous: 1.5 mmol/L (ref 0.5–1.9)

## 2020-05-26 LAB — TRIGLYCERIDES: Triglycerides: 127 mg/dL (ref <150)

## 2020-05-26 LAB — PROCALCITONIN: Procalcitonin: 100.17 ng/mL

## 2020-05-26 MED ORDER — SODIUM CHLORIDE 0.9 % IV SOLN
600.0000 [IU]/h | INTRAVENOUS | Status: DC
  Administered 2020-05-26 – 2020-05-29 (×6): 600 [IU]/h via INTRAVENOUS_CENTRAL
  Filled 2020-05-26 (×6): qty 2

## 2020-05-26 MED ORDER — PRISMASOL BGK 4/2.5 32-4-2.5 MEQ/L REPLACEMENT SOLN: Status: DC

## 2020-05-26 MED ORDER — ALTEPLASE 2 MG IJ SOLR: 2.0000 mg | Freq: Once | INTRAMUSCULAR | Status: DC | PRN

## 2020-05-26 MED ORDER — HEPARIN BOLUS VIA INFUSION (CRRT)
1000.0000 [IU] | INTRAVENOUS | Status: DC | PRN
  Filled 2020-05-26: qty 1000

## 2020-05-26 MED ORDER — SODIUM CHLORIDE 0.9% IV SOLUTION: Freq: Once | INTRAVENOUS | Status: AC

## 2020-05-26 MED ORDER — SODIUM CHLORIDE 0.9 % IV SOLN
30.0000 mg | Freq: Once | INTRAVENOUS | Status: AC
  Administered 2020-05-26: 30 mg via INTRAVENOUS
  Filled 2020-05-26: qty 10

## 2020-05-26 MED ORDER — SODIUM ZIRCONIUM CYCLOSILICATE 10 G PO PACK
10.0000 g | PACK | Freq: Once | ORAL | Status: AC
  Administered 2020-05-26: 10 g
  Filled 2020-05-26: qty 1

## 2020-05-26 MED ORDER — PRISMASOL B22GK 4/0 22-4 MEQ/L EC SOLN
Status: DC
  Filled 2020-05-26 (×40): qty 5000

## 2020-05-26 MED ORDER — SODIUM CHLORIDE 0.9 % FOR CRRT: INTRAVENOUS_CENTRAL | Status: DC | PRN

## 2020-05-26 MED ORDER — SODIUM CHLORIDE 0.9 % IV SOLN: 2.0000 g | Freq: Two times a day (BID) | INTRAVENOUS | Status: DC

## 2020-05-26 MED ORDER — STERILE WATER FOR INJECTION IV SOLN
INTRAVENOUS | Status: DC
  Filled 2020-05-26 (×8): qty 150

## 2020-05-26 MED ORDER — HEPARIN SODIUM (PORCINE) 1000 UNIT/ML DIALYSIS
1000.0000 [IU] | INTRAMUSCULAR | Status: DC | PRN
  Administered 2020-05-30: 3200 [IU] via INTRAVENOUS_CENTRAL
  Filled 2020-05-26: qty 6
  Filled 2020-05-26: qty 1
  Filled 2020-05-26 (×2): qty 6
  Filled 2020-05-26: qty 2
  Filled 2020-05-26: qty 1

## 2020-05-26 MED ORDER — VANCOMYCIN HCL 750 MG/150ML IV SOLN
750.0000 mg | INTRAVENOUS | Status: DC
  Filled 2020-05-26: qty 150

## 2020-05-26 MED ORDER — PRISMASOL BGK 4/2.5 32-4-2.5 MEQ/L EC SOLN: Status: DC

## 2020-05-26 MED ORDER — LEVETIRACETAM IN NACL 500 MG/100ML IV SOLN
500.0000 mg | INTRAVENOUS | Status: DC
  Administered 2020-05-26: 500 mg via INTRAVENOUS
  Filled 2020-05-26: qty 100

## 2020-05-26 NOTE — Progress Notes (Signed)
EEG maintenance  done, skin check, no breakdown seen.

## 2020-05-26 NOTE — Progress Notes (Addendum)
NAME:  Mary Wilcox, MRN:  [id number], DOB:  January 10, 1956, LOS: 8 ADMISSION DATE:  05/17/2020, CONSULTATION DATE:  05/26/2020  REFERRING MD:  Shelly Coss, ED PA  CHIEF COMPLAINT: Altered mental status, seizure  Brief History:  65 year old with ESRD on dialysis, NHR presented with altered mental status and hypertension, had a seizure in the ED requiring intubation for airway protection  Past Medical History:  ESRD on HD T/TH/S PAD Hypothyroidism Cervical cancer 2006 status post radiation  Significant Hospital Events:  3/5 5 admitted with mental status change.  Had witnessed seizure while getting EEG requiring intubation during propofol load and Keppra load 3/6:MRI, pleated showing bilateral posterior cerebral cortical swelling and restricted diffusion fever and complicated posterior reversible encephalopathy syndrome or seizure phenomenon.  Got hypotensive during dialysis required left subclavian triple-lumen catheter placement.  Started on norepinephrine.  Got 1 unit blood. Stopped [place] heparin 3/7: EEG negative for seizure. GCS 3.  Weaning sedating medications.  Getting dialysis.  Weaning norepinephrine still hypotensive. core track for nutrition. got extra treatment of dialysis 3/8: EEG still negative for seizure but showing diffuse slowing.  Grimaces and opens eyes to noxious stimulus but still not following commands.  Failed pressure support attempts.  Sputum sent. Had to be started on Cleviprex as SBP > 160. Stopped acyclovir as HSV was neg. hgb drifted down gm no source of bleeding. Got another unit blood.  3/9 repeat MRI showing worsening PRES. BP better controlled on Cleviprex. Started labetolol VT in effort to wean celeviprex. Neuro marginally better. Actually opened eyes to voice.  3/10 BP has been better since starting Cleviprex and as needed labetalol.  Neuro exam somewhat unchanged from yesterday  Consults:  Renal Neurology  Procedures:  ETT 3/5 >> Lt [place] CVL 3/6 >>  Significant  Diagnostic Tests:  CT angio head/ neck >> stented left subclavian and axillary veins with stent occlusion MRI brain 3/5 >>Bilateral posterior cerebral cortical swelling and restricted diffusion, favor complicated PRES vs seizure LTM EEG 3/6 >> moderate diffuse encephalopathy CSF 3/5 >> normal   Micro Data:  CSF 3/5 >> ng  Antimicrobials:  Acyclovir (stopped 3/8) 3/10 Vancomycin >  Interim History / Subjective:  Settled out this AM. Labs worse but may just be catching up. Neuro status improved but still poor.  Objective   Blood pressure (!) 150/65, pulse 85, temperature 99 F (37.2 C), temperature source Oral, resp. rate 20, height 4\' 11"  (1.499 m), weight 74.6 kg, SpO2 99 %. CVP:  [7 mmHg-22 mmHg] 8 mmHg  Vent Mode: PCV FiO2 (%):  [30 %-40 %] 30 % Set Rate:  [20 bmp] 20 bmp Vt Set:  [350 mL] 350 mL PEEP:  [5 cmH20] 5 cmH20 Plateau Pressure:  [18 cmH20] 18 cmH20   Intake/Output Summary (Last 24 hours) at 05/26/2020 0738 Last data filed at 05/26/2020 0700 Gross per 24 hour  Intake 3606.02 ml  Output 2200 ml  Net 1406.02 ml   Filed Weights   05/24/20 0349 05/25/20 0500 05/26/20 0433  Weight: 75.3 kg 75.1 kg 74.6 kg    Examination: Constitutional: ill appearing woman in less distress than yesterday  Eyes: right gaze preference, no obvious seizure activity with this on EEG which is hooked up Ears, nose, mouth, and throat: ETT in place with minimal secretions Cardiovascular: RRR, ext lukewarm Respiratory: mechanical breath sounds, passive on vent Gastrointestinal: soft, +BS, rectal tube in place Skin: No rashes, normal turgor Neurologic: she opens eyes to voice, does not track, strong cough/gag, withdraw to pain  but very delayed Psychiatric: RASS 0  H/H down, WBC up Pct markedly elevated K a bit up  Resolved Hospital Problem list   Hyperkalemia, resolved with urgent dialysis on 3/5 Hypotensive  #Acute hyponatremia in setting of seizure-resolved  Assessment &  Plan:   #Acute metabolic encephalopathy with seizure in the setting of PRES #Hypertensive emergency on admission  Remains stuporous, grimaces to painful stimuli, very very slow improvement - BP control SBP < 140 (not an issue at present) - f/u formal EEG read - AEDs per neuro, appreciate help - Needs trach/PEG/LTACH vs. Comfort, ongoing discussions with surrogate Heather.  Tough situation with a lot of estranged family.  #3/12 profound septic shock- improved pressor requirements today, labs are worse (shock liver, WBC count) but suspect these are just catching up with how sick she was yesterday plus demargination effect of steroids. Formal limited echo confirms underfilling - Continue broad spectrum abx; could consider zosyn instead of cefepime given encephalopathy but will defer to neuro - f/u culture data - continue stress steroids through today  #Acute respiratory failure in the setting of ineffective airway protection with seizure - Continue PC with above goal LTVV (not in ARDS and greatly reduces WOB in this mode)  - Will need trach depending on GOC  # ESRD- likely needs to resume CVVH, would keep even to positive  # Anemia- no s/s of blood loss, suspect related to bone marrow stunning, volume expansion with crystalloid and albumin as well as losses from CRRT filter changes. - 2 units blood, monitor H/H  Best practice (evaluated daily)  Diet: TF Pain/Anxiety/Delirium protocol (if indicated): holding fentanyl this AM, needed it for WOB yesterday VAP protocol (if indicated): Y DVT prophylaxis: SQ heparin GI prophylaxis: protonix Glucose control: SSI Mobility: Bed rest Disposition:ICU  Goals of Care:  Last date of multidisciplinary goals of care discussion: Palliative able to reach [facility] on 3/12 and [place] is now DNR.  Full GOC yet to be determined.  She will need trach/PEG/LTACH w/ iHD if all aggressive care is to be pursued   Patient critically ill due to shock, resp  failure Interventions to address this today vent titration, pressor titration Risk of deterioration without these interventions is high  I personally spent 34 minutes providing critical care not including any separately billable procedures  Erskine Emery MD [place] [facility] Critical [facility]  Prefer epic messenger for cross cover needs If after hours, please call E-link

## 2020-05-26 NOTE — Progress Notes (Signed)
Pharmacy Antibiotic Note  Mary Wilcox is a 65 y.o. female admitted on 05/25/2020 with AMS.  S/p acyclovir for rule out encephalitis.  Now with Staph PNA and Pharmacy has been consulted for vancomycin. Adding cefepime for c/o septic shock.   Patient has ESRD on TTS HD, but was transitioned to CRRT. CRRT was held 3/13 at 0930 and restarted this afternoon at 1248. Remains anuric. Will re-time antibiotics to account for CRRT holiday.   Plan: Restart vancomycin 750 mg IV q24 hrs on 3/14 at 1200 Restart Cefepime 2 gm IV q12 hrs on 3/14 at 2200 Monitor CRRT interruption/tolerance, cultures/sensitivities, clinical progression Vancomycin level at steady state  Height: 4\' 11"  (149.9 cm) Weight: 74.6 kg (164 lb 7.4 oz) IBW/kg (Calculated) : 43.2  Temp (24hrs), Avg:99.1 F (37.3 C), Min:98.7 F (37.1 C), Max:100.3 F (37.9 C)  Recent Labs  Lab 05/22/20 0406 05/22/20 1711 05/23/20 0356 05/23/20 1531 05/24/20 0349 05/24/20 1726 05/25/20 0544 05/25/20 0810 05/25/20 1047 05/26/20 0407 05/26/20 0825 05/26/20 1230  WBC 10.6*  --  15.6*  --  11.5*  --   --  26.4*  --  41.9*  --   --   CREATININE 3.37*   < > 2.08* 1.68* 1.59* 1.34* 1.12*  --   --  3.00*  --   --   LATICACIDVEN  --   --   --   --   --   --   --  3.9* 2.9*  --  2.4* 1.5   < > = values in this interval not displayed.    Estimated Creatinine Clearance: 16.2 mL/min (A) (by C-G formula based on SCr of 3 mg/dL (H)).    Allergies  Allergen Reactions  . Prednisone     Other reaction(s): severe muscle weakness in legs  . Prozac [Fluoxetine Hcl] Hives    3/6 MRSA PCR - negative 3/8 TA - MSSA 3/12 BCx: 3/12 TA: mod GPC in pairs, abundant GNR  Vancomycin 3/10>> Cefepime 3/12>>  Richardine Service, PharmD, BCPS PGY2 Cardiology Pharmacy Resident Phone: [phone] 05/26/2020  1:50 PM  Please check [email or web address] for unit-specific pharmacy phone numbers.

## 2020-05-26 NOTE — Progress Notes (Signed)
Subjective: K+ up 5.5 , BP's better and pressors weaning down. WBC ^ 42K.  Ca up 13   Objective Vital signs in last 24 hours: Vitals:   05/26/20 0700 05/26/20 0800 05/26/20 0823 05/26/20 0846  BP: (!) 150/65 (!) 138/53    Pulse: 85 88 92 90  Resp: 20 20 (!) 21 (!) 21  Temp:  99.1 F (37.3 C)    TempSrc:  Axillary    SpO2: 99% 100% 99% 100%  Weight:      Height:       Weight change: -0.5 kg  Intake/Output Summary (Last 24 hours) at 05/26/2020 0904 Last data filed at 05/26/2020 0846 Gross per 24 hour  Intake 3567.95 ml  Output 2200 ml  Net 1367.95 ml  Physical Exam: General: sedated on vent Heart: RRR Lungs: CBS bilat Abdomen: obese, soft, non tender Extremities: edema resolved bilat LE's Dialysis Access: TDC      OP HD: TTS AF  3h 32mn  75.5kg  2/2 bath Hep 7400  RIJ TDC Mircera 60 MCG q 2wks last given 05/07/20  Venofer 50 weekly  Assessment/Plan 1. Shock / hypothermia - stabilizing w/ IV abx, BP's better, weaning pressors. WBC ^40K.   2. HTN crisis/ PRES by MRI/ seizures: hx of missing HD/ chronic vol overload/ poor compliance. 2nd MRI 3/9+ worsening PRES. Pt was transitioned from iHD to CRRT 3/9 to avoid the large volume shifts of regular HD. BP's better now and off cleviprex.   3. Hypercalcemia - severe up to 13 today. Will use zero Ca++ bath w/ q 4 hr Ca labs, may need pamidronate as well. Suspect due to immobilization.  4. ESRD - on HD since 2012. HD TTS.  CRRT started on 3/09. K+ up, will resume CRRT.  5. Vol overload: on admission was up 8kg >>  under dry wt now. FU CXR w/o edema. 6. Anemia of ESRD - Hgb 8's.  SP darbe 150ug on 3/08 here. Will resume next week now that BP's under control.   7. Metabolic bone disease - calcium 8.3 no vitamin D at outpatient unit, continue calcium acetate and Sensipar when taking p.o.'s 8. Nutrition -ALB 3.3, currently n.p.o. 9. [facility]  10. GOC - seen by palliative care. Given SNF dependence,  hx of refusal of care and ++sig comorbidities would favor transition to comfort care.   RKelly Splinter MD 05/26/2020, 9:04 AM       Labs: Basic Metabolic Panel: Recent Labs  Lab 05/24/20 0349 05/24/20 1726 05/25/20 0544 05/25/20 0757 05/26/20 0407  NA 136 136 137 135 136  K 4.8 4.6 5.1 5.0 5.4*  CL 101 102 101  --  107  CO2 '27 26 23  ' --  16*  GLUCOSE 153* 198* 176*  --  199*  BUN 29* 24* 22  --  60*  CREATININE 1.59* 1.34* 1.12*  --  3.00*  CALCIUM 10.7* 10.8* 11.6*  --  13.3*  PHOS 3.9 3.2 4.1  --   --    Liver Function Tests: Recent Labs  Lab 05/25/20 0544 05/25/20 0810 05/26/20 0407  AST  --  1,580* 2,008*  ALT  --  1,244* 2,116*  ALKPHOS  --  84 81  BILITOT  --  1.1 1.0  PROT  --  6.1* 6.3*  ALBUMIN 2.2* 2.4* 3.6   No results for input(s): LIPASE, AMYLASE in the last 168 hours. No results for input(s): AMMONIA in the last 168 hours. CBC: Recent Labs  Lab 05/22/20 0406 05/23/20 0356 05/24/20 0349 05/25/20 0757 05/25/20 0810 05/26/20 0407 05/26/20 0539  WBC 10.6* 15.6* 11.5*  --  26.4* 41.9*  --   HGB 8.7* 9.2* 8.2*   < > 8.6* 6.6* 6.2*  HCT 26.5* 28.2* 25.3*   < > 25.6* 19.8* 18.6*  MCV 90.1 91.0 91.7  --  90.1 90.8  --   PLT 320 239 213  --  200 187  --    < > = values in this interval not displayed.   Medications: Infusions: . sodium chloride 20 mL/hr at 05/26/20 0700  . sodium chloride    . sodium chloride 250 mL (05/25/20 0800)  . ceFEPime (MAXIPIME) IV Stopped (05/25/20 2156)  . feeding supplement (VITAL 1.5 CAL) 50 mL/hr at 05/25/20 1800  . fentaNYL infusion INTRAVENOUS 75 mcg/hr (05/26/20 0700)  . levETIRAcetam    . norepinephrine (LEVOPHED) Adult infusion 6 mcg/min (05/26/20 0700)  . vancomycin Stopped (05/25/20 1656)  . vasopressin 0.02 Units/min (05/26/20 0700)    Scheduled Medications: . sodium chloride   Intravenous Once  . chlorhexidine gluconate (MEDLINE KIT)  15 mL Mouth Rinse BID  . Chlorhexidine Gluconate Cloth  6 each  Topical Daily  . docusate  100 mg Per Tube BID  . feeding supplement (PROSource TF)  45 mL Per Tube QID  . fentaNYL (SUBLIMAZE) injection  25 mcg Intravenous Once  . hydrocortisone sod succinate (SOLU-CORTEF) inj  50 mg Intravenous Q6H  . insulin aspart  0-6 Units Subcutaneous Q4H  . mouth rinse  15 mL Mouth Rinse 10 times per day  . multivitamin  1 tablet Per Tube QHS  . nystatin  5 mL Mouth/Throat QID  . pantoprazole sodium  40 mg Per Tube Q1200  . sodium chloride flush  10-40 mL Intracatheter Q12H    have reviewed scheduled and prn medications.

## 2020-05-26 NOTE — Progress Notes (Signed)
Neurology Progress Note ID: Mary Wilcox is a 65 y.o. with history of ESRD on iHD (intermittent adherence) due to obstructive hydronephrosis from cervical cancer who was found confused on 05/22/2020, now being managed for PRES.   Initially consulted for: New onset seizures in the setting of acute encephalopathy w/ hypertensive urgency/emergency, hyponatremia, missed dialysis with uremia  Major workup / events: 3/5 LP: RBC 26, WBC 2, Protein 60, Glucose 95 (serum 234)  Gram stain negative  HSV negative 3/5 EEG with L frontal seizure 4.5 minutes long 3/6 - 3/9 LTM with weaning sedation negative for further seizures, intermittent myoclonic movements with bilateral upper extremity extension (typically w/ coughing) are not epileptic  3/6 MRI brain without contrast concerning for PRES 3/9 MRI brain without contrast with worsening diffusion restriction in the bilateral occipital lobes concerning for worsening PRES  3/11 -- best exam, following commands to wiggle R toes to command 3/12 -- decline, c/f septic shock, started cefepime, resumed LTM  Major interval events/Subjective: - started on cefepime - receiving blood for Hgb in the 6s  - shock liver  - rising BUN - remains minimally interactive though somewhat improved - CRRT paused due to hypotension  Exam:  Vitals:   05/26/20 0700 05/26/20 0800  BP: (!) 150/65 (!) 138/53  Pulse: 85 88  Resp: 20 20  Temp:  99.1 F (37.3 C)  SpO2: 99% 100%   Gen: In bed, NAD,  Resp: More comfortable on the ventilator today Abd: soft, nt Skin: Warm and dry Extremities: Edema slightly worsened  Neuro: Mental status: Eyes open spontaneous.  Does not follow any commands today but does keep eyes open Cranial Nerves: II:She did not blink to threat- pupils are equal, round, and reactive to light, 3 mm to 2 mm.  III,IV, FT:DDUK are again with right gaze preference, VOR intact  V:VII:Corneals are intact to light eyelid touch bilateral VIII:  Does not orient to voice, at one point appeared to smile slightly to my voice XI/XII: Intact cough, gag intact per nursing Motor/sensory: Grimace to noxious stimulation in all 4 extremities, with trace movement only of the bilateral upper extremities Babinski: Today mute bilaterally (previously downgoing on the right) Cerebellar: Unable to assess secondary to patient's mental statusand weakness    Pertinent Labs: CBC Latest Ref Rng & Units 05/26/2020 05/26/2020 05/25/2020  WBC 4.0 - 10.5 K/uL - 41.9(H) 26.4(H)  Hemoglobin 12.0 - 15.0 g/dL 6.2(LL) 6.6(LL) 8.6(L)  Hematocrit 36.0 - 46.0 % 18.6(L) 19.8(L) 25.6(L)  Platelets 150 - 400 K/uL - 187 200   CMP Latest Ref Rng & Units 05/26/2020 05/25/2020 05/25/2020  Glucose 70 - 99 mg/dL 199(H) - -  BUN 8 - 23 mg/dL 60(H) - -  Creatinine 0.44 - 1.00 mg/dL 3.00(H) - -  Sodium 135 - 145 mmol/L 136 - 135  Potassium 3.5 - 5.1 mmol/L 5.4(H) - 5.0  Chloride 98 - 111 mmol/L 107 - -  CO2 22 - 32 mmol/L 16(L) - -  Calcium 8.9 - 10.3 mg/dL 13.3(HH) - -  Total Protein 6.5 - 8.1 g/dL 6.3(L) 6.1(L) -  Total Bilirubin 0.3 - 1.2 mg/dL 1.0 1.1 -  Alkaline Phos 38 - 126 U/L 81 84 -  AST 15 - 41 U/L 2,008(H) 1,580(H) -  ALT 0 - 44 U/L 2,116(H) 1,244(H) -   EEG notable for generalized periodic discharges with triphasic morphology (aka GPDs) which are on the ictal/interictal ictal continuum.  Impression:  Slowly recovering from PRES w/ BP control and CVVHD, now complicated  by recurrent hypotension, likely secondary to septic shock.  Given her GPDs, I think continuing LTM for today will be useful especially given cefepime can lower the seizure threshold.  Notably intermittent right gaze does not appear to be seizure activity based on my examination today.  Recommendations: -Strict systolicBP <709KHVF, MAP 47-34; - Appreciate CCM team management of septic shock and ventilator, okay to continue cefepime at this time but appreciate narrowing as appropriate and will  consider requesting switching to Zosyn if LTM demonstrates worsening or concern for seizure - Appreciate nephrology continued management of CVVHD - Reduce Keppra to 500 mg once daily while CRRT, paused  - When back on CRRT, increase to 1000 mg BID   - When back on iHD, return to 500 mg BID with an extra 500 post-dialysis on each dialysis day - Continue LTM today  -Neurology will continue to follow    CRITICAL CARE Performed by: Lorenza Chick  Total critical care time: 37 minutes   Critical care time was exclusive of separately billable procedures and treating other patients.  Critical care was necessary to treat or prevent imminent or life-threatening deterioration.  Critical care was time spent personally by me on the following activities: development of treatment plan with patient and/or surrogate as well as nursing, discussions with consultants, evaluation of patient's response to treatment, examination of patient, obtaining history from patient or surrogate, ordering and performing treatments and interventions, ordering and review of laboratory studies, ordering and review of radiographic studies, pulse oximetry and re-evaluation of patient's condition.

## 2020-05-26 NOTE — Progress Notes (Signed)
[facility] Progress Note Patient Name: Mary Wilcox DOB: Oct 12, 1955 MRN: [id number]   Date of Service  05/26/2020  HPI/Events of Note  Hemoglobin 6.6 from 8.6 yesterday.  K 5.4.   eICU Interventions  Recheck H/H given large drop without obvious source of bleeding. Has active T&S already.  Give 1x dose of Lokelma per OG tube.     Intervention Category Intermediate Interventions: Bleeding - evaluation and treatment with blood products;Electrolyte abnormality - evaluation and management  Charlott Rakes 05/26/2020, 5:36 AM

## 2020-05-26 NOTE — Progress Notes (Signed)
Daily Progress Note   Patient Name: Mary Wilcox       Date: 05/26/2020 DOB: January 19, 1956  Age: 65 y.o. MRN#: [id number] Attending Physician: Candee Furbish, MD Primary Care Physician: Kristen Loader, FNP Admit Date: 05/20/2020  Reason for Consultation/Follow-up:  To discuss complex medical decision making related to patient's goals of care  [name] BP stabilized.  Still on levo.  Calcium rose yesterday (is this what happens when she skips HD?).  CRRT restarted.  WBC and LFTs elevated.  PCCM attributes this to hypotension yesterday.   Subjective: Patient opens eyes when she coughs but does not see.  She grimaces to pain.  Otherwise non-responsive.  Lungs sounds are coarse.   Otherwise she appears comfortable.   Assessment: 65 yo female ESRD has been intermittently refusing treatment and HD for 1.5 years.  Currently being treated for PRES and severe sepsis.   Patient Profile/HPI:  65 y.o. female  with past medical history of ESRD on HD, PVD, cervical cancer, chronic hypotension on midodrine who was admitted on 06/09/2020 with altered mental status and seizures.  She was intubated for airway protection and diagnosed with PRES based on MRI findings.  She was switched from IHD to CRRT in order to gain tighter control over her blood pressure.  Repeat MRI on 3/9 showed worsening PRES. Early this morning (3/12) the patient's blood pressure dropped precipitously despite receiving bolus fluid, albumin and being on levophed.      Length of Stay: 8   Vital Signs: BP (!) 137/48   Pulse 86   Temp 98.7 F (37.1 C) (Axillary)   Resp 18   Ht 4\' 11"  (1.499 m)   Wt 74.6 kg   SpO2 100%   BMI 33.22 kg/m  SpO2: SpO2: 100 % O2 Device: O2 Device: Ventilator O2 Flow Rate:         Palliative  Assessment/Data: 10%     Palliative Care Plan    Recommendations/Plan:  Continue current care.  Heather to come to hospital tomorrow to see her mother and meet with Palliative.  Code Status:  DNR  Prognosis:  Patient is critically ill with PRES on CRRT, Pressors, Ventilator support.  No sedation.  She is at high risk for acute decline and death.  Discharge Planning:  To Be Determined  Care plan was  discussed with Daughter [name] and RN  Thank you for allowing the Palliative Medicine Team to assist in the care of this patient.  Total time spent:  25 min.     Greater than 50%  of this time was spent counseling and coordinating care related to the above assessment and plan.  Florentina Jenny, PA-C Palliative Medicine  Please contact Palliative MedicineTeam phone at [phone] for questions and concerns between 7 am - 7 pm.   Please see AMION for individual provider pager numbers.

## 2020-05-27 DIAGNOSIS — I6783 Posterior reversible encephalopathy syndrome: Secondary | ICD-10-CM

## 2020-05-27 DIAGNOSIS — Z515 Encounter for palliative care: Secondary | ICD-10-CM

## 2020-05-27 DIAGNOSIS — R569 Unspecified convulsions: Secondary | ICD-10-CM | POA: Diagnosis not present

## 2020-05-27 DIAGNOSIS — N186 End stage renal disease: Secondary | ICD-10-CM | POA: Diagnosis not present

## 2020-05-27 LAB — RENAL FUNCTION PANEL
Albumin: 2.6 g/dL — ABNORMAL LOW (ref 3.5–5.0)
Albumin: 2.5 g/dL — ABNORMAL LOW (ref 3.5–5.0)
Anion gap: 12 (ref 5–15)
Anion gap: 9 (ref 5–15)
BUN: 40 mg/dL — ABNORMAL HIGH (ref 8–23)
BUN: 33 mg/dL — ABNORMAL HIGH (ref 8–23)
CO2: 22 mmol/L (ref 22–32)
CO2: 24 mmol/L (ref 22–32)
Calcium: 11.5 mg/dL — ABNORMAL HIGH (ref 8.9–10.3)
Calcium: 11.4 mg/dL — ABNORMAL HIGH (ref 8.9–10.3)
Chloride: 107 mmol/L (ref 98–111)
Chloride: 107 mmol/L (ref 98–111)
Creatinine, Ser: 1.77 mg/dL — ABNORMAL HIGH (ref 0.44–1.00)
Creatinine, Ser: 1.5 mg/dL — ABNORMAL HIGH (ref 0.44–1.00)
GFR, Estimated: 31 mL/min — ABNORMAL LOW (ref >60)
GFR, Estimated: 38 mL/min — ABNORMAL LOW (ref >60)
Glucose, Bld: 167 mg/dL — ABNORMAL HIGH (ref 70–99)
Glucose, Bld: 153 mg/dL — ABNORMAL HIGH (ref 70–99)
Phosphorus: 4 mg/dL (ref 2.5–4.6)
Phosphorus: 4.1 mg/dL (ref 2.5–4.6)
Potassium: 4.3 mmol/L (ref 3.5–5.1)
Potassium: 4 mmol/L (ref 3.5–5.1)
Sodium: 141 mmol/L (ref 135–145)
Sodium: 140 mmol/L (ref 135–145)

## 2020-05-27 LAB — BPAM RBC
Blood Product Expiration Date: 202204022359
Blood Product Expiration Date: 202204052359
ISSUE DATE / TIME: 202203130802
ISSUE DATE / TIME: 202203130802
Unit Type and Rh: 6200
Unit Type and Rh: 6200

## 2020-05-27 LAB — CBC
HCT: 27.5 % — ABNORMAL LOW (ref 36.0–46.0)
Hemoglobin: 9.4 g/dL — ABNORMAL LOW (ref 12.0–15.0)
MCH: 29.9 pg (ref 26.0–34.0)
MCHC: 34.2 g/dL (ref 30.0–36.0)
MCV: 87.6 fL (ref 80.0–100.0)
Platelets: 149 10*3/uL — ABNORMAL LOW (ref 150–400)
RBC: 3.14 MIL/uL — ABNORMAL LOW (ref 3.87–5.11)
RDW: 17.1 % — ABNORMAL HIGH (ref 11.5–15.5)
WBC: 38.1 10*3/uL — ABNORMAL HIGH (ref 4.0–10.5)
nRBC: 2.5 % — ABNORMAL HIGH (ref 0.0–0.2)

## 2020-05-27 LAB — TYPE AND SCREEN
ABO/RH(D): AB POS
Antibody Screen: NEGATIVE
Unit division: 0
Unit division: 0

## 2020-05-27 LAB — HEPATIC FUNCTION PANEL
ALT: 1439 U/L — ABNORMAL HIGH (ref 0–44)
AST: 575 U/L — ABNORMAL HIGH (ref 15–41)
Albumin: 2.6 g/dL — ABNORMAL LOW (ref 3.5–5.0)
Alkaline Phosphatase: 97 U/L (ref 38–126)
Bilirubin, Direct: 0.4 mg/dL — ABNORMAL HIGH (ref 0.0–0.2)
Indirect Bilirubin: 0.8 mg/dL (ref 0.3–0.9)
Total Bilirubin: 1.2 mg/dL (ref 0.3–1.2)
Total Protein: 6 g/dL — ABNORMAL LOW (ref 6.5–8.1)

## 2020-05-27 LAB — GLUCOSE, CAPILLARY
Glucose-Capillary: 153 mg/dL — ABNORMAL HIGH (ref 70–99)
Glucose-Capillary: 158 mg/dL — ABNORMAL HIGH (ref 70–99)
Glucose-Capillary: 161 mg/dL — ABNORMAL HIGH (ref 70–99)
Glucose-Capillary: 127 mg/dL — ABNORMAL HIGH (ref 70–99)
Glucose-Capillary: 135 mg/dL — ABNORMAL HIGH (ref 70–99)
Glucose-Capillary: 114 mg/dL — ABNORMAL HIGH (ref 70–99)
Glucose-Capillary: 125 mg/dL — ABNORMAL HIGH (ref 70–99)

## 2020-05-27 LAB — CSF CULTURE W GRAM STAIN

## 2020-05-27 LAB — MAGNESIUM: Magnesium: 2.7 mg/dL — ABNORMAL HIGH (ref 1.7–2.4)

## 2020-05-27 LAB — PATHOLOGIST SMEAR REVIEW

## 2020-05-27 LAB — CALCIUM
Calcium: 11.3 mg/dL — ABNORMAL HIGH (ref 8.9–10.3)
Calcium: 11.3 mg/dL — ABNORMAL HIGH (ref 8.9–10.3)
Calcium: 11.4 mg/dL — ABNORMAL HIGH (ref 8.9–10.3)
Calcium: 10.9 mg/dL — ABNORMAL HIGH (ref 8.9–10.3)

## 2020-05-27 LAB — APTT: aPTT: 39 seconds — ABNORMAL HIGH (ref 24–36)

## 2020-05-27 LAB — PROCALCITONIN: Procalcitonin: 63.77 ng/mL

## 2020-05-27 MED ORDER — SODIUM CHLORIDE 0.9 % IV SOLN
1.0000 g | INTRAVENOUS | Status: DC
  Administered 2020-05-27: 1 g via INTRAVENOUS
  Filled 2020-05-27: qty 1
  Filled 2020-05-27: qty 10

## 2020-05-27 MED ORDER — FENTANYL CITRATE (PF) 100 MCG/2ML IJ SOLN
12.5000 ug | INTRAMUSCULAR | Status: AC | PRN
  Administered 2020-05-28 (×2): 25 ug via INTRAVENOUS
  Filled 2020-05-27 (×2): qty 2

## 2020-05-27 MED ORDER — PIPERACILLIN-TAZOBACTAM 3.375 G IVPB: 3.3750 g | Freq: Three times a day (TID) | INTRAVENOUS | Status: DC

## 2020-05-27 MED ORDER — PIPERACILLIN-TAZOBACTAM 3.375 G IVPB 30 MIN
3.3750 g | Freq: Four times a day (QID) | INTRAVENOUS | Status: DC
  Administered 2020-05-27: 3.375 g via INTRAVENOUS
  Filled 2020-05-27 (×3): qty 50

## 2020-05-27 MED ORDER — LEVETIRACETAM IN NACL 1000 MG/100ML IV SOLN
1000.0000 mg | Freq: Two times a day (BID) | INTRAVENOUS | Status: DC
  Administered 2020-05-27 – 2020-05-28 (×4): 1000 mg via INTRAVENOUS
  Filled 2020-05-27 (×4): qty 100

## 2020-05-27 NOTE — Progress Notes (Addendum)
Addendum: sensitivities resulted for serratia - will de-escalate to ceftriaxone.   Pharmacy Antibiotic Note  Mary Wilcox is a 65 y.o. female admitted on 05/23/2020 with AMS.  S/p acyclovir for rule out encephalitis.  Now with Staph PNA and Pharmacy has been consulted for vancomycin. Cefepime was added for septic shock.   Patient has ESRD on TTS HD, but was transitioned to CRRT for hemodynamic stability. Patient remains anuric. 3/8 TA is grew pansensitive MSSA, and repeat on 3/12 is showing abundant serratia. Given patient's encephalopathy and seizures, will transition to cefepime to piperacillin-tazobactam for staph and serratia coverage and complete 7 days of therapy total.   Plan: D/c vancomycin and cefepime Initiate piperacillin-tazobactam IV every 6 hours (30 min infusion) while on CRRT  Monitor CRRT interruption/tolerance, cultures/sensitivities, clinical progression  Height: 4\' 11"  (149.9 cm) Weight: 71.7 kg (158 lb 1.1 oz) IBW/kg (Calculated) : 43.2  Temp (24hrs), Avg:98.3 F (36.8 C), Min:97 F (36.1 C), Max:99.1 F (37.3 C)  Recent Labs  Lab 05/23/20 0356 05/23/20 1531 05/24/20 0349 05/24/20 1726 05/25/20 0544 05/25/20 0810 05/25/20 1047 05/26/20 0407 05/26/20 0825 05/26/20 1230 05/26/20 1540 05/27/20 0433 05/27/20 0435  WBC 15.6*  --  11.5*  --   --  26.4*  --  41.9*  --   --   --  38.1*  --   CREATININE 2.08*   < > 1.59* 1.34* 1.12*  --   --  3.00*  --   --  2.83*  --  1.77*  LATICACIDVEN  --   --   --   --   --  3.9* 2.9*  --  2.4* 1.5  --   --   --    < > = values in this interval not displayed.    Estimated Creatinine Clearance: 26.9 mL/min (A) (by C-G formula based on SCr of 1.77 mg/dL (H)).    Allergies  Allergen Reactions  . Prednisone     Other reaction(s): severe muscle weakness in legs  . Prozac [Fluoxetine Hcl] Hives    3/6 MRSA PCR - negative 3/8 TA - pansensitive MSSA 3/12 BCx - ngtd  3/12 TA - mod GPC in pairs, abundant serratia    Vancomycin 3/10>>3/14 Cefepime 3/12>>3/14 Piperacillin-tazobactam 3/14 >> 3/19  Mercy Riding, PharmD PGY1 Acute Care Pharmacy Resident  Please check [email or web address] for unit-specific pharmacy phone numbers.

## 2020-05-27 NOTE — Progress Notes (Signed)
EEG unhook in process

## 2020-05-27 NOTE — Progress Notes (Signed)
[facility] ROUNDING NOTE   Subjective:   Brief history: 65 year old lady with a history of end-stage renal disease dialysis dependent   she has been admitted since 06/08/2020.  She has a past history of hypothyroidism peripheral vascular disease, cervical cancer 2006.  In the emergency room she developed new onset seizures with acute encephalopathy and hypertensive urgency.  EEG confirmed a large frontal seizure.  MRI was concerning for PRES.  Patient is usually dialyzed Tuesday Thursday Saturday [facility] and has been transitioned to CRRT 05/22/2020.  She continues on CRRT.   She has large diarrhea stools.  She received a blood transfusion 05/21/2020 and 05/26/2020  Blood pressure 129/56 pulse 87 temperature 97.6 O2 sats 96% 30% FiO2  Sodium 141 potassium 4.3 chloride 107 CO2 22 BUN 40 creatinine 1.77 glucose 167 calcium 11.5 phosphorus 4 albumin 2.6 hemoglobin 9.4   Objective:  Vital signs in last 24 hours:  Temp:  [97 F (36.1 C)-99.1 F (37.3 C)] 97.6 F (36.4 C) (03/13 2336) Pulse Rate:  [67-97] 87 (03/14 0600) Resp:  [16-28] 21 (03/14 0600) BP: (77-173)/(42-127) 129/56 (03/14 0600) SpO2:  [91 %-100 %] 95 % (03/14 0600) Arterial Line BP: (74-159)/(46-157) 115/109 (03/13 1700) FiO2 (%):  [30 %] 30 % (03/14 0400) Weight:  [71.7 kg] 71.7 kg (03/14 0446)  Weight change: -2.9 kg Filed Weights   05/25/20 0500 05/26/20 0433 05/27/20 0446  Weight: 75.1 kg 74.6 kg 71.7 kg    Intake/Output: I/O last 3 completed shifts: In: 6920.1 [I.V.:2633.8; Blood:1672.1; NG/GT:1330; IV Piggyback:1284.2] Out: 5067 [Other:567; Stool:4500]   Intake/Output this shift:  Total I/O In: 1085 [I.V.:355; NG/GT:630; IV Piggyback:100] Out: 999 [Stool:1000]  CVS- RRR RS- CTA ABD- BS present soft non-distended EXT- no edema   Basic Metabolic Panel: Recent Labs  Lab 05/23/20 0356 05/23/20 1531 05/24/20 0349 05/24/20 1726 05/25/20 0544 05/25/20 0757 05/26/20 0407  05/26/20 0825 05/26/20 1230 05/26/20 1540 05/26/20 2114 05/27/20 0101 05/27/20 0433 05/27/20 0435  NA 137   < > 136 136 137 135 136  --   --  140  --   --   --  141  K 4.3   < > 4.8 4.6 5.1 5.0 5.4*  --   --  4.4  --   --   --  4.3  CL 102   < > 101 102 101  --  107  --   --  109  --   --   --  107  CO2 27   < > _0 --  16*  --   --  17*  --   --   --  22  GLUCOSE 136*   < > 153* 198* 176*  --  199*  --   --  201*  --   --   --  167*  BUN 24*   < > 29* 24* 22  --  60*  --   --  58*  --   --   --  40*  CREATININE 2.08*   < > 1.59* 1.34* 1.12*  --  3.00*  --   --  2.83*  --   --   --  1.77*  CALCIUM 10.4*   < > 10.7* 10.8* 11.6*  --  13.3*  --    < > 11.9*  11.6*   < > 11.3* 11.3* 11.5*  MG 2.4  --  2.6*  --  2.7*  --   --  2.9*  --   --   --   --  2.7*  --   PHOS 2.7   < > 3.9 3.2 4.1  --   --  5.4*  --  4.5  --   --   --  4.0   < > = values in this interval not displayed.    Liver Function Tests: Recent Labs  Lab 05/25/20 0810 05/26/20 0407 05/26/20 1540 05/27/20 0433 05/27/20 0435  AST 1,580* 2,008*  --  575*  --   ALT 1,244* 2,116*  --  1,439*  --   ALKPHOS 84 81  --  97  --   BILITOT 1.1 1.0  --  1.2  --   PROT 6.1* 6.3*  --  6.0*  --   ALBUMIN 2.4* 3.6 3.0* 2.6* 2.6*   No results for input(s): LIPASE, AMYLASE in the last 168 hours. No results for input(s): AMMONIA in the last 168 hours.  CBC: Recent Labs  Lab 05/23/20 0356 05/24/20 0349 05/25/20 0757 05/25/20 0810 05/26/20 0407 05/26/20 0539 05/26/20 2114 05/27/20 0433  WBC 15.6* 11.5*  --  26.4* 41.9*  --   --  38.1*  HGB 9.2* 8.2*   < > 8.6* 6.6* 6.2* 9.8* 9.4*  HCT 28.2* 25.3*   < > 25.6* 19.8* 18.6* 28.4* 27.5*  MCV 91.0 91.7  --  90.1 90.8  --   --  87.6  PLT 239 213  --  200 187  --   --  149*   < > = values in this interval not displayed.    Cardiac Enzymes: No results for input(s): CKTOTAL, CKMB, CKMBINDEX, TROPONINI in the last 168 hours.  BNP: Invalid input(s): POCBNP  CBG: Recent  Labs  Lab 05/26/20 1546 05/26/20 1935 05/26/20 2331 05/27/20 0107 05/27/20 0437  GLUCAP 191* 156* 71 153* 158*    Microbiology: Results for orders placed or performed during the hospital encounter of 06/12/2020  Resp Panel by RT-PCR (Flu A&B, Covid) Nasopharyngeal Swab     Status: None   Collection Time: 05/28/2020 12:44 PM   Specimen: Nasopharyngeal Swab; Nasopharyngeal(NP) swabs in vial transport medium  Result Value Ref Range Status   SARS Coronavirus 2 by RT PCR NEGATIVE NEGATIVE Final    Comment: (NOTE) SARS-CoV-2 target nucleic acids are NOT DETECTED.  The SARS-CoV-2 RNA is generally detectable in upper respiratory specimens during the acute phase of infection. The lowest concentration of SARS-CoV-2 viral copies this assay can detect is 138 copies/mL. A negative result does not preclude SARS-Cov-2 infection and should not be used as the sole basis for treatment or other patient management decisions. A negative result may occur with  improper specimen collection/handling, submission of specimen other than nasopharyngeal swab, presence of viral mutation(s) within the areas targeted by this assay, and inadequate number of viral copies(<138 copies/mL). A negative result must be combined with clinical observations, patient history, and epidemiological information. The expected result is Negative.  Fact Sheet for Patients:  EntrepreneurPulse.com.au  Fact Sheet for Healthcare Providers:  IncredibleEmployment.be  This test is no t yet approved or cleared by the [place] FDA and  has been authorized for detection and/or diagnosis of SARS-CoV-2 by FDA under an Emergency Use Authorization (EUA). This EUA will remain  in effect (meaning this test can be used) for the duration of the COVID-19 declaration under Section 564(b)(1) of the Act, 21 U.S.C.section 360bbb-3(b)(1), unless the authorization is terminated  or revoked sooner.        Influenza A by PCR NEGATIVE NEGATIVE Final   Influenza B by PCR  NEGATIVE NEGATIVE Final    Comment: (NOTE) The Xpert Xpress SARS-CoV-2/FLU/RSV plus assay is intended as an aid in the diagnosis of influenza from Nasopharyngeal swab specimens and should not be used as a sole basis for treatment. Nasal washings and aspirates are unacceptable for Xpert Xpress SARS-CoV-2/FLU/RSV testing.  Fact Sheet for Patients: EntrepreneurPulse.com.au  Fact Sheet for Healthcare Providers: IncredibleEmployment.be  This test is not yet approved or cleared by the [place] FDA and has been authorized for detection and/or diagnosis of SARS-CoV-2 by FDA under an Emergency Use Authorization (EUA). This EUA will remain in effect (meaning this test can be used) for the duration of the COVID-19 declaration under Section 564(b)(1) of the Act, 21 U.S.C. section 360bbb-3(b)(1), unless the authorization is terminated or revoked.  Performed at [facility], [place] [address] [place], [place] [address]   CSF culture w Gram Stain     Status: None   Collection Time: 06/04/2020  5:34 PM   Specimen: PATH Cytology CSF; Cerebrospinal Fluid  Result Value Ref Range Status   Specimen Description CSF  Final   Special Requests NONE  Final   Gram Stain   Final    WBC PRESENT,BOTH PMN AND MONONUCLEAR NO ORGANISMS SEEN CYTOSPIN SMEAR    Culture   Final    NO GROWTH Performed at [facility], [address], [place], [place] [address]    Report Status 05/22/2020 FINAL  Final  MRSA PCR Screening     Status: None   Collection Time: 05/19/20  1:49 AM   Specimen: Nasopharyngeal  Result Value Ref Range Status   MRSA by PCR NEGATIVE NEGATIVE Final    Comment:        The GeneXpert MRSA Assay (FDA approved for NASAL specimens only), is one component of a comprehensive MRSA colonization surveillance program. It is not intended to diagnose MRSA infection nor to guide  or monitor treatment for MRSA infections. Performed at [facility], [place] [address], [place], [place] [address]   Culture, Respiratory w Gram Stain     Status: None   Collection Time: 05/21/20 12:17 PM   Specimen: Tracheal Aspirate; Respiratory  Result Value Ref Range Status   Specimen Description TRACHEAL ASPIRATE  Final   Special Requests NONE  Final   Gram Stain   Final    ABUNDANT WBC PRESENT, PREDOMINANTLY PMN ABUNDANT GRAM POSITIVE COCCI IN PAIRS FEW GRAM NEGATIVE COCCI FEW GRAM NEGATIVE RODS RARE GRAM POSITIVE RODS    Culture   Final    ABUNDANT STAPHYLOCOCCUS AUREUS WITHIN NORMAL RESPIRARTIRY FLORA Performed at [facility], [place] [address] [place], [place] [address]    Report Status 05/24/2020 FINAL  Final   Organism ID, Bacteria STAPHYLOCOCCUS AUREUS  Final      Susceptibility   Staphylococcus aureus - MIC*    CIPROFLOXACIN <=0.5 SENSITIVE Sensitive     ERYTHROMYCIN <=0.25 SENSITIVE Sensitive     GENTAMICIN <=0.5 SENSITIVE Sensitive     OXACILLIN 0.5 SENSITIVE Sensitive     TETRACYCLINE <=1 SENSITIVE Sensitive     VANCOMYCIN <=0.5 SENSITIVE Sensitive     TRIMETH/SULFA <=10 SENSITIVE Sensitive     CLINDAMYCIN <=0.25 SENSITIVE Sensitive     RIFAMPIN <=0.5 SENSITIVE Sensitive     Inducible Clindamycin NEGATIVE Sensitive     * ABUNDANT STAPHYLOCOCCUS AUREUS  Culture, blood (routine x 2)     Status: None (Preliminary result)   Collection Time: 05/25/20  8:00 AM   Specimen: BLOOD LEFT  HAND  Result Value Ref Range Status   Specimen Description BLOOD LEFT HAND  Final   Special Requests   Final    BOTTLES DRAWN AEROBIC ONLY Blood Culture results may not be optimal due to an inadequate volume of blood received in culture bottles   Culture   Final    NO GROWTH 1 DAY Performed at [place] [address] [place], [place] [address]    Report Status PENDING  Incomplete  Culture, blood (routine x 2)     Status: None (Preliminary result)    Collection Time: 05/25/20  8:15 AM   Specimen: BLOOD LEFT HAND  Result Value Ref Range Status   Specimen Description BLOOD LEFT HAND  Final   Special Requests   Final    BOTTLES DRAWN AEROBIC ONLY Blood Culture results may not be optimal due to an inadequate volume of blood received in culture bottles   Culture   Final    NO GROWTH 1 DAY Performed at [facility], [place] [address], [place], [place] [address]    Report Status PENDING  Incomplete  Culture, Respiratory w Gram Stain     Status: None (Preliminary result)   Collection Time: 05/25/20 10:47 AM   Specimen: Tracheal Aspirate; Respiratory  Result Value Ref Range Status   Specimen Description TRACHEAL ASPIRATE  Final   Special Requests NONE  Final   Gram Stain   Final    ABUNDANT WBC PRESENT,BOTH PMN AND MONONUCLEAR ABUNDANT GRAM NEGATIVE RODS MODERATE GRAM POSITIVE COCCI IN PAIRS    Culture   Final    ABUNDANT SERRATIA MARCESCENS SUSCEPTIBILITIES TO FOLLOW CULTURE REINCUBATED FOR BETTER GROWTH Performed at [facility], [place] [address], [place], [place] [address]    Report Status PENDING  Incomplete    Coagulation Studies: Recent Labs    05/25/20 0810 05/26/20 0407  LABPROT 17.7* 21.1*  INR 1.5* 1.9*    Urinalysis: No results for input(s): COLORURINE, LABSPEC, PHURINE, GLUCOSEU, HGBUR, BILIRUBINUR, KETONESUR, PROTEINUR, UROBILINOGEN, NITRITE, LEUKOCYTESUR in the last 72 hours.  Invalid input(s): APPERANCEUR    Imaging: DG CHEST PORT 1 VIEW  Result Date: 05/25/2020 CLINICAL DATA:  Respiratory distress EXAM: PORTABLE CHEST 1 VIEW COMPARISON:  May 22, 2020 FINDINGS: Bilateral central lines, the ETT, and a left vascular stent are stable. The cardiomediastinal silhouette is stable. No pneumothorax. The lungs are clear. A feeding tube terminates below today's film. IMPRESSION: 1. Support apparatus as above. 2. No other acute abnormalities. Electronically Signed   By: Dorise Bullion III M.D   On: 05/25/2020  07:19   ECHOCARDIOGRAM LIMITED  Result Date: 05/25/2020    ECHOCARDIOGRAM LIMITED REPORT   Patient Name:   Mary Wilcox Date of Exam: 05/25/2020 Medical Rec #:  [id number]       Height:       59.0 in Accession #:    [id number]      Weight:       165.6 lb Date of Birth:  1955/09/10      BSA:          1.702 m Patient Age:    2 years        BP:           100/48 mmHg Patient Gender: F               HR:           96 bpm. Exam Location:  Inpatient Procedure: Limited Echo, Limited Color Doppler  and Cardiac Doppler Indications:    cardiomyopathy  History:        Patient has no prior history of Echocardiogram examinations. End                 stage renal disease; Risk Factors:Hypertension.  Sonographer:    Johny Chess Referring Phys: [id number] [place]  1. Left ventricular ejection fraction, by estimation, is 60 to 65%. The left ventricle has normal function. There is mild concentric left ventricular hypertrophy. Left ventricular diastolic parameters are consistent with Grade I diastolic dysfunction (impaired relaxation).  2. Right ventricular systolic function is normal. The right ventricular size is normal.  3. The mitral valve is grossly normal. Trivial mitral valve regurgitation.  4. The aortic valve was not well visualized. Aortic valve regurgitation is not visualized. Mild aortic valve sclerosis is present, with no evidence of aortic valve stenosis.     There are mildly elevated gradients across the aortic valve, however, based on limited views, the valve appears to open well with low suspicion for significant AS. DI 0.79.  5. The inferior vena cava is normal in size with greater than 50% respiratory variability, suggesting right atrial pressure of 3 mmHg. Comparison(s): No prior Echocardiogram. FINDINGS  Left Ventricle: Left ventricular ejection fraction, by estimation, is 60 to 65%. The left ventricle has normal function. The left ventricular internal cavity size was normal in size. There  is mild concentric left ventricular hypertrophy. Left ventricular diastolic parameters are consistent with Grade I diastolic dysfunction (impaired relaxation). Right Ventricle: The right ventricular size is normal. Right ventricular systolic function is normal. Left Atrium: Left atrial size was normal in size. Right Atrium: Right atrial size was normal in size. Pericardium: There is no evidence of pericardial effusion. Mitral Valve: The mitral valve is grossly normal. There is mild thickening of the mitral valve leaflet(s). There is mild calcification of the mitral valve leaflet(s). Mild mitral annular calcification. Trivial mitral valve regurgitation. Tricuspid Valve: The tricuspid valve is normal in structure. Tricuspid valve regurgitation is trivial. Aortic Valve: There are mildly elevated gradients across the aortic valve, however, based on limited views, the valve appears to open well with low suspicion for significant AS. The aortic valve was not well visualized. Aortic valve regurgitation is not visualized. Mild aortic valve sclerosis is present, with no evidence of aortic valve stenosis. Aortic valve mean gradient measures 13.0 mmHg. Aortic valve peak gradient measures 26.8 mmHg. Aortic valve area, by VTI measures 0.91 cm. Pulmonic Valve: The pulmonic valve was not assessed. Aorta: The aortic root is normal in size and structure. Venous: The inferior vena cava is normal in size with greater than 50% respiratory variability, suggesting right atrial pressure of 3 mmHg. LEFT VENTRICLE PLAX 2D LVIDd:         3.00 cm  Diastology LVIDs:         1.90 cm  LV e' medial:    7.94 cm/s LV PW:         0.80 cm  LV E/e' medial:  8.8 LV IVS:        0.80 cm  LV e' lateral:   14.50 cm/s LVOT diam:     1.30 cm  LV E/e' lateral: 4.8 LV SV:         30 LV SV Index:   17 LVOT Area:     1.33 cm  IVC IVC diam: 1.70 cm AORTIC VALVE AV Area (Vmax):    0.92 cm AV Area (Vmean):  0.93 cm AV Area (VTI):     0.91 cm AV Vmax:            259.00 cm/s AV Vmean:          160.000 cm/s AV VTI:            0.328 m AV Peak Grad:      26.8 mmHg AV Mean Grad:      13.0 mmHg LVOT Vmax:         180.00 cm/s LVOT Vmean:        112.000 cm/s LVOT VTI:          0.224 m LVOT/AV VTI ratio: 0.68 MITRAL VALVE MV Area (PHT): 2.52 cm     SHUNTS MV Decel Time: 301 msec     Systemic VTI:  0.22 m MV E velocity: 69.60 cm/s   Systemic Diam: 1.30 cm MV A velocity: 119.00 cm/s MV E/A ratio:  0.58 Gwyndolyn Kaufman MD Electronically signed by Gwyndolyn Kaufman MD Signature Date/Time: 05/25/2020/7:05:20 PM    Final      Medications:   .  prismasol BGK 4/2.5 400 mL/hr at 05/27/20 0009  . sodium chloride 20 mL/hr at 05/27/20 0602  . sodium chloride    . sodium chloride 250 mL (05/25/20 0800)  . ceFEPime (MAXIPIME) IV    . feeding supplement (VITAL 1.5 CAL) 1,000 mL (05/26/20 1708)  . fentaNYL infusion INTRAVENOUS Stopped (05/27/20 0548)  . heparin 10,000 units/ 20 mL infusion syringe 600 Units/hr (05/26/20 2006)  . levETIRAcetam Stopped (05/26/20 2136)  . norepinephrine (LEVOPHED) Adult infusion Stopped (05/26/20 1256)  . prismasol B22GK 4/0 1,800 mL/hr at 05/27/20 0534  . sodium bicarbonate (isotonic) 1000 mL infusion 100 mL/hr at 05/26/20 2103  . vancomycin    . vasopressin Stopped (05/26/20 0733)   . chlorhexidine gluconate (MEDLINE KIT)  15 mL Mouth Rinse BID  . Chlorhexidine Gluconate Cloth  6 each Topical Daily  . docusate  100 mg Per Tube BID  . feeding supplement (PROSource TF)  45 mL Per Tube QID  . fentaNYL (SUBLIMAZE) injection  25 mcg Intravenous Once  . hydrocortisone sod succinate (SOLU-CORTEF) inj  50 mg Intravenous Q6H  . insulin aspart  0-6 Units Subcutaneous Q4H  . mouth rinse  15 mL Mouth Rinse 10 times per day  . multivitamin  1 tablet Per Tube QHS  . nystatin  5 mL Mouth/Throat QID  . pantoprazole sodium  40 mg Per Tube Q1200  . sodium chloride flush  10-40 mL Intracatheter Q12H   acetaminophen (TYLENOL) oral liquid 160 mg/5  mL, alteplase, fentaNYL, heparin, heparin, labetalol, polyethylene glycol, sodium chloride, sodium chloride flush  Assessment/ Plan:  OP HD: TTS AF  3h 9mn  75.5kg  2/2 bath Hep 7400  RIJ TDC Mircera 60 MCGq2wks last given 05/07/20  Venofer 50 weekly  Assessment/Plan 1. Shock / hypothermia - stabilizing improved blood pressures 2. HTN crisis/ PRES by MRI/ seizures: hx of missing HD/ chronic vol overload/ poor compliance. 2nd MRI 3/9+ worsening PRES. Pt was transitioned from iHD to CRRT 3/9 to avoid the large volume shifts of regular HD. BP's better now and off cleviprex.   3. Hypercalcemia - severe up to 13 today. Will use zero Ca++ bath w/ q 4 hr Ca labs, may need pamidronate as well. Suspect due to immobilization.  4. ESRD - on HD since 2012. HD TTS.  CRRT started on 3/09. K+ up, continue CRRT 5. Vol overload: on admission was up 8kg >>  under  dry wt now. FU CXR w/o edema. 6. Anemia of ESRD -Hgb 8's.  SP darbe 150ug on 3/08 here. Will resume next week now that BP's under control.  7. Metabolic bone disease - calcium 8.3 no vitamin D at outpatient unit, continue calcium acetate and Sensipar when taking p.o.'s. 8. Nutrition -ALB 3.3,currently n.p.o. 9. FTT - chronic SNF resident at [facility].  10. GOC - seen by palliative care. Given SNF dependence, hx of refusal of care and ++sig comorbidities would favor transition to comfort care.      LOS: [place] _0 _1 :11 AM

## 2020-05-27 NOTE — Procedures (Signed)
Patient Name:Keyundra ELYSE PREVO BTD:[id number] Epilepsy Attending:Estell Dillinger Barbra Sarks Referring Physician/Provider:Dr Lesleigh Noe Duration:05/26/2020 1312 to 05/27/2020 1507  Patient history:66 year old female being evaluated for altered mental status. EEG to evaluate for seizure  Level of alertness:comatose  AEDs during EEG study:LEV  Technical aspects: This EEG study was done with scalp electrodes positioned according to the 10-20 International system of electrode placement. Electrical activity was acquired at a sampling rate of 500Hz  and reviewed with a high frequency filter of 70Hz  and a low frequency filter of 1Hz . EEG data were recorded continuously and digitally stored.   Description:EEG showed continuous generalized 2-3Hz  high amplitude delta slowing. Periodic discharges with triphasic morphology at 0.5 to 1 Hz were noted, and appeared more sharply contoured when patient was stimulated.  ABNORMALITY - Periodic discharges with triphasic morphology, generalized - Continuousslow, generalized  IMPRESSION: This study is suggestive of severe diffuse encephalopathy, nonspecific etiology.Generalized periodic discharges with triphasic morphology were noted at 0.5-1Hz  which is on the ictal-interictal continuum with low potential for seizures. No seizures were seen throughout the recording.  Jaquell Seddon Barbra Sarks

## 2020-05-27 NOTE — Progress Notes (Signed)
[facility] Progress Note Patient Name: Mary Wilcox DOB: [birth year] MRN: [id number]   Date of Service  05/27/2020  HPI/Events of Note  Patient is on the ventilator and needs a PRN order for Fentanyl.  eICU Interventions  Fentanyl 12.5 -25  Mcg iv Q 4 hours PRN agitation ordered.        Frederik Pear 05/27/2020, 11:31 PM

## 2020-05-27 NOTE — Progress Notes (Signed)
Daily Progress Note   Patient Name: Mary Wilcox       Date: 05/27/2020 DOB: 04-09-1955  Age: 65 y.o. MRN#: [id number] Attending Physician: Candee Furbish, MD Primary Care Physician: Kristen Loader, FNP Admit Date: 05/27/2020  Reason for Consultation/Follow-up: To discuss complex medical decision making related to patient's goals of care  Subjective: Dr. Alfonse Spruce and I rounded on Ms. Marlowe this am.  Fortunately she is off of pressors and BP appears stable back on CRRT.  Calcium is down somewhat.  WBC still terribly elevated.  Liver enzymes improving.   Patient will open her eyes when she coughs.  At times she will open her eyes to her name spoken loudly.  Otherwise I'm unable to elicit a response from her.    Daughter [name] is to meet me in the hospital today.  I've asked Emer, ICU RN to page me when she arrives. As the day is starting to slip away I called [name] but received her voice mail.  Left a message that I'm happy to give her an update either in person or on the phone and requested a return call.   Assessment: Given her most recent imaging showing worsening of swelling in her brain and her lack of responsiveness I'm concerned that there may be a degree of irreversible damage.     Patient Profile/HPI:  65 y.o. female  with past medical history of ESRD on HD, PVD, cervical cancer, chronic hypotension on midodrine who was admitted on 05/21/2020 with altered mental status and seizures.  She was intubated for airway protection and diagnosed with PRES based on MRI findings.  She was switched from IHD to CRRT in order to gain tighter control over her blood pressure.  Repeat MRI on 3/9 showed worsening PRES.  Early this morning (3/12) the patient's blood pressure dropped precipitously  despite receiving bolus fluid, albumin and being on levophed.    Length of Stay: 9   Vital Signs: BP 131/70   Pulse 91   Temp 99.6 F (37.6 C) (Axillary)   Resp 20   Ht 4\' 11"  (1.499 m)   Wt 71.7 kg   SpO2 99%   BMI 31.93 kg/m  SpO2: SpO2: 99 % O2 Device: O2 Device: Ventilator O2 Flow Rate:         Palliative Assessment/Data:  10%     Palliative Care Plan    Recommendations/Plan:  PMT will continue to follow Mrs. Carone and reach out to [employer].    Patient is a DNR and [name] did not believe she would want aggressive resuscitation if she arrested.  Per [name] patient has been intermittently refusing HD and therapies for 1.5 years.  She lives in [place].  Need to talk with [name] about Quality of Life and next steps, (1) trach/PEG/CRRT LTAC connected to machines hoping for further recovery vs comfort measures.  Code Status:  DNR  Prognosis:  Even with full continued support patient's prognosis is very poor.  Discharge Planning:  To Be Determined  LTAC vs Hospital Death  Care plan was discussed with Medical Team.  Reached out to daughter - hoping to hear back from her.  Thank you for allowing the Palliative Medicine Team to assist in the care of this patient.  Total time spent:  35 min.     Greater than 50%  of this time was spent counseling and coordinating care related to the above assessment and plan.  Florentina Jenny, PA-C Palliative Medicine  Please contact Palliative MedicineTeam phone at [phone] for questions and concerns between 7 am - 7 pm.   Please see AMION for individual provider pager numbers.

## 2020-05-27 NOTE — Progress Notes (Signed)
Subjective: No significant changes  Exam: Vitals:   05/27/20 0630 05/27/20 0700  BP: (!) 127/57 132/60  Pulse: 87 89  Resp: (!) 21 (!) 21  Temp:    SpO2: 95% 95%   Gen: In bed, NAD Resp: non-labored breathing, no acute distress Abd: soft, nt  Neuro: MS: Opens eyes to noxious sitmulation, does not engage the examiner.  OZ:HYQMV, eyes midline, corneals intact. Unclear blink to threat given frequent blinking Motor: no movement to noxious  Sensory:grimaces to nox stim in bilateral upper extremities.   Pertinent Results: MRI brain -her MRI brain on the sixth was consistent with pres.  The areas of cortical edema are the predominant areas of restricted diffusion on the subsequent MRI, however there are also punctate end-arterial white matter infarcts on the subsequent an MRI as well which is not typical of pres.  I suspect it may represent a hypoxic/ischemic insult superimposed on posterior reversible encephalopathy syndrome.  Impression: 65 year old female who presented with PRES who has unfortunately had issues with both sepsis and severely labile blood pressures.  She has evidence of liver injury secondary to her shock, and I suspect that she has had some degree of hypoxic-ischemic injury as well.  She may have significant recovery over time, but I suspect it will be quite prolonged and possibly incomplete. Given her other medical issues, I think it is reasonable to continue palliative discussions, but from a purely neurological perspective, I would favor continued observation for now.   Also, with increasing triphasic waves, I would favor changing cefepime to another agent as this can be associated with an encephalopathy with triphasic waves.   Recommendations: 1) D/c cefepime 2) continue keppra for now, though can d/c LTM EEG 3) check ammonia.  4) will follow.   This patient is critically ill and at significant risk of neurological worsening, death and care requires constant  monitoring of vital signs, hemodynamics,respiratory and cardiac monitoring, neurological assessment, discussion with family, other specialists and medical decision making of high complexity. I spent 35 minutes of neurocritical care time  in the care of  this patient. This was time spent independent of any time provided by nurse practitioner or PA.  Roland Rack, MD [facility] [phone]  If 7pm- 7am, please page neurology on call as listed in [place]. 05/27/2020  11:32 AM

## 2020-05-27 NOTE — Progress Notes (Signed)
Pt received bath during this time. Pt received Fentanyl to remain compliant with vent.

## 2020-05-27 NOTE — Progress Notes (Signed)
NAME:  Mary Wilcox, MRN:  [id number], DOB:  Nov 28, 1955, LOS: 9 ADMISSION DATE:  06/04/2020, CONSULTATION DATE:  05/27/2020  REFERRING MD:  Shelly Coss, ED PA  CHIEF COMPLAINT: Altered mental status, seizure  Brief History:  65 year old with ESRD on dialysis, NHR presented with altered mental status and hypertension, had a seizure in the ED requiring intubation for airway protection  Past Medical History:  ESRD on HD T/TH/S PAD Hypothyroidism Cervical cancer 2006 status post radiation  Significant Hospital Events:  3/5 5 admitted with mental status change.  Had witnessed seizure while getting EEG requiring intubation during propofol load and Keppra load 3/6:MRI, pleated showing bilateral posterior cerebral cortical swelling and restricted diffusion fever and complicated posterior reversible encephalopathy syndrome or seizure phenomenon.  Got hypotensive during dialysis required left subclavian triple-lumen catheter placement.  Started on norepinephrine.  Got 1 unit blood. Stopped [place] heparin 3/7: EEG negative for seizure. GCS 3.  Weaning sedating medications.  Getting dialysis.  Weaning norepinephrine still hypotensive. core track for nutrition. got extra treatment of dialysis 3/8: EEG still negative for seizure but showing diffuse slowing.  Grimaces and opens eyes to noxious stimulus but still not following commands.  Failed pressure support attempts.  Sputum sent. Had to be started on Cleviprex as SBP > 160. Stopped acyclovir as HSV was neg. hgb drifted down gm no source of bleeding. Got another unit blood.  3/9 repeat MRI showing worsening PRES. BP better controlled on Cleviprex. Started labetolol VT in effort to wean celeviprex. Neuro marginally better. Actually opened eyes to voice.  3/10 BP has been better since starting Cleviprex and as needed labetalol.  Neuro exam somewhat unchanged from yesterday 3/12 Profound septic shock   Consults:  Renal Neurology  Procedures:  ETT 3/5 >> Lt   CVL 3/6 >>  Significant Diagnostic Tests:  CT angio head/ neck >> stented left subclavian and axillary veins with stent occlusion MRI brain 3/5 >>Bilateral posterior cerebral cortical swelling and restricted diffusion, favor complicated PRES vs seizure LTM EEG 3/6 >> moderate diffuse encephalopathy CSF 3/5 >> normal   Micro Data:  CSF 3/5 >> ng Blood Cx 3/12 >Serratia, Staph aureus   Antimicrobials:  Acyclovir (stopped 3/8) 3/10 Vancomycin > 3/14 3/12 Cefepime > 3/14 3/14 Zosyn > to complete 7 day course  Interim History / Subjective:  Remains intubated, opens eyes to voice commands but unable to follow any other commands. Nursing staff reports that she was able to wiggle her toes yesterday.  Objective   Blood pressure (!) 129/56, pulse 87, temperature 97.6 F (36.4 C), temperature source Axillary, resp. rate (!) 21, height 4\' 11"  (1.499 m), weight 71.7 kg, SpO2 95 %. CVP:  [6 mmHg-17 mmHg] 6 mmHg  Vent Mode: PCV FiO2 (%):  [30 %] 30 % Set Rate:  [20 bmp] 20 bmp PEEP:  [5 cmH20] 5 cmH20 Plateau Pressure:  [18 cmH20-21 cmH20] 18 cmH20   Intake/Output Summary (Last 24 hours) at 05/27/2020 5456 Last data filed at 05/27/2020 0602 Gross per 24 hour  Intake 4489.02 ml  Output 3866 ml  Net 623.02 ml   Filed Weights   05/25/20 0500 05/26/20 0433 05/27/20 0446  Weight: 75.1 kg 74.6 kg 71.7 kg    Examination: Constitutional: Critically ill elderly woman, stuporous, does not follow commands Eyes: Opens eyes to voice commands, pupils equal and reactive to light, unable to track, rightward gaze preference  Ears, nose, mouth, and throat: ETT in place  Cardiovascular: RRR, ext lukewarm Respiratory: mechanical breath sounds Gastrointestinal:  soft, +BS, rectal tube in place Skin: No rashes, normal turgor Neurologic: Opens eyes to voice commands briefly, unable to track, pupils sluggish, does not track, withdraws to painful stimuli Psychiatric: RASS 0  Resolved Hospital Problem  list   Hyperkalemia, resolved with urgent dialysis on 3/5 Hypotensive  Acute hyponatremia in setting of seizure-resolved  Assessment & Plan:   #Acute metabolic encephalopathy with seizure in the setting of PRES #Hypertensive emergency on admission  She remains encephalopathic and has not made much progress since yesterday. She withdraws to painful stimuli.  Has been off pressor support -BP remains at goal SBP < 140 -Follow up LTM EEG read -AEDs per neuro, appreciate help -Appreciate palliative medicine assistance with this difficult situation. Daughter Mary Wilcox) is coming to bedside today to visit. If she elects full scope of care, Mary Wilcox will need trach/PEG/LTACH vs. Comfort  #3/12 Profound septic shock 2/2 Staph aureus and Serratia bacteremia She has remained afebrile and leukocytosis has mildly improved though there is confounding underlying demargination from stress dose steroids -Discontinue vancomycin and cefepime and switch to Zosyn to complete a 7-day course -Discontinue stress dose steroids -Follow-up daily CBC -Follow-up fever curve  #Acute respiratory failure in the setting of ineffective airway protection with seizure -Continue PC with above goal LTVV (not in ARDS and greatly reduces WOB in this mode)  -Will need trach depending on GOC  #ESRD- likely needs to resume CVVH, would keep even to positive #Hypercalcemia-improving with zero calcium bath during CRRT  #Anemia-multifactorial from volume expansion, critical illness, anemia of chronic disease Hemoglobin this morning improved to 9.4 after 2 unit PRBC transfusion.  There has been no reports of bleeding -Follow daily CBC  Best practice (evaluated daily)  Diet: TF Pain/Anxiety/Delirium protocol (if indicated): Fentanyl VAP protocol (if indicated): Y DVT prophylaxis: SQ heparin GI prophylaxis: protonix Glucose control: SSI Mobility: Bed rest Disposition:ICU  Goals of Care:  Last date of multidisciplinary  goals of care discussion: Palliative able to reach [facility] on 3/12 and Mary Wilcox is now DNR.  Full GOC yet to be determined.  She will need trach/PEG/LTACH w/ iHD if all aggressive care is to be pursued   Nathanial Rancher, MD  PGY-3 Internal Medicine Teaching Service

## 2020-05-28 DIAGNOSIS — R569 Unspecified convulsions: Secondary | ICD-10-CM | POA: Diagnosis not present

## 2020-05-28 DIAGNOSIS — N186 End stage renal disease: Secondary | ICD-10-CM | POA: Diagnosis not present

## 2020-05-28 DIAGNOSIS — J9601 Acute respiratory failure with hypoxia: Secondary | ICD-10-CM

## 2020-05-28 DIAGNOSIS — Z9911 Dependence on respirator [ventilator] status: Secondary | ICD-10-CM

## 2020-05-28 DIAGNOSIS — I6783 Posterior reversible encephalopathy syndrome: Secondary | ICD-10-CM | POA: Diagnosis not present

## 2020-05-28 DIAGNOSIS — Z7189 Other specified counseling: Secondary | ICD-10-CM

## 2020-05-28 LAB — RENAL FUNCTION PANEL
Albumin: 2.4 g/dL — ABNORMAL LOW (ref 3.5–5.0)
Albumin: 2.3 g/dL — ABNORMAL LOW (ref 3.5–5.0)
Anion gap: 7 (ref 5–15)
Anion gap: 9 (ref 5–15)
BUN: 29 mg/dL — ABNORMAL HIGH (ref 8–23)
BUN: 26 mg/dL — ABNORMAL HIGH (ref 8–23)
CO2: 26 mmol/L (ref 22–32)
CO2: 23 mmol/L (ref 22–32)
Calcium: 10.9 mg/dL — ABNORMAL HIGH (ref 8.9–10.3)
Calcium: 11.9 mg/dL — ABNORMAL HIGH (ref 8.9–10.3)
Chloride: 106 mmol/L (ref 98–111)
Chloride: 110 mmol/L (ref 98–111)
Creatinine, Ser: 1.35 mg/dL — ABNORMAL HIGH (ref 0.44–1.00)
Creatinine, Ser: 1.34 mg/dL — ABNORMAL HIGH (ref 0.44–1.00)
GFR, Estimated: 43 mL/min — ABNORMAL LOW
GFR, Estimated: 44 mL/min — ABNORMAL LOW (ref >60)
Glucose, Bld: 136 mg/dL — ABNORMAL HIGH (ref 70–99)
Glucose, Bld: 123 mg/dL — ABNORMAL HIGH (ref 70–99)
Phosphorus: 4 mg/dL (ref 2.5–4.6)
Phosphorus: 4.4 mg/dL (ref 2.5–4.6)
Potassium: 3.7 mmol/L (ref 3.5–5.1)
Potassium: 4.4 mmol/L (ref 3.5–5.1)
Sodium: 139 mmol/L (ref 135–145)
Sodium: 142 mmol/L (ref 135–145)

## 2020-05-28 LAB — CULTURE, RESPIRATORY W GRAM STAIN

## 2020-05-28 LAB — GLUCOSE, CAPILLARY
Glucose-Capillary: 116 mg/dL — ABNORMAL HIGH (ref 70–99)
Glucose-Capillary: 116 mg/dL — ABNORMAL HIGH (ref 70–99)
Glucose-Capillary: 117 mg/dL — ABNORMAL HIGH (ref 70–99)
Glucose-Capillary: 124 mg/dL — ABNORMAL HIGH (ref 70–99)
Glucose-Capillary: 78 mg/dL (ref 70–99)
Glucose-Capillary: 82 mg/dL (ref 70–99)

## 2020-05-28 LAB — CALCIUM
Calcium: 10.7 mg/dL — ABNORMAL HIGH (ref 8.9–10.3)
Calcium: 10.9 mg/dL — ABNORMAL HIGH (ref 8.9–10.3)
Calcium: 11.1 mg/dL — ABNORMAL HIGH (ref 8.9–10.3)
Calcium: 11.3 mg/dL — ABNORMAL HIGH (ref 8.9–10.3)
Calcium: 11.4 mg/dL — ABNORMAL HIGH (ref 8.9–10.3)
Calcium: 10.4 mg/dL — ABNORMAL HIGH (ref 8.9–10.3)
Calcium: 10.7 mg/dL — ABNORMAL HIGH (ref 8.9–10.3)

## 2020-05-28 LAB — HEPATIC FUNCTION PANEL
ALT: 1064 U/L — ABNORMAL HIGH (ref 0–44)
AST: 287 U/L — ABNORMAL HIGH (ref 15–41)
Albumin: 2.4 g/dL — ABNORMAL LOW (ref 3.5–5.0)
Alkaline Phosphatase: 125 U/L (ref 38–126)
Bilirubin, Direct: 0.2 mg/dL (ref 0.0–0.2)
Indirect Bilirubin: 0.8 mg/dL (ref 0.3–0.9)
Total Bilirubin: 1 mg/dL (ref 0.3–1.2)
Total Protein: 5.6 g/dL — ABNORMAL LOW (ref 6.5–8.1)

## 2020-05-28 LAB — CBC
HCT: 28.9 % — ABNORMAL LOW (ref 36.0–46.0)
Hemoglobin: 9.8 g/dL — ABNORMAL LOW (ref 12.0–15.0)
MCH: 30.5 pg (ref 26.0–34.0)
MCHC: 33.9 g/dL (ref 30.0–36.0)
MCV: 90 fL (ref 80.0–100.0)
Platelets: 170 10*3/uL (ref 150–400)
RBC: 3.21 MIL/uL — ABNORMAL LOW (ref 3.87–5.11)
RDW: 17.2 % — ABNORMAL HIGH (ref 11.5–15.5)
WBC: 34.9 10*3/uL — ABNORMAL HIGH (ref 4.0–10.5)
nRBC: 5.2 % — ABNORMAL HIGH (ref 0.0–0.2)

## 2020-05-28 LAB — AMMONIA: Ammonia: 24 umol/L (ref 9–35)

## 2020-05-28 LAB — APTT: aPTT: 44 s — ABNORMAL HIGH (ref 24–36)

## 2020-05-28 LAB — VITAMIN B12: Vitamin B-12: 5342 pg/mL — ABNORMAL HIGH (ref 180–914)

## 2020-05-28 LAB — MAGNESIUM: Magnesium: 2.7 mg/dL — ABNORMAL HIGH (ref 1.7–2.4)

## 2020-05-28 MED ORDER — DEXTROSE 50 % IV SOLN
INTRAVENOUS | Status: AC
  Filled 2020-05-28: qty 50

## 2020-05-28 MED ORDER — SODIUM CHLORIDE 0.9 % IV SOLN
2.0000 g | INTRAVENOUS | Status: DC
  Administered 2020-05-28 – 2020-05-30 (×3): 2 g via INTRAVENOUS
  Filled 2020-05-28: qty 20
  Filled 2020-05-28: qty 2
  Filled 2020-05-28 (×2): qty 20

## 2020-05-28 MED ORDER — PRISMASOL BGK 4/2.5 32-4-2.5 MEQ/L REPLACEMENT SOLN: Status: DC

## 2020-05-28 MED ORDER — DARBEPOETIN ALFA 25 MCG/0.42ML IJ SOSY
25.0000 ug | PREFILLED_SYRINGE | INTRAMUSCULAR | Status: DC
  Administered 2020-05-28: 25 ug via INTRAVENOUS
  Filled 2020-05-28: qty 0.42

## 2020-05-28 NOTE — Progress Notes (Signed)
NAME:  Mary Wilcox, MRN:  [id number], DOB:  February 02, 1956, LOS: 70 ADMISSION DATE:  05/27/2020, CONSULTATION DATE:  05/28/2020  REFERRING MD:  Mary Wilcox, ED PA  CHIEF COMPLAINT: Altered mental status, seizure  Brief History:  65 year old with ESRD on dialysis, NHR presented with altered mental status and hypertension, had a seizure in the ED requiring intubation for airway protection  Past Medical History:  ESRD on HD T/TH/S PAD Hypothyroidism Cervical cancer 2006 status post radiation  Significant Wilcox Events:  3/5 5 admitted with mental status change.  Had witnessed seizure while getting EEG requiring intubation during propofol load and Keppra load 3/6:MRI, pleated showing bilateral posterior cerebral cortical swelling and restricted diffusion fever and complicated posterior reversible encephalopathy syndrome or seizure phenomenon.  Got hypotensive during dialysis required left subclavian triple-lumen catheter placement.  Started on norepinephrine.  Got 1 unit blood. Stopped [place] heparin 3/7: EEG negative for seizure. GCS 3.  Weaning sedating medications.  Getting dialysis.  Weaning norepinephrine still hypotensive. core track for nutrition. got extra treatment of dialysis 3/8: EEG still negative for seizure but showing diffuse slowing.  Grimaces and opens eyes to noxious stimulus but still not following commands.  Failed pressure support attempts.  Sputum sent. Had to be started on Cleviprex as SBP > 160. Stopped acyclovir as HSV was neg. hgb drifted down gm no source of bleeding. Got another unit blood.  3/9 repeat MRI showing worsening PRES. BP better controlled on Cleviprex. Started labetolol VT in effort to wean celeviprex. Neuro marginally better. Actually opened eyes to voice.  3/10 BP has been better since starting Cleviprex and as needed labetalol.  Neuro exam somewhat unchanged from yesterday 3/12 Profound septic shock   Consults:  Renal Neurology  Procedures:  ETT 3/5 >> Lt   CVL 3/6 >>  Significant Diagnostic Tests:  CT angio head/ neck >> stented left subclavian and axillary veins with stent occlusion MRI brain 3/5 >>Bilateral posterior cerebral cortical swelling and restricted diffusion, favor complicated PRES vs seizure LTM EEG 3/6 >> moderate diffuse encephalopathy CSF 3/5 >> normal   Micro Data:  CSF 3/5 >> ng Sputum Cx 3/12 >Serratia, Staph aureus   Antimicrobials:  Acyclovir (stopped 3/8) 3/10 Vancomycin > 3/14 3/12 Cefepime > 3/14 3/14 Zosyn > 3/14 3/14 CTX >   Interim History / Subjective:  Neurologically, there have been some subtle improvement as she has moments where she is able to wiggle her toes.  Objective   Blood pressure 125/65, pulse 93, temperature 98.5 F (36.9 C), temperature source Axillary, resp. rate 20, height 4\' 11"  (1.499 m), weight 72.2 kg, SpO2 96 %. CVP:  [2 mmHg-7 mmHg] 5 mmHg  Vent Mode: PCV FiO2 (%):  [30 %] 30 % Set Rate:  [20 bmp] 20 bmp PEEP:  [5 cmH20] 5 cmH20 Plateau Pressure:  [16 cmH20-19 cmH20] 16 cmH20   Intake/Output Summary (Last 24 hours) at 05/28/2020 0630 Last data filed at 05/28/2020 0600 Gross per 24 hour  Intake 2169.4 ml  Output 2976 ml  Net -806.6 ml   Filed Weights   05/26/20 0433 05/27/20 0446 05/28/20 0405  Weight: 74.6 kg 71.7 kg 72.2 kg    Examination: Constitutional: Elderly appearing woman, appears comfortable on the ventilator Eyes: Open eyes to her name, unable to track eyes, pupils equal and reactive to light Ears, nose, mouth, and throat: ETT in place  Cardiovascular: RRR, extremities warm to touch Respiratory: Breath sounds present, no wheezes, crackles, rhonchi Gastrointestinal: soft, +BS, rectal tube in place with  less stool output today than previously Skin: No rashes, normal turgor Neurologic: Grimaces to painful stimuli especially at the chest, flexion/withdrawal to painful stimuli at all 4 extremities, still continues to remain stuporous  Resolved Wilcox  Problem list   Hyperkalemia, resolved with urgent dialysis on 3/5 Hypotensive  Acute hyponatremia in setting of seizure-resolved  Assessment & Plan:   #Acute metabolic encephalopathy with seizure in the setting of PRES in addition to hypoxic-ischemic injury secondary to hypotension  #Hypertensive emergency on admission  Neurologically, there have been some subtle intermittent changes were presently, she is able to wiggle her toes however does not follow any commands. -BP remains at goal SBP < 140 -AEDs per neuro, appreciate assistance -Appreciate palliative medicine assistance with this difficult situation.  Several unsuccessful attempts to reach daughter but will continue the efforts to reach out to her to discuss trach/PEG/LTACH vs. Comfort -Follow-up ammonia  #3/12 Profound septic shock 2/2 MSSA and Serratia HCAP Has remained afebrile.  She continues to tolerate pressure support ventilation without any difficulties.  Continue ceftriaxone for 5 doses -Follow-up daily CBC -Follow-up fever curve -Zosyn has been switched to ceftriaxone   #Diarrhea Her stool output has decreased from yesterday.  Unable to ascertain if she has any abdominal pain.  Presently, does not appear to be in shock and thus will hold off obtaining C. difficile for now.  #Acute respiratory failure in the setting of ineffective airway protection with seizure -Continue PC with above goal LTVV (not in ARDS and greatly reduces WOB in this mode)  -Will need trach depending on GOC  #ESRD-continue CRRT for now.  Discontinue sodium bicarb #Hypercalcemia-improving with CRRT  #Anemia-multifactorial from volume expansion, critical illness, anemia of chronic disease Stable -Follow daily CBC  Best practice (evaluated daily)  Diet: TF Pain/Anxiety/Delirium protocol (if indicated): Fentanyl VAP protocol (if indicated): Y DVT prophylaxis: SQ heparin GI prophylaxis: protonix Glucose control: SSI Mobility: Bed  rest Disposition:ICU  Goals of Care:  Last date of multidisciplinary goals of care discussion: Palliative able to reach Mary Wilcox on 3/12 and Mary Wilcox is now DNR.  Full GOC yet to be determined.  She will need trach/PEG/LTACH w/ iHD if all aggressive care is to be pursued   Nathanial Rancher, MD  PGY-3 Internal Medicine Teaching Service

## 2020-05-28 NOTE — Progress Notes (Signed)
[facility] ROUNDING NOTE   Subjective:   Brief history: 65 year old lady with a history of end-stage renal disease dialysis dependent   she has been admitted since 06/10/2020.  She has a past history of hypothyroidism peripheral vascular disease, cervical cancer 2006.  In the emergency room she developed new onset seizures with acute encephalopathy and hypertensive urgency.  EEG confirmed a large frontal seizure.  MRI was concerning for PRES.  Patient is usually dialyzed Tuesday Thursday Saturday [facility] and has been transitioned to CRRT 05/22/2020.  She continues on CRRT.  Appreciate assistance from palliative Medicine.   She has large diarrhea stools.  She received a blood transfusion 05/21/2020 and 05/26/2020  Blood pressure 1 25-65 pulse 95 temperature 98.5 O2 sats 96% FiO2 30%    Sodium 139 potassium 3.7 chloride 106 CO2 26 BUN 29 creatinine 1.34 glucose 136 calcium 10.9 phosphorus 4 magnesium 2.7 albumin 2.4 AST 287 ALT 1064 total bilirubin 0.2 WBC 34.9 hemoglobin 9.8 platelets 170   Objective:  Vital signs in last 24 hours:  Temp:  [98.4 F (36.9 C)-99.6 F (37.6 C)] 98.5 F (36.9 C) (03/15 0400) Pulse Rate:  [84-101] 93 (03/15 0605) Resp:  [15-28] 20 (03/15 0605) BP: (113-163)/(54-99) 125/65 (03/15 0605) SpO2:  [95 %-100 %] 96 % (03/15 0605) FiO2 (%):  [30 %] 30 % (03/15 0400) Weight:  [72.2 kg] 72.2 kg (03/15 0405)  Weight change: 0.5 kg Filed Weights   05/26/20 0433 05/27/20 0446 05/28/20 0405  Weight: 74.6 kg 71.7 kg 72.2 kg    Intake/Output: I/O last 3 completed shifts: In: 5518.6 [I.V.:876.9; Blood:1672.1; NG/GT:2010; IV Piggyback:959.7] Out: 8182 [Other:542; XHBZJ:6967]   Intake/Output this shift:  Total I/O In: 1049.9 [I.V.:399.9; NG/GT:550; IV Piggyback:100] Out: 1150 [Stool:1150]  Constitutional: Critically ill elderly woman Eyes: Opens eyes to voice commands, pupils equal and reactive to light, unable to track, rightward gaze  preference  Ears, nose, mouth, and throat: ETT in place  Cardiovascular: RRR, ext lukewarm Respiratory: mechanical breath sounds Gastrointestinal: soft, +BS, rectal tube in place Skin: No rashes, normal turgor Neurologic: Opens eyes to voice commands briefly, unable to track, pupils sluggish, does not track, withdraws to painful stimuli   Basic Metabolic Panel: Recent Labs  Lab 05/24/20 0349 05/24/20 1726 05/25/20 0544 05/25/20 0757 05/26/20 0407 05/26/20 0825 05/26/20 1230 05/26/20 1540 05/26/20 2114 05/27/20 0433 05/27/20 0435 05/27/20 0939 05/27/20 1537 05/27/20 1857 05/27/20 2306 05/28/20 0306  NA 136   < > 137   < > 136  --   --  140  --   --  141  --  140  --   --  139  K 4.8   < > 5.1   < > 5.4*  --   --  4.4  --   --  4.3  --  4.0  --   --  3.7  CL 101   < > 101  --  107  --   --  109  --   --  107  --  107  --   --  106  CO2 27   < > 23  --  16*  --   --  17*  --   --  22  --  24  --   --  26  GLUCOSE 153*   < > 176*  --  199*  --   --  201*  --   --  167*  --  153*  --   --  136*  BUN 29*   < > 22  --  60*  --   --  58*  --   --  40*  --  33*  --   --  29*  CREATININE 1.59*   < > 1.12*  --  3.00*  --   --  2.83*  --   --  1.77*  --  1.50*  --   --  1.35*  CALCIUM 10.7*   < > 11.6*  --  13.3*  --    < > 11.9*  11.6*   < > 11.3* 11.5*   < > 11.4* 10.9* 10.7* 10.9*  10.9*  MG 2.6*  --  2.7*  --   --  2.9*  --   --   --  2.7*  --   --   --   --   --  2.7*  PHOS 3.9   < > 4.1  --   --  5.4*  --  4.5  --   --  4.0  --  4.1  --   --  4.0   < > = values in this interval not displayed.    Liver Function Tests: Recent Labs  Lab 05/25/20 0810 05/26/20 0407 05/26/20 1540 05/27/20 0433 05/27/20 0435 05/27/20 1537 05/28/20 0306  AST 1,580* 2,008*  --  575*  --   --  287*  ALT 1,244* 2,116*  --  1,439*  --   --  1,064*  ALKPHOS 84 81  --  97  --   --  125  BILITOT 1.1 1.0  --  1.2  --   --  1.0  PROT 6.1* 6.3*  --  6.0*  --   --  5.6*  ALBUMIN 2.4* 3.6 3.0* 2.6*  2.6* 2.5* 2.4*  2.4*   No results for input(s): LIPASE, AMYLASE in the last 168 hours. No results for input(s): AMMONIA in the last 168 hours.  CBC: Recent Labs  Lab 05/24/20 0349 05/25/20 0757 05/25/20 0810 05/26/20 0407 05/26/20 0539 05/26/20 2114 05/27/20 0433 05/28/20 0306  WBC 11.5*  --  26.4* 41.9*  --   --  38.1* 34.9*  HGB 8.2*   < > 8.6* 6.6* 6.2* 9.8* 9.4* 9.8*  HCT 25.3*   < > 25.6* 19.8* 18.6* 28.4* 27.5* 28.9*  MCV 91.7  --  90.1 90.8  --   --  87.6 90.0  PLT 213  --  200 187  --   --  149* 170   < > = values in this interval not displayed.    Cardiac Enzymes: No results for input(s): CKTOTAL, CKMB, CKMBINDEX, TROPONINI in the last 168 hours.  BNP: Invalid input(s): POCBNP  CBG: Recent Labs  Lab 05/27/20 1137 05/27/20 1545 05/27/20 1935 05/27/20 2310 05/28/20 0310  GLUCAP 127* 135* 114* 125* 116*    Microbiology: Results for orders placed or performed during the hospital encounter of 05/29/2020  Resp Panel by RT-PCR (Flu A&B, Covid) Nasopharyngeal Swab     Status: None   Collection Time: 06/09/2020 12:44 PM   Specimen: Nasopharyngeal Swab; Nasopharyngeal(NP) swabs in vial transport medium  Result Value Ref Range Status   SARS Coronavirus 2 by RT PCR NEGATIVE NEGATIVE Final    Comment: (NOTE) SARS-CoV-2 target nucleic acids are NOT DETECTED.  The SARS-CoV-2 RNA is generally detectable in upper respiratory specimens during the acute phase of infection. The lowest concentration of SARS-CoV-2 viral copies this assay can detect is 138 copies/mL. A negative  result does not preclude SARS-Cov-2 infection and should not be used as the sole basis for treatment or other patient management decisions. A negative result may occur with  improper specimen collection/handling, submission of specimen other than nasopharyngeal swab, presence of viral mutation(s) within the areas targeted by this assay, and inadequate number of viral copies(<138 copies/mL). A  negative result must be combined with clinical observations, patient history, and epidemiological information. The expected result is Negative.  Fact Sheet for Patients:  EntrepreneurPulse.com.au  Fact Sheet for Healthcare Providers:  IncredibleEmployment.be  This test is no t yet approved or cleared by the [place] FDA and  has been authorized for detection and/or diagnosis of SARS-CoV-2 by FDA under an Emergency Use Authorization (EUA). This EUA will remain  in effect (meaning this test can be used) for the duration of the COVID-19 declaration under Section 564(b)(1) of the Act, 21 U.S.C.section 360bbb-3(b)(1), unless the authorization is terminated  or revoked sooner.       Influenza A by PCR NEGATIVE NEGATIVE Final   Influenza B by PCR NEGATIVE NEGATIVE Final    Comment: (NOTE) The Xpert Xpress SARS-CoV-2/FLU/RSV plus assay is intended as an aid in the diagnosis of influenza from Nasopharyngeal swab specimens and should not be used as a sole basis for treatment. Nasal washings and aspirates are unacceptable for Xpert Xpress SARS-CoV-2/FLU/RSV testing.  Fact Sheet for Patients: EntrepreneurPulse.com.au  Fact Sheet for Healthcare Providers: IncredibleEmployment.be  This test is not yet approved or cleared by the [place] FDA and has been authorized for detection and/or diagnosis of SARS-CoV-2 by FDA under an Emergency Use Authorization (EUA). This EUA will remain in effect (meaning this test can be used) for the duration of the COVID-19 declaration under Section 564(b)(1) of the Act, 21 U.S.C. section 360bbb-3(b)(1), unless the authorization is terminated or revoked.  Performed at [facility], [place] [address] [place], [place] [address]   CSF culture w Gram Stain     Status: None   Collection Time: 05/16/2020  5:34 PM   Specimen: PATH Cytology CSF; Cerebrospinal Fluid  Result Value  Ref Range Status   Specimen Description CSF  Final   Special Requests NONE  Final   Gram Stain   Final    WBC PRESENT,BOTH PMN AND MONONUCLEAR NO ORGANISMS SEEN CYTOSPIN SMEAR    Culture   Final    NO GROWTH 3 DAYS CORRECTED ON 03/14 AT 0941: PREVIOUSLY REPORTED AS NO GROWTH Performed at [facility], [place] [address], [place], [place] [address]    Report Status 05/27/2020 FINAL  Final  MRSA PCR Screening     Status: None   Collection Time: 05/19/20  1:49 AM   Specimen: Nasopharyngeal  Result Value Ref Range Status   MRSA by PCR NEGATIVE NEGATIVE Final    Comment:        The GeneXpert MRSA Assay (FDA approved for NASAL specimens only), is one component of a comprehensive MRSA colonization surveillance program. It is not intended to diagnose MRSA infection nor to guide or monitor treatment for MRSA infections. Performed at [facility], [place] [address] [place], [place] [address]   Culture, Respiratory w Gram Stain     Status: None   Collection Time: 05/21/20 12:17 PM   Specimen: Tracheal Aspirate; Respiratory  Result Value Ref Range Status   Specimen Description TRACHEAL ASPIRATE  Final   Special Requests NONE  Final   Gram Stain   Final    ABUNDANT WBC  PRESENT, PREDOMINANTLY PMN ABUNDANT GRAM POSITIVE COCCI IN PAIRS FEW GRAM NEGATIVE COCCI FEW GRAM NEGATIVE RODS RARE GRAM POSITIVE RODS    Culture   Final    ABUNDANT STAPHYLOCOCCUS AUREUS WITHIN NORMAL RESPIRARTIRY FLORA Performed at [facility], [place] [address], [place], [place] [address]    Report Status 05/24/2020 FINAL  Final   Organism ID, Bacteria STAPHYLOCOCCUS AUREUS  Final      Susceptibility   Staphylococcus aureus - MIC*    CIPROFLOXACIN <=0.5 SENSITIVE Sensitive     ERYTHROMYCIN <=0.25 SENSITIVE Sensitive     GENTAMICIN <=0.5 SENSITIVE Sensitive     OXACILLIN 0.5 SENSITIVE Sensitive     TETRACYCLINE <=1 SENSITIVE Sensitive     VANCOMYCIN <=0.5 SENSITIVE Sensitive     TRIMETH/SULFA  <=10 SENSITIVE Sensitive     CLINDAMYCIN <=0.25 SENSITIVE Sensitive     RIFAMPIN <=0.5 SENSITIVE Sensitive     Inducible Clindamycin NEGATIVE Sensitive     * ABUNDANT STAPHYLOCOCCUS AUREUS  Culture, blood (routine x 2)     Status: None (Preliminary result)   Collection Time: 05/25/20  8:00 AM   Specimen: BLOOD LEFT HAND  Result Value Ref Range Status   Specimen Description BLOOD LEFT HAND  Final   Special Requests   Final    BOTTLES DRAWN AEROBIC ONLY Blood Culture results may not be optimal due to an inadequate volume of blood received in culture bottles   Culture   Final    NO GROWTH 2 DAYS Performed at [facility], [place] [address] [place], [place] [address]    Report Status PENDING  Incomplete  Culture, blood (routine x 2)     Status: None (Preliminary result)   Collection Time: 05/25/20  8:15 AM   Specimen: BLOOD LEFT HAND  Result Value Ref Range Status   Specimen Description BLOOD LEFT HAND  Final   Special Requests   Final    BOTTLES DRAWN AEROBIC ONLY Blood Culture results may not be optimal due to an inadequate volume of blood received in culture bottles   Culture   Final    NO GROWTH 2 DAYS Performed at [facility], [place] [address] [place], [place] [address]    Report Status PENDING  Incomplete  Culture, Respiratory w Gram Stain     Status: None (Preliminary result)   Collection Time: 05/25/20 10:47 AM   Specimen: Tracheal Aspirate; Respiratory  Result Value Ref Range Status   Specimen Description TRACHEAL ASPIRATE  Final   Special Requests NONE  Final   Gram Stain   Final    ABUNDANT WBC PRESENT,BOTH PMN AND MONONUCLEAR ABUNDANT GRAM NEGATIVE RODS MODERATE GRAM POSITIVE COCCI IN PAIRS Performed at [facility], [place] [address], [place], [place] [address]    Culture   Final    ABUNDANT SERRATIA MARCESCENS ABUNDANT STAPHYLOCOCCUS AUREUS    Report Status PENDING  Incomplete   Organism ID, Bacteria SERRATIA MARCESCENS  Final      Susceptibility    Serratia marcescens - MIC*    CEFAZOLIN >=64 RESISTANT Resistant     CEFEPIME <=0.12 SENSITIVE Sensitive     CEFTAZIDIME <=1 SENSITIVE Sensitive     CEFTRIAXONE <=0.25 SENSITIVE Sensitive     CIPROFLOXACIN <=0.25 SENSITIVE Sensitive     GENTAMICIN <=1 SENSITIVE Sensitive     TRIMETH/SULFA <=20 SENSITIVE Sensitive     * ABUNDANT SERRATIA MARCESCENS    Coagulation Studies: Recent Labs    05/25/20 0810 05/26/20 0407  LABPROT 17.7*  21.1*  INR 1.5* 1.9*    Urinalysis: No results for input(s): COLORURINE, LABSPEC, PHURINE, GLUCOSEU, HGBUR, BILIRUBINUR, KETONESUR, PROTEINUR, UROBILINOGEN, NITRITE, LEUKOCYTESUR in the last 72 hours.  Invalid input(s): APPERANCEUR    Imaging: No results found.   Medications:   .  prismasol BGK 4/2.5 400 mL/hr at 05/28/20 0121  . sodium chloride 20 mL/hr at 05/28/20 0606  . sodium chloride    . sodium chloride 250 mL (05/25/20 0800)  . cefTRIAXone (ROCEPHIN)  IV Stopped (05/27/20 1831)  . feeding supplement (VITAL 1.5 CAL) 1,000 mL (05/27/20 1438)  . fentaNYL infusion INTRAVENOUS Stopped (05/27/20 0548)  . heparin 10,000 units/ 20 mL infusion syringe 600 Units/hr (05/27/20 2314)  . levETIRAcetam Stopped (05/27/20 2125)  . norepinephrine (LEVOPHED) Adult infusion Stopped (05/26/20 1256)  . prismasol B22GK 4/0 1,800 mL/hr at 05/28/20 4287  . sodium bicarbonate (isotonic) 1000 mL infusion 100 mL/hr at 05/27/20 1550  . vasopressin Stopped (05/26/20 0733)   . chlorhexidine gluconate (MEDLINE KIT)  15 mL Mouth Rinse BID  . Chlorhexidine Gluconate Cloth  6 each Topical Daily  . docusate  100 mg Per Tube BID  . feeding supplement (PROSource TF)  45 mL Per Tube QID  . fentaNYL (SUBLIMAZE) injection  25 mcg Intravenous Once  . insulin aspart  0-6 Units Subcutaneous Q4H  . mouth rinse  15 mL Mouth Rinse 10 times per day  . multivitamin  1 tablet Per Tube QHS  . nystatin  5 mL Mouth/Throat QID  . pantoprazole sodium  40 mg Per Tube Q1200  .  sodium chloride flush  10-40 mL Intracatheter Q12H   acetaminophen (TYLENOL) oral liquid 160 mg/5 mL, alteplase, fentaNYL, fentaNYL (SUBLIMAZE) injection, heparin, heparin, labetalol, polyethylene glycol, sodium chloride, sodium chloride flush  Assessment/ Plan:  OP HD: TTS AF  3h 48mn  75.5kg  2/2 bath Hep 7400  RIJ TDC Mircera 60 MCGq2wks last given 05/07/20  Venofer 50 weekly  Assessment/Plan 1. Shock / hypothermia - stabilizing improved blood pressures 2. HTN crisis/ PRES by MRI/ seizures: hx of missing HD/ chronic vol overload/ poor compliance. 2nd MRI 3/9+ worsening PRES. Pt was transitioned from iHD to CRRT 3/9 to avoid the large volume shifts of regular HD. BP's better now and off cleviprex.   3. Hypercalcemia - severe up to 13 today. Will use zero Ca++ bath w/ q 4 hr Ca labs, may need pamidronate as well. Suspect due to immobilization.   Calcium seems to be slowly improving. 4. ESRD - on HD since 2012. HD TTS.  CRRT started on 3/09.   continue CRRT 5. Vol overload: Patient being kept even on CRRT 6. Anemia of ESRD -Hgb 8's.  SP darbe 150ug on 3/08 here.  Will resume ESA. 7. Metabolic bone disease -hypercalcemia have discontinued vitamin D and calcium acetate therapy.  0 calcium bath with CRRT at this time.  Close monitoring of calcium 8. Nutrition -ALB 3.3,currently n.p.o. 9. [facility]  10. GOC - seen by palliative care. Given SNF dependence, hx of refusal of care and ++sig comorbidities would favor transition to comfort care.      LOS: 1[place]_0 _1 :48 AM

## 2020-05-28 NOTE — Progress Notes (Signed)
Palliative:  HPI: 65 y.o.femalewith past medical history of ESRD on HD, PVD, cervical cancer, chronic hypotension on midodrinewho was admitted on 3/5/2022with altered mental status and seizures. She was intubated for airway protection and diagnosed with PRES based on MRI findings. She was switched from IHD to CRRT in order to gain tighter control over her blood pressure. Repeat MRI on 3/9 showed worsening PRES.  Early this morning (3/12) the patient's blood pressure dropped precipitously despite receiving bolus fluid, albumin and being on levophed.   I met today at Mary Wilcox' bedside but no family/visitors present. Discussed with RN who reports no family noted to visit today or yesterday. I was unable to elicit any reaction from Mary Wilcox today who is unresponsive with only withdrawal to pain but reportedly follows simple commands on occasion.   I called and spoke with daughter, Mary Wilcox. I was able to reach her in the afternoon. Mary Wilcox has good questions regarding her Wilcox's diagnosis, lab work, and expectations. Mary Wilcox shares that she has not been very close to her Wilcox and last spoke to her last summer. Mary Wilcox does report general decline since Mary Wilcox died in 06-03-2017. She has had had progressive noncompliance with dialysis over this course of time as well. She has required facility placement. We discussed concern that Mary Wilcox was having declining quality of life even prior to this acute illness. Mary Wilcox does comment that she questioned if her Wilcox was just tired of having to go to dialysis as she knows this has been taxing on her overtime (HD since 06-04-11).   I discussed further with Mary Wilcox that we are at a crossroads where important and difficult decisions will need to be made on how to move forward. I voice concern over her Wilcox's declining quality of life and there is a real possibility that her current state is as good as it gets for Mary Wilcox. I do not see that  she will ever be able to return to her previous quality of life and even this is being questioned as being acceptable to Mary Wilcox. Mary Wilcox wants to come to bedside tomorrow morning to further discuss how to move forward from here. Heather plans to reach out to other family members to let them know that Mary Wilcox is extremely ill. I encouraged Mary Wilcox to bring other family members with her for additional support if she desires.   All questions/concerns addressed. Emotional support provided.   Exam: Unresponsive. Withdraws to pain. No eye opening. Tolerating vent. CRRT. Extremities cold to touch.   Plan: - Plan to meet daughter, Mary Wilcox, tomorrow morning to discuss comfort vs full scope pathways and expectations.   [place], NP Palliative Medicine Team Pager [phone] (Please see [email or web address] for schedule) Team Phone [phone]    Greater than 50%  of this time was spent counseling and coordinating care related to the above assessment and plan

## 2020-05-28 NOTE — Progress Notes (Signed)
Subjective: No significant changes  Exam: Vitals:   05/28/20 1149 05/28/20 1200  BP:  118/68  Pulse:  100  Resp:  20  Temp: 98.5 F (36.9 C)   SpO2:  100%   Gen: In bed, NAD Resp: non-labored breathing, no acute distress Abd: soft, nt  Neuro: MS: Opens eyes to noxious sitmulation, does not engage the examiner. She does wiggle toes to command a single time, but not reliably.  AG:TXMIW, eyes midline, corneals intact. Unclear blink to threat given frequent blinking Motor: withdrawal vs flexion in all 4 ext to noxious stimulation.  Sensory:grimaces to nox stim in bilateral upper extremities.   Pertinent Results: Ammonia 24  Impression: 65 year old female who presented with PRES who has unfortunately had issues with both sepsis and severely labile blood pressures. She did have an EEG recorded seizure. She has evidence of liver injury secondary to her shock, and I suspect that she has had some degree of hypoxic-ischemic injury as well.  She may have significant recovery over time, but I suspect it will be quite prolonged and possibly incomplete. Given her other medical issues, I think it is reasonable to continue palliative discussions, but from a purely neurological perspective, I would favor continued observation for now.   Recommendations: 1) continue keppra 1g q12h 2) will follow.   Roland Rack, MD [facility] [phone]  If 7pm- 7am, please page neurology on call as listed in [place]. 05/28/2020  12:30 PM

## 2020-05-28 NOTE — Progress Notes (Addendum)
Shift Summary: No significant events  N: follows commands intermittently on BLE, does not follow commands on BUE or with eyes. All extremities withdraw to deep nailbed pain. Pupils 58mm and sluggish. Afebrile. Grimaces to pain. Prn fentanyl given once for evidence of pain with large turn/clean.  R: Remains intubated, RT decreased TV for coughing and desaturation episodes. Ronchi/diminished lung sounds. Scant secretions.  CV: NSR, BP WDL requiring one dose of prn labetalol for SBP goal less than 160.  GI: NGT remains with tube feeds at goal. FMS in place with 1184mL out this shift. BG WDL not requiring SS correction.   GU: no foley, aneuric. CRRT goal +51mL/hr, not pulling fluids. Ending shift ~+220mL. Switched replacement fluid from sodium bicarb to 4k/2.5.   Skin intact, scattered bruising generalized over body. Sarcum intact beneath dressing. No labs of concern.   Daughter not at bedside, plan to visit tomorrow.  Trinitee Horgan RN

## 2020-05-29 DIAGNOSIS — Z7189 Other specified counseling: Secondary | ICD-10-CM | POA: Diagnosis not present

## 2020-05-29 DIAGNOSIS — Z515 Encounter for palliative care: Secondary | ICD-10-CM | POA: Diagnosis not present

## 2020-05-29 DIAGNOSIS — I6783 Posterior reversible encephalopathy syndrome: Secondary | ICD-10-CM | POA: Diagnosis not present

## 2020-05-29 DIAGNOSIS — N186 End stage renal disease: Secondary | ICD-10-CM | POA: Diagnosis not present

## 2020-05-29 DIAGNOSIS — J9601 Acute respiratory failure with hypoxia: Secondary | ICD-10-CM | POA: Diagnosis not present

## 2020-05-29 DIAGNOSIS — R569 Unspecified convulsions: Secondary | ICD-10-CM | POA: Diagnosis not present

## 2020-05-29 LAB — RENAL FUNCTION PANEL
Albumin: 2.1 g/dL — ABNORMAL LOW (ref 3.5–5.0)
Albumin: 2 g/dL — ABNORMAL LOW (ref 3.5–5.0)
Anion gap: 9 (ref 5–15)
Anion gap: 6 (ref 5–15)
BUN: 28 mg/dL — ABNORMAL HIGH (ref 8–23)
BUN: 23 mg/dL (ref 8–23)
CO2: 21 mmol/L — ABNORMAL LOW (ref 22–32)
CO2: 21 mmol/L — ABNORMAL LOW (ref 22–32)
Calcium: 10.4 mg/dL — ABNORMAL HIGH (ref 8.9–10.3)
Calcium: 10 mg/dL (ref 8.9–10.3)
Chloride: 109 mmol/L (ref 98–111)
Chloride: 112 mmol/L — ABNORMAL HIGH (ref 98–111)
Creatinine, Ser: 1.29 mg/dL — ABNORMAL HIGH (ref 0.44–1.00)
Creatinine, Ser: 1.22 mg/dL — ABNORMAL HIGH (ref 0.44–1.00)
GFR, Estimated: 46 mL/min — ABNORMAL LOW (ref >60)
GFR, Estimated: 49 mL/min — ABNORMAL LOW (ref >60)
Glucose, Bld: 142 mg/dL — ABNORMAL HIGH (ref 70–99)
Glucose, Bld: 137 mg/dL — ABNORMAL HIGH (ref 70–99)
Phosphorus: 4.2 mg/dL (ref 2.5–4.6)
Phosphorus: 3.9 mg/dL (ref 2.5–4.6)
Potassium: 4.1 mmol/L (ref 3.5–5.1)
Potassium: 4.2 mmol/L (ref 3.5–5.1)
Sodium: 139 mmol/L (ref 135–145)
Sodium: 139 mmol/L (ref 135–145)

## 2020-05-29 LAB — GLUCOSE, CAPILLARY
Glucose-Capillary: 18 mg/dL — CL (ref 70–99)
Glucose-Capillary: 130 mg/dL — ABNORMAL HIGH (ref 70–99)
Glucose-Capillary: 133 mg/dL — ABNORMAL HIGH (ref 70–99)
Glucose-Capillary: 84 mg/dL (ref 70–99)
Glucose-Capillary: 89 mg/dL (ref 70–99)
Glucose-Capillary: 116 mg/dL — ABNORMAL HIGH (ref 70–99)
Glucose-Capillary: 115 mg/dL — ABNORMAL HIGH (ref 70–99)
Glucose-Capillary: 117 mg/dL — ABNORMAL HIGH (ref 70–99)

## 2020-05-29 LAB — CBC
HCT: 28.2 % — ABNORMAL LOW (ref 36.0–46.0)
Hemoglobin: 9.3 g/dL — ABNORMAL LOW (ref 12.0–15.0)
MCH: 30.4 pg (ref 26.0–34.0)
MCHC: 33 g/dL (ref 30.0–36.0)
MCV: 92.2 fL (ref 80.0–100.0)
Platelets: 205 10*3/uL (ref 150–400)
RBC: 3.06 MIL/uL — ABNORMAL LOW (ref 3.87–5.11)
RDW: 18.2 % — ABNORMAL HIGH (ref 11.5–15.5)
WBC: 24.9 10*3/uL — ABNORMAL HIGH (ref 4.0–10.5)
nRBC: 7.5 % — ABNORMAL HIGH (ref 0.0–0.2)

## 2020-05-29 LAB — CALCIUM
Calcium: 10.8 mg/dL — ABNORMAL HIGH (ref 8.9–10.3)
Calcium: 10.8 mg/dL — ABNORMAL HIGH (ref 8.9–10.3)
Calcium: 9.9 mg/dL (ref 8.9–10.3)
Calcium: 9.9 mg/dL (ref 8.9–10.3)
Calcium: 9.9 mg/dL (ref 8.9–10.3)

## 2020-05-29 LAB — HEPATIC FUNCTION PANEL
ALT: 587 U/L — ABNORMAL HIGH (ref 0–44)
AST: 90 U/L — ABNORMAL HIGH (ref 15–41)
Albumin: 2.1 g/dL — ABNORMAL LOW (ref 3.5–5.0)
Alkaline Phosphatase: 133 U/L — ABNORMAL HIGH (ref 38–126)
Bilirubin, Direct: 0.4 mg/dL — ABNORMAL HIGH (ref 0.0–0.2)
Indirect Bilirubin: 0.7 mg/dL (ref 0.3–0.9)
Total Bilirubin: 1.1 mg/dL (ref 0.3–1.2)
Total Protein: 4.8 g/dL — ABNORMAL LOW (ref 6.5–8.1)

## 2020-05-29 LAB — MAGNESIUM: Magnesium: 2.6 mg/dL — ABNORMAL HIGH (ref 1.7–2.4)

## 2020-05-29 LAB — APTT: aPTT: 40 seconds — ABNORMAL HIGH (ref 24–36)

## 2020-05-29 LAB — TRIGLYCERIDES: Triglycerides: 118 mg/dL (ref <150)

## 2020-05-29 MED ORDER — SODIUM CHLORIDE 0.9 % IV SOLN
750.0000 mg | Freq: Two times a day (BID) | INTRAVENOUS | Status: DC
  Administered 2020-05-29 – 2020-05-31 (×5): 750 mg via INTRAVENOUS
  Filled 2020-05-29 (×6): qty 7.5

## 2020-05-29 NOTE — Progress Notes (Signed)
[facility] ROUNDING NOTE   Subjective:   Brief history: 65 year old lady with a history of end-stage renal disease dialysis dependent   she has been admitted since 05/25/2020.  She has a past history of hypothyroidism peripheral vascular disease, cervical cancer 2006.  In the emergency room she developed new onset seizures with acute encephalopathy and hypertensive urgency.  EEG confirmed a large frontal seizure.  MRI was concerning for PRES.  Patient is usually dialyzed Tuesday Thursday Saturday [facility] and has been transitioned to CRRT 05/22/2020.  She continues on CRRT.  Appreciate assistance from palliative Medicine.   She has large diarrhea stools.  She received a blood transfusion 05/21/2020 and 05/26/2020  Blood pressure 110/68 pulse 109 temperature 98.7 O2 sats 89% 30% FiO2  Sodium 139 potassium 4.1 chloride 109 CO2 21 BUN 28 creatinine 1.29 glucose 142 calcium 10.4 phosphorus 4.2 magnesium 2.7 albumin 2.1 AST 90 ALT 587 hemoglobin 9.3 WBC 24.9   Objective:  Vital signs in last 24 hours:  Temp:  [98.3 F (36.8 C)-98.9 F (37.2 C)] 98.7 F (37.1 C) (03/16 0400) Pulse Rate:  [84-106] 98 (03/16 0600) Resp:  [18-38] 27 (03/16 0600) BP: (101-167)/(61-123) 110/68 (03/16 0600) SpO2:  [95 %-100 %] 100 % (03/16 0600) FiO2 (%):  [30 %] 30 % (03/16 0400) Weight:  [71.2 kg] 71.2 kg (03/16 0500)  Weight change: -1 kg Filed Weights   05/27/20 0446 05/28/20 0405 05/29/20 0500  Weight: 71.7 kg 72.2 kg 71.2 kg    Intake/Output: I/O last 3 completed shifts: In: 3544.5 [I.V.:1014.6; NG/GT:2080; IV Piggyback:449.9] Out: 3557 [Other:15; Stool:4050]   Intake/Output this shift:  Total I/O In: 1359.3 [I.V.:249.3; Other:170; NG/GT:740; IV Piggyback:200] Out: 1225 [Other:275; Stool:950]  Constitutional: Critically ill elderly woman Eyes: Opens eyes to voice commands, pupils equal and reactive to light, unable to track, rightward gaze preference  Ears, nose, mouth, and  throat: ETT in place  Cardiovascular: RRR, ext lukewarm Respiratory: mechanical breath sounds Gastrointestinal: soft, +BS, rectal tube in place Skin: No rashes, normal turgor Neurologic: Opens eyes to voice commands briefly, unable to track, pupils sluggish, does not track, withdraws to painful stimuli   Basic Metabolic Panel: Recent Labs  Lab 05/25/20 0544 05/25/20 0757 05/26/20 0825 05/26/20 1230 05/27/20 0433 05/27/20 0435 05/27/20 0939 05/27/20 1537 05/27/20 1857 05/28/20 0306 05/28/20 0641 05/28/20 1536 05/28/20 1951 05/28/20 2040 05/28/20 2335 05/29/20 0423  NA 137   < >  --    < >  --  141  --  140  --  139  --  142  --   --   --  139  K 5.1   < >  --    < >  --  4.3  --  4.0  --  3.7  --  4.4  --   --   --  4.1  CL 101   < >  --    < >  --  107  --  107  --  106  --  110  --   --   --  109  CO2 23   < >  --    < >  --  22  --  24  --  26  --  23  --   --   --  21*  GLUCOSE 176*   < >  --    < >  --  167*  --  153*  --  136*  --  123*  --   --   --  142*  BUN 22   < >  --    < >  --  40*  --  33*  --  29*  --  26*  --   --   --  28*  CREATININE 1.12*   < >  --    < >  --  1.77*  --  1.50*  --  1.35*  --  1.34*  --   --   --  1.29*  CALCIUM 11.6*   < >  --    < > 11.3* 11.5*   < > 11.4*   < > 10.9*   10.9*   < > 11.9*   11.4*   < > 10.7* 10.8* 10.4*  MG 2.7*  --  2.9*  --  2.7*  --   --   --   --  2.7*  --   --   --   --   --  2.6*  PHOS 4.1  --  5.4*   < >  --  4.0  --  4.1  --  4.0  --  4.4  --   --   --  4.2   < > = values in this interval not displayed.    Liver Function Tests: Recent Labs  Lab 05/25/20 0810 05/26/20 0407 05/26/20 1540 05/27/20 0433 05/27/20 0435 05/27/20 1537 05/28/20 0306 05/28/20 1536 05/29/20 0423  AST 1,580* 2,008*  --  575*  --   --  287*  --  90*  ALT 1,244* 2,116*  --  1,439*  --   --  1,064*  --  587*  ALKPHOS 84 81  --  97  --   --  125  --  133*  BILITOT 1.1 1.0  --  1.2  --   --  1.0  --  1.1  PROT 6.1* 6.3*  --  6.0*  --    --  5.6*  --  4.8*  ALBUMIN 2.4* 3.6   < > 2.6* 2.6* 2.5* 2.4*   2.4* 2.3* 2.1*   2.1*   < > = values in this interval not displayed.   No results for input(s): LIPASE, AMYLASE in the last 168 hours. Recent Labs  Lab 05/28/20 0812  AMMONIA 24    CBC: Recent Labs  Lab 05/25/20 0810 05/26/20 0407 05/26/20 0539 05/26/20 2114 05/27/20 0433 05/28/20 0306 05/29/20 0423  WBC 26.4* 41.9*  --   --  38.1* 34.9* 24.9*  HGB 8.6* 6.6* 6.2* 9.8* 9.4* 9.8* 9.3*  HCT 25.6* 19.8* 18.6* 28.4* 27.5* 28.9* 28.2*  MCV 90.1 90.8  --   --  87.6 90.0 92.2  PLT 200 187  --   --  149* 170 205    Cardiac Enzymes: No results for input(s): CKTOTAL, CKMB, CKMBINDEX, TROPONINI in the last 168 hours.  BNP: Invalid input(s): POCBNP  CBG: Recent Labs  Lab 05/28/20 1146 05/28/20 1546 05/28/20 1955 05/28/20 2339 05/29/20 0423  GLUCAP 78 116* 124* 117* 130*    Microbiology: Results for orders placed or performed during the hospital encounter of 05/14/2020  Resp Panel by RT-PCR (Flu A&B, Covid) Nasopharyngeal Swab     Status: None   Collection Time: 06/11/2020 12:44 PM   Specimen: Nasopharyngeal Swab; Nasopharyngeal(NP) swabs in vial transport medium  Result Value Ref Range Status   SARS Coronavirus 2 by RT PCR NEGATIVE NEGATIVE Final    Comment: (NOTE) SARS-CoV-2 target nucleic acids are NOT DETECTED.  The SARS-CoV-2 RNA is generally  detectable in upper respiratory specimens during the acute phase of infection. The lowest concentration of SARS-CoV-2 viral copies this assay can detect is 138 copies/mL. A negative result does not preclude SARS-Cov-2 infection and should not be used as the sole basis for treatment or other patient management decisions. A negative result may occur with  improper specimen collection/handling, submission of specimen other than nasopharyngeal swab, presence of viral mutation(s) within the areas targeted by this assay, and inadequate number of viral copies(<138  copies/mL). A negative result must be combined with clinical observations, patient history, and epidemiological information. The expected result is Negative.  Fact Sheet for Patients:  EntrepreneurPulse.com.au  Fact Sheet for Healthcare Providers:  IncredibleEmployment.be  This test is no t yet approved or cleared by the [place] FDA and  has been authorized for detection and/or diagnosis of SARS-CoV-2 by FDA under an Emergency Use Authorization (EUA). This EUA will remain  in effect (meaning this test can be used) for the duration of the COVID-19 declaration under Section 564(b)(1) of the Act, 21 U.S.C.section 360bbb-3(b)(1), unless the authorization is terminated  or revoked sooner.       Influenza A by PCR NEGATIVE NEGATIVE Final   Influenza B by PCR NEGATIVE NEGATIVE Final    Comment: (NOTE) The Xpert Xpress SARS-CoV-2/FLU/RSV plus assay is intended as an aid in the diagnosis of influenza from Nasopharyngeal swab specimens and should not be used as a sole basis for treatment. Nasal washings and aspirates are unacceptable for Xpert Xpress SARS-CoV-2/FLU/RSV testing.  Fact Sheet for Patients: EntrepreneurPulse.com.au  Fact Sheet for Healthcare Providers: IncredibleEmployment.be  This test is not yet approved or cleared by the [place] FDA and has been authorized for detection and/or diagnosis of SARS-CoV-2 by FDA under an Emergency Use Authorization (EUA). This EUA will remain in effect (meaning this test can be used) for the duration of the COVID-19 declaration under Section 564(b)(1) of the Act, 21 U.S.C. section 360bbb-3(b)(1), unless the authorization is terminated or revoked.  Performed at [facility], [place] [address], [place], [place] [address]   CSF culture w Gram Stain     Status: None   Collection Time: 05/26/2020  5:34 PM   Specimen: PATH Cytology CSF; Cerebrospinal Fluid   Result Value Ref Range Status   Specimen Description CSF  Final   Special Requests NONE  Final   Gram Stain   Final    WBC PRESENT,BOTH PMN AND MONONUCLEAR NO ORGANISMS SEEN CYTOSPIN SMEAR    Culture   Final    NO GROWTH 3 DAYS CORRECTED ON 03/14 AT 0941: PREVIOUSLY REPORTED AS NO GROWTH Performed at [facility], [place] [address] [place], [place] [address]    Report Status 05/27/2020 FINAL  Final  MRSA PCR Screening     Status: None   Collection Time: 05/19/20  1:49 AM   Specimen: Nasopharyngeal  Result Value Ref Range Status   MRSA by PCR NEGATIVE NEGATIVE Final    Comment:        The GeneXpert MRSA Assay (FDA approved for NASAL specimens only), is one component of a comprehensive MRSA colonization surveillance program. It is not intended to diagnose MRSA infection nor to guide or monitor treatment for MRSA infections. Performed at [facility], [place] [address] [place], [place] [address]   Culture, Respiratory w Gram Stain     Status: None   Collection Time: 05/21/20 12:17 PM   Specimen: Tracheal Aspirate; Respiratory  Result Value Ref Range Status  Specimen Description TRACHEAL ASPIRATE  Final   Special Requests NONE  Final   Gram Stain   Final    ABUNDANT WBC PRESENT, PREDOMINANTLY PMN ABUNDANT GRAM POSITIVE COCCI IN PAIRS FEW GRAM NEGATIVE COCCI FEW GRAM NEGATIVE RODS RARE GRAM POSITIVE RODS    Culture   Final    ABUNDANT STAPHYLOCOCCUS AUREUS WITHIN NORMAL RESPIRARTIRY FLORA Performed at [facility], [place] [address], [place], [place] [address]    Report Status 05/24/2020 FINAL  Final   Organism ID, Bacteria STAPHYLOCOCCUS AUREUS  Final      Susceptibility   Staphylococcus aureus - MIC*    CIPROFLOXACIN <=0.5 SENSITIVE Sensitive     ERYTHROMYCIN <=0.25 SENSITIVE Sensitive     GENTAMICIN <=0.5 SENSITIVE Sensitive     OXACILLIN 0.5 SENSITIVE Sensitive     TETRACYCLINE <=1 SENSITIVE Sensitive     VANCOMYCIN <=0.5 SENSITIVE Sensitive      TRIMETH/SULFA <=10 SENSITIVE Sensitive     CLINDAMYCIN <=0.25 SENSITIVE Sensitive     RIFAMPIN <=0.5 SENSITIVE Sensitive     Inducible Clindamycin NEGATIVE Sensitive     * ABUNDANT STAPHYLOCOCCUS AUREUS  Culture, blood (routine x 2)     Status: None (Preliminary result)   Collection Time: 05/25/20  8:00 AM   Specimen: BLOOD LEFT HAND  Result Value Ref Range Status   Specimen Description BLOOD LEFT HAND  Final   Special Requests   Final    BOTTLES DRAWN AEROBIC ONLY Blood Culture results may not be optimal due to an inadequate volume of blood received in culture bottles   Culture   Final    NO GROWTH 3 DAYS Performed at [facility], [place] [address], [place], [place] [address]    Report Status PENDING  Incomplete  Culture, blood (routine x 2)     Status: None (Preliminary result)   Collection Time: 05/25/20  8:15 AM   Specimen: BLOOD LEFT HAND  Result Value Ref Range Status   Specimen Description BLOOD LEFT HAND  Final   Special Requests   Final    BOTTLES DRAWN AEROBIC ONLY Blood Culture results may not be optimal due to an inadequate volume of blood received in culture bottles   Culture   Final    NO GROWTH 3 DAYS Performed at [facility], [place] [address], [place], [place] [address]    Report Status PENDING  Incomplete  Culture, Respiratory w Gram Stain     Status: None   Collection Time: 05/25/20 10:47 AM   Specimen: Tracheal Aspirate; Respiratory  Result Value Ref Range Status   Specimen Description TRACHEAL ASPIRATE  Final   Special Requests NONE  Final   Gram Stain   Final    ABUNDANT WBC PRESENT,BOTH PMN AND MONONUCLEAR ABUNDANT GRAM NEGATIVE RODS MODERATE GRAM POSITIVE COCCI IN PAIRS Performed at [facility], [place] [address] [place], [place] [address]    Culture   Final    ABUNDANT SERRATIA MARCESCENS ABUNDANT STAPHYLOCOCCUS AUREUS    Report Status 05/28/2020 FINAL  Final   Organism ID, Bacteria SERRATIA MARCESCENS  Final   Organism ID, Bacteria  STAPHYLOCOCCUS AUREUS  Final      Susceptibility   Staphylococcus aureus - MIC*    CIPROFLOXACIN <=0.5 SENSITIVE Sensitive     ERYTHROMYCIN <=0.25 SENSITIVE Sensitive     GENTAMICIN <=0.5 SENSITIVE Sensitive     OXACILLIN 0.5 SENSITIVE Sensitive     TETRACYCLINE <=1 SENSITIVE Sensitive     VANCOMYCIN <=0.5 SENSITIVE Sensitive  TRIMETH/SULFA <=10 SENSITIVE Sensitive     CLINDAMYCIN <=0.25 SENSITIVE Sensitive     RIFAMPIN <=0.5 SENSITIVE Sensitive     Inducible Clindamycin NEGATIVE Sensitive     * ABUNDANT STAPHYLOCOCCUS AUREUS   Serratia marcescens - MIC*    CEFAZOLIN >=64 RESISTANT Resistant     CEFEPIME <=0.12 SENSITIVE Sensitive     CEFTAZIDIME <=1 SENSITIVE Sensitive     CEFTRIAXONE <=0.25 SENSITIVE Sensitive     CIPROFLOXACIN <=0.25 SENSITIVE Sensitive     GENTAMICIN <=1 SENSITIVE Sensitive     TRIMETH/SULFA <=20 SENSITIVE Sensitive     * ABUNDANT SERRATIA MARCESCENS    Coagulation Studies: No results for input(s): LABPROT, INR in the last 72 hours.  Urinalysis: No results for input(s): COLORURINE, LABSPEC, PHURINE, GLUCOSEU, HGBUR, BILIRUBINUR, KETONESUR, PROTEINUR, UROBILINOGEN, NITRITE, LEUKOCYTESUR in the last 72 hours.  Invalid input(s): APPERANCEUR    Imaging: No results found.   Medications:     prismasol BGK 4/2.5 400 mL/hr at 05/29/20 0349    prismasol BGK 4/2.5 100 mL/hr at 05/28/20 0857   sodium chloride 10 mL/hr at 05/29/20 0600   sodium chloride 10 mL/hr at 05/29/20 0500   sodium chloride 10 mL/hr at 05/29/20 0600   cefTRIAXone (ROCEPHIN)  IV Stopped (05/28/20 1935)   feeding supplement (VITAL 1.5 CAL) 1,000 mL (05/28/20 1514)   fentaNYL infusion INTRAVENOUS Stopped (05/27/20 0548)   heparin 10,000 units/ 20 mL infusion syringe 600 Units/hr (05/29/20 0600)   levETIRAcetam Stopped (05/28/20 2125)   norepinephrine (LEVOPHED) Adult infusion Stopped (05/26/20 1256)   prismasol B22GK 4/0 1,800 mL/hr at 05/29/20 0342   vasopressin Stopped  (05/26/20 0733)    chlorhexidine gluconate (MEDLINE KIT)  15 mL Mouth Rinse BID   Chlorhexidine Gluconate Cloth  6 each Topical Daily   darbepoetin (ARANESP) injection - DIALYSIS  25 mcg Intravenous Q Tue-HD   docusate  100 mg Per Tube BID   feeding supplement (PROSource TF)  45 mL Per Tube QID   fentaNYL (SUBLIMAZE) injection  25 mcg Intravenous Once   insulin aspart  0-6 Units Subcutaneous Q4H   mouth rinse  15 mL Mouth Rinse 10 times per day   multivitamin  1 tablet Per Tube QHS   nystatin  5 mL Mouth/Throat QID   pantoprazole sodium  40 mg Per Tube Q1200   sodium chloride flush  10-40 mL Intracatheter Q12H   acetaminophen (TYLENOL) oral liquid 160 mg/5 mL, alteplase, fentaNYL, heparin, heparin, labetalol, polyethylene glycol, sodium chloride, sodium chloride flush  Assessment/ Plan:  OP HD: TTS AF  3h 61mn  75.5kg  2/2 bath Hep 7400  RIJ TDC Mircera 60 MCGq2wks last given 05/07/20  Venofer 50 weekly  Assessment/Plan 1. Shock / hypothermia - stabilizing improved blood pressures 2. HTN crisis/ PRES by MRI/ seizures: hx of missing HD/ chronic vol overload/ poor compliance. 2nd MRI 3/9+ worsening PRES. Pt was transitioned from iHD to CRRT 3/9 to avoid the large volume shifts of regular HD. BP's better now and off cleviprex.   3. Hypercalcemia - severe up to 13 today. Will use zero Ca++ bath w/ q 4 hr Ca labs, may need pamidronate as well. Suspect due to immobilization.   Calcium seems to be slowly improving. 4. ESRD - on HD since 2012. HD TTS.  CRRT started on 3/09.   continue CRRT 5. Vol overload: Patient being kept even on CRRT 6. Anemia of ESRD -Hgb 8's.  SP darbe 150ug on 3/08 here.  Will resume ESA. 7. Metabolic bone disease -hypercalcemia  have discontinued vitamin D and calcium acetate therapy.  0 calcium bath with CRRT at this time.  Close monitoring of calcium 8. Nutrition -ALB 3.3,currently n.p.o. 9. FTT - chronic SNF [facility].  10. GOC -  seen by palliative care. Given SNF dependence, hx of refusal of care and ++sig comorbidities would favor transition to comfort care.      LOS: [place] '@TODAY' '@6' :31 AM

## 2020-05-29 NOTE — Progress Notes (Addendum)
Palliative:  HPI: 65 y.o.femalewith past medical history of ESRD on HD, PVD, cervical cancer, chronic hypotension on midodrinewho was admitted on 3/5/2022with altered mental status and seizures. She was intubated for airway protection and diagnosed with PRES based on MRI findings. She was switched from IHD to CRRT in order to gain tighter control over her blood pressure. Repeat MRI on 3/9 showed worsening PRES. Early this 3/12 the patient's blood pressure dropped precipitously despite receiving bolus fluid, albumin and being on levophed.   I met today with daughter, Mary Wilcox. We had long discussion today regarding her mother's overall medical condition and wishes. Mary Wilcox shares that Mary Wilcox' has a strained social history and has seemingly isolated herself from most of her family. Mary Wilcox is tearful as she discusses the efforts she has made to support her mother and encourage her to take better care of herself over the years. Mary Wilcox does share that she feels that her mother was unhappy with previous QOL and she had the feeling that her mother was "giving up" ever since patient's mother died a few years ago. Mary Wilcox feels that her mother would not want to go through more than she already has medically and does not feel that she would desire trach/PEG. She feels her body is tired and cannot take much more. Mary Wilcox does not want her mother to continue to suffer and decision made to pursue one way extubation to full comfort care in the near future. Mary Wilcox requests time to discuss with her brother and let other family members know this is the plan. Will allow time for family to come to bedside to say goodbye if they wish to do so. Mary Wilcox will reach back out to me to discuss timing of transition to comfort care.   Update: Mary Wilcox called back and reports that she has family coming to visit from [place] this weekend to say goodbyes. Plans for transition to full comfort measures with extubation after  family visit (unsure the date/time but this weekend). If Mary Wilcox declines in the meantime Mary Wilcox is open to comfort measures sooner if needed and best for her mother.   All questions/concerns addressed. Therapeutic listening provided. Emotional support provided.   Exam: Fluctuating alertness. Able to open eyes and track at times. Able to wiggle toes to command at times but inconsistently. Withdraws to pain. Temporal muscle wasting. Tolerating vent. Cortrak in place. CRRT.   Plan: - No trach/PEG desired. - Will proceed with one way extubation to comfort care. Family discussing timing of transition.   [place], NP Palliative Medicine Team Pager [phone] (Please see [email or web address] for schedule) Team Phone [phone]    Greater than 50%  of this time was spent counseling and coordinating care related to the above assessment and plan

## 2020-05-29 NOTE — Progress Notes (Signed)
NAME:  Mary Wilcox, MRN:  [id number], DOB:  1956/02/23, LOS: 70 ADMISSION DATE:  06/01/2020, CONSULTATION DATE:  05/29/2020  REFERRING MD:  Shelly Coss, ED PA  CHIEF COMPLAINT: Altered mental status, seizure  Brief History:  65 year old with ESRD on dialysis, NHR presented with altered mental status and hypertension, had a seizure in the ED requiring intubation for airway protection  Past Medical History:  ESRD on HD T/TH/S PAD Hypothyroidism Cervical cancer 2006 status post radiation  Significant Hospital Events:  3/5 5 admitted with mental status change.  Had witnessed seizure while getting EEG requiring intubation during propofol load and Keppra load 3/6:MRI, pleated showing bilateral posterior cerebral cortical swelling and restricted diffusion fever and complicated posterior reversible encephalopathy syndrome or seizure phenomenon.  Got hypotensive during dialysis required left subclavian triple-lumen catheter placement.  Started on norepinephrine.  Got 1 unit blood. Stopped [place] heparin 3/7: EEG negative for seizure. GCS 3.  Weaning sedating medications.  Getting dialysis.  Weaning norepinephrine still hypotensive. core track for nutrition. got extra treatment of dialysis 3/8: EEG still negative for seizure but showing diffuse slowing.  Grimaces and opens eyes to noxious stimulus but still not following commands.  Failed pressure support attempts.  Sputum sent. Had to be started on Cleviprex as SBP > 160. Stopped acyclovir as HSV was neg. hgb drifted down gm no source of bleeding. Got another unit blood.  3/9 repeat MRI showing worsening PRES. BP better controlled on Cleviprex. Started labetolol VT in effort to wean celeviprex. Neuro marginally better. Actually opened eyes to voice.  3/10 BP has been better since starting Cleviprex and as needed labetalol.  Neuro exam somewhat unchanged from yesterday 3/12 Profound septic shock   Consults:  Renal Neurology  Procedures:  ETT 3/5 >> Lt  [place] CVL 3/6 >>  Significant Diagnostic Tests:  CT angio head/ neck >> stented left subclavian and axillary veins with stent occlusion MRI brain 3/5 >>Bilateral posterior cerebral cortical swelling and restricted diffusion, favor complicated PRES vs seizure LTM EEG 3/6 >> moderate diffuse encephalopathy CSF 3/5 >> normal   Micro Data:  CSF 3/5 >> ng Sputum Cx 3/12 >Serratia, Staph aureus   Antimicrobials:  Acyclovir (stopped 3/8) 3/10 Vancomycin > 3/14 3/12 Cefepime > 3/14 3/14 Zosyn > 3/14 3/14 CTX >   Interim History / Subjective:  Remains somewhat about the same from yesterday, intubated, opens eyes to voice, withdraws to noxious stimuli, Wiggles toes intermitently  Objective   Blood pressure 110/68, pulse 98, temperature 98.7 F (37.1 C), temperature source Oral, resp. rate (!) 27, height 4\' 11"  (1.499 m), weight 71.2 kg, SpO2 100 %. CVP:  [0 mmHg-11 mmHg] 2 mmHg  Vent Mode: PCV FiO2 (%):  [30 %] 30 % Set Rate:  [20 bmp] 20 bmp PEEP:  [5 cmH20] 5 cmH20 Plateau Pressure:  [15 cmH20-19 cmH20] 15 cmH20   Intake/Output Summary (Last 24 hours) at 05/29/2020 0962 Last data filed at 05/29/2020 0600 Gross per 24 hour  Intake 2813.79 ml  Output 2363 ml  Net 450.79 ml   Filed Weights   05/27/20 0446 05/28/20 0405 05/29/20 0500  Weight: 71.7 kg 72.2 kg 71.2 kg    Examination: Constitutional: Elderly appearing woman, appears comfortable on the ventilator Eyes: Open eyes to her name, unable to track eyes, rightward gaze, pupils equal and reactive to light Ears, nose, mouth, and throat: ETT in place  Cardiovascular: RRR, extremities warm to touch Respiratory: Breath sounds present, no wheezes, crackles, rhonchi Gastrointestinal: soft, +BS, rectal tube  in place Skin: No rashes, normal turgor, clubbing, distal fingers cyanotic Neurologic: Withdraws to painful stimuli, flexion of both upper and lower extremities to painful stimuli, wiggle toes when on voice command  at all 4  extremities, still continues to remain stuporous  Resolved Hospital Problem list   Hyperkalemia, resolved with urgent dialysis on 3/5 Hypotensive  Acute hyponatremia in setting of seizure-resolved  Assessment & Plan:   #Acute metabolic encephalopathy with seizure in the setting of PRES in addition to hypoxic-ischemic injury secondary to hypotension  #Hypertensive emergency on admission  Neurologically, there has been a slight improvement as she is now able to withdraw all 4 extremities to painful stimuli.  However, we cannot project how her quality of life or functional status will be 6 months from now.  There is a family meeting with the daughter today to discuss goals of care -BP remains at goal SBP < 140 -AEDs per neuro, appreciate assistance -Appreciate palliative medicine assistance with this difficult situation.  Several unsuccessful attempts to reach daughter but will continue the efforts to reach out to her to discuss trach/PEG/LTACH vs. Comfort  #3/12 Profound septic shock 2/2 MSSA and Serratia HCAP Has remained afebrile and leukocytosis is improving -Follow-up daily CBC -Follow-up fever curve -Ceftriaxone x5 doses  #Diarrhea Improved  #Acute respiratory failure in the setting of ineffective airway protection with seizure -Continue PC with above goal LTVV (not in ARDS and greatly reduces WOB in this mode)  -Will need trach depending on GOC  #ESRD-continue CRRT for now.  Monitor bicarb closely now that she is off sodium bicarb gtt. #Hypercalcemia-improving with CRRT  #Anemia-multifactorial from volume expansion, critical illness, anemia of chronic disease Stable -Follow daily CBC  Best practice (evaluated daily)  Diet: TF Pain/Anxiety/Delirium protocol (if indicated): Fentanyl VAP protocol (if indicated): Y DVT prophylaxis: SQ heparin GI prophylaxis: protonix Glucose control: SSI Mobility: Bed rest Disposition:ICU  Goals of Care:  Last date of multidisciplinary  goals of care discussion: Palliative able to reach [name] on 3/12 and Mary is now DNR.  Family meeting with daughter today to discuss goals of care.  If she elects for full scope of care, Ms. Wilcox will need tracheostomy/PEG/LTAC.  Nathanial Rancher, MD  PGY-3 Internal Medicine Teaching Service

## 2020-05-29 NOTE — Progress Notes (Signed)
Nutrition Brief Note  Chart reviewed.Reviewed palliative care note from today Plan for one-way extubation with transition to comfort care once family is ready.  TF continues for now as ordered, pt tolerating. No further nutrition interventions warranted at this time.  Please re-consult as needed.   Kerman Passey MS, RDN, LDN, CNSC Registered Dietitian III Clinical Nutrition RD Pager and On-Call Pager Number Located in [place]

## 2020-05-29 NOTE — Progress Notes (Signed)
Subjective: Nursing reports no significant changes.   Exam: Vitals:   05/29/20 0500 05/29/20 0600  BP: 102/85 110/68  Pulse:  98  Resp: (!) 29 (!) 27  Temp:    SpO2:  100%   Gen: In bed, NAD Resp: non-labored breathing, no acute distress Abd: soft, nt  Neuro: MS: Opens eyes to noxious sitmulation, does not engage the examiner. She does wiggle toes to command, but not reliably.  DX:IPJAS, eyes initially midline but with stimulation, move to the right slowly and remain there, corneals intact. Unclear blink to threat given frequent blinking Motor: withdrawal vs flexion in all 4 ext to noxious stimulation.  Sensory:grimaces to nox stim in bilateral upper extremities.   Pertinent Results: Cr 1.29  BP has been fairly stable other than one spike to 187 at 9pm  Impression: 65 year old female who presented with PRES who has unfortunately had issues with both sepsis and severely labile blood pressures. She did have an EEG recorded seizure. She has evidence of liver injury secondary to her shock, and I suspect that she has had some degree of hypoxic-ischemic injury as well.  She may have significant recovery over time, but I suspect it will be quite prolonged and possibly incomplete. Given her other medical issues, I think it is very reasonable to continue palliative discussions, but from a purely neurological perspective, I would favor continued observation for now.  Her eye deviation has been seen repeatedly and was captured on EEG without EEG correlate.    Recommendations: 1) continue keppra, but will reduce dose to 750mg  BID and once CRRT is change to intermittent HD, will need this changed to 1g daily with additional 500mg  after dialysis.  2) will follow.   Roland Rack, MD [facility] [phone]  If 7pm- 7am, please page neurology on call as listed in [place]. 05/29/2020  7:21 AM

## 2020-05-29 NOTE — Progress Notes (Signed)
Noted change to comfort care. Would continue keppra while able. Neurology will be available as needed for any questions.   Roland Rack, MD [facility] [phone]  If 7pm- 7am, please page neurology on call as listed in [place].

## 2020-05-30 DIAGNOSIS — J9601 Acute respiratory failure with hypoxia: Secondary | ICD-10-CM | POA: Diagnosis not present

## 2020-05-30 DIAGNOSIS — I6783 Posterior reversible encephalopathy syndrome: Secondary | ICD-10-CM | POA: Diagnosis not present

## 2020-05-30 DIAGNOSIS — Z7189 Other specified counseling: Secondary | ICD-10-CM | POA: Diagnosis not present

## 2020-05-30 DIAGNOSIS — Z515 Encounter for palliative care: Secondary | ICD-10-CM | POA: Diagnosis not present

## 2020-05-30 DIAGNOSIS — N186 End stage renal disease: Secondary | ICD-10-CM | POA: Diagnosis not present

## 2020-05-30 LAB — RENAL FUNCTION PANEL
Albumin: 2.1 g/dL — ABNORMAL LOW (ref 3.5–5.0)
Anion gap: 10 (ref 5–15)
BUN: 27 mg/dL — ABNORMAL HIGH (ref 8–23)
CO2: 20 mmol/L — ABNORMAL LOW (ref 22–32)
Calcium: 9.8 mg/dL (ref 8.9–10.3)
Chloride: 108 mmol/L (ref 98–111)
Creatinine, Ser: 1.37 mg/dL — ABNORMAL HIGH (ref 0.44–1.00)
GFR, Estimated: 43 mL/min — ABNORMAL LOW (ref >60)
Glucose, Bld: 169 mg/dL — ABNORMAL HIGH (ref 70–99)
Phosphorus: 4.9 mg/dL — ABNORMAL HIGH (ref 2.5–4.6)
Potassium: 4.5 mmol/L (ref 3.5–5.1)
Sodium: 138 mmol/L (ref 135–145)

## 2020-05-30 LAB — CULTURE, BLOOD (ROUTINE X 2)
Culture: NO GROWTH
Culture: NO GROWTH

## 2020-05-30 LAB — CBC
HCT: 30.9 % — ABNORMAL LOW (ref 36.0–46.0)
Hemoglobin: 9.7 g/dL — ABNORMAL LOW (ref 12.0–15.0)
MCH: 29.8 pg (ref 26.0–34.0)
MCHC: 31.4 g/dL (ref 30.0–36.0)
MCV: 94.8 fL (ref 80.0–100.0)
Platelets: 213 10*3/uL (ref 150–400)
RBC: 3.26 MIL/uL — ABNORMAL LOW (ref 3.87–5.11)
RDW: 19.1 % — ABNORMAL HIGH (ref 11.5–15.5)
WBC: 21.9 10*3/uL — ABNORMAL HIGH (ref 4.0–10.5)
nRBC: 6.6 % — ABNORMAL HIGH (ref 0.0–0.2)

## 2020-05-30 LAB — HEPATIC FUNCTION PANEL
ALT: 355 U/L — ABNORMAL HIGH (ref 0–44)
AST: 47 U/L — ABNORMAL HIGH (ref 15–41)
Albumin: 2.1 g/dL — ABNORMAL LOW (ref 3.5–5.0)
Alkaline Phosphatase: 147 U/L — ABNORMAL HIGH (ref 38–126)
Bilirubin, Direct: 0.2 mg/dL (ref 0.0–0.2)
Indirect Bilirubin: 0.6 mg/dL (ref 0.3–0.9)
Total Bilirubin: 0.8 mg/dL (ref 0.3–1.2)
Total Protein: 5.1 g/dL — ABNORMAL LOW (ref 6.5–8.1)

## 2020-05-30 LAB — GLUCOSE, CAPILLARY: Glucose-Capillary: 150 mg/dL — ABNORMAL HIGH (ref 70–99)

## 2020-05-30 LAB — CALCIUM
Calcium: 9.7 mg/dL (ref 8.9–10.3)
Calcium: 10.9 mg/dL — ABNORMAL HIGH (ref 8.9–10.3)

## 2020-05-30 LAB — MAGNESIUM: Magnesium: 2.7 mg/dL — ABNORMAL HIGH (ref 1.7–2.4)

## 2020-05-30 LAB — APTT: aPTT: 31 seconds (ref 24–36)

## 2020-05-30 MED ORDER — FENTANYL 2500MCG IN NS 250ML (10MCG/ML) PREMIX INFUSION
0.0000 ug/h | INTRAVENOUS | Status: DC
  Administered 2020-05-30: 25 ug/h via INTRAVENOUS
  Filled 2020-05-30: qty 250

## 2020-05-30 MED ORDER — FENTANYL BOLUS VIA INFUSION
50.0000 ug | INTRAVENOUS | Status: DC | PRN
  Filled 2020-05-30: qty 50

## 2020-05-30 MED ORDER — ZINC OXIDE 40 % EX OINT
1.0000 "application " | TOPICAL_OINTMENT | Freq: Three times a day (TID) | CUTANEOUS | Status: DC | PRN
  Filled 2020-05-30: qty 57

## 2020-05-30 MED ORDER — FENTANYL CITRATE (PF) 100 MCG/2ML IJ SOLN
50.0000 ug | INTRAMUSCULAR | Status: DC | PRN
Start: 2020-05-30 — End: 2020-05-30

## 2020-05-30 NOTE — Progress Notes (Signed)
NAME:  Mary Wilcox, MRN:  [id number], DOB:  Aug 11, 1955, LOS: 34 ADMISSION DATE:  06/08/2020, CONSULTATION DATE:  05/30/2020  REFERRING MD:  Shelly Coss, ED PA  CHIEF COMPLAINT: Altered mental status, seizure  Brief History:  65 year old with ESRD on dialysis, NHR presented with altered mental status and hypertension, had a seizure in the ED requiring intubation for airway protection  Past Medical History:  ESRD on HD T/TH/S PAD Hypothyroidism Cervical cancer 2006 status post radiation  Significant Hospital Events:  3/5 5 admitted with mental status change.  Had witnessed seizure while getting EEG requiring intubation during propofol load and Keppra load 3/6:MRI, pleated showing bilateral posterior cerebral cortical swelling and restricted diffusion fever and complicated posterior reversible encephalopathy syndrome or seizure phenomenon.  Got hypotensive during dialysis required left subclavian triple-lumen catheter placement.  Started on norepinephrine.  Got 1 unit blood. Stopped [place] heparin 3/7: EEG negative for seizure. GCS 3.  Weaning sedating medications.  Getting dialysis.  Weaning norepinephrine still hypotensive. core track for nutrition. got extra treatment of dialysis 3/8: EEG still negative for seizure but showing diffuse slowing.  Grimaces and opens eyes to noxious stimulus but still not following commands.  Failed pressure support attempts.  Sputum sent. Had to be started on Cleviprex as SBP > 160. Stopped acyclovir as HSV was neg. hgb drifted down gm no source of bleeding. Got another unit blood.  3/9 repeat MRI showing worsening PRES. BP better controlled on Cleviprex. Started labetolol VT in effort to wean celeviprex. Neuro marginally better. Actually opened eyes to voice.  3/10 BP has been better since starting Cleviprex and as needed labetalol.  Neuro exam somewhat unchanged from yesterday 3/12 Profound septic shock   Consults:  Renal Neurology  Procedures:  ETT 3/5 >> Lt   CVL 3/6 >>  Significant Diagnostic Tests:  CT angio head/ neck >> stented left subclavian and axillary veins with stent occlusion MRI brain 3/5 >>Bilateral posterior cerebral cortical swelling and restricted diffusion, favor complicated PRES vs seizure LTM EEG 3/6 >> moderate diffuse encephalopathy CSF 3/5 >> normal   Micro Data:  CSF 3/5 >> ng Sputum Cx 3/12 >Serratia, Staph aureus   Antimicrobials:  Acyclovir (stopped 3/8) 3/10 Vancomycin > 3/14 3/12 Cefepime > 3/14 3/14 Zosyn > 3/14 3/14 CTX >   Interim History / Subjective:  She appears comfortable in bed, remains encephalopathic  Objective   Blood pressure 103/87, pulse 96, temperature 98.2 F (36.8 C), temperature source Oral, resp. rate (!) 29, height 4\' 11"  (1.499 m), weight 71.3 kg, SpO2 100 %. CVP:  [1 mmHg-4 mmHg] 2 mmHg  Vent Mode: PCV FiO2 (%):  [30 %] 30 % Set Rate:  [20 bmp] 20 bmp PEEP:  [5 cmH20] 5 cmH20 Plateau Pressure:  [15 cmH20] 15 cmH20   Intake/Output Summary (Last 24 hours) at 05/30/2020 0622 Last data filed at 05/30/2020 0600 Gross per 24 hour  Intake 1632.74 ml  Output 1857 ml  Net -224.26 ml   Filed Weights   05/28/20 0405 05/29/20 0500 05/30/20 0420  Weight: 72.2 kg 71.2 kg 71.3 kg    Examination: Constitutional: Elderly appearing woman, appears comfortable on the ventilator Eyes: Open eyes to her name, unable to track eyes Ears, nose, mouth, and throat: ETT in place  Cardiovascular: RRR, extremities warm to touch Respiratory: Breath sounds present, no wheezes, crackles, rhonchi Gastrointestinal: soft, +BS, rectal tube in place Skin: No rashes, normal turgor, clubbing, distal fingers cyanotic still persistent Neurologic: Withdraws to painful stimuli, flexion of both  upper and lower extremities to painful stimuli, wiggle toes when on voice command   Resolved Hospital Problem list   Hyperkalemia, resolved with urgent dialysis on 3/5 Hypotensive  Acute hyponatremia in setting of  seizure-resolved  Assessment & Plan:   #Acute metabolic encephalopathy with seizure in the setting of PRES in addition to hypoxic-ischemic injury secondary to hypotension  #Hypertensive emergency on admission  Profound septic shock 2/2 MSSA and Serratia HCAP #Acute respiratory failure in the setting of ineffective airway protection with seizure #ESRD #Anemia-multifactorial from volume expansion, critical illness, anemia of chronic disease Really appreciate palliative medicine assistance with this difficult situation.  The family has elected not to proceed with tracheostomy, PEG or LTAC and would like to proceed to comfort care however the daughter is returning to other family members to decide on the time where they can all convene at bedside to embark on one-way extubation  Best practice (evaluated daily)  Diet: TF Pain/Anxiety/Delirium protocol (if indicated): Fentanyl VAP protocol (if indicated): Y DVT prophylaxis: SQ heparin GI prophylaxis: protonix Glucose control: SSI Mobility: Bed rest Disposition:ICU  Goals of Care:  Family has elected to proceed to comfort care however the daughter is talking to several family members to find a reasonable time for all of them together at bedside.  Nathanial Rancher, MD  PGY-3 Internal Medicine Teaching Service

## 2020-05-30 NOTE — Plan of Care (Signed)
  Problem: Clinical Measurements: Goal: Ability to maintain clinical measurements within normal limits will improve Outcome: Progressing Goal: Will remain free from infection Outcome: Progressing Goal: Diagnostic test results will improve Outcome: Progressing Goal: Respiratory complications will improve Outcome: Progressing Goal: Cardiovascular complication will be avoided Outcome: Progressing   Problem: Nutrition: Goal: Adequate nutrition will be maintained Outcome: Progressing   Problem: Coping: Goal: Level of anxiety will decrease Outcome: Progressing   Problem: Elimination: Goal: Will not experience complications related to bowel motility Outcome: Progressing Goal: Will not experience complications related to urinary retention Outcome: Progressing   Problem: Pain Managment: Goal: General experience of comfort will improve Outcome: Progressing   Problem: Safety: Goal: Ability to remain free from injury will improve Outcome: Progressing   Problem: Skin Integrity: Goal: Risk for impaired skin integrity will decrease Outcome: Progressing   Problem: Education: Goal: Knowledge of General Education information will improve Description: Including pain rating scale, medication(s)/side effects and non-pharmacologic comfort measures Outcome: Not Progressing   Problem: Health Behavior/Discharge Planning: Goal: Ability to manage health-related needs will improve Outcome: Not Progressing   Problem: Activity: Goal: Risk for activity intolerance will decrease Outcome: Not Progressing   

## 2020-05-30 NOTE — Progress Notes (Signed)
Palliative:  HPI:64 y.o.femalewith past medical history of ESRD on HD, PVD, cervical cancer, chronic hypotension on midodrinewho was admitted on 3/5/2022with altered mental status and seizures. She was intubated for airway protection and diagnosed with PRES based on MRI findings. She was switched from IHD to CRRT in order to gain tighter control over her blood pressure. Repeat MRI on 3/9 showed worsening PRES. Early this 3/12 the patient's blood pressure dropped precipitously despite receiving bolus fluid, albumin and being on levophed.   I met today at Mary Wilcox' bedside but no family present. Discussed with RN and plans for transition to comfort care sometime this weekend after family from [place] coming in to say goodbye. There was confusion regarding continuing CRRT per RN. I will call Mary Wilcox and readdress as timing is vague.   I called and spoke with Mary Wilcox. I explained that her mother is a little more alert today with eyes open but no other changes in status. Mary Wilcox holds strong in her desire to pursue comfort care. I suggested to her that we stop CRRT and tube feeding as this will not alter her mother's immediate prognosis. Mary Wilcox would like to speak with her brother about this before making any decisions. I encouraged her to consider stopping these interventions tomorrow and then we can continue ventilator support until all family able to say goodbyes.   All questions/concerns addressed. Emotional support provided.   Exam: Fluctuating alertness. Able to open eyes and track at times. Able to wiggle toes to command at times but inconsistently. Withdraws to pain. Temporal muscle wasting. Tolerating vent. Cortrak in place. CRRT.   Plan: - Plans for one way extubation to comfort this weekend. Mary Wilcox is unsure of timing of her family's arrival from out of town. Specific date/time of transition to be determined.  - I did suggest to [facility] that we stop CRRT and tube feeding in  preparation for transition as this will not alter her mother's immediate prognosis. She will discuss with her brother.   25 min  Vinie Sill, NP Palliative Medicine Team Pager [phone] (Please see [email or web address] for schedule) Team Phone [phone]    Greater than 50%  of this time was spent counseling and coordinating care related to the above assessment and plan

## 2020-05-30 NOTE — Progress Notes (Signed)
Noted change to comfort care.  Discontinue CRRT

## 2020-05-30 NOTE — Progress Notes (Signed)
[facility] Progress Note Patient Name: ADRINNE Wilcox DOB: April 06, 1955 MRN: [id number]   Date of Service  05/30/2020  HPI/Events of Note  Patient has a Flexiseal in place and the bedside RN is concerned about a possible recto-vaginal fistula, however patient is for terminal extubation this weekend per bedside RN.  eICU Interventions  Desitin cream to protect perineal skin, Wound Care consultation, will defer any consults for definitive assessment / Rx given terminal nature of plans for the patient.        Frederik Pear 05/30/2020, 9:23 PM

## 2020-05-31 DIAGNOSIS — J9601 Acute respiratory failure with hypoxia: Secondary | ICD-10-CM | POA: Diagnosis not present

## 2020-05-31 LAB — GLUCOSE, CAPILLARY
Glucose-Capillary: 113 mg/dL — ABNORMAL HIGH (ref 70–99)
Glucose-Capillary: 141 mg/dL — ABNORMAL HIGH (ref 70–99)
Glucose-Capillary: 146 mg/dL — ABNORMAL HIGH (ref 70–99)
Glucose-Capillary: 150 mg/dL — ABNORMAL HIGH (ref 70–99)
Glucose-Capillary: 153 mg/dL — ABNORMAL HIGH (ref 70–99)
Glucose-Capillary: 178 mg/dL — ABNORMAL HIGH (ref 70–99)
Glucose-Capillary: 136 mg/dL — ABNORMAL HIGH (ref 70–99)
Glucose-Capillary: 143 mg/dL — ABNORMAL HIGH (ref 70–99)
Glucose-Capillary: 154 mg/dL — ABNORMAL HIGH (ref 70–99)

## 2020-05-31 MED ORDER — ZINC OXIDE 40 % EX OINT
TOPICAL_OINTMENT | Freq: Three times a day (TID) | CUTANEOUS | Status: DC
  Filled 2020-05-31: qty 57

## 2020-06-14 NOTE — Progress Notes (Signed)
Patient NIBP 68/58, HR 80, no support Daughter notified of patient's current situation, as per daughter she may not be able to make it to the hospital today.

## 2020-06-14 NOTE — Progress Notes (Addendum)
NAME:  Mary Wilcox, MRN:  [id number], DOB:  03-25-1955, LOS: 52 ADMISSION DATE:  05/19/2020, CONSULTATION DATE:  06-05-2020  REFERRING MD:  Shelly Coss, ED PA  CHIEF COMPLAINT: Altered mental status, seizure  Brief History:  65 year old with ESRD on dialysis, NHR presented with altered mental status and hypertension, had a seizure in the ED requiring intubation for airway protection  Past Medical History:  ESRD on HD T/TH/S PAD Hypothyroidism Cervical cancer 2006 status post radiation  Significant Hospital Events:  3/5 5 admitted with mental status change.  Had witnessed seizure while getting EEG requiring intubation during propofol load and Keppra load 3/6:MRI, pleated showing bilateral posterior cerebral cortical swelling and restricted diffusion fever and complicated posterior reversible encephalopathy syndrome or seizure phenomenon.  Got hypotensive during dialysis required left subclavian triple-lumen catheter placement.  Started on norepinephrine.  Got 1 unit blood. Stopped [place] heparin 3/7: EEG negative for seizure. GCS 3.  Weaning sedating medications.  Getting dialysis.  Weaning norepinephrine still hypotensive. core track for nutrition. got extra treatment of dialysis 3/8: EEG still negative for seizure but showing diffuse slowing.  Grimaces and opens eyes to noxious stimulus but still not following commands.  Failed pressure support attempts.  Sputum sent. Had to be started on Cleviprex as SBP > 160. Stopped acyclovir as HSV was neg. hgb drifted down gm no source of bleeding. Got another unit blood.  3/9 repeat MRI showing worsening PRES. BP better controlled on Cleviprex. Started labetolol VT in effort to wean celeviprex. Neuro marginally better. Actually opened eyes to voice.  3/10 BP has been better since starting Cleviprex and as needed labetalol.  Neuro exam somewhat unchanged from yesterday 3/12 Profound septic shock   Consults:  Renal Neurology  Procedures:  ETT 3/5 >> Lt  [place] CVL 3/6 >>  Significant Diagnostic Tests:  CT angio head/ neck >> stented left subclavian and axillary veins with stent occlusion MRI brain 3/5 >>Bilateral posterior cerebral cortical swelling and restricted diffusion, favor complicated PRES vs seizure LTM EEG 3/6 >> moderate diffuse encephalopathy CSF 3/5 >> normal   Micro Data:  CSF 3/5 >> ng Sputum Cx 3/12 >Serratia, Staph aureus   Antimicrobials:  Acyclovir (stopped 3/8) 3/10 Vancomycin > 3/14 3/12 Cefepime > 3/14 3/14 Zosyn > 3/14 3/14 CTX >   Interim History / Subjective:   Continues to have poor mental status, no acute events  Objective   Blood pressure 117/68, pulse (!) 109, temperature (!) 100.7 F (38.2 C), temperature source Axillary, resp. rate 20, height 4\' 11"  (1.499 m), weight 71.1 kg, SpO2 100 %. CVP:  [1 mmHg-7 mmHg] 3 mmHg  Vent Mode: PCV FiO2 (%):  [30 %] 30 % Set Rate:  [20 bmp] 20 bmp PEEP:  [5 cmH20] 5 cmH20 Plateau Pressure:  [16 cmH20-18 cmH20] 17 cmH20   Intake/Output Summary (Last 24 hours) at 06/05/2020 0926 Last data filed at 06-05-2020 0700 Gross per 24 hour  Intake 1826.83 ml  Output 1500 ml  Net 326.83 ml   Filed Weights   05/29/20 0500 05/30/20 0420 June 05, 2020 0127  Weight: 71.2 kg 71.3 kg 71.1 kg    Examination: Gen:      No acute distress HEENT:  EOMI, sclera anicteric Neck:     No masses; no thyromegaly, ETT Lungs:    Clear to auscultation bilaterally; normal respiratory effort CV:         Regular rate and rhythm; no murmurs Abd:      + bowel sounds; soft, non-tender; no palpable  masses, no distension Ext:    No edema; adequate peripheral perfusion Skin:      Warm and dry; no rash Neuro: Sedated, unresponsive  No new labs or imaging  Resolved Hospital Problem list   Hyperkalemia, resolved with urgent dialysis on 3/5 Hypotensive  Acute hyponatremia in setting of seizure-resolved  Assessment & Plan:   #Acute metabolic encephalopathy with seizure in the setting of PRES  in addition to hypoxic-ischemic injury secondary to hypotension  #Hypertensive emergency on admission  Profound septic shock 2/2 MSSA and Serratia HCAP #Acute respiratory failure in the setting of ineffective airway protection with seizure #ESRD #Anemia-multifactorial from volume expansion, critical illness, anemia of chronic disease  Palliative care on board.  Family is planning withdrawal of care this weekend Continue ventilator support for now  Best practice (evaluated daily)  Diet: TF Pain/Anxiety/Delirium protocol (if indicated): Fentanyl VAP protocol (if indicated): Y DVT prophylaxis: SQ heparin GI prophylaxis: protonix Glucose control: SSI Mobility: Bed rest Disposition:ICU  Goals of Care:  Family has elected to proceed to comfort care however the daughter is talking to several family members to find a reasonable time for all of them together at bedside.  Marshell Garfinkel MD [facility] See Amion for pager  If no response to pager , please call [phone] until 7pm After 7:00 pm call Elink  [phone] Jun 24, 2020, 9:29 AM

## 2020-06-14 NOTE — Death Summary Note (Signed)
  DEATH SUMMARY   Patient Details  Name: Mary Wilcox MRN: [id number] DOB: 1955-08-02  Admission/Discharge Information   Admit Date:  May 20, 2020  Date of Death: Date of Death: 2020-06-02  Time of Death: Time of Death: 11-Apr-1633  Length of Stay: 03-30-2022  Referring Physician: Kristen Loader, FNP   Reason(s) for Hospitalization  Altered mental status, seizure  Diagnoses  Preliminary cause of death: Profound septic shock 2/2 MSSA and [place]. Sepsis present on admission Acute metabolic encephalopathy with seizure in the setting of PRES in addition to hypoxic-ischemic injury secondary to hypotension  Hypertensive emergency on admission  Acute respiratory failure in the setting of ineffective airway protection with seizure ESRD Anemia-multifactorial from volume expansion, critical illness, anemia of chronic disease   Secondary Diagnoses (including complications and co-morbidities):  Active Problems:   Seizure ([place])   Encounter for central line placement   Encounter for imaging study to confirm orogastric (OG) tube placement   ESRD on dialysis [facility])   Hypertensive emergency   PRES (posterior reversible encephalopathy syndrome)   Palliative care encounter   Acute respiratory failure with hypoxia [facility])   Brief Hospital Course (including significant findings, care, treatment, and services provided and events leading to death)  Mary Wilcox is a 65 y.o. year old female with ESRD on dialysis, NHR presented with altered mental status and hypertension, had a seizure in the ED requiring intubation for airway protection  3/5 5 admitted with mental status change.  Had witnessed seizure while getting EEG requiring intubation during propofol load and Keppra load 3/6:MRI, pleated showing bilateral posterior cerebral cortical swelling and restricted diffusion fever and complicated posterior reversible encephalopathy syndrome or seizure phenomenon.  Got hypotensive during dialysis required left  subclavian triple-lumen catheter placement.  Started on norepinephrine.  Got 1 unit blood. Stopped [place] heparin 3/7: EEG negative for seizure. GCS 3.  Weaning sedating medications.  Getting dialysis.  Weaning norepinephrine still hypotensive. core track for nutrition. got extra treatment of dialysis 3/8: EEG still negative for seizure but showing diffuse slowing.  Grimaces and opens eyes to noxious stimulus but still not following commands.  Failed pressure support attempts.  Sputum sent. Had to be started on Cleviprex as SBP > 160. Stopped acyclovir as HSV was neg. hgb drifted down gm no source of bleeding. Got another unit blood.  3/9 repeat MRI showing worsening PRES. BP better controlled on Cleviprex. Started labetolol VT in effort to wean celeviprex. Neuro marginally better. Actually opened eyes to voice.  3/10 BP has been better since starting Cleviprex and as needed labetalol.  Neuro exam somewhat unchanged from yesterday 3/12 Profound septic shock   Family met with palliative care on 3/12. After continued discussions the decision was made to transition to comfort measure after the family has a change to visit. CRRT was turned off. On 06-03-2022 she continued to deteriorate and passed away with PEA arrest.   Marshell Garfinkel MD [place] 06/10/2020, 2:08 PM

## 2020-06-14 NOTE — Progress Notes (Signed)
[facility] Progress Note   Assessment/ Plan:   OP HD: TTS AF 3h 44mn 75.5kg 2/2 bath Hep 7400 RIJ TDC Mircera 60 MCGq2wks last given 05/07/20  Venofer 50 weekly  Assessment/Plan 1. Shock / hypothermia -stabilizing improved blood pressures 2. HTN crisis/ PRES by MRI/ seizures: hx of missing HD/ chronic vol overload/ poor compliance.2ndMRI 3/9+worsening PRES. Pt was transitioned fromiHD to CRRT 3/9 to avoid the large volume shifts of regular HD. CRRT from 3/9--> 3/17, now transitioning to comfort care.  3. Hypercalcemia - improved, no longer checking labs 4. ESRD - on HD since 2012. HD TTS. CRRT started on 3/09-3/17/22 5. Vol overload:  6. Anemia of ESRD -Hgb 8's. SP darbe 150ug on 3/08 here.  Will resume ESA. 7. Metabolic bone disease -hypercalcemia have discontinued vitamin D and calcium acetate therapy.  8. Nutrition -ALB 3.3,currently n.p.o. 9. [facility].  Dispo- transitioning to comfort care with one-way extubation over the weekend.  We will sign off at this time.  Please let [email or web address] if we can be of any assistance.     Subjective:    Seen in room.  Transitioning to comfort care this weekend.  Off CRRT   Objective:   BP 117/68   Pulse (!) 109   Temp (!) 100.7 F (38.2 C) (Axillary)   Resp 20   Ht _0  (1.499 m)   Wt 71.1 kg   SpO2 100%   BMI 31.66 kg/m   Physical Exam: Gen: lying in bed, sort of a fixed gaze CVS: tachycardic Resp: mechanical bialterally Abd: soft Ext: in heel protectors ACCESS: +AVF  Labs: BMET Recent Labs  Lab 05/27/20 0435 05/27/20 0939 05/27/20 1537 05/27/20 1857 05/28/20 0306 05/28/20 0641 05/28/20 1536 05/28/20 1951 05/29/20 0423 05/29/20 0721 05/29/20 1217 05/29/20 1612 05/29/20 2050 05/30/20 0345 05/30/20 1020  NA 141  --  140  --  139  --  142  --  139  --   --  139  --  138  --   K 4.3  --  4.0  --  3.7  --  4.4  --  4.1  --   --  4.2  --  4.5  --    CL 107  --  107  --  106  --  110  --  109  --   --  112*  --  108  --   CO2 22  --  24  --  26  --  23  --  21*  --   --  21*  --  20*  --   GLUCOSE 167*  --  153*  --  136*  --  123*  --  142*  --   --  137*  --  169*  --   BUN 40*  --  33*  --  29*  --  26*  --  28*  --   --  23  --  27*  --   CREATININE 1.77*  --  1.50*  --  1.35*  --  1.34*  --  1.29*  --   --  1.22*  --  1.37*  --   CALCIUM 11.5*   < > 11.4*   < > 10.9*  10.9*   < > 11.9*  11.4*   < > 10.4* 9.9 9.9 9.9  10.0 10.8* 9.7  9.8 10.9*  PHOS 4.0  --  4.1  --  4.0  --  4.4  --  4.2  --   --  3.9  --  4.9*  --    < > = values in this interval not displayed.   CBC Recent Labs  Lab 05/27/20 0433 05/28/20 0306 05/29/20 0423 05/30/20 0345  WBC 38.1* 34.9* 24.9* 21.9*  HGB 9.4* 9.8* 9.3* 9.7*  HCT 27.5* 28.9* 28.2* 30.9*  MCV 87.6 90.0 92.2 94.8  PLT 149* 170 205 213      Medications:    . chlorhexidine gluconate (MEDLINE KIT)  15 mL Mouth Rinse BID  . Chlorhexidine Gluconate Cloth  6 each Topical Daily  . docusate  100 mg Per Tube BID  . feeding supplement (PROSource TF)  45 mL Per Tube QID  . insulin aspart  0-6 Units Subcutaneous Q4H  . liver oil-zinc oxide   Topical TID  . mouth rinse  15 mL Mouth Rinse 10 times per day  . multivitamin  1 tablet Per Tube QHS  . nystatin  5 mL Mouth/Throat QID  . pantoprazole sodium  40 mg Per Tube Q1200  . sodium chloride flush  10-40 mL Intracatheter Q12H     Madelon Lips, MD 06-21-20, 10:19 AM

## 2020-06-14 NOTE — Progress Notes (Signed)
1635hrs: Patient HR 0, confirmed by [employer] RN and Hurshel Party RN, daughter [name] notified attending Dr. Vaughan Browner notified

## 2020-06-14 NOTE — Consult Note (Signed)
WOC Nurse Consult Note: Reason for Consult: Consult requested for recto-vaginal fistula, Performed remotely after review of progress notes in the EMR.  Unfortunately, topical treatment will not be effective to treat this complicated medical condition and surgery would be the only intervention that would reverse this problem.  The patient is currently with comfort care goals. Topical treatment should be directed towards repelling moisture to perineum. Pt is already noted to have a Flexiseal in place to attempt to contain stool from the rectum.  Orders provided for bedside nurses as follows:Apply Desitin to perineum TID and PRN when cleaning. Please re-consult if further assistance is needed.  Thank-you,  Julien Girt MSN, [place], [place], [place], [place]

## 2020-06-14 DEATH — deceased
